# Patient Record
Sex: Female | Born: 1950 | Race: White | Hispanic: No | Marital: Married | Smoking: Never smoker
Health system: Southern US, Community
[De-identification: ages and names within clinical notes are randomized; demographics above are authoritative.]

## PROBLEM LIST (undated history)

## (undated) DIAGNOSIS — R112 Nausea with vomiting, unspecified: Secondary | ICD-10-CM

## (undated) DIAGNOSIS — Z9889 Other specified postprocedural states: Secondary | ICD-10-CM

## (undated) DIAGNOSIS — I10 Essential (primary) hypertension: Secondary | ICD-10-CM

## (undated) DIAGNOSIS — M659 Synovitis and tenosynovitis, unspecified: Secondary | ICD-10-CM

## (undated) DIAGNOSIS — R569 Unspecified convulsions: Secondary | ICD-10-CM

## (undated) DIAGNOSIS — Z972 Presence of dental prosthetic device (complete) (partial): Secondary | ICD-10-CM

## (undated) DIAGNOSIS — T8859XA Other complications of anesthesia, initial encounter: Secondary | ICD-10-CM

## (undated) DIAGNOSIS — G932 Benign intracranial hypertension: Secondary | ICD-10-CM

## (undated) DIAGNOSIS — D649 Anemia, unspecified: Secondary | ICD-10-CM

## (undated) DIAGNOSIS — T4145XA Adverse effect of unspecified anesthetic, initial encounter: Secondary | ICD-10-CM

## (undated) DIAGNOSIS — K219 Gastro-esophageal reflux disease without esophagitis: Secondary | ICD-10-CM

## (undated) DIAGNOSIS — Z8489 Family history of other specified conditions: Secondary | ICD-10-CM

## (undated) DIAGNOSIS — G629 Polyneuropathy, unspecified: Secondary | ICD-10-CM

## (undated) DIAGNOSIS — R51 Headache: Secondary | ICD-10-CM

## (undated) DIAGNOSIS — E7800 Pure hypercholesterolemia, unspecified: Secondary | ICD-10-CM

## (undated) HISTORY — DX: Gastro-esophageal reflux disease without esophagitis: K21.9

## (undated) HISTORY — DX: Essential (primary) hypertension: I10

## (undated) HISTORY — DX: Polyneuropathy, unspecified: G62.9

## (undated) HISTORY — PX: LUMBAR PERITONEAL SHUNT: SHX1984

## (undated) HISTORY — PX: APPENDECTOMY: SHX54

## (undated) HISTORY — PX: BLADDER SUSPENSION: SHX72

## (undated) HISTORY — DX: Presence of dental prosthetic device (complete) (partial): Z97.2

## (undated) HISTORY — PX: ESOPHAGEAL DILATION: SHX303

## (undated) HISTORY — PX: OVARIAN CYST REMOVAL: SHX89

## (undated) HISTORY — DX: Benign intracranial hypertension: G93.2

## (undated) HISTORY — DX: Headache: R51

---

## 1992-10-19 HISTORY — PX: TOTAL ABDOMINAL HYSTERECTOMY W/ BILATERAL SALPINGOOPHORECTOMY: SHX83

## 1999-07-23 ENCOUNTER — Encounter: Admission: RE | Admit: 1999-07-23 | Discharge: 1999-10-21 | Payer: Self-pay | Admitting: Geriatric Medicine

## 2000-02-06 ENCOUNTER — Encounter: Admission: RE | Admit: 2000-02-06 | Discharge: 2000-02-06 | Payer: Self-pay | Admitting: Geriatric Medicine

## 2000-02-06 ENCOUNTER — Encounter: Payer: Self-pay | Admitting: Geriatric Medicine

## 2000-06-16 ENCOUNTER — Encounter: Payer: Self-pay | Admitting: Geriatric Medicine

## 2000-06-16 ENCOUNTER — Encounter: Admission: RE | Admit: 2000-06-16 | Discharge: 2000-06-16 | Payer: Self-pay | Admitting: Geriatric Medicine

## 2000-06-17 ENCOUNTER — Other Ambulatory Visit: Admission: RE | Admit: 2000-06-17 | Discharge: 2000-06-17 | Payer: Self-pay | Admitting: *Deleted

## 2000-06-17 ENCOUNTER — Encounter: Payer: Self-pay | Admitting: Geriatric Medicine

## 2000-06-17 ENCOUNTER — Ambulatory Visit (HOSPITAL_COMMUNITY): Admission: RE | Admit: 2000-06-17 | Discharge: 2000-06-17 | Payer: Self-pay | Admitting: Geriatric Medicine

## 2000-10-14 ENCOUNTER — Encounter: Admission: RE | Admit: 2000-10-14 | Discharge: 2000-10-14 | Payer: Self-pay | Admitting: Geriatric Medicine

## 2000-10-14 ENCOUNTER — Encounter: Payer: Self-pay | Admitting: Geriatric Medicine

## 2000-12-28 ENCOUNTER — Encounter: Admission: RE | Admit: 2000-12-28 | Discharge: 2000-12-28 | Payer: Self-pay | Admitting: Geriatric Medicine

## 2000-12-28 ENCOUNTER — Encounter: Payer: Self-pay | Admitting: Geriatric Medicine

## 2001-02-10 ENCOUNTER — Inpatient Hospital Stay (HOSPITAL_COMMUNITY): Admission: AD | Admit: 2001-02-10 | Discharge: 2001-02-11 | Payer: Self-pay | Admitting: Geriatric Medicine

## 2001-02-10 ENCOUNTER — Encounter: Payer: Self-pay | Admitting: Internal Medicine

## 2001-02-11 ENCOUNTER — Encounter: Payer: Self-pay | Admitting: Geriatric Medicine

## 2001-12-22 ENCOUNTER — Encounter: Payer: Self-pay | Admitting: Geriatric Medicine

## 2001-12-22 ENCOUNTER — Ambulatory Visit (HOSPITAL_COMMUNITY): Admission: RE | Admit: 2001-12-22 | Discharge: 2001-12-22 | Payer: Self-pay | Admitting: Geriatric Medicine

## 2002-04-05 ENCOUNTER — Encounter: Payer: Self-pay | Admitting: Emergency Medicine

## 2002-04-05 ENCOUNTER — Emergency Department (HOSPITAL_COMMUNITY): Admission: EM | Admit: 2002-04-05 | Discharge: 2002-04-05 | Payer: Self-pay | Admitting: Emergency Medicine

## 2002-05-24 ENCOUNTER — Ambulatory Visit (HOSPITAL_BASED_OUTPATIENT_CLINIC_OR_DEPARTMENT_OTHER): Admission: RE | Admit: 2002-05-24 | Discharge: 2002-05-24 | Payer: Self-pay | Admitting: Orthopedic Surgery

## 2002-07-11 ENCOUNTER — Encounter: Admission: RE | Admit: 2002-07-11 | Discharge: 2002-07-11 | Payer: Self-pay | Admitting: *Deleted

## 2002-07-11 ENCOUNTER — Encounter: Payer: Self-pay | Admitting: *Deleted

## 2002-10-10 ENCOUNTER — Encounter: Payer: Self-pay | Admitting: *Deleted

## 2002-10-10 ENCOUNTER — Encounter: Admission: RE | Admit: 2002-10-10 | Discharge: 2002-10-10 | Payer: Self-pay | Admitting: *Deleted

## 2003-04-20 ENCOUNTER — Encounter: Payer: Self-pay | Admitting: Geriatric Medicine

## 2003-04-20 ENCOUNTER — Inpatient Hospital Stay (HOSPITAL_COMMUNITY): Admission: AD | Admit: 2003-04-20 | Discharge: 2003-04-21 | Payer: Self-pay | Admitting: Internal Medicine

## 2003-04-21 ENCOUNTER — Encounter: Payer: Self-pay | Admitting: Geriatric Medicine

## 2003-07-12 ENCOUNTER — Other Ambulatory Visit: Admission: RE | Admit: 2003-07-12 | Discharge: 2003-07-12 | Payer: Self-pay | Admitting: *Deleted

## 2003-08-10 ENCOUNTER — Encounter: Admission: RE | Admit: 2003-08-10 | Discharge: 2003-08-10 | Payer: Self-pay | Admitting: Geriatric Medicine

## 2003-08-10 ENCOUNTER — Encounter: Payer: Self-pay | Admitting: Geriatric Medicine

## 2004-04-06 ENCOUNTER — Encounter: Admission: RE | Admit: 2004-04-06 | Discharge: 2004-04-06 | Payer: Self-pay | Admitting: Geriatric Medicine

## 2004-08-05 ENCOUNTER — Ambulatory Visit (HOSPITAL_COMMUNITY): Admission: RE | Admit: 2004-08-05 | Discharge: 2004-08-05 | Payer: Self-pay | Admitting: Gastroenterology

## 2004-08-11 ENCOUNTER — Encounter: Admission: RE | Admit: 2004-08-11 | Discharge: 2004-08-11 | Payer: Self-pay | Admitting: Geriatric Medicine

## 2004-08-19 ENCOUNTER — Encounter: Admission: RE | Admit: 2004-08-19 | Discharge: 2004-08-19 | Payer: Self-pay | Admitting: *Deleted

## 2004-08-19 ENCOUNTER — Ambulatory Visit (HOSPITAL_COMMUNITY): Admission: RE | Admit: 2004-08-19 | Discharge: 2004-08-19 | Payer: Self-pay | Admitting: Gastroenterology

## 2004-09-01 ENCOUNTER — Encounter: Admission: RE | Admit: 2004-09-01 | Discharge: 2004-09-01 | Payer: Self-pay | Admitting: *Deleted

## 2005-06-29 ENCOUNTER — Other Ambulatory Visit: Admission: RE | Admit: 2005-06-29 | Discharge: 2005-06-29 | Payer: Self-pay | Admitting: Obstetrics and Gynecology

## 2005-10-19 HISTORY — PX: SHUNT REVISION: SHX343

## 2006-05-28 ENCOUNTER — Encounter: Admission: RE | Admit: 2006-05-28 | Discharge: 2006-05-28 | Payer: Self-pay | Admitting: Geriatric Medicine

## 2006-06-09 ENCOUNTER — Encounter: Admission: RE | Admit: 2006-06-09 | Discharge: 2006-06-09 | Payer: Self-pay | Admitting: Gastroenterology

## 2006-06-23 ENCOUNTER — Ambulatory Visit (HOSPITAL_COMMUNITY): Admission: RE | Admit: 2006-06-23 | Discharge: 2006-06-23 | Payer: Self-pay | Admitting: Gastroenterology

## 2007-05-31 ENCOUNTER — Encounter: Admission: RE | Admit: 2007-05-31 | Discharge: 2007-05-31 | Payer: Self-pay | Admitting: Geriatric Medicine

## 2007-08-01 ENCOUNTER — Encounter: Admission: RE | Admit: 2007-08-01 | Discharge: 2007-08-01 | Payer: Self-pay | Admitting: Gastroenterology

## 2007-09-05 ENCOUNTER — Ambulatory Visit (HOSPITAL_COMMUNITY): Admission: RE | Admit: 2007-09-05 | Discharge: 2007-09-05 | Payer: Self-pay | Admitting: Geriatric Medicine

## 2008-06-10 ENCOUNTER — Encounter: Admission: RE | Admit: 2008-06-10 | Discharge: 2008-06-10 | Payer: Self-pay | Admitting: Geriatric Medicine

## 2010-07-09 ENCOUNTER — Encounter: Admission: RE | Admit: 2010-07-09 | Discharge: 2010-07-09 | Payer: Self-pay | Admitting: Geriatric Medicine

## 2010-10-07 ENCOUNTER — Ambulatory Visit (HOSPITAL_COMMUNITY)
Admission: RE | Admit: 2010-10-07 | Discharge: 2010-10-07 | Payer: Self-pay | Source: Home / Self Care | Attending: Geriatric Medicine | Admitting: Geriatric Medicine

## 2011-03-06 NOTE — H&P (Signed)
[place]. [facility]  Patient:    [name], [name]                      MRN: [id number] Adm. Date:  02/10/01 Attending:  Thora Lance, M.D. Dictator:   Tillman Sers, N.P.                         History and Physical  DATE OF BIRTH:  [birth year].  CHIEF COMPLAINT:  Abdominal pain, nausea and vomiting.  HISTORY OF PRESENT ILLNESS:  [name] is a 60 year old white female patient of Dr. Ann Maki T. King who presents with a 3-1/2 day history of nausea, vomiting and abdominal pain. The pain is described as crampy, starting on the whole right side of the abdomen and extending to the left. It is not associated with any particular food. She has had no change in bowel movements. When she was seen in our office two days ago a KUB and lab work was unremarkable. She was advised to stay on clear liquids, use Levbid and Phenergan. It was felt that she had a viral gastroenteritis with spasms. The symptoms of nausea and vomiting, however, have worsened over the last 24 hours to the point that she has had no p.o. intake. Abdominal pain has pretty much stayed the same.  In the office she was found to be acutely tender in the abdomen on exam. Due to her symptoms and findings as well as complex medical history, she is being admitted for the same.  REVIEW OF SYSTEMS:  GENERAL:  She has had no fever or chills.  CHEST:  No pain, pressure or shortness of breath. ABDOMEN:  As above. GU:  No dysuria, frequency or hematuria. No change in bowel movements. PERIPHERAL:  No edema. NEUROLOGIC:  No headache, vision disturbances, numbness or any stroke symptoms.  PAST MEDICAL HISTORY: 1. Diabetes mellitus on oral agent. 2. Pseudotumor cerebri with peritoneal shunt. 3. Diverticulosis. 4. Urinary incontinence. 5. Obesity. 6. Severe GERD. 7. Hypertension.  PAST SURGICAL HISTORY: 1. Laparoscopic abdominal procedures for adhesions September 1992. 2. Appendectomy. 3. Ovarian  cyst removal. 4. TAH and BSO by Dr. Elliot Gault in 1994. 5. Bladder suspension procedure by Dr. Logan Bores in 1996. 6. Initial placement of CNS peritoneal shunt in summer of 1999    but had to redone due to adhesions.  FAMILY HISTORY:  Mother is 4 years of age and has multiple medical problems including DM, heart disease, hypertension, end stage Parkinsons and coronary artery disease. Her father of an MI at age 71.  SOCIAL HISTORY:  She is married and does not smoke. She rarely drinks ETOH.  CURRENT MEDICATIONS: 1. Prevacid 30 mg one a day. 2. Actos 30 mg one a day. 3. Glucotrol XL 10 mg one a day. 4. Norvasc 5 mg one a day. 5. Ortho-Est one a day. 6. Colace one p.o. q.d. p.r.n.  ALLERGIES:  CODEINE causes nausea, vomiting and hallucinations, PENICILLIN causes rash, TEGRETOL causes rash, ACCUPRIL also causes rash.  PHYSICAL EXAMINATION:  VITAL SIGNS:  Weight 187 which is stable, temperature 96.8, pulse 92, respirations 20, blood pressure 100/70. He is not orthostatic.  GENERAL:  She does appear mildly ill.  HEENT:  Eyes:  Funduscopic exam difficult. PERRLA. EOMs intact. Ear, nose and throat unremarkable.  NECK:  No JVD, no carotid bruit. Thyroid gland is not enlarged. No masses or irregularities palpated.  CARDIOVASCULAR:  Regular rate and rhythm without murmur or S3.  LUNGS:  Breath sounds are clear throughout.  ABDOMEN:  Soft, slightly distended. She is tender pretty much all over, worse in the right mid to lower quadrant. There is no rebound or guarding.  RECTAL:  Normal sphincter tone, stool is brown and heme negative.  NEUROLOGIC:  Cranial nerves II-XII intact. Gait steady. Speech appropriate. Nonfocal.  LABORATORY AND ACCESSORY DATA:  February 08, 2001:  Amylase 54, lipase 9.6, glucose 162, BUN 13.0, creatinine 0.6, sodium 140, potassium 3.9, chloride 98, CO2 29.0, calcium 9.3, protein 7.8, albumin 3.4, total bilirubin 0.3, alkaline phosphatase 210, SGOT 14, SGPT 32.  Hemoglobin 13.9, hematocrit 38.5, RBCs 4.76, MCV 91.0, RDW 36.0, platelet count 477,000 and white blood cell count 10.5 with neutrophils 84.2, lymphocytes 11.3.  KUB was unremarkable.  IMPRESSION:   Abdominal pain, nausea and vomiting. Complex medical history includes numerous abdominal surgeries.  PLAN:  Admit, n.p.o. status, IV hydration. CT scan of abdomen and pelvis STAT. Dr. Pete Glatter and associates to follow. DD:  02/10/01 TD:  02/11/01 Job: [id number]

## 2011-03-06 NOTE — H&P (Signed)
[place]. [facility]  Patient:    [name], [name]                      MRN: [id number] Adm. Date:  02/10/01 Attending:  Thora Lance, M.D. Dictator:   Tillman Sers, N.P.                         History and Physical  DATE OF BIRTH:  [birth year]  CHIEF COMPLAINT:  Abdominal pain, nausea and vomiting.  HISTORY OF PRESENT ILLNESS:  [name] is a 60 year old white female patient of Dr. Alice Rieger who presents with a 3-1/2-day history of nausea, vomiting, and abdominal pain.  The pain is described as crampy, starting on the whole right side of the abdomen and extending to the left.  It is not associated with any particular food.  She has had no change in bowel movements.  When she was seen in our office two days ago, a KUB and lab work were unremarkable. She was advised to stay on clear liquids, use Levbid and Phenergan.  It was felt she had a viral gastroenteritis and spasms.  Symptoms of nausea and vomiting, however, have worsened over the last 24 hours, to the point that she has had no p.o. intake.  Abdominal pain has pretty much stayed the same.  In the office, she was found to be acutely tender in the abdomen on exam.  Due to her symptoms and findings, as well as complex medical history, she is being admitted for the same.  REVIEW OF SYSTEMS:  GENERAL:  She has had no fever or chills.  CHEST:  No pain, pressure, or shortness of breath.  ABDOMEN:  As above.  GU:  No dysuria, frequency, hematuria.  No change in bowel movements.  PERIPHERAL:  No edema. NEUROLOGIC:  No headache, visual disturbances, numbness, or any stroke symptoms.  PAST MEDICAL HISTORY: 1. Diabetes mellitus, on oral agents. 2. Pseudotumor cerebri with peritoneal shunt. 3. Diverticulosis. 4. Urinary incontinence. 5. Obesity. 6. Severe GERD. 7. Hypertension.  PAST SURGICAL HISTORY:  Laparoscopic abdominal procedures for adhesions. September 1992 appendectomy.  Ovarian cyst  removed.  A TAH and BSO by Dr. Elliot Gault in 1994.  Bladder suspension procedure by Dr. Amil Amen in 1996. Initial placement of CNS peritoneal shunt in the summer of 1999 but had to be redone due to adhesions.  FAMILY HISTORY:  Mother is 68 years of age and has multiple medical problems including DM, heart disease, hypertension, end-stage Parkinsons, and coronary artery disease.  Her father died of an MI at age 54.  SOCIAL HISTORY:  She is married.  She does not smoke.  Rarely drinks ETOH.  MEDICATIONS: 1. Prevacid 30 mg 1 a day. 2. Actos 30 mg 1 a day. 3. Glucotrol XL 10 mg 1 a day. 4. Norvasc 5 mg 1 a day. 5. Ortho-Est 1 a day. 6. Colace 1 p.o. q.d. p.r.n.  ALLERGIES: 1. CODEINE causes nausea, vomiting, and hallucinations. 2. PENICILLIN causes rash. 3. TEGRETOL causes rash. 4. ACCUPRIL also causes rash.  PHYSICAL EXAMINATION:  VITAL SIGNS:  Weight 187 pounds, which is stable.  Temperature 96.8, pulse 92, respirations 20, blood pressure 100/70.  She is not orthostatic.  She does appear mildly ill.  HEENT:  Eyes:  Funduscopic exam is difficult.  PERRLA, EOMs intact.  Ear, nose, and throat:  Unremarkable.  NECK:  No JVD.  No carotid bruits.  Thyroid gland is not enlarged.  No masses or irregularities palpated.  HEART:  Regular rate and rhythm without murmur or S3.  LUNGS:  Breath sounds were clear throughout.  ABDOMEN:  Soft, slightly distended.  She is tender pretty much all over, worse in the right mid to lower quadrant.  There is no rebounding or guarding.  RECTAL:  Normal sphincter tone.  Stool is brown and heme negative.  NEUROLOGIC:  Cranial nerves II-XII intact.  Gait steady.  Speech appropriate. Nonfocal.  LABORATORY DATA:  April 23:  Amylase 54, lipase 9.6, glucose 162, BUN 13, creatinine 0.6, sodium 140, potassium 3.9, chloride 98, CO2 29, calcium 9.3, protein 7.8, albumin 3.4, total bilirubin 0.3, alkaline phosphatase 210, SGOT 14, SGPT 32.  Hemoglobin 13.9,  hematocrit 38.5, RBC 4.76, MCV 81, RDW 36, platelets 477,000, WBC 10,500 with neutrophils 84.2%, lymphocytes 11.3%.  KUB was unremarkable.  IMPRESSION:  Abdominal pain, nausea, and vomiting.  Complex medical history includes numerous abdominal surgeries.  PLAN:  Admit, n.p.o. status, IV hydration.  CT scan of the abdomen and pelvis stat.  Dr. Pete Glatter and associates to follow. DD:  02/10/01 TD:  02/11/01 Job: [id number]

## 2011-03-06 NOTE — Discharge Summary (Signed)
[place]. [facility]  Patient:    [name], [name]                    MRN: [id number] Adm. Date:  [id number] Disc. Date: [id number] Attending:  Ginette Otto                           Discharge Summary  ADMISSION DIAGNOSIS:  Abdominal pain, nausea, and vomiting.  DISCHARGE DIAGNOSES: 1. Constipation. 2. Diabetes mellitus. 3. Pseudotumor cerebri with peritoneal shunt. 4. Diverticulosis. 5. Urinary incontinence. 6. Obesity. 7. Gastroesophageal reflux disease. 8. Hypertension.  BRIEF HISTORY:  [name] is a very nice, 60 year old, white female who had been followed as an outpatient with a three and a half year history of nausea, vomiting, and abdominal pain.  She has had a crampy pain starting on the right side of her abdomen and then extending over to the left.  She has had KUB and laboratory work which has been normal.  She has been on a clear liquid diet. Over the last 24 hours, she has not been able to take in nutrition or liquids. The abdominal pain has been persistent.  PHYSICAL EXAMINATION:  Weight 187 pounds.  VITAL SIGNS:  Temperature 96.8 degrees, pulse 92, blood pressure 100/70.  HEENT:  Unremarkable.  LUNGS:  Clear.  HEART:  Regular rate and rhythm without murmur.  ABDOMEN:  Soft.  Slightly distended.  Tender diffusely, worse in the right mid to right lower quadrant.  LABORATORY DATA ON ADMISSION:  White blood cell count 11,700, hemoglobin 13.6. Pro time 12.5.  Sodium 141, potassium 3.9, chloride 104, bicarbonate 26, glucose 120, BUN 16, creatinine 0.7, calcium 9.3, total protein 7.0, albumin 3.6, AST 34, ALT 31, ALP 152, total bilirubin 1.2, amylase 55, lipase 15. Urinalysis with greater than 80 ketones, negative protein, nitrite negative.  HOSPITAL COURSE:  The patient was admitted to a regular hospital bed.  A KUB revealed obstipation and fecal impaction.  She was placed on an NG tube to low intermittent suction.  She  was given a Fleets enema.  Overnight she had significant results with virtual resolution of her nausea, vomiting, and abdominal pain.  She was then started on a regular diet and discharged later that afternoon.  CONDITION ON DISCHARGE:  Improved.  MEDICATIONS AT DISCHARGE: 1. Prevacid 30 mg a day. 2. Actos 30 mg a day. 3. Glucotrol XL 10 mg a day. 4. Norvasc 5 mg a day. 5. Ortho-Est one a day. 6. Colace 100 mg a day. 7. Milk of magnesia two tablespoons every day.  FOLLOW-UP:  She will be seen in the office in approximately three to four weeks. DD:  02/22/01 TD:  02/22/01 Job: [id number]

## 2011-03-06 NOTE — Op Note (Signed)
NAMEKENNETHA, PEARMAN             ACCOUNT NO.:  [id number]   MEDICAL RECORD NO.:  [id number]          PATIENT TYPE:  AMB   LOCATION:  ENDO                         FACILITY:  [facility]   PHYSICIAN:  Danise Edge, M.D.   DATE OF BIRTH:  Sep 16, 1951   DATE OF PROCEDURE:  08/05/2004  DATE OF DISCHARGE:                                 OPERATIVE REPORT   PROCEDURE:  Savary esophageal dilation.   PROCEDURE INDICATION:  Ms. [name] is a 60 year old female, born  10-Nov-1950.  [name] has chronic gastroesophageal reflux and  intermittently takes a proton pump inhibitor.  On July 09, 1999, she  underwent a barium swallow x-ray which revealed marked, spontaneous  gastroesophageal reflux to the level of the cricopharyngeus associated with  a small, reducible hiatal hernia.   [name] now describes episodes of dysphagia to both liquids and solids.  She experiences swallow-associated cough suggesting tracheal penetration as  well as esophageal dysphagia with regurgitation of recently swallowed food  or liquid with mucus.  Her symptoms have been present for about 3 months.  She has lost 20 pounds as a result of her swallowing difficulty.   MEDICATION ALLERGIES:  1.  CODEINE.  2.  PENICILLIN.  3.  TEGRETOL.  4.  ACCUPRIL.  5.  LIPITOR.  6.  ZETIA.  7.  ZOCOR.  8.  PRAVACHOL.  9.  CRESTOR.   PAST MEDICAL HISTORY:  1.  Pseudotumor cerebri.  2.  Ventriculoperitoneal shunt.  3.  Urinary incontinence.  4.  Colonic diverticulosis by flexible proctosigmoidoscopy - barium enema in      1999.  5.  Type 2 diabetes mellitus.  6.  Hypercholesterolemia.  7.  Hypertension.  8.  Appendectomy.  9.  Ovarian cystectomy.  10. Laparoscopic adhesion lysis.  11. Total abdominal hysterectomy with bilateral salpingo-oophorectomy.  12. Bladder suspension surgery.   FAMILY HISTORY:  Negative for colon cancer.   HABITS:  [name] does not smoke cigarettes and rarely consumes  alcohol.   LABORATORY DATA:  May 30, 2004, hemoglobin A1c 7%.  Complete metabolic  profile normal.  Urinalysis normal.   ENDOSCOPIST:  Danise Edge, MD   PREMEDICATION:  1.  Versed 7.5 mg.  2.  Demerol 50 mg.   DESCRIPTION OF PROCEDURE:  After obtaining informed consent, [name] was  placed in the left lateral decubitus position.  I administered intravenous  Demerol and intravenous Versed to achieve conscious sedation for the  procedure.  The patient's blood pressure, oxygen saturation, and cardiac  rhythm were monitored throughout the procedure and documented in the medical  record.   The Olympus gastroscope was passed through the posterior hypopharynx into  the proximal esophagus without difficulty.  The hypopharynx and larynx  appeared normal.  I did not get a good look at the vocal cords.   ESOPHAGOSCOPY:  The proximal and mid segments of the esophagus appear  normal.  There is a shallow Schatzki's ring at the esophagogastric junction.  There is no endoscopic evidence for the presence of esophageal erosions or  Barrett's esophagus.   GASTROSCOPY:  Retroflexed view of the  gastric cardia and fundus was normal.  The gastric body, antrum, and pylorus appeared normal.   DUODENOSCOPY:  The duodenal bulb and descending duodenum appear normal.   SAVARY ESOPHAGEAL DILATION:  The Savary dilator wire was passed through the  endoscope and the tip of the guidewire advanced to the distal gastric  antrum.  Under fluoroscopic guidance, the 15 mm Savary dilator passed  without resistance.  Repeat esophagogastroscopy confirmed satisfactory  dilation of the shallow Schatzki's ring at the esophagogastric junction and  no gastric trauma due to the guidewire.   ASSESSMENT:  A shallow Schatzki's ring was dilated at the esophagogastric  junction; otherwise, normal esophagogastroduodenoscopy.   RECOMMENDATIONS:  I do not think the dilated shallow Schatzki's ring at the  esophagogastric  junction completely explains [name]'s swallowing  difficulty.  I remain concerned that she may have transfer dysphagia  associated with tracheal penetration of swallowed liquids.  I will arrange a  swallowing study through speech pathology at [facility].      MJ/MEDQ  D:  08/05/2004  T:  08/05/2004  Job:  [id number]   cc:   Hal T. Stoneking, M.D.  [address] [place], [place] [address]  Fax: [phone]

## 2011-03-06 NOTE — H&P (Signed)
[name], [name]                       ACCOUNT NO.:  [id number]   MEDICAL RECORD NO.:  [id number]                   PATIENT TYPE:  INP   LOCATION:  4738                                 FACILITY:  [facility]   PHYSICIAN:  Jackie Plum, M.D.             DATE OF BIRTH:  April 21, 1951   DATE OF ADMISSION:  04/20/2003  DATE OF DISCHARGE:                                HISTORY & PHYSICAL   CHIEF COMPLAINT:  Chest pain centrally, associated with right arm pain and  tingling.  Also has cough, wheezing, and dizziness.   HISTORY OF PRESENT ILLNESS:  After usual housework Wednesday morning, which  was vacuuming, making beds, and folding laundry, she developed right arm  tingling.  While walking about the house, developed crushing chest pain  centrally associated with cough, wheezing, shortness of breath, dizziness,  some sweats, and nausea.  She produced sputum that was white.  She has no  fever or chills.  She rested on the couch the rest to the evening.  The  chest pain persisted though was somewhat better.  She reported today for  further workup.   EKG changes in lead III led to the decision to admit to rule out MI.   PAST MEDICAL HISTORY:  1. Pseudotumor cerebri July 1999.  2. Fibrocystic breast disease.  3. Diabetes.  4. Hypertension.  5. Diverticulosis with flexible sigmoidoscopy February 1999.  6. Syncope with negative workup resulting in diagnosis of pseudotumor     cerebri.   MEDICATIONS:  1. Actos 30 mg 1 daily.  2. Prevacid 30 mg 1 daily.  3. Glucotrol XL 10 mg 1 daily.  4. Norvasc 5 mg 1 daily.  5. Ortho-Est 1 daily.  6. Colace 1 daily.  7. Glucophage XR 1000 mg 2 q.h.s.  8. Zetia 10 mg 1 daily.   ALLERGIES:  CODEINE, PENICILLIN, TEGRETOL, and ACCUPRIL.   PAST SURGICAL HISTORY:  1. Appendectomy.  2. Ovarian cyst.  3. Laparoscopic abdominal procedure for adhesions September 1992.  4. Total abdominal hysterectomy and bilateral salpingo-oophorectomy by Dr.  Elliot Gault in 1994.  5. Bladder suspension procedure, Dr. Logan Bores in 1996.  6. Placement of CNS peritoneal shunt summer of 1999  7. Redone due to adhesions.   FAMILY HISTORY:  Mother died at age 58 of end-stage Parkinson's disease.  She also had diabetes, hypertension, and heart disease.  Father died at age  48 of an MI.  One sister is in good health at age 94.  One brother, age 40,  has high blood pressure.  She has two children in good health.   SOCIAL HISTORY:  She is married.  Denies smoking.  Rare alcohol.  She is  disabled from [employer].   REVIEW OF SYSTEMS:  See Chief Complaint.   PHYSICAL EXAMINATION:  GENERAL:  Obese white female in no acute distress.  VITAL SIGNS:  Weight not done.  Temperature 97.3, pulse 72, blood pressure  130/90  with large cuff.  Pulse oximetry 95%.  SKIN:  Normal.  HEENT:  Eyes: PERRLA. Ears clear.  Nose and throat clear.  She is  edentulous.  NECK:  Supple with no thyroid enlargement or tenderness.  CHEST:  Clear to auscultation but tender to palpation centrally.  HEART:  Normal S1 and S2 with no murmurs, rubs, gallops, or clicks.  ABDOMEN:  Tender in the epigastric area.  She has positive bowel sounds  throughout.  No masses or organomegaly.  GYN/RECTAL:  Not indicated.  EXTREMITIES:  No edema, cyanosis, or clubbing.   LABORATORY DATA:  Includes EKG with changes in lead III.   ASSESSMENT:  Chest pain; however, rule out myocardial infarction.   PLAN:  Admit for further evaluation and treatment.  Consult with Dr. Merlene Laughter.     Lavinia Sharps, N.P.                     Jackie Plum, M.D.    MAP/MEDQ  D:  04/20/2003  T:  04/21/2003  Job:  [id number]

## 2011-03-06 NOTE — Op Note (Signed)
[name], [name]             ACCOUNT NO.:  [id number]   MEDICAL RECORD NO.:  [id number]          PATIENT TYPE:  AMB   LOCATION:  ENDO                         FACILITY:  [facility]   PHYSICIAN:  Danise Edge, M.D.   DATE OF BIRTH:  05-Aug-1951   DATE OF PROCEDURE:  06/23/2006  DATE OF DISCHARGE:                                 OPERATIVE REPORT   SAVARY ESOPHAGEAL DILATION REPORT:   PROCEDURE INDICATION:  [name] is a 60 year old female, born  10-21-1950.  [name] has a symptomatic Schatzki's ring at the  esophagogastric junction, and is scheduled for repeat esophageal dilation.  She last underwent dilation to 15-mm Savary on August 05, 2004.   PAST MEDICAL HISTORY:  1. Pseudotumor cerebri.  2. Hypertension.  3. Ventriculoperitoneal shunt.  4. Urinary incontinence.  5. Colonic diverticulosis by flexible proctosigmoidoscopy - barium enema      1999.  6. Type 2 diabetes mellitus.  7. Hypercholesterolemia.  8. Appendectomy.  9. Ovarian cystectomy.  10.Laparoscopic adhesion lysis.  11.Total abdominal hysterectomy with bilateral salpingo-oophorectomy.  12.Bladder suspension surgery.   FAMILY HISTORY:  Negative for colon cancer.   HABITS:  [name] does not smoke cigarettes and rarely consumes alcohol.   ENDOSCOPIST:  Dr. Reece Agar.   PREMEDICATION:  Versed 7 mg, Demerol 75 mcg.   PROCEDURE:  After obtaining informed consent, [name] was placed in the  left lateral decubitus position.  I administered intravenous fentanyl and  intravenous Versed to achieve conscious sedation for the procedure.  The  patient's blood pressure, oxygen saturation and cardiac rhythm were  monitored throughout the procedure and documented in the medical record.   The Olympus gastroscope was passed through the posterior hypopharynx and  into the proximal esophagus without difficulty.  The hypopharynx, larynx and  vocal cords appeared normal.   ESOPHAGOSCOPY:  The  proximal, mid and lower segments of the esophageal  mucosa appeared normal.  There is a Schatzki's ring at the esophagogastric  junction, and a moderate-sized hiatal hernia distally.  There is no  endoscopic evidence for the presence of erosive esophagitis or Barrett's  esophagus.   GASTROSCOPY:  Retroflex view of the gastric cardia and fundus was normal.  The diaphragmatic hiatus is only slightly patulous.  The gastric body,  antrum and pylorus appeared normal.   DUODENOSCOPY:  The duodenal bulb and second portion of the duodenum appear  normal.   SAVARY ESOPHAGEAL DILATION:  The Savary dilator wire was passed through the  gastroscope, and tip of the guidewire advanced to the distal gastric antrum  as confirmed endoscopically.  Fluoroscopy was not required for the procedure  today.  The 16-mm Savary dilator passed without resistance.  Repeat  esophagogastrostomy confirmed satisfactory dilation of the Schatzki's ring  at the esophagogastric junction, and no gastric trauma due to the guidewire.   ASSESSMENT:  Schatzki's ring at the esophagogastric junction associated with  a moderate-sized hiatal hernia, dilated with the 16-mm Savary dilator.           ______________________________  Danise Edge, M.D.     MJ/MEDQ  D:  06/23/2006  T:  06/23/2006  Job:  [id number]   cc:   Hal T. Stoneking, M.D.

## 2011-03-16 ENCOUNTER — Other Ambulatory Visit (HOSPITAL_BASED_OUTPATIENT_CLINIC_OR_DEPARTMENT_OTHER): Payer: Self-pay | Admitting: Family Medicine

## 2011-03-17 ENCOUNTER — Ambulatory Visit
Admission: RE | Admit: 2011-03-17 | Discharge: 2011-03-17 | Disposition: A | Discharge status: Home / Self Care | Payer: Managed Care, Other (non HMO) | Source: Ambulatory Visit | Attending: Internal Medicine | Admitting: Internal Medicine

## 2011-03-17 ENCOUNTER — Other Ambulatory Visit: Payer: Self-pay | Admitting: Internal Medicine

## 2011-03-17 DIAGNOSIS — R319 Hematuria, unspecified: Secondary | ICD-10-CM

## 2011-04-13 ENCOUNTER — Other Ambulatory Visit: Payer: Self-pay | Admitting: Geriatric Medicine

## 2011-04-13 DIAGNOSIS — Z1231 Encounter for screening mammogram for malignant neoplasm of breast: Secondary | ICD-10-CM

## 2011-04-20 ENCOUNTER — Other Ambulatory Visit: Payer: Self-pay | Admitting: Geriatric Medicine

## 2011-04-20 DIAGNOSIS — N6009 Solitary cyst of unspecified breast: Secondary | ICD-10-CM

## 2011-04-21 ENCOUNTER — Other Ambulatory Visit: Payer: Self-pay | Admitting: Geriatric Medicine

## 2011-04-21 ENCOUNTER — Ambulatory Visit
Admission: RE | Admit: 2011-04-21 | Discharge: 2011-04-21 | Disposition: A | Discharge status: Home / Self Care | Payer: Managed Care, Other (non HMO) | Source: Ambulatory Visit | Attending: Geriatric Medicine | Admitting: Geriatric Medicine

## 2011-04-21 DIAGNOSIS — N6009 Solitary cyst of unspecified breast: Secondary | ICD-10-CM

## 2011-04-24 ENCOUNTER — Other Ambulatory Visit: Payer: Self-pay | Admitting: Diagnostic Radiology

## 2011-04-24 ENCOUNTER — Other Ambulatory Visit: Payer: Self-pay | Admitting: Geriatric Medicine

## 2011-04-24 ENCOUNTER — Ambulatory Visit
Admission: RE | Admit: 2011-04-24 | Discharge: 2011-04-24 | Disposition: A | Discharge status: Home / Self Care | Payer: Managed Care, Other (non HMO) | Source: Ambulatory Visit | Attending: Geriatric Medicine | Admitting: Geriatric Medicine

## 2011-04-24 DIAGNOSIS — N6009 Solitary cyst of unspecified breast: Secondary | ICD-10-CM

## 2011-04-27 ENCOUNTER — Ambulatory Visit
Admission: RE | Admit: 2011-04-27 | Discharge: 2011-04-27 | Disposition: A | Discharge status: Home / Self Care | Payer: Managed Care, Other (non HMO) | Source: Ambulatory Visit | Attending: Geriatric Medicine | Admitting: Geriatric Medicine

## 2011-04-27 ENCOUNTER — Ambulatory Visit: Payer: Managed Care, Other (non HMO)

## 2011-04-27 DIAGNOSIS — N6009 Solitary cyst of unspecified breast: Secondary | ICD-10-CM

## 2011-05-04 ENCOUNTER — Encounter (INDEPENDENT_AMBULATORY_CARE_PROVIDER_SITE_OTHER): Payer: Self-pay | Admitting: Surgery

## 2011-05-05 ENCOUNTER — Ambulatory Visit (INDEPENDENT_AMBULATORY_CARE_PROVIDER_SITE_OTHER): Payer: Managed Care, Other (non HMO) | Admitting: Surgery

## 2011-05-05 ENCOUNTER — Encounter (INDEPENDENT_AMBULATORY_CARE_PROVIDER_SITE_OTHER): Payer: Self-pay | Admitting: Surgery

## 2011-05-05 VITALS — BP 118/76 | HR 70 | Temp 97.4°F | Ht 62.0 in | Wt 170.0 lb

## 2011-05-05 DIAGNOSIS — N63 Unspecified lump in unspecified breast: Secondary | ICD-10-CM

## 2011-05-05 DIAGNOSIS — N6320 Unspecified lump in the left breast, unspecified quadrant: Secondary | ICD-10-CM | POA: Insufficient documentation

## 2011-05-05 NOTE — Patient Instructions (Signed)
Please call your neurologist (Dr. Lilian Kapur) and discuss your upcoming surgery with him.  If he has any concerns about your surgery and the required general anesthetic, please let us know so we can refer you to [facility].  If there are no concerns, please call my nurse - Pattricia Boss - and let her know so we can schedule your surgery.

## 2011-05-05 NOTE — Progress Notes (Addendum)
Chief Complaint  Patient presents with  . Other    eval Lt br palpilloma    HPI HPIThe patient is a 60 year old female with significant medical problems who last underwent a mammogram in October 2005. 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 Recently she developed a tender red spot on her left breast just above the nipple. Dr. Pete Glatter referred her for mammogram and ultrasound on April 21, 2011. This showed that the skin lesion was confined only to the skin with no deep invasion. A previously seen right breast mass has resolved and the patient only has some mild right duct ectasia. On the left side, she has extensive upper outer quadrant duct ectasia with complex intraductal mass at 12:30. She underwent ultrasound-guided core biopsy of this mass on July 6. 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 A clip was placed. The patient is now referred for surgical evaluation.   Past Medical History  Diagnosis Date  . Diabetes mellitus   . Peripheral neuropathy   . Hypertension   . Hyperlipidemia   . GERD (gastroesophageal reflux disease)   . Fatigue   . Breast pain   . Lump   . Wears dentures   . Kidney disease   . Dizziness   . Headache   Pseudotumor cerebri - This is managed by Dr. Lew Dawes at [facility] neurology. All of her previous neurosurgery was performed at [facility], but no neurosurgeons remain there. Previously she was unable to have surgery here in [place] due to her anesthetic risks.   Past Surgical History  Procedure Date  . Appendectomy   . Bladder tack   . Lumbar peritoneal shunt   . Total abdominal hysterectomy w/ bilateral salpingoophorectomy     Family History  Problem Relation Age of Onset  . Heart disease Father     Social History History  Substance Use Topics  . Smoking status: Never Smoker   . Smokeless  tobacco: Not on file  . Alcohol Use: No    Allergies  Allergen Reactions  . Carbamazepine   . Penicillins   . Statins   . Tricyclic Antidepressants     Current Outpatient Prescriptions  Medication Sig Dispense Refill  . amLODipine (NORVASC) 10 MG tablet Take 5 mg by mouth daily.        Marland Kitchen aspirin 81 MG tablet Take 81 mg by mouth daily.        Marland Kitchen b complex vitamins tablet Take 1 tablet by mouth daily.        . capsicum (ZOSTRIX) 0.075 % topical cream Apply 1 application topically 3 (three) times daily.        . ciprofloxacin (CIPRO) 500 MG/5ML (10%) suspension Take by mouth 2 (two) times daily.        . cloNIDine (CATAPRES) 0.3 MG tablet Take 0.3 mg by mouth 2 (two) times daily.        . cyclobenzaprine (FLEXERIL) 10 MG tablet Take 10 mg by mouth 3 (three) times daily as needed.        . diclofenac (VOLTAREN) 0.1 % ophthalmic solution 1 drop as needed.        . divalproex (DEPAKOTE) 250 MG 24 hr tablet Take 250 mg by mouth Nightly.        . esomeprazole (NEXIUM) 40 MG capsule Take 40 mg by mouth as needed.        Marland Kitchen estradiol (ESTRACE) 1  MG tablet Take 1.5 mg by mouth daily.        Marland Kitchen estropipate (OGEN) 1.5 MG tablet Take 1.5 mg by mouth daily.        . furosemide (LASIX) 20 MG tablet Take 20 mg by mouth daily.        . furosemide (LASIX) 20 MG tablet Take 20 mg by mouth 2 (two) times daily.        Marland Kitchen glipiZIDE (GLUCOTROL) 10 MG tablet Take 10 mg by mouth 2 (two) times daily before a meal.        . HYDROcodone-acetaminophen (VICODIN) 5-500 MG per tablet Take 1 tablet by mouth as needed.        . insulin glargine (LANTUS) 100 UNIT/ML injection Inject 14 Units into the skin at bedtime.        Marland Kitchen ketoconazole (NIZORAL) 2 % cream Apply topically daily.        . metFORMIN (GLUMETZA) 500 MG (MOD) 24 hr tablet Take 1,000 mg by mouth daily with breakfast.        . metoprolol (TOPROL-XL) 100 MG 24 hr tablet Take 100 mg by mouth daily.        . metoprolol (TOPROL-XL) 50 MG 24 hr tablet Take 50 mg by  mouth daily.        . metroNIDAZOLE (FLAGYL) 500 MG tablet Take 500 mg by mouth 3 (three) times daily.        Marland Kitchen omeprazole (PRILOSEC) 20 MG capsule Take 20 mg by mouth daily.        . Tamsulosin HCl (FLOMAX) 0.4 MG CAPS Take 0.4 mg by mouth daily.        Marland Kitchen telmisartan (MICARDIS) 80 MG tablet Take 80 mg by mouth daily.        . traMADol-acetaminophen (ULTRACET) 37.5-325 MG per tablet Take 2 tablets by mouth every 6 (six) hours as needed.          Review of Systems ROS Positive for left breast mass, hypertension, diabetes, hypercholesterolemia, gastro-esophageal reflux, kidney stone, weakness and dizziness related to pseudotumor cerebri, headaches. Otherwise negative. Menarche age 76. First pregnancy 1980. She did breast feed. She has been on hormones for 18 years. She has a paternal aunt that had breast cancer. Physical Exam Physical Exam   Blood pressure 118/76, pulse 70, temperature 97.4 F (36.3 C), height 5\' 2"  (1.575 m), weight 170 lb (77.111 kg). Well-developed well-nourished female in no apparent distress. HEENT: EOMI, sclerae anicteric Neck: No masses, no thyromegaly Lungs: Clear to auscultation bilaterally, normal respiratory effort Heart: Regular rate and rhythm, no murmur Breasts: Right breast shows fibrocystic changes throughout, chronic retraction of the right nipple, no nipple discharge, no dominant masses, no axillary lymphadenopathy. Left breast shows a pink healing skin lesion at the upper age of her areola at 12:00. Healing biopsy site at 1:00. No palpable masses at 12:30 in the area in question. 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 If she has excessive anesthetic risk, we will likely need to refer her to [facility] for her surgical management. If Dr. Lilian Kapur feels that it  would be safe for surgery here, then I would recommend a left needle localized lumpectomy. We will await medical clearance.  Jailan Trimm K. 05/05/2011, 12:05 PM   We received a note from Dr. Rudy Jew. [facility].  He feels that  there is no reason why she cannot have surgery here.  We will schedule her left needle-localized lumpectomy.

## 2011-05-06 ENCOUNTER — Ambulatory Visit (INDEPENDENT_AMBULATORY_CARE_PROVIDER_SITE_OTHER): Payer: Managed Care, Other (non HMO) | Admitting: General Surgery

## 2011-05-07 ENCOUNTER — Other Ambulatory Visit (INDEPENDENT_AMBULATORY_CARE_PROVIDER_SITE_OTHER): Payer: Self-pay | Admitting: Surgery

## 2011-05-07 DIAGNOSIS — N6320 Unspecified lump in the left breast, unspecified quadrant: Secondary | ICD-10-CM

## 2011-05-12 ENCOUNTER — Ambulatory Visit (HOSPITAL_BASED_OUTPATIENT_CLINIC_OR_DEPARTMENT_OTHER): Admission: RE | Admit: 2011-05-12 | Payer: Managed Care, Other (non HMO) | Source: Ambulatory Visit | Admitting: Surgery

## 2011-05-19 ENCOUNTER — Encounter (HOSPITAL_BASED_OUTPATIENT_CLINIC_OR_DEPARTMENT_OTHER)
Admission: RE | Admit: 2011-05-19 | Discharge: 2011-05-19 | Disposition: A | Discharge status: Home / Self Care | Payer: Managed Care, Other (non HMO) | Source: Ambulatory Visit | Attending: Surgery | Admitting: Surgery

## 2011-05-19 LAB — BASIC METABOLIC PANEL
CO2: 25 mEq/L (ref 19–32)
Chloride: 99 mEq/L (ref 96–112)
Glucose, Bld: 163 mg/dL — ABNORMAL HIGH (ref 70–99)
Sodium: 140 mEq/L (ref 135–145)

## 2011-05-22 ENCOUNTER — Other Ambulatory Visit (INDEPENDENT_AMBULATORY_CARE_PROVIDER_SITE_OTHER): Payer: Self-pay | Admitting: Surgery

## 2011-05-22 ENCOUNTER — Ambulatory Visit (HOSPITAL_BASED_OUTPATIENT_CLINIC_OR_DEPARTMENT_OTHER)
Admission: RE | Admit: 2011-05-22 | Discharge: 2011-05-22 | Disposition: A | Discharge status: Home / Self Care | Payer: Managed Care, Other (non HMO) | Source: Ambulatory Visit | Attending: Surgery | Admitting: Surgery

## 2011-05-22 ENCOUNTER — Ambulatory Visit
Admission: RE | Admit: 2011-05-22 | Discharge: 2011-05-22 | Disposition: A | Discharge status: Home / Self Care | Payer: Managed Care, Other (non HMO) | Source: Ambulatory Visit | Attending: Surgery | Admitting: Surgery

## 2011-05-22 DIAGNOSIS — Z01810 Encounter for preprocedural cardiovascular examination: Secondary | ICD-10-CM | POA: Insufficient documentation

## 2011-05-22 DIAGNOSIS — N6320 Unspecified lump in the left breast, unspecified quadrant: Secondary | ICD-10-CM

## 2011-05-22 DIAGNOSIS — Z01812 Encounter for preprocedural laboratory examination: Secondary | ICD-10-CM | POA: Insufficient documentation

## 2011-05-22 DIAGNOSIS — N6019 Diffuse cystic mastopathy of unspecified breast: Secondary | ICD-10-CM

## 2011-05-22 DIAGNOSIS — N6039 Fibrosclerosis of unspecified breast: Secondary | ICD-10-CM | POA: Insufficient documentation

## 2011-05-22 HISTORY — PX: BREAST LUMPECTOMY W/ NEEDLE LOCALIZATION: SHX1266

## 2011-05-22 LAB — GLUCOSE, CAPILLARY
Glucose-Capillary: 149 mg/dL — ABNORMAL HIGH (ref 70–99)
Glucose-Capillary: 116 mg/dL — ABNORMAL HIGH (ref 70–99)

## 2011-05-25 NOTE — Op Note (Signed)
  NAMEFINLAY, MILLS             ACCOUNT NO.:  [id number]  MEDICAL RECORD NO.:  [id number]  LOCATION:                                 FACILITY:  PHYSICIAN:  Wilmon Arms. Corliss Skains, M.D. DATE OF BIRTH:  26-Jan-1951  DATE OF PROCEDURE:  05/22/2011 DATE OF DISCHARGE:                              OPERATIVE REPORT   PREOPERATIVE DIAGNOSIS:  Left breast mass.  POSTOPERATIVE DIAGNOSIS:  Left breast mass.  PROCEDURE:  Left needle-localized lumpectomy.  SURGEON:  Wilmon Arms. Sharmon Cheramie, MD  ANESTHESIA:  General.  INDICATIONS:  This is a 60 year old female who recently underwent a left mammogram.  She has some upper outer quadrant duct ectasia with a complex intraductal mass.  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  She presents now for excision.  DESCRIPTION OF PROCEDURE:  The patient was brought to the operating room, placed in the supine position on operating room table.  After an adequate level of general anesthesia was obtained, her left breast was prepped with ChloraPrep and draped in sterile fashion.  Time-out was taken to assure the proper patient and proper procedure.  We infiltrated the area around the wire with 0.5% Marcaine with epinephrine.  We made elliptical incision around the wire.  Dissection was carried down to the subcutaneous tissues with cautery.  We took a cylinder of tissue around the wire.  We removed the specimen and oriented with the paint kit.  A specimen mammogram showed that we had the tip of the wire as well as the clip.  This was sent for pathologic examination.  I re-explored the wound.  There seemed to be a firm area medially and we excised this as an additional medial margin.  This was also oriented with the paint kit. This was sent for pathologic examination.  The wound was inspected for hemostasis.  We then closed with a deep layer of 3-0 Vicryl.  A 4-0 Monocryl was used to  close the skin.  Steri-Strips and clean dressings were applied.  The patient was then extubated and brought to the recovery room in stable condition.  All sponge, instrument, and needle counts were correct.     Wilmon Arms. Corliss Skains, M.D.     MKT/MEDQ  D:  05/22/2011  T:  05/22/2011  Job:  [id number]  Electronically Signed by Manus Rudd M.D. on 05/25/2011 06:50:43 PM

## 2011-06-01 ENCOUNTER — Encounter (INDEPENDENT_AMBULATORY_CARE_PROVIDER_SITE_OTHER): Payer: Self-pay | Admitting: Surgery

## 2011-06-01 ENCOUNTER — Ambulatory Visit (INDEPENDENT_AMBULATORY_CARE_PROVIDER_SITE_OTHER): Payer: Managed Care, Other (non HMO) | Admitting: Surgery

## 2011-06-01 VITALS — Temp 97.8°F | Wt 169.4 lb

## 2011-06-01 DIAGNOSIS — N6320 Unspecified lump in the left breast, unspecified quadrant: Secondary | ICD-10-CM

## 2011-06-01 DIAGNOSIS — N63 Unspecified lump in unspecified breast: Secondary | ICD-10-CM

## 2011-06-01 NOTE — Progress Notes (Signed)
Follow-up after left lumpectomy on 05/22/11.  The incision is healing well.  She has been having headaches, which is common for her after surgeries due to the lumbar-peritoneal shunt.  The pathology report was benign with nodular dense fibrosis and sclerosis.  The incision is completely healed with no sign of seroma/ hematoma/ infection.  Follow-up on a PRN basis.

## 2012-01-22 ENCOUNTER — Other Ambulatory Visit: Payer: Self-pay | Admitting: Geriatric Medicine

## 2012-01-22 DIAGNOSIS — R1310 Dysphagia, unspecified: Secondary | ICD-10-CM

## 2012-01-25 ENCOUNTER — Ambulatory Visit
Admission: RE | Admit: 2012-01-25 | Discharge: 2012-01-25 | Disposition: A | Discharge status: Home / Self Care | Payer: Managed Care, Other (non HMO) | Source: Ambulatory Visit | Attending: Geriatric Medicine | Admitting: Geriatric Medicine

## 2012-01-25 DIAGNOSIS — R1310 Dysphagia, unspecified: Secondary | ICD-10-CM

## 2012-07-12 ENCOUNTER — Ambulatory Visit: Payer: Managed Care, Other (non HMO) | Attending: Geriatric Medicine | Admitting: Physical Therapy

## 2012-07-12 DIAGNOSIS — IMO0001 Reserved for inherently not codable concepts without codable children: Secondary | ICD-10-CM | POA: Insufficient documentation

## 2012-07-12 DIAGNOSIS — M6281 Muscle weakness (generalized): Secondary | ICD-10-CM | POA: Insufficient documentation

## 2012-07-12 DIAGNOSIS — R269 Unspecified abnormalities of gait and mobility: Secondary | ICD-10-CM | POA: Insufficient documentation

## 2012-07-19 ENCOUNTER — Ambulatory Visit: Payer: Managed Care, Other (non HMO) | Attending: Geriatric Medicine | Admitting: Physical Therapy

## 2012-07-19 DIAGNOSIS — R269 Unspecified abnormalities of gait and mobility: Secondary | ICD-10-CM | POA: Insufficient documentation

## 2012-07-19 DIAGNOSIS — M6281 Muscle weakness (generalized): Secondary | ICD-10-CM | POA: Insufficient documentation

## 2012-07-19 DIAGNOSIS — IMO0001 Reserved for inherently not codable concepts without codable children: Secondary | ICD-10-CM | POA: Insufficient documentation

## 2012-07-21 ENCOUNTER — Ambulatory Visit: Payer: Managed Care, Other (non HMO) | Admitting: Physical Therapy

## 2012-07-25 ENCOUNTER — Ambulatory Visit: Payer: Managed Care, Other (non HMO) | Admitting: Physical Therapy

## 2012-07-29 ENCOUNTER — Encounter: Payer: Managed Care, Other (non HMO) | Admitting: Physical Therapy

## 2012-08-01 ENCOUNTER — Ambulatory Visit: Payer: Managed Care, Other (non HMO) | Admitting: Physical Therapy

## 2012-08-02 ENCOUNTER — Encounter: Payer: Managed Care, Other (non HMO) | Admitting: Physical Therapy

## 2012-08-03 ENCOUNTER — Encounter: Payer: Managed Care, Other (non HMO) | Admitting: Physical Therapy

## 2012-08-04 ENCOUNTER — Ambulatory Visit: Payer: Managed Care, Other (non HMO) | Admitting: Physical Therapy

## 2012-08-06 ENCOUNTER — Emergency Department (HOSPITAL_COMMUNITY)
Admission: EM | Admit: 2012-08-06 | Discharge: 2012-08-06 | Disposition: A | Discharge status: Home / Self Care | Payer: Managed Care, Other (non HMO) | Attending: Emergency Medicine | Admitting: Emergency Medicine

## 2012-08-06 ENCOUNTER — Emergency Department (HOSPITAL_COMMUNITY): Payer: Managed Care, Other (non HMO)

## 2012-08-06 ENCOUNTER — Encounter (HOSPITAL_COMMUNITY): Payer: Self-pay | Admitting: Emergency Medicine

## 2012-08-06 DIAGNOSIS — S5010XA Contusion of unspecified forearm, initial encounter: Secondary | ICD-10-CM | POA: Insufficient documentation

## 2012-08-06 DIAGNOSIS — S5011XA Contusion of right forearm, initial encounter: Secondary | ICD-10-CM

## 2012-08-06 DIAGNOSIS — Y92009 Unspecified place in unspecified non-institutional (private) residence as the place of occurrence of the external cause: Secondary | ICD-10-CM | POA: Insufficient documentation

## 2012-08-06 DIAGNOSIS — W208XXA Other cause of strike by thrown, projected or falling object, initial encounter: Secondary | ICD-10-CM | POA: Insufficient documentation

## 2012-08-06 DIAGNOSIS — Z9089 Acquired absence of other organs: Secondary | ICD-10-CM | POA: Insufficient documentation

## 2012-08-06 DIAGNOSIS — E119 Type 2 diabetes mellitus without complications: Secondary | ICD-10-CM | POA: Insufficient documentation

## 2012-08-06 DIAGNOSIS — Z7982 Long term (current) use of aspirin: Secondary | ICD-10-CM | POA: Insufficient documentation

## 2012-08-06 DIAGNOSIS — Z79899 Other long term (current) drug therapy: Secondary | ICD-10-CM | POA: Insufficient documentation

## 2012-08-06 DIAGNOSIS — I10 Essential (primary) hypertension: Secondary | ICD-10-CM | POA: Insufficient documentation

## 2012-08-06 MED ORDER — HYDROCODONE-ACETAMINOPHEN 5-325 MG PO TABS: 1.0000 | ORAL_TABLET | ORAL | Status: DC | PRN

## 2012-08-06 MED ORDER — HYDROCODONE-ACETAMINOPHEN 5-325 MG PO TABS
2.0000 | ORAL_TABLET | Freq: Once | ORAL | Status: AC
  Administered 2012-08-06: 2 via ORAL
  Filled 2012-08-06: qty 2

## 2012-08-06 NOTE — ED Provider Notes (Signed)
History     CSN: [id number]  Arrival date & time 08/06/12  2058   First MD Initiated Contact with Patient 08/06/12 2147      Chief Complaint  Patient presents with  . Arm Injury   HPI  Provided by the patient. Patient is a 61 year old female with history of hypertension, hyperlipidemia and diabetes who presents with injury to right forearm. Patient was cleaning above refrigerator and a large heavy cast iron pots fell from a cupboard above. Patient reports protecting herself with her right arm that was struck against the forearm with the Pott. She has bruising and pain over the area. Pain is worse with movements of the wrist and forearm. She denies any numbness or weakness in the hand and fingers. She denies any other injury. She has not used any treatment for symptoms.    Past Medical History  Diagnosis Date  . Diabetes mellitus   . Peripheral neuropathy   . Hypertension   . Hyperlipidemia   . GERD (gastroesophageal reflux disease)   . Fatigue   . Breast pain   . Lump   . Wears dentures   . Dizziness   . Headache   . Pseudotumor cerebri     Past Surgical History  Procedure Date  . Appendectomy   . Bladder tack   . Lumbar peritoneal shunt   . Total abdominal hysterectomy w/ bilateral salpingoophorectomy   . Breast surgery     Family History  Problem Relation Age of Onset  . Heart disease Father     History  Substance Use Topics  . Smoking status: Never Smoker   . Smokeless tobacco: Not on file  . Alcohol Use: No    OB History    Grav Para Term Preterm Abortions TAB SAB Ect Mult Living                  Review of Systems  Musculoskeletal:       Swelling with pain and bruising to right forearm.  Neurological: Negative for weakness and numbness.    Allergies  Carbamazepine; Penicillins; Statins; and Tricyclic antidepressants  Home Medications   Current Outpatient Rx  Name Route Sig Dispense Refill  . AMLODIPINE BESYLATE 10 MG PO TABS Oral Take 5  mg by mouth daily.     . ASPIRIN 81 MG PO TABS Oral Take 81 mg by mouth daily.      . B COMPLEX PO TABS Oral Take 1 tablet by mouth daily.      [name] CLONIDINE HCL 0.3 MG PO TABS Oral Take 0.3 mg by mouth at bedtime.     [name] DIVALPROEX SODIUM ER 250 MG PO TB24 Oral Take 500 mg by mouth at bedtime.    [name] ESOMEPRAZOLE MAGNESIUM 40 MG PO CPDR Oral Take 40 mg by mouth as needed. Acid reflux    . ESTROPIPATE 1.5 MG PO TABS Oral Take 1.5 mg by mouth daily.      . FUROSEMIDE 20 MG PO TABS Oral Take 20 mg by mouth daily.      [name] GLIPIZIDE 10 MG PO TABS Oral Take 10 mg by mouth 2 (two) times daily before a meal.      . INSULIN GLARGINE 100 UNIT/ML [place] SOLN Subcutaneous Inject 14 Units into the skin at bedtime.      [name] METFORMIN HCL ER (MOD) 500 MG PO TB24 Oral Take 1,000 mg by mouth daily with breakfast.     . METOPROLOL SUCCINATE ER 100 MG PO TB24  Oral Take 100 mg by mouth every morning.     [name] METOPROLOL SUCCINATE ER 50 MG PO TB24 Oral Take 50 mg by mouth at bedtime.     . TELMISARTAN 80 MG PO TABS Oral Take 80 mg by mouth daily.        BP 153/81  Pulse 90  Temp 98.1 F (36.7 C) (Oral)  Resp 18  SpO2 99%  Physical Exam  Nursing note and vitals reviewed. Constitutional: She is oriented to person, place, and time. She appears well-developed and well-nourished. No distress.  HENT:  Head: Normocephalic.  Cardiovascular: Normal rate and regular rhythm.   Pulmonary/Chest: Effort normal and breath sounds normal.  Musculoskeletal:       Swelling with 3 cm hematoma over the mid dorsal right forearm over radial aspect.  Diffuse TTP over this area.  Normal distal pulses, sensation in fingers and cap refill less than 2 seconds.   Neurological: She is alert and oriented to person, place, and time.  Skin: Skin is warm and dry.  Psychiatric: She has a normal mood and affect. Her behavior is normal.    ED Course  Procedures   Dg Forearm Right  08/06/2012  *RADIOLOGY REPORT*  Clinical Data: Pain after injury.   RIGHT FOREARM - 2 VIEW  Comparison: None.  Findings: No fracture or dislocation is noted.  Joint spaces are intact.  No radiopaque foreign bodies seen.  IMPRESSION: Normal right forearm.   Original Report Authenticated By: Venita Sheffield., M.D.      1. Contusion of forearm, right   2. Traumatic hematoma of forearm       MDM  9:50 PM patient seen and evaluated. Patient with bruising and swelling to the dorsal right forearm. X-rays do not show any underlying fracture        Angus Seller, PA 08/06/12 2320

## 2012-08-06 NOTE — ED Notes (Signed)
Pt sts large cast iron hit right forearm.pt c/o pain and unable to move. swelling at site.VSS/pwd

## 2012-08-08 NOTE — ED Provider Notes (Signed)
Medical screening examination/treatment/procedure(s) were performed by non-physician practitioner and as supervising physician I was immediately available for consultation/collaboration.  Lawsyn Heiler R. Nuriyah Hanline, MD 08/08/12 0012 

## 2012-08-11 ENCOUNTER — Ambulatory Visit: Payer: Managed Care, Other (non HMO) | Admitting: Physical Therapy

## 2012-08-12 ENCOUNTER — Ambulatory Visit: Payer: Managed Care, Other (non HMO) | Admitting: Physical Therapy

## 2012-10-19 HISTORY — PX: WRIST SURGERY: SHX841

## 2012-12-28 ENCOUNTER — Other Ambulatory Visit: Payer: Self-pay | Admitting: Geriatric Medicine

## 2012-12-28 DIAGNOSIS — G459 Transient cerebral ischemic attack, unspecified: Secondary | ICD-10-CM

## 2012-12-29 ENCOUNTER — Other Ambulatory Visit: Payer: Managed Care, Other (non HMO)

## 2013-01-03 ENCOUNTER — Other Ambulatory Visit: Payer: Managed Care, Other (non HMO)

## 2013-03-24 ENCOUNTER — Other Ambulatory Visit: Payer: Self-pay | Admitting: Geriatric Medicine

## 2013-03-24 DIAGNOSIS — R1013 Epigastric pain: Secondary | ICD-10-CM

## 2013-03-27 ENCOUNTER — Ambulatory Visit
Admission: RE | Admit: 2013-03-27 | Discharge: 2013-03-27 | Disposition: A | Discharge status: Home / Self Care | Payer: Managed Care, Other (non HMO) | Source: Ambulatory Visit | Attending: Geriatric Medicine | Admitting: Geriatric Medicine

## 2013-03-27 DIAGNOSIS — R1013 Epigastric pain: Secondary | ICD-10-CM

## 2013-04-18 DIAGNOSIS — M65979 Unspecified synovitis and tenosynovitis, unspecified ankle and foot: Secondary | ICD-10-CM

## 2013-04-18 DIAGNOSIS — M659 Synovitis and tenosynovitis, unspecified: Secondary | ICD-10-CM

## 2013-04-18 HISTORY — DX: Synovitis and tenosynovitis, unspecified: M65.9

## 2013-04-18 HISTORY — DX: Unspecified synovitis and tenosynovitis, unspecified ankle and foot: M65.979

## 2013-05-11 ENCOUNTER — Encounter (HOSPITAL_BASED_OUTPATIENT_CLINIC_OR_DEPARTMENT_OTHER): Payer: Self-pay | Admitting: *Deleted

## 2013-05-11 NOTE — Pre-Procedure Instructions (Signed)
To come for BMET 

## 2013-05-11 NOTE — Pre-Procedure Instructions (Signed)
History discussed with Dr. Ivin Booty, office note reviewed by him; pt. OK to come for surgery.

## 2013-05-11 NOTE — Pre-Procedure Instructions (Signed)
Office note, EKG and labs req. from Dr. Laverle Hobby office.

## 2013-05-16 NOTE — Progress Notes (Signed)
Reviewed by Dr Sampson Goon clear for surg

## 2013-05-18 ENCOUNTER — Ambulatory Visit (HOSPITAL_BASED_OUTPATIENT_CLINIC_OR_DEPARTMENT_OTHER)
Admission: RE | Admit: 2013-05-18 | Discharge: 2013-05-18 | Disposition: A | Discharge status: Home / Self Care | Payer: Managed Care, Other (non HMO) | Source: Ambulatory Visit | Attending: Orthopedic Surgery | Admitting: Orthopedic Surgery

## 2013-05-18 ENCOUNTER — Encounter (HOSPITAL_BASED_OUTPATIENT_CLINIC_OR_DEPARTMENT_OTHER): Payer: Self-pay | Admitting: Certified Registered Nurse Anesthetist

## 2013-05-18 ENCOUNTER — Encounter (HOSPITAL_BASED_OUTPATIENT_CLINIC_OR_DEPARTMENT_OTHER)
Admission: RE | Disposition: A | Discharge status: Home / Self Care | Payer: Self-pay | Source: Ambulatory Visit | Attending: Orthopedic Surgery

## 2013-05-18 ENCOUNTER — Ambulatory Visit (HOSPITAL_BASED_OUTPATIENT_CLINIC_OR_DEPARTMENT_OTHER): Payer: Managed Care, Other (non HMO) | Admitting: Certified Registered Nurse Anesthetist

## 2013-05-18 ENCOUNTER — Other Ambulatory Visit: Payer: Self-pay | Admitting: Orthopedic Surgery

## 2013-05-18 DIAGNOSIS — M6580 Other synovitis and tenosynovitis, unspecified site: Secondary | ICD-10-CM | POA: Insufficient documentation

## 2013-05-18 DIAGNOSIS — Z794 Long term (current) use of insulin: Secondary | ICD-10-CM | POA: Insufficient documentation

## 2013-05-18 DIAGNOSIS — D649 Anemia, unspecified: Secondary | ICD-10-CM | POA: Insufficient documentation

## 2013-05-18 DIAGNOSIS — E119 Type 2 diabetes mellitus without complications: Secondary | ICD-10-CM | POA: Insufficient documentation

## 2013-05-18 DIAGNOSIS — Z7982 Long term (current) use of aspirin: Secondary | ICD-10-CM | POA: Insufficient documentation

## 2013-05-18 DIAGNOSIS — S96819A Strain of other specified muscles and tendons at ankle and foot level, unspecified foot, initial encounter: Secondary | ICD-10-CM | POA: Insufficient documentation

## 2013-05-18 DIAGNOSIS — E7800 Pure hypercholesterolemia, unspecified: Secondary | ICD-10-CM | POA: Insufficient documentation

## 2013-05-18 DIAGNOSIS — Z791 Long term (current) use of non-steroidal anti-inflammatories (NSAID): Secondary | ICD-10-CM | POA: Insufficient documentation

## 2013-05-18 DIAGNOSIS — G609 Hereditary and idiopathic neuropathy, unspecified: Secondary | ICD-10-CM | POA: Insufficient documentation

## 2013-05-18 DIAGNOSIS — G932 Benign intracranial hypertension: Secondary | ICD-10-CM | POA: Insufficient documentation

## 2013-05-18 DIAGNOSIS — I10 Essential (primary) hypertension: Secondary | ICD-10-CM | POA: Insufficient documentation

## 2013-05-18 DIAGNOSIS — M659 Synovitis and tenosynovitis, unspecified: Secondary | ICD-10-CM

## 2013-05-18 DIAGNOSIS — Z79899 Other long term (current) drug therapy: Secondary | ICD-10-CM | POA: Insufficient documentation

## 2013-05-18 DIAGNOSIS — Y929 Unspecified place or not applicable: Secondary | ICD-10-CM | POA: Insufficient documentation

## 2013-05-18 DIAGNOSIS — Z8781 Personal history of (healed) traumatic fracture: Secondary | ICD-10-CM | POA: Insufficient documentation

## 2013-05-18 DIAGNOSIS — S93499A Sprain of other ligament of unspecified ankle, initial encounter: Secondary | ICD-10-CM | POA: Insufficient documentation

## 2013-05-18 DIAGNOSIS — K219 Gastro-esophageal reflux disease without esophagitis: Secondary | ICD-10-CM | POA: Insufficient documentation

## 2013-05-18 DIAGNOSIS — X500XXA Overexertion from strenuous movement or load, initial encounter: Secondary | ICD-10-CM | POA: Insufficient documentation

## 2013-05-18 HISTORY — DX: Synovitis and tenosynovitis, unspecified: M65.9

## 2013-05-18 HISTORY — PX: ANKLE ARTHROSCOPY: SHX545

## 2013-05-18 HISTORY — DX: Pure hypercholesterolemia, unspecified: E78.00

## 2013-05-18 HISTORY — DX: Other specified postprocedural states: Z98.890

## 2013-05-18 HISTORY — DX: Anemia, unspecified: D64.9

## 2013-05-18 HISTORY — DX: Nausea with vomiting, unspecified: R11.2

## 2013-05-18 HISTORY — DX: Unspecified convulsions: R56.9

## 2013-05-18 HISTORY — DX: Adverse effect of unspecified anesthetic, initial encounter: T41.45XA

## 2013-05-18 HISTORY — DX: Other complications of anesthesia, initial encounter: T88.59XA

## 2013-05-18 SURGERY — ARTHROSCOPY, ANKLE
Anesthesia: Regional | Site: Ankle | Laterality: Left | Wound class: Clean

## 2013-05-18 MED ORDER — ONDANSETRON HCL 4 MG/2ML IJ SOLN
INTRAMUSCULAR | Status: DC | PRN
  Administered 2013-05-18: 4 mg via INTRAVENOUS

## 2013-05-18 MED ORDER — ROPIVACAINE HCL 5 MG/ML IJ SOLN
INTRAMUSCULAR | Status: DC | PRN
  Administered 2013-05-18: 20 mL

## 2013-05-18 MED ORDER — LIDOCAINE-EPINEPHRINE (PF) 1.5 %-1:200000 IJ SOLN
INTRAMUSCULAR | Status: DC | PRN
  Administered 2013-05-18: 20 mL

## 2013-05-18 MED ORDER — BACITRACIN ZINC 500 UNIT/GM EX OINT
TOPICAL_OINTMENT | CUTANEOUS | Status: DC | PRN
  Administered 2013-05-18: 1 via TOPICAL

## 2013-05-18 MED ORDER — HYDROMORPHONE HCL PF 1 MG/ML IJ SOLN: 0.2500 mg | INTRAMUSCULAR | Status: DC | PRN

## 2013-05-18 MED ORDER — SODIUM CHLORIDE 0.9 % IR SOLN
Status: DC | PRN
  Administered 2013-05-18: 1

## 2013-05-18 MED ORDER — LIDOCAINE HCL 1 % IJ SOLN
INTRAMUSCULAR | Status: DC | PRN
  Administered 2013-05-18: 2 mL via INTRADERMAL

## 2013-05-18 MED ORDER — OXYCODONE HCL 5 MG PO TABS: 5.0000 mg | ORAL_TABLET | Freq: Once | ORAL | Status: DC | PRN

## 2013-05-18 MED ORDER — LIDOCAINE HCL (CARDIAC) 20 MG/ML IV SOLN
INTRAVENOUS | Status: DC | PRN
  Administered 2013-05-18: 30 mg via INTRAVENOUS

## 2013-05-18 MED ORDER — CHLORHEXIDINE GLUCONATE 4 % EX LIQD: 60.0000 mL | Freq: Once | CUTANEOUS | Status: DC

## 2013-05-18 MED ORDER — FENTANYL CITRATE 0.05 MG/ML IJ SOLN
50.0000 ug | INTRAMUSCULAR | Status: DC | PRN
  Administered 2013-05-18: 50 ug via INTRAVENOUS

## 2013-05-18 MED ORDER — MIDAZOLAM HCL 2 MG/ML PO SYRP: 12.0000 mg | ORAL_SOLUTION | Freq: Once | ORAL | Status: DC | PRN

## 2013-05-18 MED ORDER — OXYCODONE HCL 5 MG/5ML PO SOLN: 5.0000 mg | Freq: Once | ORAL | Status: DC | PRN

## 2013-05-18 MED ORDER — METOCLOPRAMIDE HCL 5 MG/ML IJ SOLN: 10.0000 mg | Freq: Once | INTRAMUSCULAR | Status: DC | PRN

## 2013-05-18 MED ORDER — OXYCODONE HCL 5 MG PO TABS: 5.0000 mg | ORAL_TABLET | ORAL | Status: DC | PRN

## 2013-05-18 MED ORDER — VANCOMYCIN HCL IN DEXTROSE 1-5 GM/200ML-% IV SOLN
1000.0000 mg | INTRAVENOUS | Status: AC
  Administered 2013-05-18: 1000 mg via INTRAVENOUS

## 2013-05-18 MED ORDER — MIDAZOLAM HCL 2 MG/2ML IJ SOLN
1.0000 mg | INTRAMUSCULAR | Status: DC | PRN
  Administered 2013-05-18: 2 mg via INTRAVENOUS

## 2013-05-18 MED ORDER — MIDAZOLAM HCL 5 MG/5ML IJ SOLN
INTRAMUSCULAR | Status: DC | PRN
  Administered 2013-05-18: 0.5 mg via INTRAVENOUS

## 2013-05-18 MED ORDER — LACTATED RINGERS IV SOLN
INTRAVENOUS | Status: DC
  Administered 2013-05-18 (×2): via INTRAVENOUS

## 2013-05-18 MED ORDER — SODIUM CHLORIDE 0.9 % IV SOLN: INTRAVENOUS | Status: DC

## 2013-05-18 MED ORDER — PROPOFOL 10 MG/ML IV BOLUS
INTRAVENOUS | Status: DC | PRN
  Administered 2013-05-18: 150 mg via INTRAVENOUS

## 2013-05-18 MED ORDER — DEXAMETHASONE SODIUM PHOSPHATE 10 MG/ML IJ SOLN
INTRAMUSCULAR | Status: DC | PRN
  Administered 2013-05-18: 4 mg via INTRAVENOUS

## 2013-05-18 SURGICAL SUPPLY — 75 items
BANDAGE ESMARK 6X9 LF (GAUZE/BANDAGES/DRESSINGS) ×1 IMPLANT
BLADE CUDA 2.0 (BLADE) IMPLANT
BLADE CUDA GRT WHITE 3.5 (BLADE) IMPLANT
BLADE CUDA SHAVER 3.5 (BLADE) IMPLANT
BLADE CUTTER GATOR 3.5 (BLADE) IMPLANT
BLADE SURG 15 STRL LF DISP TIS (BLADE) ×1 IMPLANT
BLADE SURG 15 STRL SS (BLADE) ×2
BNDG CMPR 9X6 STRL LF SNTH (GAUZE/BANDAGES/DRESSINGS) ×1
BNDG COHESIVE 4X5 TAN STRL (GAUZE/BANDAGES/DRESSINGS) ×2 IMPLANT
BNDG COHESIVE 6X5 TAN STRL LF (GAUZE/BANDAGES/DRESSINGS) ×1 IMPLANT
BNDG ESMARK 6X9 LF (GAUZE/BANDAGES/DRESSINGS) ×2
BUR 3.5 LG SPHERICAL (BURR) IMPLANT
BUR CUDA 2.9 (BURR) ×2 IMPLANT
BUR FULL RADIUS 2.9 (BURR) IMPLANT
BUR GATOR 2.9 (BURR) IMPLANT
BUR OVAL 4.0 (BURR) IMPLANT
BUR SPHERICAL 2.9 (BURR) IMPLANT
BUR VERTEX HOODED 4.5 (BURR) IMPLANT
BURR 3.5 LG SPHERICAL (BURR)
CANISTER OMNI JUG 16 LITER (MISCELLANEOUS) ×2 IMPLANT
CANISTER SUCTION 2500CC (MISCELLANEOUS) IMPLANT
CHLORAPREP W/TINT 26ML (MISCELLANEOUS) ×2 IMPLANT
CLOTH BEACON ORANGE TIMEOUT ST (SAFETY) ×2 IMPLANT
CUFF TOURNIQUET SINGLE 34IN LL (TOURNIQUET CUFF) ×1 IMPLANT
DRAPE EXTREMITY T 121X128X90 (DRAPE) ×2 IMPLANT
DRAPE OEC MINIVIEW 54X84 (DRAPES) IMPLANT
DRAPE U-SHAPE 47X51 STRL (DRAPES) ×2 IMPLANT
DRSG EMULSION OIL 3X3 NADH (GAUZE/BANDAGES/DRESSINGS) ×2 IMPLANT
DURA STEPPER LG (CAST SUPPLIES) IMPLANT
DURA STEPPER MED (CAST SUPPLIES) IMPLANT
DURA STEPPER SML (CAST SUPPLIES) ×1 IMPLANT
ELECT REM PT RETURN 9FT ADLT (ELECTROSURGICAL) ×2
ELECTRODE REM PT RTRN 9FT ADLT (ELECTROSURGICAL) ×1 IMPLANT
GLOVE BIO SURGEON STRL SZ7 (GLOVE) ×1 IMPLANT
GLOVE BIO SURGEON STRL SZ8 (GLOVE) ×2 IMPLANT
GLOVE BIOGEL PI IND STRL 7.0 (GLOVE) IMPLANT
GLOVE BIOGEL PI IND STRL 8 (GLOVE) ×1 IMPLANT
GLOVE BIOGEL PI INDICATOR 7.0 (GLOVE) ×1
GLOVE BIOGEL PI INDICATOR 8 (GLOVE) ×1
GLOVE EXAM NITRILE MD LF STRL (GLOVE) ×1 IMPLANT
GOWN PREVENTION PLUS XLARGE (GOWN DISPOSABLE) ×2 IMPLANT
GOWN PREVENTION PLUS XXLARGE (GOWN DISPOSABLE) ×2 IMPLANT
NS IRRIG 1000ML POUR BTL (IV SOLUTION) IMPLANT
PACK ARTHROSCOPY DSU (CUSTOM PROCEDURE TRAY) ×2 IMPLANT
PACK BASIN DAY SURGERY FS (CUSTOM PROCEDURE TRAY) ×2 IMPLANT
PAD CAST 4YDX4 CTTN HI CHSV (CAST SUPPLIES) ×1 IMPLANT
PADDING CAST ABS 4INX4YD NS (CAST SUPPLIES)
PADDING CAST ABS COTTON 4X4 ST (CAST SUPPLIES) IMPLANT
PADDING CAST COTTON 4X4 STRL (CAST SUPPLIES) ×2
PADDING CAST COTTON 6X4 STRL (CAST SUPPLIES) ×2 IMPLANT
PENCIL BUTTON HOLSTER BLD 10FT (ELECTRODE) ×1 IMPLANT
SANITIZER HAND PURELL 535ML FO (MISCELLANEOUS) ×2 IMPLANT
SET IRRIG Y TYPE TUR BLADDER L (SET/KITS/TRAYS/PACK) ×2 IMPLANT
SLEEVE SCD COMPRESS KNEE MED (MISCELLANEOUS) ×2 IMPLANT
SPLINT FAST PLASTER 5X30 (CAST SUPPLIES)
SPLINT PLASTER CAST FAST 5X30 (CAST SUPPLIES) IMPLANT
SPONGE GAUZE 4X4 12PLY (GAUZE/BANDAGES/DRESSINGS) ×2 IMPLANT
SPONGE LAP 18X18 X RAY DECT (DISPOSABLE) ×1 IMPLANT
STOCKINETTE 6  STRL (DRAPES) ×1
STOCKINETTE 6 STRL (DRAPES) ×1 IMPLANT
STRAP ANKLE FOOT DISTRACTOR (ORTHOPEDIC SUPPLIES) ×1 IMPLANT
SUCTION FRAZIER TIP 10 FR DISP (SUCTIONS) ×1 IMPLANT
SUT ETHILON 3 0 PS 1 (SUTURE) ×2 IMPLANT
SUT MNCRL AB 3-0 PS2 18 (SUTURE) ×1 IMPLANT
SUT VIC AB 0 CT1 27 (SUTURE)
SUT VIC AB 0 CT1 27XBRD ANBCTR (SUTURE) IMPLANT
SUT VIC AB 2-0 SH 18 (SUTURE) IMPLANT
SUT VIC AB 3-0 PS1 18 (SUTURE)
SUT VIC AB 3-0 PS1 18XBRD (SUTURE) IMPLANT
SYR BULB 3OZ (MISCELLANEOUS) ×1 IMPLANT
TOWEL OR 17X24 6PK STRL BLUE (TOWEL DISPOSABLE) ×2 IMPLANT
TOWEL OR NON WOVEN STRL DISP B (DISPOSABLE) ×2 IMPLANT
TUBE CONNECTING 20X1/4 (TUBING) IMPLANT
WAND STAR VAC 90 (SURGICAL WAND) IMPLANT
WATER STERILE IRR 1000ML POUR (IV SOLUTION) ×2 IMPLANT

## 2013-05-18 NOTE — H&P (Signed)
[name] is an 62 y.o. female.   Chief Complaint: left ankle and hindfoot pain HPI: 62 y/o female with left ankle pain after a sprain last year.  She has failed non op treatment and presents now for left ankle arthroscopy for debridement of synovitis and subtalar synovectomy.    Past Medical History  Diagnosis Date  . GERD (gastroesophageal reflux disease)   . Wears dentures     full  . Pseudotumor cerebri     has lumbar peritoneal shunt  . PONV (postoperative nausea and vomiting)   . 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 Headache(784.0)     due to pseudotumor; daily headache  . Anemia     takes iron supplement  . Hypertension     under control with meds., has been on med. x 5 yr.  . Diabetes mellitus     IDDM  . Anesthesia complication     disorientation due to pseudotumor  . 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 Peripheral neuropathy   . Hypercholesteremia     unable to tolerate statins    Past Surgical History  Procedure Laterality Date  . Appendectomy      as a child  . Bladder suspension    . Lumbar peritoneal shunt      x 2  . Total abdominal hysterectomy w/ bilateral salpingoophorectomy      age 22s  . Ovarian cyst removal      age 35  . Shunt revision  2007  . Breast lumpectomy w/ needle localization Left 05/22/2011  . Esophageal dilation  06/23/2006; 08/05/2004    Family History  Problem Relation Age of Onset  . Heart disease Father    Social History:  reports that she has never smoked. She has never used smokeless tobacco. She reports that she does not drink alcohol or use illicit drugs.  Allergies:  Allergies  Allergen Reactions  . Penicillins Anaphylaxis  . Carbamazepine Hives  . Statins Other (See Comments)    CHEST PAIN AND LEG NUMBNESS  . Tricyclic Antidepressants Other (See Comments)    IMPAIRED MEMORY    Medications Prior to Admission  Medication Sig Dispense Refill  . aspirin 81 MG tablet Take 81 mg by mouth  daily.        Marland Kitchen b complex vitamins tablet Take 1 tablet by mouth daily.        . cloNIDine (CATAPRES) 0.3 MG tablet Take 0.3 mg by mouth 2 (two) times daily.       . diclofenac sodium (VOLTAREN) 1 % GEL Apply topically.      . divalproex (DEPAKOTE ER) 250 MG 24 hr tablet Take 250 mg by mouth 3 (three) times daily.       Marland Kitchen esomeprazole (NEXIUM) 40 MG capsule Take 40 mg by mouth as needed. Acid reflux      . estropipate (OGEN) 1.5 MG tablet Take 1.5 mg by mouth daily.       . ferrous sulfate dried (SLOW FE) 160 (50 FE) MG TBCR Take 160 mg by mouth 2 (two) times daily with a meal.      . glipiZIDE (GLUCOTROL) 10 MG tablet Take 10 mg by mouth 2 (two) times daily before a meal. 2 TABS. AM AND 1 AT NIGHT      . insulin glargine (LANTUS) 100 UNIT/ML injection Inject 18 Units into the skin at bedtime.       . metFORMIN (  GLUMETZA) 500 MG (MOD) 24 hr tablet Take 1,000 mg by mouth 2 (two) times daily with a meal.       . metoprolol (TOPROL-XL) 100 MG 24 hr tablet Take 100 mg by mouth every morning.       . metoprolol (TOPROL-XL) 50 MG 24 hr tablet Take 50 mg by mouth at bedtime.       Marland Kitchen omeprazole (PRILOSEC) 20 MG capsule Take 20 mg by mouth daily.      Marland Kitchen telmisartan (MICARDIS) 80 MG tablet Take 80 mg by mouth daily.         Results for orders placed during the hospital encounter of May 31, 2013 (from the past 48 hour(s))  GLUCOSE, CAPILLARY     Status: Abnormal   Collection Time    2013-05-31  6:53 AM      Result Value Range   Glucose-Capillary 195 (*) 70 - 99 mg/dL  POCT HEMOGLOBIN-HEMACUE     Status: Abnormal   Collection Time    2013/05/31  6:54 AM      Result Value Range   Hemoglobin 10.6 (*) 12.0 - 15.0 g/dL   No results found.  ROS  No recent f/c/n/v/wt loss  Blood pressure 135/63, pulse 78, temperature 98 F (36.7 C), temperature source Oral, resp. rate 14, height 5\' 1"  (1.549 m), weight 78.744 kg (173 lb 9.6 oz), SpO2 100.00%. Physical Exam  wn wd woman in nad.  A and O x 4.  Mood and  affect normal.  EOMI.  Resp unlabored.  L ankle with healthy skin and small effusion.  No lymphadenopthay.  Sens to LT inatct.  5/5 strength in PF, DF, inversion and eversion.  Assessment/Plan L ankle and subtalar joint synovitis - to OR for left ankle arthroscopic debridement and subtalar arthrotomy for debridement.  The risks and benefits of the alternative treatment options have been discussed in detail.  The patient wishes to proceed with surgery and specifically understands risks of bleeding, infection, nerve damage, blood clots, need for additional surgery, amputation and death.   Toni Arthurs 05/31/2013, 7:21 AM

## 2013-05-18 NOTE — Progress Notes (Signed)
Assisted Dr. Frederick with left, ultrasound guided, popliteal/saphenous block. Side rails up, monitors on throughout procedure. See vital signs in flow sheet. Tolerated Procedure well. 

## 2013-05-18 NOTE — Transfer of Care (Signed)
Immediate Anesthesia Transfer of Care Note  Patient: [name]  Procedure(s) Performed: Procedure(s): LEFT ANKLE ARTHROSCOPY WITH DEBRIDEMENT,  SUBTALAR OPEN DEBRIDEMENT  (Left)  Patient Location: PACU  Anesthesia Type:GA combined with regional for post-op pain  Level of Consciousness: awake and patient cooperative  Airway & Oxygen Therapy: Patient Spontanous Breathing and Patient connected to face mask oxygen  Post-op Assessment: Report given to PACU RN and Post -op Vital signs reviewed and stable  Post vital signs: Reviewed and stable  Complications: No apparent anesthesia complications

## 2013-05-18 NOTE — Anesthesia Procedure Notes (Addendum)
Anesthesia Regional Block:  Popliteal block  Pre-Anesthetic Checklist: ,, timeout performed, Correct Patient, Correct Site, Correct Laterality, Correct Procedure, Correct Position, site marked, Risks and benefits discussed,  Surgical consent,  Pre-op evaluation,  At surgeon's request and post-op pain management  Laterality: Left  Prep: chloraprep       Needles:   Needle Type: Other     Needle Length: 9cm  Needle Gauge: 21    Additional Needles:  Procedures: ultrasound guided (picture in chart) Popliteal block Narrative:  Start time: 05/18/2013 6:55 AM End time: 05/18/2013 7:05 AM Injection made incrementally with aspirations every 5 mL.  Performed by: Personally  Anesthesiologist: Aldona Lento, MD  Additional Notes: Ultrasound guidance used to: id relevant anatomy, confirm needle position, local anesthetic spread, avoidance of vascular puncture. Picture saved. No complications. Block performed personally by Janetta Hora. Gelene Mink, MD  .    Popliteal block Procedure Name: LMA Insertion Date/Time: 05/18/2013 7:32 AM Performed by: Chalmer Zheng D Pre-anesthesia Checklist: Patient identified, Emergency Drugs available, Suction available and Patient being monitored Patient Re-evaluated:Patient Re-evaluated prior to inductionOxygen Delivery Method: Circle System Utilized Preoxygenation: Pre-oxygenation with 100% oxygen Intubation Type: IV induction Ventilation: Mask ventilation without difficulty LMA: LMA inserted LMA Size: 4.0 Number of attempts: 1 Airway Equipment and Method: bite block Placement Confirmation: positive ETCO2 Tube secured with: Tape Dental Injury: Teeth and Oropharynx as per pre-operative assessment

## 2013-05-18 NOTE — Anesthesia Postprocedure Evaluation (Signed)
Anesthesia Post Note  Patient: [name]  Procedure(s) Performed: Procedure(s) (LRB): LEFT ANKLE ARTHROSCOPY WITH DEBRIDEMENT,  SUBTALAR OPEN DEBRIDEMENT  (Left)  Anesthesia type: General  Patient location: PACU  Post pain: Pain level controlled  Post assessment: Patient's Cardiovascular Status Stable  Last Vitals:  Filed Vitals:   05/18/13 0945  BP: 112/64  Pulse: 71  Temp:   Resp: 16    Post vital signs: Reviewed and stable  Level of consciousness: alert  Complications: No apparent anesthesia complications

## 2013-05-18 NOTE — Op Note (Signed)
NAMEBARBARITA, [name]             ACCOUNT NO.:  [id number]  MEDICAL RECORD NO.:  [id number]  LOCATION:                                 FACILITY:  PHYSICIAN:  Toni Arthurs, MD             DATE OF BIRTH:  DATE OF PROCEDURE:  05/18/2013 DATE OF DISCHARGE:                              OPERATIVE REPORT   PREOPERATIVE DIAGNOSES: 1. Left ankle synovitis. 2. Left subtalar joint synovitis.  POSTOPERATIVE DIAGNOSES: 1. Left ankle synovitis. 2. Left subtalar joint synovitis. 3. Healed impaction fracture of the medial tibial plafond.  PROCEDURES: 1. Left ankle arthroscopy with extensive debridement of synovitis of     the anterior, medial, and lateral gutters. 2. Left subtalar joint arthrotomy and synovectomy.  SURGEON:  Toni Arthurs, MD  ANESTHESIA:  General, regional.  ESTIMATED BLOOD LOSS:  Minimal.  TOURNIQUET TIME:  30 minutes at 250 mmHg.  COMPLICATIONS:  None apparent.  DISPOSITION:  Extubated, awake, and stable to recovery.  INDICATION FOR PROCEDURE:  The patient is a 62 year old female who had a left ankle inversion injury over a year ago.  She continued to have significant limiting pain despite nonoperative treatment including oral anti-inflammatories, activity modification, physical therapy, bracing, and intra-articular steroid injections.  She presents now for left ankle arthroscopy and subtalar joint arthrotomy.  She understands the risks and benefits, the alternative treatment options, and elected surgical treatment.  She specifically understands the risks of bleeding, infection, nerve damage, blood clots, need for additional surgery, amputation, and death.  PROCEDURE IN DETAIL:  After preoperative consent was obtained, the correct operative site was identified.  The patient was brought to the operating room and placed supine on the operating table.  General anesthesia was induced.  Preoperative antibiotics were administered. Surgical time-out was taken.  The  left lower extremity was prepped and draped in standard sterile fashion, tourniquet around the thigh.  The extremity was placed in a thigh holder and a traction device at the level of the foot.  Gentle traction was applied.  The foot was exsanguinated, and the tourniquet was inflated to 250 mmHg.  An anteromedial arthroscopy portal was established, and the arthroscope was inserted into the ankle joint.  Immediately evident was significant anterior gutter and medial gutter synovitis.  An anterolateral arthroscopy portal was established under direct vision.  The medial gutter was examined.  There was no loose body, but there was the aforementioned synovitis.  At the corner of the medial tibial plafond was a small impaction fracture that had healed with some fibrous tissue present in the small fracture gap.  This did not appear to be a weightbearing portion of the joint.  The articular cartilage was intact and healthy appearing across the tibial plafond.  The talar dome also appeared to have healthy and intact cartilage.  Posterior joint line had no loose bodies or signs of significant synovitis.  Lateral gutter had some synovitis anteriorly.  No loose bodies were noted.  The articular cartilage and lateral gutter appeared healthy.  The patient did have a hypertrophic Bassett's ligament at the anterolateral joint line.  The arthroscopic shaver was then used to resect the synovitis at the  healed fracture site as well as at the anteromedial joint line, anterior joint line, and lateral joint line.  The joint was irrigated thoroughly.  No loose bodies were noted.  All arthroscopic instruments were then removed.  Attention was then turned to the area of the sinus tarsi, where an incision was made from the tip of the fibula to the level of the sinus tarsi.  Sharp dissection was carried down through the skin and subcutaneous tissue.  The subtalar joint was opened.  A small lamina spreader was  inserted between the talar neck and the anterior process of the calcaneus.  This was opened allowing visualization of the posterior facet.  The articular cartilage appeared healthy, there was no loose body.  A Freer elevator was inserted and probed the joint thoroughly. Again, there was no loose body or cartilage abnormalities noted.  There was significant synovitis at the anterior portion of the joint adjacent to the sinus tarsi.  This was all sharply excised.  The middle facet appeared normal by palpation with the elevator.  The wound was again irrigated copiously.  Inverted simple sutures of 3-0 Monocryl were used to close subcutaneous tissue and running 3-0 nylon was used to close the skin incision.  The 2 arthroscopy portals were closed with horizontal mattress sutures of 3-0 nylon.  Sterile dressings were applied followed by a compression wrap.  The tourniquet was released at 30 minutes.  The patient was then awakened by anesthesia and transported to the recovery room in stable condition.  FOLLOWUP PLAN:  The patient will be weightbearing as tolerated on her left foot and a CAM boot.  She will follow up with me in 2 weeks to have her stitches removed and go into regular shoes and to likely initiate physical therapy at that time.     Toni Arthurs, MD     JH/MEDQ  D:  05/18/2013  T:  05/18/2013  Job:  [id number]

## 2013-05-18 NOTE — Anesthesia Preprocedure Evaluation (Addendum)
Anesthesia Evaluation  Patient identified by MRN, date of birth, ID band Patient awake    Reviewed: Allergy & Precautions, H&P , NPO status , Patient's Chart, lab work & pertinent test results, reviewed documented beta blocker date and time   History of Anesthesia Complications (+) PONV  Airway Mallampati: II TM Distance: >3 FB Neck ROM: full    Dental   Pulmonary neg pulmonary ROS,  breath sounds clear to auscultation        Cardiovascular hypertension, Pt. on home beta blockers and On Medications Rhythm:regular     Neuro/Psych  Headaches, Seizures -,   Neuromuscular disease negative psych ROS   GI/Hepatic Neg liver ROS, GERD-  Medicated,  Endo/Other  diabetes, Type 2, Insulin Dependent and Oral Hypoglycemic Agents  Renal/GU negative Renal ROS  negative genitourinary   Musculoskeletal   Abdominal   Peds  Hematology  (+) anemia ,   Anesthesia Other Findings See surgeon's H&P   Reproductive/Obstetrics negative OB ROS                           Anesthesia Physical Anesthesia Plan  ASA: II  Anesthesia Plan: General   Post-op Pain Management:    Induction: Intravenous  Airway Management Planned: LMA  Additional Equipment:   Intra-op Plan:   Post-operative Plan: Extubation in OR  Informed Consent: I have reviewed the patients History and Physical, chart, labs and discussed the procedure including the risks, benefits and alternatives for the proposed anesthesia with the patient or authorized representative who has indicated his/her understanding and acceptance.   Dental Advisory Given  Plan Discussed with: CRNA and Surgeon  Anesthesia Plan Comments:         Anesthesia Quick Evaluation

## 2013-05-18 NOTE — Brief Op Note (Signed)
05/18/2013  8:28 AM  PATIENT:  [name]  62 y.o. female  PRE-OPERATIVE DIAGNOSIS:  Left ankle and subtalar joint synovitis  POST-OPERATIVE DIAGNOSIS:  Left ankle and subtalar joint synovitis, healed impaction fracture of the medial tibial plafond  Procedure(s): 1.  Left ankle arthroscopy with extensive debridement of synovitis of the anterior, medial and lateral gutters 2.  Left subtalar joint arthrotomy and synovectomy  SURGEON:  Toni Arthurs, MD  ASSISTANT: n/a  ANESTHESIA:   General, regional  EBL:  minimal   TOURNIQUET:   Total Tourniquet Time Documented: Thigh (Left) - 30 minutes Total: Thigh (Left) - 30 minutes   COMPLICATIONS:  None apparent  DISPOSITION:  Extubated, awake and stable to recovery.  DICTATION ID:  [id number]

## 2013-06-06 ENCOUNTER — Other Ambulatory Visit: Payer: Self-pay | Admitting: Gastroenterology

## 2013-07-04 ENCOUNTER — Encounter (HOSPITAL_COMMUNITY)
Admission: RE | Disposition: A | Discharge status: Home / Self Care | Payer: Self-pay | Source: Ambulatory Visit | Attending: Gastroenterology

## 2013-07-04 ENCOUNTER — Other Ambulatory Visit (HOSPITAL_COMMUNITY): Payer: Self-pay | Admitting: Geriatric Medicine

## 2013-07-04 ENCOUNTER — Encounter (HOSPITAL_COMMUNITY): Payer: Self-pay | Admitting: *Deleted

## 2013-07-04 ENCOUNTER — Ambulatory Visit (HOSPITAL_COMMUNITY)
Admission: RE | Admit: 2013-07-04 | Discharge: 2013-07-04 | Disposition: A | Discharge status: Home / Self Care | Payer: Managed Care, Other (non HMO) | Source: Ambulatory Visit | Attending: Gastroenterology | Admitting: Gastroenterology

## 2013-07-04 ENCOUNTER — Ambulatory Visit (HOSPITAL_COMMUNITY)
Admission: RE | Admit: 2013-07-04 | Discharge: 2013-07-04 | Disposition: A | Discharge status: Home / Self Care | Payer: Managed Care, Other (non HMO) | Source: Ambulatory Visit | Attending: Geriatric Medicine | Admitting: Geriatric Medicine

## 2013-07-04 DIAGNOSIS — K222 Esophageal obstruction: Secondary | ICD-10-CM | POA: Insufficient documentation

## 2013-07-04 DIAGNOSIS — M25562 Pain in left knee: Secondary | ICD-10-CM

## 2013-07-04 DIAGNOSIS — K219 Gastro-esophageal reflux disease without esophagitis: Secondary | ICD-10-CM | POA: Insufficient documentation

## 2013-07-04 DIAGNOSIS — E7800 Pure hypercholesterolemia, unspecified: Secondary | ICD-10-CM | POA: Insufficient documentation

## 2013-07-04 DIAGNOSIS — M7989 Other specified soft tissue disorders: Secondary | ICD-10-CM

## 2013-07-04 DIAGNOSIS — K3184 Gastroparesis: Secondary | ICD-10-CM | POA: Insufficient documentation

## 2013-07-04 DIAGNOSIS — K449 Diaphragmatic hernia without obstruction or gangrene: Secondary | ICD-10-CM | POA: Insufficient documentation

## 2013-07-04 DIAGNOSIS — Z90710 Acquired absence of both cervix and uterus: Secondary | ICD-10-CM | POA: Insufficient documentation

## 2013-07-04 DIAGNOSIS — E1149 Type 2 diabetes mellitus with other diabetic neurological complication: Secondary | ICD-10-CM | POA: Insufficient documentation

## 2013-07-04 DIAGNOSIS — I10 Essential (primary) hypertension: Secondary | ICD-10-CM | POA: Insufficient documentation

## 2013-07-04 HISTORY — PX: BALLOON DILATION: SHX5330

## 2013-07-04 HISTORY — PX: ESOPHAGOGASTRODUODENOSCOPY: SHX5428

## 2013-07-04 SURGERY — EGD (ESOPHAGOGASTRODUODENOSCOPY)
Anesthesia: Moderate Sedation

## 2013-07-04 MED ORDER — MIDAZOLAM HCL 10 MG/2ML IJ SOLN
INTRAMUSCULAR | Status: DC | PRN
  Administered 2013-07-04: 3 mg via INTRAVENOUS
  Administered 2013-07-04: 2.5 mg via INTRAVENOUS
  Administered 2013-07-04: 2 mg via INTRAVENOUS

## 2013-07-04 MED ORDER — SODIUM CHLORIDE 0.9 % IV SOLN
INTRAVENOUS | Status: DC
  Administered 2013-07-04: 500 mL via INTRAVENOUS

## 2013-07-04 MED ORDER — MIDAZOLAM HCL 10 MG/2ML IJ SOLN
INTRAMUSCULAR | Status: AC
  Filled 2013-07-04: qty 2

## 2013-07-04 MED ORDER — FENTANYL CITRATE 0.05 MG/ML IJ SOLN
INTRAMUSCULAR | Status: DC | PRN
  Administered 2013-07-04: 50 ug via INTRAVENOUS

## 2013-07-04 MED ORDER — FENTANYL CITRATE 0.05 MG/ML IJ SOLN
INTRAMUSCULAR | Status: AC
  Filled 2013-07-04: qty 2

## 2013-07-04 MED ORDER — BUTAMBEN-TETRACAINE-BENZOCAINE 2-2-14 % EX AERO
INHALATION_SPRAY | CUTANEOUS | Status: DC | PRN
  Administered 2013-07-04: 2 via TOPICAL

## 2013-07-04 NOTE — Op Note (Signed)
Problem: Esophageal dysphagia. Chronic gastroesophageal reflux. Diabetic gastroparesis.  Endoscopist: Danise Edge  Premedication: Versed 7.5 mg intravenously. Fentanyl 50 mcg intravenously.  Procedure: Diagnostic esophagogastroduodenoscopy with balloon dilation of a benign stricture at the esophagogastric junction The patient was placed in the left lateral decubitus position. The Pentax gastroscope was passed through the posterior hypopharynx into the proximal esophagus without difficulty. The hypopharynx, larynx, and vocal cords appeared normal.  Esophagoscopy: The proximal and mid segments of the esophageal mucosa appeared normal. The squamocolumnar junction is regular and noted at 30 cm from the incisor teeth. There was a benign stricture at the esophagogastric junction less than 1 cm in length. Using the esophageal balloon dilator, the esophageal stricture was dilated to 18 mm without apparent complications. There was no endoscopic evidence for the presence of erosive esophagitis or Barrett's esophagus.  Gastroscopy: There is a moderate sized hiatal hernia. Retroflexed view of the gastric cardia and fundus was normal. The diaphragmatic hiatus was patulous. There was a small amount of residual solid food in the gastric body. The gastric body, antrum, and pylorus appeared normal.  Duodenoscopy: The duodenal bulb and descending duodenum appeared normal.  Assessment:  #1. Chronic gastroesophageal reflux associated with a hiatal hernia and complicated by a benign stricture at the esophagogastric junction dilated to 18 mm using the esophageal balloon dilator  #2. Diabetic gastroparesis  #3. Otherwise normal esophagogastroduodenoscopy.

## 2013-07-04 NOTE — H&P (Signed)
  Problem: Esophageal dysphagia  History: The patient is a 62 year old female born Aug 16, 1951. The patient has diabetic gastroparesis and takes domperidone. She also has chronic gastroesophageal reflux and takes omeprazole.   Patient describes intermittent but progressively worsening esophageal dysphagia without odynophagia. Last year, her barium esophagram showed were primary esophageal peristalsis; the barium tablet swallow was normal.  The patient is scheduled to undergo a diagnostic esophagogastroduodenoscopy with possible esophageal dilation.  Medication allergies: Penicillin. Accupril. Lipitor. Dyspepsia. Zocor. Pravachol. Crestor. Tegretol. Reglan. Rifampin. WelChol.  Past medical history: Fibrocystic breast disease. Colonic diverticulosis. Migraine syndrome. Hypercholesterolemia. Gastroesophageal reflux. Diabetic gastroparesis. Type 2 diabetes mellitus. Hypertension. Fatty liver. Bilateral kidney stones. Appendectomy. Ovarian cyst surgery. Laparoscopic lysis of abdominal adhesions. Total abdominal hysterectomy. Bilateral salpingo-oophorectomy. Bladder suspension surgery. Ventriculoperitoneal shunt placement. Left ankle arthroscopy.  Exam: The patient is alert and lying comfortably on the endoscopy stretcher. Lungs are auscultation. Cardiac exam reveals a regular rhythm. Abdomen is soft and nontender to palpation.  Plan: Proceed with diagnostic esophagogastroduodenoscopy and possible esophageal dilation to evaluate and treat esophageal dysphagia.

## 2013-07-05 ENCOUNTER — Encounter (HOSPITAL_COMMUNITY): Payer: Self-pay | Admitting: Gastroenterology

## 2014-01-01 ENCOUNTER — Other Ambulatory Visit: Payer: Self-pay | Admitting: Geriatric Medicine

## 2014-01-01 DIAGNOSIS — L723 Sebaceous cyst: Secondary | ICD-10-CM

## 2014-01-01 DIAGNOSIS — M25511 Pain in right shoulder: Secondary | ICD-10-CM

## 2014-01-02 ENCOUNTER — Other Ambulatory Visit: Payer: Self-pay

## 2014-01-02 ENCOUNTER — Other Ambulatory Visit: Payer: Self-pay | Admitting: Geriatric Medicine

## 2014-01-02 DIAGNOSIS — Z1231 Encounter for screening mammogram for malignant neoplasm of breast: Secondary | ICD-10-CM

## 2014-01-02 DIAGNOSIS — R229 Localized swelling, mass and lump, unspecified: Secondary | ICD-10-CM

## 2014-01-03 ENCOUNTER — Ambulatory Visit
Admission: RE | Admit: 2014-01-03 | Discharge: 2014-01-03 | Disposition: A | Discharge status: Home / Self Care | Payer: BC Managed Care – PPO | Source: Ambulatory Visit | Attending: Geriatric Medicine | Admitting: Geriatric Medicine

## 2014-01-03 DIAGNOSIS — R229 Localized swelling, mass and lump, unspecified: Secondary | ICD-10-CM

## 2014-01-04 ENCOUNTER — Other Ambulatory Visit: Payer: Self-pay | Admitting: Geriatric Medicine

## 2014-01-04 DIAGNOSIS — M25512 Pain in left shoulder: Secondary | ICD-10-CM

## 2014-01-04 DIAGNOSIS — L723 Sebaceous cyst: Secondary | ICD-10-CM

## 2014-01-05 ENCOUNTER — Other Ambulatory Visit: Payer: Managed Care, Other (non HMO)

## 2014-01-09 ENCOUNTER — Ambulatory Visit
Admission: RE | Admit: 2014-01-09 | Discharge: 2014-01-09 | Disposition: A | Discharge status: Home / Self Care | Payer: BC Managed Care – PPO | Source: Ambulatory Visit

## 2014-01-09 DIAGNOSIS — Z1231 Encounter for screening mammogram for malignant neoplasm of breast: Secondary | ICD-10-CM

## 2014-01-11 ENCOUNTER — Other Ambulatory Visit: Payer: Self-pay | Admitting: Geriatric Medicine

## 2014-01-11 DIAGNOSIS — R928 Other abnormal and inconclusive findings on diagnostic imaging of breast: Secondary | ICD-10-CM

## 2014-01-22 ENCOUNTER — Ambulatory Visit
Admission: RE | Admit: 2014-01-22 | Discharge: 2014-01-22 | Disposition: A | Discharge status: Home / Self Care | Payer: Self-pay | Source: Ambulatory Visit | Attending: Geriatric Medicine | Admitting: Geriatric Medicine

## 2014-01-22 ENCOUNTER — Ambulatory Visit
Admission: RE | Admit: 2014-01-22 | Discharge: 2014-01-22 | Disposition: A | Discharge status: Home / Self Care | Payer: BC Managed Care – PPO | Source: Ambulatory Visit | Attending: Geriatric Medicine | Admitting: Geriatric Medicine

## 2014-01-22 DIAGNOSIS — R928 Other abnormal and inconclusive findings on diagnostic imaging of breast: Secondary | ICD-10-CM

## 2014-04-18 ENCOUNTER — Other Ambulatory Visit: Payer: Self-pay | Admitting: Gastroenterology

## 2014-04-25 ENCOUNTER — Encounter (HOSPITAL_COMMUNITY): Payer: Self-pay | Admitting: Pharmacy Technician

## 2014-05-04 ENCOUNTER — Encounter (HOSPITAL_COMMUNITY): Payer: Self-pay | Admitting: *Deleted

## 2014-05-08 NOTE — Progress Notes (Signed)
05-08-14 EKG done 05-09-13 report with chart from Dr. Felipa Eth.W. Floy Sabina

## 2014-05-14 ENCOUNTER — Ambulatory Visit (HOSPITAL_COMMUNITY): Payer: Medicare Other

## 2014-05-14 ENCOUNTER — Ambulatory Visit (HOSPITAL_COMMUNITY): Payer: Medicare Other | Admitting: Anesthesiology

## 2014-05-14 ENCOUNTER — Encounter (HOSPITAL_COMMUNITY): Payer: Self-pay

## 2014-05-14 ENCOUNTER — Encounter (HOSPITAL_COMMUNITY): Payer: Medicare Other | Admitting: Anesthesiology

## 2014-05-14 ENCOUNTER — Encounter (HOSPITAL_COMMUNITY)
Admission: RE | Disposition: A | Discharge status: Home / Self Care | Payer: Self-pay | Source: Ambulatory Visit | Attending: Gastroenterology

## 2014-05-14 ENCOUNTER — Ambulatory Visit (HOSPITAL_COMMUNITY)
Admission: RE | Admit: 2014-05-14 | Discharge: 2014-05-14 | Disposition: A | Discharge status: Home / Self Care | Payer: Medicare Other | Source: Ambulatory Visit | Attending: Gastroenterology | Admitting: Gastroenterology

## 2014-05-14 DIAGNOSIS — K3184 Gastroparesis: Secondary | ICD-10-CM | POA: Diagnosis not present

## 2014-05-14 DIAGNOSIS — D509 Iron deficiency anemia, unspecified: Secondary | ICD-10-CM | POA: Insufficient documentation

## 2014-05-14 DIAGNOSIS — I10 Essential (primary) hypertension: Secondary | ICD-10-CM | POA: Insufficient documentation

## 2014-05-14 DIAGNOSIS — G43909 Migraine, unspecified, not intractable, without status migrainosus: Secondary | ICD-10-CM | POA: Insufficient documentation

## 2014-05-14 DIAGNOSIS — Z79899 Other long term (current) drug therapy: Secondary | ICD-10-CM | POA: Diagnosis not present

## 2014-05-14 DIAGNOSIS — E7800 Pure hypercholesterolemia, unspecified: Secondary | ICD-10-CM | POA: Diagnosis not present

## 2014-05-14 DIAGNOSIS — K5730 Diverticulosis of large intestine without perforation or abscess without bleeding: Secondary | ICD-10-CM | POA: Insufficient documentation

## 2014-05-14 DIAGNOSIS — E1149 Type 2 diabetes mellitus with other diabetic neurological complication: Secondary | ICD-10-CM | POA: Insufficient documentation

## 2014-05-14 DIAGNOSIS — G932 Benign intracranial hypertension: Secondary | ICD-10-CM | POA: Diagnosis not present

## 2014-05-14 DIAGNOSIS — Z90710 Acquired absence of both cervix and uterus: Secondary | ICD-10-CM | POA: Insufficient documentation

## 2014-05-14 DIAGNOSIS — K219 Gastro-esophageal reflux disease without esophagitis: Secondary | ICD-10-CM | POA: Diagnosis not present

## 2014-05-14 HISTORY — DX: Family history of other specified conditions: Z84.89

## 2014-05-14 HISTORY — PX: COLONOSCOPY WITH PROPOFOL: SHX5780

## 2014-05-14 HISTORY — PX: ESOPHAGOGASTRODUODENOSCOPY (EGD) WITH PROPOFOL: SHX5813

## 2014-05-14 LAB — GLUCOSE, CAPILLARY: GLUCOSE-CAPILLARY: 90 mg/dL (ref 70–99)

## 2014-05-14 SURGERY — COLONOSCOPY WITH PROPOFOL
Anesthesia: Monitor Anesthesia Care

## 2014-05-14 MED ORDER — FENTANYL CITRATE 0.05 MG/ML IJ SOLN
INTRAMUSCULAR | Status: AC
  Filled 2014-05-14: qty 2

## 2014-05-14 MED ORDER — MIDAZOLAM HCL 2 MG/2ML IJ SOLN
INTRAMUSCULAR | Status: AC
  Filled 2014-05-14: qty 2

## 2014-05-14 MED ORDER — BUTAMBEN-TETRACAINE-BENZOCAINE 2-2-14 % EX AERO
INHALATION_SPRAY | CUTANEOUS | Status: DC | PRN
  Administered 2014-05-14: 2 via TOPICAL

## 2014-05-14 MED ORDER — FENTANYL CITRATE 0.05 MG/ML IJ SOLN
INTRAMUSCULAR | Status: DC | PRN
  Administered 2014-05-14: 50 ug via INTRAVENOUS

## 2014-05-14 MED ORDER — PROPOFOL 10 MG/ML IV BOLUS
INTRAVENOUS | Status: DC | PRN
  Administered 2014-05-14: 20 mg via INTRAVENOUS
  Administered 2014-05-14: 40 mg via INTRAVENOUS
  Administered 2014-05-14: 20 mg via INTRAVENOUS
  Administered 2014-05-14 (×2): 40 mg via INTRAVENOUS

## 2014-05-14 MED ORDER — PROPOFOL 10 MG/ML IV BOLUS
INTRAVENOUS | Status: AC
  Filled 2014-05-14: qty 20

## 2014-05-14 MED ORDER — MIDAZOLAM HCL 5 MG/5ML IJ SOLN
INTRAMUSCULAR | Status: DC | PRN
  Administered 2014-05-14: 1 mg via INTRAVENOUS

## 2014-05-14 MED ORDER — SODIUM CHLORIDE 0.9 % IV SOLN: INTRAVENOUS | Status: DC

## 2014-05-14 MED ORDER — LACTATED RINGERS IV SOLN
INTRAVENOUS | Status: DC
  Administered 2014-05-14: 1000 mL via INTRAVENOUS

## 2014-05-14 SURGICAL SUPPLY — 25 items

## 2014-05-14 NOTE — H&P (Signed)
  Problem: Unexplained iron deficiency anemia. Hemoglobin 10.7 g. Serum iron saturation 7%. Esophagogastroduodenoscopy with dilation of a benign distal esophageal stricture performed 07/04/2013. Normal screening flexible proctosigmoidoscopy with air contrast barium enema performed 08/01/2007.  History: The patient is a 63 year old female with unexplained iron deficiency anemia unassociated with overt gastrointestinal bleeding.  The patient reports no dysphagia. She stop taking aspirin. She does not take other nonsteroidal anti-inflammatory medication.  The patient is scheduled to undergo diagnostic esophagogastroduodenoscopy and colonoscopy  Past medical history: Fibrocystic breast disease. Colonic diverticulosis. Migraine headache syndrome. Pseudotumor cerebri. Hypercholesterolemia. Type 2 diabetes mellitus with gastroparesis. Gastroesophageal reflux complicated by a distal esophageal stricture. Hypertension. Fatty appearing liver. Bilateral kidney stones. Bilateral carotid artery stenosis 50%. Appendectomy. Ovarian cyst removal. Laparoscopic adhesion lysis. Total abdominal hysterectomy. Bilateral salpingo-oophorectomy. Bladder suspension surgery. Ventriculoperitoneal shunt placement. Left ankle arthroscopy  Medication allergies: Penicillin. Accupril. Lipitor. Zetia. Zocor. Pravachol. Crestor. Tegretol. Reglan. WelChol. Rifampin  Exam: The patient is alert and lying comfortably on the endoscopy stretcher. Lungs are clear to auscultation. Cardiac exam reveals a regular rhythm. Abdomen is soft and nontender to palpation.  Plan: Proceed with diagnostic esophagogastroduodenoscopy and colonoscopy

## 2014-05-14 NOTE — Op Note (Signed)
Problem: Unexplained iron deficiency anemia. Hemoglobin 10.7 g. Serum iron saturation 7%. Normal screening flexible proctosigmoidoscopy and air-contrast barium enema performed on 08/01/2007.  Endoscopist: Earle Gell  Premedication: Propofol administered by anesthesia  Procedure: Diagnostic esophagogastroduodenoscopy The patient was placed in the left lateral decubitus position. The Pentax gastroscope was passed through the posterior hypopharynx into the proximal esophagus without difficulty. The hypopharynx, larynx, and vocal cords appeared normal.  Esophagoscopy: The proximal, mid, and lower segments of the esophageal mucosa appeared normal. The squamocolumnar junction is regular in appearance and noted at 35 cm from the incisor teeth.  Gastroscopy: There was a moderate sized hiatal hernia. Retroflexed view of the gastric cardia and fundus was normal. The gastric body, antrum, and pylorus appeared normal.  Duodenoscopy: The duodenal bulb and descending duodenum appeared normal.  Assessment: Normal esophagogastroduodenoscopy  Procedure: Diagnostic colonoscopy Anal inspection and digital rectal exam were normal. The Pentax pediatric colonoscope was introduced into the rectum and advanced to the cecum. A normal-appearing ileocecal valve and appendiceal orifice were identified. Colonic preparation for the exam today was good.  The patient has universal colonic diverticulosis  Rectum. Normal. Retroflex view of the distal rectum normal  Sigmoid colon and descending colon. Normal  Splenic flexure. Normal  Transverse colon. Normal hepatic flexure. Normal  Hepatic flexure. Normal  Ascending colon. Normal  Cecum and ileocecal valve. Normal.  Assessment:  #1. Universal colonic diverticulosis  #2. Otherwise normal diagnostic proctocolonoscopy to the cecum  Recommendation: Monitor the patient's response to oral iron while she remains off aspirin. If her hemoglobin does not normalize  on oral iron, scheduled CT enterography.

## 2014-05-14 NOTE — Transfer of Care (Signed)
Immediate Anesthesia Transfer of Care Note  Patient: [name]  Procedure(s) Performed: Procedure(s): COLONOSCOPY WITH PROPOFOL (N/A) ESOPHAGOGASTRODUODENOSCOPY (EGD) WITH PROPOFOL (N/A)  Patient Location: PACU and Endoscopy Unit  Anesthesia Type:MAC  Level of Consciousness: awake and alert   Airway & Oxygen Therapy: Patient Spontanous Breathing and Patient connected to nasal cannula oxygen  Post-op Assessment: Report given to PACU RN and Post -op Vital signs reviewed and stable  Post vital signs: Reviewed and stable  Complications: No apparent anesthesia complications

## 2014-05-14 NOTE — Anesthesia Preprocedure Evaluation (Signed)
Anesthesia Evaluation  Patient identified by MRN, date of birth, ID band Patient awake    Reviewed: Allergy & Precautions, H&P , NPO status , Patient's Chart, lab work & pertinent test results, reviewed documented beta blocker date and time   History of Anesthesia Complications (+) PONV and history of anesthetic complications  Airway Mallampati: II TM Distance: >3 FB Neck ROM: full    Dental   Pulmonary neg pulmonary ROS,  breath sounds clear to auscultation        Cardiovascular hypertension, Pt. on home beta blockers and On Medications Rhythm:regular     Neuro/Psych  Headaches, Seizures -,   Neuromuscular disease negative psych ROS   GI/Hepatic Neg liver ROS, GERD-  Medicated,  Endo/Other  diabetes, Type 2, Insulin Dependent, Oral Hypoglycemic Agents  Renal/GU negative Renal ROS     Musculoskeletal   Abdominal   Peds  Hematology  (+) anemia ,   Anesthesia Other Findings See surgeon's H&P   Reproductive/Obstetrics negative OB ROS                           Anesthesia Physical  Anesthesia Plan  ASA: II  Anesthesia Plan: MAC   Post-op Pain Management:    Induction: Intravenous  Airway Management Planned:   Additional Equipment:   Intra-op Plan:   Post-operative Plan:   Informed Consent: I have reviewed the patients History and Physical, chart, labs and discussed the procedure including the risks, benefits and alternatives for the proposed anesthesia with the patient or authorized representative who has indicated his/her understanding and acceptance.   Dental Advisory Given  Plan Discussed with: CRNA  Anesthesia Plan Comments:         Anesthesia Quick Evaluation

## 2014-05-14 NOTE — Anesthesia Postprocedure Evaluation (Signed)
Anesthesia Post Note  Patient: [name]  Procedure(s) Performed: Procedure(s) (LRB): COLONOSCOPY WITH PROPOFOL (N/A) ESOPHAGOGASTRODUODENOSCOPY (EGD) WITH PROPOFOL (N/A)  Anesthesia type: MAC  Patient location: PACU  Post pain: Pain level controlled  Post assessment: Post-op Vital signs reviewed  Last Vitals: BP 221/91  Pulse 73  Temp(Src) 36.4 C (Oral)  Resp 14  SpO2 100%  Post vital signs: Reviewed  Level of consciousness: awake  Complications: No apparent anesthesia complications

## 2014-05-14 NOTE — Discharge Instructions (Addendum)
Gastrointestinal Endoscopy, Care After °Refer to this sheet in the next few weeks. These instructions provide you with information on caring for yourself after your procedure. Your caregiver may also give you more specific instructions. Your treatment has been planned according to current medical practices, but problems sometimes occur. Call your caregiver if you have any problems or questions after your procedure. °HOME CARE INSTRUCTIONS °· If you were given medicine to help you relax (sedative), do not drive, operate machinery, or sign important documents for 24 hours. °· Avoid alcohol and hot or warm beverages for the first 24 hours after the procedure. °· Only take over-the-counter or prescription medicines for pain, discomfort, or fever as directed by your caregiver. You may resume taking your normal medicines unless your caregiver tells you otherwise. Ask your caregiver when you may resume taking medicines that may cause bleeding, such as aspirin, clopidogrel, or warfarin. °· You may return to your normal diet and activities on the day after your procedure, or as directed by your caregiver. Walking may help to reduce any bloated feeling in your abdomen. °· Drink enough fluids to keep your urine clear or pale yellow. °· You may gargle with salt water if you have a sore throat. °SEEK IMMEDIATE MEDICAL CARE IF: °· You have severe nausea or vomiting. °· You have severe abdominal pain, abdominal cramps that last longer than 6 hours, or abdominal swelling (distention). °· You have severe shoulder or back pain. °· You have trouble swallowing. °· You have shortness of breath, your breathing is shallow, or you are breathing faster than normal. °· You have a fever or a rapid heartbeat. °· You vomit blood or material that looks like coffee grounds. °· You have bloody, black, or tarry stools. °MAKE SURE YOU: °· Understand these instructions. °· Will watch your condition. °· Will get help right away if you are not doing  well or get worse. °Document Released: 05/19/2004 Document Revised: 02/19/2014 Document Reviewed: 01/05/2012 °[employer]® Patient Information ©2015 [employer]. This information is not intended to replace advice given to you by your health care provider. Make sure you discuss any questions you have with your health care provider. ° °

## 2014-05-15 ENCOUNTER — Encounter (HOSPITAL_COMMUNITY): Payer: Self-pay | Admitting: Gastroenterology

## 2014-05-28 ENCOUNTER — Encounter (HOSPITAL_COMMUNITY): Payer: Self-pay

## 2014-05-28 ENCOUNTER — Other Ambulatory Visit (HOSPITAL_COMMUNITY): Payer: Self-pay | Admitting: Gastroenterology

## 2014-05-28 ENCOUNTER — Ambulatory Visit (HOSPITAL_COMMUNITY)
Admission: RE | Admit: 2014-05-28 | Discharge: 2014-05-28 | Disposition: A | Discharge status: Home / Self Care | Payer: Medicare Other | Source: Ambulatory Visit | Attending: Gastroenterology | Admitting: Gastroenterology

## 2014-05-28 DIAGNOSIS — E611 Iron deficiency: Secondary | ICD-10-CM

## 2014-05-28 DIAGNOSIS — D509 Iron deficiency anemia, unspecified: Secondary | ICD-10-CM | POA: Diagnosis present

## 2014-05-28 LAB — POCT I-STAT CREATININE: Creatinine, Ser: 0.9 mg/dL (ref 0.50–1.10)

## 2014-05-28 MED ORDER — IOHEXOL 300 MG/ML  SOLN
100.0000 mL | Freq: Once | INTRAMUSCULAR | Status: AC | PRN
  Administered 2014-05-28: 100 mL via INTRAVENOUS

## 2014-07-11 ENCOUNTER — Other Ambulatory Visit: Payer: Self-pay | Admitting: Orthopedic Surgery

## 2014-07-11 DIAGNOSIS — M7750 Other enthesopathy of unspecified foot: Secondary | ICD-10-CM

## 2014-07-19 ENCOUNTER — Ambulatory Visit
Admission: RE | Admit: 2014-07-19 | Discharge: 2014-07-19 | Disposition: A | Discharge status: Home / Self Care | Payer: Medicare Other | Source: Ambulatory Visit | Attending: Orthopedic Surgery | Admitting: Orthopedic Surgery

## 2014-07-19 DIAGNOSIS — M7750 Other enthesopathy of unspecified foot: Secondary | ICD-10-CM

## 2014-07-19 MED ORDER — GADOBENATE DIMEGLUMINE 529 MG/ML IV SOLN
15.0000 mL | Freq: Once | INTRAVENOUS | Status: AC | PRN
  Administered 2014-07-19: 15 mL via INTRAVENOUS

## 2014-09-25 ENCOUNTER — Other Ambulatory Visit: Payer: Self-pay | Admitting: Geriatric Medicine

## 2014-09-25 DIAGNOSIS — I10 Essential (primary) hypertension: Secondary | ICD-10-CM

## 2014-10-03 ENCOUNTER — Ambulatory Visit
Admission: RE | Admit: 2014-10-03 | Discharge: 2014-10-03 | Disposition: A | Discharge status: Home / Self Care | Payer: Medicare Other | Source: Ambulatory Visit | Attending: Geriatric Medicine | Admitting: Geriatric Medicine

## 2014-10-03 DIAGNOSIS — I10 Essential (primary) hypertension: Secondary | ICD-10-CM

## 2014-10-03 MED ORDER — GADOBENATE DIMEGLUMINE 529 MG/ML IV SOLN
17.0000 mL | Freq: Once | INTRAVENOUS | Status: AC | PRN
  Administered 2014-10-03: 17 mL via INTRAVENOUS

## 2014-10-29 ENCOUNTER — Other Ambulatory Visit: Payer: Self-pay | Admitting: Geriatric Medicine

## 2014-10-29 ENCOUNTER — Ambulatory Visit
Admission: RE | Admit: 2014-10-29 | Discharge: 2014-10-29 | Disposition: A | Discharge status: Home / Self Care | Payer: Medicare Other | Source: Ambulatory Visit | Attending: Geriatric Medicine | Admitting: Geriatric Medicine

## 2014-10-29 DIAGNOSIS — M25562 Pain in left knee: Secondary | ICD-10-CM

## 2015-01-14 ENCOUNTER — Other Ambulatory Visit: Payer: Self-pay

## 2015-01-14 DIAGNOSIS — Z1231 Encounter for screening mammogram for malignant neoplasm of breast: Secondary | ICD-10-CM

## 2015-01-22 ENCOUNTER — Ambulatory Visit
Admission: RE | Admit: 2015-01-22 | Discharge: 2015-01-22 | Disposition: A | Discharge status: Home / Self Care | Payer: Medicare Other | Source: Ambulatory Visit

## 2015-01-22 DIAGNOSIS — Z1231 Encounter for screening mammogram for malignant neoplasm of breast: Secondary | ICD-10-CM

## 2015-02-06 ENCOUNTER — Emergency Department (HOSPITAL_COMMUNITY)
Admission: EM | Admit: 2015-02-06 | Discharge: 2015-02-06 | Disposition: A | Discharge status: Home / Self Care | Payer: Medicare Other | Attending: Emergency Medicine | Admitting: Emergency Medicine

## 2015-02-06 ENCOUNTER — Ambulatory Visit
Admission: RE | Admit: 2015-02-06 | Discharge: 2015-02-06 | Disposition: A | Discharge status: Home / Self Care | Payer: Medicare Other | Source: Ambulatory Visit | Attending: Nurse Practitioner | Admitting: Nurse Practitioner

## 2015-02-06 ENCOUNTER — Other Ambulatory Visit: Payer: Self-pay | Admitting: Nurse Practitioner

## 2015-02-06 ENCOUNTER — Emergency Department (HOSPITAL_COMMUNITY): Payer: Medicare Other

## 2015-02-06 ENCOUNTER — Encounter (HOSPITAL_COMMUNITY): Payer: Self-pay

## 2015-02-06 DIAGNOSIS — K219 Gastro-esophageal reflux disease without esophagitis: Secondary | ICD-10-CM | POA: Insufficient documentation

## 2015-02-06 DIAGNOSIS — Z8739 Personal history of other diseases of the musculoskeletal system and connective tissue: Secondary | ICD-10-CM | POA: Diagnosis not present

## 2015-02-06 DIAGNOSIS — I10 Essential (primary) hypertension: Secondary | ICD-10-CM | POA: Insufficient documentation

## 2015-02-06 DIAGNOSIS — Z791 Long term (current) use of non-steroidal anti-inflammatories (NSAID): Secondary | ICD-10-CM | POA: Insufficient documentation

## 2015-02-06 DIAGNOSIS — Z79899 Other long term (current) drug therapy: Secondary | ICD-10-CM | POA: Insufficient documentation

## 2015-02-06 DIAGNOSIS — J40 Bronchitis, not specified as acute or chronic: Secondary | ICD-10-CM | POA: Diagnosis not present

## 2015-02-06 DIAGNOSIS — R0602 Shortness of breath: Secondary | ICD-10-CM

## 2015-02-06 DIAGNOSIS — Z880 Allergy status to penicillin: Secondary | ICD-10-CM | POA: Insufficient documentation

## 2015-02-06 DIAGNOSIS — Z8669 Personal history of other diseases of the nervous system and sense organs: Secondary | ICD-10-CM | POA: Insufficient documentation

## 2015-02-06 DIAGNOSIS — Z794 Long term (current) use of insulin: Secondary | ICD-10-CM | POA: Insufficient documentation

## 2015-02-06 DIAGNOSIS — E119 Type 2 diabetes mellitus without complications: Secondary | ICD-10-CM | POA: Insufficient documentation

## 2015-02-06 DIAGNOSIS — D649 Anemia, unspecified: Secondary | ICD-10-CM | POA: Diagnosis not present

## 2015-02-06 LAB — BASIC METABOLIC PANEL
Anion gap: 11 (ref 5–15)
BUN: 11 mg/dL (ref 6–23)
CHLORIDE: 102 mmol/L (ref 96–112)
CO2: 27 mmol/L (ref 19–32)
CREATININE: 0.83 mg/dL (ref 0.50–1.10)
Calcium: 9 mg/dL (ref 8.4–10.5)
GFR calc Af Amer: 85 mL/min — ABNORMAL LOW (ref >90)
GFR calc non Af Amer: 73 mL/min — ABNORMAL LOW (ref >90)
Glucose, Bld: 154 mg/dL — ABNORMAL HIGH (ref 70–99)
Potassium: 3.6 mmol/L (ref 3.5–5.1)
Sodium: 140 mmol/L (ref 135–145)

## 2015-02-06 LAB — CBC
HEMATOCRIT: 35.1 % — AB (ref 36.0–46.0)
Hemoglobin: 10.9 g/dL — ABNORMAL LOW (ref 12.0–15.0)
MCH: 27.9 pg (ref 26.0–34.0)
MCHC: 31.1 g/dL (ref 30.0–36.0)
MCV: 89.8 fL (ref 78.0–100.0)
PLATELETS: 278 10*3/uL (ref 150–400)
RBC: 3.91 MIL/uL (ref 3.87–5.11)
RDW: 16 % — AB (ref 11.5–15.5)
WBC: 4.6 10*3/uL (ref 4.0–10.5)

## 2015-02-06 LAB — I-STAT CHEM 8, ED
BUN: 10 mg/dL (ref 6–23)
CALCIUM ION: 1.09 mmol/L — AB (ref 1.13–1.30)
CHLORIDE: 101 mmol/L (ref 96–112)
Creatinine, Ser: 1 mg/dL (ref 0.50–1.10)
GLUCOSE: 158 mg/dL — AB (ref 70–99)
HCT: 35 % — ABNORMAL LOW (ref 36.0–46.0)
Hemoglobin: 11.9 g/dL — ABNORMAL LOW (ref 12.0–15.0)
Potassium: 3.6 mmol/L (ref 3.5–5.1)
Sodium: 139 mmol/L (ref 135–145)
TCO2: 24 mmol/L (ref 0–100)

## 2015-02-06 LAB — I-STAT TROPONIN, ED: Troponin i, poc: 0 ng/mL (ref 0.00–0.08)

## 2015-02-06 MED ORDER — IOHEXOL 350 MG/ML SOLN
100.0000 mL | Freq: Once | INTRAVENOUS | Status: AC | PRN
  Administered 2015-02-06: 100 mL via INTRAVENOUS

## 2015-02-06 MED ORDER — IPRATROPIUM-ALBUTEROL 0.5-2.5 (3) MG/3ML IN SOLN
3.0000 mL | Freq: Once | RESPIRATORY_TRACT | Status: AC
  Administered 2015-02-06: 3 mL via RESPIRATORY_TRACT
  Filled 2015-02-06: qty 3

## 2015-02-06 NOTE — ED Notes (Signed)
Returned from CT.

## 2015-02-06 NOTE — ED Notes (Addendum)
Pt states shortness of breath with cough/wheezing since Sunday.  Went to MD today.  Pt had labs.  Told elevated enzymes (? D dimer) and told to come to ED.  No fever.  Pt states pain in chest, throat, stomach, back and kidney

## 2015-02-06 NOTE — Discharge Instructions (Signed)

## 2015-02-06 NOTE — ED Notes (Signed)
Patient transported to CT 

## 2015-02-06 NOTE — ED Provider Notes (Signed)
CSN: [id number]     Arrival date & time 02/06/15  1805 History   First MD Initiated Contact with Patient 02/06/15 1837     Chief Complaint  Patient presents with  . Shortness of Breath  . Abnormal Lab   HPI Patient presents to the emergency room with complaints of coughing and wheezing since Sunday. Patient states she has been feeling short of breath and coughing very frequently. She notices her symptoms primarily after bad coughing spells. SHe has noticed some pain in her chest as well as her throat. She was seen at her primary doctor's office today and was given antibiotics and a prescription for inhalers. Patient states she was called by her doctor and was told that one in the enzymes were elevated and she needed to come to the emergency room to make sure she wasn't having a blood clot in the lungs. Patient did not bring any records with her. We did not receive a phone call from the primary doctor's office. Past Medical History  Diagnosis Date  . GERD (gastroesophageal reflux disease)   . Wears dentures     full  . Pseudotumor cerebri     has lumbar peritoneal shunt  . PONV (postoperative nausea and vomiting)   . 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 Headache(784.0)     due to pseudotumor; daily headache  . Anemia     takes iron supplement  . Hypertension     under control with meds., has been on med. x 5 yr.  . Diabetes mellitus     IDDM  . Anesthesia complication     disorientation due to pseudotumor  . 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 Peripheral neuropathy   . Hypercholesteremia     unable to tolerate statins  . Family history of anesthesia complication 23 yrs ago    brother stopped breathing for a minute or two   Past Surgical History  Procedure Laterality Date  . Appendectomy      as a child  . Bladder suspension    . Lumbar peritoneal shunt      x 2  . Total abdominal hysterectomy w/ bilateral salpingoophorectomy      age 80s  . Ovarian  cyst removal      age 41  . Shunt revision  2007  . Breast lumpectomy w/ needle localization Left 05/22/2011  . Esophageal dilation  06/23/2006; 08/05/2004  . Ankle arthroscopy Left 05/18/2013    Procedure: LEFT ANKLE ARTHROSCOPY WITH DEBRIDEMENT,  SUBTALAR OPEN DEBRIDEMENT ;  Surgeon: Wylene Simmer, MD;  Location: [facility];  Service: Orthopedics;  Laterality: Left;  . Esophagogastroduodenoscopy N/A 07/04/2013    Procedure: ESOPHAGOGASTRODUODENOSCOPY (EGD);  Surgeon: Garlan Fair, MD;  Location: [facility];  Service: Endoscopy;  Laterality: N/A;  . Balloon dilation N/A 07/04/2013    Procedure: BALLOON DILATION;  Surgeon: Garlan Fair, MD;  Location: [facility];  Service: Endoscopy;  Laterality: N/A;  . Wrist surgery Left 2014     [name]  . Colonoscopy with propofol N/A 05/14/2014    Procedure: COLONOSCOPY WITH PROPOFOL;  Surgeon: Garlan Fair, MD;  Location: [facility];  Service: Endoscopy;  Laterality: N/A;  . Esophagogastroduodenoscopy (egd) with propofol N/A 05/14/2014    Procedure: ESOPHAGOGASTRODUODENOSCOPY (EGD) WITH PROPOFOL;  Surgeon: Garlan Fair, MD;  Location: [facility];  Service: Endoscopy;  Laterality: N/A;   Family History  Problem Relation Age of Onset  .  Heart disease Father    History  Substance Use Topics  . Smoking status: Never Smoker   . Smokeless tobacco: Never Used  . Alcohol Use: No   OB History    No data available     Review of Systems  All other systems reviewed and are negative.     Allergies  Penicillins; Carbamazepine; Statins; and Tricyclic antidepressants  Home Medications   Prior to Admission medications   Medication Sig Start Date End Date Taking? Authorizing Provider  amLODipine (NORVASC) 10 MG tablet Take 10 mg by mouth every evening.   Yes Historical Provider, MD  cloNIDine (CATAPRES) 0.3 MG tablet Take 0.3 mg by mouth 2 (two) times daily.    Yes Historical Provider, MD  divalproex (DEPAKOTE ER) 250  MG 24 hr tablet Take 250 mg by mouth 3 (three) times daily.    Yes Historical Provider, MD  esomeprazole (NEXIUM) 40 MG capsule Take 40 mg by mouth daily as needed (acid reflux). Acid reflux   Yes Historical Provider, MD  estropipate (OGEN) 1.5 MG tablet Take 1.5 mg by mouth daily.    Yes Historical Provider, MD  ferrous sulfate dried (SLOW FE) 160 (50 FE) MG TBCR Take 160 mg by mouth 2 (two) times daily with a meal.   Yes Historical Provider, MD  glipiZIDE (GLUCOTROL) 10 MG tablet Take 10 mg by mouth 2 (two) times daily before a meal.    Yes Historical Provider, MD  insulin glargine (LANTUS) 100 UNIT/ML injection Inject 18 Units into the skin at bedtime.    Yes Historical Provider, MD  metFORMIN (GLUMETZA) 500 MG (MOD) 24 hr tablet Take 1,000 mg by mouth 2 (two) times daily with a meal.   Yes Historical Provider, MD  metoprolol (LOPRESSOR) 50 MG tablet Take 50 mg by mouth every evening.   Yes Historical Provider, MD  metoprolol (TOPROL-XL) 100 MG 24 hr tablet Take 100 mg by mouth daily.    Yes Historical Provider, MD  telmisartan (MICARDIS) 80 MG tablet Take 80 mg by mouth every evening.    Yes Historical Provider, MD  diclofenac sodium (VOLTAREN) 1 % GEL Apply 2 g topically 4 (four) times daily as needed (pain).     Historical Provider, MD  PRESCRIPTION MEDICATION Take 20 mg by mouth 3 (three) times daily.    Historical Provider, MD   BP 168/74 mmHg  Pulse 104  Temp(Src) 97.7 F (36.5 C) (Oral)  Resp 15  SpO2 95% Physical Exam  Constitutional: She appears well-developed and well-nourished. No distress.  HENT:  Head: Normocephalic and atraumatic.  Right Ear: External ear normal.  Left Ear: External ear normal.  Eyes: Conjunctivae are normal. Right eye exhibits no discharge. Left eye exhibits no discharge. No scleral icterus.  Neck: Neck supple. No tracheal deviation present.  Cardiovascular: Normal rate, regular rhythm and intact distal pulses.   Pulmonary/Chest: Effort normal. No  stridor. No respiratory distress. She has wheezes (occasional wheezes noted after coughing spells). She has no rales.  Abdominal: Soft. Bowel sounds are normal. She exhibits no distension. There is no tenderness. There is no rebound and no guarding.  Musculoskeletal: She exhibits no edema or tenderness.  Neurological: She is alert. She has normal strength. No cranial nerve deficit (no facial droop, extraocular movements intact, no slurred speech) or sensory deficit. She exhibits normal muscle tone. She displays no seizure activity. Coordination normal.  Skin: Skin is warm and dry. No rash noted.  Psychiatric: She has a normal mood and affect.  Nursing note and vitals reviewed.   ED Course  Procedures (including critical care time) Labs Review Labs Reviewed  CBC - Abnormal; Notable for the following:    Hemoglobin 10.9 (*)    HCT 35.1 (*)    RDW 16.0 (*)    All other components within normal limits  BASIC METABOLIC PANEL - Abnormal; Notable for the following:    Glucose, Bld 154 (*)    GFR calc non Af Amer 73 (*)    GFR calc Af Amer 85 (*)    All other components within normal limits  I-STAT CHEM 8, ED - Abnormal; Notable for the following:    Glucose, Bld 158 (*)    Calcium, Ion 1.09 (*)    Hemoglobin 11.9 (*)    HCT 35.0 (*)    All other components within normal limits  Randolm Idol, ED    Imaging Review Dg Chest 2 View  02/06/2015   CLINICAL DATA:  Shortness of breath.  Cough and wheezing .  EXAM: CHEST  2 VIEW  COMPARISON:  None.  FINDINGS: Mediastinum and hilar structures are normal. Lungs are clear. No pleural effusion or pneumothorax. Stable left base pleural parenchymal thickening consistent scarring. Heart size stable. Pulmonary vascularity normal. No acute bony abnormality .  IMPRESSION: Left base pleural parenchymal scarring. No acute cardiopulmonary disease.   Electronically Signed   By: Marcello Moores  Register   On: 02/06/2015 13:31   Ct Angio Chest Pe W/cm &/or Wo  Cm  02/06/2015   CLINICAL DATA:  64 year old female with shortness of breath and elevated D-dimer  EXAM: CT ANGIOGRAPHY CHEST WITH CONTRAST  TECHNIQUE: Multidetector CT imaging of the chest was performed using the standard protocol during bolus administration of intravenous contrast. Multiplanar CT image reconstructions and MIPs were obtained to evaluate the vascular anatomy.  CONTRAST:  118mL OMNIPAQUE IOHEXOL 350 MG/ML SOLN  COMPARISON:  Chest x-ray obtained earlier today  FINDINGS: Mediastinum: Unremarkable CT appearance of the thyroid gland. No suspicious mediastinal or hilar adenopathy. No soft tissue mediastinal mass. Small hiatal hernia.  Heart/Vascular: Adequate opacification of the pulmonary arteries to the segmental level. No evidence of segmental or more central pulmonary embolus. Normal size main pulmonary artery. No evidence of aortic dilatation or dissection. 3 vessel arch anatomy. Great vessels are tortuous. Borderline cardiomegaly. No pericardial effusion.  Lungs/Pleura: The lungs are clear.  No pleural effusion.  Bones/Soft Tissues: No acute fracture or aggressive appearing lytic or blastic osseous lesion.  Upper Abdomen: Abandoned pole ventriculoperitoneal shunt tubing anterior and lateral to the liver. Otherwise, unremarkable upper abdomen.  Review of the MIP images confirms the above findings.  IMPRESSION: 1. Negative for acute pulmonary embolus, pneumonia or other acute cardiopulmonary abnormality. 2. Borderline cardiomegaly.   Electronically Signed   By: Jacqulynn Cadet M.D.   On: 02/06/2015 20:28     EKG Interpretation   Date/Time:  Wednesday February 06 2015 18:33:18 EDT Ventricular Rate:  100 PR Interval:  144 QRS Duration: 81 QT Interval:  357 QTC Calculation: 460 R Axis:   56 Text Interpretation:  Sinus tachycardia Low voltage, precordial leads  Borderline repol abnrm, anterolateral leads , new since last tracing  Confirmed by Danaka Llera  MD-J, Dhanvi Boesen ([id number]) on 02/06/2015 6:59:15  PM      MDM   Final diagnoses:  Bronchitis    Pt's CT scan does not show pulmonary embolism.  She was already given abx and other meds to take by her PCP.  Discussed findings and plan with the patient  and her family.    Dorie Rank, MD 02/06/15 [phone]

## 2015-09-02 ENCOUNTER — Encounter: Payer: Self-pay | Admitting: Podiatry

## 2015-09-02 ENCOUNTER — Ambulatory Visit (INDEPENDENT_AMBULATORY_CARE_PROVIDER_SITE_OTHER): Payer: Medicare Other | Admitting: Podiatry

## 2015-09-02 VITALS — BP 170/101 | HR 78 | Resp 16 | Ht 62.0 in | Wt 175.0 lb

## 2015-09-02 DIAGNOSIS — L600 Ingrowing nail: Secondary | ICD-10-CM

## 2015-09-02 DIAGNOSIS — M779 Enthesopathy, unspecified: Secondary | ICD-10-CM | POA: Diagnosis not present

## 2015-09-02 NOTE — Patient Instructions (Signed)
Long Term Care Instructions-Post Nail Surgery  You have had your ingrown toenail and root treated with a chemical.  This chemical causes a burn that will drain and ooze like a blister.  This can drain for 6-8 weeks or longer.  It is important to keep this area clean, covered, and follow the soaking instructions dispensed at the time of your surgery.  This area will eventually dry and form a scab.  Once the scab forms you no longer need to soak or apply a dressing.  If at any time you experience an increase in pain, redness, swelling, or drainage, you should contact the office as soon as possible.ANTIBACTERIAL SOAP INSTRUCTIONS  THE DAY AFTER PROCEDURE  Please follow the instructions your doctor has marked.   Shower as usual. Before getting out, place a drop of antibacterial liquid soap (Dial) on a wet, clean washcloth.  Gently wipe washcloth over affected area.  Afterward, rinse the area with warm water.  Blot the area dry with a soft cloth and cover with antibiotic ointment (neosporin, polysporin, bacitracin) and band aid or gauze and tape  Place 3-4 drops of antibacterial liquid soap in a quart of warm tap water.  Submerge foot into water for 20 minutes.  If bandage was applied after your procedure, leave on to allow for easy lift off, then remove and continue with soak for the remaining time.  Next, blot area dry with a soft cloth and cover with a bandage.  Apply other medications as directed by your doctor, such as cortisporin otic solution (eardrops) or neosporin antibiotic ointment 

## 2015-09-02 NOTE — Progress Notes (Signed)
   Subjective:    Patient ID: [name], female    DOB: 04-04-1951, 64 y.o.   MRN: [id number]  HPI Patient presents with needing a diabetic foot exam. Pt is diabetic, type 2, since 2000; neuropathy; Sugar=did not take this am; A1C=6.8. Pt wants basic diabetic foot exam done.  Patient also presents with a bilateral nail problem, great toes, 2nd toes; nail discoloration & thickened nails.    Review of Systems  All other systems reviewed and are negative.      Objective:   Physical Exam        Assessment & Plan:

## 2015-09-02 NOTE — Progress Notes (Signed)
Subjective:     Patient ID: [name], female   DOB: Oct 16, 1951, 64 y.o.   MRN: [id number]  HPI patient presents stating I have had neuropathy I have a lot of pain in my left ankle and had surgery and independent numerous physicians with not been able to help me and my right big toenail has been very sore and I cannot have the sheets on it night and I've tried to trim it and soak it   Review of Systems  All other systems reviewed and are negative.      Objective:   Physical Exam  Constitutional: She is oriented to person, place, and time.  Cardiovascular: Intact distal pulses.   Musculoskeletal: Normal range of motion.  Neurological: She is oriented to person, place, and time.  Skin: Skin is warm.  Nursing note and vitals reviewed.  neurovascular status intact muscle strength adequate range of motion within normal limits with patient noted to have diminished vibratory of a moderate nature bilateral and diminished sharp Dole tactile sensation. Patient's noted to have an incurvated right hallux medial border that's painful with thickness the hallux nails bilateral and pain in the left ankle history of scar on the lateral side and dorsal surface of the ankle itself.     Assessment:     Ingrown toenail deformity right hallux medial border and traumatized left ankle that is very difficult to understand as to why it hurts    Plan:     H&P condition reviewed with patient and I've recommended correction of the ingrown explaining risk. Patient wants this done and today I infiltrated the right big toe 60 mg Xylocaine Marcaine mixture removed the medial border exposed matrix and applied phenol 3 applications 30 seconds followed by alcohol lavage and sterile dressing. Directions on soaks discussed the ankle unfortunately there is no other further treatments I can think of

## 2015-09-18 ENCOUNTER — Ambulatory Visit (INDEPENDENT_AMBULATORY_CARE_PROVIDER_SITE_OTHER): Payer: Medicare Other | Admitting: Podiatry

## 2015-09-18 ENCOUNTER — Encounter: Payer: Self-pay | Admitting: Podiatry

## 2015-09-18 VITALS — BP 159/89 | HR 76 | Temp 96.8°F | Resp 16

## 2015-09-18 DIAGNOSIS — L03031 Cellulitis of right toe: Secondary | ICD-10-CM

## 2015-09-18 DIAGNOSIS — L03011 Cellulitis of right finger: Secondary | ICD-10-CM

## 2015-09-18 MED ORDER — CEPHALEXIN 500 MG PO CAPS: 500.0000 mg | ORAL_CAPSULE | Freq: Two times a day (BID) | ORAL | Status: DC

## 2015-09-18 NOTE — Progress Notes (Signed)
Subjective:     Patient ID: [name], female   DOB: [birth year], 64 y.o.   MRN: [id number]  HPI patient states I have some redness in my nailbeds but it's localized and I just wanted to make sure that there was no infection   Review of Systems     Objective:   Physical Exam Neurovascular status intact muscle strength adequate with mild redness in the lateral side of the right hallux with crusted tissue but no significant active drainage or no proximal edema erythema noted    Assessment:     Localized paronychia infection right hallux    Plan:     Reviewed the causes of this and considerations for soaks and bandage usage and placed patient on cephalexin 500 mg twice a day for 10 days and instructed to call back if any issues should occur

## 2015-11-13 ENCOUNTER — Ambulatory Visit (INDEPENDENT_AMBULATORY_CARE_PROVIDER_SITE_OTHER): Payer: Medicare Other | Admitting: Podiatry

## 2015-11-13 ENCOUNTER — Ambulatory Visit (INDEPENDENT_AMBULATORY_CARE_PROVIDER_SITE_OTHER): Payer: Medicare Other

## 2015-11-13 ENCOUNTER — Encounter: Payer: Self-pay | Admitting: Podiatry

## 2015-11-13 VITALS — Temp 96.7°F

## 2015-11-13 DIAGNOSIS — L03031 Cellulitis of right toe: Secondary | ICD-10-CM

## 2015-11-13 DIAGNOSIS — M79671 Pain in right foot: Secondary | ICD-10-CM

## 2015-11-13 DIAGNOSIS — L600 Ingrowing nail: Secondary | ICD-10-CM | POA: Diagnosis not present

## 2015-11-13 DIAGNOSIS — L03011 Cellulitis of right finger: Secondary | ICD-10-CM

## 2015-11-13 NOTE — Progress Notes (Signed)
Subjective:     Patient ID: [name], female   DOB: 21-Mar-1951, 65 y.o.   MRN: [id number]  HPI patient states I was doing fine with my right big toenail but then around 10 days ago it started to get red again and started to get sore and I noted some drainage and I'm due to have I surgery tomorrow   Review of Systems     Objective:   Physical Exam Neurovascular status intact muscle strength adequate with patient having localized redness to the medial side of the right hallux with crusted tissue formation but no current proximal edema erythema or drainage noted    Assessment:     Localized paronychia infection right with no indication of systemic infection    Plan:     Precautionary x-ray reviewed with patient and due to her eye surgery tomorrow and await a week and then open the area up and culture. I also advised on soaks and reappoint earlier if any issues should occur

## 2015-11-20 ENCOUNTER — Ambulatory Visit (INDEPENDENT_AMBULATORY_CARE_PROVIDER_SITE_OTHER): Payer: Medicare Other | Admitting: Podiatry

## 2015-11-20 ENCOUNTER — Encounter: Payer: Self-pay | Admitting: Podiatry

## 2015-11-20 DIAGNOSIS — L03011 Cellulitis of right finger: Secondary | ICD-10-CM

## 2015-11-20 DIAGNOSIS — L03031 Cellulitis of right toe: Secondary | ICD-10-CM

## 2015-11-20 NOTE — Progress Notes (Signed)
Subjective:     Patient ID: [name], female   DOB: 1951-09-22, 65 y.o.   MRN: [id number]  HPI patient states I'm still having a lot of pain in my big toe right and my eye surgery when okay and he said it was okay to have it fixed   Review of Systems     Objective:   Physical Exam Neurovascular status was found to be intact with patient having redness on the medial side of the right big toe and slightly on the lateral side with a loose nail which is unhealthy and damaged at the current time. There was no current proximal edema erythema or drainage noted    Assessment:     What appears to be localized paronychia infection right big toe with possibility for involvement of both medial lateral and central side    Plan:     Reviewed condition at great length and did discuss is no guarantee this will solve it and possibilities for other treatments may be necessary at one point in the future. I infiltrated the right hallux 60 mg Xylocaine Marcaine mixture and utilizing sterile technique I removed the entire nail I then did abscess resection both medial and lateral side and did do a culture of the medial side. I flushed the area entirely and applied sterile dressings and since it's localized I am not putting her on antibiotics but I gave strict instructions of any further redness or problems should occur she is to let us know immediately

## 2015-11-20 NOTE — Patient Instructions (Signed)

## 2015-11-20 NOTE — Addendum Note (Signed)
Addended by: Harriett Sine D on: 11/20/2015 01:11 PM   Modules accepted: Orders

## 2015-11-23 LAB — WOUND CULTURE
GRAM STAIN: NONE SEEN
Gram Stain: NONE SEEN
Gram Stain: NONE SEEN

## 2015-11-25 ENCOUNTER — Telehealth: Payer: Self-pay | Admitting: *Deleted

## 2015-11-25 NOTE — Telephone Encounter (Signed)
Called patient at [phone] (Home #) to check to see how they were doing from their Paronychia procedure that was performed on Wednesday, November 20, 2015. Pt stated, "Toe is hurting and soaking toe is not helping". Pt is taking 500 mg of ibuprofen. I told patient that they could take up to 600 mg of ibuprofen twice a day to help with the pain. Instructed the patient to call us back if they have any questions or concerns.

## 2015-12-04 ENCOUNTER — Ambulatory Visit (INDEPENDENT_AMBULATORY_CARE_PROVIDER_SITE_OTHER): Payer: Medicare Other

## 2015-12-04 ENCOUNTER — Encounter: Payer: Self-pay | Admitting: Podiatry

## 2015-12-04 ENCOUNTER — Ambulatory Visit (INDEPENDENT_AMBULATORY_CARE_PROVIDER_SITE_OTHER): Payer: Medicare Other | Admitting: Podiatry

## 2015-12-04 VITALS — BP 178/97 | HR 79 | Temp 95.9°F | Resp 16

## 2015-12-04 DIAGNOSIS — L03031 Cellulitis of right toe: Secondary | ICD-10-CM

## 2015-12-04 DIAGNOSIS — L03011 Cellulitis of right finger: Secondary | ICD-10-CM

## 2015-12-04 MED ORDER — AMOXICILLIN-POT CLAVULANATE 875-125 MG PO TABS: 1.0000 | ORAL_TABLET | Freq: Two times a day (BID) | ORAL | Status: DC

## 2015-12-04 MED ORDER — FLUCONAZOLE 150 MG PO TABS: 150.0000 mg | ORAL_TABLET | Freq: Once | ORAL | Status: DC

## 2015-12-04 NOTE — Progress Notes (Signed)
Subjective:     Patient ID: [name], female   DOB: 1951/01/09, 65 y.o.   MRN: [id number]  HPI seems to be a little bit better but still get sore and red but I did not noticed any drainage   Review of Systems     Objective:   Physical Exam Neurovascular status unchanged with continued irritation around the right hallux with the nailbed is exposed and the nail was removed with no odor no proximal edema erythema currently but pain    Assessment:     The cultures did come back with abundant staph infection but at this time it appears to be localized and does not appear to have any other form spread    Plan:     As a precautionary measure I did re-x-ray and reviewed and then went ahead and placed on Augmentin 875 twice a day and gave her a Diflucan for fungal infection. Patient will be seen back for [place] to recheck and is given strict instructions if any increased redness drainage or other issues should occur to let us know immediately  X-ray report was negative for any signs of osteomyelitic change currently right

## 2015-12-05 ENCOUNTER — Ambulatory Visit: Payer: Medicare Other | Admitting: Podiatry

## 2016-01-15 ENCOUNTER — Encounter: Payer: Self-pay | Admitting: Neurology

## 2016-01-15 ENCOUNTER — Ambulatory Visit (INDEPENDENT_AMBULATORY_CARE_PROVIDER_SITE_OTHER): Payer: Medicare Other | Admitting: Neurology

## 2016-01-15 VITALS — BP 185/112 | HR 103 | Ht 62.0 in | Wt 174.8 lb

## 2016-01-15 DIAGNOSIS — R1900 Intra-abdominal and pelvic swelling, mass and lump, unspecified site: Secondary | ICD-10-CM | POA: Diagnosis not present

## 2016-01-15 DIAGNOSIS — H905 Unspecified sensorineural hearing loss: Secondary | ICD-10-CM

## 2016-01-15 DIAGNOSIS — R51 Headache: Secondary | ICD-10-CM | POA: Diagnosis not present

## 2016-01-15 DIAGNOSIS — R1310 Dysphagia, unspecified: Secondary | ICD-10-CM | POA: Diagnosis not present

## 2016-01-15 DIAGNOSIS — H05113 Granuloma of bilateral orbits: Secondary | ICD-10-CM

## 2016-01-15 DIAGNOSIS — R110 Nausea: Secondary | ICD-10-CM | POA: Diagnosis not present

## 2016-01-15 DIAGNOSIS — R410 Disorientation, unspecified: Secondary | ICD-10-CM

## 2016-01-15 DIAGNOSIS — F05 Delirium due to known physiological condition: Secondary | ICD-10-CM

## 2016-01-15 DIAGNOSIS — H4720 Unspecified optic atrophy: Secondary | ICD-10-CM

## 2016-01-15 DIAGNOSIS — G932 Benign intracranial hypertension: Secondary | ICD-10-CM | POA: Diagnosis not present

## 2016-01-15 DIAGNOSIS — R1030 Lower abdominal pain, unspecified: Secondary | ICD-10-CM

## 2016-01-15 DIAGNOSIS — H539 Unspecified visual disturbance: Secondary | ICD-10-CM | POA: Diagnosis not present

## 2016-01-15 DIAGNOSIS — W19XXXA Unspecified fall, initial encounter: Secondary | ICD-10-CM

## 2016-01-15 DIAGNOSIS — R198 Other specified symptoms and signs involving the digestive system and abdomen: Secondary | ICD-10-CM

## 2016-01-15 DIAGNOSIS — R188 Other ascites: Secondary | ICD-10-CM | POA: Diagnosis not present

## 2016-01-15 DIAGNOSIS — R519 Headache, unspecified: Secondary | ICD-10-CM

## 2016-01-15 DIAGNOSIS — H9190 Unspecified hearing loss, unspecified ear: Secondary | ICD-10-CM

## 2016-01-15 DIAGNOSIS — R2689 Other abnormalities of gait and mobility: Secondary | ICD-10-CM

## 2016-01-15 DIAGNOSIS — H53453 Other localized visual field defect, bilateral: Secondary | ICD-10-CM

## 2016-01-15 DIAGNOSIS — T8509XA Other mechanical complication of ventricular intracranial (communicating) shunt, initial encounter: Secondary | ICD-10-CM

## 2016-01-15 MED ORDER — ACETAZOLAMIDE 250 MG PO TABS: 250.0000 mg | ORAL_TABLET | Freq: Three times a day (TID) | ORAL | Status: DC

## 2016-01-15 NOTE — Patient Instructions (Addendum)
Remember to drink plenty of fluid, eat healthy meals and do not skip any meals. Try to eat protein with a every meal and eat a healthy snack such as fruit or nuts in between meals. Try to keep a regular sleep-wake schedule and try to exercise daily, particularly in the form of walking, 20-30 minutes a day, if you can.   As far as your medications are concerned, I would like to suggest: Diamox 250mg  start with twice daily then go to three times daily.  As far as diagnostic testing: MRI brain w/wo contrast, MRI of the orbits w/wo CM, MRi of the abdomen  Our phone number is [phone]. We also have an after hours call service for urgent matters and there is a physician on-call for urgent questions. For any emergencies you know to call 911 or go to the nearest emergency room

## 2016-01-15 NOTE — Progress Notes (Signed)
[facility]    Provider:  Dr Jaynee Eagles Referring Provider: [name], MD Primary Care Physician:  Mathews Argyle, MD  CC: IIH and headache  HPI:  [name] is a 65 y.o. female here as a referral from Dr. Felipa Eth for [place]. PMHx IIH s/p peritoneal shunt placement in 1997 and revision in 2007 due to abdominal scarring, DM, Cataracts. She has a shunt placed that extends into the abdomen for csf drainage. She was following with [facility] and Lilia Argue and now follows with [facility] for ophthalmology. She has headaches that start on the right side by the eye, then it affects the temple and then the left side. Feels like pressure. She has to lay down and be in bed for 45 minutes and it feels better. Better when she lays down. She gets the headaches any time of the day. Yesterday she was fine in the morning and started in the afternoon. When it is really bad the shunt on the right side of her abdomen starts burning and feels like it is bubbling up inside her abdomen. She has abdominal pain and increased fluid feeling in her abdomen. She has headaches. 8-9/10 on average. They last all day long or even a few days straight. Even before the shunt laying down always made her headaches better. She wonders if the shunt is working, these headaches are similar to previous when she had IIH. No light sensitivity, no sound sensitivity, a little nausea but no vomiting. Her right ear turns red with the headaches are severe. Vision is not good. The right eye is cloudy and possibly developing glaucoma. Vision doesn't worsen with the headaches however her vision is generally poor and worsening peripheral vision. Her shunt is not adjustable, Initially placed in 1999 and last revision in 2007. She has gained weight or possibly fluid in the abdomen. She has fluid in the abdomen and her weight can fluctuate up to 220. She also has daily weight fluctuations.No history of nephrolithiasis  when she was on the diamox or Topamax. The headaches started worsening 6 months ago and slowly progressive. Last MRi of the brain was in 2015 at [facility]. This feels like her headaches she had in the past with IIH. Worsening, getting more frequent, she has disorientation, stumbling, imbalance, difficulty swallowing cutting up her food up in baby portions, confusional episodes, speech slurring yesterday, word-finding difficulty, swelling in the abdomen. She has "water in the ocean"  sounds in her ears, ringing in the ears.   Reviewed notes, labs and imaging from outside physicians, which showed: Reviewed notes from [facility]. Dr. Micheline Chapman 07/2015: [name] is a 65 y.o. old female with constricted visual fields, history of idiopathic intracranial hypertension, ptosis and anisocoria. 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 Iopidine testing did not show evidence of a Horner syndrome, and an electroretinogram was unremarkable in the past. 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 This suggests that although the patient may indeed have had idiopathic intracranial hypertension past, does not appear as though there was long-standing ocular damage, at least not enough to account patient's reports of peripheral visual field loss. Marland Kitchen IIH (idiopathic intracranial hypertension) as noted by history, but opening pressure reported by the patient is normal, suggesting that the patient has a working shunt, or other processes are at work to alleviate  intracranial hypertension. Drusen (degenerative) of retina, bilateral stable finding, and does not seem to be sufficient to explain the patient's peripheral visual field loss.  Ptosis, right eyelid patient states that this is been present for several months, but along with anisocoria Iopidine testing w/o  horner, Anisocoria as above    Lumbar puncture in December 2015 opening pressure 184 mm H2O in the left lateral decubitus position. Unremarkable CSF fluid.  I also reviewed notes from [facility].  Patient had serial lumbar punctures in 1999.  MRA of the head in June 1999 were normal.  MRI of the brain with and without contrast in May 1999 was abnormal with areas of increased signal intensity in the bilateral cerebral hemispheres of the gray-white junction best seen on FLAIR images consistent with a vasculitic process. Atrophy out of portion to age.  EEG in May 1999 was normal sleep and awake.  Patient was diagnosed with pseudotumor status post shunt July 99 with revision in August 1999. LP notes in July 1999 with opening pressure of 34 with unremarkable CSF. MS panel, Lyme, VDRL, fungal culture, AFB, cryptococcus antigen, bacterial antigens in CSF was negative in June 1999 with opening pressure 39. It appears patient had had serial LPs prior to this however in opening pressure was higher. patient reported headaches almost daily symptoms usually get better laying down with the vision and hearing changes some nausea vomiting with the headache. Notes are handwritten and difficult to read. Appears she was tried on Depakote, Medrol Dosepak, desipramine, Demerol, Phenergan, Diamox, Vicodin, Ativan for headaches. Subsequent notes state blurry vision, tinnitus, stiff neck, vertigo, tremor, lightheadedness, fatigue, and headaches.    Review of Systems: Patient complains of symptoms per HPI as well as the following symptoms: Fatigue, blurred vision, eye pain, feeling hot, feeling cold, flushing, ringing in ears, allergies, headache, numbness, slurred speech, difficulty swallowing, dizziness, tremor, sleepiness, restless legs. Pertinent negatives per HPI. All others negative.   Social History   Social History  . Marital Status: Married    Spouse Name: Simona Huh  . Number of  Children: 2  . Years of Education: 16   Occupational History  . Not on file.   Social History Main Topics  . Smoking status: Never Smoker   . Smokeless tobacco: Never Used  . Alcohol Use: No  . Drug Use: No  . Sexual Activity: Not on file   Other Topics Concern  . Not on file   Social History Narrative   Lives at home with husband and daughter   Caffeine use: coke daily       Family History  Problem Relation Age of Onset  . Heart disease Father   . Migraines Neg Hx     Past Medical History  Diagnosis Date  . GERD (gastroesophageal reflux disease)   . Wears dentures     full  . Pseudotumor cerebri     has lumbar peritoneal shunt  . PONV (postoperative nausea and vomiting)   . 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 Headache(784.0)     due to pseudotumor; daily headache  . Anemia     takes iron supplement  . Hypertension     under control with meds., has been on med. x 5 yr.  . Diabetes mellitus     IDDM  . Anesthesia complication     disorientation due to pseudotumor  . 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 Peripheral neuropathy (  [place])   . Hypercholesteremia     unable to tolerate statins  . Family history of anesthesia complication 13 yrs ago    brother stopped breathing for a minute or two    Past Surgical History  Procedure Laterality Date  . Appendectomy      as a child  . Bladder suspension    . Lumbar peritoneal shunt      x 2  . Total abdominal hysterectomy w/ bilateral salpingoophorectomy  1994    age 19s  . Ovarian cyst removal      age 56  . Shunt revision  2007  . Breast lumpectomy w/ needle localization Left 05/22/2011  . Esophageal dilation  06/23/2006; 08/05/2004  . Ankle arthroscopy Left 05/18/2013    Procedure: LEFT ANKLE ARTHROSCOPY WITH DEBRIDEMENT,  SUBTALAR OPEN DEBRIDEMENT ;  Surgeon: Wylene Simmer, MD;  Location: [place];  Service: Orthopedics;  Laterality: Left;  . Esophagogastroduodenoscopy  N/A 07/04/2013    Procedure: ESOPHAGOGASTRODUODENOSCOPY (EGD);  Surgeon: Garlan Fair, MD;  Location: [facility];  Service: Endoscopy;  Laterality: N/A;  . Balloon dilation N/A 07/04/2013    Procedure: BALLOON DILATION;  Surgeon: Garlan Fair, MD;  Location: [facility];  Service: Endoscopy;  Laterality: N/A;  . Wrist surgery Left 2014     [name]  . Colonoscopy with propofol N/A 05/14/2014    Procedure: COLONOSCOPY WITH PROPOFOL;  Surgeon: Garlan Fair, MD;  Location: [facility];  Service: Endoscopy;  Laterality: N/A;  . Esophagogastroduodenoscopy (egd) with propofol N/A 05/14/2014    Procedure: ESOPHAGOGASTRODUODENOSCOPY (EGD) WITH PROPOFOL;  Surgeon: Garlan Fair, MD;  Location: [facility];  Service: Endoscopy;  Laterality: N/A;    Current Outpatient Prescriptions  Medication Sig Dispense Refill  . acetaZOLAMIDE (DIAMOX) 250 MG tablet Take 1 tablet (250 mg total) by mouth 3 (three) times daily. 90 tablet 12  . amoxicillin-clavulanate (AUGMENTIN) 875-125 MG tablet Take 1 tablet by mouth 2 (two) times daily. 20 tablet 0  . cloNIDine (CATAPRES) 0.3 MG tablet Take 0.3 mg by mouth 2 (two) times daily.     . diclofenac sodium (VOLTAREN) 1 % GEL Apply 2 g topically 4 (four) times daily as needed (pain).     Marland Kitchen esomeprazole (NEXIUM) 40 MG capsule Take 40 mg by mouth daily as needed (acid reflux). Acid reflux    . estropipate (OGEN) 1.5 MG tablet Take 0.75 mg by mouth daily.     . fluconazole (DIFLUCAN) 150 MG tablet Take 1 tablet (150 mg total) by mouth once. 1 tablet 0  . glipiZIDE (GLUCOTROL) 10 MG tablet Take 10 mg by mouth 2 (two) times daily before a meal.     . insulin glargine (LANTUS) 100 UNIT/ML injection Inject 18 Units into the skin at bedtime.     . metFORMIN (GLUMETZA) 500 MG (MOD) 24 hr tablet Take 1,000 mg by mouth 2 (two) times daily with a meal.    . metoprolol (LOPRESSOR) 50 MG tablet Take 50 mg by mouth every evening.    . metoprolol (TOPROL-XL) 100 MG 24 hr  tablet Take 100 mg by mouth daily.     Marland Kitchen PRESCRIPTION MEDICATION Take 20 mg by mouth 3 (three) times daily.    Marland Kitchen telmisartan (MICARDIS) 80 MG tablet Take 80 mg by mouth every evening.      No current facility-administered medications for this visit.    Allergies as of 01/15/2016 - Review Complete 12/04/2015  Allergen Reaction Noted  . Penicillins Anaphylaxis 05/05/2011  .  Carbamazepine Hives 05/05/2011  . Statins Other (See Comments) 05/05/2011  . Tricyclic antidepressants Other (See Comments) 05/05/2011    Vitals: BP 185/112 mmHg  Pulse 103  Ht 5\' 2"  (1.575 m)  Wt 174 lb 12.8 oz (79.289 kg)  BMI 31.96 kg/m2 Last Weight:  Wt Readings from Last 1 Encounters:  01/15/16 174 lb 12.8 oz (79.289 kg)   Last Height:   Ht Readings from Last 1 Encounters:  01/15/16 5\' 2"  (1.575 m)   Physical exam: Exam: Gen: NAD, conversant, well nourised, obese, well groomed                     CV: RRR, no MRG. No Carotid Bruits. No peripheral edema, warm, nontender Eyes: Conjunctivae clear without exudates or hemorrhage  Neuro: Detailed Neurologic Exam  Speech:    Speech is normal; fluent and spontaneous with normal comprehension.  Cognition:    The patient is oriented to person, place, and time;     recent and remote memory intact;     language fluent;     normal attention, concentration,     fund of knowledge Cranial Nerves:    The pupils are reactive, Anisocoria OU, The fundi are pale without edema. Visual fields are full to finger confrontation but more difficultyon the left. Extraocular movements are intact, impaired smooth pursuit. Trigeminal sensation is intact and the muscles of mastication are normal. OU ptosis otherwise the face is symmetric. The palate elevates in the midline. Hearing intact. Voice is normal. Shoulder shrug is normal. The tongue has normal motion without fasciculations.   Coordination:    Normal finger to nose and heel to shin. Normal rapid alternating movements.     Gait:    Boot on the right leg. Slow cautious gait, narrow stride, not shuffling, imbalance  Motor Observation:    Postural high frequency, low amplitude tremor with kinetic components. Tone:    Normal muscle tone.    Posture:    Slightly stooped.     Strength: difficulty getting up out of the seat without using hands. 3+/5 prox LE weakness.     Otherwise strength is V/V in the upper and lower limbs.      Sensation: intact to LT     Reflex Exam:  DTR's: Absent Ajs, Otherwise brisk reflexes  Toes:    The toes are downgoing bilaterally.   Clonus:    Clonus is absent.       Assessment/Plan:  Kaybree Loye is a 65 y.o. old female with PMHx IIH s/p shunt placement in 1999 and revision in 2007 due to abdominal scarring, DM, HLD. Reports worsening headaches, more frequent and severe, increased abdominal girth and fluid with fluctuating weight.   - Generalized constriction of visual field, follows with Dr. Katy Fitch - Optic atrophy but the retinal nerve fiber layer analysis was completely normal so it does not appear as though there was long-standing ocular damage, at least not enough to account patient's reports of peripheral visual field loss  -IIH (idiopathic intracranial hypertension) , worsening headaches and increased abdominal fluid, needs MRI of the brain and orbits as well as MRI of the abdomen to assess for shunt blockage as previous. Start Diamox 250mg  bid. -Ptosis, right eyelid along with anisocoria Iopidine testing w/o horner -labs today  Sarina Ill, MD  [facility] [address] [place], [place] [address]  Phone [phone] Fax [phone]

## 2016-01-16 ENCOUNTER — Telehealth: Payer: Self-pay | Admitting: *Deleted

## 2016-01-16 LAB — BASIC METABOLIC PANEL
BUN/Creatinine Ratio: 13 (ref 11–26)
BUN: 14 mg/dL (ref 8–27)
CALCIUM: 10.2 mg/dL (ref 8.7–10.3)
CO2: 23 mmol/L (ref 18–29)
CREATININE: 1.06 mg/dL — AB (ref 0.57–1.00)
Chloride: 94 mmol/L — ABNORMAL LOW (ref 96–106)
GFR calc Af Amer: 64 mL/min/{1.73_m2} (ref >59)
GFR calc non Af Amer: 56 mL/min/{1.73_m2} — ABNORMAL LOW (ref >59)
GLUCOSE: 327 mg/dL — AB (ref 65–99)
Potassium: 5.4 mmol/L — ABNORMAL HIGH (ref 3.5–5.2)
SODIUM: 137 mmol/L (ref 134–144)

## 2016-01-16 LAB — SEDIMENTATION RATE: SED RATE: 5 mm/h (ref 0–40)

## 2016-01-16 LAB — C-REACTIVE PROTEIN: CRP: 10.1 mg/L — ABNORMAL HIGH (ref 0.0–4.9)

## 2016-01-16 NOTE — Telephone Encounter (Signed)
Spoke to pt about lab results per Dr Jaynee Eagles note. She verbalized understanding. Denies eating large meal prior to appt yesterday. She will contact PCP.

## 2016-01-16 NOTE — Telephone Encounter (Signed)
-----   Message from Melvenia Beam, MD sent at 01/16/2016 12:12 PM EDT ----- Terrence Dupont, patient's glucose was very elevated yesterday at 327. Has she been watching her glucose? She needs to call Dr. Felipa Eth and have that evaluated soon. That is very high. Had she just eaten a big meal?

## 2016-01-18 ENCOUNTER — Encounter: Payer: Self-pay | Admitting: Neurology

## 2016-01-18 DIAGNOSIS — F05 Delirium due to known physiological condition: Secondary | ICD-10-CM

## 2016-01-18 DIAGNOSIS — H539 Unspecified visual disturbance: Secondary | ICD-10-CM | POA: Insufficient documentation

## 2016-01-18 DIAGNOSIS — H53459 Other localized visual field defect, unspecified eye: Secondary | ICD-10-CM | POA: Insufficient documentation

## 2016-01-18 DIAGNOSIS — T8509XA Other mechanical complication of ventricular intracranial (communicating) shunt, initial encounter: Secondary | ICD-10-CM | POA: Insufficient documentation

## 2016-01-18 DIAGNOSIS — R296 Repeated falls: Secondary | ICD-10-CM | POA: Insufficient documentation

## 2016-01-18 DIAGNOSIS — R519 Headache, unspecified: Secondary | ICD-10-CM | POA: Insufficient documentation

## 2016-01-18 DIAGNOSIS — W19XXXA Unspecified fall, initial encounter: Secondary | ICD-10-CM | POA: Insufficient documentation

## 2016-01-18 DIAGNOSIS — R110 Nausea: Secondary | ICD-10-CM | POA: Insufficient documentation

## 2016-01-18 DIAGNOSIS — R188 Other ascites: Secondary | ICD-10-CM | POA: Insufficient documentation

## 2016-01-18 DIAGNOSIS — R2689 Other abnormalities of gait and mobility: Secondary | ICD-10-CM | POA: Insufficient documentation

## 2016-01-18 DIAGNOSIS — H4720 Unspecified optic atrophy: Secondary | ICD-10-CM | POA: Insufficient documentation

## 2016-01-18 DIAGNOSIS — H9190 Unspecified hearing loss, unspecified ear: Secondary | ICD-10-CM | POA: Insufficient documentation

## 2016-01-18 DIAGNOSIS — R51 Headache: Secondary | ICD-10-CM

## 2016-01-18 DIAGNOSIS — R410 Disorientation, unspecified: Secondary | ICD-10-CM | POA: Insufficient documentation

## 2016-01-18 DIAGNOSIS — G932 Benign intracranial hypertension: Secondary | ICD-10-CM | POA: Insufficient documentation

## 2016-01-18 DIAGNOSIS — R1310 Dysphagia, unspecified: Secondary | ICD-10-CM | POA: Insufficient documentation

## 2016-01-18 DIAGNOSIS — R198 Other specified symptoms and signs involving the digestive system and abdomen: Secondary | ICD-10-CM | POA: Insufficient documentation

## 2016-01-22 ENCOUNTER — Other Ambulatory Visit: Payer: Self-pay | Admitting: *Deleted

## 2016-01-22 DIAGNOSIS — R198 Other specified symptoms and signs involving the digestive system and abdomen: Secondary | ICD-10-CM

## 2016-01-22 DIAGNOSIS — T8509XA Other mechanical complication of ventricular intracranial (communicating) shunt, initial encounter: Secondary | ICD-10-CM

## 2016-01-22 DIAGNOSIS — R110 Nausea: Secondary | ICD-10-CM

## 2016-01-22 DIAGNOSIS — R188 Other ascites: Secondary | ICD-10-CM

## 2016-01-30 ENCOUNTER — Other Ambulatory Visit: Payer: Medicare Other

## 2016-01-30 ENCOUNTER — Ambulatory Visit
Admission: RE | Admit: 2016-01-30 | Discharge: 2016-01-30 | Disposition: A | Discharge status: Home / Self Care | Payer: Medicare Other | Source: Ambulatory Visit | Attending: Neurology | Admitting: Neurology

## 2016-01-30 DIAGNOSIS — H53453 Other localized visual field defect, bilateral: Secondary | ICD-10-CM

## 2016-01-30 DIAGNOSIS — T8509XA Other mechanical complication of ventricular intracranial (communicating) shunt, initial encounter: Secondary | ICD-10-CM

## 2016-01-30 DIAGNOSIS — R2689 Other abnormalities of gait and mobility: Secondary | ICD-10-CM

## 2016-01-30 DIAGNOSIS — F05 Delirium due to known physiological condition: Secondary | ICD-10-CM

## 2016-01-30 DIAGNOSIS — R410 Disorientation, unspecified: Secondary | ICD-10-CM

## 2016-01-30 DIAGNOSIS — H539 Unspecified visual disturbance: Secondary | ICD-10-CM

## 2016-01-30 DIAGNOSIS — W19XXXA Unspecified fall, initial encounter: Secondary | ICD-10-CM

## 2016-01-30 DIAGNOSIS — G932 Benign intracranial hypertension: Secondary | ICD-10-CM

## 2016-01-30 DIAGNOSIS — H4720 Unspecified optic atrophy: Secondary | ICD-10-CM

## 2016-01-30 DIAGNOSIS — R519 Headache, unspecified: Secondary | ICD-10-CM

## 2016-01-30 DIAGNOSIS — R51 Headache: Principal | ICD-10-CM

## 2016-01-30 DIAGNOSIS — H05113 Granuloma of bilateral orbits: Secondary | ICD-10-CM

## 2016-01-30 DIAGNOSIS — H9190 Unspecified hearing loss, unspecified ear: Secondary | ICD-10-CM

## 2016-01-30 DIAGNOSIS — R1310 Dysphagia, unspecified: Secondary | ICD-10-CM

## 2016-01-30 MED ORDER — GADOBENATE DIMEGLUMINE 529 MG/ML IV SOLN
15.0000 mL | Freq: Once | INTRAVENOUS | Status: AC | PRN
  Administered 2016-01-30: 15 mL via INTRAVENOUS

## 2016-02-03 ENCOUNTER — Other Ambulatory Visit: Payer: Medicare Other

## 2016-02-03 ENCOUNTER — Telehealth: Payer: Self-pay | Admitting: Neurology

## 2016-02-03 ENCOUNTER — Inpatient Hospital Stay: Admission: RE | Admit: 2016-02-03 | Payer: Medicare Other | Source: Ambulatory Visit

## 2016-02-03 NOTE — Telephone Encounter (Signed)
Patient called to advise, MRI completed, we should receive report today or tomorrow, MRI of abdomen was to be done today however, was scheduled in wrong room, they could not do today, was rescheduled to 02/10/16.

## 2016-02-04 ENCOUNTER — Encounter: Payer: Self-pay | Admitting: Neurology

## 2016-02-04 ENCOUNTER — Encounter: Payer: Self-pay | Admitting: Geriatric Medicine

## 2016-02-06 NOTE — Telephone Encounter (Signed)
I spoke to patient last night regarding MRIs thanks.

## 2016-02-07 ENCOUNTER — Other Ambulatory Visit: Payer: Self-pay

## 2016-02-07 ENCOUNTER — Other Ambulatory Visit: Payer: Medicare Other

## 2016-02-10 ENCOUNTER — Ambulatory Visit
Admission: RE | Admit: 2016-02-10 | Discharge: 2016-02-10 | Disposition: A | Discharge status: Home / Self Care | Payer: Medicare Other | Source: Ambulatory Visit | Attending: Neurology | Admitting: Neurology

## 2016-02-10 ENCOUNTER — Telehealth: Payer: Self-pay | Admitting: Neurology

## 2016-02-10 DIAGNOSIS — R110 Nausea: Secondary | ICD-10-CM

## 2016-02-10 DIAGNOSIS — R198 Other specified symptoms and signs involving the digestive system and abdomen: Secondary | ICD-10-CM

## 2016-02-10 DIAGNOSIS — T8509XA Other mechanical complication of ventricular intracranial (communicating) shunt, initial encounter: Secondary | ICD-10-CM

## 2016-02-10 DIAGNOSIS — R188 Other ascites: Secondary | ICD-10-CM

## 2016-02-10 MED ORDER — GADOBENATE DIMEGLUMINE 529 MG/ML IV SOLN
15.0000 mL | Freq: Once | INTRAVENOUS | Status: AC | PRN
  Administered 2016-02-10: 15 mL via INTRAVENOUS

## 2016-02-10 NOTE — Telephone Encounter (Signed)
I spoke to Dr. Kerby Moors who informed me that the patient does not have a VP shunt in the head but only has a small remnant of the tube in the patient's belly.Marland Kitchen

## 2016-02-10 NOTE — Telephone Encounter (Signed)
[facility], called and needs the physician to call as soon as possible about pt results.  Call [phone] Dr. Clovis Riley is expecting the call.

## 2016-02-11 NOTE — Telephone Encounter (Signed)
Patient told me that she thinks she has a lumboperitoneal shunt. Notes from [facility] call it a lumbar drain.  Are you saying that the shunt is not longer in place and only a remnant remains? Thanks

## 2016-02-12 ENCOUNTER — Telehealth: Payer: Self-pay | Admitting: Neurology

## 2016-02-12 DIAGNOSIS — T85618A Breakdown (mechanical) of other specified internal prosthetic devices, implants and grafts, initial encounter: Secondary | ICD-10-CM

## 2016-02-12 NOTE — Telephone Encounter (Signed)
I spoke to dr stroud. He is going to addend the report. Catheter starts at t9 and comes out at l2/L3 and goes into the fat overlying the buttocks where the the reservoir is then proceeds to the right lobe of the liver liver. No fluid collection or soft tissue mass. He also recommends CT abdomen pelvis w/o [name], would you please call patient and say that the MRi showed the following:   Hepatobiliary: Diffuse hepatic steatosis. No suspicious enhancing liver abnormality identified.  She should follow up with Dr. Felipa Eth about he liver, this is very common however so I don't want her to be worried. She should just go over it with him. Diffuse hepatic steatosis, also known as fatty liver, is a common imaging finding and is associated with diabetes, obesity and dyslipidemia and other causes.   Otherwise: The gallbladder, pancreas, adrenal glands,stomach/bowel,lymph nodes,   appears within normal limits.  They did not see any fluid (ascites) in the abdomen or pelvis. The catheter is not associated with any fluid collections or soft tissue masses. They can see the catheter as it courses from the spine to the abdomen/pelvis and it appears intact.   Neuroradiology is also recommending additionally a CT of the pelvis and abdomen without contrast to further evaluate the shunt. This will give [place] additional information on the shunt.  Would she be willing to undergo a CT abdomen and pelvis?

## 2016-02-12 NOTE — Telephone Encounter (Signed)
Never mind Dr. Lenell Antu, thanks for the info. I spoke to Dr. Clovis Riley. I am all set on this, no need to call me or email me back. Thanks again.

## 2016-02-12 NOTE — Telephone Encounter (Signed)
Called and spoke to pt. Relayed message below per Dr Jaynee Eagles note. Went over results. She would like to have CT pelvis/abdomen. Advised Dr Jaynee Eagles out of office this week and will be back next week. We will place orders and she should get a call to schedule. She states "I know something is wrong. I had a grapefruit size mass yesterday which is what happened previously". Told her if she does not hear back to schedule within the next week, call me so we can f/u on it. She verbalized understanding.   Placed orders for CT pelvis/abdomen wo contrast per Dr Jaynee Eagles request.

## 2016-02-19 ENCOUNTER — Other Ambulatory Visit: Payer: Self-pay

## 2016-02-19 DIAGNOSIS — Z1231 Encounter for screening mammogram for malignant neoplasm of breast: Secondary | ICD-10-CM

## 2016-02-21 ENCOUNTER — Ambulatory Visit
Admission: RE | Admit: 2016-02-21 | Discharge: 2016-02-21 | Disposition: A | Discharge status: Home / Self Care | Payer: Medicare Other | Source: Ambulatory Visit

## 2016-02-21 DIAGNOSIS — Z1231 Encounter for screening mammogram for malignant neoplasm of breast: Secondary | ICD-10-CM

## 2016-02-26 ENCOUNTER — Other Ambulatory Visit: Payer: Medicare Other

## 2016-02-26 ENCOUNTER — Ambulatory Visit
Admission: RE | Admit: 2016-02-26 | Discharge: 2016-02-26 | Disposition: A | Discharge status: Home / Self Care | Payer: Medicare Other | Source: Ambulatory Visit | Attending: Neurology | Admitting: Neurology

## 2016-02-26 DIAGNOSIS — T85618A Breakdown (mechanical) of other specified internal prosthetic devices, implants and grafts, initial encounter: Secondary | ICD-10-CM

## 2016-02-27 ENCOUNTER — Telehealth: Payer: Self-pay | Admitting: *Deleted

## 2016-02-27 NOTE — Telephone Encounter (Signed)
Called and spoke to pt. Relayed results per Dr Jaynee Eagles note. She declines both options at this time. She states that 2 years ago, she did a LP and pressure normal. She was sick for 2 weeks with a massive headache, possible stroke. She does not want to have to go through this again. She stated her and her husband are under the impression that the shunt is working fine but it is blocked/surrounded by scar tissue. She states she thinks it is not flowing as consistently as it should be. She states the only way to test is something she went to [facility] for. She states they went into the reservoir and removed 6 vials of spinal fluid and tested this. They then injected dye back into the reservoir to see if it was flowing correctly. Advised I will relay message to Dr Jaynee Eagles and will have her call. She verbalized understanding.

## 2016-02-27 NOTE — Telephone Encounter (Signed)
-----   Message from Melvenia Beam, MD sent at 02/27/2016  5:23 PM EDT ----- CT of the abdomen did not show any abnormality. I will call her to discuss. One option is to remove it and see if the pseudotumor returns. Often it does not. We can also order a lumbar puncture and see if the shunt is working, get her opening pressure. Some things for her to discuss until I can talk to her thanks.

## 2016-02-27 NOTE — Telephone Encounter (Signed)
Faxed all imaging results done so far by Dr Ahern/lab results/office note to Dr Felipa Eth (PCP) per pt request. Fax:  [phone]. Received fax confirmation.

## 2016-03-02 NOTE — Telephone Encounter (Signed)
Thanks, I will call patient

## 2016-03-04 ENCOUNTER — Other Ambulatory Visit: Payer: Medicare Other

## 2016-04-27 ENCOUNTER — Ambulatory Visit
Admission: RE | Admit: 2016-04-27 | Discharge: 2016-04-27 | Disposition: A | Discharge status: Home / Self Care | Payer: Medicare Other | Source: Ambulatory Visit | Attending: Nurse Practitioner | Admitting: Nurse Practitioner

## 2016-04-27 ENCOUNTER — Other Ambulatory Visit: Payer: Self-pay | Admitting: Nurse Practitioner

## 2016-04-27 DIAGNOSIS — J069 Acute upper respiratory infection, unspecified: Secondary | ICD-10-CM

## 2016-06-18 ENCOUNTER — Other Ambulatory Visit: Payer: Self-pay | Admitting: Nurse Practitioner

## 2016-06-18 ENCOUNTER — Ambulatory Visit
Admission: RE | Admit: 2016-06-18 | Discharge: 2016-06-18 | Disposition: A | Discharge status: Home / Self Care | Payer: Medicare Other | Source: Ambulatory Visit | Attending: Nurse Practitioner | Admitting: Nurse Practitioner

## 2016-06-18 DIAGNOSIS — R0609 Other forms of dyspnea: Principal | ICD-10-CM

## 2016-06-23 ENCOUNTER — Other Ambulatory Visit: Payer: Self-pay | Admitting: Geriatric Medicine

## 2016-06-23 DIAGNOSIS — R0609 Other forms of dyspnea: Principal | ICD-10-CM

## 2016-06-24 ENCOUNTER — Ambulatory Visit (HOSPITAL_COMMUNITY): Payer: Medicare Other | Attending: Internal Medicine

## 2016-06-24 ENCOUNTER — Encounter (INDEPENDENT_AMBULATORY_CARE_PROVIDER_SITE_OTHER): Payer: Self-pay

## 2016-06-24 ENCOUNTER — Other Ambulatory Visit: Payer: Self-pay

## 2016-06-24 DIAGNOSIS — Z6832 Body mass index (BMI) 32.0-32.9, adult: Secondary | ICD-10-CM | POA: Insufficient documentation

## 2016-06-24 DIAGNOSIS — R0609 Other forms of dyspnea: Secondary | ICD-10-CM | POA: Diagnosis not present

## 2016-06-24 DIAGNOSIS — E785 Hyperlipidemia, unspecified: Secondary | ICD-10-CM | POA: Insufficient documentation

## 2016-06-24 DIAGNOSIS — E669 Obesity, unspecified: Secondary | ICD-10-CM | POA: Insufficient documentation

## 2016-06-24 DIAGNOSIS — E119 Type 2 diabetes mellitus without complications: Secondary | ICD-10-CM | POA: Diagnosis not present

## 2016-06-24 DIAGNOSIS — I119 Hypertensive heart disease without heart failure: Secondary | ICD-10-CM | POA: Diagnosis not present

## 2016-06-24 DIAGNOSIS — R0600 Dyspnea, unspecified: Secondary | ICD-10-CM | POA: Diagnosis present

## 2016-06-24 DIAGNOSIS — I071 Rheumatic tricuspid insufficiency: Secondary | ICD-10-CM | POA: Insufficient documentation

## 2016-07-13 ENCOUNTER — Ambulatory Visit (INDEPENDENT_AMBULATORY_CARE_PROVIDER_SITE_OTHER): Payer: Medicare Other

## 2016-07-13 ENCOUNTER — Ambulatory Visit: Payer: Self-pay

## 2016-07-13 ENCOUNTER — Ambulatory Visit (INDEPENDENT_AMBULATORY_CARE_PROVIDER_SITE_OTHER): Payer: Medicare Other | Admitting: Podiatry

## 2016-07-13 ENCOUNTER — Encounter: Payer: Self-pay | Admitting: Podiatry

## 2016-07-13 VITALS — BP 139/78 | HR 81 | Resp 16

## 2016-07-13 DIAGNOSIS — L600 Ingrowing nail: Secondary | ICD-10-CM

## 2016-07-13 DIAGNOSIS — M79672 Pain in left foot: Secondary | ICD-10-CM

## 2016-07-13 DIAGNOSIS — M79671 Pain in right foot: Secondary | ICD-10-CM | POA: Diagnosis not present

## 2016-07-13 DIAGNOSIS — D169 Benign neoplasm of bone and articular cartilage, unspecified: Secondary | ICD-10-CM

## 2016-07-13 DIAGNOSIS — M79674 Pain in right toe(s): Secondary | ICD-10-CM

## 2016-07-13 DIAGNOSIS — M779 Enthesopathy, unspecified: Secondary | ICD-10-CM | POA: Diagnosis not present

## 2016-07-13 MED ORDER — TRIAMCINOLONE ACETONIDE 10 MG/ML IJ SUSP
10.0000 mg | Freq: Once | INTRAMUSCULAR | Status: AC
  Administered 2016-07-13: 10 mg

## 2016-07-13 NOTE — Patient Instructions (Signed)

## 2016-07-15 NOTE — Progress Notes (Signed)
Subjective:     Patient ID: [name], female   DOB: 1951-07-13, 65 y.o.   MRN: [id number]  HPI patient was concerned about correction of the right nail sure that it's healing okay and also states she hasn't ingrown left that she cannot get out herself and it's painful   Review of Systems     Objective:   Physical Exam Neurovascular status intact muscle strength adequate range of motion within normal limits with patient found to have a incurvated left hallux nail lateral border that's painful and a well-healed surgical site right hallux but does have some dystrophic changes which is relatively normal for this particular procedure    Assessment:     Ingrown toenail deformity left hallux with well-healed surgical site right    Plan:     H&P conditions reviewed and recommended correction of the left nail bed. Explained procedure and risk and today I infiltrated the left hallux 60 mg I can Marcaine mixture remove the border exposed matrix and applied phenol 3 applications 30 seconds followed by alcohol lavage and sterile dressing. Gave instructions on soaks and reappoint

## 2016-08-03 ENCOUNTER — Telehealth: Payer: Self-pay | Admitting: *Deleted

## 2016-08-03 NOTE — Telephone Encounter (Signed)
Pt states she had an ingrown toenail procedure and the injection site is becoming red, and blistery. I spoke with pt and she states the blisteraansd redness are traveling up her toe and she can barely put weight on the foot. I instructed pt to begin epsom salt soaks 2 times daily, then cover the area with a Bacitracin bandaid. Pt asked that I repeat orders to her husband. I repeated the orders and also transferred pt to schedulers toget pt in to be seen tomorrow.

## 2016-08-04 ENCOUNTER — Telehealth: Payer: Self-pay | Admitting: *Deleted

## 2016-08-04 ENCOUNTER — Encounter: Payer: Self-pay | Admitting: Sports Medicine

## 2016-08-04 ENCOUNTER — Ambulatory Visit (INDEPENDENT_AMBULATORY_CARE_PROVIDER_SITE_OTHER): Payer: Medicare Other | Admitting: Sports Medicine

## 2016-08-04 DIAGNOSIS — Z9889 Other specified postprocedural states: Secondary | ICD-10-CM

## 2016-08-04 DIAGNOSIS — L03032 Cellulitis of left toe: Secondary | ICD-10-CM | POA: Diagnosis not present

## 2016-08-04 DIAGNOSIS — M79675 Pain in left toe(s): Secondary | ICD-10-CM | POA: Diagnosis not present

## 2016-08-04 MED ORDER — DOXYCYCLINE HYCLATE 100 MG PO TABS: 100.0000 mg | ORAL_TABLET | Freq: Two times a day (BID) | ORAL | 0 refills | Status: DC

## 2016-08-04 NOTE — Patient Instructions (Addendum)
Epsom salt Soak Instructions  Purchase an 8 oz. bottle of BETADINE solution (Povidone)  THE DAY AFTER THE PROCEDURE  Place 1 tablespoon of Epsom in a quart of warm tap water.  Submerge your foot or feet with outer bandage intact for the initial soak; this will allow the bandage to become moist and wet for easy lift off.  Once you remove your bandage, continue to soak in the solution for 20 minutes.  This soak should be done once to twice a day.  Next, remove your foot or feet from solution, blot dry the affected area and cover.  You may use gauze and tape.  Apply other medications to the area as directed by the doctor.   Get cortisone cream OTC to use at rash at toe    IF YOUR SKIN BECOMES IRRITATED WHILE USING THESE INSTRUCTIONS, IT IS OKAY TO SWITCH TO EPSOM SALTS AND WATER.   Place 1/4 cup of epsom salts in a quart of warm tap water.  Submerge your foot or feet with outer bandage intact for the initial soak; this will allow the bandage to become moist and wet for easy lift off.  Once you remove your bandage, continue to soak in the solution for 20 minutes.  This soak should be done twice a day.  Next, remove your foot or feet from solution, blot dry the affected area and cover.  You may use a band aid large enough to cover the area or use gauze and tape.  Apply other medications to the area as directed by the doctor such as polysporin neosporin.  IF YOUR SKIN BECOMES IRRITATED WHILE USING THESE INSTRUCTIONS, IT IS OKAY TO SWITCH TO  WHITE VINEGAR AND WATER.    [place] Instructions-Post Nail Surgery  You have had your ingrown toenail and root treated with a chemical.  This chemical causes a burn that will drain and ooze like a blister.  This can drain for 6-8 weeks or longer.  It is important to keep this area clean, covered, and follow the soaking instructions dispensed at the time of your surgery.  This area will eventually dry and form a scab.  Once the scab forms you no longer need  to soak or apply a dressing.  If at any time you experience an increase in pain, redness, swelling, or drainage, you should contact the office as soon as possible.   Monitor for any signs/symptoms of infection. Call the office immediately if any occur or go directly to the emergency room. Call with any questions/concerns.

## 2016-08-04 NOTE — Telephone Encounter (Signed)
Left message for patient at [phone] (Home #) to check to see how they were doing from their ingrown toenail procedure that was performed on Monday, July 13, 2016. Waiting for a response.

## 2016-08-04 NOTE — Progress Notes (Signed)
Subjective: [name] is a 65 y.o. female patient returns to office today for follow up evaluation after having Left Hallux lateral permanent nail avulsion performed on 07-13-16. Patient has been soaking using epsom salt and applying topical antibiotic covered with guaze and bandaid daily, states that she has been doing this since she called on yesterday after noticing redness and rash at base of toenail bed with clear drainage. Thinks that she had a reaction to the injection at the time of procedure. Patient denies fever/chills/nausea/vomitting/any other related constitutional symptoms at this time.  Patient Active Problem List   Diagnosis Date Noted  . IIH (idiopathic intracranial hypertension) 01/18/2016  . 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 Worsening headaches 01/18/2016  . Increased abdominal girth 01/18/2016  . Abdominal fluid collection 01/18/2016  . Vision changes 01/18/2016  . Perceived hearing changes 01/18/2016  . Nausea without vomiting 01/18/2016  . Peripheral vision loss 01/18/2016  . Imbalance 01/18/2016  . Falls 01/18/2016  . Dysphagia 01/18/2016  . Subacute confusional state 01/18/2016  . Left breast mass 05/05/2011    Current Outpatient Prescriptions on File Prior to Visit  Medication Sig Dispense Refill  . acetaZOLAMIDE (DIAMOX) 250 MG tablet Take 1 tablet (250 mg total) by mouth 3 (three) times daily. 90 tablet 12  . cloNIDine (CATAPRES) 0.3 MG tablet Take 0.3 mg by mouth 2 (two) times daily.     . diclofenac sodium (VOLTAREN) 1 % GEL Apply 2 g topically 4 (four) times daily as needed (pain).     Marland Kitchen esomeprazole (NEXIUM) 40 MG capsule Take 40 mg by mouth daily as needed (acid reflux). Acid reflux    . estropipate (OGEN) 1.5 MG tablet Take 0.75 mg by mouth daily.     . fluconazole (DIFLUCAN) 150 MG tablet Take 1 tablet (150 mg total) by mouth once. 1 tablet 0  . furosemide (LASIX) 20 MG tablet Take 20 mg by  mouth daily.    Marland Kitchen glipiZIDE (GLUCOTROL) 10 MG tablet Take 10 mg by mouth 2 (two) times daily before a meal.     . insulin glargine (LANTUS) 100 UNIT/ML injection Inject 18 Units into the skin at bedtime.     . metFORMIN (GLUMETZA) 500 MG (MOD) 24 hr tablet Take 1,000 mg by mouth 2 (two) times daily with a meal.    . metoprolol (LOPRESSOR) 50 MG tablet Take 50 mg by mouth every evening.    . metoprolol (TOPROL-XL) 100 MG 24 hr tablet Take 100 mg by mouth daily.     Marland Kitchen PRESCRIPTION MEDICATION Take 20 mg by mouth 3 (three) times daily.    Marland Kitchen telmisartan (MICARDIS) 80 MG tablet Take 80 mg by mouth every evening.      No current facility-administered medications on file prior to visit.     Allergies  Allergen Reactions  . Carbamazepine Hives, Shortness Of Breath and Other (See Comments)    Unknown hives  . Penicillins Anaphylaxis, Other (See Comments) and Swelling    unknown My throat swells Familial history of anaphylaxis  . Statins Other (See Comments)    Unknown CHEST PAIN AND LEG NUMBNESS Severe muscle weakness, leg numbness, severe headaches Numbness, joints ache CHEST PAIN AND LEG NUMBNESS  . Tricyclic Antidepressants Other (See Comments)    IMPAIRED MEMORY  . Atorvastatin Other (See Comments)  . Ezetimibe Other (See Comments)  . Metoclopramide Other (See Comments)  . Nortriptyline Other (See Comments)    IMPAIRED MEMORY  . Other  Other (See Comments)  . Penicillin G Other (See Comments)  . Quinapril Hcl Other (See Comments)  . Rifampin Other (See Comments)    Objective:  General: Well developed, nourished, in no acute distress, alert and oriented x3   Dermatology: Skin is warm, dry and supple bilateral. Left hallux lateral nail bed appears to be clean, dry, with mild fibrotic tissue . (+) Erythema. (-) Edema. (-) serosanguous drainage present with rash proximal nail fold. The remaining nails appear unremarkable at this time. There are no other lesions or other signs of  infection  present.  Neurovascular status: Intact. No lower extremity swelling; No pain with calf compression bilateral.  Musculoskeletal: Mild tenderness to palpation of the Left lateral hallux and proximal nail fold(s). Muscular strength within normal limits bilateral.   Assesement and Plan: Problem List Items Addressed This Visit    None    Visit Diagnoses    S/P nail surgery    -  Primary   Relevant Medications   doxycycline (VIBRA-TABS) 100 MG tablet   Other Relevant Orders   WOUND CULTURE   Infection of nailbed of toe of left foot       with rash at proximal nail fold   Relevant Medications   doxycycline (VIBRA-TABS) 100 MG tablet   Other Relevant Orders   WOUND CULTURE   Toe pain, left       Relevant Orders   WOUND CULTURE     -Examined patient  -Culture obtained and cleansed left hallux lateral nail fold and gently scrubbed with antibacterial wash and curretted away nonviable tissue and applied antibiotic cream covered with bandaid.  -Discussed plan of care with patient. -Patient to continue soaking in a weak solution of Epsom salt and warm water. Patient was instructed to soak for 15-20 minutes each day until the toe appears normal and there is no drainage, redness, tenderness, or swelling at the procedure site, and apply bacitrician and a gauze or bandaid dressing each day as needed. May leave open to air at night. -Recommended cortisone cream at proximal nail fold at rash -Rx Doxycyline  -Educated patient on long term care after nail surgery. -Patient was instructed to monitor the toe for worsening signs of infection; Patient advised to return to office or go to ER if toe becomes red, hot or swollen. -Patient is to return 2 weeks or sooner if problems arise.  Landis Martins, DPM

## 2016-08-18 ENCOUNTER — Ambulatory Visit: Payer: Medicare Other | Admitting: Sports Medicine

## 2016-08-26 DIAGNOSIS — Z9889 Other specified postprocedural states: Secondary | ICD-10-CM

## 2016-11-18 ENCOUNTER — Other Ambulatory Visit: Payer: Self-pay | Admitting: Neurology

## 2017-05-28 ENCOUNTER — Other Ambulatory Visit: Payer: Self-pay | Admitting: Geriatric Medicine

## 2017-05-28 DIAGNOSIS — R29898 Other symptoms and signs involving the musculoskeletal system: Secondary | ICD-10-CM

## 2017-05-30 ENCOUNTER — Ambulatory Visit
Admission: RE | Admit: 2017-05-30 | Discharge: 2017-05-30 | Disposition: A | Discharge status: Home / Self Care | Payer: Medicare Other | Source: Ambulatory Visit | Attending: Geriatric Medicine | Admitting: Geriatric Medicine

## 2017-05-30 DIAGNOSIS — R29898 Other symptoms and signs involving the musculoskeletal system: Secondary | ICD-10-CM

## 2017-05-30 MED ORDER — GADOBENATE DIMEGLUMINE 529 MG/ML IV SOLN
15.0000 mL | Freq: Once | INTRAVENOUS | Status: AC | PRN
  Administered 2017-05-30: 15 mL via INTRAVENOUS

## 2017-05-31 ENCOUNTER — Telehealth: Payer: Self-pay | Admitting: Neurology

## 2017-05-31 ENCOUNTER — Inpatient Hospital Stay (HOSPITAL_COMMUNITY): Payer: Medicare Other

## 2017-05-31 ENCOUNTER — Encounter (HOSPITAL_COMMUNITY): Payer: Self-pay | Admitting: Emergency Medicine

## 2017-05-31 ENCOUNTER — Inpatient Hospital Stay (HOSPITAL_COMMUNITY)
Admission: EM | Admit: 2017-05-31 | Discharge: 2017-06-18 | DRG: 003 | Disposition: A | Discharge status: Rehabilitation Facility | Payer: Medicare Other | Attending: Internal Medicine | Admitting: Internal Medicine

## 2017-05-31 DIAGNOSIS — I6522 Occlusion and stenosis of left carotid artery: Secondary | ICD-10-CM | POA: Diagnosis not present

## 2017-05-31 DIAGNOSIS — I63412 Cerebral infarction due to embolism of left middle cerebral artery: Secondary | ICD-10-CM | POA: Diagnosis present

## 2017-05-31 DIAGNOSIS — I63132 Cerebral infarction due to embolism of left carotid artery: Secondary | ICD-10-CM | POA: Diagnosis not present

## 2017-05-31 DIAGNOSIS — R042 Hemoptysis: Secondary | ICD-10-CM | POA: Diagnosis not present

## 2017-05-31 DIAGNOSIS — E46 Unspecified protein-calorie malnutrition: Secondary | ICD-10-CM | POA: Diagnosis present

## 2017-05-31 DIAGNOSIS — I6523 Occlusion and stenosis of bilateral carotid arteries: Secondary | ICD-10-CM | POA: Diagnosis present

## 2017-05-31 DIAGNOSIS — Z515 Encounter for palliative care: Secondary | ICD-10-CM

## 2017-05-31 DIAGNOSIS — E871 Hypo-osmolality and hyponatremia: Secondary | ICD-10-CM | POA: Diagnosis present

## 2017-05-31 DIAGNOSIS — W19XXXA Unspecified fall, initial encounter: Secondary | ICD-10-CM | POA: Diagnosis present

## 2017-05-31 DIAGNOSIS — G8194 Hemiplegia, unspecified affecting left nondominant side: Secondary | ICD-10-CM | POA: Diagnosis present

## 2017-05-31 DIAGNOSIS — E785 Hyperlipidemia, unspecified: Secondary | ICD-10-CM | POA: Diagnosis present

## 2017-05-31 DIAGNOSIS — I639 Cerebral infarction, unspecified: Secondary | ICD-10-CM | POA: Diagnosis present

## 2017-05-31 DIAGNOSIS — Z01818 Encounter for other preprocedural examination: Secondary | ICD-10-CM

## 2017-05-31 DIAGNOSIS — G9340 Encephalopathy, unspecified: Secondary | ICD-10-CM | POA: Diagnosis present

## 2017-05-31 DIAGNOSIS — Z789 Other specified health status: Secondary | ICD-10-CM | POA: Diagnosis not present

## 2017-05-31 DIAGNOSIS — E0800 Diabetes mellitus due to underlying condition with hyperosmolarity without nonketotic hyperglycemic-hyperosmolar coma (NKHHC): Secondary | ICD-10-CM | POA: Diagnosis not present

## 2017-05-31 DIAGNOSIS — Z794 Long term (current) use of insulin: Secondary | ICD-10-CM | POA: Diagnosis not present

## 2017-05-31 DIAGNOSIS — D649 Anemia, unspecified: Secondary | ICD-10-CM | POA: Diagnosis present

## 2017-05-31 DIAGNOSIS — J9621 Acute and chronic respiratory failure with hypoxia: Secondary | ICD-10-CM | POA: Diagnosis not present

## 2017-05-31 DIAGNOSIS — R400 Somnolence: Secondary | ICD-10-CM | POA: Diagnosis not present

## 2017-05-31 DIAGNOSIS — I69351 Hemiplegia and hemiparesis following cerebral infarction affecting right dominant side: Secondary | ICD-10-CM | POA: Diagnosis not present

## 2017-05-31 DIAGNOSIS — G8918 Other acute postprocedural pain: Secondary | ICD-10-CM | POA: Diagnosis not present

## 2017-05-31 DIAGNOSIS — Z0189 Encounter for other specified special examinations: Secondary | ICD-10-CM

## 2017-05-31 DIAGNOSIS — F4323 Adjustment disorder with mixed anxiety and depressed mood: Secondary | ICD-10-CM | POA: Diagnosis not present

## 2017-05-31 DIAGNOSIS — R338 Other retention of urine: Secondary | ICD-10-CM | POA: Diagnosis not present

## 2017-05-31 DIAGNOSIS — D62 Acute posthemorrhagic anemia: Secondary | ICD-10-CM

## 2017-05-31 DIAGNOSIS — F411 Generalized anxiety disorder: Secondary | ICD-10-CM | POA: Diagnosis not present

## 2017-05-31 DIAGNOSIS — I5032 Chronic diastolic (congestive) heart failure: Secondary | ICD-10-CM | POA: Diagnosis present

## 2017-05-31 DIAGNOSIS — E118 Type 2 diabetes mellitus with unspecified complications: Secondary | ICD-10-CM | POA: Diagnosis not present

## 2017-05-31 DIAGNOSIS — E11649 Type 2 diabetes mellitus with hypoglycemia without coma: Secondary | ICD-10-CM | POA: Diagnosis present

## 2017-05-31 DIAGNOSIS — G8191 Hemiplegia, unspecified affecting right dominant side: Secondary | ICD-10-CM | POA: Diagnosis present

## 2017-05-31 DIAGNOSIS — I10 Essential (primary) hypertension: Secondary | ICD-10-CM | POA: Diagnosis present

## 2017-05-31 DIAGNOSIS — R1310 Dysphagia, unspecified: Secondary | ICD-10-CM | POA: Diagnosis not present

## 2017-05-31 DIAGNOSIS — E1165 Type 2 diabetes mellitus with hyperglycemia: Secondary | ICD-10-CM | POA: Diagnosis present

## 2017-05-31 DIAGNOSIS — D72829 Elevated white blood cell count, unspecified: Secondary | ICD-10-CM

## 2017-05-31 DIAGNOSIS — E876 Hypokalemia: Secondary | ICD-10-CM

## 2017-05-31 DIAGNOSIS — R339 Retention of urine, unspecified: Secondary | ICD-10-CM | POA: Diagnosis not present

## 2017-05-31 DIAGNOSIS — R531 Weakness: Secondary | ICD-10-CM | POA: Diagnosis not present

## 2017-05-31 DIAGNOSIS — J9601 Acute respiratory failure with hypoxia: Secondary | ICD-10-CM | POA: Diagnosis not present

## 2017-05-31 DIAGNOSIS — R34 Anuria and oliguria: Secondary | ICD-10-CM | POA: Diagnosis not present

## 2017-05-31 DIAGNOSIS — N390 Urinary tract infection, site not specified: Secondary | ICD-10-CM | POA: Diagnosis not present

## 2017-05-31 DIAGNOSIS — J384 Edema of larynx: Secondary | ICD-10-CM | POA: Diagnosis present

## 2017-05-31 DIAGNOSIS — I6300 Cerebral infarction due to thrombosis of unspecified precerebral artery: Secondary | ICD-10-CM | POA: Diagnosis not present

## 2017-05-31 DIAGNOSIS — E782 Mixed hyperlipidemia: Secondary | ICD-10-CM | POA: Diagnosis not present

## 2017-05-31 DIAGNOSIS — R112 Nausea with vomiting, unspecified: Secondary | ICD-10-CM | POA: Diagnosis not present

## 2017-05-31 DIAGNOSIS — I6340 Cerebral infarction due to embolism of unspecified cerebral artery: Secondary | ICD-10-CM | POA: Diagnosis present

## 2017-05-31 DIAGNOSIS — I671 Cerebral aneurysm, nonruptured: Secondary | ICD-10-CM | POA: Diagnosis present

## 2017-05-31 DIAGNOSIS — I63232 Cerebral infarction due to unspecified occlusion or stenosis of left carotid arteries: Secondary | ICD-10-CM | POA: Diagnosis not present

## 2017-05-31 DIAGNOSIS — Z930 Tracheostomy status: Secondary | ICD-10-CM

## 2017-05-31 DIAGNOSIS — J9600 Acute respiratory failure, unspecified whether with hypoxia or hypercapnia: Secondary | ICD-10-CM | POA: Diagnosis not present

## 2017-05-31 DIAGNOSIS — I110 Hypertensive heart disease with heart failure: Secondary | ICD-10-CM | POA: Diagnosis present

## 2017-05-31 DIAGNOSIS — J9690 Respiratory failure, unspecified, unspecified whether with hypoxia or hypercapnia: Secondary | ICD-10-CM | POA: Diagnosis not present

## 2017-05-31 DIAGNOSIS — H9201 Otalgia, right ear: Secondary | ICD-10-CM | POA: Diagnosis not present

## 2017-05-31 DIAGNOSIS — R569 Unspecified convulsions: Secondary | ICD-10-CM | POA: Diagnosis not present

## 2017-05-31 DIAGNOSIS — E1149 Type 2 diabetes mellitus with other diabetic neurological complication: Secondary | ICD-10-CM | POA: Diagnosis not present

## 2017-05-31 DIAGNOSIS — I6603 Occlusion and stenosis of bilateral middle cerebral arteries: Secondary | ICD-10-CM | POA: Diagnosis not present

## 2017-05-31 DIAGNOSIS — I509 Heart failure, unspecified: Secondary | ICD-10-CM

## 2017-05-31 DIAGNOSIS — K5900 Constipation, unspecified: Secondary | ICD-10-CM

## 2017-05-31 DIAGNOSIS — E1143 Type 2 diabetes mellitus with diabetic autonomic (poly)neuropathy: Secondary | ICD-10-CM | POA: Diagnosis present

## 2017-05-31 DIAGNOSIS — J15211 Pneumonia due to Methicillin susceptible Staphylococcus aureus: Secondary | ICD-10-CM | POA: Diagnosis present

## 2017-05-31 DIAGNOSIS — G40909 Epilepsy, unspecified, not intractable, without status epilepticus: Secondary | ICD-10-CM | POA: Diagnosis present

## 2017-05-31 DIAGNOSIS — R0902 Hypoxemia: Secondary | ICD-10-CM | POA: Diagnosis not present

## 2017-05-31 DIAGNOSIS — H53461 Homonymous bilateral field defects, right side: Secondary | ICD-10-CM | POA: Diagnosis not present

## 2017-05-31 DIAGNOSIS — I63313 Cerebral infarction due to thrombosis of bilateral middle cerebral arteries: Secondary | ICD-10-CM | POA: Diagnosis not present

## 2017-05-31 DIAGNOSIS — G932 Benign intracranial hypertension: Secondary | ICD-10-CM

## 2017-05-31 DIAGNOSIS — K3184 Gastroparesis: Secondary | ICD-10-CM | POA: Diagnosis present

## 2017-05-31 DIAGNOSIS — E1142 Type 2 diabetes mellitus with diabetic polyneuropathy: Secondary | ICD-10-CM | POA: Diagnosis present

## 2017-05-31 DIAGNOSIS — Z4659 Encounter for fitting and adjustment of other gastrointestinal appliance and device: Secondary | ICD-10-CM

## 2017-05-31 DIAGNOSIS — E440 Moderate protein-calorie malnutrition: Secondary | ICD-10-CM | POA: Diagnosis not present

## 2017-05-31 DIAGNOSIS — Z6828 Body mass index (BMI) 28.0-28.9, adult: Secondary | ICD-10-CM

## 2017-05-31 DIAGNOSIS — K920 Hematemesis: Secondary | ICD-10-CM | POA: Diagnosis not present

## 2017-05-31 DIAGNOSIS — Z7982 Long term (current) use of aspirin: Secondary | ICD-10-CM

## 2017-05-31 DIAGNOSIS — K219 Gastro-esophageal reflux disease without esophagitis: Secondary | ICD-10-CM | POA: Diagnosis present

## 2017-05-31 DIAGNOSIS — J40 Bronchitis, not specified as acute or chronic: Secondary | ICD-10-CM | POA: Diagnosis present

## 2017-05-31 DIAGNOSIS — R061 Stridor: Secondary | ICD-10-CM | POA: Diagnosis present

## 2017-05-31 DIAGNOSIS — G441 Vascular headache, not elsewhere classified: Secondary | ICD-10-CM | POA: Diagnosis not present

## 2017-05-31 DIAGNOSIS — R140 Abdominal distension (gaseous): Secondary | ICD-10-CM | POA: Diagnosis not present

## 2017-05-31 DIAGNOSIS — Z79899 Other long term (current) drug therapy: Secondary | ICD-10-CM

## 2017-05-31 DIAGNOSIS — Z79890 Hormone replacement therapy (postmenopausal): Secondary | ICD-10-CM | POA: Diagnosis not present

## 2017-05-31 DIAGNOSIS — Z7189 Other specified counseling: Secondary | ICD-10-CM | POA: Diagnosis not present

## 2017-05-31 DIAGNOSIS — R4182 Altered mental status, unspecified: Secondary | ICD-10-CM | POA: Diagnosis not present

## 2017-05-31 DIAGNOSIS — R1312 Dysphagia, oropharyngeal phase: Secondary | ICD-10-CM | POA: Diagnosis not present

## 2017-05-31 LAB — DIFFERENTIAL
Basophils Absolute: 0 10*3/uL (ref 0.0–0.1)
Basophils Relative: 0 %
Eosinophils Absolute: 0.2 10*3/uL (ref 0.0–0.7)
Eosinophils Relative: 1 %
LYMPHS ABS: 2.1 10*3/uL (ref 0.7–4.0)
LYMPHS PCT: 19 %
MONO ABS: 0.6 10*3/uL (ref 0.1–1.0)
Monocytes Relative: 5 %
NEUTROS ABS: 8.3 10*3/uL — AB (ref 1.7–7.7)
Neutrophils Relative %: 75 %

## 2017-05-31 LAB — COMPREHENSIVE METABOLIC PANEL
ALK PHOS: 159 U/L — AB (ref 38–126)
ALT: 26 U/L (ref 14–54)
AST: 43 U/L — ABNORMAL HIGH (ref 15–41)
Albumin: 3.8 g/dL (ref 3.5–5.0)
Anion gap: 9 (ref 5–15)
BUN: 21 mg/dL — ABNORMAL HIGH (ref 6–20)
CALCIUM: 9.4 mg/dL (ref 8.9–10.3)
CHLORIDE: 101 mmol/L (ref 101–111)
CO2: 23 mmol/L (ref 22–32)
CREATININE: 1.18 mg/dL — AB (ref 0.44–1.00)
GFR calc Af Amer: 55 mL/min — ABNORMAL LOW (ref >60)
GFR calc non Af Amer: 47 mL/min — ABNORMAL LOW (ref >60)
Glucose, Bld: 297 mg/dL — ABNORMAL HIGH (ref 65–99)
Potassium: 4.5 mmol/L (ref 3.5–5.1)
SODIUM: 133 mmol/L — AB (ref 135–145)
Total Bilirubin: 0.4 mg/dL (ref 0.3–1.2)
Total Protein: 7.4 g/dL (ref 6.5–8.1)

## 2017-05-31 LAB — CBC
HCT: 36.6 % (ref 36.0–46.0)
HEMOGLOBIN: 11.9 g/dL — AB (ref 12.0–15.0)
MCH: 27.7 pg (ref 26.0–34.0)
MCHC: 32.5 g/dL (ref 30.0–36.0)
MCV: 85.1 fL (ref 78.0–100.0)
PLATELETS: 346 10*3/uL (ref 150–400)
RBC: 4.3 MIL/uL (ref 3.87–5.11)
RDW: 14.1 % (ref 11.5–15.5)
WBC: 11.1 10*3/uL — AB (ref 4.0–10.5)

## 2017-05-31 LAB — I-STAT TROPONIN, ED: Troponin i, poc: 0 ng/mL (ref 0.00–0.08)

## 2017-05-31 LAB — CBG MONITORING, ED: Glucose-Capillary: 160 mg/dL — ABNORMAL HIGH (ref 65–99)

## 2017-05-31 LAB — I-STAT CHEM 8, ED
BUN: 25 mg/dL — AB (ref 6–20)
CALCIUM ION: 1.07 mmol/L — AB (ref 1.15–1.40)
CREATININE: 1.2 mg/dL — AB (ref 0.44–1.00)
Chloride: 102 mmol/L (ref 101–111)
Glucose, Bld: 306 mg/dL — ABNORMAL HIGH (ref 65–99)
HEMATOCRIT: 38 % (ref 36.0–46.0)
HEMOGLOBIN: 12.9 g/dL (ref 12.0–15.0)
Potassium: 4.5 mmol/L (ref 3.5–5.1)
Sodium: 134 mmol/L — ABNORMAL LOW (ref 135–145)
TCO2: 23 mmol/L (ref 0–100)

## 2017-05-31 LAB — PROTIME-INR
INR: 0.99
PROTHROMBIN TIME: 13.1 s (ref 11.4–15.2)

## 2017-05-31 LAB — APTT: aPTT: 25 seconds (ref 24–36)

## 2017-05-31 MED ORDER — INSULIN ASPART 100 UNIT/ML ~~LOC~~ SOLN
0.0000 [IU] | Freq: Three times a day (TID) | SUBCUTANEOUS | Status: DC
  Administered 2017-06-01: 7 [IU] via SUBCUTANEOUS
  Administered 2017-06-01: 3 [IU] via SUBCUTANEOUS

## 2017-05-31 MED ORDER — INSULIN GLARGINE 100 UNIT/ML ~~LOC~~ SOLN
18.0000 [IU] | Freq: Every day | SUBCUTANEOUS | Status: DC
  Administered 2017-05-31: 18 [IU] via SUBCUTANEOUS
  Filled 2017-05-31 (×2): qty 0.18

## 2017-05-31 MED ORDER — ESTROPIPATE 1.5 MG PO TABS
0.7500 mg | ORAL_TABLET | Freq: Every day | ORAL | Status: DC
  Administered 2017-06-01 – 2017-06-02 (×2): 0.75 mg via ORAL
  Filled 2017-05-31 (×4): qty 0.5
  Filled 2017-05-31: qty 1

## 2017-05-31 MED ORDER — STROKE: EARLY STAGES OF RECOVERY BOOK
Freq: Once | Status: AC
  Administered 2017-05-31: 23:00:00

## 2017-05-31 MED ORDER — IOPAMIDOL (ISOVUE-370) INJECTION 76%
INTRAVENOUS | Status: AC
Start: 2017-05-31 — End: 2017-05-31
  Administered 2017-05-31: 50 mL via INTRAVENOUS
  Filled 2017-05-31: qty 50

## 2017-05-31 MED ORDER — IOPAMIDOL (ISOVUE-370) INJECTION 76%
INTRAVENOUS | Status: AC
  Filled 2017-05-31: qty 50

## 2017-05-31 MED ORDER — DICLOFENAC SODIUM 1 % TD GEL
2.0000 g | Freq: Four times a day (QID) | TRANSDERMAL | Status: DC | PRN
  Administered 2017-06-01: 2 g via TOPICAL
  Filled 2017-05-31 (×2): qty 100

## 2017-05-31 MED ORDER — PANTOPRAZOLE SODIUM 40 MG PO TBEC: 40.0000 mg | DELAYED_RELEASE_TABLET | Freq: Every day | ORAL | Status: DC | PRN

## 2017-05-31 MED ORDER — CLONIDINE HCL 0.1 MG PO TABS
0.3000 mg | ORAL_TABLET | Freq: Two times a day (BID) | ORAL | Status: DC
  Administered 2017-05-31: 0.3 mg via ORAL
  Filled 2017-05-31: qty 3

## 2017-05-31 MED ORDER — METOPROLOL TARTRATE 50 MG PO TABS
50.0000 mg | ORAL_TABLET | Freq: Every day | ORAL | Status: DC
  Administered 2017-05-31 – 2017-06-02 (×3): 50 mg via ORAL
  Filled 2017-05-31 (×3): qty 1

## 2017-05-31 MED ORDER — ACETAMINOPHEN 160 MG/5ML PO SOLN: 650.0000 mg | ORAL | Status: DC | PRN

## 2017-05-31 MED ORDER — ASPIRIN EC 325 MG PO TBEC
325.0000 mg | DELAYED_RELEASE_TABLET | Freq: Every day | ORAL | Status: DC
  Administered 2017-06-02: 325 mg via ORAL
  Filled 2017-05-31 (×2): qty 1

## 2017-05-31 MED ORDER — ASPIRIN 81 MG PO CHEW
324.0000 mg | CHEWABLE_TABLET | Freq: Once | ORAL | Status: DC
  Filled 2017-05-31: qty 4

## 2017-05-31 MED ORDER — ACETAMINOPHEN 325 MG PO TABS
650.0000 mg | ORAL_TABLET | ORAL | Status: DC | PRN
  Administered 2017-06-01 – 2017-06-03 (×2): 650 mg via ORAL
  Filled 2017-05-31 (×2): qty 2

## 2017-05-31 MED ORDER — SODIUM CHLORIDE 0.9 % IV SOLN: INTRAVENOUS | Status: DC

## 2017-05-31 MED ORDER — METFORMIN HCL ER 500 MG PO TB24
1000.0000 mg | ORAL_TABLET | Freq: Two times a day (BID) | ORAL | Status: DC
  Filled 2017-05-31: qty 2

## 2017-05-31 MED ORDER — ACETAMINOPHEN 650 MG RE SUPP: 650.0000 mg | RECTAL | Status: DC | PRN

## 2017-05-31 MED ORDER — METOPROLOL SUCCINATE ER 50 MG PO TB24
100.0000 mg | ORAL_TABLET | Freq: Every day | ORAL | Status: DC
  Administered 2017-06-01 – 2017-06-09 (×8): 100 mg via ORAL
  Filled 2017-05-31: qty 2
  Filled 2017-05-31: qty 1
  Filled 2017-05-31 (×5): qty 2
  Filled 2017-05-31: qty 1

## 2017-05-31 NOTE — ED Provider Notes (Signed)
[facility] DEPT Provider Note   CSN: [id number] Arrival date & time: 05/31/17  1557     History   Chief Complaint Chief Complaint  Patient presents with  . Headache  . Cerebrovascular Accident    HPI HAYLE PARISI is a 66 y.o. female with history of hypertension, HLD, DM, pseudotumor cerebri who presents with right hand weakness and MRI findings significant for multiple areas of acute infarction. Patient reports onset of symptoms last week, when she did not feel well with intermittent headaches. Seems that she has had multiple falls throughout the week. On Thursday at approximately 12:00 PM, she developed right hand weakness. She was seen by her neurologist on Friday and had an MRI of the brain on 8/13 which was significant for multiple small areas of acute infarct in the left parietal cortex and left occipital cortex, with recommendation for CTA head and neck to evaluate for source of emboli or stenosis. Patient denies any new symptoms since MRI was obtained. She currently denies any weakness, changes in vision, numbness or tingling.  HPI  Past Medical History:  Diagnosis Date  . Anemia    takes iron supplement  . Anesthesia complication    disorientation due to pseudotumor  . Diabetes mellitus    IDDM  . Family history of anesthesia complication 42 yrs ago   brother stopped breathing for a minute or two  . GERD (gastroesophageal reflux disease)   . Headache(784.0)    due to pseudotumor; daily headache  . Hypercholesteremia    unable to tolerate statins  . Hypertension    under control with meds., has been on med. x 5 yr.  . Peripheral neuropathy   . PONV (postoperative nausea and vomiting)   . 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 Synovitis of ankle 04/2013   left  . Wears dentures    full    Patient Active Problem List   Diagnosis Date Noted  . Stroke ([place]) 05/31/2017  . Type II  diabetes mellitus with complication ([place]) 27/03/2375  . Hypertension 05/31/2017  . Hyponatremia 05/31/2017  . IIH (idiopathic intracranial hypertension) 01/18/2016  . Malfunction of ventriculo-peritoneal shunt ([place]) 01/18/2016  . 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 Worsening headaches 01/18/2016  . Increased abdominal girth 01/18/2016  . Abdominal fluid collection 01/18/2016  . Vision changes 01/18/2016  . Perceived hearing changes 01/18/2016  . Nausea without vomiting 01/18/2016  . Peripheral vision loss 01/18/2016  . Imbalance 01/18/2016  . Falls 01/18/2016  . Dysphagia 01/18/2016  . Subacute confusional state 01/18/2016  . Left breast mass 05/05/2011    Past Surgical History:  Procedure Laterality Date  . ANKLE ARTHROSCOPY Left 05/18/2013   Procedure: LEFT ANKLE ARTHROSCOPY WITH DEBRIDEMENT,  SUBTALAR OPEN DEBRIDEMENT ;  Surgeon: Wylene Simmer, MD;  Location: [facility];  Service: Orthopedics;  Laterality: Left;  . APPENDECTOMY     as a child  . BALLOON DILATION N/A 07/04/2013   Procedure: BALLOON DILATION;  Surgeon: Garlan Fair, MD;  Location: [facility];  Service: Endoscopy;  Laterality: N/A;  . BLADDER SUSPENSION    . BREAST LUMPECTOMY W/ NEEDLE LOCALIZATION Left 05/22/2011  . COLONOSCOPY WITH PROPOFOL N/A 05/14/2014   Procedure: COLONOSCOPY WITH PROPOFOL;  Surgeon: Garlan Fair, MD;  Location: [facility];  Service: Endoscopy;  Laterality: N/A;  . ESOPHAGEAL DILATION  06/23/2006; 08/05/2004  . ESOPHAGOGASTRODUODENOSCOPY N/A 07/04/2013   Procedure:  ESOPHAGOGASTRODUODENOSCOPY (EGD);  Surgeon: Garlan Fair, MD;  Location: [facility];  Service: Endoscopy;  Laterality: N/A;  . ESOPHAGOGASTRODUODENOSCOPY (EGD) WITH PROPOFOL N/A 05/14/2014   Procedure: ESOPHAGOGASTRODUODENOSCOPY (EGD) WITH PROPOFOL;  Surgeon: Garlan Fair, MD;  Location: [facility];  Service: Endoscopy;  Laterality: N/A;  . LUMBAR PERITONEAL SHUNT     x 2  . OVARIAN CYST REMOVAL     age 69    . SHUNT REVISION  2007  . TOTAL ABDOMINAL HYSTERECTOMY W/ BILATERAL SALPINGOOPHORECTOMY  1994   age 41s  . WRIST SURGERY Left 2014    [name]    OB History    No data available       Home Medications    Prior to Admission medications   Medication Sig Start Date End Date Taking? Authorizing Provider  aspirin EC 81 MG tablet Take 81 mg by mouth daily.   Yes [provider]  cloNIDine (CATAPRES) 0.3 MG tablet Take 0.3 mg by mouth 2 (two) times daily.    Yes [provider]  diclofenac sodium (VOLTAREN) 1 % GEL Apply 2 application topically 4 (four) times daily as needed (pain).    Yes [provider]  esomeprazole (NEXIUM) 40 MG capsule Take 40 mg by mouth daily as needed (acid reflux). Acid reflux   Yes [provider]  estropipate (OGEN) 1.5 MG tablet Take 0.75 mg by mouth daily.    Yes [provider]  furosemide (LASIX) 20 MG tablet Take 20 mg by mouth daily.   Yes [provider]  gabapentin (NEURONTIN) 100 MG capsule Take 100 mg by mouth at bedtime.   Yes [provider]  glipiZIDE (GLUCOTROL) 10 MG tablet Take 10 mg by mouth 2 (two) times daily before a meal.    Yes [provider]  insulin glargine (LANTUS) 100 unit/mL SOPN Inject 32 Units into the skin at bedtime.   Yes [provider]  Melatonin 10 MG TABS Take 10 mg by mouth at bedtime as needed (sleep).   Yes [provider]  metFORMIN (GLUCOPHAGE-XR) 500 MG 24 hr tablet Take 1,000 mg by mouth 3 (three) times daily with meals.   Yes [provider]  metoprolol (LOPRESSOR) 50 MG tablet Take 50 mg by mouth at bedtime.    Yes [provider]  metoprolol (TOPROL-XL) 100 MG 24 hr tablet Take 100 mg by mouth daily with breakfast.    Yes [provider]  PRESCRIPTION MEDICATION Take 20 mg by mouth See admin instructions. Domperidone 10 mg from [place]: Take 2 tablets (20 mg) by mouth three times daily with meals -  for diabetic gastroparesis   Yes [provider]  telmisartan (MICARDIS) 80 MG tablet Take 80 mg by mouth at bedtime.    Yes [provider]  acetaZOLAMIDE (DIAMOX) 250 MG tablet Take 1 tablet (250 mg total) by mouth 3 (three) times daily. Patient not taking: Reported on 05/31/2017 01/15/16   Melvenia Beam, MD    Family History Family History  Problem Relation Age of Onset  . Heart disease Father   . Migraines Neg Hx     Social History Social History  Substance Use Topics  . Smoking status: Never Smoker  . Smokeless tobacco: Never Used  . Alcohol use No     Allergies   Carbamazepine; Penicillins; Statins; Tricyclic antidepressants; Atorvastatin; Ezetimibe; Nortriptyline; Quinapril hcl; Rifampin; and Metoclopramide   Review of Systems Review of Systems  Constitutional: Negative for chills and fever.  HENT: Negative for ear pain and sore throat.   Eyes: Negative for pain and visual disturbance.  Respiratory: Negative for cough and shortness of breath.   Cardiovascular: Negative for chest pain and palpitations.  Gastrointestinal: Negative for abdominal pain and vomiting.  Genitourinary: Negative for dysuria and hematuria.  Musculoskeletal: Negative for arthralgias and back pain.  Skin: Negative for color change and rash.  Neurological: Positive for weakness and headaches. Negative for seizures and syncope.  All other systems reviewed and are negative.    Physical Exam Updated Vital Signs BP (!) 172/77 (BP Location: Right Arm)   Pulse 75   Temp 98.1 F (36.7 C)   Resp 16   Ht 5\' 5"  (1.651 m)   Wt 73.3 kg (161 lb 8 oz)   SpO2 100%   BMI 26.88 kg/m   Physical Exam  Constitutional: She is oriented to person, place, and time. She appears well-developed and well-nourished. No distress.  HENT:  Head: Normocephalic and atraumatic.  Eyes: Conjunctivae are normal.  Neck: Neck supple.  Cardiovascular: Normal rate and regular rhythm.   No murmur  heard. Pulmonary/Chest: Effort normal and breath sounds normal. No respiratory distress.  Abdominal: Soft. There is no tenderness.  Musculoskeletal: She exhibits no edema.  Neurological: She is alert and oriented to person, place, and time. No cranial nerve deficit or sensory deficit. She exhibits normal muscle tone. Coordination normal.  Skin: Skin is warm and dry.  Psychiatric: She has a normal mood and affect.  Nursing note and vitals reviewed.    ED Treatments / Results  Labs (all labs ordered are listed, but only abnormal results are displayed) Labs Reviewed  CBC - Abnormal; Notable for the following:       Result Value   WBC 11.1 (*)    Hemoglobin 11.9 (*)    All other components within normal limits  DIFFERENTIAL - Abnormal; Notable for the following:    Neutro Abs 8.3 (*)    All other components within normal limits  COMPREHENSIVE METABOLIC PANEL - Abnormal; Notable for the following:    Sodium 133 (*)    Glucose, Bld 297 (*)    BUN 21 (*)    Creatinine, Ser 1.18 (*)    AST 43 (*)    Alkaline Phosphatase 159 (*)    GFR calc non Af Amer 47 (*)    GFR calc Af Amer 55 (*)    All other components within normal limits  GLUCOSE, CAPILLARY - Abnormal; Notable for the following:    Glucose-Capillary 112 (*)    All other components within normal limits  CBG MONITORING, ED - Abnormal; Notable for the following:    Glucose-Capillary 160 (*)    All other components within normal limits  I-STAT CHEM 8, ED - Abnormal; Notable for the following:    Sodium 134 (*)    BUN 25 (*)    Creatinine, Ser 1.20 (*)    Glucose, Bld 306 (*)    Calcium, Ion 1.07 (*)    All other components within normal limits  PROTIME-INR  APTT  GAMMA GT  HIV ANTIBODY (ROUTINE TESTING)  HEMOGLOBIN A1C  LIPID PANEL  COMPREHENSIVE METABOLIC PANEL  CBC  TSH  VITAMIN B12  SEDIMENTATION RATE  I-STAT TROPONIN, ED    EKG  EKG Interpretation  Date/Time:  Monday May 31 2017 16:27:58  EDT Ventricular Rate:  76 PR Interval:  148 QRS Duration: 76 QT Interval:  386 QTC Calculation: 434 R Axis:   18 Text  Interpretation:  Normal sinus rhythm Cannot rule out Anterior infarct , age undetermined Abnormal ECG no significant change compared to 2016 Confirmed by Sherwood Gambler [phone]) on 05/31/2017 6:31:50 PM       Radiology Ct Angio Head W Or Wo Contrast  Result Date: 05/31/2017 CLINICAL DATA:  Headache and right arm weakness EXAM: CT ANGIOGRAPHY HEAD AND NECK TECHNIQUE: Multidetector CT imaging of the head and neck was performed using the standard protocol during bolus administration of intravenous contrast. Multiplanar CT image reconstructions and MIPs were obtained to evaluate the vascular anatomy. Carotid stenosis measurements (when applicable) are obtained utilizing NASCET criteria, using the distal internal carotid diameter as the denominator. CONTRAST:  50 mL Isovue 370 COMPARISON:  None. FINDINGS: CT HEAD FINDINGS Brain: No mass lesion, intraparenchymal hemorrhage or extra-axial collection. No evidence of acute cortical infarct. Brain parenchyma and CSF-containing spaces are normal for age. Vascular: No hyperdense vessel or unexpected calcification. Skull: Normal visualized skull base, calvarium and extracranial soft tissues. Sinuses/Orbits: No sinus fluid levels or advanced mucosal thickening. No mastoid effusion. Normal orbits. Review of the MIP images confirms the above findings CTA NECK FINDINGS Aortic arch: There is no aneurysm or dissection of the visualized ascending aorta or aortic arch. Normal 3 vessel aortic branching pattern. The visualized proximal subclavian arteries are normal. Mild calcific aortic atherosclerosis. Right carotid system: The right common carotid origin is widely patent. There is no common carotid or internal carotid artery dissection or aneurysm. There is mixed calcified and noncalcified plaque at the right carotid bifurcation without hemodynamically  significant stenosis. Left carotid system: There is predominantly noncalcified plaque within the proximal left internal carotid artery. This results in stenosis that measures 60% by NASCET criteria. Subjectively, the stenosis appears severe. Vertebral arteries: The vertebral system is left dominant. Both vertebral artery origins are normal. Both vertebral arteries are normal to their confluence with the basilar artery. Skeleton: There is no bony spinal canal stenosis. No lytic or blastic lesions. Other neck: The nasopharynx is clear. The oropharynx and hypopharynx are normal. The epiglottis is normal. The supraglottic larynx, glottis and subglottic larynx are normal. No retropharyngeal collection. The parapharyngeal spaces are preserved. The parotid and submandibular glands are normal. No sialolithiasis or salivary ductal dilatation. The thyroid gland is normal. There is no cervical lymphadenopathy. Upper chest: No pneumothorax or pleural effusion. No nodules or masses. Review of the MIP images confirms the above findings CTA HEAD FINDINGS Anterior circulation: --Intracranial internal carotid arteries: Normal. --Anterior cerebral arteries: Normal. --Middle cerebral arteries: Normal. --Posterior communicating arteries: Present bilaterally. Posterior circulation: --Posterior cerebral arteries: Normal. --Superior cerebellar arteries: Normal. --Basilar artery: Normal. --Anterior inferior cerebellar arteries: Normal. --Posterior inferior cerebellar arteries: Normal. Venous sinuses: As permitted by contrast timing, patent. Anatomic variants: None Delayed phase: No parenchymal contrast enhancement. Review of the MIP images confirms the above findings IMPRESSION: 1. No emergent large vessel occlusion. 2. Large amount of noncalcified plaque at the proximal left internal carotid artery, causing 60-65% stenosis. This is the most likely source of emboli for the previously identified left parietal and occipital infarcts. 3. No  other intracranial or cervical arterial stenosis. 4. Minimal Aortic Atherosclerosis (ICD10-I70.0). Electronically Signed   By: Ulyses Jarred M.D.   On: 05/31/2017 21:59   Ct Angio Neck W And/or Wo Contrast  Result Date: 05/31/2017 CLINICAL DATA:  Headache and right arm weakness EXAM: CT ANGIOGRAPHY HEAD AND NECK TECHNIQUE: Multidetector CT imaging of the head and neck was performed using the standard protocol during bolus administration of intravenous  contrast. Multiplanar CT image reconstructions and MIPs were obtained to evaluate the vascular anatomy. Carotid stenosis measurements (when applicable) are obtained utilizing NASCET criteria, using the distal internal carotid diameter as the denominator. CONTRAST:  50 mL Isovue 370 COMPARISON:  None. FINDINGS: CT HEAD FINDINGS Brain: No mass lesion, intraparenchymal hemorrhage or extra-axial collection. No evidence of acute cortical infarct. Brain parenchyma and CSF-containing spaces are normal for age. Vascular: No hyperdense vessel or unexpected calcification. Skull: Normal visualized skull base, calvarium and extracranial soft tissues. Sinuses/Orbits: No sinus fluid levels or advanced mucosal thickening. No mastoid effusion. Normal orbits. Review of the MIP images confirms the above findings CTA NECK FINDINGS Aortic arch: There is no aneurysm or dissection of the visualized ascending aorta or aortic arch. Normal 3 vessel aortic branching pattern. The visualized proximal subclavian arteries are normal. Mild calcific aortic atherosclerosis. Right carotid system: The right common carotid origin is widely patent. There is no common carotid or internal carotid artery dissection or aneurysm. There is mixed calcified and noncalcified plaque at the right carotid bifurcation without hemodynamically significant stenosis. Left carotid system: There is predominantly noncalcified plaque within the proximal left internal carotid artery. This results in stenosis that measures  60% by NASCET criteria. Subjectively, the stenosis appears severe. Vertebral arteries: The vertebral system is left dominant. Both vertebral artery origins are normal. Both vertebral arteries are normal to their confluence with the basilar artery. Skeleton: There is no bony spinal canal stenosis. No lytic or blastic lesions. Other neck: The nasopharynx is clear. The oropharynx and hypopharynx are normal. The epiglottis is normal. The supraglottic larynx, glottis and subglottic larynx are normal. No retropharyngeal collection. The parapharyngeal spaces are preserved. The parotid and submandibular glands are normal. No sialolithiasis or salivary ductal dilatation. The thyroid gland is normal. There is no cervical lymphadenopathy. Upper chest: No pneumothorax or pleural effusion. No nodules or masses. Review of the MIP images confirms the above findings CTA HEAD FINDINGS Anterior circulation: --Intracranial internal carotid arteries: Normal. --Anterior cerebral arteries: Normal. --Middle cerebral arteries: Normal. --Posterior communicating arteries: Present bilaterally. Posterior circulation: --Posterior cerebral arteries: Normal. --Superior cerebellar arteries: Normal. --Basilar artery: Normal. --Anterior inferior cerebellar arteries: Normal. --Posterior inferior cerebellar arteries: Normal. Venous sinuses: As permitted by contrast timing, patent. Anatomic variants: None Delayed phase: No parenchymal contrast enhancement. Review of the MIP images confirms the above findings IMPRESSION: 1. No emergent large vessel occlusion. 2. Large amount of noncalcified plaque at the proximal left internal carotid artery, causing 60-65% stenosis. This is the most likely source of emboli for the previously identified left parietal and occipital infarcts. 3. No other intracranial or cervical arterial stenosis. 4. Minimal Aortic Atherosclerosis (ICD10-I70.0). Electronically Signed   By: Ulyses Jarred M.D.   On: 05/31/2017 21:59   Mr  Jeri Cos PO Contrast  Result Date: 05/30/2017 CLINICAL DATA:  Right arm weakness. EXAM: MRI HEAD WITHOUT AND WITH CONTRAST TECHNIQUE: Multiplanar, multiecho pulse sequences of the brain and surrounding structures were obtained without and with intravenous contrast. CONTRAST:  48mL MULTIHANCE GADOBENATE DIMEGLUMINE 529 MG/ML IV SOLN COMPARISON:  MRI head 01/30/2016 FINDINGS: Brain: Multiple small areas of acute infarction in the left parietal cortex and white matter. Small areas of acute infarct in the left occipital lobe. No other areas of acute infarct. Mild atrophy. Numerous white matter hyperintensities bilaterally compatible chronic microvascular ischemia. Basal ganglia and brainstem intact. Negative for hemorrhage or mass Normal enhancement postcontrast infusion. Vascular: Normal arterial flow voids Skull and upper cervical spine: Negative Sinuses/Orbits: Bilateral cataract removal.  Paranasal sinuses clear Other: None IMPRESSION: Multiple small areas of acute infarct in the left parietal cortex and left occipital cortex. Recommend CTA head and neck to evaluate for source of emboli or stenosis causing these infarcts Numerous chronic white matter infarcts bilaterally These results will be called to the ordering clinician or representative by the Radiologist Assistant, and communication documented in the PACS or zVision Dashboard. Electronically Signed   By: Franchot Gallo M.D.   On: 05/30/2017 15:30    Procedures Procedures (including critical care time)  Medications Ordered in ED Medications  metFORMIN (GLUCOPHAGE-XR) 24 hr tablet 1,000 mg (not administered)  metoprolol tartrate (LOPRESSOR) tablet 50 mg (50 mg Oral Given 05/31/17 2314)  diclofenac sodium (VOLTAREN) 1 % transdermal gel 2 g (not administered)  cloNIDine (CATAPRES) tablet 0.3 mg (0.3 mg Oral Given 05/31/17 2313)  pantoprazole (PROTONIX) EC tablet 40 mg (not administered)  estropipate (OGEN) tablet 0.75 mg (not administered)  insulin  glargine (LANTUS) injection 18 Units (18 Units Subcutaneous Given 05/31/17 2313)  metoprolol succinate (TOPROL-XL) 24 hr tablet 100 mg (not administered)  0.9 %  sodium chloride infusion (not administered)  acetaminophen (TYLENOL) tablet 650 mg (not administered)    Or  acetaminophen (TYLENOL) solution 650 mg (not administered)    Or  acetaminophen (TYLENOL) suppository 650 mg (not administered)  insulin aspart (novoLOG) injection 0-9 Units (not administered)  aspirin chewable tablet 324 mg (324 mg Oral Not Given 05/31/17 2314)  aspirin EC tablet 325 mg (not administered)   stroke: mapping our early stages of recovery book ( Does not apply Given 05/31/17 2313)  iopamidol (ISOVUE-370) 76 % injection (50 mLs Intravenous Contrast Given 05/31/17 2103)     Initial Impression / Assessment and Plan / ED Course  I have reviewed the triage vital signs and the nursing notes.  Pertinent labs & imaging results that were available during my care of the patient were reviewed by me and considered in my medical decision making (see chart for details).     Patient is a 66 year old female who presents with resolved right hand weakness and MRI findings of multiple small areas of acute infarction in the left parietal cortex and left occipital cortex. Patient arrived hemodynamically stable, in no acute distress. Exam as above, significant for nonfocal neurological examination.  Discussed with neurology and hospitalist for admission. CTA of the head and neck was ordered. Patient to be admitted for further stroke workup. Patient in agreement with plan at time of admission.  Patient and plan of care discussed with Attending physician, Dr. Regenia Skeeter.    Final Clinical Impressions(s) / ED Diagnoses   Final diagnoses:  Acute CVA (cerebrovascular accident) [facility])    New Prescriptions Current Discharge Medication List       Arnetha Massy, MD 06/01/17 Wyvonna Plum, MD 06/06/17 [phone]

## 2017-05-31 NOTE — ED Notes (Signed)
Dr Lindzen at the bedside 

## 2017-05-31 NOTE — ED Notes (Signed)
Family made aware of pts bed assignment 

## 2017-05-31 NOTE — ED Triage Notes (Signed)
Had MRI of head yesterday and was called today to come to er for stroke s/s , has continuing h/a and  weakness

## 2017-05-31 NOTE — Consult Note (Signed)
Referring Physician: Dr. Maudie Mercury    Chief Complaint: New stroke seen on MRI  HPI: [name] is an 66 y.o. female who initially presented to [facility] for assessment of a one week history of right grip strength weakness. MRI was obtained on 05/30/17, revealing acute and subacute infarcts in the left hemisphere (mainly in left MCA distribution; some borderzone between left MCA and left PCA), raising possibility of embolic source.  Dr. Leta Baptist recommended inpatient admission to expedite workup for embolic strokes, to include CTA of head and neck, transthoracic echocardiogram, possible TEE and implanted loop recorder, blood pressure, diabetes, lipid screening.   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   Past Medical History:  Diagnosis Date  . Anemia    takes iron supplement  . Anesthesia complication    disorientation due to pseudotumor  . Diabetes mellitus    IDDM  . Family history of anesthesia complication 57 yrs ago   brother stopped breathing for a minute or two  . GERD (gastroesophageal reflux disease)   . Headache(784.0)    due to pseudotumor; daily headache  . Hypercholesteremia    unable to tolerate statins  . Hypertension    under control with meds., has been on med. x 5 yr.  . Peripheral neuropathy   . PONV (postoperative nausea and vomiting)   . 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 Synovitis of ankle 04/2013   left  . Wears dentures    full    Past Surgical History:  Procedure Laterality Date  . ANKLE ARTHROSCOPY Left 05/18/2013   Procedure: LEFT ANKLE ARTHROSCOPY WITH DEBRIDEMENT,  SUBTALAR OPEN DEBRIDEMENT ;  Surgeon: Wylene Simmer, MD;  Location: [place];  Service: Orthopedics;  Laterality: Left;  .  APPENDECTOMY     as a child  . BALLOON DILATION N/A 07/04/2013   Procedure: BALLOON DILATION;  Surgeon: Garlan Fair, MD;  Location: [facility];  Service: Endoscopy;  Laterality: N/A;  . BLADDER SUSPENSION    . BREAST LUMPECTOMY W/ NEEDLE LOCALIZATION Left 05/22/2011  . COLONOSCOPY WITH PROPOFOL N/A 05/14/2014   Procedure: COLONOSCOPY WITH PROPOFOL;  Surgeon: Garlan Fair, MD;  Location: [facility];  Service: Endoscopy;  Laterality: N/A;  . ESOPHAGEAL DILATION  06/23/2006; 08/05/2004  . ESOPHAGOGASTRODUODENOSCOPY N/A 07/04/2013   Procedure: ESOPHAGOGASTRODUODENOSCOPY (EGD);  Surgeon: Garlan Fair, MD;  Location: [facility];  Service: Endoscopy;  Laterality: N/A;  . ESOPHAGOGASTRODUODENOSCOPY (EGD) WITH PROPOFOL N/A 05/14/2014   Procedure: ESOPHAGOGASTRODUODENOSCOPY (EGD) WITH PROPOFOL;  Surgeon: Garlan Fair, MD;  Location: [facility];  Service: Endoscopy;  Laterality: N/A;  . LUMBAR PERITONEAL SHUNT     x 2  . OVARIAN CYST REMOVAL     age 47  . SHUNT REVISION  2007  . TOTAL ABDOMINAL HYSTERECTOMY W/ BILATERAL SALPINGOOPHORECTOMY  1994   age 25s  . WRIST SURGERY Left 2014    [name]    Family History  Problem Relation Age of Onset  . Heart disease Father   . Migraines Neg Hx    Social History:  reports that she has never smoked. She has never used smokeless tobacco. She reports that she does not drink alcohol or use drugs.  Allergies:  Allergies  Allergen Reactions  .  Carbamazepine Hives, Shortness Of Breath and Other (See Comments)    Unknown hives  . Penicillins Anaphylaxis, Other (See Comments) and Swelling    unknown My throat swells Familial history of anaphylaxis  . Statins Other (See Comments)    Unknown CHEST PAIN AND LEG NUMBNESS Severe muscle weakness, leg numbness, severe headaches Numbness, joints ache CHEST PAIN AND LEG NUMBNESS  . Tricyclic Antidepressants Other (See Comments)    IMPAIRED MEMORY  . Atorvastatin Other (See Comments)  .  Ezetimibe Other (See Comments)  . Metoclopramide Other (See Comments)  . Nortriptyline Other (See Comments)    IMPAIRED MEMORY  . Other Other (See Comments)  . Penicillin G Other (See Comments)  . Quinapril Hcl Other (See Comments)  . Rifampin Other (See Comments)    Home Medications:   Current Meds  Medication Sig  . aspirin EC 81 MG tablet Take 81 mg by mouth daily.  . cloNIDine (CATAPRES) 0.3 MG tablet Take 0.3 mg by mouth 2 (two) times daily.   . diclofenac sodium (VOLTAREN) 1 % GEL Apply 2 application topically 4 (four) times daily as needed (pain).   Marland Kitchen esomeprazole (NEXIUM) 40 MG capsule Take 40 mg by mouth daily as needed (acid reflux). Acid reflux  . estropipate (OGEN) 1.5 MG tablet Take 0.75 mg by mouth daily.   . furosemide (LASIX) 20 MG tablet Take 20 mg by mouth daily.  Marland Kitchen gabapentin (NEURONTIN) 100 MG capsule Take 100 mg by mouth at bedtime.  Marland Kitchen glipiZIDE (GLUCOTROL) 10 MG tablet Take 10 mg by mouth 2 (two) times daily before a meal.   . insulin glargine (LANTUS) 100 unit/mL SOPN Inject 32 Units into the skin at bedtime.  . Melatonin 10 MG TABS Take 10 mg by mouth at bedtime as needed (sleep).  . metFORMIN (GLUCOPHAGE-XR) 500 MG 24 hr tablet Take 1,000 mg by mouth 3 (three) times daily with meals.  . metoprolol (LOPRESSOR) 50 MG tablet Take 50 mg by mouth at bedtime.   . metoprolol (TOPROL-XL) 100 MG 24 hr tablet Take 100 mg by mouth daily with breakfast.   . PRESCRIPTION MEDICATION Take 20 mg by mouth See admin instructions. Domperidone 10 mg from [place]: Take 2 tablets (20 mg) by mouth three times daily with meals - for diabetic gastroparesis  . telmisartan (MICARDIS) 80 MG tablet Take 80 mg by mouth at bedtime.     ROS: As per HPI.   Physical Examination: Blood pressure (!) 151/93, pulse 75, temperature 97.8 F (36.6 C), temperature source Oral, resp. rate 20, SpO2 99 %.  HEENT: [place]/AT Lungs: Respirations unlabored Ext: No edema  Neurologic Examination: Mental  Status: Intact to complex questions and commands. Speech fluent without evidence of aphasia.  Able to follow all commands without difficulty. Cranial Nerves: II:  Visual field constriction OU all quadrants with preserved central vision. PERRL.  III,IV, VI: ptosis not present, EOMI without nystagmus V,VII: smile symmetric, facial temp sensation normal bilaterally VIII: hearing intact to conversation IX,X: no hypophonia XI: no asymmetry XII: no lingual dysarthria Motor: RUE: 4/5 RLE: 5/5 LUE: 5/5 LLE: 4/5 Sensory: Temp and light touch intact throughout, bilaterally. No extinction. Deep Tendon Reflexes:  2+ bilateral brachioradialis 2+ right biceps, 3+ left biceps 3+ right patella, 4+ left patella 2+ achilles bilaterally Plantars: Right: downgoing   Left: downgoing Cerebellar: No ataxia with FNF bilaterally Gait: Deferred  Results for orders placed or performed during the hospital encounter of 05/31/17 (from the past 48 hour(s))  Protime-INR  Status: None   Collection Time: 05/31/17  4:27 PM  Result Value Ref Range   Prothrombin Time 13.1 11.4 - 15.2 seconds   INR 0.99   APTT     Status: None   Collection Time: 05/31/17  4:27 PM  Result Value Ref Range   aPTT 25 24 - 36 seconds  CBC     Status: Abnormal   Collection Time: 05/31/17  4:27 PM  Result Value Ref Range   WBC 11.1 (H) 4.0 - 10.5 K/uL   RBC 4.30 3.87 - 5.11 MIL/uL   Hemoglobin 11.9 (L) 12.0 - 15.0 g/dL   HCT 36.6 36.0 - 46.0 %   MCV 85.1 78.0 - 100.0 fL   MCH 27.7 26.0 - 34.0 pg   MCHC 32.5 30.0 - 36.0 g/dL   RDW 14.1 11.5 - 15.5 %   Platelets 346 150 - 400 K/uL  Differential     Status: Abnormal   Collection Time: 05/31/17  4:27 PM  Result Value Ref Range   Neutrophils Relative % 75 %   Neutro Abs 8.3 (H) 1.7 - 7.7 K/uL   Lymphocytes Relative 19 %   Lymphs Abs 2.1 0.7 - 4.0 K/uL   Monocytes Relative 5 %   Monocytes Absolute 0.6 0.1 - 1.0 K/uL   Eosinophils Relative 1 %   Eosinophils Absolute 0.2 0.0 -  0.7 K/uL   Basophils Relative 0 %   Basophils Absolute 0.0 0.0 - 0.1 K/uL  Comprehensive metabolic panel     Status: Abnormal   Collection Time: 05/31/17  4:27 PM  Result Value Ref Range   Sodium 133 (L) 135 - 145 mmol/L   Potassium 4.5 3.5 - 5.1 mmol/L   Chloride 101 101 - 111 mmol/L   CO2 23 22 - 32 mmol/L   Glucose, Bld 297 (H) 65 - 99 mg/dL   BUN 21 (H) 6 - 20 mg/dL   Creatinine, Ser 1.18 (H) 0.44 - 1.00 mg/dL   Calcium 9.4 8.9 - 10.3 mg/dL   Total Protein 7.4 6.5 - 8.1 g/dL   Albumin 3.8 3.5 - 5.0 g/dL   AST 43 (H) 15 - 41 U/L   ALT 26 14 - 54 U/L   Alkaline Phosphatase 159 (H) 38 - 126 U/L   Total Bilirubin 0.4 0.3 - 1.2 mg/dL   GFR calc non Af Amer 47 (L) >60 mL/min   GFR calc Af Amer 55 (L) >60 mL/min    Comment: (NOTE) The eGFR has been calculated using the CKD EPI equation. This calculation has not been validated in all clinical situations. eGFR's persistently <60 mL/min signify possible Chronic Kidney Disease.    Anion gap 9 5 - 15  I-stat troponin, ED     Status: None   Collection Time: 05/31/17  4:46 PM  Result Value Ref Range   Troponin i, poc 0.00 0.00 - 0.08 ng/mL   Comment 3            Comment: Due to the release kinetics of cTnI, a negative result within the first hours of the onset of symptoms does not rule out myocardial infarction with certainty. If myocardial infarction is still suspected, repeat the test at appropriate intervals.   I-Stat Chem 8, ED     Status: Abnormal   Collection Time: 05/31/17  4:47 PM  Result Value Ref Range   Sodium 134 (L) 135 - 145 mmol/L   Potassium 4.5 3.5 - 5.1 mmol/L   Chloride 102 101 - 111 mmol/L  BUN 25 (H) 6 - 20 mg/dL   Creatinine, Ser 1.20 (H) 0.44 - 1.00 mg/dL   Glucose, Bld 306 (H) 65 - 99 mg/dL   Calcium, Ion 1.07 (L) 1.15 - 1.40 mmol/L   TCO2 23 0 - 100 mmol/L   Hemoglobin 12.9 12.0 - 15.0 g/dL   HCT 38.0 36.0 - 46.0 %  CBG monitoring, ED     Status: Abnormal   Collection Time: 05/31/17  7:24 PM   Result Value Ref Range   Glucose-Capillary 160 (H) 65 - 99 mg/dL   Mr Jeri Cos Wo Contrast  Result Date: 05/30/2017 CLINICAL DATA:  Right arm weakness. EXAM: MRI HEAD WITHOUT AND WITH CONTRAST TECHNIQUE: Multiplanar, multiecho pulse sequences of the brain and surrounding structures were obtained without and with intravenous contrast. CONTRAST:  75m MULTIHANCE GADOBENATE DIMEGLUMINE 529 MG/ML IV SOLN COMPARISON:  MRI head 01/30/2016 FINDINGS: Brain: Multiple small areas of acute infarction in the left parietal cortex and white matter. Small areas of acute infarct in the left occipital lobe. No other areas of acute infarct. Mild atrophy. Numerous white matter hyperintensities bilaterally compatible chronic microvascular ischemia. Basal ganglia and brainstem intact. Negative for hemorrhage or mass Normal enhancement postcontrast infusion. Vascular: Normal arterial flow voids Skull and upper cervical spine: Negative Sinuses/Orbits: Bilateral cataract removal.  Paranasal sinuses clear Other: None IMPRESSION: Multiple small areas of acute infarct in the left parietal cortex and left occipital cortex. Recommend CTA head and neck to evaluate for source of emboli or stenosis causing these infarcts Numerous chronic white matter infarcts bilaterally These results will be called to the ordering clinician or representative by the Radiologist Assistant, and communication documented in the PACS or zVision Dashboard. Electronically Signed   By: CFranchot GalloM.D.   On: 05/30/2017 15:30    Assessment: 66y.o. female with multiple small areas of acute infarct in the left parietal cortex and left occipital cortex 1. DDx for underlying etiology includes cardioembolic and artery-to-artery embolization 2. Classifiable as having failed ASA monotherapy 3. History of pseudotumor cerebri. On acetazolamide and has lumboperitoneal shunt.  4. Allergic to statins 5. Stroke Risk Factors - hypercholesterolemia, HTN  Plan: 1.  HgbA1c, fasting lipid panel 2. MRA  of the brain without contrast 3. PT consult, OT consult, Speech consult 4. Echocardiogram 5. Carotid dopplers 6. Switch ASA to Plavix 7. Risk factor modification discussed with patient.  8. Telemetry monitoring 9. Frequent neuro checks 10. BP management  _0  signed: Dr. EKerney Elbe 05/31/2017, 8:15 PM

## 2017-05-31 NOTE — Telephone Encounter (Signed)
[name]/Dr Felipa Eth [phone] her paged, (she request not to leave a message)called the clinic- pt had MRI yesterday it showed CVA. Dr Felipa Eth is thinking it happened last week. He is wanting her to be seen asap. [name] is aware Dr Jaynee Eagles is out of the office for 2 weeks. Please call to discuss and appt time

## 2017-05-31 NOTE — H&P (Signed)
TRH H&P   Patient Demographics:    [name], is a 66 y.o. female  MRN: [id number]   DOB - [birth year]  Admit Date - 05/31/2017  Outpatient Primary MD for the patient is [name], MD  Referring MD/NP/PA:  Sherwood Gambler  Outpatient Specialists:  Sarina Ill (neurology) Ila Mcgill (podiatry) Earle Gell (GI) Donnie Mesa (surgery)  Patient coming from:   Chief Complaint  Patient presents with  . Headache  . Cerebrovascular Accident      HPI:    [name]  is a 66 y.o. female, w hypertension, hyperlipidemia, Dm2, w neuropathy, Seizure, Pseudotumor Cerebri apparently c/o fall last Tuesday and on Wednesday had some headache and then on Thursday had  weakness of the right hand, and numbness of the right hand pt has difficulty seeing out of the right eye.  Pt was seen in the office on Friday and had MRI brain 8/13=> Multiple small areas of acute infarct in the left parietal cortex and left occipital cortex. Recommend CTA head and neck to evaluate for source of emboli or stenosis causing these infarcts   Numerous chronic white matter infarcts bilaterally  Pt was told to come to ED, to have CVA w/up.  Na 133, Bun/creatinine 21/1.18,  Glucose 297.  Hgb 11.9 ED contacted neurology for evaluation.  Pt not a TPA candidate. Pt will be admitted for CVA w/up.    Review of systems:    In addition to the HPI above,  No Fever-chills, No changes with Vision or hearing, No problems swallowing food or Liquids, No Chest pain, Cough or Shortness of Breath, No Abdominal pain, No Nausea or Vommitting, Bowel movements are regular, No Blood in stool or Urine, No dysuria, No new skin rashes or bruises, No new joints pains-aches,   No recent weight gain or loss, No polyuria, polydypsia or polyphagia, No significant Mental Stressors.  A full 10 point Review of  Systems was done, except as stated above, all other Review of Systems were negative.   With Past History of the following :    Past Medical History:  Diagnosis Date  . Anemia    takes iron supplement  . Anesthesia complication    disorientation due to pseudotumor  . Diabetes mellitus    IDDM  . Family history of anesthesia complication 93 yrs ago   brother stopped breathing for a minute or two  . GERD (gastroesophageal reflux disease)   . Headache(784.0)    due to pseudotumor; daily headache  . Hypercholesteremia    unable to tolerate statins  . Hypertension    under control with meds., has been on med. x 5 yr.  . Peripheral neuropathy   . PONV (postoperative nausea and vomiting)   . 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  Synovitis of ankle 04/2013   left  . Wears dentures    full      Past Surgical History:  Procedure Laterality Date  . ANKLE ARTHROSCOPY Left 05/18/2013   Procedure: LEFT ANKLE ARTHROSCOPY WITH DEBRIDEMENT,  SUBTALAR OPEN DEBRIDEMENT ;  Surgeon: Wylene Simmer, MD;  Location: [place];  Service: Orthopedics;  Laterality: Left;  . APPENDECTOMY     as a child  . BALLOON DILATION N/A 07/04/2013   Procedure: BALLOON DILATION;  Surgeon: Garlan Fair, MD;  Location: [facility];  Service: Endoscopy;  Laterality: N/A;  . BLADDER SUSPENSION    . BREAST LUMPECTOMY W/ NEEDLE LOCALIZATION Left 05/22/2011  . COLONOSCOPY WITH PROPOFOL N/A 05/14/2014   Procedure: COLONOSCOPY WITH PROPOFOL;  Surgeon: Garlan Fair, MD;  Location: [facility];  Service: Endoscopy;  Laterality: N/A;  . ESOPHAGEAL DILATION  06/23/2006; 08/05/2004  . ESOPHAGOGASTRODUODENOSCOPY N/A 07/04/2013   Procedure: ESOPHAGOGASTRODUODENOSCOPY (EGD);  Surgeon: Garlan Fair, MD;  Location: [facility];  Service: Endoscopy;  Laterality: N/A;  . ESOPHAGOGASTRODUODENOSCOPY (EGD) WITH PROPOFOL N/A 05/14/2014    Procedure: ESOPHAGOGASTRODUODENOSCOPY (EGD) WITH PROPOFOL;  Surgeon: Garlan Fair, MD;  Location: [facility];  Service: Endoscopy;  Laterality: N/A;  . LUMBAR PERITONEAL SHUNT     x 2  . OVARIAN CYST REMOVAL     age 73  . SHUNT REVISION  2007  . TOTAL ABDOMINAL HYSTERECTOMY W/ BILATERAL SALPINGOOPHORECTOMY  1994   age 11s  . WRIST SURGERY Left 2014    [name]      Social History:     Social History  Substance Use Topics  . Smoking status: Never Smoker  . Smokeless tobacco: Never Used  . Alcohol use No     Lives - at home  Mobility - walks by self, sometimes w walker   Family History :     Family History  Problem Relation Age of Onset  . Heart disease Father   . Migraines Neg Hx    No family hx of CVA   Home Medications:   Prior to Admission medications   Medication Sig Start Date End Date Taking? Authorizing Provider  acetaZOLAMIDE (DIAMOX) 250 MG tablet Take 1 tablet (250 mg total) by mouth 3 (three) times daily. 01/15/16   Melvenia Beam, MD  cloNIDine (CATAPRES) 0.3 MG tablet Take 0.3 mg by mouth 2 (two) times daily.     [provider]  diclofenac sodium (VOLTAREN) 1 % GEL Apply 2 g topically 4 (four) times daily as needed (pain).     [provider]  doxycycline (VIBRA-TABS) 100 MG tablet Take 1 tablet (100 mg total) by mouth 2 (two) times daily. 08/04/16   Landis Martins, DPM  esomeprazole (NEXIUM) 40 MG capsule Take 40 mg by mouth daily as needed (acid reflux). Acid reflux    [provider]  estropipate (OGEN) 1.5 MG tablet Take 0.75 mg by mouth daily.     [provider]  fluconazole (DIFLUCAN) 150 MG tablet Take 1 tablet (150 mg total) by mouth once. 12/04/15   Wallene Huh, DPM  furosemide (LASIX) 20 MG tablet Take 20 mg by mouth daily.    [provider]  glipiZIDE (GLUCOTROL) 10 MG tablet Take 10 mg by mouth 2 (two) times daily before a meal.     [provider]  insulin glargine (LANTUS)  100 UNIT/ML injection Inject 18 Units into the skin at bedtime.     [provider]  metFORMIN Marcial Pacas)  500 MG (MOD) 24 hr tablet Take 1,000 mg by mouth 2 (two) times daily with a meal.    [provider]  metoprolol (LOPRESSOR) 50 MG tablet Take 50 mg by mouth every evening.    [provider]  metoprolol (TOPROL-XL) 100 MG 24 hr tablet Take 100 mg by mouth daily.     [provider]  PRESCRIPTION MEDICATION Take 20 mg by mouth 3 (three) times daily.    [provider]  telmisartan (MICARDIS) 80 MG tablet Take 80 mg by mouth every evening.     [provider]     Allergies:     Allergies  Allergen Reactions  . Carbamazepine Hives, Shortness Of Breath and Other (See Comments)    Unknown hives  . Penicillins Anaphylaxis, Other (See Comments) and Swelling    unknown My throat swells Familial history of anaphylaxis  . Statins Other (See Comments)    Unknown CHEST PAIN AND LEG NUMBNESS Severe muscle weakness, leg numbness, severe headaches Numbness, joints ache CHEST PAIN AND LEG NUMBNESS  . Tricyclic Antidepressants Other (See Comments)    IMPAIRED MEMORY  . Atorvastatin Other (See Comments)  . Ezetimibe Other (See Comments)  . Metoclopramide Other (See Comments)  . Nortriptyline Other (See Comments)    IMPAIRED MEMORY  . Other Other (See Comments)  . Penicillin G Other (See Comments)  . Quinapril Hcl Other (See Comments)  . Rifampin Other (See Comments)     Physical Exam:   Vitals  Blood pressure (!) 151/93, pulse 75, temperature 97.8 F (36.6 C), temperature source Oral, resp. rate 20, SpO2 99 %.   1. General  lying in bed in NAD,    2. Normal affect and insight, Not Suicidal or Homicidal, Awake Alert, Oriented X 3.  3. No F.N deficits, ALL C.Nerves Intact, Strength 5/5 all 4 extremities, Sensation intact all 4 extremities, Plantars down going.  4. Ears and Eyes appear Normal, Conjunctivae clear, PERRLA. Moist  Oral Mucosa.  5. Supple Neck, No JVD, No cervical lymphadenopathy appriciated, No Carotid Bruits.  6. Symmetrical Chest wall movement, Good air movement bilaterally, CTAB.  7. RRR, No Gallops, Rubs or Murmurs, No Parasternal Heave.  8. Positive Bowel Sounds, Abdomen Soft, No tenderness, No organomegaly appriciated,No rebound -guarding or rigidity.  9.  No Cyanosis, Normal Skin Turgor, No Skin Rash or Bruise.  10. Good muscle tone,  joints appear normal , no effusions, Normal ROM.  11. No Palpable Lymph Nodes in Neck or Axillae     Data Review:    CBC  Recent Labs Lab 05/31/17 1627 05/31/17 1647  WBC 11.1*  --   HGB 11.9* 12.9  HCT 36.6 38.0  PLT 346  --   MCV 85.1  --   MCH 27.7  --   MCHC 32.5  --   RDW 14.1  --   LYMPHSABS 2.1  --   MONOABS 0.6  --   EOSABS 0.2  --   BASOSABS 0.0  --    ------------------------------------------------------------------------------------------------------------------  Chemistries   Recent Labs Lab 05/31/17 1627 05/31/17 1647  NA 133* 134*  K 4.5 4.5  CL 101 102  CO2 23  --   GLUCOSE 297* 306*  BUN 21* 25*  CREATININE 1.18* 1.20*  CALCIUM 9.4  --   AST 43*  --   ALT 26  --   ALKPHOS 159*  --   BILITOT 0.4  --    ------------------------------------------------------------------------------------------------------------------ CrCl cannot be calculated (Unknown ideal weight.). ------------------------------------------------------------------------------------------------------------------ No  results for input(s): TSH, T4TOTAL, T3FREE, THYROIDAB in the last 72 hours.  Invalid input(s): FREET3  Coagulation profile  Recent Labs Lab 05/31/17 1627  INR 0.99   ------------------------------------------------------------------------------------------------------------------- No results for input(s): DDIMER in the last 72  hours. -------------------------------------------------------------------------------------------------------------------  Cardiac Enzymes No results for input(s): CKMB, TROPONINI, MYOGLOBIN in the last 168 hours.  Invalid input(s): CK ------------------------------------------------------------------------------------------------------------------ No results found for: BNP   ---------------------------------------------------------------------------------------------------------------  Urinalysis No results found for: COLORURINE, APPEARANCEUR, [place], [place], GLUCOSEU, [place], BILIRUBINUR, KETONESUR, PROTEINUR, UROBILINOGEN, NITRITE, LEUKOCYTESUR  ----------------------------------------------------------------------------------------------------------------   Imaging Results:    Mr Jeri Cos Wo Contrast  Result Date: 05/30/2017 CLINICAL DATA:  Right arm weakness. EXAM: MRI HEAD WITHOUT AND WITH CONTRAST TECHNIQUE: Multiplanar, multiecho pulse sequences of the brain and surrounding structures were obtained without and with intravenous contrast. CONTRAST:  12m MULTIHANCE GADOBENATE DIMEGLUMINE 529 MG/ML IV SOLN COMPARISON:  MRI head 01/30/2016 FINDINGS: Brain: Multiple small areas of acute infarction in the left parietal cortex and white matter. Small areas of acute infarct in the left occipital lobe. No other areas of acute infarct. Mild atrophy. Numerous white matter hyperintensities bilaterally compatible chronic microvascular ischemia. Basal ganglia and brainstem intact. Negative for hemorrhage or mass Normal enhancement postcontrast infusion. Vascular: Normal arterial flow voids Skull and upper cervical spine: Negative Sinuses/Orbits: Bilateral cataract removal.  Paranasal sinuses clear Other: None IMPRESSION: Multiple small areas of acute infarct in the left parietal cortex and left occipital cortex. Recommend CTA head and neck to evaluate for source of emboli or stenosis causing these  infarcts Numerous chronic white matter infarcts bilaterally These results will be called to the ordering clinician or representative by the Radiologist Assistant, and communication documented in the PACS or zVision Dashboard. Electronically Signed   By: CFranchot GalloM.D.   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  Pt allerghic to statins Check lipid Consider Zetia or Welchol, or newer agent such as Praluent PT/OT , speech to evaluate and tx Neurology consulted, appreciate input May need TEE, defer to neurology  Dm2 fsbs q4h Check hga1c Hold glipizide Cont levemir, metformin If hga1c uncontrolled, consider Victoza which has shown reduction in cardiovascular risk  Hypertension Allow permissive hypertension HOLD Telmisartan, HOLD Lasix Continue metoprolol  Abnormal lft (elevation in alkaline phosphatase) Check GGT  Hyponatremia (mild) Repeat cmp in am  Anemia (Mild) Repeat cbc in am   DVT Prophylaxis SCDs   AM Labs Ordered, also please review Full Orders  Family Communication: Admission, patients condition and plan of care including tests being ordered have been discussed with the patient  who indicate understanding and agree with the plan and Code Status.  Code Status FULL CODE  Likely DC to  home  Condition GUARDED    Consults called: neurology by ED  Admission status: inpatient  Time spent in minutes : 45    JJani GravelM.D on 05/31/2017 at 7:47 PM  Between 7am to 7pm - Pager - [phone] After 7pm go to [email or web address] - password [email or web address] - Office  [phone]

## 2017-05-31 NOTE — Telephone Encounter (Signed)
I spoke with Dr. Felipa Eth. Patient has had approximately one week of right grip strength weakness. MRI from 05/30/17 was reviewed which shows acute and subacute infarcts in the left hemisphere (mainly in left MCA distribution; some borderzone between left MCA and left PCA), raising possibility of embolic source.  I recommended inpatient admission to expedite workup for embolic strokes. This could include testing such as CTA of head and neck, transthoracic echocardiogram, possible TEE and implanted loop recorder, blood pressure, diabetes, lipid screening. While this workup could be considered on on outpatient basis, I feel that this could delay diagnosis and place patient at risk for further strokes. Dr. Felipa Eth will discuss with patient and send to the hospital for stroke admission.    Penni Bombard, MD 02/21/1832, 5:82 PM Certified in Neurology, Neurophysiology and Neuroimaging  [facility] [address] [place], [place] [address] [phone]

## 2017-06-01 ENCOUNTER — Encounter (HOSPITAL_COMMUNITY): Payer: Self-pay

## 2017-06-01 ENCOUNTER — Encounter (HOSPITAL_COMMUNITY): Payer: Medicare Other

## 2017-06-01 ENCOUNTER — Inpatient Hospital Stay (HOSPITAL_COMMUNITY): Payer: Medicare Other

## 2017-06-01 DIAGNOSIS — I5032 Chronic diastolic (congestive) heart failure: Secondary | ICD-10-CM

## 2017-06-01 DIAGNOSIS — I63232 Cerebral infarction due to unspecified occlusion or stenosis of left carotid arteries: Secondary | ICD-10-CM

## 2017-06-01 DIAGNOSIS — I639 Cerebral infarction, unspecified: Secondary | ICD-10-CM

## 2017-06-01 DIAGNOSIS — I6522 Occlusion and stenosis of left carotid artery: Secondary | ICD-10-CM

## 2017-06-01 LAB — ECHOCARDIOGRAM COMPLETE
HEIGHTINCHES: 65 in
Weight: 2584 oz

## 2017-06-01 LAB — VAS US CAROTID
LCCADDIAS: -27 cm/s
LCCAPSYS: 131 cm/s
LEFT VERTEBRAL DIAS: 19 cm/s
LICADDIAS: -26 cm/s
LICADSYS: -93 cm/s
Left CCA dist sys: -93 cm/s
Left CCA prox dias: 26 cm/s
Left ICA prox dias: -48 cm/s
Left ICA prox sys: -159 cm/s
RCCADSYS: -65 cm/s
RCCAPSYS: 67 cm/s
RIGHT ECA DIAS: -9 cm/s
RIGHT VERTEBRAL DIAS: 19 cm/s
Right CCA prox dias: 14 cm/s

## 2017-06-01 LAB — GLUCOSE, CAPILLARY
GLUCOSE-CAPILLARY: 328 mg/dL — AB (ref 65–99)
GLUCOSE-CAPILLARY: 222 mg/dL — AB (ref 65–99)
Glucose-Capillary: 112 mg/dL — ABNORMAL HIGH (ref 65–99)
Glucose-Capillary: 134 mg/dL — ABNORMAL HIGH (ref 65–99)
Glucose-Capillary: 162 mg/dL — ABNORMAL HIGH (ref 65–99)

## 2017-06-01 MED ORDER — CLOPIDOGREL BISULFATE 75 MG PO TABS
75.0000 mg | ORAL_TABLET | Freq: Every day | ORAL | Status: DC
  Administered 2017-06-02: 75 mg via ORAL
  Filled 2017-06-01: qty 1

## 2017-06-01 MED ORDER — GABAPENTIN 100 MG PO CAPS
100.0000 mg | ORAL_CAPSULE | Freq: Every day | ORAL | Status: DC
  Administered 2017-06-01 – 2017-06-02 (×2): 100 mg via ORAL
  Filled 2017-06-01 (×2): qty 1

## 2017-06-01 MED ORDER — NONFORMULARY OR COMPOUNDED ITEM: 20.0000 mg | Freq: Three times a day (TID) | Status: DC

## 2017-06-01 MED ORDER — INSULIN ASPART 100 UNIT/ML ~~LOC~~ SOLN
0.0000 [IU] | Freq: Three times a day (TID) | SUBCUTANEOUS | Status: DC
  Administered 2017-06-01: 2 [IU] via SUBCUTANEOUS
  Administered 2017-06-02: 3 [IU] via SUBCUTANEOUS
  Administered 2017-06-02: 5 [IU] via SUBCUTANEOUS
  Administered 2017-06-02 – 2017-06-03 (×3): 3 [IU] via SUBCUTANEOUS

## 2017-06-01 MED ORDER — INSULIN ASPART 100 UNIT/ML ~~LOC~~ SOLN: 0.0000 [IU] | Freq: Every day | SUBCUTANEOUS | Status: DC

## 2017-06-01 MED ORDER — PERFLUTREN LIPID MICROSPHERE
1.0000 mL | INTRAVENOUS | Status: AC | PRN
  Administered 2017-06-01: 2 mL via INTRAVENOUS
  Filled 2017-06-01: qty 10

## 2017-06-01 MED ORDER — INSULIN GLARGINE 100 UNIT/ML ~~LOC~~ SOLN
32.0000 [IU] | Freq: Every day | SUBCUTANEOUS | Status: DC
  Administered 2017-06-01 – 2017-06-03 (×3): 32 [IU] via SUBCUTANEOUS
  Filled 2017-06-01 (×4): qty 0.32

## 2017-06-01 NOTE — Progress Notes (Signed)
Pt refused blood draw all day today saying that nobody has explained the reasons or necessity for all the blood draw and that the blood already drawn from the ED and her Doctor;s office should be able to determine whatever was needed. She however agreed the doctors (Neurologist, Vascular Surgery and the hospitalist) all came to see her but none of them explained the reasons for all the blood draw. Pt was however reassured, will continue to monitor. Obasogie-Asidi, Sajid Ruppert Efe

## 2017-06-01 NOTE — Evaluation (Signed)
Physical Therapy Evaluation Patient Details Name: [name] MRN: [id number] DOB: 06/12/1951 Today's Date: 06/01/2017   History of Present Illness  66 y.o. female who initially presented to [facility] for assessment of a one week history of right grip strength weakness. MRI was obtained on 05/30/17, revealing acute and subacute infarcts in the left hemisphere (mainly in left MCA distribution; some borderzone between left MCA and left PCA), raising possibility of embolic source; Pt was told to come to ED, to have CVA workup   Clinical Impression  Orders received for PT evaluation. Patient demonstrates deficits in functional mobility as indicated below. Will benefit from continued skilled PT to address deficits and maximize function. Will see as indicated and progress as tolerated.  Educated patient on BEFAST stroke signs.    Follow Up Recommendations No PT follow up;Supervision for mobility/OOB    Equipment Recommendations  None recommended by PT    Recommendations for Other Services       Precautions / Restrictions Precautions Precautions: Fall Restrictions Weight Bearing Restrictions: No      Mobility  Bed Mobility Overal bed mobility: Needs Assistance Bed Mobility: Supine to Sit;Sit to Supine     Supine to sit: Supervision;HOB elevated Sit to supine: Supervision;HOB elevated   General bed mobility comments: supervision for safety   Transfers Overall transfer level: Needs assistance Equipment used: None Transfers: Sit to/from Stand Sit to Stand: Supervision         General transfer comment: min guard for safety  Ambulation/Gait Ambulation/Gait assistance: Supervision Ambulation Distance (Feet): 210 Feet Assistive device: None Gait Pattern/deviations: Step-through pattern;Decreased stride length;Drifts right/left;Narrow base of support Gait velocity: decreased Gait velocity interpretation: Below normal speed for age/gender General  Gait Details: some modest instability noted, patient reports baseline instability with movements to the right  Stairs            Wheelchair Mobility    Modified Rankin (Stroke Patients Only) Modified Rankin (Stroke Patients Only) Pre-Morbid Rankin Score: No symptoms Modified Rankin: Moderate disability     Balance Overall balance assessment: Needs assistance Sitting-balance support: Feet supported;No upper extremity supported Sitting balance-Leahy Scale: Good     Standing balance support: No upper extremity supported;During functional activity Standing balance-Leahy Scale: Fair Standing balance comment: able to static stand without physcial assist             High level balance activites: Backward walking;Direction changes;Turns;Head turns High Level Balance Comments: min guard for safety and stability, no physical assist required             Pertinent Vitals/Pain Pain Assessment: Faces Faces Pain Scale: Hurts a little bit Pain Location: headache Pain Descriptors / Indicators: Discomfort Pain Intervention(s): Monitored during session;Repositioned    Home Living Family/patient expects to be discharged to:: Private residence Living Arrangements: Children;Spouse/significant other Available Help at Discharge: Available 24 hours/day (children work intermittently, spouse is home 24 hrs) Type of Home: House Home Access: Stairs to enter Entrance Stairs-Rails: Right;Left;Can reach both Technical brewer of Steps: 4 Home Layout: One level Home Equipment: Grab bars - tub/shower;Grab bars - toilet;Shower seat;Cane - single point;Walker - 4 wheels      Prior Function Level of Independence: Independent with assistive device(s);Needs assistance   Gait / Transfers Assistance Needed: occasionally uses rollator   ADL's / Homemaking Assistance Needed: Pt reports she completes ADLs independently approx 90-95% of the time; intermittently needs assistance/supervision  for bathing/dressing ADLs        Hand Dominance   Dominant Hand: Right  Extremity/Trunk Assessment   Upper Extremity Assessment Upper Extremity Assessment: Defer to OT evaluation    Lower Extremity Assessment Lower Extremity Assessment: Generalized weakness       Communication   Communication: No difficulties  Cognition Arousal/Alertness: Awake/alert Behavior During Therapy: WFL for tasks assessed/performed Overall Cognitive Status: Within Functional Limits for tasks assessed                                        General Comments      Exercises     Assessment/Plan    PT Assessment Patient needs continued PT services  PT Problem List Decreased activity tolerance;Decreased balance;Decreased mobility       PT Treatment Interventions DME instruction;Gait training;Stair training;Functional mobility training;Therapeutic activities;Therapeutic exercise;Balance training;Patient/family education    PT Goals (Current goals can be found in the Care Plan section)  Acute Rehab PT Goals Patient Stated Goal: return home  PT Goal Formulation: With patient Time For Goal Achievement: 06/15/17 Potential to Achieve Goals: Good    Frequency Min 3X/week   Barriers to discharge        Co-evaluation               AM-PAC PT "6 Clicks" Daily Activity  Outcome Measure Difficulty turning over in bed (including adjusting bedclothes, sheets and blankets)?: A Little Difficulty moving from lying on back to sitting on the side of the bed? : A Little Difficulty sitting down on and standing up from a chair with arms (e.g., wheelchair, bedside commode, etc,.)?: A Little Help needed moving to and from a bed to chair (including a wheelchair)?: A Little Help needed walking in hospital room?: A Little Help needed climbing 3-5 steps with a railing? : A Little 6 Click Score: 18    End of Session Equipment Utilized During Treatment: Gait belt Activity Tolerance:  Patient tolerated treatment well Patient left: in bed;with call bell/phone within reach;with bed alarm set Nurse Communication: Mobility status PT Visit Diagnosis: Difficulty in walking, not elsewhere classified (R26.2);Unsteadiness on feet (R26.81)    Time: 8828-0034 PT Time Calculation (min) (ACUTE ONLY): 21 min   Charges:   PT Evaluation $PT Eval Moderate Complexity: 1 Mod     PT G Codes:        Alben Deeds, PT DPT  Board Certified Neurologic Specialist [place] 06/01/2017, 3:24 PM

## 2017-06-01 NOTE — Progress Notes (Signed)
Inpatient Diabetes Program Recommendations  AACE/ADA: New Consensus Statement on Inpatient Glycemic Control (2015)  Target Ranges:  Prepandial:   less than 140 mg/dL      Peak postprandial:   less than 180 mg/dL (1-2 hours)      Critically ill patients:  140 - 180 mg/dL    Review of Glycemic Control  Diabetes history: DM 2 Outpatient Diabetes medications: Lantus 32 units, Metformin 1000 mg tid, Glipizide 10 mg BID Current orders for Inpatient glycemic control: Lantus 18 units, Novolog Sensitive 0-9 units tid  Inpatient Diabetes Program Recommendations:    Fasting glucose 328 mg/dl on reduced dose of home Lantus. Please consider increasing Lantus to 25 units (80% of home dose).   Thanks,  Tama Headings RN, MSN, [facility] Inpatient Diabetes Coordinator Team Pager [phone] (8a-5p)

## 2017-06-01 NOTE — Progress Notes (Signed)
*  PRELIMINARY RESULTS* Vascular Ultrasound Carotid Duplex (Doppler) has been completed.   Findings suggest 1-39% right internal carotid artery stenosis and 40-59% left internal carotid artery stenosis. Vertebral arteries are patent with antegrade flow.  06/01/2017 12:38 PM Maudry Mayhew, BS, RVT, RDCS, RDMS

## 2017-06-01 NOTE — Progress Notes (Signed)
Patient refused to have addition lab work done this morning. Lab attempted 2 times to get patients labs drawn.  RN educated patient on the importance of having lab work completed - patient still refused.

## 2017-06-01 NOTE — Progress Notes (Signed)
  Echocardiogram 2D Echocardiogram with Definity has been performed.  Tresa Res 06/01/2017, 1:49 PM

## 2017-06-01 NOTE — Telephone Encounter (Signed)
Patient is currently in hospital for stroke work up per Dr. Leta Baptist advice.

## 2017-06-01 NOTE — Progress Notes (Signed)
Patient is refusing lab draws. Pt was educated on the importance of the lab draws and continue to refuse.

## 2017-06-01 NOTE — Progress Notes (Signed)
RN called to patient's room, patient stated that she felt that she was light headed, right arm weak, cold, and cant move it and hard to breath.  Vital signs assessed and stable   Patient advised RN that symptoms have resolved and that she feels fine now.  RN will continue to monitor patient

## 2017-06-01 NOTE — Evaluation (Signed)
Occupational Therapy Evaluation Patient Details Name: [name] MRN: [id number] DOB: [birth year] Today's Date: 06/01/2017    History of Present Illness 66 y.o. female who initially presented to [facility] for assessment of a one week history of right grip strength weakness. MRI was obtained on 05/30/17, revealing acute and subacute infarcts in the left hemisphere (mainly in left MCA distribution; some borderzone between left MCA and left PCA), raising possibility of embolic source; Pt was told to come to ED, to have CVA workup    Clinical Impression   This 66 y/o F presents with the above. At baseline Pt is mod independent with ADLs and functional mobility, reporting she intermittently uses rollator and occasionally needs assist from family for ADL completion. Pt currently requires MinA for room level functional mobility without device, MinA for LB ADLs. Pt with deficits in RUE fine/gross motor movements. Pt will benefit from continued OT services to maximize RUE functional use, safety, and independence with ADLs and functional mobility.     Follow Up Recommendations  Outpatient OT;Supervision - Intermittent    Equipment Recommendations  None recommended by OT           Precautions / Restrictions Restrictions Weight Bearing Restrictions: No      Mobility Bed Mobility Overal bed mobility: Needs Assistance Bed Mobility: Supine to Sit;Sit to Supine     Supine to sit: Supervision;HOB elevated Sit to supine: Supervision;HOB elevated   General bed mobility comments: supervision for safety   Transfers Overall transfer level: Needs assistance Equipment used: None Transfers: Sit to/from Stand Sit to Stand: Min guard;Min assist         General transfer comment: close guard for safety to stand from EOB, MinA from toilet     Balance Overall balance assessment: Needs assistance Sitting-balance support: Feet supported;No upper extremity  supported Sitting balance-Leahy Scale: Good     Standing balance support: No upper extremity supported;During functional activity Standing balance-Leahy Scale: Fair Standing balance comment: washing hands standing at sink                            ADL either performed or assessed with clinical judgement   ADL Overall ADL's : Needs assistance/impaired Eating/Feeding: Set up;Sitting   Grooming: Wash/dry hands;Min guard;Standing   Upper Body Bathing: Min guard;Sitting   Lower Body Bathing: Min guard;Sit to/from stand   Upper Body Dressing : Min guard;Sitting   Lower Body Dressing: Minimal assistance;Sit to/from stand   Toilet Transfer: Ambulation;Regular Toilet;Grab bars;Minimal assistance   Toileting- Clothing Manipulation and Hygiene: Sit to/from stand;Min guard       Functional mobility during ADLs: Minimal assistance       Vision Baseline Vision/History: Wears glasses;Cataracts (at baseline has impaired peripheral vision, reports intermittent blurred vision with baseline Pseudotumor Cerebri) Wears Glasses: Reading only Patient Visual Report: No change from baseline Vision Assessment?: Yes Eye Alignment: Within Functional Limits Ocular Range of Motion: Within Functional Limits Alignment/Gaze Preference: Within Defined Limits Tracking/Visual Pursuits: Able to track stimulus in all quads without difficulty                Pertinent Vitals/Pain Pain Assessment: Faces Faces Pain Scale: Hurts a little bit Pain Location: "general discomfort"  Pain Descriptors / Indicators: Discomfort Pain Intervention(s): Monitored during session;Repositioned     Hand Dominance Right   Extremity/Trunk Assessment Upper Extremity Assessment Upper Extremity Assessment: LUE deficits/detail RUE Deficits / Details: Pt self limiting AROM in RUE due to  pain at IV site with increased movement, shoulder abduction appears approx 3+/5, decreased grip strength; sensation appears  intact to light touch RUE Coordination: decreased fine motor;decreased gross motor LUE Deficits / Details: LUE shoulder abduction approx 4/5, decreased AROM in shoulder flexion however is [facility]    Lower Extremity Assessment Lower Extremity Assessment: Defer to PT evaluation       Communication Communication Communication: No difficulties   Cognition Arousal/Alertness: Awake/alert Behavior During Therapy: WFL for tasks assessed/performed Overall Cognitive Status: Within Functional Limits for tasks assessed                                                      Home Living Family/patient expects to be discharged to:: Private residence Living Arrangements: Children;Spouse/significant other Available Help at Discharge: Available 24 hours/day (children work intermittently, spouse is home 24 hrs) Type of Home: House Home Access: Stairs to enter Technical brewer of Steps: 4 Entrance Stairs-Rails: Right;Left;Can reach both Home Layout: One level     Bathroom Shower/Tub: Occupational psychologist: Handicapped height     Home Equipment: Grab bars - tub/shower;Grab bars - toilet;Shower seat;Cane - single point;Walker - 4 wheels          Prior Functioning/Environment Level of Independence: Independent with assistive device(s);Needs assistance  Gait / Transfers Assistance Needed: occasionally uses rollator  ADL's / Homemaking Assistance Needed: Pt reports she completes ADLs independently approx 90-95% of the time; intermittently needs assistance/supervision for bathing/dressing ADLs            OT Problem List: Decreased strength;Impaired UE functional use;Decreased range of motion      OT Treatment/Interventions: Self-care/ADL training;DME and/or AE instruction;Therapeutic activities;Balance training;Therapeutic exercise;Manual therapy;Patient/family education    OT Goals(Current goals can be found in the care plan section) Acute Rehab OT  Goals Patient Stated Goal: return home  OT Goal Formulation: With patient Time For Goal Achievement: 06/15/17 Potential to Achieve Goals: Good  OT Frequency: Min 2X/week                             AM-PAC PT "6 Clicks" Daily Activity     Outcome Measure Help from another person eating meals?: A Little Help from another person taking care of personal grooming?: A Little Help from another person toileting, which includes using toliet, bedpan, or urinal?: A Little Help from another person bathing (including washing, rinsing, drying)?: A Little Help from another person to put on and taking off regular upper body clothing?: A Little Help from another person to put on and taking off regular lower body clothing?: A Little 6 Click Score: 18   End of Session Nurse Communication: Mobility status  Activity Tolerance: Patient tolerated treatment well Patient left: in bed;with bed alarm set;with family/visitor present  OT Visit Diagnosis: Muscle weakness (generalized) (M62.81)                Time: 0830-0900 OT Time Calculation (min): 30 min Charges:  OT General Charges $OT Visit: 1 Procedure OT Evaluation $OT Eval Low Complexity: 1 Procedure G-Codes:     [name], OT Pager [phone] 06/01/2017   [name] 06/01/2017, 9:35 AM

## 2017-06-01 NOTE — Progress Notes (Signed)
Patient refused to have morning lab work.  Patient advised that she has already had enough lab work done.

## 2017-06-01 NOTE — Progress Notes (Signed)
STROKE TEAM PROGRESS NOTE   HISTORY OF PRESENT ILLNESS (per record)  GEORGA STYS is an 66 y.o. female who initially presented to [facility] for assessment of a one week history of right grip strength weakness. MRI was obtained on 05/30/17, revealing acute and subacute infarcts in the left hemisphere (mainly in left MCA distribution; some borderzone between left MCA and left PCA), raising possibility of embolic source.  Dr. Leta Baptist recommended inpatient admission to expedite workup for embolic strokes, to include CTA of head and neck, transthoracic echocardiogram, possible TEE and implanted loop recorder, blood pressure, diabetes, lipid screening.   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    Patient was not administered IV t-PA secondary to out of window.   SUBJECTIVE (INTERVAL HISTORY)  No acute events overnight. Daughter is at bedside.  Pt says that she had some headache Tuesday, she did not have headaches prior to that since 1999. [facility] went [facility]. Then Sunday she went to [facility] - and felt that her right hand was ice cold, and when she stood up, it was like wave hit ocean and went sliding down the stairs. Now it is much improved.     OBJECTIVE Temp:  [97.8 F (36.6 C)-98.1 F (36.7 C)] 97.8 F (36.6 C) (08/14 0036) Pulse Rate:  [65-75] 67 (08/14 0636) Cardiac Rhythm: Normal sinus rhythm (08/14 0700) Resp:  [13-20] 16 (08/14 0636) BP: (113-180)/(45-93) 132/54 (08/14 0636) SpO2:  [96 %-100 %] 99 % (08/14 0636) Weight:  [161 lb 8 oz (73.3 kg)] 161 lb 8 oz (73.3 kg) (08/13 2224)  CBC:  Recent Labs Lab 05/31/17 1627 05/31/17 1647  WBC 11.1*  --   NEUTROABS 8.3*  --   HGB 11.9* 12.9  HCT 36.6 38.0  MCV 85.1  --   PLT 346  --     Basic Metabolic Panel:  Recent Labs Lab 05/31/17 1627  05/31/17 1647  NA 133* 134*  K 4.5 4.5  CL 101 102  CO2 23  --   GLUCOSE 297* 306*  BUN 21* 25*  CREATININE 1.18* 1.20*  CALCIUM 9.4  --     Lipid Panel: No results found for: CHOL, TRIG, HDL, CHOLHDL, VLDL, LDLCALC HgbA1c: No results found for: HGBA1C Urine Drug Screen: No results found for: LABOPIA, COCAINSCRNUR, LABBENZ, AMPHETMU, THCU, LABBARB  Alcohol Level No results found for: [place] I have personally reviewed the radiological images below and agree with the radiology interpretations.  Ct Angio Head and neck W Or Wo Contrast 05/31/2017 IMPRESSION: 1. No emergent large vessel occlusion. 2. Large amount of noncalcified plaque at the proximal left internal carotid artery, causing 60-65% stenosis. This is the most likely source of emboli for the previously identified left parietal and occipital infarcts. 3. No other intracranial or cervical arterial stenosis. 4. Minimal Aortic Atherosclerosis (ICD10-I70.0).   Mr Jeri Cos Wo Contrast 05/30/2017 IMPRESSION: Multiple small areas of acute infarct in the left parietal cortex and left occipital cortex. Recommend CTA head and neck to evaluate for source of emboli or stenosis causing these infarcts Numerous chronic white matter infarcts bilaterally   CUS - 1-39% right internal carotid artery stenosis and 40-59% left internal carotid artery stenosis. Vertebral arteries are patent with antegrade flow.  TTE - Left ventricle: The cavity size was normal. Systolic function was   normal. The estimated  ejection fraction was in the range of 60%   to 65%. Wall motion was normal; there were no regional wall   motion abnormalities. Doppler parameters are consistent with   abnormal left ventricular relaxation (grade 1 diastolic   dysfunction).  Carotid angiography - pending    PHYSICAL EXAM  Temp:  [97.6 F (36.4 C)-98.2 F (36.8 C)] 98.2 F (36.8 C) (08/14 2125) Pulse Rate:  [65-72] 67 (08/14 2125) Resp:  [16-18] 16 (08/14  2125) BP: (113-145)/(45-63) 143/62 (08/14 2125) SpO2:  [96 %-99 %] 97 % (08/14 2125)  General - Well nourished, well developed, in no apparent distress.  Ophthalmologic - Fundi not visualized due to small pupils.  Cardiovascular - Regular rate and rhythm.  Mental Status -  Level of arousal and orientation to time, place, and person were intact. Language including expression, naming, repetition, comprehension was assessed and found intact. Fund of Knowledge was assessed and was intact.  Cranial Nerves II - XII - II - Visual field intact OU. III, IV, VI - Extraocular movements intact. V - Facial sensation intact bilaterally. VII - Facial movement intact bilaterally. VIII - Hearing & vestibular intact bilaterally. X - Palate elevates symmetrically. XI - Chin turning & shoulder shrug intact bilaterally. XII - Tongue protrusion intact.  Motor Strength - The patient's strength was normal in all extremities except right hand subtle dexterity difficulty and pronator drift was absent.  Bulk was normal and fasciculations were absent.   Motor Tone - Muscle tone was assessed at the neck and appendages and was normal.  Reflexes - The patient's reflexes were 1+ in all extremities and she had no pathological reflexes.  Sensory - Light touch, temperature/pinprick were assessed and were symmetrical.    Coordination - The patient had normal movements in the hands with no ataxia or dysmetria.  Tremor was absent.  Gait and Station - deferred   ASSESSMENT/PLAN Ms. JERMANI PUND is a 66 y.o. female with history of HTN, HLD, pseudomotor cerebri, DM  presenting with headache and weakness of right hand. She did not receive IV t-PA due to out of window   Stroke: acute and subacute small pathcy infarcts in the left MCA distribution, possibly artery to artery embolic due to left ICA stenosis   Resultant  Right hand subtle weakness  MRI head Multiple small areas of acute infarct in the left  parietal cortex and left occipital cortex. Numerous chronic white matter infarcts bilaterally  CTA head and neck - Large amount of noncalcified plaque at the proximal left internal carotid artery, causing 60-65% stenosis.   2D Echo  EF 60-65%  CUS - left ICA 40-59% stenosis  LDL pending  HgbA1c pending  Recommend VVS consultation for possible left CEA  SCDs for VTE prophylaxis  Diet heart healthy/carb modified Room service appropriate? Yes; Fluid consistency: Thin  aspirin 81 mg daily prior to admission, now on aspirin 325 mg daily. Recommend DAPT for 3 months and then plavix alone according to POINT study.   Patient counseled to be compliant with her antithrombotic medications  Ongoing aggressive stroke risk factor management  Therapy recommendations:  Outpatient OT  Disposition:  Pending  Left ICA stenosis  CTA neck - left ICA 60-65%  CUS - left ICA 40-59%  VVS consulted, considering CEA vs. CAS.  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 After hours, contact General Neurology

## 2017-06-01 NOTE — Consult Note (Signed)
New Carotid Patient  Requested by:  Dr. Vassie Moment (Neurology)  Reason for consultation: left carotid stenosis   History of Present Illness   [name] is a 66 y.o. (July 23, 1951) female who presents with chief complaint: right hand weakness and discoordination.  Roughly one week ago, patient started having right hand grip weakness and discoordination while at her Neurology appointment.  MRI Head found to be consistent with L CVA.  Pt was admitted to expedite her work-up.  At this point, the patient feels her right hand strength is 90% of normal and discoordination is improved.    Recent carotid studies demonstrated: RICA 6-29% stenosis, LICA 47-65% stenosis.  Patient has no prior history of TIA or stroke symptom.  The patient has never had amaurosis fugax or monocular blindness.  The patient has never had facial drooping or hemiplegia.  The patient has never had receptive or expressive aphasia.     The patient's risks factors for carotid disease include: IDDM, HLD, HTN.  Past Medical History:  Diagnosis Date  . Anemia    takes iron supplement  . Anesthesia complication    disorientation due to pseudotumor  . Diabetes mellitus    IDDM  . Family history of anesthesia complication 79 yrs ago   brother stopped breathing for a minute or two  . GERD (gastroesophageal reflux disease)   . Headache(784.0)    due to pseudotumor; daily headache  . Hypercholesteremia    unable to tolerate statins  . Hypertension    under control with meds., has been on med. x 5 yr.  . Peripheral neuropathy   . PONV (postoperative nausea and vomiting)   . 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 Synovitis of ankle 04/2013   left  . Wears dentures    full    Past Surgical History:  Procedure Laterality Date  . ANKLE ARTHROSCOPY Left 05/18/2013   Procedure: LEFT ANKLE ARTHROSCOPY WITH DEBRIDEMENT,  SUBTALAR OPEN  DEBRIDEMENT ;  Surgeon: Wylene Simmer, MD;  Location: [facility];  Service: Orthopedics;  Laterality: Left;  . APPENDECTOMY     as a child  . BALLOON DILATION N/A 07/04/2013   Procedure: BALLOON DILATION;  Surgeon: Garlan Fair, MD;  Location: [facility];  Service: Endoscopy;  Laterality: N/A;  . BLADDER SUSPENSION    . BREAST LUMPECTOMY W/ NEEDLE LOCALIZATION Left 05/22/2011  . COLONOSCOPY WITH PROPOFOL N/A 05/14/2014   Procedure: COLONOSCOPY WITH PROPOFOL;  Surgeon: Garlan Fair, MD;  Location: [facility];  Service: Endoscopy;  Laterality: N/A;  . ESOPHAGEAL DILATION  06/23/2006; 08/05/2004  . ESOPHAGOGASTRODUODENOSCOPY N/A 07/04/2013   Procedure: ESOPHAGOGASTRODUODENOSCOPY (EGD);  Surgeon: Garlan Fair, MD;  Location: [facility];  Service: Endoscopy;  Laterality: N/A;  . ESOPHAGOGASTRODUODENOSCOPY (EGD) WITH PROPOFOL N/A 05/14/2014   Procedure: ESOPHAGOGASTRODUODENOSCOPY (EGD) WITH PROPOFOL;  Surgeon: Garlan Fair, MD;  Location: [facility];  Service: Endoscopy;  Laterality: N/A;  . LUMBAR PERITONEAL SHUNT     x 2  . OVARIAN CYST REMOVAL     age 39  . SHUNT REVISION  2007  . TOTAL ABDOMINAL HYSTERECTOMY W/ BILATERAL SALPINGOOPHORECTOMY  1994   age 79s  . WRIST SURGERY Left 2014    [name]    Social History   Social History  . Marital status: Married    Spouse name: Simona Huh  . Number of children: 2  .  Years of education: 69   Occupational History  . Not on file.   Social History Main Topics  . Smoking status: Never Smoker  . Smokeless tobacco: Never Used  . Alcohol use No  . Drug use: No  . Sexual activity: Not on file   Other Topics Concern  . Not on file   Social History Narrative   Lives at home with husband and daughter   Caffeine use: coke daily       Family History  Problem Relation Age of Onset  . Heart disease Father   . Migraines Neg Hx     Current Facility-Administered Medications  Medication Dose Route Frequency  Provider Last Rate Last Dose  . 0.9 %  sodium chloride infusion   Intravenous Continuous Jani Gravel, MD      . acetaminophen (TYLENOL) tablet 650 mg  650 mg Oral Q4H PRN Jani Gravel, MD       Or  . acetaminophen (TYLENOL) solution 650 mg  650 mg Per Tube Q4H PRN Jani Gravel, MD       Or  . acetaminophen (TYLENOL) suppository 650 mg  650 mg Rectal Q4H PRN Jani Gravel, MD      . aspirin chewable tablet 324 mg  324 mg Oral Once Jani Gravel, MD      . aspirin EC tablet 325 mg  325 mg Oral Daily Jani Gravel, MD      . diclofenac sodium (VOLTAREN) 1 % transdermal gel 2 g  2 g Topical QID PRN Jani Gravel, MD   2 g at 06/01/17 1057  . estropipate (OGEN) tablet 0.75 mg  0.75 mg Oral Daily Jani Gravel, MD   0.75 mg at 06/01/17 1057  . insulin aspart (novoLOG) injection 0-9 Units  0-9 Units Subcutaneous TID WC Jani Gravel, MD   3 Units at 06/01/17 1200  . insulin glargine (LANTUS) injection 18 Units  18 Units Subcutaneous QHS Jani Gravel, MD   18 Units at 05/31/17 2313  . metoprolol succinate (TOPROL-XL) 24 hr tablet 100 mg  100 mg Oral Daily Jani Gravel, MD   100 mg at 06/01/17 1057  . metoprolol tartrate (LOPRESSOR) tablet 50 mg  50 mg Oral QHS Jani Gravel, MD   50 mg at 05/31/17 2314  . pantoprazole (PROTONIX) EC tablet 40 mg  40 mg Oral Daily PRN Jani Gravel, MD        Allergies  Allergen Reactions  . Carbamazepine Hives, Shortness Of Breath and Other (See Comments)  . Penicillins Anaphylaxis and Other (See Comments)    Mother, father and brother have history of anaphylaxis reaction to penicillin so pt does not take   . Statins Other (See Comments)    Severe muscle weakness, leg numbness, severe headaches, chest pain   . Tricyclic Antidepressants Other (See Comments)    IMPAIRED MEMORY  . Atorvastatin Other (See Comments)    Severe muscle weakness, leg numbness, severe headaches, chest pain   . Ezetimibe Other (See Comments)    Severe muscle weakness, leg numbness, severe headaches, chest pain   .  Nortriptyline Other (See Comments)    IMPAIRED MEMORY  . Quinapril Hcl Other (See Comments)    Unknown reaction  . Rifampin Diarrhea  . Metoclopramide Nausea And Vomiting and Rash    REVIEW OF SYSTEMS (negative unless checked):   Cardiac:  []  Chest pain or chest pressure? []  Shortness of breath upon activity? []  Shortness of breath when lying flat? []  Irregular heart rhythm?  Vascular:  []   Pain in calf, thigh, or hip brought on by walking? []  Pain in feet at night that wakes you up from your sleep? []  Blood clot in your veins? []  Leg swelling?  Pulmonary:  []  Oxygen at home? []  Productive cough? []  Wheezing?  Neurologic:  [x]  Sudden weakness in arms or legs? []  Sudden numbness in arms or legs? []  Sudden onset of difficult speaking or slurred speech? [x]  Recurrent visual loss? []  Problems with dizziness?  Gastrointestinal:  []  Blood in stool? []  Vomited blood?  Genitourinary:  []  Burning when urinating? []  Blood in urine?  Psychiatric:  []  Major depression  Hematologic:  []  Bleeding problems? []  Problems with blood clotting?  Dermatologic:  []  Rashes or ulcers?  Constitutional:  []  Fever or chills?  Ear/Nose/Throat:  []  Change in hearing? []  Nose bleeds? []  Sore throat?  Musculoskeletal:  []  Back pain? []  Joint pain? []  Muscle pain?   For VQI Use Only   PRE-ADM LIVING Home  AMB STATUS Ambulatory  CAD Sx None  PRIOR CHF None  STRESS TEST No    Physical Examination     Vitals:   06/01/17 0636 06/01/17 1040 06/01/17 1146 06/01/17 1452  BP: (!) 132/54 (!) 124/51 (!) 145/63 140/63  Pulse: 67 68 66 67  Resp: 16 18 18 18   Temp:  98 F (36.7 C) 97.6 F (36.4 C) 98.1 F (36.7 C)  TempSrc:  Oral Oral Oral  SpO2: 99% 98% 98% 96%  Weight:      Height:       Body mass index is 26.88 kg/m.  General Alert, O x 3, WD, NAD  Head [place]/AT,    Ear/Nose/ Throat Hearing grossly intact, nares without erythema or drainage, oropharynx without  Erythema or Exudate, Mallampati score: 3,   Eyes PERRLA, EOMI,    Neck Supple, mid-line trachea,    Pulmonary Sym exp, good B air movt, CTA B  Cardiac RRR, Nl S1, S2, Faint SEM, No rubs, +S3, no S4  Vascular Vessel Right Left  Radial Palpable Palpable  Brachial Palpable Palpable  Carotid Palpable, No Bruit Palpable, No Bruit  Aorta Not palpable N/A  Femoral Palpable Palpable  Popliteal Not palpable Not palpable  PT Faintly palpable Faintly palpable  DP Faintly palpable Faintly palpable    Gastro- intestinal soft, non-distended, non-tender to palpation, No guarding or rebound, no HSM, no masses, no CVAT B, No palpable prominent aortic pulse, surgical incision healed    Musculo- skeletal M/S 5/5 throughout  , Extremities without ischemic changes  , No edema present, No visible varicosities , No Lipodermatosclerosis present  Neurologic Cranial nerves 2-12 intact for documented visual field loss , Pain and light touch intact in extremities , Motor exam as listed above  Psychiatric Judgement intact, Mood & affect appropriate for pt's clinical situation  Dermatologic See M/S exam for extremity exam, No rashes otherwise noted  Lymphatic  Palpable lymph nodes: None    Radiology     Ct Angio Head W Or Wo Contrast  Result Date: 05/31/2017 CLINICAL DATA:  Headache and right arm weakness EXAM: CT ANGIOGRAPHY HEAD AND NECK TECHNIQUE: Multidetector CT imaging of the head and neck was performed using the standard protocol during bolus administration of intravenous contrast. Multiplanar CT image reconstructions and MIPs were obtained to evaluate the vascular anatomy. Carotid stenosis measurements (when applicable) are obtained utilizing NASCET criteria, using the distal internal carotid diameter as the denominator. CONTRAST:  50 mL Isovue 370 COMPARISON:  None. FINDINGS: CT HEAD FINDINGS Brain: No  mass lesion, intraparenchymal hemorrhage or extra-axial collection. No evidence of acute cortical infarct.  Brain parenchyma and CSF-containing spaces are normal for age. Vascular: No hyperdense vessel or unexpected calcification. Skull: Normal visualized skull base, calvarium and extracranial soft tissues. Sinuses/Orbits: No sinus fluid levels or advanced mucosal thickening. No mastoid effusion. Normal orbits. Review of the MIP images confirms the above findings CTA NECK FINDINGS Aortic arch: There is no aneurysm or dissection of the visualized ascending aorta or aortic arch. Normal 3 vessel aortic branching pattern. The visualized proximal subclavian arteries are normal. Mild calcific aortic atherosclerosis. Right carotid system: The right common carotid origin is widely patent. There is no common carotid or internal carotid artery dissection or aneurysm. There is mixed calcified and noncalcified plaque at the right carotid bifurcation without hemodynamically significant stenosis. Left carotid system: There is predominantly noncalcified plaque within the proximal left internal carotid artery. This results in stenosis that measures 60% by NASCET criteria. Subjectively, the stenosis appears severe. Vertebral arteries: The vertebral system is left dominant. Both vertebral artery origins are normal. Both vertebral arteries are normal to their confluence with the basilar artery. Skeleton: There is no bony spinal canal stenosis. No lytic or blastic lesions. Other neck: The nasopharynx is clear. The oropharynx and hypopharynx are normal. The epiglottis is normal. The supraglottic larynx, glottis and subglottic larynx are normal. No retropharyngeal collection. The parapharyngeal spaces are preserved. The parotid and submandibular glands are normal. No sialolithiasis or salivary ductal dilatation. The thyroid gland is normal. There is no cervical lymphadenopathy. Upper chest: No pneumothorax or pleural effusion. No nodules or masses. Review of the MIP images confirms the above findings CTA HEAD FINDINGS Anterior circulation:  --Intracranial internal carotid arteries: Normal. --Anterior cerebral arteries: Normal. --Middle cerebral arteries: Normal. --Posterior communicating arteries: Present bilaterally. Posterior circulation: --Posterior cerebral arteries: Normal. --Superior cerebellar arteries: Normal. --Basilar artery: Normal. --Anterior inferior cerebellar arteries: Normal. --Posterior inferior cerebellar arteries: Normal. Venous sinuses: As permitted by contrast timing, patent. Anatomic variants: None Delayed phase: No parenchymal contrast enhancement. Review of the MIP images confirms the above findings IMPRESSION: 1. No emergent large vessel occlusion. 2. Large amount of noncalcified plaque at the proximal left internal carotid artery, causing 60-65% stenosis. This is the most likely source of emboli for the previously identified left parietal and occipital infarcts. 3. No other intracranial or cervical arterial stenosis. 4. Minimal Aortic Atherosclerosis (ICD10-I70.0). Electronically Signed   By: Ulyses Jarred M.D.   On: 05/31/2017 21:59   Dg Chest 2 View  Result Date: 06/01/2017 CLINICAL DATA:  Stroke.  Headache and right-sided weakness. EXAM: CHEST  2 VIEW COMPARISON:  Radiographs 06/18/2016 FINDINGS: The cardiomediastinal contours are unchanged, heart size upper limits normal. The lungs are clear. Pulmonary vasculature is normal. No consolidation, pleural effusion, or pneumothorax. No acute osseous abnormalities are seen. Shunt catheter tubing noted in the right upper quadrant of the abdomen. IMPRESSION: No active cardiopulmonary disease. Electronically Signed   By: Jeb Levering M.D.   On: 06/01/2017 01:05   Ct Angio Neck W And/or Wo Contrast  Result Date: 05/31/2017 CLINICAL DATA:  Headache and right arm weakness EXAM: CT ANGIOGRAPHY HEAD AND NECK TECHNIQUE: Multidetector CT imaging of the head and neck was performed using the standard protocol during bolus administration of intravenous contrast. Multiplanar CT  image reconstructions and MIPs were obtained to evaluate the vascular anatomy. Carotid stenosis measurements (when applicable) are obtained utilizing NASCET criteria, using the distal internal carotid diameter as the denominator. CONTRAST:  50 mL Isovue 370  COMPARISON:  None. FINDINGS: CT HEAD FINDINGS Brain: No mass lesion, intraparenchymal hemorrhage or extra-axial collection. No evidence of acute cortical infarct. Brain parenchyma and CSF-containing spaces are normal for age. Vascular: No hyperdense vessel or unexpected calcification. Skull: Normal visualized skull base, calvarium and extracranial soft tissues. Sinuses/Orbits: No sinus fluid levels or advanced mucosal thickening. No mastoid effusion. Normal orbits. Review of the MIP images confirms the above findings CTA NECK FINDINGS Aortic arch: There is no aneurysm or dissection of the visualized ascending aorta or aortic arch. Normal 3 vessel aortic branching pattern. The visualized proximal subclavian arteries are normal. Mild calcific aortic atherosclerosis. Right carotid system: The right common carotid origin is widely patent. There is no common carotid or internal carotid artery dissection or aneurysm. There is mixed calcified and noncalcified plaque at the right carotid bifurcation without hemodynamically significant stenosis. Left carotid system: There is predominantly noncalcified plaque within the proximal left internal carotid artery. This results in stenosis that measures 60% by NASCET criteria. Subjectively, the stenosis appears severe. Vertebral arteries: The vertebral system is left dominant. Both vertebral artery origins are normal. Both vertebral arteries are normal to their confluence with the basilar artery. Skeleton: There is no bony spinal canal stenosis. No lytic or blastic lesions. Other neck: The nasopharynx is clear. The oropharynx and hypopharynx are normal. The epiglottis is normal. The supraglottic larynx, glottis and subglottic  larynx are normal. No retropharyngeal collection. The parapharyngeal spaces are preserved. The parotid and submandibular glands are normal. No sialolithiasis or salivary ductal dilatation. The thyroid gland is normal. There is no cervical lymphadenopathy. Upper chest: No pneumothorax or pleural effusion. No nodules or masses. Review of the MIP images confirms the above findings CTA HEAD FINDINGS Anterior circulation: --Intracranial internal carotid arteries: Normal. --Anterior cerebral arteries: Normal. --Middle cerebral arteries: Normal. --Posterior communicating arteries: Present bilaterally. Posterior circulation: --Posterior cerebral arteries: Normal. --Superior cerebellar arteries: Normal. --Basilar artery: Normal. --Anterior inferior cerebellar arteries: Normal. --Posterior inferior cerebellar arteries: Normal. Venous sinuses: As permitted by contrast timing, patent. Anatomic variants: None Delayed phase: No parenchymal contrast enhancement. Review of the MIP images confirms the above findings IMPRESSION: 1. No emergent large vessel occlusion. 2. Large amount of noncalcified plaque at the proximal left internal carotid artery, causing 60-65% stenosis. This is the most likely source of emboli for the previously identified left parietal and occipital infarcts. 3. No other intracranial or cervical arterial stenosis. 4. Minimal Aortic Atherosclerosis (ICD10-I70.0). Electronically Signed   By: Ulyses Jarred M.D.   On: 05/31/2017 21:59   Based on my review of this patient's neck CTA, the LICA appears to have a complex plaque that likely >70% stenosis.  There is plaque vs thrombus distal and adjacent to the calcific plaque present.  The disease appears to extend slightly past angle of L mandible.  The proximal CCA appears to be long enough for a TCAR.   MRI Head (05/30/17)  Multiple small areas of acute infarct in the left parietal cortex and left occipital cortex.   Recommend CTA head and neck to evaluate  for source of emboli or stenosis causing these infarcts  Numerous chronic white matter infarcts bilaterally   Non-invasive Vascular Imaging   B Carotid Duplex (06/01/2017):  R ICA stenosis:  1-39% R VA: patent and antegrade L ICA stenosis:  40-59%i L VA: patent and antegrade  TTE (06/01/2017):  - Left ventricle: The cavity size was normal. Systolic function was   normal. The estimated ejection fraction was in the range of 60%  to 65%. Wall motion was normal; there were no regional wall   motion abnormalities. Doppler parameters are consistent with   abnormal left ventricular relaxation (grade 1 diastolic   dysfunction).   Laboratory   CBC CBC Latest Ref Rng & Units 05/31/2017 05/31/2017 02/06/2015  WBC 4.0 - 10.5 K/uL - 11.1(H) -  Hemoglobin 12.0 - 15.0 g/dL 12.9 11.9(L) 11.9(L)  Hematocrit 36.0 - 46.0 % 38.0 36.6 35.0(L)  Platelets 150 - 400 K/uL - 346 -    BMP BMP Latest Ref Rng & Units 05/31/2017 05/31/2017 01/15/2016  Glucose 65 - 99 mg/dL 306(H) 297(H) 327(H)  BUN 6 - 20 mg/dL 25(H) 21(H) 14  Creatinine 0.44 - 1.00 mg/dL 1.20(H) 1.18(H) 1.06(H)  BUN/Creat Ratio 11 - 26 - - 13  Sodium 135 - 145 mmol/L 134(L) 133(L) 137  Potassium 3.5 - 5.1 mmol/L 4.5 4.5 5.4(H)  Chloride 101 - 111 mmol/L 102 101 94(L)  CO2 22 - 32 mmol/L - 23 23  Calcium 8.9 - 10.3 mg/dL - 9.4 10.2    Coagulation Lab Results  Component Value Date   INR 0.99 05/31/2017   No results found for: PTT  Lipids No results found for: CHOL, TRIG, HDL, CHOLHDL, VLDL, LDLCALC, LDLDIRECT    Medical Decision Making   SHANIKKA WONDERS is a 66 y.o. female who presents with: B chronic CVA, L acute CVA, possible sx L ICA stenosis likely >70%   I agree that the CTA looks suspicious in this patient for a LICA source for her CVA.  However, the disease appears to extend past the angle of the mandible.  To get a more exact determination of lesion position, I recommend: B carotid and cerebral angiogram. I  discussed with the patient the nature of angiographic procedures, especially the limited patencies of any endovascular intervention.   The patient is aware of that the risks of an angiographic procedure include but are not limited to: bleeding, infection, access site complications, renal failure, embolization, rupture of vessel, dissection, arteriovenous fistula, possible need for emergent surgical intervention, possible need for surgical procedures to treat the patient's pathology, anaphylactic reaction to contrast, and stroke and death.   The patient is considering proceeding.  She is scheduled for this coming Thursday, 06/02/17.  If the lesion is high, would evaluate for femoral CAS vs TCAR.  Otherwise, would offer her L CEA in 2-4 weeks. I discussed in depth with the patient the nature of atherosclerosis, and emphasized the importance of maximal medical management including strict control of blood pressure, blood glucose, and lipid levels, obtaining regular exercise, antiplatelet agents, and cessation of smoking.   The patient is currently not on a statin due to reported allergy.  The patient is currently on an anti-platelet: ASA.  The patient is aware that without maximal medical management the underlying atherosclerotic disease process will progress, limiting the benefit of any interventions.  Thank you for allowing [place] to participate in this patient's care.   Adele Barthel, MD, FACS [facility] Office: [phone] Pager: [phone]  06/01/2017, 3:57 PM

## 2017-06-01 NOTE — Progress Notes (Addendum)
PROGRESS NOTE   EVONE ARSENEAU  FWY:[id number]    DOB: 09-18-1951    DOA: 05/31/2017  PCP: [name], MD   I have briefly reviewed patients previous medical records in [facility].  Brief Narrative:  66 year old right-handed female, PMH of DM 2/IDDM, GERD, pseudotumor with chronic intermittent headaches (Dr. Delfin Edis, Neurology), HLD, HTN, seizures, independent but does not drive, presented to ED upon PCPs advice on 05/31/17 due to right upper extremity weakness. She first noted the symptoms on 05/26/17 when she accompanied her daughter to a physician's appointment. Since then she has had intermittent right upper extremity weakness associated with cold and numb sensation and intermittent headache worse than usual. Denied dysarthria, facial asymmetry or right lower extremity symptoms. Seen by her PCP on 05/28/17 and MRI was ordered. Underwent MRI brain 05/30/17 and received call from PCPs office on day of admission to come to the ED. MRI brain revealed acute and subacute left brain infarcts. Neurology consulted and stroke workup ongoing.   Assessment & Plan:   Principal Problem:   Stroke [facility], [place]) Active Problems:   Type II diabetes mellitus with complication (HCC)   Hypertension   Hyponatremia   1. Acute left brain stroke: Resultant right upper extremity intermittent weakness and numbness. Etiology: Possibly embolic from left carotid artery stenosis Vs Cardioembolic. MRI brain 05/30/17: Multiple small areas of acute infarct in the left parietal cortex and left occipital cortex. Numerous chronic white matter infarcts bilaterally. CTA head and neck: No emergent large vessel occlusion, large amount of noncalcified plaque at the proximal left ICA, causing 60-65 percent stenosis and this is the most likely source of emboli, no other intracranial or cervical artery stenosis. TTE: LVEF 60-65% and grade 1 diastolic dysfunction. ? TEE. Carotid Dopplers: 1-39 percent right ICA stenosis and 40-59  percent left ICA stenosis. Vertebral arteries are patent with antegrade flow. LDL an A1c pending (patient refused labs this morning). Neurology was consulted. Patient was on aspirin 81 MG daily prior to admission which has been increased to 325 MG daily by stroke M.D (mention in initial consult node re ASA failure and change to Plavix but now written for ASA, discussed with Dr. Erlinda Hong who will review and make recommendations). Neurology recommends vascular surgery consultation for symptomatic left ICA stenosis. Outpatient OT. No PT follow-up. 2. Possible Symptomatic left ICA stenosis: Neurology have consulted vascular surgery. 3. Essential hypertension: Allow permissive hypertension for 5-7 days due to recent acute stroke. It has been about that time since beginning of symptoms. Continue home metoprolol dose. Holding telmisartan, Clonidine & Lasix 4. 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 Hold glipizide. Adjust Lantus and NovoLog SSI as needed >increase Lantus to home dose 32 units and change sliding scale to moderate with HS scale. Uncontrolled as indicated below. Continue gabapentin. Resume home dose of Domperidone. 5. Hyperlipidemia: LDL pending, goal <70. Statin allergy listed. 6. Pseudotumor cerebri: Follows with outpatient neurology and has intermittent headaches. Status post LP shunt. On acetazolamide. 7. Anemia: Follow CBCs. 8. Mild hyponatremia: Pseudohyponatremia from hyperglycemia. 9. Near syncope: Reported by patient and family when she was in radiology to have a MRI on 05/30/17.? Etiology.? Related to acute stroke versus left carotid stenosis.   DVT prophylaxis: SCD's Code Status: Full Family Communication: Discussed in detail with patient's daughter at bedside. Disposition: DC home when medically improved.   Consultants:  Neurology Vascular surgery-being consulted by neurology.   Procedures:  2-D echo  06/01/17: Study Conclusions  - Left ventricle: The cavity size was normal. Systolic function was   normal. The estimated ejection fraction was in the range of 60%   to 65%. Wall motion was normal; there were no regional wall   motion abnormalities. Doppler parameters are consistent with   abnormal left ventricular relaxation (grade 1 diastolic   dysfunction).   Antimicrobials:  None    Subjective: Patient seen this morning. No current right upper extremity weakness or altered sensation reported. She however gives approximately 1 week history of intermittent right upper extremity weakness, numbness and "cold sensation". Denies slurred speech, leaning of her face, right lower extremity symptoms. At times has had worse than usual headaches. Near syncopal episode during MRI on Sunday.   ROS: No dizziness, lightheadedness, chest pain, dyspnea or palpitations.  Objective:  Vitals:   06/01/17 0636 06/01/17 1040 06/01/17 1146 06/01/17 1452  BP: (!) 132/54 (!) 124/51 (!) 145/63 140/63  Pulse: 67 68 66 67  Resp: 16 18 18 18   Temp:  98 F (36.7 C) 97.6 F (36.4 C) 98.1 F (36.7 C)  TempSrc:  Oral Oral Oral  SpO2: 99% 98% 98% 96%  Weight:      Height:        Examination:  General exam: Pleasant middle-aged female lying comfortably propped up in bed. Respiratory system: Clear to auscultation. Respiratory effort normal. Cardiovascular system: S1 & S2 heard, RRR. No JVD, murmurs, rubs, gallops or clicks. No pedal edema. Telemetry: Sinus rhythm. Gastrointestinal system: Abdomen is nondistended, soft and nontender. No organomegaly or masses felt. Normal bowel sounds heard. Central nervous system: Alert and oriented. No cranial nerve deficits. Extremities: Right upper extremity with grade 4 x 5 power and positive pronator drift. Rest of extremities with grade 5 x 5 power. Skin: No rashes, lesions or ulcers Psychiatry: Judgement and insight appear normal. Mood & affect appropriate.      Data Reviewed: I have personally reviewed following labs and imaging studies  CBC:  Recent Labs Lab 05/31/17 1627 05/31/17 1647  WBC 11.1*  --   NEUTROABS 8.3*  --   HGB 11.9* 12.9  HCT 36.6 38.0  MCV 85.1  --   PLT 346  --    Basic Metabolic Panel:  Recent Labs Lab 05/31/17 1627 05/31/17 1647  NA 133* 134*  K 4.5 4.5  CL 101 102  CO2 23  --   GLUCOSE 297* 306*  BUN 21* 25*  CREATININE 1.18* 1.20*  CALCIUM 9.4  --    Liver Function Tests:  Recent Labs Lab 05/31/17 1627  AST 43*  ALT 26  ALKPHOS 159*  BILITOT 0.4  PROT 7.4  ALBUMIN 3.8   Coagulation Profile:  Recent Labs Lab 05/31/17 1627  INR 0.99   Cardiac Enzymes: No results for input(s): CKTOTAL, CKMB, CKMBINDEX, TROPONINI in the last 168 hours. HbA1C: No results for input(s): HGBA1C in the last 72 hours. CBG:  Recent Labs Lab 05/31/17 1924 05/31/17 2220 06/01/17 0616 06/01/17 1114  GLUCAP 160* 112* 328* 222*    No results found for this or any previous visit (from the past 240 hour(s)).       Radiology Studies: Ct Angio Head W Or Wo Contrast  Result Date: 05/31/2017 CLINICAL DATA:  Headache and right arm weakness EXAM: CT ANGIOGRAPHY HEAD AND NECK TECHNIQUE: Multidetector CT imaging of the head and neck was performed using the standard protocol during bolus administration of intravenous contrast. Multiplanar CT image reconstructions and MIPs were obtained to evaluate  the vascular anatomy. Carotid stenosis measurements (when applicable) are obtained utilizing NASCET criteria, using the distal internal carotid diameter as the denominator. CONTRAST:  50 mL Isovue 370 COMPARISON:  None. FINDINGS: CT HEAD FINDINGS Brain: No mass lesion, intraparenchymal hemorrhage or extra-axial collection. No evidence of acute cortical infarct. Brain parenchyma and CSF-containing spaces are normal for age. Vascular: No hyperdense vessel or unexpected calcification. Skull: Normal visualized skull base,  calvarium and extracranial soft tissues. Sinuses/Orbits: No sinus fluid levels or advanced mucosal thickening. No mastoid effusion. Normal orbits. Review of the MIP images confirms the above findings CTA NECK FINDINGS Aortic arch: There is no aneurysm or dissection of the visualized ascending aorta or aortic arch. Normal 3 vessel aortic branching pattern. The visualized proximal subclavian arteries are normal. Mild calcific aortic atherosclerosis. Right carotid system: The right common carotid origin is widely patent. There is no common carotid or internal carotid artery dissection or aneurysm. There is mixed calcified and noncalcified plaque at the right carotid bifurcation without hemodynamically significant stenosis. Left carotid system: There is predominantly noncalcified plaque within the proximal left internal carotid artery. This results in stenosis that measures 60% by NASCET criteria. Subjectively, the stenosis appears severe. Vertebral arteries: The vertebral system is left dominant. Both vertebral artery origins are normal. Both vertebral arteries are normal to their confluence with the basilar artery. Skeleton: There is no bony spinal canal stenosis. No lytic or blastic lesions. Other neck: The nasopharynx is clear. The oropharynx and hypopharynx are normal. The epiglottis is normal. The supraglottic larynx, glottis and subglottic larynx are normal. No retropharyngeal collection. The parapharyngeal spaces are preserved. The parotid and submandibular glands are normal. No sialolithiasis or salivary ductal dilatation. The thyroid gland is normal. There is no cervical lymphadenopathy. Upper chest: No pneumothorax or pleural effusion. No nodules or masses. Review of the MIP images confirms the above findings CTA HEAD FINDINGS Anterior circulation: --Intracranial internal carotid arteries: Normal. --Anterior cerebral arteries: Normal. --Middle cerebral arteries: Normal. --Posterior communicating arteries:  Present bilaterally. Posterior circulation: --Posterior cerebral arteries: Normal. --Superior cerebellar arteries: Normal. --Basilar artery: Normal. --Anterior inferior cerebellar arteries: Normal. --Posterior inferior cerebellar arteries: Normal. Venous sinuses: As permitted by contrast timing, patent. Anatomic variants: None Delayed phase: No parenchymal contrast enhancement. Review of the MIP images confirms the above findings IMPRESSION: 1. No emergent large vessel occlusion. 2. Large amount of noncalcified plaque at the proximal left internal carotid artery, causing 60-65% stenosis. This is the most likely source of emboli for the previously identified left parietal and occipital infarcts. 3. No other intracranial or cervical arterial stenosis. 4. Minimal Aortic Atherosclerosis (ICD10-I70.0). Electronically Signed   By: Ulyses Jarred M.D.   On: 05/31/2017 21:59   Dg Chest 2 View  Result Date: 06/01/2017 CLINICAL DATA:  Stroke.  Headache and right-sided weakness. EXAM: CHEST  2 VIEW COMPARISON:  Radiographs 06/18/2016 FINDINGS: The cardiomediastinal contours are unchanged, heart size upper limits normal. The lungs are clear. Pulmonary vasculature is normal. No consolidation, pleural effusion, or pneumothorax. No acute osseous abnormalities are seen. Shunt catheter tubing noted in the right upper quadrant of the abdomen. IMPRESSION: No active cardiopulmonary disease. Electronically Signed   By: Jeb Levering M.D.   On: 06/01/2017 01:05   Ct Angio Neck W And/or Wo Contrast  Result Date: 05/31/2017 CLINICAL DATA:  Headache and right arm weakness EXAM: CT ANGIOGRAPHY HEAD AND NECK TECHNIQUE: Multidetector CT imaging of the head and neck was performed using the standard protocol during bolus administration of intravenous contrast. Multiplanar CT  image reconstructions and MIPs were obtained to evaluate the vascular anatomy. Carotid stenosis measurements (when applicable) are obtained utilizing NASCET  criteria, using the distal internal carotid diameter as the denominator. CONTRAST:  50 mL Isovue 370 COMPARISON:  None. FINDINGS: CT HEAD FINDINGS Brain: No mass lesion, intraparenchymal hemorrhage or extra-axial collection. No evidence of acute cortical infarct. Brain parenchyma and CSF-containing spaces are normal for age. Vascular: No hyperdense vessel or unexpected calcification. Skull: Normal visualized skull base, calvarium and extracranial soft tissues. Sinuses/Orbits: No sinus fluid levels or advanced mucosal thickening. No mastoid effusion. Normal orbits. Review of the MIP images confirms the above findings CTA NECK FINDINGS Aortic arch: There is no aneurysm or dissection of the visualized ascending aorta or aortic arch. Normal 3 vessel aortic branching pattern. The visualized proximal subclavian arteries are normal. Mild calcific aortic atherosclerosis. Right carotid system: The right common carotid origin is widely patent. There is no common carotid or internal carotid artery dissection or aneurysm. There is mixed calcified and noncalcified plaque at the right carotid bifurcation without hemodynamically significant stenosis. Left carotid system: There is predominantly noncalcified plaque within the proximal left internal carotid artery. This results in stenosis that measures 60% by NASCET criteria. Subjectively, the stenosis appears severe. Vertebral arteries: The vertebral system is left dominant. Both vertebral artery origins are normal. Both vertebral arteries are normal to their confluence with the basilar artery. Skeleton: There is no bony spinal canal stenosis. No lytic or blastic lesions. Other neck: The nasopharynx is clear. The oropharynx and hypopharynx are normal. The epiglottis is normal. The supraglottic larynx, glottis and subglottic larynx are normal. No retropharyngeal collection. The parapharyngeal spaces are preserved. The parotid and submandibular glands are normal. No sialolithiasis or  salivary ductal dilatation. The thyroid gland is normal. There is no cervical lymphadenopathy. Upper chest: No pneumothorax or pleural effusion. No nodules or masses. Review of the MIP images confirms the above findings CTA HEAD FINDINGS Anterior circulation: --Intracranial internal carotid arteries: Normal. --Anterior cerebral arteries: Normal. --Middle cerebral arteries: Normal. --Posterior communicating arteries: Present bilaterally. Posterior circulation: --Posterior cerebral arteries: Normal. --Superior cerebellar arteries: Normal. --Basilar artery: Normal. --Anterior inferior cerebellar arteries: Normal. --Posterior inferior cerebellar arteries: Normal. Venous sinuses: As permitted by contrast timing, patent. Anatomic variants: None Delayed phase: No parenchymal contrast enhancement. Review of the MIP images confirms the above findings IMPRESSION: 1. No emergent large vessel occlusion. 2. Large amount of noncalcified plaque at the proximal left internal carotid artery, causing 60-65% stenosis. This is the most likely source of emboli for the previously identified left parietal and occipital infarcts. 3. No other intracranial or cervical arterial stenosis. 4. Minimal Aortic Atherosclerosis (ICD10-I70.0). Electronically Signed   By: Ulyses Jarred M.D.   On: 05/31/2017 21:59        Scheduled Meds: . aspirin  324 mg Oral Once  . aspirin EC  325 mg Oral Daily  . estropipate  0.75 mg Oral Daily  . insulin aspart  0-9 Units Subcutaneous TID WC  . insulin glargine  18 Units Subcutaneous QHS  . metoprolol succinate  100 mg Oral Daily  . metoprolol tartrate  50 mg Oral QHS   Continuous Infusions: . sodium chloride       LOS: 1 day     Stasha Naraine, MD, FACP, FHM. [facility] Pager [phone]  If 7PM-7AM, please contact night-coverage [email or web address] Password [facility] 06/01/2017, 3:31 PM

## 2017-06-02 DIAGNOSIS — I6522 Occlusion and stenosis of left carotid artery: Secondary | ICD-10-CM

## 2017-06-02 DIAGNOSIS — E782 Mixed hyperlipidemia: Secondary | ICD-10-CM

## 2017-06-02 LAB — LIPID PANEL
Cholesterol: 297 mg/dL — ABNORMAL HIGH (ref 0–200)
HDL: 49 mg/dL (ref >40)
LDL CALC: 211 mg/dL — AB (ref 0–99)
Total CHOL/HDL Ratio: 6.1 RATIO
Triglycerides: 184 mg/dL — ABNORMAL HIGH (ref <150)
VLDL: 37 mg/dL (ref 0–40)

## 2017-06-02 LAB — GLUCOSE, CAPILLARY
GLUCOSE-CAPILLARY: 173 mg/dL — AB (ref 65–99)
GLUCOSE-CAPILLARY: 175 mg/dL — AB (ref 65–99)
GLUCOSE-CAPILLARY: 174 mg/dL — AB (ref 65–99)
Glucose-Capillary: 228 mg/dL — ABNORMAL HIGH (ref 65–99)

## 2017-06-02 LAB — TSH: TSH: 2.297 u[IU]/mL (ref 0.350–4.500)

## 2017-06-02 LAB — VITAMIN B12: Vitamin B-12: 455 pg/mL (ref 180–914)

## 2017-06-02 NOTE — Progress Notes (Signed)
STROKE TEAM PROGRESS NOTE   SUBJECTIVE (INTERVAL HISTORY) Daughter is at the bedside.  Had long discussion with pt and her daughter regarding the stroke work up, medication treatment, carotid procedures, as well as rational behind them. Pt now agrees with DAPT, cerebral angio tomorrow and agree to follow up with PCP Dr. Felipa Eth for the PCSK9 inhibitor for HLD treatment.    OBJECTIVE Temp:  [97.6 F (36.4 C)-98.2 F (36.8 C)] 97.7 F (36.5 C) (08/15 0955) Pulse Rate:  [56-72] 56 (08/15 0955) Cardiac Rhythm: Sinus bradycardia (08/15 0704) Resp:  [16-20] 20 (08/15 0955) BP: (126-144)/(48-74) 144/74 (08/15 0955) SpO2:  [95 %-99 %] 95 % (08/15 0955)  CBC:   Recent Labs Lab 05/31/17 1627 05/31/17 1647  WBC 11.1*  --   NEUTROABS 8.3*  --   HGB 11.9* 12.9  HCT 36.6 38.0  MCV 85.1  --   PLT 346  --     Basic Metabolic Panel:   Recent Labs Lab 05/31/17 1627 05/31/17 1647  NA 133* 134*  K 4.5 4.5  CL 101 102  CO2 23  --   GLUCOSE 297* 306*  BUN 21* 25*  CREATININE 1.18* 1.20*  CALCIUM 9.4  --     Lipid Panel:     Component Value Date/Time   CHOL 297 (H) 06/02/2017 0741   TRIG 184 (H) 06/02/2017 0741   HDL 49 06/02/2017 0741   CHOLHDL 6.1 06/02/2017 0741   VLDL 37 06/02/2017 0741   LDLCALC 211 (H) 06/02/2017 0741   HgbA1c: No results found for: HGBA1C Urine Drug Screen: No results found for: LABOPIA, COCAINSCRNUR, LABBENZ, AMPHETMU, THCU, LABBARB  Alcohol Level No results found for: [name] I have personally reviewed the radiological images below and agree with the radiology interpretations.  Ct Angio Head and neck W Or Wo Contrast 05/31/2017 IMPRESSION: 1. No emergent large vessel occlusion. 2. Large amount of noncalcified plaque at the proximal left internal carotid artery, causing 60-65% stenosis. This is the most likely source of emboli for the previously identified left parietal and occipital infarcts. 3. No other intracranial or cervical arterial  stenosis. 4. Minimal Aortic Atherosclerosis (ICD10-I70.0).   Mr Jeri Cos Wo Contrast 05/30/2017 IMPRESSION: Multiple small areas of acute infarct in the left parietal cortex and left occipital cortex. Recommend CTA head and neck to evaluate for source of emboli or stenosis causing these infarcts Numerous chronic white matter infarcts bilaterally   CUS 06/01/2017 1-39% right internal carotid artery stenosis and 40-59% left internal carotid artery stenosis. Vertebral arteries are patent with antegrade flow.  TTE 8/114/2018  - Left ventricle: The cavity size was normal. Systolic function was normal. The estimated ejection fraction was in the range of 60% to 65%. Wall motion was normal; there were no regional wall motion abnormalities. Doppler parameters are consistent with abnormal left ventricular relaxation (grade 1 diastolic dysfunction).  Carotid angiography 06/01/2017 pending   PHYSICAL EXAM  Temp:  [97.6 F (36.4 C)-98.2 F (36.8 C)] 97.7 F (36.5 C) (08/15 0955) Pulse Rate:  [56-72] 56 (08/15 0955) Resp:  [16-20] 20 (08/15 0955) BP: (126-144)/(48-74) 144/74 (08/15 0955) SpO2:  [95 %-99 %] 95 % (08/15 0955)  General - Well nourished, well developed, in no apparent distress.  Ophthalmologic - Fundi not visualized due to small pupils.  Cardiovascular - Regular rate and rhythm.  Mental Status -  Level of arousal and orientation to time, place, and person were intact. Language including expression, naming, repetition, comprehension was assessed and found intact. Fund  of Knowledge was assessed and was intact.  Cranial Nerves II - XII - II - Visual field intact OU. III, IV, VI - Extraocular movements intact. V - Facial sensation intact bilaterally. VII - Facial movement intact bilaterally. VIII - Hearing & vestibular intact bilaterally. X - Palate elevates symmetrically. XI - Chin turning & shoulder shrug intact bilaterally. XII - Tongue protrusion intact.  Motor Strength  - The patient's strength was normal in all extremities except right hand subtle dexterity difficulty and pronator drift was absent.  Bulk was normal and fasciculations were absent.   Motor Tone - Muscle tone was assessed at the neck and appendages and was normal.  Reflexes - The patient's reflexes were 1+ in all extremities and she had no pathological reflexes.  Sensory - Light touch, temperature/pinprick were assessed and were symmetrical.    Coordination - The patient had normal movements in the hands with no ataxia or dysmetria.  Tremor was absent.  Gait and Station - deferred   ASSESSMENT/PLAN [name] is a 66 y.o. female with history of HTN, HLD, pseudomotor cerebri, DM  presenting with headache and weakness of right hand. She did not receive IV t-PA due to out of window   Stroke: acute and subacute small pathcy infarcts in the left MCA distribution, possibly artery to artery embolic due to left ICA stenosis   Resultant  Right hand subtle weakness  MRI head Multiple small areas of acute infarct in the left parietal cortex and left occipital cortex. Numerous chronic white matter infarcts bilaterally  CTA head and neck - Large amount of noncalcified plaque at the proximal left internal carotid artery, causing 60-65% stenosis.   2D Echo  EF 60-65%  CUS - left ICA 40-59% stenosis  Cerebral angio pending  LDL 211  HgbA1c pending  Recommend VVS consultation for possible left CEA  SCDs for VTE prophylaxis Diet heart healthy/carb modified Room service appropriate? Yes; Fluid consistency: Thin Diet NPO time specified Except for: Sips with Meds  aspirin 81 mg daily prior to admission, now on aspirin 325 mg daily. Recommend DAPT for 3 months and then plavix alone according to POINT study.   Patient counseled to be compliant with her antithrombotic medications  Ongoing aggressive stroke risk factor management  Therapy recommendations: None  Disposition:   Pending  Left ICA stenosis  CTA neck - left ICA 60-65%  CUS - left ICA 40-59%  DSA pending  VVS consulted, considering CEA vs. CAS.  Hypertension  Stable Permissive hypertension (OK if < 220/120) but gradually normalize in 5-7 days Long-term BP goal normotensive  Hyperlipidemia  Home meds:  none  LDL 211, goal < 70  Patient not tolerating statins. self-reports leg numbness, chest pain, severe headaches with statins on multiple occassions with multiple providers  Recommend follow up with PCP Dr. Felipa Eth for consideration of PCSK9 inhibitor. Mediation name written to pt.  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 Jaynee Eagles at [facility] day # 2   Rosalin Hawking, MD PhD Stroke Neurology 06/02/2017 4:40 PM    To contact Stroke Continuity provider, please refer to [email or web address] After hours, contact General Neurology

## 2017-06-02 NOTE — Progress Notes (Signed)
Patient refused AM lab drawn. RN attempted to explain the type of lab as best to her knowledge at this time. Patient agreed for phlebotomist to drawn lab with only one attempt. RN reassures patient that we will coordinate POC with updates.    Ave Filter, RN

## 2017-06-02 NOTE — Progress Notes (Signed)
Patient declines plavix at this time verbalizing that she will need to speak with her doctor before taking this medication. Patient voices that one doctor told her he will order it but the other 2 doctors told her they no. Attending MD aware.   Ave Filter, RN

## 2017-06-02 NOTE — Progress Notes (Signed)
Patient will not sign consent for angiogram until speaking with vascular surgeon. Dr.Chen paged again.    Ave Filter, RN

## 2017-06-02 NOTE — Care Management Note (Signed)
Case Management Note  Patient Details  Name: [name] MRN: [id number] Date of Birth: 05-18-1951  Subjective/Objective:    Pt admitted with CVA. She is from home with family.                Action/Plan: No f/u per PT. OT recommending outpatient therapy. CM following for d/c needs, physician orders.  Expected Discharge Date:                  Expected Discharge Plan:  OP Rehab  In-House Referral:     Discharge planning Services  CM Consult  Post Acute Care Choice:    Choice offered to:     DME Arranged:    DME Agency:     HH Arranged:    HH Agency:     Status of Service:  In process, will continue to follow  If discussed at Long Length of Stay Meetings, dates discussed:    Additional Comments:  Pollie Friar, RN 06/02/2017, 2:35 PM

## 2017-06-02 NOTE — Progress Notes (Signed)
Occupational Therapy Treatment Patient Details Name: [name] MRN: [id number] DOB: [birth year] Today's Date: 06/02/2017    History of present illness 66 y.o. female who initially presented to [facility] for assessment of a one week history of right grip strength weakness. MRI was obtained on 05/30/17, revealing acute and subacute infarcts in the left hemisphere (mainly in left MCA distribution; some borderzone between left MCA and left PCA), raising possibility of embolic source; Pt was told to come to ED, to have CVA workup    OT comments  Pt progressing towards OT goals this session. Pt was able to perform toilet transfer, sink level grooming. Session focus was on HEP for RUE. Pt with limited gross motor because of IV pain with elbow flexion.  Handouts provided and reviewed in full for fine motor and theraputty (yellow provided). Pt with no further questions or concerns. Pt reports NO problems with oral care, self-feeding, and writing. OT will continue to work with Pt in acute setting as schedule allows to reinforce HEP (especially theraputty HEP)  Follow Up Recommendations  No OT follow up;Supervision - Intermittent    Equipment Recommendations  None recommended by OT    Recommendations for Other Services      Precautions / Restrictions Precautions Precautions: Fall Restrictions Weight Bearing Restrictions: No       Mobility Bed Mobility Overal bed mobility: Needs Assistance Bed Mobility: Supine to Sit;Sit to Supine     Supine to sit: Supervision;HOB elevated Sit to supine: Supervision   General bed mobility comments: supervision for safety   Transfers Overall transfer level: Needs assistance Equipment used: None Transfers: Sit to/from Stand Sit to Stand: Supervision         General transfer comment: supervision for safety    Balance Overall balance assessment: Needs assistance Sitting-balance support: Feet supported;No upper extremity  supported Sitting balance-Leahy Scale: Good     Standing balance support: No upper extremity supported;During functional activity Standing balance-Leahy Scale: Fair Standing balance comment: able to static stand without physcial assist               High Level Balance Comments: supervision this session           ADL either performed or assessed with clinical judgement   ADL Overall ADL's : Needs assistance/impaired     Grooming: Supervision/safety;Wash/dry hands;Wash/dry face;Standing Grooming Details (indicate cue type and reason): sink level                 Toilet Transfer: Supervision/safety;Ambulation;Comfort height toilet;Grab bars   Toileting- Clothing Manipulation and Hygiene: Modified independent;Sit to/from stand Toileting - Clothing Manipulation Details (indicate cue type and reason): hospital gown and peri care     Functional mobility during ADLs: Supervision/safety       Vision       Perception     Praxis      Cognition Arousal/Alertness: Awake/alert Behavior During Therapy: WFL for tasks assessed/performed Overall Cognitive Status: Within Functional Limits for tasks assessed                                          Exercises Exercises: Other exercises Other Exercises Other Exercises: theraputty provided along with theraputty HEP worksheet, practiced in full until extra objects were required. OT recommended using pony beads, which the Pt was aware of/familiar with   Shoulder Instructions       General Comments  Pt's future daughter in law present for session    Pertinent Vitals/ Pain       Pain Assessment: Faces Faces Pain Scale: Hurts a little bit Pain Location: headache Pain Descriptors / Indicators: Discomfort Pain Intervention(s): Monitored during session;Repositioned  Home Living                                          Prior Functioning/Environment              Frequency  Min  2X/week        Progress Toward Goals  OT Goals(current goals can now be found in the care plan section)  Progress towards OT goals: Progressing toward goals  Acute Rehab OT Goals Patient Stated Goal: return home  OT Goal Formulation: With patient Time For Goal Achievement: 06/15/17 Potential to Achieve Goals: Good  Plan Discharge plan needs to be updated;Frequency remains appropriate    Co-evaluation                 AM-PAC PT "6 Clicks" Daily Activity     Outcome Measure   Help from another person eating meals?: None Help from another person taking care of personal grooming?: A Little Help from another person toileting, which includes using toliet, bedpan, or urinal?: A Little Help from another person bathing (including washing, rinsing, drying)?: A Little Help from another person to put on and taking off regular upper body clothing?: None Help from another person to put on and taking off regular lower body clothing?: A Little 6 Click Score: 20    End of Session Equipment Utilized During Treatment: Gait belt  OT Visit Diagnosis: Muscle weakness (generalized) (M62.81)   Activity Tolerance Patient tolerated treatment well   Patient Left in bed;with bed alarm set;with family/visitor present   Nurse Communication Mobility status        Time: 7782-4235 OT Time Calculation (min): 30 min  Charges: OT General Charges $OT Visit: 1 Procedure OT Treatments $Self Care/Home Management : 8-22 mins $Therapeutic Exercise: 8-22 mins  Hulda Humphrey OTR/L [place] 06/02/2017, 4:10 PM

## 2017-06-02 NOTE — Progress Notes (Signed)
Patient is a 66 year old right-handed female, PMH of DM 2/IDDM, GERD, pseudotumor with chronic intermittent headaches (Dr. Delfin Edis, Neurology), HLD, HTN, seizures, independent but does not drive, who presented to ED upon PCPs advice on 05/31/17 due to right upper extremity weakness. She first noted the symptoms on 05/26/17 when she accompanied her daughter to a physician's appointment. Since then she has had intermittent right upper extremity weakness associated with cold and numb sensation and intermittent headache worse than usual. Denied dysarthria, facial asymmetry or right lower extremity symptoms. Seen by her PCP on 05/28/17 and MRI was ordered. Underwent MRI brain 05/30/17 and received call from PCPs office on day of admission to come to the ED. MRI brain revealed acute and subacute left brain infarcts. Neurology consulted and stroke workup ongoing  I attempted to interview and examine the patient after I had reviewed her case but patient has been refusing lab-work and been combative with nursing staff and refusing care including but not limited to refusing cerebral angiogram, refusing Plavix, and refusing basic bloodwork. I attempted to talk with her this morning briefly about the importance of lab work and why she was in the hospital and attempted to provide care to the patient, but patient became agitated and belligerent and told me to "Get out of the room" and that she "would never want to see me again." Patient would not let me examine her or speak with her anymore. I discussed this with director on call and patient will be re-assigned to a new physician in AM after I called the Flow Manager.   Physical Exam: Would not let me examine Her this AM or Provide Care.   Patient Currently in the Hospital For the following:  Acute Left brain stroke:  -Resultant right upper extremity intermittent weakness and numbness.  -Possibly embolic from left carotid artery stenosis Vs Cardioembolic.  -MRI  brain 05/30/17: Multiple small areas of acute infarct in the left parietal cortex and left occipital cortex. Numerous chronic white matter infarcts bilaterally. CTA head and neck: No emergent large vessel occlusion, large amount of noncalcified plaque at the proximal left ICA, causing 60-65 percent stenosis and this is the most likely source of emboli, no other intracranial or cervical artery stenosis.  -TTE: LVEF 60-65% and grade 1 diastolic dysfunction. ? TEE.  -Carotid Dopplers: 1-39 percent right ICA stenosis and 40-59 percent left ICA stenosis.  -Vertebral arteries are patent with antegrade flow.  -LDL an A1c pending (patient refused labs this morning).  -Neurology was consulted. Appreciated Recc's -Patient was on aspirin 81 MG daily prior to admission which has been increased to 325 MG daily by stroke M.D. -Dr. Erlinda Hong added Clopidogrel 75 mg po Daily and patient has refused.   -Neurology recommends vascular surgery consultation for symptomatic left ICA stenosis.  -Outpatient OT. No PT follow-up. -Refusing Cerebral Angiogram  Possible Symptomatic left ICA stenosis:  -Neurology have consulted vascular surgery.  -Vascular Surgery wanting to do Cerebral Angiogram but patient refusing to even sign consent.   Essential hypertension:  -Allow permissive hypertension for 5-7 days due to recent acute stroke.  -It has been about that time since beginning of symptoms.  -Continue home metoprolol dose. 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 Hold glipizide.  -Adjust Lantus and NovoLog SSI as needed >increase Lantus to home dose 32 units and change sliding scale to moderate with HS scale.  -Uncontrolled as indicated  below. Continue Gabapentin.  -C/w Domperidone and Protonix   Hyperlipidemia:  -Lipid Panel showed Cholesterol of 297, HDL of 49, LDL of 211, TG of 184, VLDL of 37  Pseudotumor  Cerebri:  -Follows with outpatient neurology and has intermittent headaches.  -Status post LP shunt.  -On Acetazolamide as an outpatient   Anemia:  -Hb/Hct went from 11.9/36.6 -> 12.9/38.0 -Has not allowed [place] to get another CBC  Mild Hyponatremia:  -Pseudohyponatremia from hyperglycemia. -Has not allowed [place] to repeat CMP's  Near syncope:  -Reported by patient and family when she was in radiology to have a MRI on 05/30/17.? Etiology. -? Related to acute stroke versus left carotid stenosis.

## 2017-06-02 NOTE — Progress Notes (Signed)
Cerebral angiogram consent unable to be complete due to patient refusal of procedure. Dr Bridgett Larsson paged.  Ave Filter, RN

## 2017-06-03 ENCOUNTER — Inpatient Hospital Stay (HOSPITAL_COMMUNITY): Payer: Medicare Other

## 2017-06-03 ENCOUNTER — Encounter (HOSPITAL_COMMUNITY)
Admission: EM | Disposition: A | Discharge status: Rehabilitation Facility | Payer: Self-pay | Source: Home / Self Care | Attending: Pulmonary Disease

## 2017-06-03 ENCOUNTER — Other Ambulatory Visit (HOSPITAL_COMMUNITY): Payer: Self-pay | Admitting: Radiology

## 2017-06-03 ENCOUNTER — Inpatient Hospital Stay (HOSPITAL_COMMUNITY): Payer: Medicare Other | Admitting: Certified Registered"

## 2017-06-03 ENCOUNTER — Encounter (HOSPITAL_COMMUNITY): Payer: Self-pay | Admitting: Radiology

## 2017-06-03 DIAGNOSIS — I639 Cerebral infarction, unspecified: Secondary | ICD-10-CM

## 2017-06-03 DIAGNOSIS — I6522 Occlusion and stenosis of left carotid artery: Secondary | ICD-10-CM

## 2017-06-03 DIAGNOSIS — I6300 Cerebral infarction due to thrombosis of unspecified precerebral artery: Secondary | ICD-10-CM

## 2017-06-03 DIAGNOSIS — I63313 Cerebral infarction due to thrombosis of bilateral middle cerebral arteries: Secondary | ICD-10-CM

## 2017-06-03 HISTORY — PX: IR PERCUTANEOUS ART THROMBECTOMY/INFUSION INTRACRANIAL INC DIAG ANGIO: IMG6087

## 2017-06-03 HISTORY — PX: IR ANGIO VERTEBRAL SEL SUBCLAVIAN INNOMINATE UNI R MOD SED: IMG5365

## 2017-06-03 HISTORY — PX: RADIOLOGY WITH ANESTHESIA: SHX6223

## 2017-06-03 HISTORY — PX: CAROTID ANGIOGRAPHY: CATH118230

## 2017-06-03 LAB — CBC
HEMATOCRIT: 30.5 % — AB (ref 36.0–46.0)
HEMOGLOBIN: 9.8 g/dL — AB (ref 12.0–15.0)
MCH: 27.2 pg (ref 26.0–34.0)
MCHC: 32.1 g/dL (ref 30.0–36.0)
MCV: 84.7 fL (ref 78.0–100.0)
Platelets: 322 10*3/uL (ref 150–400)
RBC: 3.6 MIL/uL — AB (ref 3.87–5.11)
RDW: 14.1 % (ref 11.5–15.5)
WBC: 12.7 10*3/uL — AB (ref 4.0–10.5)

## 2017-06-03 LAB — BASIC METABOLIC PANEL
Anion gap: 11 (ref 5–15)
BUN: 23 mg/dL — AB (ref 6–20)
CO2: 23 mmol/L (ref 22–32)
CREATININE: 1 mg/dL (ref 0.44–1.00)
Calcium: 9.6 mg/dL (ref 8.9–10.3)
Chloride: 103 mmol/L (ref 101–111)
GFR, EST NON AFRICAN AMERICAN: 58 mL/min — AB (ref >60)
Glucose, Bld: 165 mg/dL — ABNORMAL HIGH (ref 65–99)
Potassium: 4.4 mmol/L (ref 3.5–5.1)
SODIUM: 137 mmol/L (ref 135–145)

## 2017-06-03 LAB — GLUCOSE, CAPILLARY
GLUCOSE-CAPILLARY: 178 mg/dL — AB (ref 65–99)
GLUCOSE-CAPILLARY: 101 mg/dL — AB (ref 65–99)
GLUCOSE-CAPILLARY: 173 mg/dL — AB (ref 65–99)
GLUCOSE-CAPILLARY: 198 mg/dL — AB (ref 65–99)

## 2017-06-03 LAB — BLOOD GAS, ARTERIAL
ACID-BASE DEFICIT: 3.4 mmol/L — AB (ref 0.0–2.0)
Bicarbonate: 20.2 mmol/L (ref 20.0–28.0)
DRAWN BY: 33176
FIO2: 50
MECHVT: 440 mL
O2 SAT: 99.5 %
PEEP/CPAP: 5 cmH2O
PH ART: 7.431 (ref 7.350–7.450)
Patient temperature: 98.6
RATE: 16 resp/min
pCO2 arterial: 31 mmHg — ABNORMAL LOW (ref 32.0–48.0)
pO2, Arterial: 206 mmHg — ABNORMAL HIGH (ref 83.0–108.0)

## 2017-06-03 LAB — CREATININE, SERUM
Creatinine, Ser: 1.01 mg/dL — ABNORMAL HIGH (ref 0.44–1.00)
GFR calc non Af Amer: 57 mL/min — ABNORMAL LOW (ref >60)

## 2017-06-03 LAB — PHOSPHORUS
Phosphorus: 3.1 mg/dL (ref 2.5–4.6)
Phosphorus: 4.5 mg/dL (ref 2.5–4.6)

## 2017-06-03 LAB — HEMOGLOBIN A1C
Hgb A1c MFr Bld: 9.5 % — ABNORMAL HIGH (ref 4.8–5.6)
Mean Plasma Glucose: 226 mg/dL

## 2017-06-03 LAB — PLATELET INHIBITION P2Y12: PLATELET FUNCTION P2Y12: 271 [PRU] (ref 194–418)

## 2017-06-03 LAB — MAGNESIUM
MAGNESIUM: 1.5 mg/dL — AB (ref 1.7–2.4)
Magnesium: 1.4 mg/dL — ABNORMAL LOW (ref 1.7–2.4)

## 2017-06-03 SURGERY — RADIOLOGY WITH ANESTHESIA
Anesthesia: General

## 2017-06-03 SURGERY — CAROTID ANGIOGRAPHY
Anesthesia: LOCAL | Laterality: Bilateral

## 2017-06-03 MED ORDER — ACETAMINOPHEN 160 MG/5ML PO SOLN
650.0000 mg | ORAL | Status: DC | PRN
  Administered 2017-06-08 – 2017-06-15 (×10): 650 mg
  Filled 2017-06-03 (×10): qty 20.3

## 2017-06-03 MED ORDER — ONDANSETRON HCL 4 MG/2ML IJ SOLN
4.0000 mg | Freq: Once | INTRAMUSCULAR | Status: AC
  Administered 2017-06-03: 4 mg via INTRAVENOUS
  Filled 2017-06-03: qty 2

## 2017-06-03 MED ORDER — ONDANSETRON HCL 4 MG/2ML IJ SOLN: 4.0000 mg | Freq: Four times a day (QID) | INTRAMUSCULAR | Status: DC | PRN

## 2017-06-03 MED ORDER — MIDAZOLAM HCL 2 MG/2ML IJ SOLN
1.0000 mg | INTRAMUSCULAR | Status: DC | PRN
  Administered 2017-06-03 – 2017-06-07 (×4): 1 mg via INTRAVENOUS
  Filled 2017-06-03 (×3): qty 2

## 2017-06-03 MED ORDER — ONDANSETRON HCL 4 MG/2ML IJ SOLN
4.0000 mg | Freq: Four times a day (QID) | INTRAMUSCULAR | Status: DC | PRN
Start: 2017-06-03 — End: 2017-06-07
  Administered 2017-06-04 – 2017-06-05 (×2): 4 mg via INTRAVENOUS
  Filled 2017-06-03 (×2): qty 2

## 2017-06-03 MED ORDER — CLEVIDIPINE BUTYRATE 0.5 MG/ML IV EMUL
0.0000 mg/h | INTRAVENOUS | Status: DC
  Administered 2017-06-03: 1 mg/h via INTRAVENOUS
  Administered 2017-06-03: 5 mg/h via INTRAVENOUS
  Filled 2017-06-03 (×3): qty 50

## 2017-06-03 MED ORDER — ACETAMINOPHEN 650 MG RE SUPP: 650.0000 mg | RECTAL | Status: DC | PRN

## 2017-06-03 MED ORDER — IODIXANOL 320 MG/ML IV SOLN
INTRAVENOUS | Status: DC | PRN
  Administered 2017-06-03: 91 mL via INTRA_ARTERIAL

## 2017-06-03 MED ORDER — FENTANYL CITRATE (PF) 100 MCG/2ML IJ SOLN
50.0000 ug | INTRAMUSCULAR | Status: AC | PRN
  Administered 2017-06-03 (×3): 50 ug via INTRAVENOUS
  Filled 2017-06-03 (×2): qty 2

## 2017-06-03 MED ORDER — ASPIRIN 325 MG PO TABS
ORAL_TABLET | ORAL | Status: AC
  Filled 2017-06-03: qty 1

## 2017-06-03 MED ORDER — FAMOTIDINE IN NACL 20-0.9 MG/50ML-% IV SOLN
20.0000 mg | INTRAVENOUS | Status: DC
  Administered 2017-06-03 – 2017-06-17 (×15): 20 mg via INTRAVENOUS
  Filled 2017-06-03 (×15): qty 50

## 2017-06-03 MED ORDER — ENOXAPARIN SODIUM 40 MG/0.4ML ~~LOC~~ SOLN
40.0000 mg | SUBCUTANEOUS | Status: DC
  Administered 2017-06-04 – 2017-06-18 (×14): 40 mg via SUBCUTANEOUS
  Filled 2017-06-03 (×16): qty 0.4

## 2017-06-03 MED ORDER — LACTATED RINGERS IV SOLN
INTRAVENOUS | Status: DC | PRN
  Administered 2017-06-03: 11:00:00 via INTRAVENOUS

## 2017-06-03 MED ORDER — FENTANYL CITRATE (PF) 100 MCG/2ML IJ SOLN: 50.0000 ug | Freq: Once | INTRAMUSCULAR | Status: AC

## 2017-06-03 MED ORDER — ORAL CARE MOUTH RINSE
15.0000 mL | OROMUCOSAL | Status: DC
  Administered 2017-06-03 – 2017-06-10 (×70): 15 mL via OROMUCOSAL

## 2017-06-03 MED ORDER — LORAZEPAM 2 MG/ML IJ SOLN
INTRAMUSCULAR | Status: AC
  Filled 2017-06-03: qty 1

## 2017-06-03 MED ORDER — SUCCINYLCHOLINE CHLORIDE 20 MG/ML IJ SOLN
INTRAMUSCULAR | Status: DC | PRN
  Administered 2017-06-03: 120 mg via INTRAVENOUS

## 2017-06-03 MED ORDER — CLOPIDOGREL BISULFATE 75 MG PO TABS
ORAL_TABLET | ORAL | Status: AC | PRN
  Administered 2017-06-03: 300 mg via NASOGASTRIC

## 2017-06-03 MED ORDER — GLYCOPYRROLATE 0.2 MG/ML IJ SOLN
INTRAMUSCULAR | Status: DC | PRN
  Administered 2017-06-03: 0.2 mg via INTRAVENOUS

## 2017-06-03 MED ORDER — LABETALOL HCL 5 MG/ML IV SOLN
10.0000 mg | INTRAVENOUS | Status: AC | PRN
  Administered 2017-06-05 – 2017-06-07 (×4): 10 mg via INTRAVENOUS
  Filled 2017-06-03 (×4): qty 4

## 2017-06-03 MED ORDER — ROCURONIUM BROMIDE 100 MG/10ML IV SOLN
INTRAVENOUS | Status: DC | PRN
  Administered 2017-06-03 (×2): 20 mg via INTRAVENOUS
  Administered 2017-06-03: 40 mg via INTRAVENOUS

## 2017-06-03 MED ORDER — GABAPENTIN 250 MG/5ML PO SOLN
100.0000 mg | Freq: Every day | ORAL | Status: DC
  Administered 2017-06-03: 100 mg
  Filled 2017-06-03 (×2): qty 2

## 2017-06-03 MED ORDER — FENTANYL CITRATE (PF) 100 MCG/2ML IJ SOLN
INTRAMUSCULAR | Status: AC
  Filled 2017-06-03: qty 2

## 2017-06-03 MED ORDER — SODIUM CHLORIDE 0.9 % IV SOLN: 250.0000 mL | INTRAVENOUS | Status: DC | PRN

## 2017-06-03 MED ORDER — FENTANYL CITRATE (PF) 100 MCG/2ML IJ SOLN
INTRAMUSCULAR | Status: DC | PRN
  Administered 2017-06-03 (×2): 100 ug via INTRAVENOUS

## 2017-06-03 MED ORDER — PHENYLEPHRINE HCL 10 MG/ML IJ SOLN
INTRAVENOUS | Status: DC | PRN
  Administered 2017-06-03: 20 ug/min via INTRAVENOUS

## 2017-06-03 MED ORDER — SODIUM CHLORIDE 0.9% FLUSH: 3.0000 mL | Freq: Two times a day (BID) | INTRAVENOUS | Status: DC

## 2017-06-03 MED ORDER — HEPARIN (PORCINE) IN NACL 2-0.9 UNIT/ML-% IJ SOLN
INTRAMUSCULAR | Status: AC
  Filled 2017-06-03: qty 1000

## 2017-06-03 MED ORDER — VITAL HIGH PROTEIN PO LIQD
1000.0000 mL | ORAL | Status: DC
  Administered 2017-06-03 (×2)
  Administered 2017-06-03: 1000 mL

## 2017-06-03 MED ORDER — ASPIRIN 325 MG PO TABS
325.0000 mg | ORAL_TABLET | Freq: Every day | ORAL | Status: DC
  Administered 2017-06-04 – 2017-06-18 (×15): 325 mg
  Filled 2017-06-03 (×17): qty 1

## 2017-06-03 MED ORDER — MIDAZOLAM HCL 2 MG/2ML IJ SOLN
1.0000 mg | INTRAMUSCULAR | Status: DC | PRN
  Filled 2017-06-03: qty 2

## 2017-06-03 MED ORDER — LIDOCAINE HCL (CARDIAC) 20 MG/ML IV SOLN
INTRAVENOUS | Status: DC | PRN
  Administered 2017-06-03: 60 mg via INTRAVENOUS

## 2017-06-03 MED ORDER — PRO-STAT SUGAR FREE PO LIQD
30.0000 mL | Freq: Two times a day (BID) | ORAL | Status: DC
  Administered 2017-06-03: 30 mL
  Filled 2017-06-03: qty 30

## 2017-06-03 MED ORDER — CLOPIDOGREL BISULFATE 75 MG PO TABS
75.0000 mg | ORAL_TABLET | Freq: Every day | ORAL | Status: DC
  Administered 2017-06-04 – 2017-06-18 (×15): 75 mg
  Filled 2017-06-03 (×18): qty 1

## 2017-06-03 MED ORDER — ACETAMINOPHEN 325 MG PO TABS
650.0000 mg | ORAL_TABLET | ORAL | Status: DC | PRN
  Filled 2017-06-03 (×2): qty 2

## 2017-06-03 MED ORDER — IOPAMIDOL (ISOVUE-300) INJECTION 61%
INTRAVENOUS | Status: AC
  Administered 2017-06-03: 125 mL
  Filled 2017-06-03: qty 300

## 2017-06-03 MED ORDER — FENTANYL BOLUS VIA INFUSION
25.0000 ug | INTRAVENOUS | Status: DC | PRN
  Administered 2017-06-06 (×3): 25 ug via INTRAVENOUS
  Filled 2017-06-03: qty 25

## 2017-06-03 MED ORDER — CLOPIDOGREL BISULFATE 300 MG PO TABS
ORAL_TABLET | ORAL | Status: AC
  Filled 2017-06-03: qty 1

## 2017-06-03 MED ORDER — VANCOMYCIN HCL 1000 MG IV SOLR
INTRAVENOUS | Status: DC | PRN
  Administered 2017-06-03: 1000 mg via INTRAVENOUS

## 2017-06-03 MED ORDER — ASPIRIN 325 MG PO TABS: 325.0000 mg | ORAL_TABLET | Freq: Every day | ORAL | Status: DC

## 2017-06-03 MED ORDER — ASPIRIN 325 MG PO TABS
ORAL_TABLET | ORAL | Status: AC | PRN
  Administered 2017-06-03: 325 mg via NASOGASTRIC

## 2017-06-03 MED ORDER — SODIUM CHLORIDE 0.9 % WEIGHT BASED INFUSION: 1.0000 mL/kg/h | INTRAVENOUS | Status: DC

## 2017-06-03 MED ORDER — EPTIFIBATIDE 20 MG/10ML IV SOLN
INTRAVENOUS | Status: AC
  Filled 2017-06-03: qty 10

## 2017-06-03 MED ORDER — IOPAMIDOL (ISOVUE-370) INJECTION 76%
INTRAVENOUS | Status: AC
  Administered 2017-06-03: 50 mL
  Filled 2017-06-03: qty 50

## 2017-06-03 MED ORDER — SODIUM CHLORIDE 0.9 % IV SOLN
INTRAVENOUS | Status: DC
  Administered 2017-06-03: 14:00:00 via INTRAVENOUS

## 2017-06-03 MED ORDER — FENTANYL CITRATE (PF) 100 MCG/2ML IJ SOLN
50.0000 ug | INTRAMUSCULAR | Status: DC | PRN
  Administered 2017-06-03 – 2017-06-07 (×5): 50 ug via INTRAVENOUS
  Filled 2017-06-03 (×6): qty 2

## 2017-06-03 MED ORDER — PROPOFOL 500 MG/50ML IV EMUL
INTRAVENOUS | Status: DC | PRN
  Administered 2017-06-03: 75 ug/kg/min via INTRAVENOUS

## 2017-06-03 MED ORDER — SODIUM CHLORIDE 0.9 % IJ SOLN
INTRAVENOUS | Status: AC | PRN
  Administered 2017-06-03 (×4): 25 ug via INTRA_ARTERIAL

## 2017-06-03 MED ORDER — NITROGLYCERIN 1 MG/10 ML FOR IR/CATH LAB
INTRA_ARTERIAL | Status: AC
  Filled 2017-06-03: qty 10

## 2017-06-03 MED ORDER — CHLORHEXIDINE GLUCONATE 0.12% ORAL RINSE (MEDLINE KIT)
15.0000 mL | Freq: Two times a day (BID) | OROMUCOSAL | Status: DC
  Administered 2017-06-03 – 2017-06-10 (×15): 15 mL via OROMUCOSAL

## 2017-06-03 MED ORDER — HEPARIN (PORCINE) IN NACL 2-0.9 UNIT/ML-% IJ SOLN
INTRAMUSCULAR | Status: AC | PRN
  Administered 2017-06-03: 1000 mL

## 2017-06-03 MED ORDER — LIDOCAINE HCL (PF) 1 % IJ SOLN
INTRAMUSCULAR | Status: DC | PRN
  Administered 2017-06-03: 12 mL

## 2017-06-03 MED ORDER — LIDOCAINE HCL (PF) 1 % IJ SOLN
INTRAMUSCULAR | Status: AC
  Filled 2017-06-03: qty 30

## 2017-06-03 MED ORDER — SODIUM CHLORIDE 0.9% FLUSH: 3.0000 mL | INTRAVENOUS | Status: DC | PRN

## 2017-06-03 MED ORDER — FENTANYL 2500MCG IN NS 250ML (10MCG/ML) PREMIX INFUSION
25.0000 ug/h | INTRAVENOUS | Status: DC
  Administered 2017-06-03: 50 ug/h via INTRAVENOUS
  Administered 2017-06-04: 75 ug/h via INTRAVENOUS
  Administered 2017-06-05: 100 ug/h via INTRAVENOUS
  Filled 2017-06-03 (×3): qty 250

## 2017-06-03 MED ORDER — ONDANSETRON HCL 4 MG/2ML IJ SOLN
INTRAMUSCULAR | Status: DC | PRN
  Administered 2017-06-03: 4 mg via INTRAVENOUS

## 2017-06-03 MED ORDER — CLOPIDOGREL BISULFATE 75 MG PO TABS: 75.0000 mg | ORAL_TABLET | Freq: Every day | ORAL | Status: DC

## 2017-06-03 MED ORDER — INSULIN ASPART 100 UNIT/ML ~~LOC~~ SOLN
0.0000 [IU] | SUBCUTANEOUS | Status: DC
  Administered 2017-06-03: 3 [IU] via SUBCUTANEOUS
  Administered 2017-06-04: 8 [IU] via SUBCUTANEOUS
  Administered 2017-06-04 – 2017-06-05 (×3): 2 [IU] via SUBCUTANEOUS
  Administered 2017-06-06: 3 [IU] via SUBCUTANEOUS
  Administered 2017-06-06 – 2017-06-07 (×4): 2 [IU] via SUBCUTANEOUS
  Administered 2017-06-07 (×3): 3 [IU] via SUBCUTANEOUS
  Administered 2017-06-07 – 2017-06-08 (×2): 8 [IU] via SUBCUTANEOUS
  Administered 2017-06-08: 3 [IU] via SUBCUTANEOUS
  Administered 2017-06-08: 5 [IU] via SUBCUTANEOUS
  Administered 2017-06-08: 2 [IU] via SUBCUTANEOUS
  Administered 2017-06-08: 3 [IU] via SUBCUTANEOUS
  Administered 2017-06-08: 8 [IU] via SUBCUTANEOUS
  Administered 2017-06-09: 3 [IU] via SUBCUTANEOUS
  Administered 2017-06-09: 2 [IU] via SUBCUTANEOUS
  Administered 2017-06-09: 5 [IU] via SUBCUTANEOUS
  Administered 2017-06-09 (×4): 3 [IU] via SUBCUTANEOUS
  Administered 2017-06-10: 8 [IU] via SUBCUTANEOUS
  Administered 2017-06-10: 3 [IU] via SUBCUTANEOUS
  Administered 2017-06-10: 2 [IU] via SUBCUTANEOUS
  Administered 2017-06-11 (×3): 5 [IU] via SUBCUTANEOUS
  Administered 2017-06-11: 3 [IU] via SUBCUTANEOUS
  Administered 2017-06-11: 2 [IU] via SUBCUTANEOUS
  Administered 2017-06-12: 3 [IU] via SUBCUTANEOUS
  Administered 2017-06-12: 8 [IU] via SUBCUTANEOUS
  Administered 2017-06-12: 5 [IU] via SUBCUTANEOUS
  Administered 2017-06-12: 11 [IU] via SUBCUTANEOUS
  Administered 2017-06-13: 3 [IU] via SUBCUTANEOUS
  Administered 2017-06-13: 8 [IU] via SUBCUTANEOUS
  Administered 2017-06-13: 2 [IU] via SUBCUTANEOUS
  Administered 2017-06-13: 3 [IU] via SUBCUTANEOUS
  Administered 2017-06-13 – 2017-06-14 (×2): 2 [IU] via SUBCUTANEOUS
  Administered 2017-06-14: 8 [IU] via SUBCUTANEOUS
  Administered 2017-06-14: 3 [IU] via SUBCUTANEOUS
  Administered 2017-06-14 – 2017-06-15 (×3): 5 [IU] via SUBCUTANEOUS
  Administered 2017-06-15 (×3): 3 [IU] via SUBCUTANEOUS
  Administered 2017-06-16: 10:00:00 via SUBCUTANEOUS
  Administered 2017-06-16: 2 [IU] via SUBCUTANEOUS
  Administered 2017-06-16 (×2): 5 [IU] via SUBCUTANEOUS
  Administered 2017-06-16 – 2017-06-17 (×3): 3 [IU] via SUBCUTANEOUS
  Administered 2017-06-17: 8 [IU] via SUBCUTANEOUS
  Administered 2017-06-17 (×2): 3 [IU] via SUBCUTANEOUS
  Administered 2017-06-18: 2 [IU] via SUBCUTANEOUS
  Administered 2017-06-18: 3 [IU] via SUBCUTANEOUS
  Administered 2017-06-18: 2 [IU] via SUBCUTANEOUS
  Administered 2017-06-18: 4 [IU] via SUBCUTANEOUS
  Administered 2017-06-18: 3 [IU] via SUBCUTANEOUS

## 2017-06-03 MED ORDER — SODIUM CHLORIDE 0.9 % IV SOLN: INTRAVENOUS | Status: DC

## 2017-06-03 MED ORDER — METOPROLOL TARTRATE 50 MG PO TABS
50.0000 mg | ORAL_TABLET | Freq: Every day | ORAL | Status: DC
  Administered 2017-06-03 – 2017-06-08 (×5): 50 mg
  Filled 2017-06-03 (×5): qty 1

## 2017-06-03 MED ORDER — PROPOFOL 10 MG/ML IV BOLUS
INTRAVENOUS | Status: DC | PRN
  Administered 2017-06-03: 150 mg via INTRAVENOUS

## 2017-06-03 MED ORDER — VANCOMYCIN HCL IN DEXTROSE 1-5 GM/200ML-% IV SOLN
INTRAVENOUS | Status: AC
  Filled 2017-06-03: qty 200

## 2017-06-03 MED ORDER — HYDRALAZINE HCL 20 MG/ML IJ SOLN
5.0000 mg | INTRAMUSCULAR | Status: AC | PRN
  Administered 2017-06-05 (×2): 5 mg via INTRAVENOUS
  Filled 2017-06-03 (×2): qty 1

## 2017-06-03 SURGICAL SUPPLY — 12 items
CATH ANGIO 5F BER2 100CM (CATHETERS) ×1 IMPLANT
CATH ANGIO 5F PIGTAIL 100CM (CATHETERS) ×1 IMPLANT
COVER PRB 48X5XTLSCP FOLD TPE (BAG) IMPLANT
COVER PROBE 5X48 (BAG) ×2
KIT MICROINTRODUCER STIFF 5F (SHEATH) ×1 IMPLANT
KIT PV (KITS) ×2 IMPLANT
SHEATH PINNACLE 5F 10CM (SHEATH) ×1 IMPLANT
STOPCOCK MORSE 400PSI 3WAY (MISCELLANEOUS) ×1 IMPLANT
SYR MEDRAD MARK V 150ML (SYRINGE) ×2 IMPLANT
TRANSDUCER W/STOPCOCK (MISCELLANEOUS) ×2 IMPLANT
TRAY PV CATH (CUSTOM PROCEDURE TRAY) ×2 IMPLANT
WIRE BENTSON .035X145CM (WIRE) ×1 IMPLANT

## 2017-06-03 NOTE — Progress Notes (Signed)
   Daily Progress Note   B carotid and cerebral angiogram completed without any difficulty.  Mid-procedure patient became unresponsive with exam not consistent with stroke.  By end of case, her exam was more consistent with seizure, which that patient is known to have.    Her right carotid system has minimal disease and her left internal carotid has <50% stenosis, so no intervention is indicated per NASCET.  - Stroke team is at bedside and work-up in progress - Pt will be transferred to 4N while work-up is in progress   Adele Barthel, MD, FACS [facility]: [phone] Pager: [phone]  06/03/2017, 10:41 AM

## 2017-06-03 NOTE — Progress Notes (Signed)
Pt refused to wear SCD's this morning.  Educated.  Continues to refuse.

## 2017-06-03 NOTE — Anesthesia Postprocedure Evaluation (Signed)
Anesthesia Post Note  Patient: [name]  Procedure(s) Performed: Procedure(s) (LRB): RADIOLOGY WITH ANESTHESIA (N/A)     Patient location during evaluation: SICU Anesthesia Type: General Level of consciousness: sedated Pain management: pain level controlled Vital Signs Assessment: post-procedure vital signs reviewed and stable Respiratory status: patient remains intubated per anesthesia plan Cardiovascular status: stable Anesthetic complications: no    Last Vitals:  Vitals:   06/03/17 1500 06/03/17 1515  BP: (!) 136/103   Pulse: 64 63  Resp: 16 18  Temp:    SpO2: 100% 100%    Last Pain:  Vitals:   06/03/17 0947  TempSrc:   PainSc: [place]

## 2017-06-03 NOTE — Procedures (Signed)
Date of recording 06/03/2017  Referring physician Rosalin Hawking  Reason for the study 66 year old female with acute right hemispheric stroke status post thrombectomy, intubated.  Technical Distal EEG recording using 10-20 international electrode system.  Description of the recording Posterior dominant rhythm which is between 5-8 Hz symmetrical reactive and not well sustained. During the recording patient had intermittent left arm twitching and then later right arm twitching, EEG correlates generalized muscle artifact without any epileptiform activity.   Impression This EEG is abnormal and findings are suggestive of mild generalized cerebral dysfunction. Intermittent arm twitching was not associated with any epileptiform activity.

## 2017-06-03 NOTE — Progress Notes (Signed)
STROKE TEAM PROGRESS NOTE   SUBJECTIVE (INTERVAL HISTORY) Pt had cerebral angio with Dr. Bridgett Larsson this morning showing < 50% R ICA stenosis.  During the procedure, pt had sudden-onset decline in mental status, became unresponsive, had roving eye movements, dilated and reactive pupils, bilateral upper-extremity tonic posturing, diaphoresis, withdrawing to pain on both LEs.  Pt taken immediately for stat CT head and CTA head/neck.  CTA (06/03/2017) identified multiple thrombi in bilateral M1 segments with some preserved distal flow.  Known R M1 stenosis prior to acute event.  Consent obtained from daughter for emergent mechanical thrombectomy with Dr. Estanislado Pandy.  Patient intubated without event, sedated with versed and fentanyl, PRVC 16 at [place].  Pt now s/p complete revascularization of bilateral M1 segments, with TICI3 reperfusion bilaterally.  Pt admitted to Neuro ICU.  EEG complete, report pending.  Completing a post-procedure heparin infusion.  IR sheath in place.   OBJECTIVE Temp:  [97.3 F (36.3 C)-98.1 F (36.7 C)] 97.3 F (36.3 C) (08/16 1600) Pulse Rate:  [53-94] 93 (08/16 1700) Cardiac Rhythm: Normal sinus rhythm (08/16 1700) Resp:  [10-24] 14 (08/16 1700) BP: (97-189)/(49-124) 109/57 (08/16 1630) SpO2:  [94 %-100 %] 100 % (08/16 1700) Arterial Line BP: (111-204)/(56-92) 148/77 (08/16 1700) FiO2 (%):  [50 %] 50 % (08/16 1605)  CBC:   Recent Labs Lab 05/31/17 1627 05/31/17 1647 06/03/17 1352  WBC 11.1*  --  12.7*  NEUTROABS 8.3*  --   --   HGB 11.9* 12.9 9.8*  HCT 36.6 38.0 30.5*  MCV 85.1  --  84.7  PLT 346  --  494    Basic Metabolic Panel:   Recent Labs Lab 05/31/17 1627 05/31/17 1647 06/03/17 0537 06/03/17 1352 06/03/17 1424  NA 133* 134* 137  --   --   K 4.5 4.5 4.4  --   --   CL 101 102 103  --   --   CO2 23  --  23  --   --   GLUCOSE 297* 306* 165*  --   --   BUN 21* 25* 23*  --   --   CREATININE 1.18* 1.20* 1.00 1.01*  --   CALCIUM 9.4  --  9.6  --   --    MG  --   --   --   --  1.5*  PHOS  --   --   --   --  3.1    Lipid Panel:     Component Value Date/Time   CHOL 297 (H) 06/02/2017 0741   TRIG 184 (H) 06/02/2017 0741   HDL 49 06/02/2017 0741   CHOLHDL 6.1 06/02/2017 0741   VLDL 37 06/02/2017 0741   LDLCALC 211 (H) 06/02/2017 0741   HgbA1c:  Lab Results  Component Value Date   HGBA1C 9.5 (H) 06/02/2017   Urine Drug Screen: No results found for: LABOPIA, COCAINSCRNUR, LABBENZ, AMPHETMU, THCU, LABBARB  Alcohol Level No results found for: [place] I have personally reviewed the radiological images below and agree with the radiology interpretations.  Ct Angio Head and neck W Or Wo Contrast 05/31/2017 IMPRESSION: 1. No emergent large vessel occlusion. 2. Large amount of noncalcified plaque at the proximal left internal carotid artery, causing 60-65% stenosis. This is the most likely source of emboli for the previously identified left parietal and occipital infarcts. 3. No other intracranial or cervical arterial stenosis. 4. Minimal Aortic Atherosclerosis (ICD10-I70.0).   Mr Jeri Cos Wo Contrast 05/30/2017 IMPRESSION: Multiple small areas of acute  infarct in the left parietal cortex and left occipital cortex. Recommend CTA head and neck to evaluate for source of emboli or stenosis causing these infarcts Numerous chronic white matter infarcts bilaterally   CUS 06/01/2017 1-39% right internal carotid artery stenosis and 40-59% left internal carotid artery stenosis. Vertebral arteries are patent with antegrade flow.  TTE 8/114/2018  - Left ventricle: The cavity size was normal. Systolic function was normal. The estimated ejection fraction was in the range of 60% to 65%. Wall motion was normal; there were no regional wall motion abnormalities. Doppler parameters are consistent with abnormal left ventricular relaxation (grade 1 diastolic dysfunction).  Carotid Angiography 06/03/2017 FINDING(S):  Type I aortic arch: widely  patent  Innominate artery: widely patent ? R common carotid artery: widely patent ? R internal carotid artery: minimal disease, diffuse atherosclerotic plaque suggested ? R external carotid artery: minimal disease, diffuse atherosclerotic plaque suggested ? R subclavian artery: patent ? R vertebral artery: patent proximally  Left common carotid artery: patent ? L internal carotid artery: patent, 43% stenosis by NASCET proximally ? L external carotid artery: patent  Left subclavian artery: patent ? Left vertebral artery: patent  Intracranial findings to be read by Neuroradiology  CT Head Code Stroke  06/03/2017 IMPRESSION: 1. No acute finding or change from 3 days ago. 2. 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 Chronic small vessel ischemic changes elsewhere. are stable  CTA Head/Neck 06/03/2017 IMPRESSION: 1. Right M1 embolus with subtotal occlusion but severe stenosis. The anterior temporal branch is covered and non-opacified.  2. Elongated embolus within the left M1 and proximal M2 vessels. 3. Negative for dissection or interval ulceration in the arch and cervical carotids. 4. Carotid bifurcation atherosclerosis with approximately 50-60% left ICA bulb narrowing. Visually the stenosis appears worse due to positive remodeling at the low-density plaque. 5. Moderate atheromatous narrowing of the right V4 and mid basilar.  CT Head WO Contrast 06/03/2017 IMPRESSION: 1. Several small foci of enhancement within bilateral parietal and left frontal cortex as well as left basal ganglia probably representing enhancing infarctions. 2. No large hemorrhage, focal mass effect, herniation, or hydrocephalus.  DG Chest Port 1 View 06/03/2017 IMPRESSION: 1. The ETT terminates 1.5 cm above the carina. Recommend withdrawing 1 cm. 2. New infiltrate in the lateral left lung base is nonspecific.  Recommend attention on follow-up.  DSA 06/03/2017 S/P  bilateral common carotid arteriogram,and Rt vert angiogram,followed by complete revascularization of occluded  MCAs M1 segments bilaterally, with x 1 pass with embotrap 58mmx 35 mm  Retrieval device achieving a TICI3 reperfusion bilaterally.  EEG 06/03/2017 pending    PHYSICAL EXAM  Temp:  [97.3 F (36.3 C)-98.1 F (36.7 C)] 97.3 F (36.3 C) (08/16 1600) Pulse Rate:  [53-94] 93 (08/16 1700) Resp:  [10-24] 14 (08/16 1700) BP: (97-189)/(49-124) 109/57 (08/16 1630) SpO2:  [94 %-100 %] 100 % (08/16 1700) Arterial Line BP: (111-204)/(56-92) 148/77 (08/16 1700) FiO2 (%):  [50 %] 50 % (08/16 1605)  General - obese, well developed, unresponsive.  Ophthalmologic - Fundi not visualized due to roving eyes.  Cardiovascular - Regular rate and rhythm.  Neuro - pt was examined in Cath lab. Not responsive on voice, eyes closed, on forced opening, eyes roving side to side, pupil dilated but reactive. Not blinking to visual threat and not tracking. No significant facial asymmetry. Tongue midline inside mouth. On pain stimulation bilateral upper extremity flexion with increased tone, bilateral lower extremity withdraw at least 3-/5. DTR 1+  Babinski negative. Sensation, coordination and gait not tested.   ASSESSMENT/PLAN Ms. ANGELIS GATES is a 66 y.o. female with history of HTN, HLD, pseudomotor cerebri, DM  presenting with headache and weakness of right hand. She did not receive IV t-PA due to out of window. CTA head and neck showed left ICA 65% stenosis. Dr. Bridgett Larsson consulted. During cerebral angiogram this morning, patient had acute mental status decline, became unresponsive. Code stroke called.  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     Resultant  Right hand subtle weakness  MRI head Multiple small areas of acute infarct in the left parietal cortex and left occipital cortex. Numerous chronic white matter infarcts bilaterally  CTA head and neck - Large amount of noncalcified plaque at the proximal left internal carotid artery, causing 60-65% stenosis.   2D Echo  EF 60-65%  CUS - left ICA 40-59% stenosis  Cerebral angio < 50% left ICA stenosis  LDL 211  HgbA1c 9.5  SCDs for VTE prophylaxis Diet NPO time specified  aspirin 81 mg daily prior to admission, now on aspirin 325 mg daily and clopidogrel 75 mg daily. Recommend DAPT for 3 months and then plavix alone according to POINT study.   Patient counseled to be compliant with her antithrombotic medications  Ongoing aggressive stroke risk factor management  Therapy recommendations: None  Disposition:  Pending  Left ICA stenosis  CTA neck - left ICA 60-65%  CUS - left ICA 40-59%  DSA: left ICA < 50% stenosis  No intervention needed at this time  Continue follow-up as outpatient  Hypertension  Unstable On clevidipine gtt, goal SBP < 140 Long-term BP goal normotensive  Hyperlipidemia  Home meds:  none  LDL 211, goal < 70  Patient not tolerating statins. self-reports leg numbness, chest pain, severe headaches with statins on multiple occassions with multiple providers  Recommend follow up with PCP Dr. Felipa Eth for consideration of PCSK9 inhibitor. Mediation name written to pt.  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 Jaynee [facility] at [facility] day # 3  This patient is critically ill due to code stroke status, acute mental status change, bilateral MCA thrombosis status post thrombectomy and at significant risk of neurological worsening, death form recurrent stroke, hemorrhagic transformation, seizure, brain edema, cerebral herniation. This patient's care requires constant monitoring of vital signs, hemodynamics, respiratory and cardiac monitoring, review of multiple databases, neurological assessment, discussion with family, other specialists and medical decision making of high complexity. I spent 50 minutes of neurocritical care time in the care of this patient.   Rosalin Hawking, MD PhD Stroke Neurology 06/03/2017 5:12 PM    To contact Stroke Continuity provider, please refer to [email or web address] After hours, contact General Neurology

## 2017-06-03 NOTE — Procedures (Signed)
S/P bilateral common carotid arteriogram,and Rt vert angiogram,followed by complete revascularization of occluded  MCAs M1 segments bilaterally, with x 1 pass with embotrap 12mmx 35 mm  Retrieval device achieving a TICI reperfusion bilaterally.

## 2017-06-03 NOTE — Transfer of Care (Addendum)
Immediate Anesthesia Transfer of Care Note  Patient: [name]  Procedure(s) Performed: Procedure(s): RADIOLOGY WITH ANESTHESIA (N/A)  Patient Location: ICU  Anesthesia Type:General  Level of Consciousness: sedated and Patient remains intubated per anesthesia plan  Airway & Oxygen Therapy: Patient remains intubated per anesthesia plan and Patient placed on Ventilator (see vital sign flow sheet for setting)  Post-op Assessment: Report given to RN and Post -op Vital signs reviewed and stable  Post vital signs: Reviewed and stable  Last Vitals:  Vitals:   06/03/17 1013 06/03/17 1355  BP:    Pulse:  63  Resp: 12 17  Temp:    SpO2: 95% 100%    Last Pain:  Vitals:   06/03/17 0947  TempSrc:   PainSc: 8       Patients Stated Pain Goal: 1 (27/61/47 0929)  Complications: No apparent anesthesia complications

## 2017-06-03 NOTE — Progress Notes (Signed)
Patient refused to have morning lab drawn

## 2017-06-03 NOTE — Consult Note (Signed)
PULMONARY / CRITICAL CARE MEDICINE   Name: [name] MRN: [id number] DOB: Feb 23, 1951    ADMISSION DATE:  05/31/2017 CONSULTATION DATE:  8/16  REFERRING MD:  Erlinda Hong   CHIEF COMPLAINT:  Vent management   HISTORY OF PRESENT ILLNESS:   66yo female with hx HTN, DM who presented initially 8/13 to her PCP with headache, R hight weakness and vision changes.  She was sent for MRI which revealed multiple areas of acute L MCA infarct.  She was sent to ER and admitted for CVA workup. On 8/16 pt was in cath lab for diagnostic carotid and cerebral angiogram with vascular surgery when she became suddenly unresponsive with L sided weakness and possible L sided seizure-like activity.  CTA revealed bilateral M1 occlusions.  She was intubated and taken urgently to IR for revascularization.  PCCM consulted for ICU/vent management post procedure.   PAST MEDICAL HISTORY :  She  has a past medical history of Anemia; Anesthesia complication; Diabetes mellitus; Family history of anesthesia complication (32 yrs ago); GERD (gastroesophageal reflux disease); Headache(784.0); Hypercholesteremia; Hypertension; Peripheral neuropathy; PONV (postoperative nausea and vomiting); Pseudotumor cerebri; Seizures ([place]); Synovitis of ankle (04/2013); and Wears dentures.  PAST SURGICAL HISTORY: She  has a past surgical history that includes Appendectomy; Bladder suspension; Lumbar peritoneal shunt; Total abdominal hysterectomy w/ bilateral salpingoophorectomy (1994); Ovarian cyst removal; Shunt revision (2007); Breast lumpectomy w/ needle localization (Left, 05/22/2011); Esophageal dilation (06/23/2006; 08/05/2004); Ankle arthroscopy (Left, 05/18/2013); Esophagogastroduodenoscopy (N/A, 07/04/2013); Balloon dilation (N/A, 07/04/2013); Wrist surgery (Left, 2014); Colonoscopy with propofol (N/A, 05/14/2014); Esophagogastroduodenoscopy (egd) with propofol (N/A, 05/14/2014); and Carotid Angiography (Bilateral, 06/03/2017).  Allergies  Allergen  Reactions  . Carbamazepine Hives, Shortness Of Breath and Other (See Comments)  . Penicillins Anaphylaxis and Other (See Comments)    Mother, father and brother have history of anaphylaxis reaction to penicillin so pt does not take   . Statins Other (See Comments)    Severe muscle weakness, leg numbness, severe headaches, chest pain   . Tricyclic Antidepressants Other (See Comments)    IMPAIRED MEMORY  . Atorvastatin Other (See Comments)    Severe muscle weakness, leg numbness, severe headaches, chest pain   . Ezetimibe Other (See Comments)    Severe muscle weakness, leg numbness, severe headaches, chest pain   . Nortriptyline Other (See Comments)    IMPAIRED MEMORY  . Quinapril Hcl Other (See Comments)    Unknown reaction  . Rifampin Diarrhea  . Metoclopramide Nausea And Vomiting and Rash    No current facility-administered medications on file prior to encounter.    Current Outpatient Prescriptions on File Prior to Encounter  Medication Sig  . cloNIDine (CATAPRES) 0.3 MG tablet Take 0.3 mg by mouth 2 (two) times daily.   . diclofenac sodium (VOLTAREN) 1 % GEL Apply 2 application topically 4 (four) times daily as needed (pain).   Marland Kitchen esomeprazole (NEXIUM) 40 MG capsule Take 40 mg by mouth daily as needed (acid reflux). Acid reflux  . estropipate (OGEN) 1.5 MG tablet Take 0.75 mg by mouth daily.   . furosemide (LASIX) 20 MG tablet Take 20 mg by mouth daily.  Marland Kitchen glipiZIDE (GLUCOTROL) 10 MG tablet Take 10 mg by mouth 2 (two) times daily before a meal.   . metoprolol (LOPRESSOR) 50 MG tablet Take 50 mg by mouth at bedtime.   . metoprolol (TOPROL-XL) 100 MG 24 hr tablet Take 100 mg by mouth daily with breakfast.   . PRESCRIPTION MEDICATION Take 20 mg by mouth See admin instructions. Domperidone  10 mg from [place]: Take 2 tablets (20 mg) by mouth three times daily with meals - for diabetic gastroparesis  . telmisartan (MICARDIS) 80 MG tablet Take 80 mg by mouth at bedtime.   Marland Kitchen  acetaZOLAMIDE (DIAMOX) 250 MG tablet Take 1 tablet (250 mg total) by mouth 3 (three) times daily. (Patient not taking: Reported on 05/31/2017)    FAMILY HISTORY:  Her indicated that her mother is deceased. She indicated that her father is deceased. She indicated that her sister is alive. She indicated that her brother is alive. She indicated that the status of her neg hx is unknown.    SOCIAL HISTORY: She  reports that she has never smoked. She has never used smokeless tobacco. She reports that she does not drink alcohol or use drugs.  REVIEW OF SYSTEMS:   Unable. As per HPI above obtained from records, RN, CRNA.   SUBJECTIVE:    VITAL SIGNS: BP (!) 186/124   Pulse 63   Temp 97.8 F (36.6 C) (Oral)   Resp 17   Ht 5\' 4"  (1.626 m)   Wt 73.3 kg (161 lb 8 oz)   SpO2 100%   BMI 26.88 kg/m   HEMODYNAMICS:    VENTILATOR SETTINGS: Vent Mode: PRVC FiO2 (%):  [50 %] 50 % Set Rate:  [16 bmp] 16 bmp Vt Set:  [440 mL-460 mL] 440 mL PEEP:  [5 cmH20] 5 cmH20 Plateau Pressure:  [17 cmH20] 17 cmH20  INTAKE / OUTPUT: I/O last 3 completed shifts: In: 60 [P.O.:720] Out: -   PHYSICAL EXAMINATION: General:  Chronically ill appearing female, NAD  Neuro:  Sedated on vent, RASS -2 on propofol HEENT:  Mm moist, ETT Cardiovascular:  s1s2 rrr Lungs:  resps even non labored on vent, clear  Abdomen:  Round, soft, +bs  Musculoskeletal:  Warm and dry, no sig edema  LABS:  BMET  Recent Labs Lab 05/31/17 1627 05/31/17 1647 06/03/17 0537  NA 133* 134* 137  K 4.5 4.5 4.4  CL 101 102 103  CO2 23  --  23  BUN 21* 25* 23*  CREATININE 1.18* 1.20* 1.00  GLUCOSE 297* 306* 165*    Electrolytes  Recent Labs Lab 05/31/17 1627 06/03/17 0537  CALCIUM 9.4 9.6    CBC  Recent Labs Lab 05/31/17 1627 05/31/17 1647 06/03/17 1352  WBC 11.1*  --  12.7*  HGB 11.9* 12.9 9.8*  HCT 36.6 38.0 30.5*  PLT 346  --  322    Coag's  Recent Labs Lab 05/31/17 1627  APTT 25  INR 0.99     Sepsis Markers No results for input(s): LATICACIDVEN, PROCALCITON, O2SATVEN in the last 168 hours.  ABG No results for input(s): PHART, PCO2ART, PO2ART in the last 168 hours.  Liver Enzymes  Recent Labs Lab 05/31/17 1627  AST 43*  ALT 26  ALKPHOS 159*  BILITOT 0.4  ALBUMIN 3.8    Cardiac Enzymes No results for input(s): TROPONINI, PROBNP in the last 168 hours.  Glucose  Recent Labs Lab 06/02/17 0619 06/02/17 1103 06/02/17 1637 06/02/17 2216 06/03/17 0614 06/03/17 1004  GLUCAP 228* 173* 175* 174* 178* 101*    Imaging Ct Angio Head W Or Wo Contrast  Result Date: 06/03/2017 CLINICAL DATA:  Upper extremity deficits and unresponsiveness. Carotid catheterization. EXAM: CT ANGIOGRAPHY HEAD AND NECK TECHNIQUE: Multidetector CT imaging of the head and neck was performed using the standard protocol during bolus administration of intravenous contrast. Multiplanar CT image reconstructions and MIPs were obtained to evaluate the  vascular anatomy. Carotid stenosis measurements (when applicable) are obtained utilizing NASCET criteria, using the distal internal carotid diameter as the denominator. CONTRAST:  50 cc Isovue 370 intravenous COMPARISON:  CTA 3 days ago FINDINGS: CTA NECK FINDINGS Aortic arch: Mild atheromatous wall thickening and calcification. No ulceration. Three vessel branching. Right carotid system: Moderate calcified and noncalcified plaque on the posterior wall of the ICA bulb. No flow limiting stenosis. No ulceration or dissection. Left carotid system: Prominent proximally calcified and distally noncalcified atheromatous plaque on the ICA bulb with approximately 50-60% stenosis. The noncalcified plaque portion shows positive remodeling. Negative for dissection or interval ulceration. Vertebral arteries: No proximal subclavian stenosis. Mild left vertebral artery dominance. The vertebral arteries are widely patent to the dura. Skeleton: No acute or aggressive finding  Other neck: No incidental mass or inflammation. Upper chest: No acute finding. Review of the MIP images confirms the above findings CTA HEAD FINDINGS Anterior circulation: Bilateral calcified plaque on the carotid siphons as seen previously. There is a mild supraclinoid ICA narrowing on the left. Luminal filling defect at the right M1 2 junction, over riding the anterior temporal branch which is nonenhancing. There is prompt downstream flow suggesting subtotal occlusion. There is in the elongated luminal clot within the left M1 extending into the main M2 branch with diminished flow but subtotal occlusion. Posterior circulation: Left dominant vertebral artery. Moderate proximal right V4 and mid basilar stenoses. Mild moderate atherosclerotic irregularity of bilateral PCA branches without branch occlusion. Fetal type right PCA. Venous sinuses: Patent as permitted by contrast timing Anatomic variants: Fetal type right PCA Delayed phase: Not obtained in the emergent setting. Images were reviewed in person with Dr. Erlinda Hong as the data set was being transported to PACs. Review of the MIP images confirms the above findings IMPRESSION: 1. Right M1 embolus with subtotal occlusion but severe stenosis. The anterior temporal branch is covered and non-opacified. 2. Elongated embolus within the left M1 and proximal M2 vessels. 3. Negative for dissection or interval ulceration in the arch and cervical carotids. 4. Carotid bifurcation atherosclerosis with approximately 50-60% left ICA bulb narrowing. Visually the stenosis appears worse due to positive remodeling at the low-density plaque. 5. Moderate atheromatous narrowing of the right V4 and mid basilar. Electronically Signed   By: Monte Fantasia M.D.   On: 06/03/2017 11:06   Ct Head Wo Contrast  Result Date: 06/03/2017 CLINICAL DATA:  66 y/o  F; status posts IR intervention for stroke. EXAM: CT HEAD WITHOUT CONTRAST TECHNIQUE: Contiguous axial images were obtained from the base  of the skull through the vertex without intravenous contrast. COMPARISON:  CT head and CTA head dated 06/03/2017. MRI head dated 05/30/2017. FINDINGS: Brain: Persisting contrast from recent intervention. There are several foci of enhancement present within bilateral parietal and left frontal cortex as well as within the left basal ganglia likely representing enhancing infarcts. No large hemorrhage identified, a small hemorrhage may be obscured by the presence of contrast. No focal mass effect, herniation, or extra-axial collection. Background of Vascular: Central circle of Jannifer Franklin is enhancing as are the large dural venous sinuses. Skull: Normal. Negative for fracture or focal lesion. Sinuses/Orbits: No acute finding. Other: None. IMPRESSION: 1. Several small foci of enhancement within bilateral parietal and left frontal cortex as well as left basal ganglia probably representing enhancing infarctions. 2. No large hemorrhage, focal mass effect, herniation, or hydrocephalus. Electronically Signed   By: Kristine Garbe M.D.   On: 06/03/2017 13:56   Ct Angio Neck W Or Wo  Contrast  Result Date: 06/03/2017 CLINICAL DATA:  Upper extremity deficits and unresponsiveness. Carotid catheterization. EXAM: CT ANGIOGRAPHY HEAD AND NECK TECHNIQUE: Multidetector CT imaging of the head and neck was performed using the standard protocol during bolus administration of intravenous contrast. Multiplanar CT image reconstructions and MIPs were obtained to evaluate the vascular anatomy. Carotid stenosis measurements (when applicable) are obtained utilizing NASCET criteria, using the distal internal carotid diameter as the denominator. CONTRAST:  50 cc Isovue 370 intravenous COMPARISON:  CTA 3 days ago FINDINGS: CTA NECK FINDINGS Aortic arch: Mild atheromatous wall thickening and calcification. No ulceration. Three vessel branching. Right carotid system: Moderate calcified and noncalcified plaque on the posterior wall of the ICA  bulb. No flow limiting stenosis. No ulceration or dissection. Left carotid system: Prominent proximally calcified and distally noncalcified atheromatous plaque on the ICA bulb with approximately 50-60% stenosis. The noncalcified plaque portion shows positive remodeling. Negative for dissection or interval ulceration. Vertebral arteries: No proximal subclavian stenosis. Mild left vertebral artery dominance. The vertebral arteries are widely patent to the dura. Skeleton: No acute or aggressive finding Other neck: No incidental mass or inflammation. Upper chest: No acute finding. Review of the MIP images confirms the above findings CTA HEAD FINDINGS Anterior circulation: Bilateral calcified plaque on the carotid siphons as seen previously. There is a mild supraclinoid ICA narrowing on the left. Luminal filling defect at the right M1 2 junction, over riding the anterior temporal branch which is nonenhancing. There is prompt downstream flow suggesting subtotal occlusion. There is in the elongated luminal clot within the left M1 extending into the main M2 branch with diminished flow but subtotal occlusion. Posterior circulation: Left dominant vertebral artery. Moderate proximal right V4 and mid basilar stenoses. Mild moderate atherosclerotic irregularity of bilateral PCA branches without branch occlusion. Fetal type right PCA. Venous sinuses: Patent as permitted by contrast timing Anatomic variants: Fetal type right PCA Delayed phase: Not obtained in the emergent setting. Images were reviewed in person with Dr. Erlinda Hong as the data set was being transported to PACs. Review of the MIP images confirms the above findings IMPRESSION: 1. Right M1 embolus with subtotal occlusion but severe stenosis. The anterior temporal branch is covered and non-opacified. 2. Elongated embolus within the left M1 and proximal M2 vessels. 3. Negative for dissection or interval ulceration in the arch and cervical carotids. 4. Carotid bifurcation  atherosclerosis with approximately 50-60% left ICA bulb narrowing. Visually the stenosis appears worse due to positive remodeling at the low-density plaque. 5. Moderate atheromatous narrowing of the right V4 and mid basilar. Electronically Signed   By: Monte Fantasia M.D.   On: 06/03/2017 11:06   Ct Head Code Stroke Wo Contrast  Result Date: 06/03/2017 CLINICAL DATA:  Code stroke.  Unresponsive. EXAM: CT HEAD WITHOUT CONTRAST TECHNIQUE: Contiguous axial images were obtained from the base of the skull through the vertex without intravenous contrast. COMPARISON:  05/31/2017 FINDINGS: Brain: Known recent small cortical infarcts in the posterior left cerebral hemisphere are not clearly visualized. Small remote right frontal parietal cortex infarcts, stable. Patchy low-density in the cerebral white matter from chronic small vessel ischemia, moderate. No acute hemorrhage. No evidence of interval infarct. No hydrocephalus or masslike findings. Vascular: Intravenous contrast. Major vessels appear symmetrically dense. Patient was in the cath lab this morning. Operative details are currently not known. Skull: Negative Sinuses/Orbits: Negative Other: Prelim via text page sent 06/03/2017 at 10:39 am to Dr. Rosalin Hawking. ASPECTS [facility] Stroke Program Early CT Score) Not scored in this setting.  IMPRESSION: 1. No acute finding or change from 3 days ago. 2. 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 Chronic small vessel ischemic changes elsewhere are stable. Electronically Signed   By: Monte Fantasia M.D.   On: 06/03/2017 10:41     STUDIES:  MR brain 8/12>>> Multiple small areas of acute infarct in the left parietal cortex and left occipital cortex. Recommend CTA head and neck to evaluate for source of emboli or stenosis causing these infarcts. Numerous chronic white matter infarcts bilaterally CTA head 8/16>>> 1. Right M1 embolus with subtotal occlusion but severe stenosis.  The anterior temporal branch is covered and non-opacified. 2. Elongated embolus within the left M1 and proximal M2 vessels. 3. Negative for dissection or interval ulceration in the arch and cervical carotids. 4. Carotid bifurcation atherosclerosis with approximately 50-60% left ICA bulb narrowing. Visually the stenosis appears worse due to positive remodeling at the low-density plaque. 5. Moderate atheromatous narrowing of the right V4 and mid basilar. CT head 8/16>>> 1. Several small foci of enhancement within bilateral parietal and left frontal cortex as well as left basal ganglia probably representing enhancing infarctions. 2. No large hemorrhage, focal mass effect, herniation, or Hydrocephalus. 2D echo 8/14>>>  - Left ventricle: The cavity size was normal. Systolic function was normal. The estimated ejection fraction was in the range of 60% to 65%. Wall motion was normal; there were no regional wall motion abnormalities. Doppler parameters are consistent with abnormal left ventricular relaxation (grade 1 diastolic dysfunction).   CULTURES:   ANTIBIOTICS:   SIGNIFICANT EVENTS: 8/16 - sudden change in mental status, to IR for revascularization of bilat MCA occlusions   LINES/TUBES: ETT 8/16>>> R rad aline 8/16>>>  DISCUSSION: 66yo female admitted with acute L MCA stroke, then acute R MCA occlusion during diagnostic arteriogram with AMS requiring intubation.  Taken to IR for revascularization.   ASSESSMENT / PLAN:  PULMONARY Acute respiratory failure - r/t AMS  P:   Vent support - 8cc/kg  F/u CXR  F/u ABG SBT in am if mental status supports    CARDIOVASCULAR Hx HTN  P:  Goal SBP 120-140   RENAL No active issue  P:   F/u chem in am   GASTROINTESTINAL No active issue  P:   TF  PPI   HEMATOLOGIC Bilateral MCA occlusion  L ICA stenosis  P:  lovenox  plavix   INFECTIOUS No active issue  P:   Monitor wbc, fever curve off abx   ENDOCRINE DM - Hgb  A1c >9 P:   Lantus, SSI  Will start TF now   NEUROLOGIC Bilateral acute MCA occlusion- s/p IR revascularization  Left ICA stenosis P:   RASS goal: -1 Per stroke team  WUA in am  Plavix, asa    FAMILY  - Updates: no family at bedside 8/16  - Inter-disciplinary family meet or Palliative Care meeting due by:  Day 7    Nickolas Madrid, NP 06/03/2017  2:23 PM Pager: [phone] or [phone]

## 2017-06-03 NOTE — Progress Notes (Signed)
Pt transported to Cath Lab at this time.  Central Tele notified.

## 2017-06-03 NOTE — Anesthesia Procedure Notes (Signed)
Date/Time: 06/03/2017 11:35 AM Performed by: Lelon Perla A

## 2017-06-03 NOTE — Anesthesia Preprocedure Evaluation (Signed)
Anesthesia Evaluation  Patient identified by MRN, date of birth, ID band Patient unresponsive    Reviewed: Allergy & Precautions, H&P , Patient's Chart, lab work & pertinent test results, Unable to perform ROS - Chart review onlyPreop documentation limited or incomplete due to emergent nature of procedure.  History of Anesthesia Complications (+) PONV and history of anesthetic complications  Airway Mallampati: II  TM Distance: >3 FB Neck ROM: Limited  Mouth opening: Limited Mouth Opening Comment: Difficult to exam airway in IR Dental  (+) Edentulous Upper, Edentulous Lower   Pulmonary neg pulmonary ROS,    Pulmonary exam normal        Cardiovascular hypertension, Pt. on home beta blockers and On Medications Normal cardiovascular exam  ECG: NSR, rate 76  ECHO: Left ventricle: The cavity size was normal. Systolic function was normal. The estimated ejection fraction was in the range of 60% to 65%. Wall motion was normal; there were no regional wall motion abnormalities. Doppler parameters are consistent with abnormal left ventricular relaxation (grade 1 diastolic dysfunction).   Neuro/Psych  Headaches, Seizures -,   Neuromuscular disease CVA    GI/Hepatic Neg liver ROS, GERD  Medicated,  Endo/Other  diabetes, Type 2, Insulin Dependent, Oral Hypoglycemic Agents  Renal/GU negative Renal ROS     Musculoskeletal   Abdominal Normal abdominal exam  (+)   Peds  Hematology   Anesthesia Other Findings Hypercholesteremia  Reproductive/Obstetrics                             Anesthesia Physical  Anesthesia Plan  ASA: IV and emergent  Anesthesia Plan: General   Post-op Pain Management:    Induction: Intravenous and Rapid sequence  PONV Risk Score and Plan: 4 or greater and Ondansetron and Treatment may vary due to age or medical condition  Airway Management Planned: Oral ETT  Additional Equipment:    Intra-op Plan:   Post-operative Plan: Possible Post-op intubation/ventilation  Informed Consent: I have reviewed the patients History and Physical, chart, labs and discussed the procedure including the risks, benefits and alternatives for the proposed anesthesia with the patient or authorized representative who has indicated his/her understanding and acceptance.     Plan Discussed with: CRNA  Anesthesia Plan Comments:         Anesthesia Quick Evaluation

## 2017-06-03 NOTE — Anesthesia Procedure Notes (Signed)
Arterial Line Insertion Start/End8/16/2018 11:35 AM, 06/03/2017 11:30 AM Performed by: Murvin Natal, anesthesiologist  Patient location: OR. Preanesthetic checklist: patient identified, IV checked, surgical consent, monitors and equipment checked, pre-op evaluation, timeout performed and anesthesia consent Emergency situation Left, radial was placed Catheter size: 20 Fr Hand hygiene performed  and maximum sterile barriers used   Attempts: 1 Procedure performed using ultrasound guided technique. Ultrasound Notes:image(s) printed for medical record Following insertion, dressing applied. Post procedure assessment: normal and unchanged  Patient tolerated the procedure well with no immediate complications.

## 2017-06-03 NOTE — Progress Notes (Signed)
EEG completed; results pending.    

## 2017-06-03 NOTE — Op Note (Addendum)
OPERATIVE NOTE   PROCEDURE: 1.  Right common femoral artery cannulation under ultrasound guidance 2.  Placement of catheter in aorta 3.  Arch Aortogram 4.  Right common carotid artery selection 5.  Right carotid and cerebral angiogram 6.  Left carotid and cerebral angiogram  PRE-OPERATIVE DIAGNOSIS: left carotid artery disease, left stroke  POST-OPERATIVE DIAGNOSIS: same as above   SURGEON: Adele Barthel, MD  ANESTHESIA: local anesthesia  ESTIMATED BLOOD LOSS: 50 cc  CONTRAST: 91 cc  FINDING(S):  Type I aortic arch: widely patent  Innominate artery: widely patent  R common carotid artery: widely patent  R internal carotid artery: minimal disease, diffuse atherosclerotic plaque suggested  R external carotid artery: minimal disease, diffuse atherosclerotic plaque suggested  R subclavian artery: patent  R vertebral artery: patent proximally  Left common carotid artery: patent  L internal carotid artery: patent, 43% stenosis by NASCET criteria proximally, disease extends minimally to angle of mandible, may be higher (on anterior-posterior projection)  L external carotid artery: patent  Left subclavian artery: patent  Left vertebral artery: patent  Intracranial findings to be read by Neuroradiology  SPECIMEN(S):  none  INDICATIONS:   [name] is a 66 y.o. female who presents with left side stroke and CTA neck suggestive for L ICA stenosis >70% and possible source for recent CVA.  The patient presents for: B carotid and cerebral angiography.  I discussed with the patient the nature of angiographic procedures, especially the limited patencies of any endovascular intervention.  The patient is aware of that the risks of an angiographic procedure include but are not limited to: bleeding, infection, access site complications, renal failure, embolization, rupture of vessel, dissection, possible need for emergent surgical intervention, possible need for surgical  procedures to treat the patient's pathology, and stroke and death.  The patient is aware of the risks and agrees to proceed.  DESCRIPTION: After full informed consent was obtained from the patient, the patient was brought back to the angiography suite.  The patient was placed supine upon the angiography table and connected to cardiopulmonary monitoring equipment.  A circulating radiologic technician maintained continuous monitoring of the patient's cardiopulmonary status.  Additionally, the control room radiologic technician provided backup monitoring throughout the procedure.  The patient was prepped and drape in the standard fashion for an angiographic procedure.  At this point, attention was turned to the right groin.  Under ultrasound guidance, the subcutaneous tissue surrounding the right common femoral artery was anesthesized with 1% lidocaine with epinephrine.  The artery was then cannulated with a micropuncture needle.  The microwire was advanced into the iliac arterial system.  The needle was exchanged for a microsheath, which was loaded into the common femoral artery over the wire.  The microwire was exchanged for a Bentson wire which was advanced into the aorta.  The microsheath was then exchanged for a 5-Fr sheath which was loaded into the common femoral artery.  The long pigtail catheter was then loaded over the wire up to the level of ascending aorta.  The catheter was connected to the power injector circuit.  After de-airring and de-clotting the circuit, a power injector arch aortogram was completed at 40 degree LAO.  The findings are listed above.  The catheter was then pulled into the descending thoracic aorta.  The catheter was exchanged for a BER-2 catheter over the Bentson wire.  Using this combination, I selected the innominate artery.  The right common carotid artery was then selected.  The catheter was  advanced into the proximal right common carotid artery.  The wire was removed and  then the catheter de-airred and declotted.  The catheter was connected to the power injector circuit.  A right cerebral and carotid angiogram was completed in anteroposterior and lateral projections.  The findings are listed above.    The Bentson wire was replaced in the catheter and the catheter and wire pulled back into the aortic arch.  The left common carotid artery was selected with a combination of a BER-2 catheter and Bentson wire.   The catheter was advanced into the proximal left common carotid artery.  The wire was removed and then the catheter de-airred and declotted.  The catheter was connected to the power injector circuit.    At this point, I noticed that the patient was more drowsy than previously.  I had concerns that this patient was having a stroke vs. seizure.  As I already had the catheter in the left common carotid artery, I felt could complete the left carotid and cerebral angiogram quickly without interfering with the Stroke team's subsequent work-up.    A left cerebral and carotid angiogram was completed in anteroposterior and lateral projections.  The findings are listed above.  I replaced the wire in the catheter and removed the wire with catheter.  The sheath was aspirated.  No clots were present and the sheath was reloaded with heparinized saline.  At this point, the patient started demonstrating some tonic movement in left side suggestive of seizure activity.  The stroke team was present and we decided to leaving the right femoral sheath while neuroradiology was reviewing the image in consideration of any intracranial intervention.     COMPLICATIONS: none  CONDITION: stable   Adele Barthel, MD, [facility] [place] Office: [phone] Pager: [phone]  06/03/2017, 10:29 AM  Addendum  CTA Neck c/w with R sided embolism.  IR will take pt to [facility] suite for attempt at mechanical thrombectomy.   Adele Barthel, MD, FACS [facility] [employer] Office: [phone] Pager: [phone]  06/03/2017, 1:20 PM

## 2017-06-03 NOTE — H&P (View-Only) (Signed)
New Carotid Patient  Requested by:  Dr. Vassie Moment (Neurology)  Reason for consultation: left carotid stenosis   History of Present Illness   [name] is a 66 y.o. (25-Nov-1950) female who presents with chief complaint: right hand weakness and discoordination.  Roughly one week ago, patient started having right hand grip weakness and discoordination while at her Neurology appointment.  MRI Head found to be consistent with L CVA.  Pt was admitted to expedite her work-up.  At this point, the patient feels her right hand strength is 90% of normal and discoordination is improved.    Recent carotid studies demonstrated: RICA 5-78% stenosis, LICA 46-96% stenosis.  Patient has no prior history of TIA or stroke symptom.  The patient has never had amaurosis fugax or monocular blindness.  The patient has never had facial drooping or hemiplegia.  The patient has never had receptive or expressive aphasia.     The patient's risks factors for carotid disease include: IDDM, HLD, HTN.  Past Medical History:  Diagnosis Date  . Anemia    takes iron supplement  . Anesthesia complication    disorientation due to pseudotumor  . Diabetes mellitus    IDDM  . Family history of anesthesia complication 11 yrs ago   brother stopped breathing for a minute or two  . GERD (gastroesophageal reflux disease)   . Headache(784.0)    due to pseudotumor; daily headache  . Hypercholesteremia    unable to tolerate statins  . Hypertension    under control with meds., has been on med. x 5 yr.  . Peripheral neuropathy   . PONV (postoperative nausea and vomiting)   . 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 Synovitis of ankle 04/2013   left  . Wears dentures    full    Past Surgical History:  Procedure Laterality Date  . ANKLE ARTHROSCOPY Left 05/18/2013   Procedure: LEFT ANKLE ARTHROSCOPY WITH DEBRIDEMENT,  SUBTALAR OPEN  DEBRIDEMENT ;  Surgeon: Wylene Simmer, MD;  Location: [facility];  Service: Orthopedics;  Laterality: Left;  . APPENDECTOMY     as a child  . BALLOON DILATION N/A 07/04/2013   Procedure: BALLOON DILATION;  Surgeon: Garlan Fair, MD;  Location: [facility];  Service: Endoscopy;  Laterality: N/A;  . BLADDER SUSPENSION    . BREAST LUMPECTOMY W/ NEEDLE LOCALIZATION Left 05/22/2011  . COLONOSCOPY WITH PROPOFOL N/A 05/14/2014   Procedure: COLONOSCOPY WITH PROPOFOL;  Surgeon: Garlan Fair, MD;  Location: [facility];  Service: Endoscopy;  Laterality: N/A;  . ESOPHAGEAL DILATION  06/23/2006; 08/05/2004  . ESOPHAGOGASTRODUODENOSCOPY N/A 07/04/2013   Procedure: ESOPHAGOGASTRODUODENOSCOPY (EGD);  Surgeon: Garlan Fair, MD;  Location: [facility];  Service: Endoscopy;  Laterality: N/A;  . ESOPHAGOGASTRODUODENOSCOPY (EGD) WITH PROPOFOL N/A 05/14/2014   Procedure: ESOPHAGOGASTRODUODENOSCOPY (EGD) WITH PROPOFOL;  Surgeon: Garlan Fair, MD;  Location: [facility];  Service: Endoscopy;  Laterality: N/A;  . LUMBAR PERITONEAL SHUNT     x 2  . OVARIAN CYST REMOVAL     age 58  . SHUNT REVISION  2007  . TOTAL ABDOMINAL HYSTERECTOMY W/ BILATERAL SALPINGOOPHORECTOMY  1994   age 88s  . WRIST SURGERY Left 2014    [name]    Social History   Social History  . Marital status: Married    Spouse name: Simona Huh  . Number of children: 2  .  Years of education: 39   Occupational History  . Not on file.   Social History Main Topics  . Smoking status: Never Smoker  . Smokeless tobacco: Never Used  . Alcohol use No  . Drug use: No  . Sexual activity: Not on file   Other Topics Concern  . Not on file   Social History Narrative   Lives at home with husband and daughter   Caffeine use: coke daily       Family History  Problem Relation Age of Onset  . Heart disease Father   . Migraines Neg Hx     Current Facility-Administered Medications  Medication Dose Route Frequency  Provider Last Rate Last Dose  . 0.9 %  sodium chloride infusion   Intravenous Continuous Jani Gravel, MD      . acetaminophen (TYLENOL) tablet 650 mg  650 mg Oral Q4H PRN Jani Gravel, MD       Or  . acetaminophen (TYLENOL) solution 650 mg  650 mg Per Tube Q4H PRN Jani Gravel, MD       Or  . acetaminophen (TYLENOL) suppository 650 mg  650 mg Rectal Q4H PRN Jani Gravel, MD      . aspirin chewable tablet 324 mg  324 mg Oral Once Jani Gravel, MD      . aspirin EC tablet 325 mg  325 mg Oral Daily Jani Gravel, MD      . diclofenac sodium (VOLTAREN) 1 % transdermal gel 2 g  2 g Topical QID PRN Jani Gravel, MD   2 g at 06/01/17 1057  . estropipate (OGEN) tablet 0.75 mg  0.75 mg Oral Daily Jani Gravel, MD   0.75 mg at 06/01/17 1057  . insulin aspart (novoLOG) injection 0-9 Units  0-9 Units Subcutaneous TID WC Jani Gravel, MD   3 Units at 06/01/17 1200  . insulin glargine (LANTUS) injection 18 Units  18 Units Subcutaneous QHS Jani Gravel, MD   18 Units at 05/31/17 2313  . metoprolol succinate (TOPROL-XL) 24 hr tablet 100 mg  100 mg Oral Daily Jani Gravel, MD   100 mg at 06/01/17 1057  . metoprolol tartrate (LOPRESSOR) tablet 50 mg  50 mg Oral QHS Jani Gravel, MD   50 mg at 05/31/17 2314  . pantoprazole (PROTONIX) EC tablet 40 mg  40 mg Oral Daily PRN Jani Gravel, MD        Allergies  Allergen Reactions  . Carbamazepine Hives, Shortness Of Breath and Other (See Comments)  . Penicillins Anaphylaxis and Other (See Comments)    Mother, father and brother have history of anaphylaxis reaction to penicillin so pt does not take   . Statins Other (See Comments)    Severe muscle weakness, leg numbness, severe headaches, chest pain   . Tricyclic Antidepressants Other (See Comments)    IMPAIRED MEMORY  . Atorvastatin Other (See Comments)    Severe muscle weakness, leg numbness, severe headaches, chest pain   . Ezetimibe Other (See Comments)    Severe muscle weakness, leg numbness, severe headaches, chest pain   .  Nortriptyline Other (See Comments)    IMPAIRED MEMORY  . Quinapril Hcl Other (See Comments)    Unknown reaction  . Rifampin Diarrhea  . Metoclopramide Nausea And Vomiting and Rash    REVIEW OF SYSTEMS (negative unless checked):   Cardiac:  []  Chest pain or chest pressure? []  Shortness of breath upon activity? []  Shortness of breath when lying flat? []  Irregular heart rhythm?  Vascular:  []   Pain in calf, thigh, or hip brought on by walking? []  Pain in feet at night that wakes you up from your sleep? []  Blood clot in your veins? []  Leg swelling?  Pulmonary:  []  Oxygen at home? []  Productive cough? []  Wheezing?  Neurologic:  [x]  Sudden weakness in arms or legs? []  Sudden numbness in arms or legs? []  Sudden onset of difficult speaking or slurred speech? [x]  Recurrent visual loss? []  Problems with dizziness?  Gastrointestinal:  []  Blood in stool? []  Vomited blood?  Genitourinary:  []  Burning when urinating? []  Blood in urine?  Psychiatric:  []  Major depression  Hematologic:  []  Bleeding problems? []  Problems with blood clotting?  Dermatologic:  []  Rashes or ulcers?  Constitutional:  []  Fever or chills?  Ear/Nose/Throat:  []  Change in hearing? []  Nose bleeds? []  Sore throat?  Musculoskeletal:  []  Back pain? []  Joint pain? []  Muscle pain?   For VQI Use Only   PRE-ADM LIVING Home  AMB STATUS Ambulatory  CAD Sx None  PRIOR CHF None  STRESS TEST No    Physical Examination     Vitals:   06/01/17 0636 06/01/17 1040 06/01/17 1146 06/01/17 1452  BP: (!) 132/54 (!) 124/51 (!) 145/63 140/63  Pulse: 67 68 66 67  Resp: 16 18 18 18   Temp:  98 F (36.7 C) 97.6 F (36.4 C) 98.1 F (36.7 C)  TempSrc:  Oral Oral Oral  SpO2: 99% 98% 98% 96%  Weight:      Height:       Body mass index is 26.88 kg/m.  General Alert, O x 3, WD, NAD  Head [place]/AT,    Ear/Nose/ Throat Hearing grossly intact, nares without erythema or drainage, oropharynx without  Erythema or Exudate, Mallampati score: 3,   Eyes PERRLA, EOMI,    Neck Supple, mid-line trachea,    Pulmonary Sym exp, good B air movt, CTA B  Cardiac RRR, Nl S1, S2, Faint SEM, No rubs, +S3, no S4  Vascular Vessel Right Left  Radial Palpable Palpable  Brachial Palpable Palpable  Carotid Palpable, No Bruit Palpable, No Bruit  Aorta Not palpable N/A  Femoral Palpable Palpable  Popliteal Not palpable Not palpable  PT Faintly palpable Faintly palpable  DP Faintly palpable Faintly palpable    Gastro- intestinal soft, non-distended, non-tender to palpation, No guarding or rebound, no HSM, no masses, no CVAT B, No palpable prominent aortic pulse, surgical incision healed    Musculo- skeletal M/S 5/5 throughout  , Extremities without ischemic changes  , No edema present, No visible varicosities , No Lipodermatosclerosis present  Neurologic Cranial nerves 2-12 intact for documented visual field loss , Pain and light touch intact in extremities , Motor exam as listed above  Psychiatric Judgement intact, Mood & affect appropriate for pt's clinical situation  Dermatologic See M/S exam for extremity exam, No rashes otherwise noted  Lymphatic  Palpable lymph nodes: None    Radiology     Ct Angio Head W Or Wo Contrast  Result Date: 05/31/2017 CLINICAL DATA:  Headache and right arm weakness EXAM: CT ANGIOGRAPHY HEAD AND NECK TECHNIQUE: Multidetector CT imaging of the head and neck was performed using the standard protocol during bolus administration of intravenous contrast. Multiplanar CT image reconstructions and MIPs were obtained to evaluate the vascular anatomy. Carotid stenosis measurements (when applicable) are obtained utilizing NASCET criteria, using the distal internal carotid diameter as the denominator. CONTRAST:  50 mL Isovue 370 COMPARISON:  None. FINDINGS: CT HEAD FINDINGS Brain: No  mass lesion, intraparenchymal hemorrhage or extra-axial collection. No evidence of acute cortical infarct.  Brain parenchyma and CSF-containing spaces are normal for age. Vascular: No hyperdense vessel or unexpected calcification. Skull: Normal visualized skull base, calvarium and extracranial soft tissues. Sinuses/Orbits: No sinus fluid levels or advanced mucosal thickening. No mastoid effusion. Normal orbits. Review of the MIP images confirms the above findings CTA NECK FINDINGS Aortic arch: There is no aneurysm or dissection of the visualized ascending aorta or aortic arch. Normal 3 vessel aortic branching pattern. The visualized proximal subclavian arteries are normal. Mild calcific aortic atherosclerosis. Right carotid system: The right common carotid origin is widely patent. There is no common carotid or internal carotid artery dissection or aneurysm. There is mixed calcified and noncalcified plaque at the right carotid bifurcation without hemodynamically significant stenosis. Left carotid system: There is predominantly noncalcified plaque within the proximal left internal carotid artery. This results in stenosis that measures 60% by NASCET criteria. Subjectively, the stenosis appears severe. Vertebral arteries: The vertebral system is left dominant. Both vertebral artery origins are normal. Both vertebral arteries are normal to their confluence with the basilar artery. Skeleton: There is no bony spinal canal stenosis. No lytic or blastic lesions. Other neck: The nasopharynx is clear. The oropharynx and hypopharynx are normal. The epiglottis is normal. The supraglottic larynx, glottis and subglottic larynx are normal. No retropharyngeal collection. The parapharyngeal spaces are preserved. The parotid and submandibular glands are normal. No sialolithiasis or salivary ductal dilatation. The thyroid gland is normal. There is no cervical lymphadenopathy. Upper chest: No pneumothorax or pleural effusion. No nodules or masses. Review of the MIP images confirms the above findings CTA HEAD FINDINGS Anterior circulation:  --Intracranial internal carotid arteries: Normal. --Anterior cerebral arteries: Normal. --Middle cerebral arteries: Normal. --Posterior communicating arteries: Present bilaterally. Posterior circulation: --Posterior cerebral arteries: Normal. --Superior cerebellar arteries: Normal. --Basilar artery: Normal. --Anterior inferior cerebellar arteries: Normal. --Posterior inferior cerebellar arteries: Normal. Venous sinuses: As permitted by contrast timing, patent. Anatomic variants: None Delayed phase: No parenchymal contrast enhancement. Review of the MIP images confirms the above findings IMPRESSION: 1. No emergent large vessel occlusion. 2. Large amount of noncalcified plaque at the proximal left internal carotid artery, causing 60-65% stenosis. This is the most likely source of emboli for the previously identified left parietal and occipital infarcts. 3. No other intracranial or cervical arterial stenosis. 4. Minimal Aortic Atherosclerosis (ICD10-I70.0). Electronically Signed   By: Ulyses Jarred M.D.   On: 05/31/2017 21:59   Dg Chest 2 View  Result Date: 06/01/2017 CLINICAL DATA:  Stroke.  Headache and right-sided weakness. EXAM: CHEST  2 VIEW COMPARISON:  Radiographs 06/18/2016 FINDINGS: The cardiomediastinal contours are unchanged, heart size upper limits normal. The lungs are clear. Pulmonary vasculature is normal. No consolidation, pleural effusion, or pneumothorax. No acute osseous abnormalities are seen. Shunt catheter tubing noted in the right upper quadrant of the abdomen. IMPRESSION: No active cardiopulmonary disease. Electronically Signed   By: Jeb Levering M.D.   On: 06/01/2017 01:05   Ct Angio Neck W And/or Wo Contrast  Result Date: 05/31/2017 CLINICAL DATA:  Headache and right arm weakness EXAM: CT ANGIOGRAPHY HEAD AND NECK TECHNIQUE: Multidetector CT imaging of the head and neck was performed using the standard protocol during bolus administration of intravenous contrast. Multiplanar CT  image reconstructions and MIPs were obtained to evaluate the vascular anatomy. Carotid stenosis measurements (when applicable) are obtained utilizing NASCET criteria, using the distal internal carotid diameter as the denominator. CONTRAST:  50 mL Isovue 370  COMPARISON:  None. FINDINGS: CT HEAD FINDINGS Brain: No mass lesion, intraparenchymal hemorrhage or extra-axial collection. No evidence of acute cortical infarct. Brain parenchyma and CSF-containing spaces are normal for age. Vascular: No hyperdense vessel or unexpected calcification. Skull: Normal visualized skull base, calvarium and extracranial soft tissues. Sinuses/Orbits: No sinus fluid levels or advanced mucosal thickening. No mastoid effusion. Normal orbits. Review of the MIP images confirms the above findings CTA NECK FINDINGS Aortic arch: There is no aneurysm or dissection of the visualized ascending aorta or aortic arch. Normal 3 vessel aortic branching pattern. The visualized proximal subclavian arteries are normal. Mild calcific aortic atherosclerosis. Right carotid system: The right common carotid origin is widely patent. There is no common carotid or internal carotid artery dissection or aneurysm. There is mixed calcified and noncalcified plaque at the right carotid bifurcation without hemodynamically significant stenosis. Left carotid system: There is predominantly noncalcified plaque within the proximal left internal carotid artery. This results in stenosis that measures 60% by NASCET criteria. Subjectively, the stenosis appears severe. Vertebral arteries: The vertebral system is left dominant. Both vertebral artery origins are normal. Both vertebral arteries are normal to their confluence with the basilar artery. Skeleton: There is no bony spinal canal stenosis. No lytic or blastic lesions. Other neck: The nasopharynx is clear. The oropharynx and hypopharynx are normal. The epiglottis is normal. The supraglottic larynx, glottis and subglottic  larynx are normal. No retropharyngeal collection. The parapharyngeal spaces are preserved. The parotid and submandibular glands are normal. No sialolithiasis or salivary ductal dilatation. The thyroid gland is normal. There is no cervical lymphadenopathy. Upper chest: No pneumothorax or pleural effusion. No nodules or masses. Review of the MIP images confirms the above findings CTA HEAD FINDINGS Anterior circulation: --Intracranial internal carotid arteries: Normal. --Anterior cerebral arteries: Normal. --Middle cerebral arteries: Normal. --Posterior communicating arteries: Present bilaterally. Posterior circulation: --Posterior cerebral arteries: Normal. --Superior cerebellar arteries: Normal. --Basilar artery: Normal. --Anterior inferior cerebellar arteries: Normal. --Posterior inferior cerebellar arteries: Normal. Venous sinuses: As permitted by contrast timing, patent. Anatomic variants: None Delayed phase: No parenchymal contrast enhancement. Review of the MIP images confirms the above findings IMPRESSION: 1. No emergent large vessel occlusion. 2. Large amount of noncalcified plaque at the proximal left internal carotid artery, causing 60-65% stenosis. This is the most likely source of emboli for the previously identified left parietal and occipital infarcts. 3. No other intracranial or cervical arterial stenosis. 4. Minimal Aortic Atherosclerosis (ICD10-I70.0). Electronically Signed   By: Ulyses Jarred M.D.   On: 05/31/2017 21:59   Based on my review of this patient's neck CTA, the LICA appears to have a complex plaque that likely >70% stenosis.  There is plaque vs thrombus distal and adjacent to the calcific plaque present.  The disease appears to extend slightly past angle of L mandible.  The proximal CCA appears to be long enough for a TCAR.   MRI Head (05/30/17)  Multiple small areas of acute infarct in the left parietal cortex and left occipital cortex.   Recommend CTA head and neck to evaluate  for source of emboli or stenosis causing these infarcts  Numerous chronic white matter infarcts bilaterally   Non-invasive Vascular Imaging   B Carotid Duplex (06/01/2017):  R ICA stenosis:  1-39% R VA: patent and antegrade L ICA stenosis:  40-59%i L VA: patent and antegrade  TTE (06/01/2017):  - Left ventricle: The cavity size was normal. Systolic function was   normal. The estimated ejection fraction was in the range of 60%  to 65%. Wall motion was normal; there were no regional wall   motion abnormalities. Doppler parameters are consistent with   abnormal left ventricular relaxation (grade 1 diastolic   dysfunction).   Laboratory   CBC CBC Latest Ref Rng & Units 05/31/2017 05/31/2017 02/06/2015  WBC 4.0 - 10.5 K/uL - 11.1(H) -  Hemoglobin 12.0 - 15.0 g/dL 12.9 11.9(L) 11.9(L)  Hematocrit 36.0 - 46.0 % 38.0 36.6 35.0(L)  Platelets 150 - 400 K/uL - 346 -    BMP BMP Latest Ref Rng & Units 05/31/2017 05/31/2017 01/15/2016  Glucose 65 - 99 mg/dL 306(H) 297(H) 327(H)  BUN 6 - 20 mg/dL 25(H) 21(H) 14  Creatinine 0.44 - 1.00 mg/dL 1.20(H) 1.18(H) 1.06(H)  BUN/Creat Ratio 11 - 26 - - 13  Sodium 135 - 145 mmol/L 134(L) 133(L) 137  Potassium 3.5 - 5.1 mmol/L 4.5 4.5 5.4(H)  Chloride 101 - 111 mmol/L 102 101 94(L)  CO2 22 - 32 mmol/L - 23 23  Calcium 8.9 - 10.3 mg/dL - 9.4 10.2    Coagulation Lab Results  Component Value Date   INR 0.99 05/31/2017   No results found for: PTT  Lipids No results found for: CHOL, TRIG, HDL, CHOLHDL, VLDL, LDLCALC, LDLDIRECT    Medical Decision Making   [name] is a 66 y.o. female who presents with: B chronic CVA, L acute CVA, possible sx L ICA stenosis likely >70%   I agree that the CTA looks suspicious in this patient for a LICA source for her CVA.  However, the disease appears to extend past the angle of the mandible.  To get a more exact determination of lesion position, I recommend: B carotid and cerebral angiogram. I  discussed with the patient the nature of angiographic procedures, especially the limited patencies of any endovascular intervention.   The patient is aware of that the risks of an angiographic procedure include but are not limited to: bleeding, infection, access site complications, renal failure, embolization, rupture of vessel, dissection, arteriovenous fistula, possible need for emergent surgical intervention, possible need for surgical procedures to treat the patient's pathology, anaphylactic reaction to contrast, and stroke and death.   The patient is considering proceeding.  She is scheduled for this coming Thursday, 06/02/17.  If the lesion is high, would evaluate for femoral CAS vs TCAR.  Otherwise, would offer her L CEA in 2-4 weeks. I discussed in depth with the patient the nature of atherosclerosis, and emphasized the importance of maximal medical management including strict control of blood pressure, blood glucose, and lipid levels, obtaining regular exercise, antiplatelet agents, and cessation of smoking.   The patient is currently not on a statin due to reported allergy.  The patient is currently on an anti-platelet: ASA.  The patient is aware that without maximal medical management the underlying atherosclerotic disease process will progress, limiting the benefit of any interventions.  Thank you for allowing [place] to participate in this patient's care.   Adele Barthel, MD, FACS [facility] Office: [phone] Pager: [phone]  06/01/2017, 3:57 PM

## 2017-06-03 NOTE — Anesthesia Procedure Notes (Signed)
Procedure Name: Intubation Performed by: Kyung Rudd Pre-anesthesia Checklist: Patient identified, Emergency Drugs available, Suction available and Patient being monitored Patient Re-evaluated:Patient Re-evaluated prior to induction Oxygen Delivery Method: Circle System Utilized Preoxygenation: Pre-oxygenation with 100% oxygen Induction Type: IV induction Ventilation: Mask ventilation without difficulty Laryngoscope Size: Miller and 2 Grade View: Grade I Tube type: Oral Tube size: 7.5 mm Number of attempts: 1 Airway Equipment and Method: Stylet and Oral airway Placement Confirmation: ETT inserted through vocal cords under direct vision,  positive ETCO2 and breath sounds checked- equal and bilateral Secured at: 20 cm Tube secured with: Tape Dental Injury: Teeth and Oropharynx as per pre-operative assessment  Comments: Intubated by Jeani Hawking paxton

## 2017-06-03 NOTE — Progress Notes (Signed)
PT Cancellation Note  Patient Details Name: [name] MRN: [id number] DOB: 02-25-1951   Cancelled Treatment:    Reason Eval/Treat Not Completed: Patient at procedure or test/unavailable. PT will continue to f/u with pt as available.   [place] 06/03/2017, 11:08 AM

## 2017-06-03 NOTE — Code Documentation (Signed)
66yo female admitted to the hospital on 05/31/2017 with acute and subacute small pathcy infarcts in the left MCA distribution on MRI on 05/30/2017.  Patient with resultant mild right hand weakness per MD note.  Patient to cath lab on 06/03/2017 for diagnostic carotid and cerebral angiograms.  LKW [id number] which was the case start time per EMR.  During the procedure patient suddenly became unresponsive with reported left sided weakness.  Cath lab staff report left sided "contractions" on stroke team arrival.  NIHSS 24, see documentation for details and code stroke times.  Patient nonverbal and would not open eyes or follow commands on exam.  Patient with roaming eyes, moving right side spontaneously and withdrawing to pain on the left side on exam.  Patient to CT with stroke team.  CT head and CTA head and neck completed.  Dr. Erlinda Hong at the bedside.  Patient is not a candidate for tPA d/t recent infarcts.  Patient back to cath lab holding.  CTA showing right M1 embolus with subtotal occlusion but severe stenosis. The anterior temporal branch is covered and non-opacified.  Elongated embolus within the left M1 and proximal M2 vessel. Carotid bifurcation atherosclerosis with approximately 50-60% left ICA bulb narrowing. Visually the stenosis appears worse due to positive remodeling at the low-density plaque per report.  Patient transferred to IR and bedside handoff provided to IR team.  Patient's daughter escorted to IR by [facility] staff.  Daughter consented for procedure.  Patient to go to [facility] ICU following procedure.

## 2017-06-03 NOTE — Progress Notes (Signed)
Patient ID: [name], female   DOB: [birth year], 66 y.o.   MRN: [id number] INR. 66 yr old rt handed female with sudden neurological changes towards end of  a diagnostic arteriogram I. admittedwith Lt MCA embolic strokes 0n 8/67 /61.. CT brain No ICH. CTA bilateral MCA M 1 occlusions and Lt ICA 60 % stenosis. D/W patients daughter option of endovascular revascularization of the MCAs to prevent further neurological injury.Procedure risks benefits alternatives discussed.Risks of ICH 10 % ,worsening neuro function with vent dependency death and inability to revascularize all reviiewd..Questions answered.Informed witnessed consent obtained.for revascularization of both MCAs and ?Lt ICA under GA. S.Dhyan Noah.MD

## 2017-06-03 NOTE — Interval H&P Note (Signed)
History and Physical Interval Note:  06/03/2017 9:15 AM  [name]  has presented today for surgery, with the diagnosis of bilateral carotid stenosis  The various methods of treatment have been discussed with the patient and family. After consideration of risks, benefits and other options for treatment, the patient has consented to  Procedure(s): Bilateral Carotid Angiography (Bilateral) as a surgical intervention .  The patient's history has been reviewed, patient examined, no change in status, stable for surgery.  I have reviewed the patient's chart and labs.  Questions were answered to the patient's satisfaction.     Adele Barthel

## 2017-06-04 ENCOUNTER — Encounter (HOSPITAL_COMMUNITY): Payer: Self-pay | Admitting: Vascular Surgery

## 2017-06-04 ENCOUNTER — Inpatient Hospital Stay (HOSPITAL_COMMUNITY): Payer: Medicare Other

## 2017-06-04 DIAGNOSIS — E785 Hyperlipidemia, unspecified: Secondary | ICD-10-CM

## 2017-06-04 DIAGNOSIS — J9600 Acute respiratory failure, unspecified whether with hypoxia or hypercapnia: Secondary | ICD-10-CM

## 2017-06-04 LAB — GLUCOSE, CAPILLARY
GLUCOSE-CAPILLARY: 142 mg/dL — AB (ref 65–99)
GLUCOSE-CAPILLARY: 253 mg/dL — AB (ref 65–99)
GLUCOSE-CAPILLARY: 97 mg/dL (ref 65–99)
Glucose-Capillary: 95 mg/dL (ref 65–99)
Glucose-Capillary: 100 mg/dL — ABNORMAL HIGH (ref 65–99)
Glucose-Capillary: 85 mg/dL (ref 65–99)

## 2017-06-04 LAB — CBC WITH DIFFERENTIAL/PLATELET
BASOS ABS: 0 10*3/uL (ref 0.0–0.1)
Basophils Relative: 0 %
Eosinophils Absolute: 0 10*3/uL (ref 0.0–0.7)
Eosinophils Relative: 0 %
HEMATOCRIT: 27.8 % — AB (ref 36.0–46.0)
HEMOGLOBIN: 8.8 g/dL — AB (ref 12.0–15.0)
LYMPHS PCT: 7 %
Lymphs Abs: 1.1 10*3/uL (ref 0.7–4.0)
MCH: 27.1 pg (ref 26.0–34.0)
MCHC: 31.7 g/dL (ref 30.0–36.0)
MCV: 85.5 fL (ref 78.0–100.0)
Monocytes Absolute: 0.9 10*3/uL (ref 0.1–1.0)
Monocytes Relative: 6 %
NEUTROS ABS: 12.7 10*3/uL — AB (ref 1.7–7.7)
Neutrophils Relative %: 87 %
Platelets: 276 10*3/uL (ref 150–400)
RBC: 3.25 MIL/uL — AB (ref 3.87–5.11)
RDW: 14.2 % (ref 11.5–15.5)
WBC: 14.7 10*3/uL — AB (ref 4.0–10.5)

## 2017-06-04 LAB — MAGNESIUM
MAGNESIUM: 1.5 mg/dL — AB (ref 1.7–2.4)
MAGNESIUM: 1.5 mg/dL — AB (ref 1.7–2.4)

## 2017-06-04 LAB — BASIC METABOLIC PANEL
Anion gap: 7 (ref 5–15)
BUN: 18 mg/dL (ref 6–20)
CALCIUM: 7.6 mg/dL — AB (ref 8.9–10.3)
CHLORIDE: 109 mmol/L (ref 101–111)
CO2: 21 mmol/L — ABNORMAL LOW (ref 22–32)
CREATININE: 1.06 mg/dL — AB (ref 0.44–1.00)
GFR calc non Af Amer: 54 mL/min — ABNORMAL LOW (ref >60)
Glucose, Bld: 112 mg/dL — ABNORMAL HIGH (ref 65–99)
Potassium: 4.1 mmol/L (ref 3.5–5.1)
Sodium: 137 mmol/L (ref 135–145)

## 2017-06-04 LAB — PHOSPHORUS
PHOSPHORUS: 3.6 mg/dL (ref 2.5–4.6)
Phosphorus: 2.9 mg/dL (ref 2.5–4.6)

## 2017-06-04 LAB — MRSA PCR SCREENING: MRSA BY PCR: NEGATIVE

## 2017-06-04 MED ORDER — SODIUM CHLORIDE 0.9 % IV SOLN
INTRAVENOUS | Status: DC
  Administered 2017-06-04: 02:00:00 via INTRAVENOUS

## 2017-06-04 MED ORDER — SODIUM CHLORIDE 0.9 % IV BOLUS (SEPSIS)
1000.0000 mL | Freq: Once | INTRAVENOUS | Status: AC
  Administered 2017-06-04: 1000 mL via INTRAVENOUS

## 2017-06-04 MED ORDER — VITAL AF 1.2 CAL PO LIQD: 1000.0000 mL | ORAL | Status: DC

## 2017-06-04 MED ORDER — CHLORHEXIDINE GLUCONATE 0.12 % MT SOLN
OROMUCOSAL | Status: AC
  Filled 2017-06-04: qty 15

## 2017-06-04 MED ORDER — DEXTROSE-NACL 5-0.9 % IV SOLN
INTRAVENOUS | Status: DC
  Administered 2017-06-04 – 2017-06-08 (×2): via INTRAVENOUS

## 2017-06-04 NOTE — Progress Notes (Signed)
OT Cancellation Note  Patient Details Name: ANALICIA SKIBINSKI MRN: [id number] DOB: [birth year]   Cancelled Treatment:    Reason Eval/Treat Not Completed: Other (comment). Nsg requesting to hold today. Will follow up as appropriate.   Egan, [place] 06/04/2017 06/04/2017, 2:05 PM

## 2017-06-04 NOTE — Progress Notes (Signed)
Cortrak could not establish a post-pyloric NG tube therefore MD Byrum was contacted. He ordered to discontinue tube feeding, clamp the OG tube, and use the NG to deliver PO medications and see if the patient can tolerate.

## 2017-06-04 NOTE — Progress Notes (Signed)
PT Cancellation Note  Patient Details Name: [name] MRN: [id number] DOB: [birth year]   Cancelled Treatment:    Reason Eval/Treat Not Completed: Patient not medically ready. Discussed pt case with RN who states pt is not appropriate for therapies today. Will continue to follow and re-evaluate PT POC when pt is medically ready to participate.    Thelma Comp 06/04/2017, 10:45 AM  Rolinda Roan, PT, DPT Acute Rehabilitation Services Pager: [phone]

## 2017-06-04 NOTE — Progress Notes (Signed)
eLink Physician-Brief Progress Note Patient Name: [name] DOB: [birth year] MRN: [id number]   Date of Service  06/04/2017  HPI/Events of Note    eICU Interventions  IVF adjusted > NS changed to d5NS until coretrac and feeds can be started, rate decreased to 30cc/h     Intervention Category Minor Interventions: Electrolytes abnormality - evaluation and management  Reid Nawrot S. 06/04/2017, 9:30 PM

## 2017-06-04 NOTE — Progress Notes (Signed)
MRI resulted, revealing multiple small areas of acute infarct in the left parietal cortex and left occipital cortex. Numerous chronic white matter infarcts bilaterally were also noted.   The above findings are consistent with the clinical worsening yesterday in conjunction with CTA results showing multiple thrombi in bilateral M1 segments with some preserved distal flow.   Electronically signed: Dr. Kerney Elbe

## 2017-06-04 NOTE — Progress Notes (Signed)
Patient was assessed at this time, 2000. The NG coretrack was found not hooked to the bridle and had came out about 19 cm from the placement in the charting. Coretrack was clamped already, so it was removed to prevent any further aspiration. It was placed in a bag at the patient's head of bed. OG is still in place. Will continue to monitor.

## 2017-06-04 NOTE — Progress Notes (Signed)
eLink Physician-Brief Progress Note Patient Name: [name] DOB: August 26, 1951 MRN: [id number]   Date of Service  06/04/2017  HPI/Events of Note  Oliguria - No CVL. Last LVEF = 60 - 65%  eICU Interventions  Will order: 1. Bolus with 0.9 NaCl 1 liter IV ove 1 hour now.      Intervention Category Intermediate Interventions: Oliguria - evaluation and management  Lovelle Deitrick Eugene 06/04/2017, 6:13 AM

## 2017-06-04 NOTE — Progress Notes (Signed)
Cortrak Tube Team Note:  Consult received to place a Cortrak feeding tube.   A 10 F Cortrak tube was placed in the left nare and secured with a nasal bridle at 78 cm. Per the Cortrak monitor reading the tube tip is post-pyloric.   X-ray is required, abdominal x-ray has been ordered by the Cortrak team. Please confirm tube placement before using the Cortrak tube.   If the tube becomes dislodged please keep the tube and contact the Cortrak team at [email or web address] ([email or web address]) for replacement.  If after hours and replacement cannot be delayed, place a NG tube and confirm placement with an abdominal x-ray.   Kerman Passey MS, RD, LDN [phone] Pager  [phone] Weekend/On-Call Pager

## 2017-06-04 NOTE — Progress Notes (Signed)
Initial Nutrition Assessment  DOCUMENTATION CODES:   Not applicable  INTERVENTION:    Vital AF 1.2 at 50 ml/h (1200 ml per day)   Provides 1440 kcal, 90 gm protein, 973 ml free water daily  NUTRITION DIAGNOSIS:   Inadequate oral intake related to inability to eat as evidenced by NPO status.  GOAL:   Patient will meet greater than or equal to 90% of their needs  MONITOR:   Vent status, TF tolerance, Labs, I & O's  REASON FOR ASSESSMENT:   Consult Enteral/tube feeding initiation and management  ASSESSMENT:   66 yo female with PMH of GERD, seizures, anemia, HTN, DM, peripheral neuropathy, HLD, pseudotumor cerebri who was admitted on 8/13 with acute R hemispheric stroke s/p thrombectomy 8/16, remained intubated afterward.   Patient is currently intubated on ventilator support MV: 7 L/min Temp (24hrs), Avg:97.8 F (36.6 C), Min:97.3 F (36.3 C), Max:98.2 F (36.8 C)   Received MD Consult for TF initiation and management. Discussed patient with RN. Unable to begin TF yesterday due to vomiting. More vomiting with meds this morning. Cortrak has been placed, but needs to be further advanced to post-pyloric position.   Nutrition-Focused physical exam completed. Findings are no fat depletion, no muscle depletion, and no edema.   Labs reviewed: magnesium 1.5 (L) CBG's: 95-100 Medications reviewed.  Diet Order:  Diet NPO time specified  Skin:   (R groin incision)  Last BM:  8/15  Height:   Ht Readings from Last 1 Encounters:  06/03/17 5\' 4"  (1.626 m)    Weight:   Wt Readings from Last 1 Encounters:  06/04/17 169 lb 5 oz (76.8 kg)   05/31/17 161 lb 8 oz (73.256 kg) on admission  Ideal Body Weight:  54.5 kg  BMI:  Body mass index is 29.06 kg/m.  Estimated Nutritional Needs:   Kcal:  1400  Protein:  90-105 gm  Fluid:  1.5 L  EDUCATION NEEDS:   No education needs identified at this time  Molli Barrows, [place], [place], [place] Pager [phone] After  Hours Pager [phone]

## 2017-06-04 NOTE — Progress Notes (Addendum)
   Daily Progress Note   Assessment/Planning:   POD #1 s/p Dx Arch Ao, B carotid angiogram complicated by B embolization ? B mech thrombectomy   B cerebral embolization:   In retrospect, I suspect the arch aortogram which was completed with a power injector might have been the cause of the embolization, as there was minimal effort needed to select the R CCA and nearly none for the L CCA.  There is evidence of decreased anterior to posterior flow on the left cerebral angiogram, suggesting the embolization had already occurred by the time L CCA selection was done.  Given the relative lack of disease on aortogram and CTA, I am surprised that the arch aortogram resulted in such.  Appreciate NeuroIR rapid resolution of the embolism.  L ICA stenosis:   I against agree that the CTA looks worse than the angiogram.  The poor outcome from this diagnostic procedure suggests transfemoral intervention on this carotid is not suggested.  The angiogram verifies my concerns with a L CEA as there is a real possibility that the lesion is high.  In the event the patient recovers, may consider L TCAR at some point.  Will continue to check on pt periodically  I discussed the events of the case with the family   Subjective  - 1 Day Post-Op   Events overnight noted, patient still sedated and intubated   Objective   Vitals:   06/04/17 0500 06/04/17 0600 06/04/17 0700 06/04/17 0715  BP: (!) 177/54 (!) 131/119 112/64 (!) 132/118  Pulse: 63     Resp: 20 15 16  (!) 21  Temp:      TempSrc:      SpO2: 100%     Weight: 169 lb 5 oz (76.8 kg)     Height:         Intake/Output Summary (Last 24 hours) at 06/04/17 0732 Last data filed at 06/04/17 0600  Gross per 24 hour  Intake          4601.63 ml  Output              705 ml  Net          3896.63 ml    PULM  intubated  CV  RRR  NEURO Moving all extremities spontaneously, sedated    Laboratory   CBC CBC Latest Ref Rng & Units 06/04/2017  06/03/2017 05/31/2017  WBC 4.0 - 10.5 K/uL 14.7(H) 12.7(H) -  Hemoglobin 12.0 - 15.0 g/dL 8.8(L) 9.8(L) 12.9  Hematocrit 36.0 - 46.0 % 27.8(L) 30.5(L) 38.0  Platelets 150 - 400 K/uL 276 322 -    BMET    Component Value Date/Time   NA 137 06/04/2017 0445   NA 137 01/15/2016 1632   K 4.1 06/04/2017 0445   CL 109 06/04/2017 0445   CO2 21 (L) 06/04/2017 0445   GLUCOSE 112 (H) 06/04/2017 0445   BUN 18 06/04/2017 0445   BUN 14 01/15/2016 1632   CREATININE 1.06 (H) 06/04/2017 0445   CALCIUM 7.6 (L) 06/04/2017 0445   GFRNONAA 54 (L) 06/04/2017 0445   GFRAA >60 06/04/2017 0445     Adele Barthel, MD, FACS [facility] Office: [phone] Pager: [phone]  06/04/2017, 7:32 AM

## 2017-06-04 NOTE — Progress Notes (Signed)
Transported pt from 4N21 to MRI and back to 8E82 with no complications.

## 2017-06-04 NOTE — Progress Notes (Signed)
STROKE TEAM PROGRESS NOTE   SUBJECTIVE (INTERVAL HISTORY) Her husband, daughter, and sister are at the bedside.  Discussed yesterday's procedures with family and answered their questions.   No acute events overnight.  Pt intubated and on fentanyl for sedation.  Opens eyes to voice.  Does not follow commands.  Bilateral upper extremity decorticate posturing, R > L.  Correlates with 06/04/2017 MRI showing numerous acute new supra- and infratentorial infarcts spanning multiple vascular territories consistent with embolic phenomena.  Discussed respiratory status with Dr. Lamonte Sakai, continuing mechanical ventilation due to poor mental status and decorticate posturing on exam today.  Per her nurse, the patient is vomiting up gastric tube feeds.  Bowel sounds hypoactive in all quadrants.  Coretrak is post-pyloric on KUB today, no signs of ileus on scan.  Hx PONV.  Clevidipine now off.  No seizures on EEG.  OBJECTIVE Temp:  [97.3 F (36.3 C)-98.2 F (36.8 C)] 97.8 F (36.6 C) (08/17 0800) Pulse Rate:  [52-104] 55 (08/17 1200) Cardiac Rhythm: Sinus bradycardia (08/17 0400) Resp:  [14-22] 16 (08/17 1200) BP: (91-178)/(49-119) 169/56 (08/17 1200) SpO2:  [100 %] 100 % (08/17 1200) Arterial Line BP: (106-204)/(52-95) 114/58 (08/17 0100) FiO2 (%):  [40 %-50 %] 40 % (08/17 0400) Weight:  [76.8 kg (169 lb 5 oz)] 76.8 kg (169 lb 5 oz) (08/17 0500)  CBC:   Recent Labs Lab 05/31/17 1627  06/03/17 1352 06/04/17 0445  WBC 11.1*  --  12.7* 14.7*  NEUTROABS 8.3*  --   --  12.7*  HGB 11.9*  < > 9.8* 8.8*  HCT 36.6  < > 30.5* 27.8*  MCV 85.1  --  84.7 85.5  PLT 346  --  322 276  < > = values in this interval not displayed.  Basic Metabolic Panel:   Recent Labs Lab 06/03/17 0537 06/03/17 1352  06/03/17 1700 06/04/17 0445  NA 137  --   --   --  137  K 4.4  --   --   --  4.1  CL 103  --   --   --  109  CO2 23  --   --   --  21*  GLUCOSE 165*  --   --   --  112*  BUN 23*  --   --   --  18  CREATININE  1.00 1.01*  --   --  1.06*  CALCIUM 9.6  --   --   --  7.6*  MG  --   --   < > 1.4* 1.5*  PHOS  --   --   < > 4.5 3.6  < > = values in this interval not displayed.  Lipid Panel:     Component Value Date/Time   CHOL 297 (H) 06/02/2017 0741   TRIG 184 (H) 06/02/2017 0741   HDL 49 06/02/2017 0741   CHOLHDL 6.1 06/02/2017 0741   VLDL 37 06/02/2017 0741   LDLCALC 211 (H) 06/02/2017 0741   HgbA1c:  Lab Results  Component Value Date   HGBA1C 9.5 (H) 06/02/2017   Urine Drug Screen: No results found for: LABOPIA, COCAINSCRNUR, LABBENZ, AMPHETMU, THCU, LABBARB  Alcohol Level No results found for: [place] I have personally reviewed the radiological images below and agree with the radiology interpretations.  Ct Angio Head and neck W Or Wo Contrast 05/31/2017 IMPRESSION: 1. No emergent large vessel occlusion. 2. Large amount of noncalcified plaque at the proximal left internal carotid artery, causing 60-65% stenosis. This is  the most likely source of emboli for the previously identified left parietal and occipital infarcts. 3. No other intracranial or cervical arterial stenosis. 4. Minimal Aortic Atherosclerosis (ICD10-I70.0).   Mr Jeri Cos Wo Contrast 05/30/2017 IMPRESSION: Multiple small areas of acute infarct in the left parietal cortex and left occipital cortex. Recommend CTA head and neck to evaluate for source of emboli or stenosis causing these infarcts Numerous chronic white matter infarcts bilaterally   CUS 06/01/2017 1-39% right internal carotid artery stenosis and 40-59% left internal carotid artery stenosis. Vertebral arteries are patent with antegrade flow.  TTE 8/114/2018  - Left ventricle: The cavity size was normal. Systolic function was normal. The estimated ejection fraction was in the range of 60% to 65%. Wall motion was normal; there were no regional wall motion abnormalities. Doppler parameters are consistent with abnormal left ventricular relaxation (grade 1  diastolic dysfunction).  Carotid Angiography 06/03/2017 FINDING(S):  Type I aortic arch: widely patent  Innominate artery: widely patent ? R common carotid artery: widely patent ? R internal carotid artery: minimal disease, diffuse atherosclerotic plaque suggested ? R external carotid artery: minimal disease, diffuse atherosclerotic plaque suggested ? R subclavian artery: patent ? R vertebral artery: patent proximally  Left common carotid artery: patent ? L internal carotid artery: patent, 43% stenosis by NASCET proximally ? L external carotid artery: patent  Left subclavian artery: patent ? Left vertebral artery: patent  Intracranial findings to be read by Neuroradiology  CT Head Code Stroke  06/03/2017 IMPRESSION: 1. No acute finding or change from 3 days ago. 2. 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 Chronic small vessel ischemic changes elsewhere. are stable  CTA Head/Neck 06/03/2017 IMPRESSION: 1. Right M1 embolus with subtotal occlusion but severe stenosis. The anterior temporal branch is covered and non-opacified.  2. Elongated embolus within the left M1 and proximal M2 vessels. 3. Negative for dissection or interval ulceration in the arch and cervical carotids. 4. Carotid bifurcation atherosclerosis with approximately 50-60% left ICA bulb narrowing. Visually the stenosis appears worse due to positive remodeling at the low-density plaque. 5. Moderate atheromatous narrowing of the right V4 and mid basilar.  CT Head WO Contrast 06/03/2017 IMPRESSION: 1. Several small foci of enhancement within bilateral parietal and left frontal cortex as well as left basal ganglia probably representing enhancing infarctions. 2. No large hemorrhage, focal mass effect, herniation, or hydrocephalus.  DG Chest Port 1 View 06/03/2017 IMPRESSION: 1. The ETT terminates 1.5 cm above the carina. Recommend withdrawing 1 cm. 2. New infiltrate in  the lateral left lung base is nonspecific.  Recommend attention on follow-up.  DSA 06/03/2017 S/P bilateral common carotid arteriogram,and Rt vert angiogram,followed by complete revascularization of occluded  MCAs M1 segments bilaterally, with x 1 pass with embotrap 10mmx 35 mm  Retrieval device achieving a TICI3 reperfusion bilaterally.  EEG 06/03/2017 This EEG is abnormal and findings are suggestive of mild generalized cerebral dysfunction. Intermittent arm twitching was not associated with any epileptiform activity.  MRI Brain Wo Contrast 06/04/2017 IMPRESSION: 1. 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 No hemorrhagic conversion. 2. Moderate chronic small vessel ischemic disease.    PHYSICAL EXAM  Temp:  [97.3 F (36.3 C)-98.2 F (36.8 C)] 97.8 F (36.6 C) (08/17 0800) Pulse Rate:  [52-104] 55 (08/17 1200) Resp:  [14-22] 16 (08/17 1200) BP: (91-178)/(49-119) 169/56 (08/17 1200) SpO2:  [100 %] 100 % (08/17 1200) Arterial Line BP: (106-204)/(52-95) 114/58 (08/17  0100) FiO2 (%):  [40 %-50 %] 40 % (08/17 0400) Weight:  [76.8 kg (169 lb 5 oz)] 76.8 kg (169 lb 5 oz) (08/17 0500)  General - obese, well developed, intubated on sedation unresponsive.  Ophthalmologic - Fundi not visualized.  Cardiovascular - Regular rate and rhythm.  Neuro - intubated on sedation. Not responsive on voice, eyes closed, on forced opening, PERRL, doll's eye present. Not blinking to visual threat and not tracking. Corneal and gag and cough present. No significant facial asymmetry. On pain stimulation bilateral upper extremity flexion with increased tone, bilateral lower extremity extension with increased tone, consistent with decorticate posturing. Babinski negative. Sensation, coordination and gait not tested.   ASSESSMENT/PLAN [name] is a 66 y.o. female with history of HTN, HLD, pseudomotor cerebri, DM   presenting with headache and weakness of right hand. She did not receive IV t-PA due to out of window. CTA head and neck showed left ICA 65% stenosis. Dr. Bridgett Larsson consulted. During cerebral angiogram this morning, patient had acute mental status decline, became unresponsive. Code stroke called.  Found to have M1 thrombus, status post mechanical thrombectomy. MRI 06/04/2017 with numerous acute new supra- and infratentorial infarcts spanning multiple vascular territories consistent with embolic phenomena.   Stroke - bilateral MCA acute thrombosis, likely related to cerebral angiogram procedure  Acute mental status decline during procedure  CT negative for acute hemorrhage or infarct  CTA head and neck showed bilateral MCA acute thrombosis  S/p mechanical thrombectomy with bilateral TICI3 reperfusion  EEG with no seizure activity  MRI showed bilateral supra-and infratentorial infarcts, consistent with cardioembolic phenomena  Decorticate posturing on exam  Continue DAPT and supportive care   Recent stroke: acute and subacute small pathcy infarcts in the left MCA distribution, possibly artery to artery embolic due to left ICA stenosis.     Resultant  Right hand subtle weakness  MRI head Multiple small areas of acute infarct in the left parietal cortex and left occipital cortex. Numerous chronic white matter infarcts bilaterally  CTA head and neck - Large amount of noncalcified plaque at the proximal left internal carotid artery, causing 60-65% stenosis.   2D Echo  EF 60-65%  CUS - left ICA 40-59% stenosis  Cerebral angio < 50% left ICA stenosis  LDL 211  HgbA1c 9.5  SCDs for VTE prophylaxis Diet NPO time specified  aspirin 81 mg daily prior to admission, now on aspirin 325 mg daily and clopidogrel 75 mg daily. Recommend DAPT for 3 months and then plavix alone according to POINT study.   Patient counseled to be compliant with her antithrombotic medications  Ongoing aggressive  stroke risk factor management  Therapy recommendations: None  Disposition:  Pending  Left ICA stenosis  CTA neck - left ICA 60-65%  CUS - left ICA 40-59%  DSA: left ICA < 50% stenosis  No intervention needed at this time  Continue follow-up as outpatient  Respiratory failure  Intubated  On ventilation  CCM on board  Hypertension  Stable Off clevidipine SBP < 160 mmHg Long-term BP goal normotensive  Hyperlipidemia  Home meds:  none  LDL 211, goal < 70  Patient not tolerating statins. self-reports leg numbness, chest pain, severe headaches with statins on multiple occassions with multiple providers  Recommend follow up with PCP Dr. Felipa Eth for consideration of PCSK9 inhibitor. Mediation name written to pt.  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 Jaynee Eagles at [facility] day # 4  This patient is critically ill due to code stroke status, acute mental status change, bilateral MCA thrombosis status post thrombectomy and at significant risk of neurological worsening, death form recurrent stroke, hemorrhagic transformation, seizure, brain edema, cerebral herniation. This patient's care requires constant monitoring of vital signs, hemodynamics, respiratory and cardiac monitoring, review of multiple databases, neurological assessment, discussion with family, other specialists and medical decision making of high complexity. I spent 40 minutes of neurocritical care time in the care of this patient. I had long discussion with patient and husband, daughter and sister at bedside, updated pt current condition, treatment plan and potential prognosis. They expressed understanding and appreciation.    Rosalin Hawking, MD PhD Stroke  Neurology 06/04/2017 12:16 PM    To contact Stroke Continuity provider, please refer to [email or web address] After hours, contact General Neurology

## 2017-06-04 NOTE — Progress Notes (Addendum)
PULMONARY / CRITICAL CARE MEDICINE   Name: DIKSHA TAGLIAFERRO MRN: [id number] DOB: 1951/01/26    ADMISSION DATE:  05/31/2017 CONSULTATION DATE:  8/16  REFERRING MD:  Erlinda Hong   CHIEF COMPLAINT:  Vent management   HISTORY OF PRESENT ILLNESS:   66yo female with hx HTN, DM who presented initially 8/13 to her PCP with headache, R hight weakness and vision changes.  She was sent for MRI which revealed multiple areas of acute L MCA infarct.  She was sent to ER and admitted for CVA workup. On 8/16 pt was in cath lab for diagnostic carotid and cerebral angiogram with vascular surgery when she became suddenly unresponsive with L sided weakness and possible L sided seizure-like activity.  CTA revealed bilateral M1 occlusions.  She was intubated and taken urgently to IR for revascularization and rapid resolution of embolism.  PCCM consulted for ICU/vent management post procedure.   SUBJECTIVE:  Oliguria overnight, bolused with 1 L NS. No CVL for monitoring  VITAL SIGNS: BP 137/85   Pulse (!) 52   Temp 97.7 F (36.5 C) (Axillary)   Resp 16   Ht 5\' 4"  (1.626 m)   Wt 169 lb 5 oz (76.8 kg)   SpO2 100%   BMI 29.06 kg/m   HEMODYNAMICS:    VENTILATOR SETTINGS: Vent Mode: PRVC FiO2 (%):  [40 %-50 %] 40 % Set Rate:  [16 bmp] 16 bmp Vt Set:  [440 mL-460 mL] 440 mL PEEP:  [5 cmH20] 5 cmH20 Plateau Pressure:  [10 cmH20-17 cmH20] 10 cmH20  INTAKE / OUTPUT: I/O last 3 completed shifts: In: 4709.1 [I.V.:2499.8; NG/GT:209.3; IV Piggyback:2000] Out: 805 [Urine:805]  PHYSICAL EXAMINATION: General:  Ill appearing female intubated and sedated on vent Neuro:  Sedated on vent, RASS -2 on fentanyl HEENT:  Mm moist, ETT, normocephalic/ atraumatic Cardiovascular:  s1s2 rrr, No MRG Lungs:  resps even non labored on vent, clear throughout, synchronous with vent  Abdomen:  Round, soft, +bs  Musculoskeletal:  Warm and dry, no sig edema Skin: Clean dry intact no rash or lesions  LABS:  BMET  Recent  Labs Lab 05/31/17 1627 05/31/17 1647 06/03/17 0537 06/03/17 1352 06/04/17 0445  NA 133* 134* 137  --  137  K 4.5 4.5 4.4  --  4.1  CL 101 102 103  --  109  CO2 23  --  23  --  21*  BUN 21* 25* 23*  --  18  CREATININE 1.18* 1.20* 1.00 1.01* 1.06*  GLUCOSE 297* 306* 165*  --  112*    Electrolytes  Recent Labs Lab 05/31/17 1627 06/03/17 0537 06/03/17 1424 06/03/17 1700 06/04/17 0445  CALCIUM 9.4 9.6  --   --  7.6*  MG  --   --  1.5* 1.4* 1.5*  PHOS  --   --  3.1 4.5 3.6    CBC  Recent Labs Lab 05/31/17 1627 05/31/17 1647 06/03/17 1352 06/04/17 0445  WBC 11.1*  --  12.7* 14.7*  HGB 11.9* 12.9 9.8* 8.8*  HCT 36.6 38.0 30.5* 27.8*  PLT 346  --  322 276    Coag's  Recent Labs Lab 05/31/17 1627  APTT 25  INR 0.99    Sepsis Markers No results for input(s): LATICACIDVEN, PROCALCITON, O2SATVEN in the last 168 hours.  ABG  Recent Labs Lab 06/03/17 1417  PHART 7.431  PCO2ART 31.0*  PO2ART 206*    Liver Enzymes  Recent Labs Lab 05/31/17 1627  AST 43*  ALT 26  ALKPHOS 159*  BILITOT 0.4  ALBUMIN 3.8    Cardiac Enzymes No results for input(s): TROPONINI, PROBNP in the last 168 hours.  Glucose  Recent Labs Lab 06/03/17 1004 06/03/17 1547 06/03/17 2050 06/04/17 0027 06/04/17 0406 06/04/17 0750  GLUCAP 101* 173* 198* 253* 142* 95    Imaging Ct Angio Head W Or Wo Contrast  Result Date: 06/03/2017 CLINICAL DATA:  Upper extremity deficits and unresponsiveness. Carotid catheterization. EXAM: CT ANGIOGRAPHY HEAD AND NECK TECHNIQUE: Multidetector CT imaging of the head and neck was performed using the standard protocol during bolus administration of intravenous contrast. Multiplanar CT image reconstructions and MIPs were obtained to evaluate the vascular anatomy. Carotid stenosis measurements (when applicable) are obtained utilizing NASCET criteria, using the distal internal carotid diameter as the denominator. CONTRAST:  50 cc Isovue 370  intravenous COMPARISON:  CTA 3 days ago FINDINGS: CTA NECK FINDINGS Aortic arch: Mild atheromatous wall thickening and calcification. No ulceration. Three vessel branching. Right carotid system: Moderate calcified and noncalcified plaque on the posterior wall of the ICA bulb. No flow limiting stenosis. No ulceration or dissection. Left carotid system: Prominent proximally calcified and distally noncalcified atheromatous plaque on the ICA bulb with approximately 50-60% stenosis. The noncalcified plaque portion shows positive remodeling. Negative for dissection or interval ulceration. Vertebral arteries: No proximal subclavian stenosis. Mild left vertebral artery dominance. The vertebral arteries are widely patent to the dura. Skeleton: No acute or aggressive finding Other neck: No incidental mass or inflammation. Upper chest: No acute finding. Review of the MIP images confirms the above findings CTA HEAD FINDINGS Anterior circulation: Bilateral calcified plaque on the carotid siphons as seen previously. There is a mild supraclinoid ICA narrowing on the left. Luminal filling defect at the right M1 2 junction, over riding the anterior temporal branch which is nonenhancing. There is prompt downstream flow suggesting subtotal occlusion. There is in the elongated luminal clot within the left M1 extending into the main M2 branch with diminished flow but subtotal occlusion. Posterior circulation: Left dominant vertebral artery. Moderate proximal right V4 and mid basilar stenoses. Mild moderate atherosclerotic irregularity of bilateral PCA branches without branch occlusion. Fetal type right PCA. Venous sinuses: Patent as permitted by contrast timing Anatomic variants: Fetal type right PCA Delayed phase: Not obtained in the emergent setting. Images were reviewed in person with Dr. Erlinda Hong as the data set was being transported to PACs. Review of the MIP images confirms the above findings IMPRESSION: 1. Right M1 embolus with  subtotal occlusion but severe stenosis. The anterior temporal branch is covered and non-opacified. 2. Elongated embolus within the left M1 and proximal M2 vessels. 3. Negative for dissection or interval ulceration in the arch and cervical carotids. 4. Carotid bifurcation atherosclerosis with approximately 50-60% left ICA bulb narrowing. Visually the stenosis appears worse due to positive remodeling at the low-density plaque. 5. Moderate atheromatous narrowing of the right V4 and mid basilar. Electronically Signed   By: Monte Fantasia M.D.   On: 06/03/2017 11:06   Ct Head Wo Contrast  Result Date: 06/03/2017 CLINICAL DATA:  66 y/o  F; status posts IR intervention for stroke. EXAM: CT HEAD WITHOUT CONTRAST TECHNIQUE: Contiguous axial images were obtained from the base of the skull through the vertex without intravenous contrast. COMPARISON:  CT head and CTA head dated 06/03/2017. MRI head dated 05/30/2017. FINDINGS: Brain: Persisting contrast from recent intervention. There are several foci of enhancement present within bilateral parietal and left frontal cortex as well as within the left basal ganglia likely representing enhancing  infarcts. No large hemorrhage identified, a small hemorrhage may be obscured by the presence of contrast. No focal mass effect, herniation, or extra-axial collection. Background of Vascular: Central circle of Jannifer Franklin is enhancing as are the large dural venous sinuses. Skull: Normal. Negative for fracture or focal lesion. Sinuses/Orbits: No acute finding. Other: None. IMPRESSION: 1. Several small foci of enhancement within bilateral parietal and left frontal cortex as well as left basal ganglia probably representing enhancing infarctions. 2. No large hemorrhage, focal mass effect, herniation, or hydrocephalus. Electronically Signed   By: Kristine Garbe M.D.   On: 06/03/2017 13:56   Ct Angio Neck W Or Wo Contrast  Result Date: 06/03/2017 CLINICAL DATA:  Upper extremity  deficits and unresponsiveness. Carotid catheterization. EXAM: CT ANGIOGRAPHY HEAD AND NECK TECHNIQUE: Multidetector CT imaging of the head and neck was performed using the standard protocol during bolus administration of intravenous contrast. Multiplanar CT image reconstructions and MIPs were obtained to evaluate the vascular anatomy. Carotid stenosis measurements (when applicable) are obtained utilizing NASCET criteria, using the distal internal carotid diameter as the denominator. CONTRAST:  50 cc Isovue 370 intravenous COMPARISON:  CTA 3 days ago FINDINGS: CTA NECK FINDINGS Aortic arch: Mild atheromatous wall thickening and calcification. No ulceration. Three vessel branching. Right carotid system: Moderate calcified and noncalcified plaque on the posterior wall of the ICA bulb. No flow limiting stenosis. No ulceration or dissection. Left carotid system: Prominent proximally calcified and distally noncalcified atheromatous plaque on the ICA bulb with approximately 50-60% stenosis. The noncalcified plaque portion shows positive remodeling. Negative for dissection or interval ulceration. Vertebral arteries: No proximal subclavian stenosis. Mild left vertebral artery dominance. The vertebral arteries are widely patent to the dura. Skeleton: No acute or aggressive finding Other neck: No incidental mass or inflammation. Upper chest: No acute finding. Review of the MIP images confirms the above findings CTA HEAD FINDINGS Anterior circulation: Bilateral calcified plaque on the carotid siphons as seen previously. There is a mild supraclinoid ICA narrowing on the left. Luminal filling defect at the right M1 2 junction, over riding the anterior temporal branch which is nonenhancing. There is prompt downstream flow suggesting subtotal occlusion. There is in the elongated luminal clot within the left M1 extending into the main M2 branch with diminished flow but subtotal occlusion. Posterior circulation: Left dominant  vertebral artery. Moderate proximal right V4 and mid basilar stenoses. Mild moderate atherosclerotic irregularity of bilateral PCA branches without branch occlusion. Fetal type right PCA. Venous sinuses: Patent as permitted by contrast timing Anatomic variants: Fetal type right PCA Delayed phase: Not obtained in the emergent setting. Images were reviewed in person with Dr. Erlinda Hong as the data set was being transported to PACs. Review of the MIP images confirms the above findings IMPRESSION: 1. Right M1 embolus with subtotal occlusion but severe stenosis. The anterior temporal branch is covered and non-opacified. 2. Elongated embolus within the left M1 and proximal M2 vessels. 3. Negative for dissection or interval ulceration in the arch and cervical carotids. 4. Carotid bifurcation atherosclerosis with approximately 50-60% left ICA bulb narrowing. Visually the stenosis appears worse due to positive remodeling at the low-density plaque. 5. Moderate atheromatous narrowing of the right V4 and mid basilar. Electronically Signed   By: Monte Fantasia M.D.   On: 06/03/2017 11:06   Mr Brain Wo Contrast  Result Date: 06/04/2017 CLINICAL DATA:  RIGHT handed weakness, and incoordination. Followup stroke. History of pseudotumor cerebral RIGHT, seizures, shunt failure, hypertension, diabetes. EXAM: MRI HEAD WITHOUT CONTRAST TECHNIQUE: Multiplanar, multiecho  pulse sequences of the brain and surrounding structures were obtained without intravenous contrast. COMPARISON:  MRI head May 30, 2017 and CT HEAD June 03, 2017 FINDINGS: BRAIN: Numerous new acute infarcts with low ADC values involving bilateral MCA, bilateral PCA territories with low ADC values. New infarcts included bilateral caudate, bifrontal parietal cortices. New cm LEFT cerebellar infarct. No susceptibility artifact to suggest hemorrhage. Similar patchy supratentorial white matter FLAIR T2 hyperintensities exclusive a aforementioned abnormality. No midline shift,  mass effect or masses. Ventricles and sulci are normal for patient's age. No abnormal extra-axial fluid collections. VASCULAR: Normal major intracranial vascular flow voids present at skull base. SKULL AND UPPER CERVICAL SPINE: Empty sella. No suspicious calvarial bone marrow signal. Craniocervical junction maintained. SINUSES/ORBITS: Included paranasal sinus are well aerated. Trace bilateral mastoid effusions. The included ocular globes and orbital contents are non-suspicious. Status post bilateral ocular lens implants. OTHER: Fluid layering in the oropharynx consistent with intubation. IMPRESSION: 1. 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 No hemorrhagic conversion. 2. Moderate chronic small vessel ischemic disease. These results will be called to the ordering clinician or representative by the Radiologist Assistant, and communication documented in the PACS or zVision Dashboard. Electronically Signed   By: Elon Alas M.D.   On: 06/04/2017 03:47   Dg Chest Portable 1 View  Result Date: 06/03/2017 CLINICAL DATA:  Respiratory failure EXAM: PORTABLE CHEST 1 VIEW COMPARISON:  May 31, 2016 FINDINGS: A new ET tube terminates 1.5 cm above the carina. Recommend withdrawing 1 cm. A new OG tube terminates in the left upper quadrant, likely in the stomach, with the side-port just below the GE junction. New opacity in the left lung base laterally is identified. No pneumothorax. No other interval changes or acute abnormalities. IMPRESSION: 1. The ETT terminates 1.5 cm above the carina. Recommend withdrawing 1 cm. 2. New infiltrate in the lateral left lung base is nonspecific. Recommend attention on follow-up. Electronically Signed   By: Dorise Bullion III M.D   On: 06/03/2017 15:57   Dg Abd Portable 1v  Result Date: 06/04/2017 CLINICAL DATA:  OG tube placement. EXAM: PORTABLE ABDOMEN - 1 VIEW COMPARISON:  Scout image for  CT scan dated 02/26/2016 FINDINGS: OG tube tip is in the body of the stomach. Bowel gas pattern is normal. No acute bone abnormality. IMPRESSION: OG tube tip in the body of the stomach. Electronically Signed   By: Lorriane Shire M.D.   On: 06/04/2017 10:04   Ct Head Code Stroke Wo Contrast  Result Date: 06/03/2017 CLINICAL DATA:  Code stroke.  Unresponsive. EXAM: CT HEAD WITHOUT CONTRAST TECHNIQUE: Contiguous axial images were obtained from the base of the skull through the vertex without intravenous contrast. COMPARISON:  05/31/2017 FINDINGS: Brain: Known recent small cortical infarcts in the posterior left cerebral hemisphere are not clearly visualized. Small remote right frontal parietal cortex infarcts, stable. Patchy low-density in the cerebral white matter from chronic small vessel ischemia, moderate. No acute hemorrhage. No evidence of interval infarct. No hydrocephalus or masslike findings. Vascular: Intravenous contrast. Major vessels appear symmetrically dense. Patient was in the cath lab this morning. Operative details are currently not known. Skull: Negative Sinuses/Orbits: Negative Other: Prelim via text page sent 06/03/2017 at 10:39 am to Dr. Rosalin Hawking. ASPECTS [facility] Stroke Program Early CT Score) Not scored in this setting. IMPRESSION: 1. No acute finding or change from 3 days ago. 2. 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  Chronic small vessel ischemic changes elsewhere are stable. Electronically Signed   By: Monte Fantasia M.D.   On: 06/03/2017 10:41     STUDIES:  MR brain 8/12>>> Multiple small areas of acute infarct in the left parietal cortex and left occipital cortex. Recommend CTA head and neck to evaluate for source of emboli or stenosis causing these infarcts. Numerous chronic white matter infarcts bilaterally CTA head 8/16>>> 1. Right M1 embolus with subtotal occlusion but severe stenosis. The anterior temporal branch is covered  and non-opacified. 2. Elongated embolus within the left M1 and proximal M2 vessels. 3. Negative for dissection or interval ulceration in the arch and cervical carotids. 4. Carotid bifurcation atherosclerosis with approximately 50-60% left ICA bulb narrowing. Visually the stenosis appears worse due to positive remodeling at the low-density plaque. 5. Moderate atheromatous narrowing of the right V4 and mid basilar. CT head 8/16>>> 1. Several small foci of enhancement within bilateral parietal and left frontal cortex as well as left basal ganglia probably representing enhancing infarctions. 2. No large hemorrhage, focal mass effect, herniation, or Hydrocephalus. 2D echo 8/14>>>  - Left ventricle: The cavity size was normal. Systolic function was normal. The estimated ejection fraction was in the range of 60% to 65%. Wall motion was normal; there were no regional wall motion abnormalities. Doppler parameters are consistent with abnormal left ventricular relaxation (grade 1 diastolic dysfunction). MRI:  06/04/2017>>> Numerous acute new supra- and infratentorial infarcts spanning multiple vascular territories consistent with embolic phenomena. Nohemorrhagic conversion.Moderate chronic small vessel ischemic disease.  CULTURES: None  ANTIBIOTICS: None  SIGNIFICANT EVENTS: 8/16 - sudden change in mental status, to IR for bilateral embolectomy for revascularization of bilat MCA occlusions   LINES/TUBES: ETT 8/16>>> R rad aline 8/16>>>  DISCUSSION: 66yo female admitted with acute L MCA stroke, then acute R MCA occlusion during diagnostic arteriogram with AMS requiring intubation.  Taken to IR for revascularization.   ASSESSMENT / PLAN:  PULMONARY Acute respiratory failure - r/t AMS  P:   Vent support - 8cc/kg  F/u CXR  F/u ABG SBT in am if mental status supports Titrate oxygen to maintain saturations> 94%    CARDIOVASCULAR Hx HTN  P:  Cleviprex infusion per neuro Prn  lobetalol Goal SBP 120-140   RENAL Creatinine 1.06 ( Increase last 24 hours) P:   Trend BMET/ Urine output Strict I&O Avoid nephrotoxic medications  GASTROINTESTINAL No active issue  P:   TF  PPI   HEMATOLOGIC Bilateral MCA occlusion  L ICA stenosis  P:  lovenox  plavix Monitor for bleeding Trend CBC daily   INFECTIOUS Leukocytosis>> ? reactive ? Infiltrate per CXR 8/17 Afebrile P:   Monitor wbc, fever curve off abx  Low threshold for ABX if becomes febrile Consider pan culture if appears clinically infectious  ENDOCRINE DM - Hgb A1c >9 P:   Lantus, SSI CBG Q 4  Will start TF now   NEUROLOGIC Bilateral acute MCA occlusion- s/p IR revascularization  Left ICA stenosis P:   RASS goal: -1 Per stroke team  WUA in am  Plavix, asa  Minimize sedation as able   FAMILY  - Updates: no family at bedside 8/17  - Inter-disciplinary family meet or Palliative Care meeting due by:  Day [place], AGACNP-BC 06/04/2017  10:24 AM Pager:  [phone]  Attending Note:  I have examined patient, reviewed labs, studies and notes. I have discussed the case with Gladstone Pih , and I agree with the data  and plans as amended above. 66 year old woman who experienced acute left MCA and right MCA occlusions while she was undergoing a diagnostic cerebral arteriogram. She was acutely altered, required intubation for airway protection. She went urgently for mechanical thrombectomy. She has remained hypertensive. Mild acute renal insufficiency. On evaluation she is poorly responsive. She has what looks like flexor posturing when stimulated. She does breathe spontaneously over the mechanical ventilator. Lungs are clear. Heart is regular without a murmur. Abdomen benign. She has a mild evolving leukocytosis, question stress reaction. No clear evidence currently of active infection. Follow off of antibiotics for now. Current mental status precludes spontaneous breathing trial for  extubation. We will continue to follow over the next several days for improvement in neurological status and ability to wean from ventilator. Independent critical care time is 34 minutes.   Baltazar Apo, MD, PhD 06/04/2017, 10:43 PM [facility] [phone] or if no answer [phone]

## 2017-06-04 NOTE — Progress Notes (Signed)
SLP Cancellation Note  Patient Details Name: [name] MRN: [id number] DOB: 09/28/1951   Cancelled treatment:       Reason Eval/Treat Not Completed: Medical issues which prohibited therapy. Pt intubated following procedure. Will f/u for cognitive linguistic assessment.    Amalea Ottey, Katherene Ponto 06/04/2017, 11:29 AM

## 2017-06-05 ENCOUNTER — Inpatient Hospital Stay (HOSPITAL_COMMUNITY): Payer: Medicare Other

## 2017-06-05 DIAGNOSIS — I6603 Occlusion and stenosis of bilateral middle cerebral arteries: Secondary | ICD-10-CM

## 2017-06-05 LAB — GLUCOSE, CAPILLARY
GLUCOSE-CAPILLARY: 116 mg/dL — AB (ref 65–99)
GLUCOSE-CAPILLARY: 85 mg/dL (ref 65–99)
GLUCOSE-CAPILLARY: 84 mg/dL (ref 65–99)
GLUCOSE-CAPILLARY: 124 mg/dL — AB (ref 65–99)
Glucose-Capillary: 117 mg/dL — ABNORMAL HIGH (ref 65–99)
Glucose-Capillary: 130 mg/dL — ABNORMAL HIGH (ref 65–99)
Glucose-Capillary: 64 mg/dL — ABNORMAL LOW (ref 65–99)

## 2017-06-05 LAB — BASIC METABOLIC PANEL
ANION GAP: 10 (ref 5–15)
BUN: 10 mg/dL (ref 6–20)
CALCIUM: 7.5 mg/dL — AB (ref 8.9–10.3)
CO2: 18 mmol/L — ABNORMAL LOW (ref 22–32)
CREATININE: 0.9 mg/dL (ref 0.44–1.00)
Chloride: 111 mmol/L (ref 101–111)
Glucose, Bld: 107 mg/dL — ABNORMAL HIGH (ref 65–99)
Potassium: 4.1 mmol/L (ref 3.5–5.1)
SODIUM: 139 mmol/L (ref 135–145)

## 2017-06-05 MED ORDER — DOCUSATE SODIUM 50 MG/5ML PO LIQD
100.0000 mg | Freq: Every day | ORAL | Status: DC
  Administered 2017-06-05 – 2017-06-18 (×8): 100 mg via ORAL
  Filled 2017-06-05 (×10): qty 10

## 2017-06-05 MED ORDER — DEXTROSE 50 % IV SOLN
25.0000 mL | Freq: Once | INTRAVENOUS | Status: AC
  Administered 2017-06-05: 25 mL via INTRAVENOUS

## 2017-06-05 MED ORDER — DEXTROSE 50 % IV SOLN
INTRAVENOUS | Status: AC
  Filled 2017-06-05: qty 50

## 2017-06-05 NOTE — Progress Notes (Signed)
STROKE TEAM PROGRESS NOTE   SUBJECTIVE (INTERVAL HISTORY) Daughter and sister are at bedside. Pt still intubated, failed weaning this am. However, pt more awake alert, open eyes, follows simple commands. BUE and LLE strong but RLE weaker.   OBJECTIVE Temp:  [97.8 F (36.6 C)-98.7 F (37.1 C)] 98.5 F (36.9 C) (08/18 0400) Pulse Rate:  [52-98] 68 (08/18 0700) Cardiac Rhythm: Normal sinus rhythm (08/18 0400) Resp:  [16-21] 16 (08/18 0700) BP: (125-174)/(46-87) 150/66 (08/18 0700) SpO2:  [100 %] 100 % (08/18 0700) FiO2 (%):  [40 %] 40 % (08/18 0415) Weight:  [171 lb 11.8 oz (77.9 kg)] 171 lb 11.8 oz (77.9 kg) (08/18 0500)  CBC:   Recent Labs Lab 05/31/17 1627  06/03/17 1352 06/04/17 0445  WBC 11.1*  --  12.7* 14.7*  NEUTROABS 8.3*  --   --  12.7*  HGB 11.9*  < > 9.8* 8.8*  HCT 36.6  < > 30.5* 27.8*  MCV 85.1  --  84.7 85.5  PLT 346  --  322 276  < > = values in this interval not displayed.  Basic Metabolic Panel:   Recent Labs Lab 06/04/17 0445 06/04/17 1827 06/05/17 0131  NA 137  --  139  K 4.1  --  4.1  CL 109  --  111  CO2 21*  --  18*  GLUCOSE 112*  --  107*  BUN 18  --  10  CREATININE 1.06*  --  0.90  CALCIUM 7.6*  --  7.5*  MG 1.5* 1.5*  --   PHOS 3.6 2.9  --     Lipid Panel:     Component Value Date/Time   CHOL 297 (H) 06/02/2017 0741   TRIG 184 (H) 06/02/2017 0741   HDL 49 06/02/2017 0741   CHOLHDL 6.1 06/02/2017 0741   VLDL 37 06/02/2017 0741   LDLCALC 211 (H) 06/02/2017 0741   HgbA1c:  Lab Results  Component Value Date   HGBA1C 9.5 (H) 06/02/2017   Urine Drug Screen: No results found for: LABOPIA, COCAINSCRNUR, LABBENZ, AMPHETMU, THCU, LABBARB  Alcohol Level No results found for: [place] I have personally reviewed the radiological images below and agree with the radiology interpretations.  Ct Angio Head and neck W Or Wo Contrast 05/31/2017 IMPRESSION: 1. No emergent large vessel occlusion. 2. Large amount of noncalcified plaque at  the proximal left internal carotid artery, causing 60-65% stenosis. This is the most likely source of emboli for the previously identified left parietal and occipital infarcts. 3. No other intracranial or cervical arterial stenosis. 4. Minimal Aortic Atherosclerosis (ICD10-I70.0).   Mr [name] Wo Contrast 05/30/2017 IMPRESSION: Multiple small areas of acute infarct in the left parietal cortex and left occipital cortex. Recommend CTA head and neck to evaluate for source of emboli or stenosis causing these infarcts Numerous chronic white matter infarcts bilaterally   CUS 06/01/2017 1-39% right internal carotid artery stenosis and 40-59% left internal carotid artery stenosis. Vertebral arteries are patent with antegrade flow.  TTE 8/114/2018  - Left ventricle: The cavity size was normal. Systolic function was normal. The estimated ejection fraction was in the range of 60% to 65%. Wall motion was normal; there were no regional wall motion abnormalities. Doppler parameters are consistent with abnormal left ventricular relaxation (grade 1 diastolic dysfunction).  Carotid Angiography 06/03/2017 FINDING(S):  Type I aortic arch: widely patent  Innominate artery: widely patent ? R common carotid artery: widely patent ? R internal carotid artery: minimal disease, diffuse atherosclerotic  plaque suggested ? R external carotid artery: minimal disease, diffuse atherosclerotic plaque suggested ? R subclavian artery: patent ? R vertebral artery: patent proximally  Left common carotid artery: patent ? L internal carotid artery: patent, 43% stenosis by NASCET proximally ? L external carotid artery: patent  Left subclavian artery: patent ? Left vertebral artery: patent  Intracranial findings to be read by Neuroradiology  CT Head Code Stroke  06/03/2017 IMPRESSION: 1. No acute finding or change from 3 days ago. 2. 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  Chronic small vessel ischemic changes elsewhere. are stable  CTA Head/Neck 06/03/2017 IMPRESSION: 1. Right M1 embolus with subtotal occlusion but severe stenosis. The anterior temporal branch is covered and non-opacified.  2. Elongated embolus within the left M1 and proximal M2 vessels. 3. Negative for dissection or interval ulceration in the arch and cervical carotids. 4. Carotid bifurcation atherosclerosis with approximately 50-60% left ICA bulb narrowing. Visually the stenosis appears worse due to positive remodeling at the low-density plaque. 5. Moderate atheromatous narrowing of the right V4 and mid basilar.  CT Head WO Contrast 06/03/2017 IMPRESSION: 1. Several small foci of enhancement within bilateral parietal and left frontal cortex as well as left basal ganglia probably representing enhancing infarctions. 2. No large hemorrhage, focal mass effect, herniation, or hydrocephalus.  DG Chest Port 1 View 06/03/2017 IMPRESSION: 1. The ETT terminates 1.5 cm above the carina. Recommend withdrawing 1 cm. 2. New infiltrate in the lateral left lung base is nonspecific.  Recommend attention on follow-up.  DSA 06/03/2017 S/P bilateral common carotid arteriogram,and Rt vert angiogram,followed by complete revascularization of occluded  MCAs M1 segments bilaterally, with x 1 pass with embotrap 46mmx 35 mm  Retrieval device achieving a TICI3 reperfusion bilaterally.  EEG 06/03/2017 This EEG is abnormal and findings are suggestive of mild generalized cerebral dysfunction. Intermittent arm twitching was not associated with any epileptiform activity.  MRI Brain Wo Contrast 06/04/2017 IMPRESSION: 1. 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 No hemorrhagic conversion. 2. Moderate chronic small vessel ischemic disease.    PHYSICAL EXAM  Temp:  [97.8 F (36.6 C)-98.7 F (37.1 C)] 98.5 F (36.9 C) (08/18  0400) Pulse Rate:  [52-98] 68 (08/18 0700) Resp:  [16-21] 16 (08/18 0700) BP: (125-174)/(46-87) 150/66 (08/18 0700) SpO2:  [100 %] 100 % (08/18 0700) FiO2 (%):  [40 %] 40 % (08/18 0415) Weight:  [171 lb 11.8 oz (77.9 kg)] 171 lb 11.8 oz (77.9 kg) (08/18 0500)  General - obese, well developed, intubated off sedation.  Ophthalmologic - Fundi not visualized.  Cardiovascular - Regular rate and rhythm.  Neuro - intubated off sedation. Eyes open and following simple peripheral and central commands. Eyes moving both direction on commands, but not complete due to lack of effort. PERRL, blinking to visual threat bilaterally and tracking. Facial symmetry not able to test due to ET tube. BUE symmetrial against gravities. LLE 3/5 at least, RLE 3-/5. DTR 1+ and no babinski. Sensation, coordination and gait not tested.   ASSESSMENT/PLAN Ms. [name] is a 66 y.o. female with history of HTN, HLD, pseudomotor cerebri, DM  presenting with headache and weakness of right hand. She did not receive IV t-PA due to out of window. CTA head and neck showed left ICA 65% stenosis. Dr. Bridgett Larsson consulted. During cerebral angiogram this morning, patient had acute mental status decline, became unresponsive. Code stroke called.  Found to have M1 thrombus,  status post mechanical thrombectomy. MRI 06/04/2017 with numerous acute new supra- and infratentorial infarcts spanning multiple vascular territories consistent with embolic phenomena.   Stroke - bilateral MCA acute thrombosis, likely related to cerebral angiogram procedure  Acute mental status decline during procedure  CT negative for acute hemorrhage or infarct  CTA head and neck showed bilateral MCA acute thrombosis  S/p mechanical thrombectomy with bilateral TICI3 reperfusion  EEG with no seizure activity  MRI showed bilateral supra-and infratentorial infarcts, consistent with cardioembolic phenomena  Continue DAPT and supportive care   Recent stroke:  acute and subacute small pathcy infarcts in the left MCA distribution, possibly artery to artery embolic due to left ICA stenosis.     Resultant  Right hand subtle weakness  MRI head Multiple small areas of acute infarct in the left parietal cortex and left occipital cortex. Numerous chronic white matter infarcts bilaterally  CTA head and neck - Large amount of noncalcified plaque at the proximal left internal carotid artery, causing 60-65% stenosis.   2D Echo  EF 60-65%  CUS - left ICA 40-59% stenosis  Cerebral angio < 50% left ICA stenosis  LDL 211  HgbA1c 9.5  SCDs for VTE prophylaxis Diet NPO time specified  aspirin 81 mg daily prior to admission, now on aspirin 325 mg daily and clopidogrel 75 mg daily. Recommend DAPT for 3 months and then plavix alone according to POINT study.   Patient counseled to be compliant with her antithrombotic medications  Ongoing aggressive stroke risk factor management  Therapy recommendations: None  Disposition:  Pending  Left ICA stenosis  CTA neck - left ICA 60-65%  CUS - left ICA 40-59%  DSA: left ICA < 50% stenosis  No intervention needed at this time  Continue follow-up as outpatient  Respiratory failure  Intubated  On ventilation  CCM on board  Hypertension  Stable Off clevidipine SBP < 160 mmHg Long-term BP goal normotensive  Hyperlipidemia  Home meds:  none  LDL 211, goal < 70  Patient not tolerating statins. self-reports leg numbness, chest pain, severe headaches with statins on multiple occassions with multiple providers  Recommend follow up with PCP Dr. Felipa Eth for consideration of PCSK9 inhibitor. Mediation name written to pt.  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 Jaynee Eagles at [facility] day # 5  This patient is critically ill due to code stroke status, acute mental status change, bilateral MCA thrombosis status post thrombectomy and at significant risk of neurological worsening, death form recurrent stroke, hemorrhagic transformation, seizure, brain edema, cerebral herniation. This patient's care requires constant monitoring of vital signs, hemodynamics, respiratory and cardiac monitoring, review of multiple databases, neurological assessment, discussion with family, other specialists and medical decision making of high complexity. I spent 40 minutes of neurocritical care time in the care of this patient. I had long discussion with patient and husband, daughter and sister at bedside, updated pt current condition, treatment plan and potential prognosis. They expressed understanding and appreciation.    Rosalin Hawking, MD PhD Stroke Neurology 06/05/2017 10:45 AM     To contact Stroke Continuity provider, please refer to [email or web address] After hours, contact General Neurology

## 2017-06-05 NOTE — Progress Notes (Signed)
PULMONARY / CRITICAL CARE MEDICINE   Name: [name] MRN: [id number] DOB: 06-14-1951    ADMISSION DATE:  05/31/2017 CONSULTATION DATE:  8/16  REFERRING MD:  Erlinda Hong   CHIEF COMPLAINT:  Vent management   HISTORY OF PRESENT ILLNESS:   66yo female with hx HTN, DM who presented initially 8/13 to her PCP with headache, R hight weakness and vision changes.  She was sent for MRI which revealed multiple areas of acute L MCA infarct.  She was sent to ER and admitted for CVA workup. On 8/16 pt was in cath lab for diagnostic carotid and cerebral angiogram with vascular surgery when she became suddenly unresponsive with L sided weakness and possible L sided seizure-like activity.  CTA revealed bilateral M1 occlusions.  She was intubated and taken urgently to IR for revascularization and rapid resolution of embolism.  PCCM consulted for ICU/vent management post procedure.   SUBJECTIVE:  Coretrack out last pm. Vomited x 1 yesterday . No BM since admit .  UOP improved , scr improved.  Unable to wean this am. -failed SBT  Increased alert, f/c this am . Moves left side, right sided weakness.   VITAL SIGNS: BP (!) 149/52   Pulse 69   Temp 98.6 F (37 C) (Oral)   Resp 16   Ht 5\' 4"  (1.626 m)   Wt 171 lb 11.8 oz (77.9 kg)   SpO2 100%   BMI 29.48 kg/m   HEMODYNAMICS:    VENTILATOR SETTINGS: Vent Mode: PRVC FiO2 (%):  [40 %] 40 % Set Rate:  [16 bmp] 16 bmp Vt Set:  [440 mL] 440 mL PEEP:  [5 cmH20] 5 cmH20 Pressure Support:  [12 cmH20] 12 cmH20 Plateau Pressure:  [15 cmH20-18 cmH20] 17 cmH20  INTAKE / OUTPUT: I/O last 3 completed shifts: In: 4860.1 [I.V.:2750.8; NG/GT:109.3; IV Piggyback:2000] Out: 2050 [Urine:1750; Emesis/NG output:300]  PHYSICAL EXAMINATION: General:  Ill appearing female intubated, nad  Neuro: off sedation , following commands, moves left side, weak right  HEENT:  Mm moist, ETT, normocephalic/ atraumatic Cardiovascular:  s1s2 rrr, No MRG Lungs:  resps even non  labored on vent, clear throughout, synchronous with vent  Abdomen:  Round, soft, +bs  Musculoskeletal:  Warm and dry, no sig edema Skin: Clean dry intact no rash or lesions  LABS:  BMET  Recent Labs Lab 06/03/17 0537 06/03/17 1352 06/04/17 0445 06/05/17 0131  NA 137  --  137 139  K 4.4  --  4.1 4.1  CL 103  --  109 111  CO2 23  --  21* 18*  BUN 23*  --  18 10  CREATININE 1.00 1.01* 1.06* 0.90  GLUCOSE 165*  --  112* 107*    Electrolytes  Recent Labs Lab 06/03/17 0537  06/03/17 1700 06/04/17 0445 06/04/17 1827 06/05/17 0131  CALCIUM 9.6  --   --  7.6*  --  7.5*  MG  --   < > 1.4* 1.5* 1.5*  --   PHOS  --   < > 4.5 3.6 2.9  --   < > = values in this interval not displayed.  CBC  Recent Labs Lab 05/31/17 1627 05/31/17 1647 06/03/17 1352 06/04/17 0445  WBC 11.1*  --  12.7* 14.7*  HGB 11.9* 12.9 9.8* 8.8*  HCT 36.6 38.0 30.5* 27.8*  PLT 346  --  322 276    Coag's  Recent Labs Lab 05/31/17 1627  APTT 25  INR 0.99    Sepsis Markers No results for  input(s): LATICACIDVEN, PROCALCITON, O2SATVEN in the last 168 hours.  ABG  Recent Labs Lab 06/03/17 1417  PHART 7.431  PCO2ART 31.0*  PO2ART 206*    Liver Enzymes  Recent Labs Lab 05/31/17 1627  AST 43*  ALT 26  ALKPHOS 159*  BILITOT 0.4  ALBUMIN 3.8    Cardiac Enzymes No results for input(s): TROPONINI, PROBNP in the last 168 hours.  Glucose  Recent Labs Lab 06/04/17 1644 06/04/17 2018 06/05/17 0004 06/05/17 0102 06/05/17 0437 06/05/17 0720  GLUCAP 97 85 64* 116* 85 84    Imaging Dg Chest Port 1 View  Result Date: 06/05/2017 CLINICAL DATA:  Followup for respiratory failure. EXAM: PORTABLE CHEST 1 VIEW COMPARISON:  06/03/2017 FINDINGS: The endotracheal tube and nasogastric tube are well positioned. There is opacity at the left lung base obscuring the left hemidiaphragm. This is stable from most recent prior exam. May reflect pneumonia or atelectasis. Remainder of the lungs is  clear. No pneumothorax. Cardiac silhouette is normal in size. No mediastinal or hilar masses. IMPRESSION: 1. Lungs appear stable from the most recent prior study. There is persistent opacity in the left lung base which may reflect pneumonia or atelectasis. No new lung abnormalities. 2. Support apparatus is well positioned. Electronically Signed   By: [name] M.D.   On: 06/05/2017 08:27   Dg Abd Portable 1v  Result Date: 06/04/2017 CLINICAL DATA:  66 year old female status post feeding tube placement. EXAM: PORTABLE ABDOMEN - 1 VIEW COMPARISON:  Abdominal radiograph 05/25/2017. FINDINGS: Nasogastric tube tip terminating in the distal body of the stomach. Feeding tube tip projects in the expected location of the antral pre-pyloric region of the stomach or in the duodenal bulb. Visualized bowel gas pattern is unremarkable. Contour abnormality in the periphery of the left lung base favored to reflect the presence of a small pleural effusion. IMPRESSION: 1. Support apparatus, as above. 2. Probable small left pleural effusion. Electronically Signed   By: Vinnie Langton M.D.   On: 06/04/2017 12:28   Dg Abd Portable 1v  Result Date: 06/04/2017 CLINICAL DATA:  OG tube placement. EXAM: PORTABLE ABDOMEN - 1 VIEW COMPARISON:  Scout image for CT scan dated 02/26/2016 FINDINGS: OG tube tip is in the body of the stomach. Bowel gas pattern is normal. No acute bone abnormality. IMPRESSION: OG tube tip in the body of the stomach. Electronically Signed   By: Lorriane Shire M.D.   On: 06/04/2017 10:04     STUDIES:  MR brain 8/12>>> Multiple small areas of acute infarct in the left parietal cortex and left occipital cortex. Recommend CTA head and neck to evaluate for source of emboli or stenosis causing these infarcts. Numerous chronic white matter infarcts bilaterally CTA head 8/16>>> 1. Right M1 embolus with subtotal occlusion but severe stenosis. The anterior temporal branch is covered and non-opacified. 2.  Elongated embolus within the left M1 and proximal M2 vessels. 3. Negative for dissection or interval ulceration in the arch and cervical carotids. 4. Carotid bifurcation atherosclerosis with approximately 50-60% left ICA bulb narrowing. Visually the stenosis appears worse due to positive remodeling at the low-density plaque. 5. Moderate atheromatous narrowing of the right V4 and mid basilar. CT head 8/16>>> 1. Several small foci of enhancement within bilateral parietal and left frontal cortex as well as left basal ganglia probably representing enhancing infarctions. 2. No large hemorrhage, focal mass effect, herniation, or Hydrocephalus. 2D echo 8/14>>>  - Left ventricle: The cavity size was normal. Systolic function was normal. The estimated ejection  fraction was in the range of 60% to 65%. Wall motion was normal; there were no regional wall motion abnormalities. Doppler parameters are consistent with abnormal left ventricular relaxation (grade 1 diastolic dysfunction). MRI:  06/04/2017>>> Numerous acute new supra- and infratentorial infarcts spanning multiple vascular territories consistent with embolic phenomena. Nohemorrhagic conversion.Moderate chronic small vessel ischemic disease.  CULTURES: None  ANTIBIOTICS: None  SIGNIFICANT EVENTS: 8/16 - sudden change in mental status, to IR for bilateral embolectomy for revascularization of bilat MCA occlusions   LINES/TUBES: ETT 8/16>>> R rad aline 8/16>>>8/17  DISCUSSION: 66yo female admitted with acute L MCA stroke, then acute R MCA occlusion during diagnostic arteriogram with AMS requiring intubation.  Taken to IR for revascularization.   ASSESSMENT / PLAN:  PULMONARY Acute respiratory failure - r/t AMS  CXR Basilar atx vs developing infection .  P:   Vent support - 8cc/kg  Assess for daily wean/SBT  F/u CXR  Titrate oxygen to maintain saturations> 94%    CARDIOVASCULAR Hx HTN  Off Cleviprex 8/17  P:  Cont Metoprolol   Prn lobetalol Goal SBP 120-140   RENAL UOP/Scr improving  P:   Trend BMET/ Urine output Strict I&O Avoid nephrotoxic medications  GASTROINTESTINAL Vomited 8/17 , Coretrack out 8/17  P:   TF on hold -consider restart 8/19  Add Stool softner  PPI   HEMATOLOGIC Bilateral MCA occlusion  L ICA stenosis  P:  lovenox  plavix Monitor for bleeding Trend CBC daily   INFECTIOUS Leukocytosis>> ? reactive ? Infiltrate per CXR 8/17 Afebrile P:   Monitor wbc, fever curve off abx  Low threshold for ABX if becomes febrile Consider pan culture if appears clinically infectious  ENDOCRINE DM - Hgb A1c >9 P:   Lantus, SSI CBG Q 4    NEUROLOGIC Bilateral acute MCA occlusion- s/p IR revascularization  Left ICA stenosis P:   RASS goal: -1 to 0  Per stroke team  WUA in am  Plavix, asa  Minimize sedation as able   FAMILY  - Updates: Family updated 8/18  - Inter-disciplinary family meet or Palliative Care meeting due by:  Day 7     Tammy Parrett NP -C  06/05/2017  8:33 AM Pager:  [phone]   STAFF NOTE: Linwood Dibbles, MD FACP have personally reviewed patient's available data, including medical history, events of note, physical examination and test results as part of my evaluation. I have discussed with resident/NP and other care providers such as pharmacist, RN and RRT. In addition, I personally evaluated patient and elicited key findings of: rass -1, fc on left, eyes closed will open to voice, moves left side , min rt, perrl, lunch clear , pcxr which I reviewed showed no well defined infiltrates possible retrocardiac but not impressed, MRI I reviewed shows bilateral embolic cvas's with sig  Neuro progress last 24 hours, on plavix, wean this am PS failed with low rr, dc fent, re assess in pm other attempts if able, re assess LFT on pcxr, feeding to goal only if we add reglan ( h/o gastroparesis and assess qtc), would trickle with this approach, but she has rash to  reglan will clarify this, would prefer post pyloric, may need IR attempts, I updated family in room , sys goals per neuro  The patient is critically ill with multiple organ systems failure and requires high complexity decision making for assessment and support, frequent evaluation and titration of therapies, application of advanced monitoring technologies and extensive interpretation of multiple databases.  Critical Care Time devoted to patient care services described in this note is 30 Minutes. This time reflects time of care of this signee: Merrie Roof, MD FACP. This critical care time does not reflect procedure time, or teaching time or supervisory time of PA/NP/Med student/Med Resident etc but could involve care discussion time. Rest per NP/medical resident whose note is outlined above and that I agree with   Lavon Paganini. Titus Mould, MD, [place] Pgr: [facility] 06/05/2017 10:58 AM

## 2017-06-05 NOTE — Progress Notes (Signed)
PT Cancellation Note  Patient Details Name: [name] MRN: [id number] DOB: 04/11/1951   Cancelled Treatment:    Reason Eval/Treat Not Completed: Other (comment) Pt still intubated and getting restless/agitated as PT on the phone with RN; about to give the pt some sedation. Will follow up.   Marguarite Arbour A Tayo Maute 06/05/2017, 11:18 AM Wray Kearns, PT, DPT [phone]

## 2017-06-05 NOTE — Progress Notes (Signed)
SLP Cancellation Note  Patient Details Name: [name] MRN: [id number] DOB: 12-Sep-1951   Cancelled treatment:       Reason Eval/Treat Not Completed: Medical issues which prohibited therapy. Pt remains on ventilator. Will f/u for cognitive linguistic evaluation.  Deneise Lever, [place], [place] Speech-Language Pathologist [place] 06/05/2017, 8:17 AM

## 2017-06-06 ENCOUNTER — Inpatient Hospital Stay (HOSPITAL_COMMUNITY): Payer: Medicare Other

## 2017-06-06 LAB — GLUCOSE, CAPILLARY
GLUCOSE-CAPILLARY: 148 mg/dL — AB (ref 65–99)
GLUCOSE-CAPILLARY: 151 mg/dL — AB (ref 65–99)
GLUCOSE-CAPILLARY: 155 mg/dL — AB (ref 65–99)
GLUCOSE-CAPILLARY: 90 mg/dL (ref 65–99)
Glucose-Capillary: 58 mg/dL — ABNORMAL LOW (ref 65–99)
Glucose-Capillary: 65 mg/dL (ref 65–99)
Glucose-Capillary: 122 mg/dL — ABNORMAL HIGH (ref 65–99)
Glucose-Capillary: 120 mg/dL — ABNORMAL HIGH (ref 65–99)

## 2017-06-06 LAB — CBC
HCT: 28.5 % — ABNORMAL LOW (ref 36.0–46.0)
Hemoglobin: 8.9 g/dL — ABNORMAL LOW (ref 12.0–15.0)
MCH: 27.6 pg (ref 26.0–34.0)
MCHC: 31.2 g/dL (ref 30.0–36.0)
MCV: 88.5 fL (ref 78.0–100.0)
PLATELETS: 258 10*3/uL (ref 150–400)
RBC: 3.22 MIL/uL — ABNORMAL LOW (ref 3.87–5.11)
RDW: 14.8 % (ref 11.5–15.5)
WBC: 10.3 10*3/uL (ref 4.0–10.5)

## 2017-06-06 LAB — PHOSPHORUS: Phosphorus: 2.8 mg/dL (ref 2.5–4.6)

## 2017-06-06 LAB — BASIC METABOLIC PANEL
Anion gap: 8 (ref 5–15)
BUN: 9 mg/dL (ref 6–20)
CALCIUM: 7.8 mg/dL — AB (ref 8.9–10.3)
CO2: 22 mmol/L (ref 22–32)
CREATININE: 0.89 mg/dL (ref 0.44–1.00)
Chloride: 108 mmol/L (ref 101–111)
GFR calc Af Amer: >60 mL/min (ref >60)
GLUCOSE: 101 mg/dL — AB (ref 65–99)
Potassium: 3.6 mmol/L (ref 3.5–5.1)
Sodium: 138 mmol/L (ref 135–145)

## 2017-06-06 LAB — MAGNESIUM: Magnesium: 1.6 mg/dL — ABNORMAL LOW (ref 1.7–2.4)

## 2017-06-06 MED ORDER — VITAL HIGH PROTEIN PO LIQD
1000.0000 mL | ORAL | Status: DC
  Administered 2017-06-06: 1000 mL
  Administered 2017-06-06: 18:00:00
  Administered 2017-06-07: 1000 mL
  Filled 2017-06-06: qty 1000

## 2017-06-06 MED ORDER — MAGNESIUM SULFATE 2 GM/50ML IV SOLN
2.0000 g | Freq: Once | INTRAVENOUS | Status: AC
  Administered 2017-06-06: 2 g via INTRAVENOUS
  Filled 2017-06-06: qty 50

## 2017-06-06 MED ORDER — STUDY - INVESTIGATIONAL MEDICATION
20.0000 mg | Freq: Three times a day (TID) | Status: DC
  Administered 2017-06-06 – 2017-06-13 (×22): 20 mg
  Filled 2017-06-06 (×23): qty 1

## 2017-06-06 MED ORDER — PRESCRIPTION MEDICATION: 20.0000 mg | Freq: Three times a day (TID) | Status: DC

## 2017-06-06 NOTE — Progress Notes (Signed)
SLP Cancellation Note  Patient Details Name: [name] MRN: [id number] DOB: [birth year]   Cancelled treatment:       Reason Eval/Treat Not Completed: Medical issues which prohibited therapy. Per charting, pt remains on ventilator. Will f/u when medically ready.  Deneise Lever, [place], [place] Speech-Language Pathologist [phone]   Aliene Altes 06/06/2017, 8:20 AM

## 2017-06-06 NOTE — Plan of Care (Signed)
Problem: Health Behavior/Discharge Planning: Goal: Ability to manage health-related needs will improve Outcome: Progressing RN has spoken with the family and discussed the need to reinforce taking medications as prescribed and following the directions of the physician. Family is aware and will be supportive in helping the patient to make better choices and will help her to follow up with the physicians as scheduled.

## 2017-06-06 NOTE — Progress Notes (Signed)
This note also relates to the following rows which could not be included: SpO2 - Cannot attach notes to unvalidated device data  RT placed pt back on full vent support due to low RR and MVe.  RN notified.

## 2017-06-06 NOTE — Progress Notes (Signed)
STROKE TEAM PROGRESS NOTE   SUBJECTIVE (INTERVAL HISTORY) Daughter and sister are at bedside. Pt still intubated, again failed weaning trial today. Otherwise, pt lethargic but able to open eyes on voice and follows simple central and peripheral commands.   OBJECTIVE Temp:  [98.3 F (36.8 C)-98.9 F (37.2 C)] 98.6 F (37 C) (08/19 0734) Pulse Rate:  [55-114] 60 (08/19 0700) Cardiac Rhythm: Normal sinus rhythm (08/19 0737) Resp:  [10-19] 16 (08/19 0700) BP: (108-186)/(46-79) 148/60 (08/19 0700) SpO2:  [99 %-100 %] 100 % (08/19 0700) FiO2 (%):  [40 %] 40 % (08/19 0400) Weight:  [177 lb 4 oz (80.4 kg)] 177 lb 4 oz (80.4 kg) (08/19 0500)  CBC:   Recent Labs Lab 05/31/17 1627  06/04/17 0445 06/06/17 0236  WBC 11.1*  < > 14.7* 10.3  NEUTROABS 8.3*  --  12.7*  --   HGB 11.9*  < > 8.8* 8.9*  HCT 36.6  < > 27.8* 28.5*  MCV 85.1  < > 85.5 88.5  PLT 346  < > 276 258  < > = values in this interval not displayed.  Basic Metabolic Panel:   Recent Labs Lab 06/04/17 1827 06/05/17 0131 06/06/17 0236  NA  --  139 138  K  --  4.1 3.6  CL  --  111 108  CO2  --  18* 22  GLUCOSE  --  107* 101*  BUN  --  10 9  CREATININE  --  0.90 0.89  CALCIUM  --  7.5* 7.8*  MG 1.5*  --  1.6*  PHOS 2.9  --  2.8    Lipid Panel:     Component Value Date/Time   CHOL 297 (H) 06/02/2017 0741   TRIG 184 (H) 06/02/2017 0741   HDL 49 06/02/2017 0741   CHOLHDL 6.1 06/02/2017 0741   VLDL 37 06/02/2017 0741   LDLCALC 211 (H) 06/02/2017 0741   HgbA1c:  Lab Results  Component Value Date   HGBA1C 9.5 (H) 06/02/2017   Urine Drug Screen: No results found for: LABOPIA, COCAINSCRNUR, LABBENZ, AMPHETMU, THCU, LABBARB  Alcohol Level No results found for: [place] I have personally reviewed the radiological images below and agree with the radiology interpretations.  Ct Angio Head and neck W Or Wo Contrast 05/31/2017 IMPRESSION: 1. No emergent large vessel occlusion. 2. Large amount of noncalcified  plaque at the proximal left internal carotid artery, causing 60-65% stenosis. This is the most likely source of emboli for the previously identified left parietal and occipital infarcts. 3. No other intracranial or cervical arterial stenosis. 4. Minimal Aortic Atherosclerosis (ICD10-I70.0).   Mr Jeri Cos Wo Contrast 05/30/2017 IMPRESSION: Multiple small areas of acute infarct in the left parietal cortex and left occipital cortex. Recommend CTA head and neck to evaluate for source of emboli or stenosis causing these infarcts Numerous chronic white matter infarcts bilaterally   CUS 06/01/2017 1-39% right internal carotid artery stenosis and 40-59% left internal carotid artery stenosis. Vertebral arteries are patent with antegrade flow.  TTE 8/114/2018  - Left ventricle: The cavity size was normal. Systolic function was normal. The estimated ejection fraction was in the range of 60% to 65%. Wall motion was normal; there were no regional wall motion abnormalities. Doppler parameters are consistent with abnormal left ventricular relaxation (grade 1 diastolic dysfunction).  Carotid Angiography 06/03/2017 FINDING(S):  Type I aortic arch: widely patent  Innominate artery: widely patent ? R common carotid artery: widely patent ? R internal carotid artery: minimal disease,  diffuse atherosclerotic plaque suggested ? R external carotid artery: minimal disease, diffuse atherosclerotic plaque suggested ? R subclavian artery: patent ? R vertebral artery: patent proximally  Left common carotid artery: patent ? L internal carotid artery: patent, 43% stenosis by NASCET proximally ? L external carotid artery: patent  Left subclavian artery: patent ? Left vertebral artery: patent  Intracranial findings to be read by Neuroradiology  CT Head Code Stroke  06/03/2017 IMPRESSION: 1. No acute finding or change from 3 days ago. 2. Known recent small cortical infarcts in the left cerebrum are not clearly  visualized. Chronic small vessel ischemic changes elsewhere. are stable  CTA Head/Neck 06/03/2017 IMPRESSION: 1. Right M1 embolus with subtotal occlusion but severe stenosis. The anterior temporal branch is covered and non-opacified.  2. Elongated embolus within the left M1 and proximal M2 vessels. 3. Negative for dissection or interval ulceration in the arch and cervical carotids. 4. Carotid bifurcation atherosclerosis with approximately 50-60% left ICA bulb narrowing. Visually the stenosis appears worse due to positive remodeling at the low-density plaque. 5. Moderate atheromatous narrowing of the right V4 and mid basilar.  CT Head WO Contrast 06/03/2017 IMPRESSION: 1. Several small foci of enhancement within bilateral parietal and left frontal cortex as well as left basal ganglia probably representing enhancing infarctions. 2. No large hemorrhage, focal mass effect, herniation, or hydrocephalus.  DG Chest Port 1 View 06/03/2017 IMPRESSION: 1. The ETT terminates 1.5 cm above the carina. Recommend withdrawing 1 cm. 2. New infiltrate in the lateral left lung base is nonspecific.  Recommend attention on follow-up.  DSA 06/03/2017 S/P bilateral common carotid arteriogram,and Rt vert angiogram,followed by complete revascularization of occluded  MCAs M1 segments bilaterally, with x 1 pass with embotrap 32mmx 35 mm  Retrieval device achieving a TICI3 reperfusion bilaterally.  EEG 06/03/2017 This EEG is abnormal and findings are suggestive of mild generalized cerebral dysfunction. Intermittent arm twitching was not associated with any epileptiform activity.  MRI Brain Wo Contrast 06/04/2017 IMPRESSION: 1. 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 No hemorrhagic conversion. 2. Moderate chronic small vessel ischemic disease.    PHYSICAL EXAM  Temp:  [98.3 F (36.8 C)-98.9 F (37.2 C)] 98.6 F (37 C)  (08/19 0734) Pulse Rate:  [55-114] 60 (08/19 0700) Resp:  [10-19] 16 (08/19 0700) BP: (108-186)/(46-79) 148/60 (08/19 0700) SpO2:  [99 %-100 %] 100 % (08/19 0700) FiO2 (%):  [40 %] 40 % (08/19 0400) Weight:  [177 lb 4 oz (80.4 kg)] 177 lb 4 oz (80.4 kg) (08/19 0500)  General - obese, well developed, intubated on fentanyl.  Ophthalmologic - Fundi not visualized.  Cardiovascular - Regular rate and rhythm.  Neuro - intubated on fentanyl. Eyes open on voice and following simple peripheral and central commands. Eyes moving both direction on commands, but not complete. PERRL, blinking to visual threat bilaterally and tracking. Facial symmetry not able to test due to ET tube. BUE symmetrial against gravities. LLE 3/5 at least, RLE 3-/5. DTR 1+ and no babinski. Sensation, coordination and gait not tested.   ASSESSMENT/PLAN Ms. EKAM BONEBRAKE is a 66 y.o. female with history of HTN, HLD, pseudomotor cerebri, DM  presenting with headache and weakness of right hand. She did not receive IV t-PA due to out of window. CTA head and neck showed left ICA 65% stenosis. Dr. Bridgett Larsson consulted. During cerebral angiogram this morning, patient had acute mental status decline, became unresponsive. Code stroke called.  Found to have M1 thrombus,  status post mechanical thrombectomy. MRI 06/04/2017 with numerous acute new supra- and infratentorial infarcts spanning multiple vascular territories consistent with embolic phenomena.   Stroke - bilateral MCA acute thrombosis, likely related to cerebral angiogram procedure  Acute mental status decline during procedure  CT negative for acute hemorrhage or infarct  CTA head and neck showed bilateral MCA acute thrombosis  S/p mechanical thrombectomy with bilateral TICI3 reperfusion  EEG with no seizure activity  MRI showed bilateral supra-and infratentorial infarcts, consistent with cardioembolic phenomena  Continue DAPT and supportive care   Clinically much  improved  Wean off vent as able  Recent stroke: acute and subacute small pathcy infarcts in the left MCA distribution, possibly artery to artery embolic due to left ICA stenosis.     Resultant  Right hand subtle weakness  MRI head Multiple small areas of acute infarct in the left parietal cortex and left occipital cortex. Numerous chronic white matter infarcts bilaterally  CTA head and neck - Large amount of noncalcified plaque at the proximal left internal carotid artery, causing 60-65% stenosis.   2D Echo  EF 60-65%  CUS - left ICA 40-59% stenosis  Cerebral angio < 50% left ICA stenosis  LDL 211  HgbA1c 9.5  SCDs for VTE prophylaxis Diet NPO time specified  aspirin 81 mg daily prior to admission, now on aspirin 325 mg daily and clopidogrel 75 mg daily. Recommend DAPT for 3 months and then plavix alone according to POINT study.   Patient counseled to be compliant with her antithrombotic medications  Ongoing aggressive stroke risk factor management  Therapy recommendations: None  Disposition:  Pending  Left ICA stenosis  CTA neck - left ICA 60-65%  CUS - left ICA 40-59%  DSA: left ICA < 50% stenosis  No intervention needed at this time  Continue follow-up as outpatient  Respiratory failure  Intubated  On ventilation  Failed weaning trial today  CCM on board  Hypertension  Stable Off clevidipine SBP < 160 mmHg Long-term BP goal normotensive  Hyperlipidemia  Home meds:  none  LDL 211, goal < 70  Patient not tolerating statins. self-reports leg numbness, chest pain, severe headaches with statins on multiple occassions with multiple providers  Recommend follow up with PCP Dr. Felipa Eth for consideration of PCSK9 inhibitor. Mediation name written to pt.  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 Jaynee Eagles at [facility] day # 6  This patient is critically ill due to code stroke status, acute mental status change, bilateral MCA thrombosis status post thrombectomy and at significant risk of neurological worsening, death form recurrent stroke, hemorrhagic transformation, seizure, brain edema, cerebral herniation. This patient's care requires constant monitoring of vital signs, hemodynamics, respiratory and cardiac monitoring, review of multiple databases, neurological assessment, discussion with family, other specialists and medical decision making of high complexity. I spent 35 minutes of neurocritical care time in the care of this patient. I had long discussion with patient and husband, daughter and sister at bedside, updated pt current condition, treatment plan and potential prognosis. They expressed understanding and appreciation.   Rosalin Hawking, MD PhD Stroke Neurology 06/06/2017 6:09 PM   To contact Stroke Continuity provider, please  refer to [email or web address] After hours, contact General Neurology

## 2017-06-06 NOTE — Progress Notes (Signed)
PULMONARY / CRITICAL CARE MEDICINE   Name: [name] MRN: [id number] DOB: 11-14-1950    ADMISSION DATE:  05/31/2017 CONSULTATION DATE:  8/16  REFERRING MD:  Erlinda Hong   CHIEF COMPLAINT:  Vent management   HISTORY OF PRESENT ILLNESS:   66yo female with hx HTN, DM who presented initially 8/13 to her PCP with headache, R hight weakness and vision changes.  She was sent for MRI which revealed multiple areas of acute L MCA infarct.  She was sent to ER and admitted for CVA workup. On 8/16 pt was in cath lab for diagnostic carotid and cerebral angiogram with vascular surgery when she became suddenly unresponsive with L sided weakness and possible L sided seizure-like activity.  CTA revealed bilateral M1 occlusions.  She was intubated and taken urgently to IR for revascularization and rapid resolution of embolism.  PCCM consulted for ICU/vent management post procedure.   SUBJECTIVE:  No further vomiting , NGT to LIWS .  Failed SBT this am, weak, low vol.  Mentation improving , moving left side , right sided weakness.    VITAL SIGNS: BP (!) 134/58   Pulse 61   Temp 98.6 F (37 C) (Axillary)   Resp 16   Ht 5\' 4"  (1.626 m)   Wt 177 lb 4 oz (80.4 kg)   SpO2 100%   BMI 30.42 kg/m   HEMODYNAMICS:    VENTILATOR SETTINGS: Vent Mode: PRVC FiO2 (%):  [40 %] 40 % Set Rate:  [16 bmp] 16 bmp Vt Set:  [440 mL] 440 mL PEEP:  [5 cmH20] 5 cmH20 Plateau Pressure:  [14 cmH20-22 cmH20] 14 cmH20  INTAKE / OUTPUT: I/O last 3 completed shifts: In: 1417.2 [I.V.:1267.2; IV Piggyback:150] Out: 1210 [Urine:910; Emesis/NG output:300]  PHYSICAL EXAMINATION: General:  Ill appearing female intubated, nad  Neuro: off sedation , following commands, moves left side, weak right  HEENT:  Mm moist, ETT, normocephalic/ atraumatic Cardiovascular:  s1s2 rrr, No MRG Lungs:  resps even non labored on vent, clear throughout, synchronous with vent  Abdomen:  Round, soft, +bs  Musculoskeletal:  Warm and dry, no  sig edema Skin: Clean dry intact no rash or lesions  LABS:  BMET  Recent Labs Lab 06/04/17 0445 06/05/17 0131 06/06/17 0236  NA 137 139 138  K 4.1 4.1 3.6  CL 109 111 108  CO2 21* 18* 22  BUN 18 10 9   CREATININE 1.06* 0.90 0.89  GLUCOSE 112* 107* 101*    Electrolytes  Recent Labs Lab 06/04/17 0445 06/04/17 1827 06/05/17 0131 06/06/17 0236  CALCIUM 7.6*  --  7.5* 7.8*  MG 1.5* 1.5*  --  1.6*  PHOS 3.6 2.9  --  2.8    CBC  Recent Labs Lab 06/03/17 1352 06/04/17 0445 06/06/17 0236  WBC 12.7* 14.7* 10.3  HGB 9.8* 8.8* 8.9*  HCT 30.5* 27.8* 28.5*  PLT 322 276 258    Coag's  Recent Labs Lab 05/31/17 1627  APTT 25  INR 0.99    Sepsis Markers No results for input(s): LATICACIDVEN, PROCALCITON, O2SATVEN in the last 168 hours.  ABG  Recent Labs Lab 06/03/17 1417  PHART 7.431  PCO2ART 31.0*  PO2ART 206*    Liver Enzymes  Recent Labs Lab 05/31/17 1627  AST 43*  ALT 26  ALKPHOS 159*  BILITOT 0.4  ALBUMIN 3.8    Cardiac Enzymes No results for input(s): TROPONINI, PROBNP in the last 168 hours.  Glucose  Recent Labs Lab 06/05/17 1549 06/05/17 2051 06/06/17 0021 06/06/17  0427 06/06/17 0513 06/06/17 0740  GLUCAP 130* 124* 151* 58* 65 90    Imaging Dg Chest Port 1 View  Result Date: 06/06/2017 CLINICAL DATA:  Respiratory failure EXAM: PORTABLE CHEST 1 VIEW COMPARISON:  Yesterday FINDINGS: Endotracheal tube tip 2 cm above the carina. An orogastric tube tip or side port is at the stomach. There is no edema, consolidation, or pneumothorax. Trace pleural fluid or atelectasis at the peripheral left base. Cardiomegaly. IMPRESSION: 1. Stable positioning of endotracheal and orogastric tubes. 2. Mild atelectasis or trace pleural fluid at the left base. Electronically Signed   By: Monte Fantasia M.D.   On: 06/06/2017 07:33     STUDIES:  MR brain 8/12>>> Multiple small areas of acute infarct in the left parietal cortex and left occipital  cortex. Recommend CTA head and neck to evaluate for source of emboli or stenosis causing these infarcts. Numerous chronic white matter infarcts bilaterally CTA head 8/16>>> 1. Right M1 embolus with subtotal occlusion but severe stenosis. The anterior temporal branch is covered and non-opacified. 2. Elongated embolus within the left M1 and proximal M2 vessels. 3. Negative for dissection or interval ulceration in the arch and cervical carotids. 4. Carotid bifurcation atherosclerosis with approximately 50-60% left ICA bulb narrowing. Visually the stenosis appears worse due to positive remodeling at the low-density plaque. 5. Moderate atheromatous narrowing of the right V4 and mid basilar. CT head 8/16>>> 1. Several small foci of enhancement within bilateral parietal and left frontal cortex as well as left basal ganglia probably representing enhancing infarctions. 2. No large hemorrhage, focal mass effect, herniation, or Hydrocephalus. 2D echo 8/14>>>  - Left ventricle: The cavity size was normal. Systolic function was normal. The estimated ejection fraction was in the range of 60% to 65%. Wall motion was normal; there were no regional wall motion abnormalities. Doppler parameters are consistent with abnormal left ventricular relaxation (grade 1 diastolic dysfunction). MRI:  06/04/2017>>> Numerous acute new supra- and infratentorial infarcts spanning multiple vascular territories consistent with embolic phenomena. Nohemorrhagic conversion.Moderate chronic small vessel ischemic disease.  CULTURES: None  ANTIBIOTICS: None  SIGNIFICANT EVENTS: 8/16 - sudden change in mental status, to IR for bilateral embolectomy for revascularization of bilat MCA occlusions   LINES/TUBES: ETT 8/16>>> R rad aline 8/16>>>8/17  DISCUSSION: 66yo female admitted with acute L MCA stroke, then acute R MCA occlusion during diagnostic arteriogram with AMS requiring intubation.  Taken to IR for revascularization.    ASSESSMENT / PLAN:  PULMONARY Acute respiratory failure - r/t AMS  CXR Basilar atx vs developing infection .  P:   Vent support - 8cc/kg  Assess for daily wean/SBT  F/u CXR  Titrate oxygen to maintain saturations> 94%    CARDIOVASCULAR Hx HTN  Off Cleviprex 8/17  P:  Cont Metoprolol  Prn lobetalol Goal SBP 120-140   RENAL  Hypomagnesium  P:   Trend BMET/ Urine output Strict I&O Avoid nephrotoxic medications Replace Mg+   GASTROINTESTINAL Vomited 8/17 , Coretrack out 8/17  ABD film neg 8/17  Gastroparesis  P:   TF on hold   Stool softner 8/18  Pepcid   HEMATOLOGIC Bilateral MCA occlusion  L ICA stenosis  P:  lovenox  plavix Monitor for bleeding Trend CBC daily   INFECTIOUS Leukocytosis>> ? Reactive>resolved  Afebrile P:   Monitor wbc, fever curve off abx  Low threshold for ABX if becomes febrile Consider pan culture if appears clinically infectious  ENDOCRINE DM - Hgb A1c >9 Hypoglycemia  P:   SSI Lantus on  hold w/ TF off  CBG Q 4  Increase D5NS 50    NEUROLOGIC Bilateral acute MCA occlusion- s/p IR revascularization  Left ICA stenosis P:   RASS goal: -1 to 0  Per stroke team  WUA in am  Plavix, asa  Minimize sedation as able   FAMILY  - Updates: Family updated 8/18  - Inter-disciplinary family meet or Palliative Care meeting due by:  Day 7     Morgan Parrett NP -C  06/06/2017  10:09 AM Pager:  [phone]  STAFF NOTE: Linwood Dibbles, MD FACP have personally reviewed patient's available data, including medical history, events of note, physical examination and test results as part of my evaluation. I have discussed with resident/NP and other care providers such as pharmacist, RN and RRT. In addition, I personally evaluated patient and elicited key findings of: awake, follows commands, lungs clear, abdo soft, stronger for sure on rt lower ext, maintain good strength in left lower ext, pcxr which I reviewed shows no pulm  edema, ett wnl, weaned this am with apnea, likely contributed from from fent and bilateral strokes, we should reduce fent and repeat PS weaning, even balance goals, she had vomited with high rate TF, we cannot get IR to place post pyloric today will  Need this in am, in meantime should start trickel TF at 5-10 and NOT increase, I have evaluated her home med domperidone and if qtc okay and pharmacy approves use we can consider using this if able to crush, feeling very optimistic about her progress , I updated her daughter The patient is critically ill with multiple organ systems failure and requires high complexity decision making for assessment and support, frequent evaluation and titration of therapies, application of advanced monitoring technologies and extensive interpretation of multiple databases.   Critical Care Time devoted to patient care services described in this note is 30 Minutes. This time reflects time of care of this signee: Merrie Roof, MD FACP. This critical care time does not reflect procedure time, or teaching time or supervisory time of PA/NP/Med student/Med Resident etc but could involve care discussion time. Rest per NP/medical resident whose note is outlined above and that I agree with   Lavon Paganini. Titus Mould, MD, [place] Pgr: [facility] 06/06/2017 12:06 PM

## 2017-06-07 ENCOUNTER — Encounter (HOSPITAL_COMMUNITY): Payer: Self-pay | Admitting: Interventional Radiology

## 2017-06-07 ENCOUNTER — Inpatient Hospital Stay (HOSPITAL_COMMUNITY): Payer: Medicare Other

## 2017-06-07 DIAGNOSIS — J9601 Acute respiratory failure with hypoxia: Secondary | ICD-10-CM

## 2017-06-07 DIAGNOSIS — I10 Essential (primary) hypertension: Secondary | ICD-10-CM

## 2017-06-07 LAB — BASIC METABOLIC PANEL
Anion gap: 7 (ref 5–15)
BUN: 7 mg/dL (ref 6–20)
CHLORIDE: 109 mmol/L (ref 101–111)
CO2: 24 mmol/L (ref 22–32)
CREATININE: 0.83 mg/dL (ref 0.44–1.00)
Calcium: 7.9 mg/dL — ABNORMAL LOW (ref 8.9–10.3)
GFR calc Af Amer: >60 mL/min (ref >60)
GFR calc non Af Amer: >60 mL/min (ref >60)
Glucose, Bld: 139 mg/dL — ABNORMAL HIGH (ref 65–99)
POTASSIUM: 3.6 mmol/L (ref 3.5–5.1)
Sodium: 140 mmol/L (ref 135–145)

## 2017-06-07 LAB — GLUCOSE, CAPILLARY
GLUCOSE-CAPILLARY: 156 mg/dL — AB (ref 65–99)
GLUCOSE-CAPILLARY: 137 mg/dL — AB (ref 65–99)
GLUCOSE-CAPILLARY: 279 mg/dL — AB (ref 65–99)
Glucose-Capillary: 123 mg/dL — ABNORMAL HIGH (ref 65–99)
Glucose-Capillary: 193 mg/dL — ABNORMAL HIGH (ref 65–99)

## 2017-06-07 LAB — POCT I-STAT 3, ART BLOOD GAS (G3+)
ACID-BASE EXCESS: 4 mmol/L — AB (ref 0.0–2.0)
BICARBONATE: 28.4 mmol/L — AB (ref 20.0–28.0)
O2 SAT: 100 %
PCO2 ART: 43.7 mmHg (ref 32.0–48.0)
PO2 ART: 393 mmHg — AB (ref 83.0–108.0)
Patient temperature: 98.7
TCO2: 30 mmol/L (ref 0–100)
pH, Arterial: 7.421 (ref 7.350–7.450)

## 2017-06-07 LAB — CBC
HEMATOCRIT: 27.6 % — AB (ref 36.0–46.0)
HEMOGLOBIN: 8.5 g/dL — AB (ref 12.0–15.0)
MCH: 26.8 pg (ref 26.0–34.0)
MCHC: 30.8 g/dL (ref 30.0–36.0)
MCV: 87.1 fL (ref 78.0–100.0)
Platelets: 300 10*3/uL (ref 150–400)
RBC: 3.17 MIL/uL — AB (ref 3.87–5.11)
RDW: 14.5 % (ref 11.5–15.5)
WBC: 9.6 10*3/uL (ref 4.0–10.5)

## 2017-06-07 LAB — MAGNESIUM: Magnesium: 2.3 mg/dL (ref 1.7–2.4)

## 2017-06-07 LAB — TRIGLYCERIDES: TRIGLYCERIDES: 224 mg/dL — AB (ref <150)

## 2017-06-07 MED ORDER — VITAL 1.5 CAL PO LIQD
1000.0000 mL | ORAL | Status: DC
  Filled 2017-06-07: qty 1000

## 2017-06-07 MED ORDER — LABETALOL HCL 5 MG/ML IV SOLN
10.0000 mg | INTRAVENOUS | Status: DC | PRN
  Administered 2017-06-07 (×5): 10 mg via INTRAVENOUS
  Filled 2017-06-07 (×3): qty 4

## 2017-06-07 MED ORDER — FENTANYL CITRATE (PF) 100 MCG/2ML IJ SOLN
50.0000 ug | INTRAMUSCULAR | Status: DC | PRN
  Administered 2017-06-07: 50 ug via INTRAVENOUS
  Filled 2017-06-07 (×3): qty 2

## 2017-06-07 MED ORDER — DEXAMETHASONE SODIUM PHOSPHATE 10 MG/ML IJ SOLN
4.0000 mg | Freq: Two times a day (BID) | INTRAMUSCULAR | Status: DC
  Administered 2017-06-07 – 2017-06-09 (×5): 4 mg via INTRAVENOUS
  Filled 2017-06-07 (×5): qty 1

## 2017-06-07 MED ORDER — ETOMIDATE 2 MG/ML IV SOLN
20.0000 mg | Freq: Once | INTRAVENOUS | Status: AC
  Administered 2017-06-07: 20 mg via INTRAVENOUS

## 2017-06-07 MED ORDER — MIDAZOLAM HCL 2 MG/2ML IJ SOLN
1.0000 mg | Freq: Once | INTRAMUSCULAR | Status: AC
  Administered 2017-06-07: 1 mg via INTRAVENOUS

## 2017-06-07 MED ORDER — MIDAZOLAM HCL 2 MG/2ML IJ SOLN
INTRAMUSCULAR | Status: AC
  Filled 2017-06-07: qty 2

## 2017-06-07 MED ORDER — FENTANYL CITRATE (PF) 100 MCG/2ML IJ SOLN
100.0000 ug | Freq: Once | INTRAMUSCULAR | Status: AC
  Administered 2017-06-07: 100 ug via INTRAVENOUS

## 2017-06-07 MED ORDER — FENTANYL CITRATE (PF) 100 MCG/2ML IJ SOLN
50.0000 ug | INTRAMUSCULAR | Status: DC | PRN
  Administered 2017-06-07 – 2017-06-17 (×12): 50 ug via INTRAVENOUS
  Filled 2017-06-07 (×10): qty 2

## 2017-06-07 MED ORDER — PRO-STAT SUGAR FREE PO LIQD
30.0000 mL | Freq: Four times a day (QID) | ORAL | Status: DC
  Administered 2017-06-07 – 2017-06-15 (×31): 30 mL
  Filled 2017-06-07 (×31): qty 30

## 2017-06-07 MED ORDER — PROPOFOL 1000 MG/100ML IV EMUL
0.0000 ug/kg/min | INTRAVENOUS | Status: DC
  Administered 2017-06-07: 15 ug/kg/min via INTRAVENOUS
  Administered 2017-06-08: 40 ug/kg/min via INTRAVENOUS
  Administered 2017-06-08: 15 ug/kg/min via INTRAVENOUS
  Administered 2017-06-08 (×2): 25 ug/kg/min via INTRAVENOUS
  Administered 2017-06-09: 40 ug/kg/min via INTRAVENOUS
  Administered 2017-06-10 – 2017-06-11 (×4): 20 ug/kg/min via INTRAVENOUS
  Filled 2017-06-07 (×10): qty 100

## 2017-06-07 MED ORDER — VITAL 1.5 CAL PO LIQD
1000.0000 mL | ORAL | Status: DC
  Administered 2017-06-07 – 2017-06-14 (×7): 1000 mL
  Filled 2017-06-07 (×11): qty 1000

## 2017-06-07 MED ORDER — ROCURONIUM BROMIDE 50 MG/5ML IV SOLN
60.0000 mg | Freq: Once | INTRAVENOUS | Status: AC
  Administered 2017-06-07: 60 mg via INTRAVENOUS

## 2017-06-07 MED ORDER — ROCURONIUM BROMIDE 50 MG/5ML IV SOLN
60.0000 mg | Freq: Once | INTRAVENOUS | Status: DC
  Filled 2017-06-07: qty 6

## 2017-06-07 MED ORDER — VITAL 1.5 CAL PO LIQD: 1000.0000 mL | ORAL | Status: DC

## 2017-06-07 MED ORDER — FENTANYL CITRATE (PF) 100 MCG/2ML IJ SOLN
INTRAMUSCULAR | Status: AC
  Filled 2017-06-07: qty 2

## 2017-06-07 MED ORDER — METHYLPREDNISOLONE SODIUM SUCC 125 MG IJ SOLR
60.0000 mg | Freq: Once | INTRAMUSCULAR | Status: AC
  Administered 2017-06-07: 60 mg via INTRAVENOUS
  Filled 2017-06-07: qty 2

## 2017-06-07 NOTE — Progress Notes (Signed)
STROKE TEAM PROGRESS NOTE   SUBJECTIVE (INTERVAL HISTORY) Her husband is  bedside. Pt still intubated but more alert and  able to open eyes on voice and follows simple central and peripheral commands. She has been weaning well since this morning on the ventilator  OBJECTIVE Temp:  [96.1 F (35.6 C)-98.9 F (37.2 C)] 98.7 F (37.1 C) (08/20 1200) Pulse Rate:  [66-99] 88 (08/20 1355) Cardiac Rhythm: Normal sinus rhythm (08/20 0800) Resp:  [7-20] 16 (08/20 1355) BP: (140-184)/(55-100) 173/87 (08/20 1355) SpO2:  [97 %-100 %] 100 % (08/20 1355) FiO2 (%):  [28 %-100 %] 100 % (08/20 1355) Weight:  [180 lb 5.4 oz (81.8 kg)] 180 lb 5.4 oz (81.8 kg) (08/20 0410)  CBC:   Recent Labs Lab 05/31/17 1627  06/04/17 0445 06/06/17 0236 06/07/17 0227  WBC 11.1*  < > 14.7* 10.3 9.6  NEUTROABS 8.3*  --  12.7*  --   --   HGB 11.9*  < > 8.8* 8.9* 8.5*  HCT 36.6  < > 27.8* 28.5* 27.6*  MCV 85.1  < > 85.5 88.5 87.1  PLT 346  < > 276 258 300  < > = values in this interval not displayed.  Basic Metabolic Panel:   Recent Labs Lab 06/04/17 1827  06/06/17 0236 06/07/17 0227  NA  --   < > 138 140  K  --   < > 3.6 3.6  CL  --   < > 108 109  CO2  --   < > 22 24  GLUCOSE  --   < > 101* 139*  BUN  --   < > 9 7  CREATININE  --   < > 0.89 0.83  CALCIUM  --   < > 7.8* 7.9*  MG 1.5*  --  1.6* 2.3  PHOS 2.9  --  2.8  --   < > = values in this interval not displayed.  Lipid Panel:     Component Value Date/Time   CHOL 297 (H) 06/02/2017 0741   TRIG 184 (H) 06/02/2017 0741   HDL 49 06/02/2017 0741   CHOLHDL 6.1 06/02/2017 0741   VLDL 37 06/02/2017 0741   LDLCALC 211 (H) 06/02/2017 0741   HgbA1c:  Lab Results  Component Value Date   HGBA1C 9.5 (H) 06/02/2017   Urine Drug Screen: No results found for: LABOPIA, COCAINSCRNUR, LABBENZ, AMPHETMU, THCU, LABBARB  Alcohol Level No results found for: [place] I have personally reviewed the radiological images below and agree with the radiology  interpretations.  Ct Angio Head and neck W Or Wo Contrast 05/31/2017 IMPRESSION: 1. No emergent large vessel occlusion. 2. Large amount of noncalcified plaque at the proximal left internal carotid artery, causing 60-65% stenosis. This is the most likely source of emboli for the previously identified left parietal and occipital infarcts. 3. No other intracranial or cervical arterial stenosis. 4. Minimal Aortic Atherosclerosis (ICD10-I70.0).   Mr Jeri Cos Wo Contrast 05/30/2017 IMPRESSION: Multiple small areas of acute infarct in the left parietal cortex and left occipital cortex. Recommend CTA head and neck to evaluate for source of emboli or stenosis causing these infarcts Numerous chronic white matter infarcts bilaterally   CUS 06/01/2017 1-39% right internal carotid artery stenosis and 40-59% left internal carotid artery stenosis. Vertebral arteries are patent with antegrade flow.  TTE 8/114/2018  - Left ventricle: The cavity size was normal. Systolic function was normal. The estimated ejection fraction was in the range of 60% to 65%. Wall motion  was normal; there were no regional wall motion abnormalities. Doppler parameters are consistent with abnormal left ventricular relaxation (grade 1 diastolic dysfunction).  Carotid Angiography 06/03/2017 FINDING(S):  Type I aortic arch: widely patent  Innominate artery: widely patent ? R common carotid artery: widely patent ? R internal carotid artery: minimal disease, diffuse atherosclerotic plaque suggested ? R external carotid artery: minimal disease, diffuse atherosclerotic plaque suggested ? R subclavian artery: patent ? R vertebral artery: patent proximally  Left common carotid artery: patent ? L internal carotid artery: patent, 43% stenosis by NASCET proximally ? L external carotid artery: patent  Left subclavian artery: patent ? Left vertebral artery: patent  Intracranial findings to be read by Neuroradiology  CT Head Code Stroke   06/03/2017 IMPRESSION: 1. No acute finding or change from 3 days ago. 2. 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 Chronic small vessel ischemic changes elsewhere. are stable  CTA Head/Neck 06/03/2017 IMPRESSION: 1. Right M1 embolus with subtotal occlusion but severe stenosis. The anterior temporal branch is covered and non-opacified.  2. Elongated embolus within the left M1 and proximal M2 vessels. 3. Negative for dissection or interval ulceration in the arch and cervical carotids. 4. Carotid bifurcation atherosclerosis with approximately 50-60% left ICA bulb narrowing. Visually the stenosis appears worse due to positive remodeling at the low-density plaque. 5. Moderate atheromatous narrowing of the right V4 and mid basilar.  CT Head WO Contrast 06/03/2017 IMPRESSION: 1. Several small foci of enhancement within bilateral parietal and left frontal cortex as well as left basal ganglia probably representing enhancing infarctions. 2. No large hemorrhage, focal mass effect, herniation, or hydrocephalus.  DG Chest Port 1 View 06/03/2017 IMPRESSION: 1. The ETT terminates 1.5 cm above the carina. Recommend withdrawing 1 cm. 2. New infiltrate in the lateral left lung base is nonspecific.  Recommend attention on follow-up.  DSA 06/03/2017 S/P bilateral common carotid arteriogram,and Rt vert angiogram,followed by complete revascularization of occluded  MCAs M1 segments bilaterally, with x 1 pass with embotrap 13mmx 35 mm  Retrieval device achieving a TICI3 reperfusion bilaterally.  EEG 06/03/2017 This EEG is abnormal and findings are suggestive of mild generalized cerebral dysfunction. Intermittent arm twitching was not associated with any epileptiform activity.  MRI Brain Wo Contrast 06/04/2017 IMPRESSION: 1. 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 No hemorrhagic  conversion. 2. Moderate chronic small vessel ischemic disease.    PHYSICAL EXAM  Temp:  [96.1 F (35.6 C)-98.9 F (37.2 C)] 98.7 F (37.1 C) (08/20 1200) Pulse Rate:  [66-99] 88 (08/20 1355) Resp:  [7-20] 16 (08/20 1355) BP: (140-184)/(55-100) 173/87 (08/20 1355) SpO2:  [97 %-100 %] 100 % (08/20 1355) FiO2 (%):  [28 %-100 %] 100 % (08/20 1355) Weight:  [180 lb 5.4 oz (81.8 kg)] 180 lb 5.4 oz (81.8 kg) (08/20 0410)  General - obese, well developed, intubated   Ophthalmologic - Fundi not visualized.  Cardiovascular - Regular rate and rhythm.  Neuro - intubated .Marland Kitchen Eyes closed but open on voice and following simple peripheral and central commands. Eyes moving both direction on commands, but not complete. PERRL, blinking to visual threat bilaterally and tracking. Facial symmetry not able to test due to ET tube. BUE symmetrial against gravities. LLE 4/5 at least, RLE 4-/5. DTR 1+ and no babinski. Sensation, coordination and gait not tested.   ASSESSMENT/PLAN [name] is a 66 y.o. female with history of HTN, HLD, pseudomotor cerebri, DM  presenting with headache and weakness of right hand. She did not receive IV t-PA due to out of window. CTA head and neck showed left ICA 65% stenosis. [name] consulted. During cerebral angiogram this morning, patient had acute mental status decline, became unresponsive. Code stroke called.  Found to have M1 thrombus, status post mechanical thrombectomy. MRI 06/04/2017 with numerous acute new supra- and infratentorial infarcts spanning multiple vascular territories consistent with embolic phenomena.   Stroke - bilateral MCA acute thrombosis, likely related to cerebral angiogram procedure treated with bilateral mechanical embolectomy with embotrap device with excellent revascularization  Acute mental status decline during procedure  CT negative for acute hemorrhage or infarct  CTA head and neck showed bilateral MCA acute thrombosis  S/p  mechanical thrombectomy with bilateral TICI3 reperfusion  EEG with no seizure activity  MRI showed bilateral supra-and infratentorial infarcts, consistent with cardioembolic phenomena  Continue DAPT and supportive care   Clinically much improved  Wean off vent as able  Recent stroke: acute and subacute small pathcy infarcts in the left MCA distribution, possibly artery to artery embolic due to left ICA stenosis.     Resultant  Right hand subtle weakness  MRI head Multiple small areas of acute infarct in the left parietal cortex and left occipital cortex. Numerous chronic white matter infarcts bilaterally  CTA head and neck - Large amount of noncalcified plaque at the proximal left internal carotid artery, causing 60-65% stenosis.   2D Echo  EF 60-65%  CUS - left ICA 40-59% stenosis  Cerebral angio < 50% left ICA stenosis  LDL 211  HgbA1c 9.5  SCDs for VTE prophylaxis Diet NPO time specified  aspirin 81 mg daily prior to admission, now on aspirin 325 mg daily and clopidogrel 75 mg daily.    Patient counseled to be compliant with her antithrombotic medications  Ongoing aggressive stroke risk factor management  Therapy recommendations: None  Disposition:  Pending  Left ICA stenosis  CTA neck - left ICA 60-65%  CUS - left ICA 40-59%  DSA: left ICA < 50% stenosis  No intervention needed at this time  Continue follow-up as outpatient  Respiratory failure  Intubated  On ventilation  Failed weaning trial 06/06/17  CCM on board  Hypertension  Stable Off clevidipine SBP < 160 mmHg Long-term BP goal normotensive  Hyperlipidemia  Home meds:  none  LDL 211, goal < 70  Patient not tolerating statins. self-reports leg numbness, chest pain, severe headaches with statins on multiple occassions with multiple providers  Recommend follow up with PCP Dr. Felipa Eth for consideration of PCSK9 inhibitor. Medication name given to pt.  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 Jaynee Eagles at [facility] day # 7 Plan extubate today for tolerated. Discussed with patient's husband and Dr. Lamonte Sakai and answered questions. This patient is critically ill due to code stroke status, acute mental status change, bilateral MCA thrombosis status post thrombectomy and at significant risk of neurological worsening, death form recurrent stroke, hemorrhagic transformation, seizure, brain edema, cerebral herniation. This patient's care requires constant monitoring of vital signs, hemodynamics, respiratory and cardiac monitoring, review of multiple databases, neurological assessment, discussion with family, other specialists and  medical decision making of high complexity. I spent 35 minutes of neurocritical care time in the care of this patient. I had long discussion with patient and husband, daughter and sister at bedside, updated pt current condition, treatment plan and potential prognosis. They expressed understanding and appreciation.   Morgan Contras, MD Stroke Neurology 06/07/2017 3:26 PM   To contact Stroke Continuity provider, please refer to [email or web address] After hours, contact General Neurology

## 2017-06-07 NOTE — Progress Notes (Signed)
Occupational Therapy Re-Evaluation Patient Details Name: NATALEIGH GRIFFIN MRN: [id number] DOB: [birth year] Today's Date: 06/07/2017    History of Present Illness Pt is a 66 y/o female with hx HTN, DM. She presented initially 8/13 to her PCP with headache, R hight weakness and vision changes. She was sent for MRI which revealed multiple areas of acute L MCA infarct. She was sent to ER and admitted for CVA workup. On 8/16 pt was in cath lab for diagnostic carotid and cerebral angiogram with vascular surgery when she became suddenly unresponsive with L sided weakness and possible L sided seizure-like activity. CTA revealed bilateral M1 occlusions. She was intubated and taken urgently to IR for revascularization and rapid resolution of embolism.   Clinical Impression   Pt is currently ventilated. She is demonstrating significant decline from previous OT session due to further neurologic events as detailed above, thus completed re-evaluation this date. She presents with continued R UE weakness as well as spasticity noted at elbow and wrist. Attempted to complete bed mobility but pt limited by coughing and gagging with ET tube present. She follows commands well but requires total assistance for all ADL participation at this time. Updated OT goals and D/C recommendations at this time. Will continue to follow while admitted.    Follow Up Recommendations  CIR;Supervision/Assistance - 24 hour    Equipment Recommendations  Other (comment) (TBD at next venue of care)    Recommendations for Other Services Rehab consult     Precautions / Restrictions Precautions Precautions: Fall Restrictions Weight Bearing Restrictions: No      Mobility Bed Mobility               General bed mobility comments: Pt shaking her head no when attemping to initiate bed mobility to sit at EOB. She began coughing and gagging on attempts to roll as well.  Transfers                 General transfer  comment: Unable    Balance       Sitting balance - Comments: Unable to assess balance at this time                                   ADL either performed or assessed with clinical judgement   ADL Overall ADL's : Needs assistance/impaired                                       General ADL Comments: Total assist at this time.     Vision Baseline Vision/History:  (impaired peripheral vision at baseline) Wears Glasses: Reading only Vision Assessment?: Yes Additional Comments: Able to track well. Decreased spontaneous gaze to the R. More responsive when apprached from the L.     Perception     Praxis      Pertinent Vitals/Pain Pain Assessment: Faces Faces Pain Scale: Hurts little more Pain Location: general grimacing with movement Pain Descriptors / Indicators: Discomfort;Grimacing Pain Intervention(s): Limited activity within patient's tolerance;Monitored during session;Repositioned     Hand Dominance Right   Extremity/Trunk Assessment Upper Extremity Assessment Upper Extremity Assessment: RUE deficits/detail;LUE deficits/detail   Lower Extremity Assessment Lower Extremity Assessment: Defer to PT evaluation       Communication Communication Communication: No difficulties   Cognition Arousal/Alertness: Awake/alert Behavior During Therapy: WFL for tasks assessed/performed Overall Cognitive  Status: Difficult to assess                                     General Comments  Husband present during session.    Exercises Exercises: General Lower Extremity General Exercises - Lower Extremity Short Arc Quad: 10 reps;Left   Shoulder Instructions      Home Living Family/patient expects to be discharged to:: Private residence Living Arrangements: Children;Spouse/significant other Available Help at Discharge: Family;Available 24 hours/day Type of Home: House Home Access: Stairs to enter [employer] of Steps:  4 Entrance Stairs-Rails: Right;Left;Can reach both Home Layout: One level     Bathroom Shower/Tub: Occupational psychologist: Handicapped height     Home Equipment: Grab bars - tub/shower;Grab bars - toilet;Shower seat;Cane - single point;Walker - 4 wheels          Prior Functioning/Environment Level of Independence: Independent with assistive device(s);Needs assistance  Gait / Transfers Assistance Needed: occasionally uses rollator  ADL's / Homemaking Assistance Needed: Pt reports she completes ADLs independently approx 90-95% of the time; intermittently needs assistance/supervision for bathing/dressing ADLs            OT Problem List: Decreased strength;Impaired UE functional use;Decreased range of motion      OT Treatment/Interventions: Self-care/ADL training;Therapeutic exercise;Neuromuscular education;Energy conservation;DME and/or AE instruction;Therapeutic activities;Splinting;Cognitive remediation/compensation;Visual/perceptual remediation/compensation;Patient/family education;Balance training    OT Goals(Current goals can be found in the care plan section) Acute Rehab OT Goals Patient Stated Goal: return home  OT Goal Formulation: With patient Time For Goal Achievement: 06/21/17 Potential to Achieve Goals: Fair ADL Goals Additional ADL Goal #1: Pt will complete bed mobility in preparation for ADL with overall mod assist.  Additional ADL Goal #2: Pt will tolerate sitting at EOB during ADL participation for 10 minutes with stable vital signs with moderate assistance.  Additional ADL Goal #3: Pt will demonstrate selective attention during seated grooming tasks.   OT Frequency: Min 2X/week   Barriers to D/C:            Co-evaluation PT/OT/SLP Co-Evaluation/Treatment: Yes Reason for Co-Treatment: Complexity of the patient's impairments (multi-system involvement);To address functional/ADL transfers PT goals addressed during session: Mobility/safety with  mobility;Strengthening/ROM OT goals addressed during session: ADL's and self-care      AM-PAC PT "6 Clicks" Daily Activity     Outcome Measure Help from another person eating meals?: Total Help from another person taking care of personal grooming?: Total Help from another person toileting, which includes using toliet, bedpan, or urinal?: Total Help from another person bathing (including washing, rinsing, drying)?: Total Help from another person to put on and taking off regular upper body clothing?: Total Help from another person to put on and taking off regular lower body clothing?: Total 6 Click Score: 6   End of Session Nurse Communication: Mobility status  Activity Tolerance:  (Limited by pt gagging/coughing; declining further mobility) Patient left: in bed;with family/visitor present  OT Visit Diagnosis: Muscle weakness (generalized) (M62.81)                Time: 1610-9604 OT Time Calculation (min): 25 min Charges:  OT General Charges $OT Visit: 1 Procedure OT Evaluation $OT Re-eval: 1 Procedure G-Codes:     Norman Herrlich, MS OTR/L  Pager: [place] A Shernita Rabinovich 06/07/2017, 12:59 PM

## 2017-06-07 NOTE — Procedures (Signed)
Intubation Procedure Note [name] [id number] 01/27/1951  Procedure: Intubation Indications: Airway protection and maintenance  Procedure Details Consent: Risks of procedure as well as the alternatives and risks of each were explained to the (patient/caregiver).  Consent for procedure obtained. Time Out: Verified patient identification, verified procedure, site/side was marked, verified correct patient position, special equipment/implants available, medications/allergies/relevent history reviewed, required imaging and test results available.  Performed  Maximum sterile technique was used including gloves, hand hygiene and mask.  MAC    Evaluation Hemodynamic Status: BP stable throughout; O2 sats: stable throughout Patient's Current Condition: stable Complications: No apparent complications Patient did tolerate procedure well. Chest X-ray ordered to verify placement.  CXR: pending.   [name] 06/07/2017

## 2017-06-07 NOTE — Progress Notes (Signed)
Patient in no apparent distress. Stridor is able to be heard and patient is having some pink-tinged/bloody secretions. Respiratory therapist, Collie Siad, and MD made aware. Will continue to monitor. [name]

## 2017-06-07 NOTE — Progress Notes (Signed)
Nutrition Follow Up Note   DOCUMENTATION CODES:   Not applicable  INTERVENTION:   Change to Vital 1.5 at goal rate 57ml/hr  Add- Prostat 80ml QID- each supplement provides 100kcal and 15g protein  Regimen provides 1300kcal/day, 101g/day protein, 471ml free water    Bowel regimen as needed  NUTRITION DIAGNOSIS:   Inadequate oral intake related to inability to eat as evidenced by NPO status. -ongoing  GOAL:   Provide needs based on ASPEN/SCCM guidelines  MONITOR:   Vent status, Labs, Weight trends, TF tolerance, I & O's  ASSESSMENT:   66 yo female with PMH of GERD, seizures, anemia, HTN, DM, peripheral neuropathy, HLD, pseudotumor cerebri who was admitted on 8/13 with acute R hemispheric stroke s/p thrombectomy 8/16, remained intubated afterward.   Pt continues to be ventilated. Pt intubated 8/16. Cortrak tube removed 8/17 r/t clogged. Pt with OGT in place. Pt with gastroparesis and vomited on 8/17. Pt is able to tolerate tube feeds at 32ml/hr with no vomiting. Pt on domperidone at home; initiated here today. RD will adjust tube feeds to better meet estimated needs. Per chart, pt with 19lb wt gain since admit. Spoke to RN, made her aware of wt gain and discussed tube feeds. Recommended initiation of reglan is possible. No BM since 8/15; bowel regimen as needed.       Medications reviewed and include: aspirin, colace, domperidone, lovenox, insulin, NaCl w/ dextrose @50ml /hr, pepcid, fentanyl  Labs reviewed: K 3.6 wnl, Ca 7.9(L), P 2.8 wnl, Mg 2.3 wnl Hgb 8.5(L), Hct 27.6(L) cbgs- 107, 101, 139 x 48hrs AIC 9.5(H)- 8/15  Patient is currently intubated on ventilator support MV: 6.7 L/min Temp (24hrs), Avg:98.2 F (36.8 C), Min:96.1 F (35.6 C), Max:98.9 F (37.2 C)  Propofol: none  MAP- 79-80mmHg  Diet Order:  Diet NPO time specified  Skin:   (R groin incision)  Last BM:  8/15- constipation   Height:   Ht Readings from Last 1 Encounters:  06/07/17 5\' 4"  (1.626  m)    Weight:   Wt Readings from Last 1 Encounters:  06/07/17 180 lb 5.4 oz (81.8 kg)   Ideal Body Weight:  54.5 kg  BMI:  Body mass index is 30.95 kg/m.  Estimated Nutritional Needs:   Kcal:  1400  Protein:  90-105 gm  Fluid:  1.5 L  EDUCATION NEEDS:   No education needs identified at this time  Koleen Distance MS, RD, [place] Pager #- [phone] After Hours Pager: [phone]

## 2017-06-07 NOTE — Progress Notes (Signed)
SLP Cancellation Note  Patient Details Name: DARIA MCMEEKIN MRN: [id number] DOB: 30-Aug-1951   Cancelled treatment:       Reason Eval/Treat Not Completed: Medical issues which prohibited therapy. Sign off   Kiwan G[place], Katherene Ponto 06/07/2017, 9:00 AM

## 2017-06-07 NOTE — Progress Notes (Signed)
Stridor s/p extubation w/ progressive resp distress.  Reintubated w/ # 7 Plan Full vent support  48 hrs decadron Leak test & re-assess for extubation after steroids May need trach.   Erick Colace ACNP-BC [place] Pager # [phone] OR # [phone] if no answer

## 2017-06-07 NOTE — Progress Notes (Signed)
RT called to pt's room due to audible stridor.  Rt verified stridor and informed Dr. Lamonte Sakai. Cool mist aerosol face tent ordered.

## 2017-06-07 NOTE — Progress Notes (Signed)
Cortrak Tube Team Note:  Consult received to place a Cortrak feeding tube.   A 10 F Cortrak tube was placed in the left nare and secured with a nasal bridle at 75 cm. Per the Cortrak monitor reading the tube tip is post pyloric.   No x-ray is required. RN may begin using tube.   If the tube becomes dislodged please keep the tube and contact the Cortrak team at [email or web address] (password [email or web address]) for replacement.  If after hours and replacement cannot be delayed, place a NG tube and confirm placement with an abdominal x-ray.    Morgan Distance MS, RD, LDN Pager #- [phone] After Hours Pager: [phone]

## 2017-06-07 NOTE — Progress Notes (Signed)
Patient states, "I cannot breathe." Stridor present with obvious difficulty breathing. MD made aware. Thayer Ohm D

## 2017-06-07 NOTE — Progress Notes (Signed)
PULMONARY / CRITICAL CARE MEDICINE   Name: [name] MRN: [id number] DOB: 06/01/1951    ADMISSION DATE:  05/31/2017 CONSULTATION DATE:  8/16  REFERRING MD:  Erlinda Hong   CHIEF COMPLAINT:  Vent management   HISTORY OF PRESENT ILLNESS:   66yo female with hx HTN, DM who presented initially 8/13 to her PCP with headache, R sided weakness and vision changes.  She was sent for MRI which revealed multiple areas of acute L MCA infarct.  She was sent to ER and admitted for CVA workup. On 8/16 pt was in cath lab for diagnostic carotid and cerebral angiogram with vascular surgery when she became suddenly unresponsive with L sided weakness and possible L sided seizure-like activity.  CTA revealed bilateral M1 occlusions.  She was intubated and taken urgently to IR for revascularization and rapid resolution of embolism.  PCCM consulted for ICU/vent management post procedure.   SUBJECTIVE:  Looks comfortable on SBT Strong cough.  Follows commands  VITAL SIGNS: BP (!) 161/64   Pulse 79   Temp (!) 96.1 F (35.6 C) (Axillary)   Resp 10   Ht 5\' 4"  (1.626 m)   Wt 180 lb 5.4 oz (81.8 kg)   SpO2 100%   BMI 30.95 kg/m   HEMODYNAMICS:    VENTILATOR SETTINGS: Vent Mode: CPAP;PSV FiO2 (%):  [40 %] 40 % Set Rate:  [16 bmp] 16 bmp Vt Set:  [440 mL] 440 mL PEEP:  [5 cmH20] 5 cmH20 Pressure Support:  [18 cmH20] 18 cmH20 Plateau Pressure:  [15 cmH20-23 cmH20] 19 cmH20  INTAKE / OUTPUT: I/O last 3 completed shifts: In: 1901.7 [I.V.:1694.7; NG/GT:57; IV Piggyback:150] Out: 3976 [Urine:1350; Emesis/NG output:125]  PHYSICAL EXAMINATION: General appearance:  66 Year old  female, well nourished  NAD, currently in acute distress, confused,  conversant  Eyes: anicteric sclerae icteric , moist conjunctivae; PERRL, EOMI bilaterally. Mouth:  membranes and no mucosal ulcerations; orally intubated Neck: Trachea midline; neck supple, no JVD Lungs/chest: some rhonchi, with normal respiratory effort and no  intercostal retractions. F/Vt excellent CV: RRR, no MRGs  Abdomen: Soft, non-tender; no masses or HSM Extremities: No peripheral edema  Skin: Normal temperature, turgor and texture; no rash, ulcers or subcutaneous nodules Psych: flat affect, alert follows commands. Right side is weak   LABS:  BMET  Recent Labs Lab 06/05/17 0131 06/06/17 0236 06/07/17 0227  NA 139 138 140  K 4.1 3.6 3.6  CL 111 108 109  CO2 18* 22 24  BUN 10 9 7   CREATININE 0.90 0.89 0.83  GLUCOSE 107* 101* 139*    Electrolytes  Recent Labs Lab 06/04/17 0445 06/04/17 1827 06/05/17 0131 06/06/17 0236 06/07/17 0227  CALCIUM 7.6*  --  7.5* 7.8* 7.9*  MG 1.5* 1.5*  --  1.6* 2.3  PHOS 3.6 2.9  --  2.8  --     CBC  Recent Labs Lab 06/04/17 0445 06/06/17 0236 06/07/17 0227  WBC 14.7* 10.3 9.6  HGB 8.8* 8.9* 8.5*  HCT 27.8* 28.5* 27.6*  PLT 276 258 300    Coag's  Recent Labs Lab 05/31/17 1627  APTT 25  INR 0.99    Sepsis Markers No results for input(s): LATICACIDVEN, PROCALCITON, O2SATVEN in the last 168 hours.  ABG  Recent Labs Lab 06/03/17 1417  PHART 7.431  PCO2ART 31.0*  PO2ART 206*    Liver Enzymes  Recent Labs Lab 05/31/17 1627  AST 43*  ALT 26  ALKPHOS 159*  BILITOT 0.4  ALBUMIN 3.8  Cardiac Enzymes No results for input(s): TROPONINI, PROBNP in the last 168 hours.  Glucose  Recent Labs Lab 06/06/17 1119 06/06/17 1601 06/06/17 1939 06/06/17 2332 06/07/17 0335 06/07/17 0813  GLUCAP 122* 120* 148* 155* 123* 156*    Imaging No results found.   STUDIES:  MR brain 8/12>>> Multiple small areas of acute infarct in the left parietal cortex and left occipital cortex. Recommend CTA head and neck to evaluate for source of emboli or stenosis causing these infarcts. Numerous chronic white matter infarcts bilaterally CTA head 8/16>>> 1. Right M1 embolus with subtotal occlusion but severe stenosis. The anterior temporal branch is covered and  non-opacified. 2. Elongated embolus within the left M1 and proximal M2 vessels. 3. Negative for dissection or interval ulceration in the arch and cervical carotids. 4. Carotid bifurcation atherosclerosis with approximately 50-60% left ICA bulb narrowing. Visually the stenosis appears worse due to positive remodeling at the low-density plaque. 5. Moderate atheromatous narrowing of the right V4 and mid basilar. CT head 8/16>>> 1. Several small foci of enhancement within bilateral parietal and left frontal cortex as well as left basal ganglia probably representing enhancing infarctions. 2. No large hemorrhage, focal mass effect, herniation, or Hydrocephalus. 2D echo 8/14>>>  - Left ventricle: The cavity size was normal. Systolic function was normal. The estimated ejection fraction was in the range of 60% to 65%. Wall motion was normal; there were no regional wall motion abnormalities. Doppler parameters are consistent with abnormal left ventricular relaxation (grade 1 diastolic dysfunction). MRI:  06/04/2017>>> Numerous acute new supra- and infratentorial infarcts spanning multiple vascular territories consistent with embolic phenomena. Nohemorrhagic conversion.Moderate chronic small vessel ischemic disease.  CULTURES: None  ANTIBIOTICS: None  SIGNIFICANT EVENTS: 8/16 - sudden change in mental status, to IR for bilateral embolectomy for revascularization of bilat MCA occlusions   LINES/TUBES: ETT 8/16>>> R rad aline 8/16>>>8/17  DISCUSSION: 66yo female admitted with acute L MCA stroke, then acute R MCA occlusion during diagnostic arteriogram with AMS requiring intubation.  Taken to IR for revascularization.   ASSESSMENT / PLAN:  PULMONARY Acute respiratory failure - r/t AMS  pcxr personally reviewed: left basilar vol loss  Has some yellow tracheal secretions P:   Get sputum  Extubate NPO Pulm hygiene  Wean o2   CARDIOVASCULAR Hx HTN  Off Cleviprex 8/17  P:  Cont  lopressor and PRN labetolol SBP goal 120-140   RENAL At risk for fluid and electrolyte imbalance  P:   Trend CMP Cont strict I&O F/u am chem Renal dose meds as needed   GASTROINTESTINAL Vomited 8/17 , Coretrack out 8/17  ABD film neg 8/17  Gastroparesis  P:   NPO pending extubation Cortrak if not able to take POs  HEMATOLOGIC Bilateral MCA occlusion  L ICA stenosis  P:  Cont LMWH Cont plavix  Trend cbc   INFECTIOUS Leukocytosis>> ? Reactive>resolved  Afebrile P:   Trend cbc Send sputum  Trend fever curve  PCT algo  Hold off on abx for now   ENDOCRINE DM - Hgb A1c >9 Hypoglycemia  P:   ssi    NEUROLOGIC Bilateral acute MCA occlusion- s/p IR revascularization  Left ICA stenosis P:   Dc sedation Rehab efforts per stroke team Cont plavix and asa    FAMILY  - Updates: Family updated 8/18  - Inter-disciplinary family meet or Palliative Care meeting due by:  Day 7   My cct 34 min  Erick Colace ACNP-BC [place] Pager # [phone] OR #  [phone] if no answer  Attending Note:  I have examined patient, reviewed labs, studies and notes. I have discussed the case with Jerrye Bushy, and I agree with the data and plans as amended above. 66 year old woman who experienced acute left and right MCA occlusions while she was undergoing diagnostic cerebral arteriogram. She required urgent intubation for airway protection and then went directly to interventional radiology for mechanical thrombectomies. She has significantly improved neurologically through the weekend. She is able to move all extremities, is a bit weaker on the right. She is tolerating pressure support with clear lungs. Heart regular without a murmur. Abdomen benign. We will extubate today, check respiratory culture. Continue tight blood pressure control. Continue Plavix, aspirin. Independent critical care time is 35 minutes.   Baltazar Apo, MD, PhD 06/07/2017, 12:25 PM [place]  [facility] [phone] or if no answer [phone]

## 2017-06-07 NOTE — Progress Notes (Signed)
Physical Therapy Re-Evaluation Patient Details Name: [name] MRN: [id number] DOB: 03/31/1951 Today's Date: 06/07/2017    History of Present Illness Pt is a 66 y/o female with hx HTN, DM. She presented initially 8/13 to her PCP with headache, R hight weakness and vision changes. She was sent for MRI which revealed multiple areas of acute L MCA infarct. She was sent to ER and admitted for CVA workup. On 8/16 pt was in cath lab for diagnostic carotid and cerebral angiogram with vascular surgery when she became suddenly unresponsive with L sided weakness and possible L sided seizure-like activity. CTA revealed bilateral M1 occlusions. She was intubated and taken urgently to IR for revascularization and rapid resolution of embolism.    PT Comments    Pt re-evaluated this date s/p further neurologic events mentioned above. Pt is now ventilated with continued weakness on the R side, UE weaker than LE. Attempted bed mobility this session without success. Pt limited by coughing/gagging 2 ET tube. She was able to tolerate LE exercise and repositioning in bed this session. Updated acute PT goals and initiated CIR pre-admission screen this date. Will continue to follow and progress as able per POC.   Follow Up Recommendations  CIR; 24 hour supervision     Equipment Recommendations  None recommended by PT    Recommendations for Other Services       Precautions / Restrictions Precautions Precautions: Fall Restrictions Weight Bearing Restrictions: No    Mobility  Bed Mobility               General bed mobility comments: PT attempted to initiate supine to sit but pt shaking head "no" and resisting. Then attempted roll to L side and pt began coughing and gagging. Was not able to tolerate transition to sitting EOB.   Transfers                 General transfer comment: Unable  Ambulation/Gait             General Gait Details: Unable   Stairs             Wheelchair Mobility    Modified Rankin (Stroke Patients Only) Modified Rankin (Stroke Patients Only) Pre-Morbid Rankin Score: No symptoms Modified Rankin: Severe disability     Balance       Sitting balance - Comments: Unable to assess balance at this time                                    Cognition Arousal/Alertness: Awake/alert Behavior During Therapy: WFL for tasks assessed/performed Overall Cognitive Status: Within Functional Limits for tasks assessed                                        Exercises General Exercises - Lower Extremity Short Arc Quad: 10 reps;Left    General Comments        Pertinent Vitals/Pain Pain Assessment: Faces Faces Pain Scale: Hurts little more Pain Location: general grimacing with movement Pain Descriptors / Indicators: Discomfort;Grimacing Pain Intervention(s): Limited activity within patient's tolerance;Monitored during session;Repositioned    Home Living                      Prior Function            PT Goals (current  goals can now be found in the care plan section) Acute Rehab PT Goals Patient Stated Goal: return home  PT Goal Formulation: With patient Time For Goal Achievement: 06/15/17 Potential to Achieve Goals: Good Progress towards PT goals: Goals downgraded-see care plan    Frequency    Min 3X/week      PT Plan Current plan remains appropriate    Co-evaluation PT/OT/SLP Co-Evaluation/Treatment: Yes Reason for Co-Treatment: Complexity of the patient's impairments (multi-system involvement);To address functional/ADL transfers PT goals addressed during session: Mobility/safety with mobility;Strengthening/ROM        AM-PAC PT "6 Clicks" Daily Activity  Outcome Measure  Difficulty turning over in bed (including adjusting bedclothes, sheets and blankets)?: Unable Difficulty moving from lying on back to sitting on the side of the bed? : Unable Difficulty sitting  down on and standing up from a chair with arms (e.g., wheelchair, bedside commode, etc,.)?: Unable Help needed moving to and from a bed to chair (including a wheelchair)?: Total Help needed walking in hospital room?: Total Help needed climbing 3-5 steps with a railing? : Total 6 Click Score: 6    End of Session Equipment Utilized During Treatment: Gait belt Activity Tolerance: Patient tolerated treatment well Patient left: in bed;with call bell/phone within reach;with bed alarm set Nurse Communication: Mobility status PT Visit Diagnosis: Difficulty in walking, not elsewhere classified (R26.2);Unsteadiness on feet (R26.81)     Time: 2831-5176 PT Time Calculation (min) (ACUTE ONLY): 26 min  Charges:                       G Codes:       Rolinda Roan, PT, DPT Acute Rehabilitation Services Pager: [phone]    Thelma Comp 06/07/2017, 10:42 AM

## 2017-06-07 NOTE — Progress Notes (Signed)
Rehab Admissions Coordinator Note:  Patient was screened by Retta Diones for appropriateness for an Inpatient Acute Rehab Consult.  At this time, we are recommending Inpatient Rehab consult.  Jodell Cipro M 06/07/2017, 1:10 PM  I can be reached at [phone].

## 2017-06-08 DIAGNOSIS — J9601 Acute respiratory failure with hypoxia: Secondary | ICD-10-CM

## 2017-06-08 LAB — GLUCOSE, CAPILLARY
GLUCOSE-CAPILLARY: 167 mg/dL — AB (ref 65–99)
Glucose-Capillary: 139 mg/dL — ABNORMAL HIGH (ref 65–99)
Glucose-Capillary: 149 mg/dL — ABNORMAL HIGH (ref 65–99)
Glucose-Capillary: 172 mg/dL — ABNORMAL HIGH (ref 65–99)
Glucose-Capillary: 250 mg/dL — ABNORMAL HIGH (ref 65–99)
Glucose-Capillary: 255 mg/dL — ABNORMAL HIGH (ref 65–99)
Glucose-Capillary: 257 mg/dL — ABNORMAL HIGH (ref 65–99)

## 2017-06-08 LAB — BASIC METABOLIC PANEL
ANION GAP: 7 (ref 5–15)
BUN: 16 mg/dL (ref 6–20)
CO2: 24 mmol/L (ref 22–32)
Calcium: 8.4 mg/dL — ABNORMAL LOW (ref 8.9–10.3)
Chloride: 109 mmol/L (ref 101–111)
Creatinine, Ser: 0.9 mg/dL (ref 0.44–1.00)
GFR calc Af Amer: >60 mL/min (ref >60)
GFR calc non Af Amer: >60 mL/min (ref >60)
GLUCOSE: 202 mg/dL — AB (ref 65–99)
POTASSIUM: 3.7 mmol/L (ref 3.5–5.1)
Sodium: 140 mmol/L (ref 135–145)

## 2017-06-08 MED ORDER — INSULIN ASPART 100 UNIT/ML ~~LOC~~ SOLN
4.0000 [IU] | SUBCUTANEOUS | Status: DC
  Administered 2017-06-08 – 2017-06-18 (×54): 4 [IU] via SUBCUTANEOUS

## 2017-06-08 NOTE — Progress Notes (Signed)
Inpatient Diabetes Program Recommendations  AACE/ADA: New Consensus Statement on Inpatient Glycemic Control (2015)  Target Ranges:  Prepandial:   less than 140 mg/dL      Peak postprandial:   less than 180 mg/dL (1-2 hours)      Critically ill patients:  140 - 180 mg/dL   Results for [name], [name] (MRN [id number]) as of 06/08/2017 10:45  Ref. Range 06/07/2017 08:13 06/07/2017 11:35 06/07/2017 16:07 06/07/2017 21:03 06/08/2017 00:45 06/08/2017 04:40 06/08/2017 08:09  Glucose-Capillary Latest Ref Range: 65 - 99 mg/dL 156 (H) 137 (H) 193 (H) 279 (H) 255 (H) 172 (H) 149 (H)   Review of Glycemic Control  Current orders for Inpatient glycemic control: Novolog 0-15 units Q4H  Inpatient Diabetes Program Recommendations:  Correction (SSI):  Noted patient was re-intubated and ordered Decadron which is contributing to hyperglycemia. Please consider discontinuing current glycemic control orders and use ICU Glycemic Control order set to order CBGs and Novolog correction Q4H.  Thanks, Barnie Alderman, RN, MSN, CDE Diabetes Coordinator Inpatient Diabetes Program [phone] (Team Pager from 8am to 5pm)

## 2017-06-08 NOTE — Care Management Note (Signed)
Case Management Note  Patient Details  Name: [name] MRN: [id number] Date of Birth: [birth year]  Subjective/Objective:   Pt admitted on 05/31/17 with acute Lt MCA stroke.  PTA, pt resided at home with spouse.                   Action/Plan: Pt emergently reintubated on 06/08/17.  Possible tracheostomy later in the week if no cuff leak on 06/09/17.  Will follow progress.    Expected Discharge Date:                  Expected Discharge Plan:  [place]  In-House Referral:     Discharge planning Services  CM Consult  Post Acute Care Choice:    Choice offered to:     DME Arranged:    DME Agency:     HH Arranged:    [place] Agency:     Status of Service:  In process, will continue to follow  If discussed at Long Length of Stay Meetings, dates discussed:    Additional Comments:  Reinaldo Raddle, RN, BSN  Trauma/Neuro ICU Case Manager [phone]

## 2017-06-08 NOTE — Progress Notes (Signed)
STROKE TEAM PROGRESS NOTE   SUBJECTIVE (INTERVAL HISTORY) Her daughter is  bedside. Pt  was extubated yesterday but developed respiratory stridor requiring emergent reintubation. Presently lightly sedated on ventilator but able to open eyes on voice and follows simple central and peripheral commands.   OBJECTIVE Temp:  [98.1 F (36.7 C)-99.3 F (37.4 C)] 99.3 F (37.4 C) (08/21 1200) Pulse Rate:  [75-96] 79 (08/21 1200) Cardiac Rhythm: Normal sinus rhythm (08/21 0800) Resp:  [14-20] 18 (08/21 1200) BP: (115-180)/(57-103) 157/69 (08/21 1200) SpO2:  [97 %-100 %] 100 % (08/21 1200) FiO2 (%):  [28 %-100 %] 40 % (08/21 1123) Weight:  [165 lb 2 oz (74.9 kg)] 165 lb 2 oz (74.9 kg) (08/21 0500)  CBC:   Recent Labs Lab 06/04/17 0445 06/06/17 0236 06/07/17 0227  WBC 14.7* 10.3 9.6  NEUTROABS 12.7*  --   --   HGB 8.8* 8.9* 8.5*  HCT 27.8* 28.5* 27.6*  MCV 85.5 88.5 87.1  PLT 276 258 973    Basic Metabolic Panel:   Recent Labs Lab 06/04/17 1827  06/06/17 0236 06/07/17 0227 06/08/17 0245  NA  --   < > 138 140 140  K  --   < > 3.6 3.6 3.7  CL  --   < > 108 109 109  CO2  --   < > 22 24 24   GLUCOSE  --   < > 101* 139* 202*  BUN  --   < > 9 7 16   CREATININE  --   < > 0.89 0.83 0.90  CALCIUM  --   < > 7.8* 7.9* 8.4*  MG 1.5*  --  1.6* 2.3  --   PHOS 2.9  --  2.8  --   --   < > = values in this interval not displayed.  Lipid Panel:     Component Value Date/Time   CHOL 297 (H) 06/02/2017 0741   TRIG 224 (H) 06/07/2017 1519   HDL 49 06/02/2017 0741   CHOLHDL 6.1 06/02/2017 0741   VLDL 37 06/02/2017 0741   LDLCALC 211 (H) 06/02/2017 0741   HgbA1c:  Lab Results  Component Value Date   HGBA1C 9.5 (H) 06/02/2017   Urine Drug Screen: No results found for: LABOPIA, COCAINSCRNUR, LABBENZ, AMPHETMU, THCU, LABBARB  Alcohol Level No results found for: [place] I have personally reviewed the radiological images below and agree with the radiology interpretations.  Ct Angio  Head and neck W Or Wo Contrast 05/31/2017 IMPRESSION: 1. No emergent large vessel occlusion. 2. Large amount of noncalcified plaque at the proximal left internal carotid artery, causing 60-65% stenosis. This is the most likely source of emboli for the previously identified left parietal and occipital infarcts. 3. No other intracranial or cervical arterial stenosis. 4. Minimal Aortic Atherosclerosis (ICD10-I70.0).   Mr Jeri Cos Wo Contrast 05/30/2017 IMPRESSION: Multiple small areas of acute infarct in the left parietal cortex and left occipital cortex. Recommend CTA head and neck to evaluate for source of emboli or stenosis causing these infarcts Numerous chronic white matter infarcts bilaterally   CUS 06/01/2017 1-39% right internal carotid artery stenosis and 40-59% left internal carotid artery stenosis. Vertebral arteries are patent with antegrade flow.  TTE 8/114/2018  - Left ventricle: The cavity size was normal. Systolic function was normal. The estimated ejection fraction was in the range of 60% to 65%. Wall motion was normal; there were no regional wall motion abnormalities. Doppler parameters are consistent with abnormal left ventricular relaxation (grade  1 diastolic dysfunction).  Carotid Angiography 06/03/2017 FINDING(S):  Type I aortic arch: widely patent  Innominate artery: widely patent ? R common carotid artery: widely patent ? R internal carotid artery: minimal disease, diffuse atherosclerotic plaque suggested ? R external carotid artery: minimal disease, diffuse atherosclerotic plaque suggested ? R subclavian artery: patent ? R vertebral artery: patent proximally  Left common carotid artery: patent ? L internal carotid artery: patent, 43% stenosis by NASCET proximally ? L external carotid artery: patent  Left subclavian artery: patent ? Left vertebral artery: patent  Intracranial findings to be read by Neuroradiology  CT Head Code Stroke  06/03/2017 IMPRESSION: 1.  No acute finding or change from 3 days ago. 2. 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 Chronic small vessel ischemic changes elsewhere. are stable  CTA Head/Neck 06/03/2017 IMPRESSION: 1. Right M1 embolus with subtotal occlusion but severe stenosis. The anterior temporal branch is covered and non-opacified.  2. Elongated embolus within the left M1 and proximal M2 vessels. 3. Negative for dissection or interval ulceration in the arch and cervical carotids. 4. Carotid bifurcation atherosclerosis with approximately 50-60% left ICA bulb narrowing. Visually the stenosis appears worse due to positive remodeling at the low-density plaque. 5. Moderate atheromatous narrowing of the right V4 and mid basilar.  CT Head WO Contrast 06/03/2017 IMPRESSION: 1. Several small foci of enhancement within bilateral parietal and left frontal cortex as well as left basal ganglia probably representing enhancing infarctions. 2. No large hemorrhage, focal mass effect, herniation, or hydrocephalus.  DG Chest Port 1 View 06/03/2017 IMPRESSION: 1. The ETT terminates 1.5 cm above the carina. Recommend withdrawing 1 cm. 2. New infiltrate in the lateral left lung base is nonspecific.  Recommend attention on follow-up.  DSA 06/03/2017 S/P bilateral common carotid arteriogram,and Rt vert angiogram,followed by complete revascularization of occluded  MCAs M1 segments bilaterally, with x 1 pass with embotrap 87mmx 35 mm  Retrieval device achieving a TICI3 reperfusion bilaterally.  EEG 06/03/2017 This EEG is abnormal and findings are suggestive of mild generalized cerebral dysfunction. Intermittent arm twitching was not associated with any epileptiform activity.  MRI Brain Wo Contrast 06/04/2017 IMPRESSION: 1. 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 No hemorrhagic conversion. 2. Moderate  chronic small vessel ischemic disease.    PHYSICAL EXAM  Temp:  [98.1 F (36.7 C)-99.3 F (37.4 C)] 99.3 F (37.4 C) (08/21 1200) Pulse Rate:  [75-96] 79 (08/21 1200) Resp:  [14-20] 18 (08/21 1200) BP: (115-180)/(57-103) 157/69 (08/21 1200) SpO2:  [97 %-100 %] 100 % (08/21 1200) FiO2 (%):  [28 %-100 %] 40 % (08/21 1123) Weight:  [165 lb 2 oz (74.9 kg)] 165 lb 2 oz (74.9 kg) (08/21 0500)  General - obese, well developed, intubated   Ophthalmologic - Fundi not visualized.  Cardiovascular - Regular rate and rhythm.  Neuro - intubated .Marland Kitchen Eyes closed but open on voice and following simple peripheral and central commands. Eyes moving both direction on commands, but not complete. PERRL, blinking to visual threat bilaterally and tracking. Facial symmetry not able to test due to ET tube. BUE symmetrial against gravities. LLE 4/5 at least, RLE 4-/5. DTR 1+ and no babinski. Sensation, coordination and gait not tested.   ASSESSMENT/PLAN Ms. YSABELA KEISLER is a 66 y.o. female with history of HTN, HLD, pseudomotor cerebri, DM  presenting with headache and weakness of right hand. She did not receive IV t-PA due to out of  window. CTA head and neck showed left ICA 65% stenosis. Dr. Bridgett Larsson consulted. During cerebral angiogram this morning, patient had acute mental status decline, became unresponsive. Code stroke called.  Found to have M1 thrombus, status post mechanical thrombectomy. MRI 06/04/2017 with numerous acute new supra- and infratentorial infarcts spanning multiple vascular territories consistent with embolic phenomena.   Stroke - bilateral MCA acute thrombosis, likely related to cerebral angiogram procedure treated with bilateral mechanical embolectomy with embotrap device with excellent revascularization  Acute mental status decline during procedure  CT negative for acute hemorrhage or infarct  CTA head and neck showed bilateral MCA acute thrombosis  S/p mechanical thrombectomy with  bilateral TICI3 reperfusion  EEG with no seizure activity  MRI showed bilateral supra-and infratentorial infarcts, consistent with cardioembolic phenomena  Continue DAPT and supportive care   Clinically much improved  Wean off vent as able  Recent stroke: acute and subacute small pathcy infarcts in the left MCA distribution, possibly artery to artery embolic due to left ICA stenosis.     Resultant  Right hand subtle weakness  MRI head Multiple small areas of acute infarct in the left parietal cortex and left occipital cortex. Numerous chronic white matter infarcts bilaterally  CTA head and neck - Large amount of noncalcified plaque at the proximal left internal carotid artery, causing 60-65% stenosis.   2D Echo  EF 60-65%  CUS - left ICA 40-59% stenosis  Cerebral angio < 50% left ICA stenosis  LDL 211  HgbA1c 9.5  SCDs for VTE prophylaxis Diet NPO time specified  aspirin 81 mg daily prior to admission, now on aspirin 325 mg daily and clopidogrel 75 mg daily.    Patient counseled to be compliant with her antithrombotic medications  Ongoing aggressive stroke risk factor management  Therapy recommendations: None  Disposition:  Pending  Left ICA stenosis  CTA neck - left ICA 60-65%  CUS - left ICA 40-59%  DSA: left ICA < 50% stenosis  No intervention needed at this time  Continue follow-up as outpatient  Respiratory failure  Intubated  On ventilation  Failed weaning trial 06/06/17  CCM on board  Hypertension  Stable Off clevidipine SBP < 160 mmHg Long-term BP goal normotensive  Hyperlipidemia  Home meds:  none  LDL 211, goal < 70  Patient not tolerating statins. self-reports leg numbness, chest pain, severe headaches with statins on multiple occassions with multiple providers  Recommend follow up with PCP Dr. Felipa Eth for consideration of PCSK9 inhibitor. Medication name given to pt.  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 Jaynee Eagles at [facility] day # 8 Plan : Continue ventilatory support and short course of steroids and extubation as per P CCM.Marland Kitchen Discussed with patient's daughter and Dr. Lamonte Sakai and answered questions. This patient is critically ill due to code stroke status, acute mental status change, bilateral MCA thrombosis status post thrombectomy and at significant risk of neurological worsening, death form recurrent stroke, hemorrhagic transformation, seizure, brain edema, cerebral herniation. This patient's care requires constant monitoring of vital signs, hemodynamics, respiratory and cardiac monitoring, review of multiple databases, neurological assessment, discussion with family, other specialists and medical decision making of high complexity. I  spent 32 minutes of neurocritical care time in the care of this patient.  Antony Contras, MD Stroke Neurology 06/08/2017 12:53 PM   To contact Stroke Continuity provider, please refer to [email or web address] After hours, contact General Neurology

## 2017-06-08 NOTE — Progress Notes (Signed)
PULMONARY / CRITICAL CARE MEDICINE   Name: [name] MRN: [id number] DOB: Aug 26, 1951    ADMISSION DATE:  05/31/2017 CONSULTATION DATE:  8/16  REFERRING MD:  Erlinda Hong   CHIEF COMPLAINT:  Vent management   HISTORY OF PRESENT ILLNESS:   66yo female with hx HTN, DM who presented initially 8/13 to her PCP with headache, R sided weakness and vision changes.  She was sent for MRI which revealed multiple areas of acute L MCA infarct.  She was sent to ER and admitted for CVA workup. On 8/16 pt was in cath lab for diagnostic carotid and cerebral angiogram with vascular surgery when she became suddenly unresponsive with L sided weakness and possible L sided seizure-like activity.  CTA revealed bilateral M1 occlusions.  She was intubated and taken urgently to IR for revascularization and rapid resolution of embolism.  PCCM consulted for ICU/vent management post procedure.   SUBJECTIVE:  Looks comfortable  No distress  Follows commands  VITAL SIGNS: BP (!) 169/87   Pulse 88   Temp 99.2 F (37.3 C) (Axillary)   Resp 15   Ht 5\' 4"  (1.626 m)   Wt 165 lb 2 oz (74.9 kg)   SpO2 100%   BMI 28.34 kg/m   HEMODYNAMICS:    VENTILATOR SETTINGS: Vent Mode: CPAP;PSV FiO2 (%):  [28 %-100 %] 40 % Set Rate:  [16 bmp] 16 bmp Vt Set:  [440 mL] 440 mL PEEP:  [5 cmH20] 5 cmH20 Pressure Support:  [8 cmH20] 8 cmH20 Plateau Pressure:  [14 cmH20-19 cmH20] 14 cmH20  INTAKE / OUTPUT:  Intake/Output Summary (Last 24 hours) at 06/08/17 1048 Last data filed at 06/08/17 0800  Gross per 24 hour  Intake          1411.37 ml  Output              920 ml  Net           491.37 ml   General appearance:  66 Year old  female, well nourished,  NAD, on vent Eyes: anicteric sclerae, moist conjunctivae; PERRL, EOMI bilaterally. Mouth:  membranes and no mucosal ulcerations; Orally intubated  Lungs/chest: scattered rhonchi, with normal respiratory effort and no intercostal retractions.  CV: RRR, no MRGs  Abdomen:  Soft, non-tender; no masses or HSM Extremities: No peripheral edema or extremity lymphadenopathy Skin: Normal temperature, turgor and texture; no rash, ulcers or subcutaneous nodules Psych: sedated on vent. Follows commands   LABS:  BMET  Recent Labs Lab 06/06/17 0236 06/07/17 0227 06/08/17 0245  NA 138 140 140  K 3.6 3.6 3.7  CL 108 109 109  CO2 22 24 24   BUN 9 7 16   CREATININE 0.89 0.83 0.90  GLUCOSE 101* 139* 202*    Electrolytes  Recent Labs Lab 06/04/17 0445 06/04/17 1827  06/06/17 0236 06/07/17 0227 06/08/17 0245  CALCIUM 7.6*  --   < > 7.8* 7.9* 8.4*  MG 1.5* 1.5*  --  1.6* 2.3  --   PHOS 3.6 2.9  --  2.8  --   --   < > = values in this interval not displayed.  CBC  Recent Labs Lab 06/04/17 0445 06/06/17 0236 06/07/17 0227  WBC 14.7* 10.3 9.6  HGB 8.8* 8.9* 8.5*  HCT 27.8* 28.5* 27.6*  PLT 276 258 300    Coag's No results for input(s): APTT, INR in the last 168 hours.  Sepsis Markers No results for input(s): LATICACIDVEN, PROCALCITON, O2SATVEN in the last 168 hours.  ABG  Recent Labs Lab 06/03/17 1417 06/07/17 1542  PHART 7.431 7.421  PCO2ART 31.0* 43.7  PO2ART 206* 393.0*    Liver Enzymes No results for input(s): AST, ALT, ALKPHOS, BILITOT, ALBUMIN in the last 168 hours.  Cardiac Enzymes No results for input(s): TROPONINI, PROBNP in the last 168 hours.  Glucose  Recent Labs Lab 06/07/17 1135 06/07/17 1607 06/07/17 2103 06/08/17 0045 06/08/17 0440 06/08/17 0809  GLUCAP 137* 193* 279* 255* 172* 149*    Imaging Portable Chest Xray  Result Date: 06/07/2017 CLINICAL DATA:  Acute respiratory failure with hypoxia. EXAM: PORTABLE CHEST 1 VIEW COMPARISON:  Portable chest x-ray of June 06, 2017 FINDINGS: The lungs are hypoinflated. The retrocardiac region on the left is more dense and there is blunting of the lateral costophrenic angle. The mediastinum is normal in width.The endotracheal tube tip lies at the level of the  clavicular heads which is approximately 2.2 cm above the carina. The feeding tube tip projects below the inferior margin of the image. The cardiac silhouette is mildly enlarged but stable. IMPRESSION: Left lower lobe atelectasis or pneumonia worsening since yesterday's study. Probable small left pleural effusion as well. Mild cardiomegaly without significant pulmonary vascular congestion. The support tubes are in reasonable position. Electronically Signed   By: David  Martinique M.D.   On: 06/07/2017 15:01     STUDIES:  MR brain 8/12>>> Multiple small areas of acute infarct in the left parietal cortex and left occipital cortex. Recommend CTA head and neck to evaluate for source of emboli or stenosis causing these infarcts. Numerous chronic white matter infarcts bilaterally CTA head 8/16>>> 1. Right M1 embolus with subtotal occlusion but severe stenosis. The anterior temporal branch is covered and non-opacified. 2. Elongated embolus within the left M1 and proximal M2 vessels. 3. Negative for dissection or interval ulceration in the arch and cervical carotids. 4. Carotid bifurcation atherosclerosis with approximately 50-60% left ICA bulb narrowing. Visually the stenosis appears worse due to positive remodeling at the low-density plaque. 5. Moderate atheromatous narrowing of the right V4 and mid basilar. CT head 8/16>>> 1. Several small foci of enhancement within bilateral parietal and left frontal cortex as well as left basal ganglia probably representing enhancing infarctions. 2. No large hemorrhage, focal mass effect, herniation, or Hydrocephalus. 2D echo 8/14>>>  - Left ventricle: The cavity size was normal. Systolic function was normal. The estimated ejection fraction was in the range of 60% to 65%. Wall motion was normal; there were no regional wall motion abnormalities. Doppler parameters are consistent with abnormal left ventricular relaxation (grade 1 diastolic dysfunction). MRI:   06/04/2017>>> Numerous acute new supra- and infratentorial infarcts spanning multiple vascular territories consistent with embolic phenomena. Nohemorrhagic conversion.Moderate chronic small vessel ischemic disease.  CULTURES: None  ANTIBIOTICS: None  SIGNIFICANT EVENTS: 8/16 - sudden change in mental status, to IR for bilateral embolectomy for revascularization of bilat MCA occlusions   LINES/TUBES: ETT 8/16>>>8/20>>> R rad aline 8/16>>>8/17  DISCUSSION: 66yo female admitted with acute L MCA stroke, then acute R MCA occlusion during diagnostic arteriogram with AMS requiring intubation.  Taken to IR for revascularization. Neuro exam strong.  Cough good.  Had upper airway edema. Also concern for possible PNA Sputum sent prior to extubation Now back on vent Cont decadron.  Follow cultures May need abx Leak test in am, may need trach    ASSESSMENT / PLAN:  PULMONARY Acute respiratory failure - r/t AMS  Failed extubation d/t UAW obstruction/stridor r/t vocal cord edema Decadron started 8/20 pcxr personally reviewed:  basilar vol loss atx vs PNA P: Cont full vent support Cont decadron Cuff leak test 8/22 if no leak plan for trach 8/23 Cont PAD protocol RAS goal -1 Sending PCT see ID section/ sputum w/ abundant GPC. Low threshold for abx      CARDIOVASCULAR Hx HTN  Off Cleviprex 8/17  P:  Cont lopressor and PRN labetolol SBP goal 120-140   RENAL At risk for fluid and electrolyte imbalance  P:   Trend chemistries & replace as needed Strict I&O   GASTROINTESTINAL Vomited 8/17 , Coretrack out 8/17  ABD film neg 8/17  Gastroparesis  P:   tubefeeds via cortrak PPI for SUP  HEMATOLOGIC Bilateral MCA occlusion  L ICA stenosis  P:  Trend cbc Transfuse per protocol    INFECTIOUS Leukocytosis>> ? Reactive>resolved  Afebrile Sent sputum 8/20 -->GPC prelim P: F/u culture data Send PCT  Low threshold for abx     ENDOCRINE DM - Hgb A1c >9 Hypoglycemia   P:   ssi add some basal coverage    NEUROLOGIC Bilateral acute MCA occlusion- s/p IR revascularization  Left ICA stenosis P:   RAS goal -1 Cont plavix and asa Aggressive rehab effort  FAMILY  - Updates: Family updated 8/18  - Inter-disciplinary family meet or Palliative Care meeting due by:  Day 7   My cct 32 min  Erick Colace ACNP-BC [place] Pager # [phone] OR # [phone] if no answer  Attending Note:  I have examined patient, reviewed labs, studies and notes. I have discussed the case with [name], and I agree with the data and plans as amended above. CT 47-year-old woman status post acute left and right MCA occlusions while she was undergoing a diagnostic cerebral angiogram. She underwent urgent intubation for airway protection and then urgent interventional radiology for mechanical thrombectomies. Neurologically she has improved, is able to interact, follow commands, move bilateral extremities. She is a bit weaker on the right than the left. She was extubated on 8/20 but experienced stridor and progressive respiratory distress. She was successfully reintubated. Note was made that there was some cord edema at the time of reintubation. Steroids been initiated. We will continue her current ventilator support, steroids for 48 hours and then assess for cuff leak. If present then could reattempt extubation. If not present or if she fails then early tracheostomy may facilitate more rapid overall progress. Continue blood pressure control, continue Plavix, aspirin. Independent critical care time is 34 minutes.   Baltazar Apo, MD, PhD 06/08/2017, 12:15 PM [facility] [phone] or if no answer [phone]

## 2017-06-09 LAB — GLUCOSE, CAPILLARY
GLUCOSE-CAPILLARY: 171 mg/dL — AB (ref 65–99)
GLUCOSE-CAPILLARY: 174 mg/dL — AB (ref 65–99)
GLUCOSE-CAPILLARY: 174 mg/dL — AB (ref 65–99)
Glucose-Capillary: 196 mg/dL — ABNORMAL HIGH (ref 65–99)
Glucose-Capillary: 199 mg/dL — ABNORMAL HIGH (ref 65–99)
Glucose-Capillary: 212 mg/dL — ABNORMAL HIGH (ref 65–99)

## 2017-06-09 LAB — CULTURE, RESPIRATORY: SPECIAL REQUESTS: NORMAL

## 2017-06-09 LAB — CULTURE, RESPIRATORY W GRAM STAIN

## 2017-06-09 MED ORDER — CEFAZOLIN SODIUM-DEXTROSE 2-4 GM/100ML-% IV SOLN
2.0000 g | Freq: Three times a day (TID) | INTRAVENOUS | Status: DC
  Administered 2017-06-09 – 2017-06-17 (×25): 2 g via INTRAVENOUS
  Filled 2017-06-09 (×31): qty 100

## 2017-06-09 MED ORDER — FENTANYL CITRATE (PF) 100 MCG/2ML IJ SOLN
200.0000 ug | Freq: Once | INTRAMUSCULAR | Status: AC
  Administered 2017-06-10: 100 ug via INTRAVENOUS
  Filled 2017-06-09: qty 4

## 2017-06-09 MED ORDER — METOPROLOL TARTRATE 100 MG PO TABS
100.0000 mg | ORAL_TABLET | Freq: Two times a day (BID) | ORAL | Status: DC
  Administered 2017-06-09 – 2017-06-18 (×18): 100 mg
  Filled 2017-06-09 (×3): qty 1
  Filled 2017-06-09 (×3): qty 2
  Filled 2017-06-09 (×8): qty 1
  Filled 2017-06-09 (×3): qty 2
  Filled 2017-06-09 (×2): qty 1

## 2017-06-09 MED ORDER — MIDAZOLAM HCL 2 MG/2ML IJ SOLN
4.0000 mg | Freq: Once | INTRAMUSCULAR | Status: AC
  Administered 2017-06-10: 2 mg via INTRAVENOUS
  Filled 2017-06-09: qty 4

## 2017-06-09 MED ORDER — VECURONIUM BROMIDE 10 MG IV SOLR
10.0000 mg | Freq: Once | INTRAVENOUS | Status: AC
  Administered 2017-06-10: 10 mg via INTRAVENOUS
  Filled 2017-06-09: qty 10

## 2017-06-09 MED ORDER — CHLORHEXIDINE GLUCONATE 0.12 % MT SOLN
OROMUCOSAL | Status: AC
  Filled 2017-06-09: qty 15

## 2017-06-09 MED ORDER — ETOMIDATE 2 MG/ML IV SOLN
40.0000 mg | Freq: Once | INTRAVENOUS | Status: AC
  Administered 2017-06-10: 20 mg via INTRAVENOUS
  Filled 2017-06-09: qty 20

## 2017-06-09 MED ORDER — CLONIDINE HCL 0.3 MG/24HR TD PTWK
0.3000 mg | MEDICATED_PATCH | TRANSDERMAL | Status: DC
  Administered 2017-06-09 – 2017-06-16 (×2): 0.3 mg via TRANSDERMAL
  Filled 2017-06-09 (×3): qty 1

## 2017-06-09 NOTE — Progress Notes (Signed)
Physical Therapy Treatment Patient Details Name: [name] MRN: [id number] DOB: January 01, 1951 Today's Date: 06/09/2017    History of Present Illness Pt is a 66 y/o female with hx HTN, DM. She presented initially 8/13 to her PCP with headache, R hight weakness and vision changes. She was sent for MRI which revealed multiple areas of acute L MCA infarct. She was sent to ER and admitted for CVA workup. On 8/16 pt was in cath lab for diagnostic carotid and cerebral angiogram with vascular surgery when she became suddenly unresponsive with L sided weakness and possible L sided seizure-like activity. CTA revealed bilateral M1 occlusions. She was intubated and taken urgently to IR for revascularization and rapid resolution of embolism.    PT Comments    Patient seen for activity progression on vent. Patient tolerated EOB sitting with total assist. Lethargic during session with difficulty engaging but was able to follow some simple commands intermittently during session. At this time, patient session limited due to sedation and intubation. Will need to follow for progress as these elements become discontinued. At this time, will continue with current POC but may need to reassess if patient does not progress. Will see as indicated and progress as tolerated.   Follow Up Recommendations  CIR;Supervision/Assistance - 24 hour (pending progress post extubation)     Equipment Recommendations   (TBD by next venue of care)    Recommendations for Other Services Rehab consult     Precautions / Restrictions Precautions Precautions: Fall Precaution Comments: NG tube, ET tube Restrictions Weight Bearing Restrictions: No    Mobility  Bed Mobility Overal bed mobility: Needs Assistance Bed Mobility: Supine to Sit;Sit to Supine     Supine to sit: Total assist;+2 for physical assistance Sit to supine: Total assist;+2 for physical assistance   General bed mobility comments: Total assist +2 for all  aspects of bed mobility  Transfers                    Ambulation/Gait                 Stairs            Wheelchair Mobility    Modified Rankin (Stroke Patients Only)       Balance Overall balance assessment: Needs assistance Sitting-balance support: Feet supported;Bilateral upper extremity supported Sitting balance-Leahy Scale: Zero Sitting balance - Comments: Max assist to maintain sitting balance x7 minutes. Posterior and L lateral pushing at times                                    Cognition Arousal/Alertness: Lethargic Behavior During Therapy: Flat affect Overall Cognitive Status: Difficult to assess                                 General Comments: Pt intermittently following one step commands appropriately. Shakes head "no" to most questions.      Exercises General Exercises - Upper Extremity Shoulder Flexion: PROM;Both;5 reps;Supine Elbow Flexion: PROM;Both;5 reps;Supine General Exercises - Lower Extremity Ankle Circles/Pumps: PROM;Both;5 reps;Supine Heel Slides: PROM;Both;5 reps;Supine Hip Flexion/Marching: PROM;Both;5 reps;Supine    General Comments        Pertinent Vitals/Pain Pain Assessment: Faces Faces Pain Scale: Hurts little more Pain Location: general grimacing with movement Pain Descriptors / Indicators: Discomfort;Grimacing Pain Intervention(s): Monitored during session;Repositioned    Home  Living                      Prior Function            PT Goals (current goals can now be found in the care plan section) Acute Rehab PT Goals Patient Stated Goal: none stated PT Goal Formulation: With patient Time For Goal Achievement: 06/15/17 Potential to Achieve Goals: Fair Progress towards PT goals: Not progressing toward goals - comment    Frequency    Min 3X/week      PT Plan Discharge plan needs to be updated    Co-evaluation PT/OT/SLP Co-Evaluation/Treatment:  Yes Reason for Co-Treatment: Complexity of the patient's impairments (multi-system involvement);For patient/therapist safety PT goals addressed during session: Mobility/safety with mobility OT goals addressed during session: Strengthening/ROM      AM-PAC PT "6 Clicks" Daily Activity  Outcome Measure  Difficulty turning over in bed (including adjusting bedclothes, sheets and blankets)?: Unable Difficulty moving from lying on back to sitting on the side of the bed? : Unable Difficulty sitting down on and standing up from a chair with arms (e.g., wheelchair, bedside commode, etc,.)?: Unable Help needed moving to and from a bed to chair (including a wheelchair)?: Total Help needed walking in hospital room?: Total Help needed climbing 3-5 steps with a railing? : Total 6 Click Score: 6    End of Session Equipment Utilized During Treatment: Gait belt Activity Tolerance: Patient tolerated treatment well Patient left: in bed;with call bell/phone within reach;with bed alarm set Nurse Communication: Mobility status PT Visit Diagnosis: Difficulty in walking, not elsewhere classified (R26.2);Unsteadiness on feet (R26.81)     Time: 6045-4098 PT Time Calculation (min) (ACUTE ONLY): 25 min  Charges:  $Therapeutic Activity: 8-22 mins                    G Codes:       Alben Deeds, PT DPT  Board Certified Neurologic Specialist [place] 06/09/2017, 2:38 PM

## 2017-06-09 NOTE — Progress Notes (Signed)
CCM notified of multiple episodes of runs of [place]. Pt in no distress. No new orders at this time. Will continue to monitor.Lianne Bushy RN BSN

## 2017-06-09 NOTE — Progress Notes (Addendum)
PULMONARY / CRITICAL CARE MEDICINE   Name: [name] MRN: [id number] DOB: 1951/04/18    ADMISSION DATE:  05/31/2017 CONSULTATION DATE:  8/16  REFERRING MD:  Erlinda Hong   CHIEF COMPLAINT:  Vent management   HISTORY OF PRESENT ILLNESS:   66yo female with hx HTN, DM who presented initially 8/13 to her PCP with headache, R sided weakness and vision changes.  She was sent for MRI which revealed multiple areas of acute L MCA infarct.  She was sent to ER and admitted for CVA workup. On 8/16 pt was in cath lab for diagnostic carotid and cerebral angiogram with vascular surgery when she became suddenly unresponsive with L sided weakness and possible L sided seizure-like activity.  CTA revealed bilateral M1 occlusions.  She was intubated and taken urgently to IR for revascularization and rapid resolution of embolism.  PCCM consulted for ICU/vent management post procedure.   SUBJECTIVE:  Tolerating pressure support but no cuff leak  VITAL SIGNS: BP (!) 165/72   Pulse 84   Temp 98.4 F (36.9 C) (Axillary)   Resp 17   Ht 5\' 4"  (1.626 m)   Wt 76.6 kg (168 lb 14 oz)   SpO2 100%   BMI 28.99 kg/m   HEMODYNAMICS:    VENTILATOR SETTINGS: Vent Mode: CPAP;PSV FiO2 (%):  [40 %] 40 % Set Rate:  [16 bmp] 16 bmp Vt Set:  [440 mL] 440 mL PEEP:  [5 cmH20] 5 cmH20 Pressure Support:  [5 cmH20-8 cmH20] 8 cmH20 Plateau Pressure:  [15 cmH20-16 cmH20] 15 cmH20  INTAKE / OUTPUT:  Intake/Output Summary (Last 24 hours) at 06/09/17 1021 Last data filed at 06/09/17 0900  Gross per 24 hour  Intake          1617.19 ml  Output             1300 ml  Net           317.19 ml   General appearance:  Obese, no distress, ventilated Eyes: Pupils equal, eye movement normal Mouth:  ET tube in place, oropharynx clear Lungs/chest: Normal respiratory effort, scattered rhonchi, no wheezing CV: Regular, no murmur Abdomen: Soft, nontender, positive bowel sounds Extremities: No edema Skin: No rash Neuro: Awake,  follows commands, strong cough   LABS:  BMET  Recent Labs Lab 06/06/17 0236 06/07/17 0227 06/08/17 0245  NA 138 140 140  K 3.6 3.6 3.7  CL 108 109 109  CO2 22 24 24   BUN 9 7 16   CREATININE 0.89 0.83 0.90  GLUCOSE 101* 139* 202*    Electrolytes  Recent Labs Lab 06/04/17 0445 06/04/17 1827  06/06/17 0236 06/07/17 0227 06/08/17 0245  CALCIUM 7.6*  --   < > 7.8* 7.9* 8.4*  MG 1.5* 1.5*  --  1.6* 2.3  --   PHOS 3.6 2.9  --  2.8  --   --   < > = values in this interval not displayed.  CBC  Recent Labs Lab 06/04/17 0445 06/06/17 0236 06/07/17 0227  WBC 14.7* 10.3 9.6  HGB 8.8* 8.9* 8.5*  HCT 27.8* 28.5* 27.6*  PLT 276 258 300    Coag's No results for input(s): APTT, INR in the last 168 hours.  Sepsis Markers No results for input(s): LATICACIDVEN, PROCALCITON, O2SATVEN in the last 168 hours.  ABG  Recent Labs Lab 06/03/17 1417 06/07/17 1542  PHART 7.431 7.421  PCO2ART 31.0* 43.7  PO2ART 206* 393.0*    Liver Enzymes No results for input(s): AST, ALT, ALKPHOS,  BILITOT, ALBUMIN in the last 168 hours.  Cardiac Enzymes No results for input(s): TROPONINI, PROBNP in the last 168 hours.  Glucose  Recent Labs Lab 06/08/17 1150 06/08/17 1548 06/08/17 1937 06/08/17 2356 06/09/17 0353 06/09/17 0801  GLUCAP 250* 257* 167* 139* 196* 171*    Imaging No results found.   STUDIES:  MR brain 8/12>>> Multiple small areas of acute infarct in the left parietal cortex and left occipital cortex. Recommend CTA head and neck to evaluate for source of emboli or stenosis causing these infarcts. Numerous chronic white matter infarcts bilaterally CTA head 8/16>>> 1. Right M1 embolus with subtotal occlusion but severe stenosis. The anterior temporal branch is covered and non-opacified. 2. Elongated embolus within the left M1 and proximal M2 vessels. 3. Negative for dissection or interval ulceration in the arch and cervical carotids. 4. Carotid bifurcation  atherosclerosis with approximately 50-60% left ICA bulb narrowing. Visually the stenosis appears worse due to positive remodeling at the low-density plaque. 5. Moderate atheromatous narrowing of the right V4 and mid basilar. CT head 8/16>>> 1. Several small foci of enhancement within bilateral parietal and left frontal cortex as well as left basal ganglia probably representing enhancing infarctions. 2. No large hemorrhage, focal mass effect, herniation, or Hydrocephalus. 2D echo 8/14>>>  - Left ventricle: The cavity size was normal. Systolic function was normal. The estimated ejection fraction was in the range of 60% to 65%. Wall motion was normal; there were no regional wall motion abnormalities. Doppler parameters are consistent with abnormal left ventricular relaxation (grade 1 diastolic dysfunction). MRI:  06/04/2017>>> Numerous acute new supra- and infratentorial infarcts spanning multiple vascular territories consistent with embolic phenomena. Nohemorrhagic conversion.Moderate chronic small vessel ischemic disease.  CULTURES: Tracheal aspirate 8/20 >> MSSA  ANTIBIOTICS: cefazolin 8/22 >>   SIGNIFICANT EVENTS: 8/16 - sudden change in mental status, to IR for bilateral embolectomy for revascularization of bilat MCA occlusions   LINES/TUBES: ETT 8/16>>>8/20>>> R rad aline 8/16>>>8/17  DISCUSSION: 66yo female admitted with acute L MCA stroke, then acute R MCA occlusion during diagnostic arteriogram with AMS requiring intubation.  Taken to IR for revascularization. Reintubated on 8/20 for stridor. Left lower lobe atelectasis/infiltrate, MSSA on respiratory culture. Start antibiotics 8/22  ASSESSMENT / PLAN:  PULMONARY Acute respiratory failure - r/t AMS  Failed extubation d/t UAW obstruction/stridor r/t vocal cord edema Decadron started 8/20 pcxr personally reviewed: basilar vol loss atx vs PNA P: Pressure support as tolerated. No cuff leak on 8/22. We will evaluate for  tracheostomy as I believe this will allow her to progress more quickly Stop steroids today Antibiotic's as below    CARDIOVASCULAR Hx HTN  Off Cleviprex 8/17  P:  Continue metoprolol and PRN labetolol Add catapress (was on 0.3 at home) SBP goal 120-140   RENAL At risk for fluid and electrolyte imbalance  P:   Follow BMP, urine output  GASTROINTESTINAL Vomited 8/17 , Coretrack out 8/17  ABD film neg 8/17  Gastroparesis  P:   tubefeeds via cortrak PPI for SUP  HEMATOLOGIC Bilateral MCA occlusion  L ICA stenosis  P:  Follow CBC Hemoglobin goal > 7.0   INFECTIOUS Leukocytosis>> ? Reactive>resolved  MSSA bronchitis versus pneumonia P: Start cefazolin 8/22, PCN allergy but has tolerated cephalosporins in the past    ENDOCRINE DM - Hgb A1c >9 Hypoglycemia  P:   Sliding scale insulin and basal coverage   NEUROLOGIC Bilateral acute MCA occlusion- s/p IR revascularization  Left ICA stenosis P:   RAS goal -1 to  0 Plavix and aspirin Aggressive rehab effort  FAMILY  - Updates: Family updated 8/18  Independent critical care time is 33 minutes.   Baltazar Apo, MD, PhD 06/09/2017, 10:21 AM [facility] [phone] or if no answer [phone]

## 2017-06-09 NOTE — Progress Notes (Signed)
STROKE TEAM PROGRESS NOTE   SUBJECTIVE (INTERVAL HISTORY) Her daughter is  bedside. Pt   presently lightly sedated on ventilator but able to open eyes on voice and follows simple central and peripheral commands.  She failed cuff leak test today and will likely need tracheostomy OBJECTIVE Temp:  [98.4 F (36.9 C)-100 F (37.8 C)] 99.5 F (37.5 C) (08/22 1200) Pulse Rate:  [69-97] 81 (08/22 1200) Cardiac Rhythm: Normal sinus rhythm (08/22 0800) Resp:  [14-28] 16 (08/22 1200) BP: (111-177)/(54-110) 144/89 (08/22 1200) SpO2:  [95 %-100 %] 100 % (08/22 1200) FiO2 (%):  [40 %] 40 % (08/22 1154) Weight:  [168 lb 14 oz (76.6 kg)] 168 lb 14 oz (76.6 kg) (08/22 0500)  CBC:   Recent Labs Lab 06/04/17 0445 06/06/17 0236 06/07/17 0227  WBC 14.7* 10.3 9.6  NEUTROABS 12.7*  --   --   HGB 8.8* 8.9* 8.5*  HCT 27.8* 28.5* 27.6*  MCV 85.5 88.5 87.1  PLT 276 258 195    Basic Metabolic Panel:   Recent Labs Lab 06/04/17 1827  06/06/17 0236 06/07/17 0227 06/08/17 0245  NA  --   < > 138 140 140  K  --   < > 3.6 3.6 3.7  CL  --   < > 108 109 109  CO2  --   < > 22 24 24   GLUCOSE  --   < > 101* 139* 202*  BUN  --   < > 9 7 16   CREATININE  --   < > 0.89 0.83 0.90  CALCIUM  --   < > 7.8* 7.9* 8.4*  MG 1.5*  --  1.6* 2.3  --   PHOS 2.9  --  2.8  --   --   < > = values in this interval not displayed.  Lipid Panel:     Component Value Date/Time   CHOL 297 (H) 06/02/2017 0741   TRIG 224 (H) 06/07/2017 1519   HDL 49 06/02/2017 0741   CHOLHDL 6.1 06/02/2017 0741   VLDL 37 06/02/2017 0741   LDLCALC 211 (H) 06/02/2017 0741   HgbA1c:  Lab Results  Component Value Date   HGBA1C 9.5 (H) 06/02/2017   Urine Drug Screen: No results found for: LABOPIA, COCAINSCRNUR, LABBENZ, AMPHETMU, THCU, LABBARB  Alcohol Level No results found for: [place] I have personally reviewed the radiological images below and agree with the radiology interpretations.  Ct Angio Head and neck W Or Wo  Contrast 05/31/2017 IMPRESSION: 1. No emergent large vessel occlusion. 2. Large amount of noncalcified plaque at the proximal left internal carotid artery, causing 60-65% stenosis. This is the most likely source of emboli for the previously identified left parietal and occipital infarcts. 3. No other intracranial or cervical arterial stenosis. 4. Minimal Aortic Atherosclerosis (ICD10-I70.0).   Mr Jeri Cos Wo Contrast 05/30/2017 IMPRESSION: Multiple small areas of acute infarct in the left parietal cortex and left occipital cortex. Recommend CTA head and neck to evaluate for source of emboli or stenosis causing these infarcts Numerous chronic white matter infarcts bilaterally   CUS 06/01/2017 1-39% right internal carotid artery stenosis and 40-59% left internal carotid artery stenosis. Vertebral arteries are patent with antegrade flow.  TTE 8/114/2018  - Left ventricle: The cavity size was normal. Systolic function was normal. The estimated ejection fraction was in the range of 60% to 65%. Wall motion was normal; there were no regional wall motion abnormalities. Doppler parameters are consistent with abnormal left ventricular relaxation (grade  1 diastolic dysfunction).  Carotid Angiography 06/03/2017 FINDING(S):  Type I aortic arch: widely patent  Innominate artery: widely patent ? R common carotid artery: widely patent ? R internal carotid artery: minimal disease, diffuse atherosclerotic plaque suggested ? R external carotid artery: minimal disease, diffuse atherosclerotic plaque suggested ? R subclavian artery: patent ? R vertebral artery: patent proximally  Left common carotid artery: patent ? L internal carotid artery: patent, 43% stenosis by NASCET proximally ? L external carotid artery: patent  Left subclavian artery: patent ? Left vertebral artery: patent  Intracranial findings to be read by Neuroradiology  CT Head Code Stroke  06/03/2017 IMPRESSION: 1. No acute finding or  change from 3 days ago. 2. 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 Chronic small vessel ischemic changes elsewhere. are stable  CTA Head/Neck 06/03/2017 IMPRESSION: 1. Right M1 embolus with subtotal occlusion but severe stenosis. The anterior temporal branch is covered and non-opacified.  2. Elongated embolus within the left M1 and proximal M2 vessels. 3. Negative for dissection or interval ulceration in the arch and cervical carotids. 4. Carotid bifurcation atherosclerosis with approximately 50-60% left ICA bulb narrowing. Visually the stenosis appears worse due to positive remodeling at the low-density plaque. 5. Moderate atheromatous narrowing of the right V4 and mid basilar.  CT Head WO Contrast 06/03/2017 IMPRESSION: 1. Several small foci of enhancement within bilateral parietal and left frontal cortex as well as left basal ganglia probably representing enhancing infarctions. 2. No large hemorrhage, focal mass effect, herniation, or hydrocephalus.  DG Chest Port 1 View 06/03/2017 IMPRESSION: 1. The ETT terminates 1.5 cm above the carina. Recommend withdrawing 1 cm. 2. New infiltrate in the lateral left lung base is nonspecific.  Recommend attention on follow-up.  DSA 06/03/2017 S/P bilateral common carotid arteriogram,and Rt vert angiogram,followed by complete revascularization of occluded  MCAs M1 segments bilaterally, with x 1 pass with embotrap 64mmx 35 mm  Retrieval device achieving a TICI3 reperfusion bilaterally.  EEG 06/03/2017 This EEG is abnormal and findings are suggestive of mild generalized cerebral dysfunction. Intermittent arm twitching was not associated with any epileptiform activity.  MRI Brain Wo Contrast 06/04/2017 IMPRESSION: 1. 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 No hemorrhagic conversion. 2. Moderate chronic small vessel  ischemic disease.    PHYSICAL EXAM  Temp:  [98.4 F (36.9 C)-100 F (37.8 C)] 99.5 F (37.5 C) (08/22 1200) Pulse Rate:  [69-97] 81 (08/22 1200) Resp:  [14-28] 16 (08/22 1200) BP: (111-177)/(54-110) 144/89 (08/22 1200) SpO2:  [95 %-100 %] 100 % (08/22 1200) FiO2 (%):  [40 %] 40 % (08/22 1154) Weight:  [168 lb 14 oz (76.6 kg)] 168 lb 14 oz (76.6 kg) (08/22 0500)  General - obese, well developed, intubated   Ophthalmologic - Fundi not visualized.  Cardiovascular - Regular rate and rhythm.  Neuro - intubated .Marland Kitchen Eyes closed but open on voice and following simple peripheral and central commands. Eyes moving both direction on commands, but not complete. PERRL, blinking to visual threat bilaterally and tracking. Facial symmetry not able to test due to ET tube. Mild left upper extremity weakness 3/5  . LLE 4/5 at least, RLE 4-/5. DTR 1+ and no babinski. Sensation, coordination and gait not tested.   ASSESSMENT/PLAN Ms. [name] is a 66 y.o. female with history of HTN, HLD, pseudomotor cerebri, DM  presenting with headache and weakness of right hand. She did not receive IV t-PA due  to out of window. CTA head and neck showed left ICA 65% stenosis. Dr. Bridgett Larsson consulted. During cerebral angiogram this morning, patient had acute mental status decline, became unresponsive. Code stroke called.  Found to have M1 thrombus, status post mechanical thrombectomy. MRI 06/04/2017 with numerous acute new supra- and infratentorial infarcts spanning multiple vascular territories consistent with embolic phenomena.   Stroke - bilateral MCA acute thrombosis, likely related to cerebral angiogram procedure treated with bilateral mechanical embolectomy with embotrap device with excellent revascularization  Acute mental status decline during procedure  CT negative for acute hemorrhage or infarct  CTA head and neck showed bilateral MCA acute thrombosis  S/p mechanical thrombectomy with bilateral TICI3  reperfusion  EEG with no seizure activity  MRI showed bilateral supra-and infratentorial infarcts, consistent with cardioembolic phenomena  Continue DAPT and supportive care   Clinically much improved  Wean off vent as able  Recent stroke: acute and subacute small pathcy infarcts in the left MCA distribution, possibly artery to artery embolic due to left ICA stenosis.     Resultant  Right hand subtle weakness  MRI head Multiple small areas of acute infarct in the left parietal cortex and left occipital cortex. Numerous chronic white matter infarcts bilaterally  CTA head and neck - Large amount of noncalcified plaque at the proximal left internal carotid artery, causing 60-65% stenosis.   2D Echo  EF 60-65%  CUS - left ICA 40-59% stenosis  Cerebral angio < 50% left ICA stenosis  LDL 211  HgbA1c 9.5  SCDs for VTE prophylaxis Diet NPO time specified  aspirin 81 mg daily prior to admission, now on aspirin 325 mg daily and clopidogrel 75 mg daily.    Patient counseled to be compliant with her antithrombotic medications  Ongoing aggressive stroke risk factor management  Therapy recommendations: None  Disposition:  Pending  Left ICA stenosis  CTA neck - left ICA 60-65%  CUS - left ICA 40-59%  DSA: left ICA < 50% stenosis  No intervention needed at this time  Continue follow-up as outpatient  Respiratory failure  Intubated  On ventilation  Failed weaning trial 06/06/17  CCM on board  Hypertension  Stable Off clevidipine SBP < 160 mmHg Long-term BP goal normotensive  Hyperlipidemia  Home meds:  none  LDL 211, goal < 70  Patient not tolerating statins. self-reports leg numbness, chest pain, severe headaches with statins on multiple occassions with multiple providers  Recommend follow up with PCP Dr. Felipa Eth for consideration of PCSK9 inhibitor. Medication name given to pt.  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 Jaynee Eagles at [facility] day # 9 Plan : Continue ventilatory support and short course of steroids and elective tracheostomy tomorrow as per P CCM.Marland Kitchen Discussed with patient's daughter and Dr. Lamonte Sakai and answered questions. This patient is critically ill due to code stroke status, acute mental status change, bilateral MCA thrombosis status post thrombectomy and at significant risk of neurological worsening, death form recurrent stroke, hemorrhagic transformation, seizure, brain edema, cerebral herniation. This patient's care requires constant monitoring of vital signs, hemodynamics, respiratory and cardiac monitoring, review of multiple databases, neurological assessment, discussion with family, other specialists and medical decision  making of high complexity. I spent 30 minutes of neurocritical care time in the care of this patient.   Antony Contras, MD Stroke Neurology 06/09/2017 1:07 PM   To contact Stroke Continuity provider, please refer to [email or web address] After hours, contact General Neurology

## 2017-06-09 NOTE — Progress Notes (Signed)
eLink Physician-Brief Progress Note Patient Name: [name] DOB: Feb 17, 1951 MRN: [id number]   Date of Service  06/09/2017  HPI/Events of Note  Runs of v tach  eICU Interventions  Check BMP and MG levels     Intervention Category Evaluation Type: Other  Flora Lipps 06/09/2017, 10:35 PM

## 2017-06-09 NOTE — Progress Notes (Signed)
Occupational Therapy Treatment Patient Details Name: [name] MRN: [id number] DOB: [birth year] Today's Date: 06/09/2017    History of present illness Pt is a 66 y/o female with hx HTN, DM. She presented initially 8/13 to her PCP with headache, R hight weakness and vision changes. She was sent for MRI which revealed multiple areas of acute L MCA infarct. She was sent to ER and admitted for CVA workup. On 8/16 pt was in cath lab for diagnostic carotid and cerebral angiogram with vascular surgery when she became suddenly unresponsive with L sided weakness and possible L sided seizure-like activity. CTA revealed bilateral M1 occlusions. She was intubated and taken urgently to IR for revascularization and rapid resolution of embolism.   OT comments  Pt tolerated sitting EOB x7 minutes with max-total assist for support; pt with posterior and L lateral pushing at times. Pt tolerated AA/PROM bil UEs x5 shoulder flex/ext, elbow flex/ext. D/c plan remains appropriate. Will continue to follow acutely.   Follow Up Recommendations  CIR;Supervision/Assistance - 24 hour    Equipment Recommendations  Other (comment) (TBD at next venue)    Recommendations for Other Services      Precautions / Restrictions Precautions Precautions: Fall Precaution Comments: NG tube, ET tube Restrictions Weight Bearing Restrictions: No       Mobility Bed Mobility Overal bed mobility: Needs Assistance Bed Mobility: Supine to Sit;Sit to Supine     Supine to sit: Total assist;+2 for physical assistance Sit to supine: Total assist;+2 for physical assistance   General bed mobility comments: Total assist +2 for all aspects of bed mobility  Transfers                      Balance Overall balance assessment: Needs assistance Sitting-balance support: Feet supported;Bilateral upper extremity supported Sitting balance-Leahy Scale: Zero Sitting balance - Comments: Max assist to maintain sitting balance  x7 minutes. Posterior and L lateral pushing at times                                   ADL either performed or assessed with clinical judgement   ADL Overall ADL's : Needs assistance/impaired                                       General ADL Comments: Pt tolerated sitting EOB x7 minutes with max posterior support. VSS thoughout.     Vision       Perception     Praxis      Cognition Arousal/Alertness: Lethargic Behavior During Therapy: Flat affect Overall Cognitive Status: Difficult to assess                                 General Comments: Pt intermittently following one step commands appropriately. Shakes head "no" to most questions.        Exercises Exercises: General Upper Extremity General Exercises - Upper Extremity Shoulder Flexion: PROM;Both;5 reps;Supine Elbow Flexion: PROM;Both;5 reps;Supine   Shoulder Instructions       General Comments      Pertinent Vitals/ Pain       Pain Assessment: Faces Faces Pain Scale: Hurts little more Pain Location: general grimacing with movement Pain Descriptors / Indicators: Discomfort;Grimacing Pain Intervention(s): Monitored during session;Repositioned  Home Living  Prior Functioning/Environment              Frequency  Min 2X/week        Progress Toward Goals  OT Goals(current goals can now be found in the care plan section)  Progress towards OT goals: Progressing toward goals  Acute Rehab OT Goals Patient Stated Goal: none stated OT Goal Formulation: With patient  Plan Discharge plan remains appropriate    Co-evaluation    PT/OT/SLP Co-Evaluation/Treatment: Yes Reason for Co-Treatment: Complexity of the patient's impairments (multi-system involvement);For patient/therapist safety   OT goals addressed during session: Strengthening/ROM      AM-PAC PT "6 Clicks" Daily Activity     Outcome  Measure   Help from another person eating meals?: Total Help from another person taking care of personal grooming?: Total Help from another person toileting, which includes using toliet, bedpan, or urinal?: Total Help from another person bathing (including washing, rinsing, drying)?: Total Help from another person to put on and taking off regular upper body clothing?: Total Help from another person to put on and taking off regular lower body clothing?: Total 6 Click Score: 6    End of Session    OT Visit Diagnosis: Muscle weakness (generalized) (M62.81)   Activity Tolerance Patient tolerated treatment well   Patient Left in bed;with call bell/phone within reach   Nurse Communication Mobility status;Other (comment) (check ET tube)        Time: 9242-6834 OT Time Calculation (min): 25 min  Charges: OT General Charges $OT Visit: 1 Procedure OT Treatments $Therapeutic Activity: 8-22 mins  Eduardo Wurth A. Ulice Brilliant, M.S., OTR/L Pager: [place] 06/09/2017, 2:05 PM

## 2017-06-10 ENCOUNTER — Inpatient Hospital Stay (HOSPITAL_COMMUNITY): Payer: Medicare Other

## 2017-06-10 DIAGNOSIS — Z930 Tracheostomy status: Secondary | ICD-10-CM

## 2017-06-10 LAB — PROTIME-INR
INR: 0.99
Prothrombin Time: 13.1 seconds (ref 11.4–15.2)

## 2017-06-10 LAB — CBC
HCT: 27.2 % — ABNORMAL LOW (ref 36.0–46.0)
HEMOGLOBIN: 8.9 g/dL — AB (ref 12.0–15.0)
MCH: 28.9 pg (ref 26.0–34.0)
MCHC: 32.7 g/dL (ref 30.0–36.0)
MCV: 88.3 fL (ref 78.0–100.0)
PLATELETS: 362 10*3/uL (ref 150–400)
RBC: 3.08 MIL/uL — AB (ref 3.87–5.11)
RDW: 14.9 % (ref 11.5–15.5)
WBC: 15.3 10*3/uL — ABNORMAL HIGH (ref 4.0–10.5)

## 2017-06-10 LAB — GLUCOSE, CAPILLARY
GLUCOSE-CAPILLARY: 58 mg/dL — AB (ref 65–99)
GLUCOSE-CAPILLARY: 101 mg/dL — AB (ref 65–99)
GLUCOSE-CAPILLARY: 275 mg/dL — AB (ref 65–99)
Glucose-Capillary: 182 mg/dL — ABNORMAL HIGH (ref 65–99)
Glucose-Capillary: 156 mg/dL — ABNORMAL HIGH (ref 65–99)
Glucose-Capillary: 213 mg/dL — ABNORMAL HIGH (ref 65–99)
Glucose-Capillary: 145 mg/dL — ABNORMAL HIGH (ref 65–99)

## 2017-06-10 LAB — BASIC METABOLIC PANEL
ANION GAP: 9 (ref 5–15)
Anion gap: 7 (ref 5–15)
BUN: 29 mg/dL — ABNORMAL HIGH (ref 6–20)
BUN: 31 mg/dL — ABNORMAL HIGH (ref 6–20)
CALCIUM: 8.4 mg/dL — AB (ref 8.9–10.3)
CO2: 25 mmol/L (ref 22–32)
CO2: 26 mmol/L (ref 22–32)
CREATININE: 0.85 mg/dL (ref 0.44–1.00)
Calcium: 8.2 mg/dL — ABNORMAL LOW (ref 8.9–10.3)
Chloride: 107 mmol/L (ref 101–111)
Chloride: 108 mmol/L (ref 101–111)
Creatinine, Ser: 0.86 mg/dL (ref 0.44–1.00)
GFR calc Af Amer: >60 mL/min (ref >60)
GFR calc Af Amer: >60 mL/min (ref >60)
GFR calc non Af Amer: >60 mL/min (ref >60)
GLUCOSE: 177 mg/dL — AB (ref 65–99)
Glucose, Bld: 152 mg/dL — ABNORMAL HIGH (ref 65–99)
Potassium: 3.2 mmol/L — ABNORMAL LOW (ref 3.5–5.1)
Potassium: 3.7 mmol/L (ref 3.5–5.1)
Sodium: 140 mmol/L (ref 135–145)
Sodium: 142 mmol/L (ref 135–145)

## 2017-06-10 LAB — MAGNESIUM
Magnesium: 1.8 mg/dL (ref 1.7–2.4)
Magnesium: 1.8 mg/dL (ref 1.7–2.4)

## 2017-06-10 LAB — TRIGLYCERIDES: TRIGLYCERIDES: 220 mg/dL — AB (ref <150)

## 2017-06-10 MED ORDER — FUROSEMIDE 10 MG/ML IJ SOLN
40.0000 mg | Freq: Two times a day (BID) | INTRAMUSCULAR | Status: AC
  Administered 2017-06-10 (×2): 40 mg via INTRAVENOUS
  Filled 2017-06-10 (×3): qty 4

## 2017-06-10 MED ORDER — POTASSIUM CHLORIDE 20 MEQ/15ML (10%) PO SOLN
40.0000 meq | Freq: Once | ORAL | Status: AC
  Administered 2017-06-10: 40 meq
  Filled 2017-06-10: qty 30

## 2017-06-10 MED ORDER — CHLORHEXIDINE GLUCONATE 0.12% ORAL RINSE (MEDLINE KIT)
15.0000 mL | Freq: Two times a day (BID) | OROMUCOSAL | Status: DC
  Administered 2017-06-11 – 2017-06-15 (×8): 15 mL via OROMUCOSAL

## 2017-06-10 MED ORDER — DEXTROSE 50 % IV SOLN
INTRAVENOUS | Status: AC
  Administered 2017-06-10: 25 mL
  Filled 2017-06-10: qty 50

## 2017-06-10 MED ORDER — ORAL CARE MOUTH RINSE
15.0000 mL | Freq: Four times a day (QID) | OROMUCOSAL | Status: DC
  Administered 2017-06-11 – 2017-06-17 (×18): 15 mL via OROMUCOSAL

## 2017-06-10 NOTE — Procedures (Signed)
Bronchoscopy Procedure Note TKEYA STENCIL [id number] 1951/10/10  Procedure: Bronchoscopy Indications: trach placement   Procedure Details Consent: Risks of procedure as well as the alternatives and risks of each were explained to the (patient/caregiver).  Consent for procedure obtained. Time Out: Verified patient identification, verified procedure, site/side was marked, verified correct patient position, special equipment/implants available, medications/allergies/relevent history reviewed, required imaging and test results available.  Performed  In preparation for procedure, patient was given 100% FiO2. Sedation: Muscle relaxants and Etomidate  Procedure done by P Babcock ACNP-BC, under direct supervision of Dr Lamonte Sakai. At first bronch was introduce through ET tube and structures of tracheal rings, carina identified for operator of tracheostomy who was Dr Nelda Marseille . Light of bronch passed through trachea and skin for indentification of tracheal rings for tracheostomy puncture. After this, under bronchoscopy guidance,  ET tube was pulled back sufficiently and very carefully. The ET tube was  pulled back enough to give room for tracheostomy operator and yet at same time to to ensure a secured airway. After this was accomplished, bronchoscope was withdrawn into the ET tube. After this,  Dr Gwen Pounds then performed tracheostomy under video visual provided by flexible video bronchoscopy. Followng introduction of tracheostomy,  the bronchoscope was removed from ET tube and introduced through tracheostomy. Correct position of tracheostomy was ensured, with enough room between carina and distal tracheostomy and no evidence of bleeding. The bronchoscope was then withdrawn. Respiratory therapist was then instructed to remove the ET tube.  Dr Nelda Marseille  then proceeded to complete the tracheostomy with stay sutures   No complications  Erick Colace ACNP-BC [place] Pager # [phone] OR #  [phone] if no answer   Clementeen Graham 06/10/2017

## 2017-06-10 NOTE — Procedures (Signed)
Bedside Tracheostomy Insertion Procedure Note   Patient Details:   Name: [name] DOB: 17-Dec-1950 MRN: [id number]  Procedure: Tracheostomy  Pre Procedure Assessment: ET Tube Size:7.5 ET Tube secured at lip (cm):19 Bite block in place: No Breath Sounds: Clear and Diminished  Post Procedure Assessment: BP (!) 143/69   Pulse 75   Temp 98.4 F (36.9 C) (Axillary)   Resp 16   Ht 5\' 4"  (1.626 m)   Wt 167 lb 5.3 oz (75.9 kg)   SpO2 100%   BMI 28.72 kg/m  O2 sats: stable throughout Complications: No apparent complications Patient did tolerate procedure well Tracheostomy Brand:Shiley Tracheostomy Style:Cuffed Tracheostomy Size: 6.0 Tracheostomy Secured QVO:HCSPZZ and sutures Tracheostomy Placement Confirmation:Trach cuff visualized and in place and Chest X ray ordered for placement    Cherre Huger A 05/20/2178, 81:02 AM

## 2017-06-10 NOTE — Progress Notes (Signed)
PULMONARY / CRITICAL CARE MEDICINE   Name: [name] MRN: [id number] DOB: 08/02/1951    ADMISSION DATE:  05/31/2017 CONSULTATION DATE:  8/16  REFERRING MD:  Erlinda Hong   CHIEF COMPLAINT:  Vent management   HISTORY OF PRESENT ILLNESS:   66yo female with hx HTN, DM who presented initially 8/13 to her PCP with headache, R sided weakness and vision changes.  She was sent for MRI which revealed multiple areas of acute L MCA infarct.  She was sent to ER and admitted for CVA workup. On 8/16 pt was in cath lab for diagnostic carotid and cerebral angiogram with vascular surgery when she became suddenly unresponsive with L sided weakness and possible L sided seizure-like activity.  CTA revealed bilateral M1 occlusions.  She was intubated and taken urgently to IR for revascularization and rapid resolution of embolism.  PCCM consulted for ICU/vent management post procedure.   SUBJECTIVE:  More sedated today, propofol running Preparing for tracheostomy placement given her upper airway edema, vocal cord edema  VITAL SIGNS: BP (!) 143/69   Pulse 75   Temp 98.4 F (36.9 C) (Axillary)   Resp 16   Ht 5\' 4"  (1.626 m)   Wt 75.9 kg (167 lb 5.3 oz)   SpO2 100%   BMI 28.72 kg/m   HEMODYNAMICS:    VENTILATOR SETTINGS: Vent Mode: PRVC FiO2 (%):  [40 %] 40 % Set Rate:  [16 bmp] 16 bmp Vt Set:  [440 mL] 440 mL PEEP:  [5 cmH20] 5 cmH20 Plateau Pressure:  [11 cmH20-17 cmH20] 11 cmH20  INTAKE / OUTPUT:  Intake/Output Summary (Last 24 hours) at 06/10/17 0931 Last data filed at 06/10/17 0900  Gross per 24 hour  Intake           1127.4 ml  Output             1500 ml  Net           -372.6 ml   General appearance:  Obese, ventilated, no distress noted Eyes: Pupils equal, eye movements normal Mouth:  ET tube in place, oropharynx clear Lungs/chest: On control mode ventilation, no crackles, no wheezes CV: Regular, no murmur Abdomen: Soft, nontender, positive bowel sounds Extremities: No significant  edema Skin: No rash Neuro: Wakes with vigorous stimulation, medially back to sleep, does not follow commands today (on sedation)   LABS:  BMET  Recent Labs Lab 06/08/17 0245 06/09/17 2346 06/10/17 0630  NA 140 142 140  K 3.7 3.2* 3.7  CL 109 108 107  CO2 24 25 26   BUN 16 31* 29*  CREATININE 0.90 0.85 0.86  GLUCOSE 202* 177* 152*    Electrolytes  Recent Labs Lab 06/04/17 0445 06/04/17 1827  06/06/17 0236 06/07/17 0227 06/08/17 0245 06/09/17 2346 06/10/17 0630  CALCIUM 7.6*  --   < > 7.8* 7.9* 8.4* 8.4* 8.2*  MG 1.5* 1.5*  --  1.6* 2.3  --  1.8 1.8  PHOS 3.6 2.9  --  2.8  --   --   --   --   < > = values in this interval not displayed.  CBC  Recent Labs Lab 06/06/17 0236 06/07/17 0227 06/10/17 0630  WBC 10.3 9.6 15.3*  HGB 8.9* 8.5* 8.9*  HCT 28.5* 27.6* 27.2*  PLT 258 300 362    Coag's  Recent Labs Lab 06/10/17 0630 06/10/17 0808  INR QUESTIONABLE RESULTS, RECOMMEND RECOLLECT TO VERIFY 0.99    Sepsis Markers No results for input(s): LATICACIDVEN, PROCALCITON, O2SATVEN in  the last 168 hours.  ABG  Recent Labs Lab 06/03/17 1417 06/07/17 1542  PHART 7.431 7.421  PCO2ART 31.0* 43.7  PO2ART 206* 393.0*    Liver Enzymes No results for input(s): AST, ALT, ALKPHOS, BILITOT, ALBUMIN in the last 168 hours.  Cardiac Enzymes No results for input(s): TROPONINI, PROBNP in the last 168 hours.  Glucose  Recent Labs Lab 06/09/17 1528 06/09/17 2103 06/09/17 2340 06/10/17 0449 06/10/17 0527 06/10/17 0750  GLUCAP 174* 212* 199* 58* 182* 156*    Imaging No results found.   STUDIES:  MR brain 8/12>>> Multiple small areas of acute infarct in the left parietal cortex and left occipital cortex. Recommend CTA head and neck to evaluate for source of emboli or stenosis causing these infarcts. Numerous chronic white matter infarcts bilaterally CTA head 8/16>>> 1. Right M1 embolus with subtotal occlusion but severe stenosis. The anterior  temporal branch is covered and non-opacified. 2. Elongated embolus within the left M1 and proximal M2 vessels. 3. Negative for dissection or interval ulceration in the arch and cervical carotids. 4. Carotid bifurcation atherosclerosis with approximately 50-60% left ICA bulb narrowing. Visually the stenosis appears worse due to positive remodeling at the low-density plaque. 5. Moderate atheromatous narrowing of the right V4 and mid basilar. CT head 8/16>>> 1. Several small foci of enhancement within bilateral parietal and left frontal cortex as well as left basal ganglia probably representing enhancing infarctions. 2. No large hemorrhage, focal mass effect, herniation, or Hydrocephalus. 2D echo 8/14>>>  - Left ventricle: The cavity size was normal. Systolic function was normal. The estimated ejection fraction was in the range of 60% to 65%. Wall motion was normal; there were no regional wall motion abnormalities. Doppler parameters are consistent with abnormal left ventricular relaxation (grade 1 diastolic dysfunction). MRI:  06/04/2017>>> Numerous acute new supra- and infratentorial infarcts spanning multiple vascular territories consistent with embolic phenomena. Nohemorrhagic conversion.Moderate chronic small vessel ischemic disease.  CULTURES: Tracheal aspirate 8/20 >> MSSA  ANTIBIOTICS: cefazolin 8/22 >>   SIGNIFICANT EVENTS: 8/16 - sudden change in mental status, to IR for bilateral embolectomy for revascularization of bilat MCA occlusions   LINES/TUBES: ETT 8/16>>>8/20>>> R rad aline 8/16>>>8/17  DISCUSSION: 66yo female admitted with acute L MCA stroke, then acute R MCA occlusion during diagnostic arteriogram with AMS requiring intubation.  Taken to IR for revascularization. Reintubated on 8/20 for stridor. Left lower lobe atelectasis/infiltrate, MSSA on respiratory culture. Start antibiotics 8/22  ASSESSMENT / PLAN:  PULMONARY Acute respiratory failure - r/t AMS  Stridor,  vocal cord edema P: Planning for tracheostomy today 8/23 Antibiotics as below Push for pressure support and trach collar once stable post procedure  CARDIOVASCULAR Hx HTN  Off Cleviprex 8/17  P:  Schedule metoprolol Scheduled Catapres Labetalol as needed SBP goal 120-140  RENAL At risk for fluid and electrolyte imbalance  P:   Follow BMP, urine output Gentle diuresis, 6 L positive for hospitalization  GASTROINTESTINAL Vomited 8/17 , Coretrack out 8/17  ABD film neg 8/17  Gastroparesis  P:   Tube feeds via cortrac PPI for SUP Speech therapy evaluation when she is able to achieve trach collar  HEMATOLOGIC Bilateral MCA occlusion  L ICA stenosis  P:  Follow CBC Hemoglobin goal > 7.0   INFECTIOUS Leukocytosis>> ? Reactive>resolved  MSSA bronchitis versus pneumonia P: Continue cefazolin, PCN allergy but has tolerated cephalosporins in the past    ENDOCRINE DM - Hgb A1c >9 Hypoglycemia  P:   Sliding-scale insulin and basal coverage   NEUROLOGIC  Bilateral acute MCA occlusion- s/p IR revascularization  Left ICA stenosis P:   RAS goal -1 to 0 Plavix and aspirin Minimize sedating medications as possible, likely DC propofol once tracheostomy performed   FAMILY  - Updates: Family updated 8/23  Independent critical care time is 32 minutes.   Baltazar Apo, MD, PhD 06/10/2017, 9:31 AM [facility] [phone] or if no answer [phone]

## 2017-06-10 NOTE — Progress Notes (Signed)
eLink Physician-Brief Progress Note Patient Name: [name] DOB: [birth year] MRN: [id number]   Date of Service  06/10/2017  HPI/Events of Note  K+ = 3.2  and Creatinine = 0.85.  eICU Interventions  Will replace K+.     Intervention Category Major Interventions: Electrolyte abnormality - evaluation and management  [name] 06/10/2017, 1:12 AM

## 2017-06-10 NOTE — Procedures (Signed)
Percutaneous Tracheostomy Placement  Consent from family.  Patient sedated, paralyzed and position.  Placed on 100% FiO2 and RR matched.  Area cleaned and draped.  Lidocaine/epi injected.  Skin incision done followed by blunt dissection.  Trachea palpated then punctured, catheter passed and visualized bronchoscopically.  Wire placed and visualized.  Catheter removed.  Airway then entered and dilated.  Size 6 cuffed shiley trach placed and visualized bronchoscopically well above carina.  Good volume returns.  Patient tolerated the procedure well without complications.  Minimal blood loss.  CXR ordered and pending.  Wesam G. Yacoub, M.D. [facility]. Pager: [phone]. After hours pager: [phone].  

## 2017-06-10 NOTE — Plan of Care (Signed)
Problem: Coping: Goal: Ability to verbalize positive feelings about self will improve Outcome: Not Progressing Pt unable to communicate with staff regarding self improvement.

## 2017-06-10 NOTE — Progress Notes (Signed)
STROKE TEAM PROGRESS NOTE   SUBJECTIVE (INTERVAL HISTORY) Her daughter is at the  bedside. Pt   presently lightly sedated on ventilator but able to open eyes on voice and follows simple central and peripheral commands.  She is scheduled for  Tracheostomy today OBJECTIVE Temp:  [98.2 F (36.8 C)-99.7 F (37.6 C)] 98.5 F (36.9 C) (08/23 1133) Pulse Rate:  [72-98] 98 (08/23 1214) Cardiac Rhythm: Normal sinus rhythm (08/23 0800) Resp:  [14-23] 16 (08/23 1045) BP: (90-180)/(57-91) 146/83 (08/23 1214) SpO2:  [100 %] 100 % (08/23 1045) FiO2 (%):  [40 %] 40 % (08/23 1204) Weight:  [167 lb 5.3 oz (75.9 kg)] 167 lb 5.3 oz (75.9 kg) (08/23 0451)  CBC:   Recent Labs Lab 06/04/17 0445  06/07/17 0227 06/10/17 0630  WBC 14.7*  < > 9.6 15.3*  NEUTROABS 12.7*  --   --   --   HGB 8.8*  < > 8.5* 8.9*  HCT 27.8*  < > 27.6* 27.2*  MCV 85.5  < > 87.1 88.3  PLT 276  < > 300 362  < > = values in this interval not displayed.  Basic Metabolic Panel:   Recent Labs Lab 06/04/17 1827  06/06/17 0236  06/09/17 2346 06/10/17 0630  NA  --   < > 138  < > 142 140  K  --   < > 3.6  < > 3.2* 3.7  CL  --   < > 108  < > 108 107  CO2  --   < > 22  < > 25 26  GLUCOSE  --   < > 101*  < > 177* 152*  BUN  --   < > 9  < > 31* 29*  CREATININE  --   < > 0.89  < > 0.85 0.86  CALCIUM  --   < > 7.8*  < > 8.4* 8.2*  MG 1.5*  --  1.6*  < > 1.8 1.8  PHOS 2.9  --  2.8  --   --   --   < > = values in this interval not displayed.  Lipid Panel:     Component Value Date/Time   CHOL 297 (H) 06/02/2017 0741   TRIG 224 (H) 06/07/2017 1519   HDL 49 06/02/2017 0741   CHOLHDL 6.1 06/02/2017 0741   VLDL 37 06/02/2017 0741   LDLCALC 211 (H) 06/02/2017 0741   HgbA1c:  Lab Results  Component Value Date   HGBA1C 9.5 (H) 06/02/2017   Urine Drug Screen: No results found for: LABOPIA, COCAINSCRNUR, LABBENZ, AMPHETMU, THCU, LABBARB  Alcohol Level No results found for: [place] I have personally reviewed the  radiological images below and agree with the radiology interpretations.  Ct Angio Head and neck W Or Wo Contrast 05/31/2017 IMPRESSION: 1. No emergent large vessel occlusion. 2. Large amount of noncalcified plaque at the proximal left internal carotid artery, causing 60-65% stenosis. This is the most likely source of emboli for the previously identified left parietal and occipital infarcts. 3. No other intracranial or cervical arterial stenosis. 4. Minimal Aortic Atherosclerosis (ICD10-I70.0).   Mr Jeri Cos Wo Contrast 05/30/2017 IMPRESSION: Multiple small areas of acute infarct in the left parietal cortex and left occipital cortex. Recommend CTA head and neck to evaluate for source of emboli or stenosis causing these infarcts Numerous chronic white matter infarcts bilaterally   CUS 06/01/2017 1-39% right internal carotid artery stenosis and 40-59% left internal carotid artery stenosis. Vertebral arteries are  patent with antegrade flow.  TTE 8/114/2018  - Left ventricle: The cavity size was normal. Systolic function was normal. The estimated ejection fraction was in the range of 60% to 65%. Wall motion was normal; there were no regional wall motion abnormalities. Doppler parameters are consistent with abnormal left ventricular relaxation (grade 1 diastolic dysfunction).  Carotid Angiography 06/03/2017 FINDING(S):  Type I aortic arch: widely patent  Innominate artery: widely patent ? R common carotid artery: widely patent ? R internal carotid artery: minimal disease, diffuse atherosclerotic plaque suggested ? R external carotid artery: minimal disease, diffuse atherosclerotic plaque suggested ? R subclavian artery: patent ? R vertebral artery: patent proximally  Left common carotid artery: patent ? L internal carotid artery: patent, 43% stenosis by NASCET proximally ? L external carotid artery: patent  Left subclavian artery: patent ? Left vertebral artery: patent  Intracranial  findings to be read by Neuroradiology  CT Head Code Stroke  06/03/2017 IMPRESSION: 1. No acute finding or change from 3 days ago. 2. 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 Chronic small vessel ischemic changes elsewhere. are stable  CTA Head/Neck 06/03/2017 IMPRESSION: 1. Right M1 embolus with subtotal occlusion but severe stenosis. The anterior temporal branch is covered and non-opacified.  2. Elongated embolus within the left M1 and proximal M2 vessels. 3. Negative for dissection or interval ulceration in the arch and cervical carotids. 4. Carotid bifurcation atherosclerosis with approximately 50-60% left ICA bulb narrowing. Visually the stenosis appears worse due to positive remodeling at the low-density plaque. 5. Moderate atheromatous narrowing of the right V4 and mid basilar.  CT Head WO Contrast 06/03/2017 IMPRESSION: 1. Several small foci of enhancement within bilateral parietal and left frontal cortex as well as left basal ganglia probably representing enhancing infarctions. 2. No large hemorrhage, focal mass effect, herniation, or hydrocephalus.  DG Chest Port 1 View 06/03/2017 IMPRESSION: 1. The ETT terminates 1.5 cm above the carina. Recommend withdrawing 1 cm. 2. New infiltrate in the lateral left lung base is nonspecific.  Recommend attention on follow-up.  DSA 06/03/2017 S/P bilateral common carotid arteriogram,and Rt vert angiogram,followed by complete revascularization of occluded  MCAs M1 segments bilaterally, with x 1 pass with embotrap 10mmx 35 mm  Retrieval device achieving a TICI3 reperfusion bilaterally.  EEG 06/03/2017 This EEG is abnormal and findings are suggestive of mild generalized cerebral dysfunction. Intermittent arm twitching was not associated with any epileptiform activity.  MRI Brain Wo Contrast 06/04/2017 IMPRESSION: 1. Numerous acute new supra- and infratentorial infarcts spanning multiple vascular  territories consistent with embolic phenomena. No hemorrhagic conversion. 2. Moderate chronic small vessel ischemic disease.    PHYSICAL EXAM  Temp:  [98.2 F (36.8 C)-99.7 F (37.6 C)] 98.5 F (36.9 C) (08/23 1133) Pulse Rate:  [72-98] 98 (08/23 1214) Resp:  [14-23] 16 (08/23 1045) BP: (90-180)/(57-91) 146/83 (08/23 1214) SpO2:  [100 %] 100 % (08/23 1045) FiO2 (%):  [40 %] 40 % (08/23 1204) Weight:  [167 lb 5.3 oz (75.9 kg)] 167 lb 5.3 oz (75.9 kg) (08/23 0451)  General - obese, well developed, intubated   Ophthalmologic - Fundi not visualized.  Cardiovascular - Regular rate and rhythm.  Neuro - intubated .Marland Kitchen Eyes closed but open on voice and following simple peripheral and central commands. Eyes moving both direction on commands, but not complete. PERRL, blinking to visual threat bilaterally and tracking. Facial symmetry not able to test due to ET tube. Mild left upper extremity weakness 3/5  . LLE  4/5 at least, RLE 4-/5. DTR 1+ and no babinski. Sensation, coordination and gait not tested.   ASSESSMENT/PLAN Ms. [name] is a 66 y.o. female with history of HTN, HLD, pseudomotor cerebri, DM  presenting with headache and weakness of right hand. She did not receive IV t-PA due to out of window. CTA head and neck showed left ICA 65% stenosis. Dr. Bridgett Larsson consulted. During cerebral angiogram this morning, patient had acute mental status decline, became unresponsive. Code stroke called.  Found to have M1 thrombus, status post mechanical thrombectomy. MRI 06/04/2017 with numerous acute new supra- and infratentorial infarcts spanning multiple vascular territories consistent with embolic phenomena.   Stroke - bilateral MCA acute thrombosis, likely related to cerebral angiogram procedure treated with bilateral mechanical embolectomy with embotrap device with excellent revascularization  Acute mental status decline during procedure  CT negative for acute hemorrhage or infarct  CTA head  and neck showed bilateral MCA acute thrombosis  S/p mechanical thrombectomy with bilateral TICI3 reperfusion  EEG with no seizure activity  MRI showed bilateral supra-and infratentorial infarcts, consistent with cardioembolic phenomena  Continue DAPT and supportive care   Clinically much improved  Wean off vent as able  Recent stroke: acute and subacute small pathcy infarcts in the left MCA distribution, possibly artery to artery embolic due to left ICA stenosis.     Resultant  Right hand subtle weakness  MRI head Multiple small areas of acute infarct in the left parietal cortex and left occipital cortex. Numerous chronic white matter infarcts bilaterally  CTA head and neck - Large amount of noncalcified plaque at the proximal left internal carotid artery, causing 60-65% stenosis.   2D Echo  EF 60-65%  CUS - left ICA 40-59% stenosis  Cerebral angio < 50% left ICA stenosis  LDL 211  HgbA1c 9.5  SCDs for VTE prophylaxis Diet NPO time specified  aspirin 81 mg daily prior to admission, now on aspirin 325 mg daily and clopidogrel 75 mg daily.    Patient counseled to be compliant with her antithrombotic medications  Ongoing aggressive stroke risk factor management  Therapy recommendations: None  Disposition:  Pending  Left ICA stenosis  CTA neck - left ICA 60-65%  CUS - left ICA 40-59%  DSA: left ICA < 50% stenosis  No intervention needed at this time  Continue follow-up as outpatient  Respiratory failure  Intubated  On ventilation  Failed weaning trial 06/06/17  CCM on board  Hypertension  Stable Off clevidipine SBP < 160 mmHg Long-term BP goal normotensive  Hyperlipidemia  Home meds:  none  LDL 211, goal < 70  Patient not tolerating statins. self-reports leg numbness, chest pain, severe headaches with statins on multiple occassions with multiple providers  Recommend follow up with PCP Dr. Felipa Eth for consideration of PCSK9 inhibitor.  Medication name given to pt.  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 Jaynee Eagles at [facility] day # 10 Plan : Continue ventilatory support and   elective tracheostomy today as per P CCM.Marland Kitchen Discussed with patient's daughter and Dr. Lamonte Sakai and answered questions. This patient is critically ill due to code stroke status, acute mental status change, bilateral MCA thrombosis  status post thrombectomy and at significant risk of neurological worsening, death form recurrent stroke, hemorrhagic transformation, seizure, brain edema, cerebral herniation. This patient's care requires constant monitoring of vital signs, hemodynamics, respiratory and cardiac monitoring, review of multiple databases, neurological assessment, discussion with family, other specialists and medical decision making of high complexity. I spent 30 minutes of neurocritical care time in the care of this patient.   Antony Contras, MD Stroke Neurology 06/10/2017 12:31 PM   To contact Stroke Continuity provider, please refer to [email or web address] After hours, contact General Neurology

## 2017-06-11 LAB — GLUCOSE, CAPILLARY
GLUCOSE-CAPILLARY: 87 mg/dL (ref 65–99)
Glucose-Capillary: 147 mg/dL — ABNORMAL HIGH (ref 65–99)
Glucose-Capillary: 199 mg/dL — ABNORMAL HIGH (ref 65–99)
Glucose-Capillary: 207 mg/dL — ABNORMAL HIGH (ref 65–99)
Glucose-Capillary: 243 mg/dL — ABNORMAL HIGH (ref 65–99)
Glucose-Capillary: 243 mg/dL — ABNORMAL HIGH (ref 65–99)
Glucose-Capillary: 250 mg/dL — ABNORMAL HIGH (ref 65–99)

## 2017-06-11 MED ORDER — PANCRELIPASE (LIP-PROT-AMYL) 12000-38000 UNITS PO CPEP
24000.0000 [IU] | ORAL_CAPSULE | Freq: Once | ORAL | Status: AC
  Administered 2017-06-11: 24000 [IU] via ORAL
  Filled 2017-06-11 (×2): qty 2

## 2017-06-11 MED ORDER — SODIUM BICARBONATE 650 MG PO TABS
650.0000 mg | ORAL_TABLET | Freq: Once | ORAL | Status: AC
  Administered 2017-06-11: 650 mg via ORAL
  Filled 2017-06-11: qty 1

## 2017-06-11 MED ORDER — FUROSEMIDE 10 MG/ML IJ SOLN
40.0000 mg | Freq: Once | INTRAMUSCULAR | Status: AC
  Administered 2017-06-11: 40 mg via INTRAVENOUS

## 2017-06-11 NOTE — Progress Notes (Signed)
Patient continues to tolerate 28% aerosol trach collar well at this time. No signs of respiratory distress. Patient is resting comfortably at this time. Ventilator on stand-by at bedside if needed. RT will continue to monitor.

## 2017-06-11 NOTE — Progress Notes (Signed)
STROKE TEAM PROGRESS NOTE   SUBJECTIVE (INTERVAL HISTORY) Her nurse is at the bedside.  Patient is resting, opens eyes top voice, and follows all commands appropriately.  Tracheostomy in place.  Pt transitioned to trach collar this morning.  Some tachypnea to max of 20 breaths/minute this morning with SpO2 99-100%.  Will consider transfer to [facility] once pt is stable on trach collar for 24+ hours.  Hyperglycemic to 243 this morning with WBC of 15.3 and no fever, likely due to recent steroids for upper airway angioedema.  OBJECTIVE Temp:  [98.5 F (36.9 C)-99.9 F (37.7 C)] 98.9 F (37.2 C) (08/24 1200) Pulse Rate:  [69-101] 79 (08/24 1110) Cardiac Rhythm: Sinus tachycardia (08/24 0800) Resp:  [12-22] 22 (08/24 1110) BP: (87-145)/(48-78) 106/56 (08/24 1100) SpO2:  [99 %-100 %] 99 % (08/24 1110) FiO2 (%):  [28 %-40 %] 28 % (08/24 1110) Weight:  [76 kg (167 lb 8.8 oz)] 76 kg (167 lb 8.8 oz) (08/24 0500)  CBC:   Recent Labs Lab 06/07/17 0227 06/10/17 0630  WBC 9.6 15.3*  HGB 8.5* 8.9*  HCT 27.6* 27.2*  MCV 87.1 88.3  PLT 300 093    Basic Metabolic Panel:   Recent Labs Lab 06/04/17 1827  06/06/17 0236  06/09/17 2346 06/10/17 0630  NA  --   < > 138  < > 142 140  K  --   < > 3.6  < > 3.2* 3.7  CL  --   < > 108  < > 108 107  CO2  --   < > 22  < > 25 26  GLUCOSE  --   < > 101*  < > 177* 152*  BUN  --   < > 9  < > 31* 29*  CREATININE  --   < > 0.89  < > 0.85 0.86  CALCIUM  --   < > 7.8*  < > 8.4* 8.2*  MG 1.5*  --  1.6*  < > 1.8 1.8  PHOS 2.9  --  2.8  --   --   --   < > = values in this interval not displayed.  Lipid Panel:     Component Value Date/Time   CHOL 297 (H) 06/02/2017 0741   TRIG 220 (H) 06/10/2017 0630   HDL 49 06/02/2017 0741   CHOLHDL 6.1 06/02/2017 0741   VLDL 37 06/02/2017 0741   LDLCALC 211 (H) 06/02/2017 0741   HgbA1c:  Lab Results  Component Value Date   HGBA1C 9.5 (H) 06/02/2017   Urine Drug Screen: No results found for:  LABOPIA, COCAINSCRNUR, LABBENZ, AMPHETMU, THCU, LABBARB  Alcohol Level No results found for: [place] I have personally reviewed the radiological images below and agree with the radiology interpretations.  Ct Angio Head and neck W Or Wo Contrast 05/31/2017 IMPRESSION: 1. No emergent large vessel occlusion. 2. Large amount of noncalcified plaque at the proximal left internal carotid artery, causing 60-65% stenosis. This is the most likely source of emboli for the previously identified left parietal and occipital infarcts. 3. No other intracranial or cervical arterial stenosis. 4. Minimal Aortic Atherosclerosis (ICD10-I70.0).   Mr Jeri Cos Wo Contrast 05/30/2017 IMPRESSION: Multiple small areas of acute infarct in the left parietal cortex and left occipital cortex. Recommend CTA head and neck to evaluate for source of emboli or stenosis causing these infarcts Numerous chronic white matter infarcts bilaterally   CUS 06/01/2017 1-39% right internal carotid artery stenosis and 40-59% left internal  carotid artery stenosis. Vertebral arteries are patent with antegrade flow.  TTE 8/114/2018  - Left ventricle: The cavity size was normal. Systolic function was normal. The estimated ejection fraction was in the range of 60% to 65%. Wall motion was normal; there were no regional wall motion abnormalities. Doppler parameters are consistent with abnormal left ventricular relaxation (grade 1 diastolic dysfunction).  Carotid Angiography 06/03/2017 FINDING(S):  Type I aortic arch: widely patent  Innominate artery: widely patent ? R common carotid artery: widely patent ? R internal carotid artery: minimal disease, diffuse atherosclerotic plaque suggested ? R external carotid artery: minimal disease, diffuse atherosclerotic plaque suggested ? R subclavian artery: patent ? R vertebral artery: patent proximally  Left common carotid artery: patent ? L internal carotid artery: patent, 43% stenosis by  NASCET proximally ? L external carotid artery: patent  Left subclavian artery: patent ? Left vertebral artery: patent  Intracranial findings to be read by Neuroradiology  CT Head Code Stroke  06/03/2017 IMPRESSION: 1. No acute finding or change from 3 days ago. 2. 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 Chronic small vessel ischemic changes elsewhere. are stable  CTA Head/Neck 06/03/2017 IMPRESSION: 1. Right M1 embolus with subtotal occlusion but severe stenosis. The anterior temporal branch is covered and non-opacified.  2. Elongated embolus within the left M1 and proximal M2 vessels. 3. Negative for dissection or interval ulceration in the arch and cervical carotids. 4. Carotid bifurcation atherosclerosis with approximately 50-60% left ICA bulb narrowing. Visually the stenosis appears worse due to positive remodeling at the low-density plaque. 5. Moderate atheromatous narrowing of the right V4 and mid basilar.  CT Head WO Contrast 06/03/2017 IMPRESSION: 1. Several small foci of enhancement within bilateral parietal and left frontal cortex as well as left basal ganglia probably representing enhancing infarctions. 2. No large hemorrhage, focal mass effect, herniation, or hydrocephalus.  DG Chest Port 1 View 06/03/2017 IMPRESSION: 1. The ETT terminates 1.5 cm above the carina. Recommend withdrawing 1 cm. 2. New infiltrate in the lateral left lung base is nonspecific.  Recommend attention on follow-up.  DSA 06/03/2017 S/P bilateral common carotid arteriogram,and Rt vert angiogram,followed by complete revascularization of occluded  MCAs M1 segments bilaterally, with x 1 pass with embotrap 85mmx 35 mm  Retrieval device achieving a TICI3 reperfusion bilaterally.  EEG 06/03/2017 This EEG is abnormal and findings are suggestive of mild generalized cerebral dysfunction. Intermittent arm twitching was not associated with any epileptiform  activity.  MRI Brain Wo Contrast 06/04/2017 IMPRESSION: 1. 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 No hemorrhagic conversion. 2. Moderate chronic small vessel ischemic disease.    PHYSICAL EXAM  Temp:  [98.5 F (36.9 C)-99.9 F (37.7 C)] 98.9 F (37.2 C) (08/24 1200) Pulse Rate:  [69-101] 79 (08/24 1110) Resp:  [12-22] 22 (08/24 1110) BP: (87-145)/(48-78) 106/56 (08/24 1100) SpO2:  [99 %-100 %] 99 % (08/24 1110) FiO2 (%):  [28 %-40 %] 28 % (08/24 1110) Weight:  [76 kg (167 lb 8.8 oz)] 76 kg (167 lb 8.8 oz) (08/24 0500)  General - obese, well developed, s/p tracheostomy  Ophthalmologic - Fundi not visualized.  Cardiovascular - Regular rate and rhythm.  Neuro -s/p tracheostomy.. Eyes closed but open on voice and following simple peripheral and central commands. Eyes moving both direction on commands, but not complete. PERRL, blinking to visual threat bilaterally and tracking.  . Mild left upper extremity weakness 3/5  . LLE 4/5  at least, RLE 4-/5. DTR 1+ and no babinski. Sensation, coordination and gait not tested.   ASSESSMENT/PLAN Ms. BRAIDYN PEACE is a 66 y.o. female with history of HTN, HLD, pseudomotor cerebri, DM  presenting with headache and weakness of right hand. She did not receive IV t-PA due to out of window. CTA head and neck showed left ICA 65% stenosis. Dr. Bridgett Larsson consulted. During cerebral angiogram this morning, patient had acute mental status decline, became unresponsive. Code stroke called.  Found to have M1 thrombus, status post mechanical thrombectomy. MRI 06/04/2017 with numerous acute new supra- and infratentorial infarcts spanning multiple vascular territories consistent with embolic phenomena.   Stroke - bilateral MCA acute thrombosis, likely related to cerebral angiogram procedure treated with bilateral mechanical embolectomy with embotrap device with excellent  revascularization  Acute mental status decline during procedure  CT negative for acute hemorrhage or infarct  CTA head and neck showed bilateral MCA acute thrombosis  S/p mechanical thrombectomy with bilateral TICI3 reperfusion  EEG with no seizure activity  MRI showed bilateral supra-and infratentorial infarcts, consistent with cardioembolic phenomena  Continue DAPT and supportive care   Clinically much improved  Wean off vent as able  Recent stroke: acute and subacute small pathcy infarcts in the left MCA distribution, possibly artery to artery embolic due to left ICA stenosis.     Resultant  Right hand subtle weakness  MRI head Multiple small areas of acute infarct in the left parietal cortex and left occipital cortex. Numerous chronic white matter infarcts bilaterally  CTA head and neck - Large amount of noncalcified plaque at the proximal left internal carotid artery, causing 60-65% stenosis.   2D Echo  EF 60-65%  CUS - left ICA 40-59% stenosis  Cerebral angio < 50% left ICA stenosis  LDL 211  HgbA1c 9.5  SCDs for VTE prophylaxis Diet NPO time specified  aspirin 81 mg daily prior to admission, now on aspirin 325 mg daily and clopidogrel 75 mg daily.    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  self-reports leg numbness, chest pain, severe headaches with statins on multiple occassions with multiple providers  Recommend follow up with PCP Dr. Felipa Eth for consideration of PCSK9 inhibitor. Medication name given to pt.  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 Jaynee Eagles at [facility] day # 11 Plan : Continue  ventilatory wean and trach collar trials as per P CCM.Marland Kitchen Discussed with patient's RN and Dr. Lamonte Sakai and answered questions. No family at bedside. Speech therapy  eval when she is on trach collar. Likely transfer to rehabilitation or LTAC if she qualifies in the next few days This patient is critically ill due to code stroke status, acute mental status change, bilateral MCA thrombosis status post thrombectomy and at significant risk of neurological worsening, death form recurrent stroke, hemorrhagic transformation, seizure, brain edema, cerebral herniation. This patient's care requires constant monitoring of vital signs, hemodynamics, respiratory and cardiac monitoring, review of multiple databases, neurological assessment, discussion with family, other specialists and medical decision making of high complexity. I spent 30 minutes of neurocritical care time in the care of this patient.   Antony Contras, MD Stroke Neurology 06/11/2017 1:28 PM   To contact Stroke Continuity provider, please refer to [email or web address] After hours, contact General Neurology

## 2017-06-11 NOTE — Progress Notes (Signed)
PULMONARY / CRITICAL CARE MEDICINE   Name: TEELA NARDUCCI MRN: [id number] DOB: [birth year]    ADMISSION DATE:  05/31/2017 CONSULTATION DATE:  8/16  REFERRING MD:  Erlinda Hong   CHIEF COMPLAINT:  Vent management   HISTORY OF PRESENT ILLNESS:   66yo female with hx HTN, DM who presented initially 8/13 to her PCP with headache, R sided weakness and vision changes.  She was sent for MRI which revealed multiple areas of acute L MCA infarct.  She was sent to ER and admitted for CVA workup. On 8/16 pt was in cath lab for diagnostic carotid and cerebral angiogram with vascular surgery when she became suddenly unresponsive with L sided weakness and possible L sided seizure-like activity.  CTA revealed bilateral M1 occlusions.  She was intubated and taken urgently to IR for revascularization and rapid resolution of embolism.  PCCM consulted for ICU/vent management post procedure.   SUBJECTIVE:  More awake today but still sleepy, remains onPRVC She was able to cough on command  VITAL SIGNS: BP 127/74   Pulse 90   Temp 98.8 F (37.1 C) (Oral)   Resp 19   Ht 5\' 4"  (1.626 m)   Wt 76 kg (167 lb 8.8 oz)   SpO2 100%   BMI 28.76 kg/m   HEMODYNAMICS:    VENTILATOR SETTINGS: Vent Mode: PRVC FiO2 (%):  [28 %-40 %] 28 % Set Rate:  [16 bmp] 16 bmp Vt Set:  [440 mL] 440 mL PEEP:  [5 cmH20] 5 cmH20 Plateau Pressure:  [10 cmH20-16 cmH20] 16 cmH20  INTAKE / OUTPUT:  Intake/Output Summary (Last 24 hours) at 06/11/17 0824 Last data filed at 06/11/17 0700  Gross per 24 hour  Intake          1440.71 ml  Output             2525 ml  Net         -1084.29 ml   General appearance:  Obese, ventilated, no distress noted Eyes: Pupils equal, eye movements normal Mouth:  oropharynx clear Lungs/chest: Decreased bilateral basilar breath sounds, otherwise clear; trachea in good position, clean dry CV: Regular, no murmur Abdomen: Soft, nontender, positive bowel sounds Extremities: No significant edema Skin: No  rash Neuro: Wakes easily to voice(better than 8/23), quickly back to sleep, followed commands, moderate strength on cough   LABS:  BMET  Recent Labs Lab 06/08/17 0245 06/09/17 2346 06/10/17 0630  NA 140 142 140  K 3.7 3.2* 3.7  CL 109 108 107  CO2 24 25 26   BUN 16 31* 29*  CREATININE 0.90 0.85 0.86  GLUCOSE 202* 177* 152*    Electrolytes  Recent Labs Lab 06/04/17 1827  06/06/17 0236 06/07/17 0227 06/08/17 0245 06/09/17 2346 06/10/17 0630  CALCIUM  --   < > 7.8* 7.9* 8.4* 8.4* 8.2*  MG 1.5*  --  1.6* 2.3  --  1.8 1.8  PHOS 2.9  --  2.8  --   --   --   --   < > = values in this interval not displayed.  CBC  Recent Labs Lab 06/06/17 0236 06/07/17 0227 06/10/17 0630  WBC 10.3 9.6 15.3*  HGB 8.9* 8.5* 8.9*  HCT 28.5* 27.6* 27.2*  PLT 258 300 362    Coag's  Recent Labs Lab 06/10/17 0630 06/10/17 0808  INR QUESTIONABLE RESULTS, RECOMMEND RECOLLECT TO VERIFY 0.99    Sepsis Markers No results for input(s): LATICACIDVEN, PROCALCITON, O2SATVEN in the last 168 hours.  ABG  Recent  Labs Lab 06/07/17 1542  PHART 7.421  PCO2ART 43.7  PO2ART 393.0*    Liver Enzymes No results for input(s): AST, ALT, ALKPHOS, BILITOT, ALBUMIN in the last 168 hours.  Cardiac Enzymes No results for input(s): TROPONINI, PROBNP in the last 168 hours.  Glucose  Recent Labs Lab 06/10/17 1516 06/10/17 1732 06/10/17 2044 06/11/17 0036 06/11/17 0424 06/11/17 0801  GLUCAP 213* 275* 145* 207* 87 147*    Imaging Dg Chest Port 1 View  Result Date: 06/10/2017 CLINICAL DATA:  Tracheostomy placement EXAM: PORTABLE CHEST 1 VIEW COMPARISON:  June 07, 2017 FINDINGS: Tracheostomy now present with tip 3.8 cm above the carina. Feeding tube tip is below the diaphragm. There is a catheter either in or overlying the right upper abdominal region. No pneumothorax. There is a small left pleural effusion with left base atelectasis. Lungs elsewhere are clear. Heart is mildly enlarged  with pulmonary vascularity within normal limits. No adenopathy. No bone lesions. IMPRESSION: Tracheostomy now present. Tube positions as described without pneumothorax. Small left pleural effusion with left base atelectasis. Right lung clear. Stable cardiac silhouette. Electronically Signed   By: Lowella Grip III M.D.   On: 06/10/2017 10:57     STUDIES:  MR brain 8/12>>> Multiple small areas of acute infarct in the left parietal cortex and left occipital cortex. Recommend CTA head and neck to evaluate for source of emboli or stenosis causing these infarcts. Numerous chronic white matter infarcts bilaterally CTA head 8/16>>> 1. Right M1 embolus with subtotal occlusion but severe stenosis. The anterior temporal branch is covered and non-opacified. 2. Elongated embolus within the left M1 and proximal M2 vessels. 3. Negative for dissection or interval ulceration in the arch and cervical carotids. 4. Carotid bifurcation atherosclerosis with approximately 50-60% left ICA bulb narrowing. Visually the stenosis appears worse due to positive remodeling at the low-density plaque. 5. Moderate atheromatous narrowing of the right V4 and mid basilar. CT head 8/16>>> 1. Several small foci of enhancement within bilateral parietal and left frontal cortex as well as left basal ganglia probably representing enhancing infarctions. 2. No large hemorrhage, focal mass effect, herniation, or Hydrocephalus. 2D echo 8/14>>>  - Left ventricle: The cavity size was normal. Systolic function was normal. The estimated ejection fraction was in the range of 60% to 65%. Wall motion was normal; there were no regional wall motion abnormalities. Doppler parameters are consistent with abnormal left ventricular relaxation (grade 1 diastolic dysfunction). MRI:  06/04/2017>>> Numerous acute new supra- and infratentorial infarcts spanning multiple vascular territories consistent with embolic phenomena. Nohemorrhagic  conversion.Moderate chronic small vessel ischemic disease.  CULTURES: Tracheal aspirate 8/20 >> MSSA  ANTIBIOTICS: cefazolin 8/22 >>   SIGNIFICANT EVENTS: 8/16 - sudden change in mental status, to IR for bilateral embolectomy for revascularization of bilat MCA occlusions   LINES/TUBES: ETT 8/16>>>8/20>>> R rad aline 8/16>>>8/17  DISCUSSION: 65yo female admitted with acute L MCA stroke, then acute R MCA occlusion during diagnostic arteriogram with AMS requiring intubation.  Taken to IR for revascularization. Reintubated on 8/20 for stridor. Left lower lobe atelectasis/infiltrate, MSSA on respiratory culture. Start antibiotics 8/22  ASSESSMENT / PLAN:  PULMONARY Acute respiratory failure - r/t AMS  Stridor, vocal cord edema P: Push for pressure support, possibly to trach collar on 8/24 once her sedation has fully cleared Antibiotics as below  CARDIOVASCULAR Hx HTN  Off Cleviprex 8/17  P:  Scheduled metoprolol Scheduled Catapres Labetalol as needed SBP goal 120-140  RENAL At risk for fluid and electrolyte imbalance  P:  Follow BMP, urine output Continue Gentle diuresis, remains 4.8 L positive  GASTROINTESTINAL Vomited 8/17 , Coretrack out 8/17  ABD film neg 8/17  Gastroparesis  P:   Tube feeds via cortrac PPI for SUP Speech therapy evaluation when she is able to achieve trach collar  HEMATOLOGIC Bilateral MCA occlusion  L ICA stenosis  P:  Follow CBC Hemoglobin goal > 7.0   INFECTIOUS Leukocytosis>> ? Reactive>resolved  MSSA bronchitis versus pneumonia P: Continue cefazolin, PCN allergy but has tolerated cephalosporins in the past    ENDOCRINE DM - Hgb A1c >9 Hypoglycemia  P:   Sliding-scale insulin and basal coverage   NEUROLOGIC Bilateral acute MCA occlusion- s/p IR revascularization  Left ICA stenosis P:   RAS goal  0 Plavix and aspirin Minimize sedating medications   FAMILY  - Updates: Family updated 8/23  Independent critical care  time is 32 minutes.   Baltazar Apo, MD, PhD 06/11/2017, 8:24 AM  Pulmonary and Critical Care [phone] or if no answer [phone]

## 2017-06-11 NOTE — Progress Notes (Signed)
Physical Therapy Treatment Patient Details Name: [name] MRN: [id number] DOB: 08/26/1951 Today's Date: 06/11/2017    History of Present Illness Pt is a 66 y/o female with hx HTN, DM. She presented initially 8/13 to her PCP with headache, R hight weakness and vision changes. She was sent for MRI which revealed multiple areas of acute L MCA infarct. She was sent to ER and admitted for CVA workup. On 8/16 pt was in cath lab for diagnostic carotid and cerebral angiogram with vascular surgery when she became suddenly unresponsive with L sided weakness and possible L sided seizure-like activity. CTA revealed bilateral M1 occlusions. She was intubated and taken urgently to IR for revascularization and rapid resolution of embolism.    PT Comments    Pt very lethargic today, often nods, "no" to questions but does follow some single step commands to participate. Fatigues very quickly sitting EOB, required mod to max A to maintain sitting x10 mins. PT will continue to follow.    Follow Up Recommendations  CIR;Supervision/Assistance - 24 hour     Equipment Recommendations  Other (comment) (TBD)    Recommendations for Other Services Rehab consult     Precautions / Restrictions Precautions Precautions: Fall Precaution Comments: NG tube, trach Restrictions Weight Bearing Restrictions: No    Mobility  Bed Mobility Overal bed mobility: Needs Assistance Bed Mobility: Supine to Sit;Sit to Supine     Supine to sit: Total assist;+2 for physical assistance Sit to supine: Total assist;+2 for physical assistance   General bed mobility comments: pt shakes head no when asked if she wants to mobilize but when asked to reach for rail she does initiate movement and participates with getting to EOB. Pt able to partially grasp rail with left hand. Tot A for return to supine, though pt pushing posterior trying to lie down in sitting  Transfers                 General transfer comment:  Unable  Ambulation/Gait             General Gait Details: Unable   Stairs            Wheelchair Mobility    Modified Rankin (Stroke Patients Only) Modified Rankin (Stroke Patients Only) Pre-Morbid Rankin Score: No symptoms Modified Rankin: Severe disability     Balance Overall balance assessment: Needs assistance Sitting-balance support: Feet supported;Bilateral upper extremity supported Sitting balance-Leahy Scale: Zero Sitting balance - Comments: Max assist to maintain sitting balance x10 minutes. Posterior and L lateral pushing at times. Worked on passive ROM UE's in sitting as well as AA reaching with LUE. Pt able to take L hand towards face. Also worked on LE motion with pt able to squeeze knees in and out L>R and tap left foot on floor but not R Postural control: Posterior lean;Left lateral lean                                  Cognition Arousal/Alertness: Lethargic Behavior During Therapy: Flat affect Overall Cognitive Status: Difficult to assess                                 General Comments: Pt intermittently following one step commands appropriately. Shakes head "no" to most questions.      Exercises      General Comments General comments (skin integrity, edema,  etc.): VSS during session, HR as high as 114 bpm, 98 bpm after session, O2 sats 100%, BP 146/68      Pertinent Vitals/Pain Pain Assessment: Faces Faces Pain Scale: Hurts little more Pain Location: grimacing with R shoulder ROM Pain Descriptors / Indicators: Discomfort;Grimacing Pain Intervention(s): Limited activity within patient's tolerance;Monitored during session    Home Living                      Prior Function            PT Goals (current goals can now be found in the care plan section) Acute Rehab PT Goals Patient Stated Goal: none stated PT Goal Formulation: With patient Time For Goal Achievement: 06/15/17 Potential to Achieve  Goals: Fair Progress towards PT goals: Progressing toward goals    Frequency    Min 3X/week      PT Plan Current plan remains appropriate    Co-evaluation              AM-PAC PT "6 Clicks" Daily Activity  Outcome Measure  Difficulty turning over in bed (including adjusting bedclothes, sheets and blankets)?: Unable Difficulty moving from lying on back to sitting on the side of the bed? : Unable Difficulty sitting down on and standing up from a chair with arms (e.g., wheelchair, bedside commode, etc,.)?: Unable Help needed moving to and from a bed to chair (including a wheelchair)?: Total Help needed walking in hospital room?: Total Help needed climbing 3-5 steps with a railing? : Total 6 Click Score: 6    End of Session Equipment Utilized During Treatment: Oxygen Activity Tolerance: Patient limited by lethargy Patient left: in bed;with call bell/phone within reach;with nursing/sitter in room Nurse Communication: Mobility status PT Visit Diagnosis: Difficulty in walking, not elsewhere classified (R26.2);Unsteadiness on feet (R26.81);Hemiplegia and hemiparesis Hemiplegia - Right/Left: Right Hemiplegia - dominant/non-dominant: Dominant Hemiplegia - caused by: Cerebral infarction     Time: 0940-1000 PT Time Calculation (min) (ACUTE ONLY): 20 min  Charges:  $Therapeutic Activity: 8-22 mins                    G Codes:       [place]  [place] 06/11/2017, 10:45 AM

## 2017-06-12 DIAGNOSIS — Z930 Tracheostomy status: Secondary | ICD-10-CM

## 2017-06-12 LAB — GLUCOSE, CAPILLARY
GLUCOSE-CAPILLARY: 280 mg/dL — AB (ref 65–99)
GLUCOSE-CAPILLARY: 293 mg/dL — AB (ref 65–99)
Glucose-Capillary: 179 mg/dL — ABNORMAL HIGH (ref 65–99)
Glucose-Capillary: 64 mg/dL — ABNORMAL LOW (ref 65–99)
Glucose-Capillary: 167 mg/dL — ABNORMAL HIGH (ref 65–99)
Glucose-Capillary: 85 mg/dL (ref 65–99)
Glucose-Capillary: 307 mg/dL — ABNORMAL HIGH (ref 65–99)

## 2017-06-12 LAB — BASIC METABOLIC PANEL
ANION GAP: 13 (ref 5–15)
BUN: 36 mg/dL — ABNORMAL HIGH (ref 6–20)
CHLORIDE: 97 mmol/L — AB (ref 101–111)
CO2: 30 mmol/L (ref 22–32)
Calcium: 8.6 mg/dL — ABNORMAL LOW (ref 8.9–10.3)
Creatinine, Ser: 0.83 mg/dL (ref 0.44–1.00)
GFR calc non Af Amer: >60 mL/min (ref >60)
Glucose, Bld: 209 mg/dL — ABNORMAL HIGH (ref 65–99)
Potassium: 2.9 mmol/L — ABNORMAL LOW (ref 3.5–5.1)
Sodium: 140 mmol/L (ref 135–145)

## 2017-06-12 MED ORDER — INSULIN GLARGINE 100 UNIT/ML ~~LOC~~ SOLN
10.0000 [IU] | Freq: Every day | SUBCUTANEOUS | Status: DC
  Administered 2017-06-12 – 2017-06-13 (×2): 10 [IU] via SUBCUTANEOUS
  Filled 2017-06-12 (×3): qty 0.1

## 2017-06-12 MED ORDER — DEXTROSE 50 % IV SOLN
INTRAVENOUS | Status: AC
  Administered 2017-06-12: 25 mL
  Filled 2017-06-12: qty 50

## 2017-06-12 MED ORDER — POTASSIUM CHLORIDE 20 MEQ/15ML (10%) PO SOLN
40.0000 meq | ORAL | Status: AC
  Administered 2017-06-12 (×2): 40 meq
  Filled 2017-06-12 (×3): qty 30

## 2017-06-12 NOTE — Progress Notes (Signed)
eLink Physician-Brief Progress Note Patient Name: [name] DOB: [birth year] MRN: [id number]   Date of Service  06/12/2017  HPI/Events of Note  Plan to transfer to [facility] bed. No order written.   eICU Interventions  Will transfer to [facility] unit bed.      Intervention Category Minor Interventions: Routine modifications to care plan (e.g. PRN medications for pain, fever)  Petrice Beedy [place] 06/12/2017, 4:32 PM

## 2017-06-12 NOTE — Progress Notes (Signed)
   Daily Progress Note   Assessment/Planning:   POD #9 s/p Arch Aortogram, B carotid and cerebral angiogram   Pt trach and off vent  Fortunately some neuro recovery after mech TE of both MCA  Reportedly pt being considered for CIR   Looks like NeuroIR read L ICA stenosis at 60-65%   At this point, would hold off any further attempt at L ICA intervention this admission.  This episode proves this patient is not a candidate for a transfemoral intervention as a simple arch aortogram should not have resulted in this outcome.  This suggests the aortic arch is more diseased than previously imaged.  I am not convinced that the L ICA stenosis was the etiology for the L CVA.  Can see pt again after neuro rehab completed in a few months hopefully   Subjective  - 9 Days Post-Op   Off vent, on trach collar, no events overnight    Objective   Vitals:   06/12/17 0700 06/12/17 0800 06/12/17 0823 06/12/17 0842  BP: 123/69 (!) 142/111  (!) 142/111  Pulse: 76 91  88  Resp: 16 11  13   Temp:   98.5 F (36.9 C)   TempSrc:   Oral   SpO2: 99% 100%  100%  Weight:      Height:         Intake/Output Summary (Last 24 hours) at 06/12/17 0945 Last data filed at 06/12/17 0800  Gross per 24 hour  Intake             1170 ml  Output             1300 ml  Net             -130 ml    PULM  B rales, trach in place with trach collar  CV  RRR  VASC No hematoma, PSA or bruit in R groin  NEURO LUE 4-5/5, RUE 1/5, RLE 2/5, LLE 4/5, follows instructions    Laboratory   CBC CBC Latest Ref Rng & Units 06/10/2017 06/07/2017 06/06/2017  WBC 4.0 - 10.5 K/uL 15.3(H) 9.6 10.3  Hemoglobin 12.0 - 15.0 g/dL 8.9(L) 8.5(L) 8.9(L)  Hematocrit 36.0 - 46.0 % 27.2(L) 27.6(L) 28.5(L)  Platelets 150 - 400 K/uL 362 300 258    BMET    Component Value Date/Time   NA 140 06/12/2017 0530   NA 137 01/15/2016 1632   K 2.9 (L) 06/12/2017 0530   CL 97 (L) 06/12/2017 0530   CO2 30 06/12/2017 0530   GLUCOSE 209 (H)  06/12/2017 0530   BUN 36 (H) 06/12/2017 0530   BUN 14 01/15/2016 1632   CREATININE 0.83 06/12/2017 0530   CALCIUM 8.6 (L) 06/12/2017 0530   GFRNONAA >60 06/12/2017 0530   GFRAA >60 06/12/2017 0530     Adele Barthel, MD, FACS [facility] Office: [phone] Pager: [phone]  06/12/2017, 9:45 AM

## 2017-06-12 NOTE — Progress Notes (Signed)
eLink Physician-Brief Progress Note Patient Name: [name] DOB: 05/11/1951 MRN: [id number]   Date of Service  06/12/2017  HPI/Events of Note  Hypokalemia  eICU Interventions  Potassium replaced     Intervention Category Intermediate Interventions: Electrolyte abnormality - evaluation and management  DETERDING,ELIZABETH 06/12/2017, 6:45 AM

## 2017-06-12 NOTE — Evaluation (Signed)
[facility] Speaking Valve - Evaluation Patient Details  Name: [name] MRN: [id number] Date of Birth: 1951-05-27  Today's Date: 06/12/2017 Time: 1324-4010 SLP Time Calculation (min) (ACUTE ONLY): 25 min  Past Medical History:  Past Medical History:  Diagnosis Date  . Anemia    takes iron supplement  . Anesthesia complication    disorientation due to pseudotumor  . Diabetes mellitus    IDDM  . Family history of anesthesia complication 44 yrs ago   brother stopped breathing for a minute or two  . GERD (gastroesophageal reflux disease)   . Headache(784.0)    due to pseudotumor; daily headache  . Hypercholesteremia    unable to tolerate statins  . Hypertension    under control with meds., has been on med. x 5 yr.  . Peripheral neuropathy   . PONV (postoperative nausea and vomiting)   . 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 Synovitis of ankle 04/2013   left  . Wears dentures    full   Past Surgical History:  Past Surgical History:  Procedure Laterality Date  . ANKLE ARTHROSCOPY Left 05/18/2013   Procedure: LEFT ANKLE ARTHROSCOPY WITH DEBRIDEMENT,  SUBTALAR OPEN DEBRIDEMENT ;  Surgeon: Wylene Simmer, MD;  Location: [place];  Service: Orthopedics;  Laterality: Left;  . APPENDECTOMY     as a child  . BALLOON DILATION N/A 07/04/2013   Procedure: BALLOON DILATION;  Surgeon: Garlan Fair, MD;  Location: [facility];  Service: Endoscopy;  Laterality: N/A;  . BLADDER SUSPENSION    . BREAST LUMPECTOMY W/ NEEDLE LOCALIZATION Left 05/22/2011  . CAROTID ANGIOGRAPHY Bilateral 06/03/2017   Procedure: Bilateral Carotid Angiography;  Surgeon: Conrad Moyock, MD;  Location: [place] [facility];  Service: Cardiovascular;  Laterality: Bilateral;  . COLONOSCOPY WITH PROPOFOL N/A 05/14/2014   Procedure: COLONOSCOPY WITH PROPOFOL;  Surgeon: Garlan Fair, MD;  Location: [facility];  Service:  Endoscopy;  Laterality: N/A;  . ESOPHAGEAL DILATION  06/23/2006; 08/05/2004  . ESOPHAGOGASTRODUODENOSCOPY N/A 07/04/2013   Procedure: ESOPHAGOGASTRODUODENOSCOPY (EGD);  Surgeon: Garlan Fair, MD;  Location: [facility];  Service: Endoscopy;  Laterality: N/A;  . ESOPHAGOGASTRODUODENOSCOPY (EGD) WITH PROPOFOL N/A 05/14/2014   Procedure: ESOPHAGOGASTRODUODENOSCOPY (EGD) WITH PROPOFOL;  Surgeon: Garlan Fair, MD;  Location: [facility];  Service: Endoscopy;  Laterality: N/A;  . IR ANGIO VERTEBRAL SEL SUBCLAVIAN INNOMINATE UNI R MOD SED  06/03/2017  . IR PERCUTANEOUS ART THROMBECTOMY/INFUSION INTRACRANIAL INC DIAG ANGIO  06/03/2017  . IR PERCUTANEOUS ART THROMBECTOMY/INFUSION INTRACRANIAL INC DIAG ANGIO  06/03/2017  . LUMBAR PERITONEAL SHUNT     x 2  . OVARIAN CYST REMOVAL     age 66  . RADIOLOGY WITH ANESTHESIA N/A 06/03/2017   Procedure: RADIOLOGY WITH ANESTHESIA;  Surgeon: Luanne Bras, MD;  Location: [place];  Service: Radiology;  Laterality: N/A;  . SHUNT REVISION  2007  . TOTAL ABDOMINAL HYSTERECTOMY W/ BILATERAL SALPINGOOPHORECTOMY  1994   age 66  . WRIST SURGERY Left 2014    [name]   HPI:  66yo female with hx HTN, DM who presented initially 8/13 to her PCP with headache, R sided weakness and vision changes. She was sent for MRI which revealed multiple areas of acute L MCA infarct. She was sent to ER and admitted for CVA workup. On 8/16 pt was in cath lab for diagnostic carotid and cerebral angiogram with vascular surgery  when she became suddenly unresponsive with L sided weakness and possible L sided seizure-like activity. CTA revealed bilateral M1 occlusions. She was intubated and taken urgently to IR for revascularization and rapid resolution of embolism. ETT 8/16-8/20, 8/20-8/23, trach 8/23, pt tolerated trach collar overnight.   Assessment / Plan / Recommendation Clinical Impression  Patient evaluated today for PMSV. Upon SLP arrival, RT present and provided tracheal  suctioning during cuff deflation. Pt requiring suctioning every 2 hours per RN, who reports frequent coughing and difficulty expectorating secretions tracheally/orally. Pt tolerated cuff deflation with no signs of distress, vitals remained stable with pulse in low-mid 90s, RR low 20s, Sp02 upper 90s. With finger occlusion, pt able to redirect air through upper airway evidenced by wet, gravely phonation. PMSV placed for intervals of 2-3 breath cycles with no signs of respiratory distress or change in vitals, though intermittent back pressure noted, suggestive of air trapping. Pt tolerated valve for increasing intervals up to 5 min, again with stable vitals and no signs of distress. With encouragement, pt communicating at sentence level with mod cues for breath support, increased vocal amplitude, phonation. Again noted back pressure with removal of PMSV, suggestive of air trapping. Recommend pt wear PMV with SLP only, cuff must be deflated prior to placing PMSV. Cuff left deflated per RT. Will follow up for PMV trials, expect toleration to improve with reduction in secretions, ability to tolerate smaller size and/or cuffless trach. Pt would benefit from swallowing and cognitive linguistic evaluations with improved toleration of PMV. SLP Visit Diagnosis: Aphonia (R49.1)    SLP Assessment  Patient needs continued Speech Lanaguage Pathology Services    Follow Up Recommendations  Other (comment) (TBD)    Frequency and Duration min 3x week  2 weeks    PMSV Trial PMSV was placed for: 5 minute intervals Able to redirect subglottic air through upper airway: Yes Able to Attain Phonation: Yes Voice Quality: Low vocal intensity;Hoarse Able to Expectorate Secretions: No Level of Secretion Expectoration with PMSV: Tracheal Breath Support for Phonation: Adequate Intelligibility: Intelligibility reduced Word: 75-100% accurate Phrase: 50-74% accurate Sentence: 50-74% accurate Conversation: Not  tested Respirations During Trial: 24 SpO2 During Trial: 97 % Pulse During Trial: 96 Behavior: Alert;Cooperative;Expresses self well;Responsive to questions   Tracheostomy Tube  Additional Tracheostomy Tube Assessment Fenestrated: No Trach Collar Period: overnight Secretion Description: oozing, yellow-brown Frequency of Tracheal Suctioning: Q2 hrs per RN Level of Secretion Expectoration: Tracheal    Vent Dependency  Vent Dependent: No FiO2 (%): 28 %    Cuff Deflation Trial  GO Tolerated Cuff Deflation: Yes Length of Time for Cuff Deflation Trial: 25 min Behavior: Alert;Good eye contact;Cooperative        Aliene Altes 06/12/2017, 5:21 PM   Deneise Lever, [place], [place] Speech-Language Pathologist [phone]

## 2017-06-12 NOTE — Progress Notes (Signed)
PULMONARY / CRITICAL CARE MEDICINE   Name: [name] MRN: [id number] DOB: [birth year]    ADMISSION DATE:  05/31/2017 CONSULTATION DATE:  8/16  REFERRING MD:  Erlinda Hong   CHIEF COMPLAINT:  Vent management   HISTORY OF PRESENT ILLNESS:   66yo female with hx HTN, DM who presented initially 8/13 to her PCP with headache, R sided weakness and vision changes.  She was sent for MRI which revealed multiple areas of acute L MCA infarct.  She was sent to ER and admitted for CVA workup. On 8/16 pt was in cath lab for diagnostic carotid and cerebral angiogram with vascular surgery when she became suddenly unresponsive with L sided weakness and possible L sided seizure-like activity.  CTA revealed bilateral M1 occlusions.  She was intubated and taken urgently to IR for revascularization and rapid resolution of embolism.  PCCM consulted for ICU/vent management post procedure.   SUBJECTIVE:  Awake, tolerated TC overnight.  VITAL SIGNS: BP 128/67   Pulse 82   Temp 98.5 F (36.9 C) (Oral)   Resp 14   Ht 5\' 4"  (1.626 m)   Wt 74 kg (163 lb 2.3 oz)   SpO2 98%   BMI 28.00 kg/m   HEMODYNAMICS:    VENTILATOR SETTINGS: FiO2 (%):  [28 %] 28 %  INTAKE / OUTPUT:  Intake/Output Summary (Last 24 hours) at 06/12/17 1114 Last data filed at 06/12/17 1100  Gross per 24 hour  Intake             1205 ml  Output              900 ml  Net              305 ml   General appearance:  Obese, on TC, no distress noted Eyes: Pupils equal, eye movements normal Mouth:  oropharynx clear Lungs/chest: Decreased bilateral basilar breath sounds, otherwise clear; trachea in good position, clean dry CV: Regular, no murmur Abdomen: Soft, nontender, positive bowel sounds Extremities: No significant edema Skin: No rash Neuro: Awake and interactive  LABS:  BMET  Recent Labs Lab 06/09/17 2346 06/10/17 0630 06/12/17 0530  NA 142 140 140  K 3.2* 3.7 2.9*  CL 108 107 97*  CO2 25 26 30   BUN 31* 29* 36*   CREATININE 0.85 0.86 0.83  GLUCOSE 177* 152* 209*   Electrolytes  Recent Labs Lab 06/06/17 0236 06/07/17 0227  06/09/17 2346 06/10/17 0630 06/12/17 0530  CALCIUM 7.8* 7.9*  < > 8.4* 8.2* 8.6*  MG 1.6* 2.3  --  1.8 1.8  --   PHOS 2.8  --   --   --   --   --   < > = values in this interval not displayed.  CBC  Recent Labs Lab 06/06/17 0236 06/07/17 0227 06/10/17 0630  WBC 10.3 9.6 15.3*  HGB 8.9* 8.5* 8.9*  HCT 28.5* 27.6* 27.2*  PLT 258 300 362   Coag's  Recent Labs Lab 06/10/17 0630 06/10/17 0808  INR QUESTIONABLE RESULTS, RECOMMEND RECOLLECT TO VERIFY 0.99   Sepsis Markers No results for input(s): LATICACIDVEN, PROCALCITON, O2SATVEN in the last 168 hours.  ABG  Recent Labs Lab 06/07/17 1542  PHART 7.421  PCO2ART 43.7  PO2ART 393.0*   Liver Enzymes No results for input(s): AST, ALT, ALKPHOS, BILITOT, ALBUMIN in the last 168 hours.  Cardiac Enzymes No results for input(s): TROPONINI, PROBNP in the last 168 hours.  Glucose  Recent Labs Lab 06/11/17 1132 06/11/17 1558 06/11/17 1932  06/11/17 2358 06/12/17 0354 06/12/17 0426  GLUCAP 243* 199* 243* 250* 64* 179*   Imaging No results found.  STUDIES:  MR brain 8/12>>> Multiple small areas of acute infarct in the left parietal cortex and left occipital cortex. Recommend CTA head and neck to evaluate for source of emboli or stenosis causing these infarcts. Numerous chronic white matter infarcts bilaterally CTA head 8/16>>> 1. Right M1 embolus with subtotal occlusion but severe stenosis. The anterior temporal branch is covered and non-opacified. 2. Elongated embolus within the left M1 and proximal M2 vessels. 3. Negative for dissection or interval ulceration in the arch and cervical carotids. 4. Carotid bifurcation atherosclerosis with approximately 50-60% left ICA bulb narrowing. Visually the stenosis appears worse due to positive remodeling at the low-density plaque. 5. Moderate atheromatous  narrowing of the right V4 and mid basilar. CT head 8/16>>> 1. Several small foci of enhancement within bilateral parietal and left frontal cortex as well as left basal ganglia probably representing enhancing infarctions. 2. No large hemorrhage, focal mass effect, herniation, or Hydrocephalus. 2D echo 8/14>>>  - Left ventricle: The cavity size was normal. Systolic function was normal. The estimated ejection fraction was in the range of 60% to 65%. Wall motion was normal; there were no regional wall motion abnormalities. Doppler parameters are consistent with abnormal left ventricular relaxation (grade 1 diastolic dysfunction). MRI:  06/04/2017>>> Numerous acute new supra- and infratentorial infarcts spanning multiple vascular territories consistent with embolic phenomena. Nohemorrhagic conversion.Moderate chronic small vessel ischemic disease.  CULTURES: Tracheal aspirate 8/20 >> MSSA  ANTIBIOTICS: cefazolin 8/22 >>   SIGNIFICANT EVENTS: 8/16 - sudden change in mental status, to IR for bilateral embolectomy for revascularization of bilat MCA occlusions   LINES/TUBES: ETT 8/16>>>8/20>>>8/23 R rad aline 8/16>>>8/17 Trach 8/23>>>  I reviewed CXR myself, trach is in good position.  DISCUSSION: 66yo female admitted with acute L MCA stroke, then acute R MCA occlusion during diagnostic arteriogram with AMS requiring intubation.  Taken to IR for revascularization. Reintubated on 8/20 for stridor. Left lower lobe atelectasis/infiltrate, MSSA on respiratory culture. Start antibiotics 8/22  ASSESSMENT / PLAN:  PULMONARY Acute respiratory failure - r/t AMS  Stridor, vocal cord edema P: Maintain on TC as tolerated Titrate o2 for sat of 88-92% Antibiotics as below PMV per speech Cuff down ok.  CARDIOVASCULAR Hx HTN  Off Cleviprex 8/17  P:  Scheduled metoprolol Scheduled Catapres Labetalol as needed SBP goal 120-140  RENAL At risk for fluid and electrolyte imbalance  P:   Follow  BMP, urine output Continue Gentle diuresis, remains 4.8 L positive  GASTROINTESTINAL Vomited 8/17 , Coretrack out 8/17  ABD film neg 8/17  Gastroparesis  P:   Tube feeds via cortrac PPI for SUP Speech therapy evaluation when she is able to achieve trach collar  HEMATOLOGIC Bilateral MCA occlusion  L ICA stenosis  P:  Follow CBC Hemoglobin goal > 7.0  INFECTIOUS Leukocytosis>> ? Reactive>resolved  MSSA bronchitis versus pneumonia P: Continue cefazolin, PCN allergy but has tolerated cephalosporins in the past    ENDOCRINE DM - Hgb A1c >9 Hypoglycemia  P:   Sliding-scale insulin and basal coverage Lantus added  NEUROLOGIC Bilateral acute MCA occlusion- s/p IR revascularization  Left ICA stenosis P:   RAS goal  0 Plavix and aspirin Minimize sedating medications  FAMILY  - Updates: Patient updated bedside 8/25.  Transfer to SDU and to [facility] service with PCCM following for trach management.  Discussed with TRH-MD  Rush Farmer, M.D. [facility] Pulmonary/Critical Care Medicine.  Pager: [phone]. After hours pager: [phone].

## 2017-06-12 NOTE — Progress Notes (Addendum)
STROKE TEAM PROGRESS NOTE   SUBJECTIVE (INTERVAL HISTORY) Her daughter is at the bedside.  Patient is doing well and follows all commands appropriately.  Tracheostomy in place.  Pt toleratedo trach collar overnight     OBJECTIVE Temp:  [97.7 F (36.5 C)-98.9 F (37.2 C)] 98.2 F (36.8 C) (08/25 1143) Pulse Rate:  [74-99] 82 (08/25 1100) Cardiac Rhythm: Normal sinus rhythm (08/25 0400) Resp:  [10-23] 14 (08/25 1100) BP: (115-150)/(54-111) 128/67 (08/25 1100) SpO2:  [97 %-100 %] 98 % (08/25 1100) FiO2 (%):  [28 %] 28 % (08/25 0842) Weight:  [163 lb 2.3 oz (74 kg)] 163 lb 2.3 oz (74 kg) (08/25 0341)  CBC:   Recent Labs Lab 06/07/17 0227 06/10/17 0630  WBC 9.6 15.3*  HGB 8.5* 8.9*  HCT 27.6* 27.2*  MCV 87.1 88.3  PLT 300 119    Basic Metabolic Panel:   Recent Labs Lab 06/06/17 0236  06/09/17 2346 06/10/17 0630 06/12/17 0530  NA 138  < > 142 140 140  K 3.6  < > 3.2* 3.7 2.9*  CL 108  < > 108 107 97*  CO2 22  < > 25 26 30   GLUCOSE 101*  < > 177* 152* 209*  BUN 9  < > 31* 29* 36*  CREATININE 0.89  < > 0.85 0.86 0.83  CALCIUM 7.8*  < > 8.4* 8.2* 8.6*  MG 1.6*  < > 1.8 1.8  --   PHOS 2.8  --   --   --   --   < > = values in this interval not displayed.  Lipid Panel:     Component Value Date/Time   CHOL 297 (H) 06/02/2017 0741   TRIG 220 (H) 06/10/2017 0630   HDL 49 06/02/2017 0741   CHOLHDL 6.1 06/02/2017 0741   VLDL 37 06/02/2017 0741   LDLCALC 211 (H) 06/02/2017 0741   HgbA1c:  Lab Results  Component Value Date   HGBA1C 9.5 (H) 06/02/2017   Urine Drug Screen: No results found for: LABOPIA, COCAINSCRNUR, LABBENZ, AMPHETMU, THCU, LABBARB  Alcohol Level No results found for: [place] I have personally reviewed the radiological images below and agree with the radiology interpretations.  Ct Angio Head and neck W Or Wo Contrast 05/31/2017 IMPRESSION: 1. No emergent large vessel occlusion. 2. Large amount of noncalcified plaque at the proximal left  internal carotid artery, causing 60-65% stenosis. This is the most likely source of emboli for the previously identified left parietal and occipital infarcts. 3. No other intracranial or cervical arterial stenosis. 4. Minimal Aortic Atherosclerosis (ICD10-I70.0).   Mr Jeri Cos Wo Contrast 05/30/2017 IMPRESSION: Multiple small areas of acute infarct in the left parietal cortex and left occipital cortex. Recommend CTA head and neck to evaluate for source of emboli or stenosis causing these infarcts Numerous chronic white matter infarcts bilaterally   CUS 06/01/2017 1-39% right internal carotid artery stenosis and 40-59% left internal carotid artery stenosis. Vertebral arteries are patent with antegrade flow.  TTE 8/114/2018  - Left ventricle: The cavity size was normal. Systolic function was normal. The estimated ejection fraction was in the range of 60% to 65%. Wall motion was normal; there were no regional wall motion abnormalities. Doppler parameters are consistent with abnormal left ventricular relaxation (grade 1 diastolic dysfunction).  Carotid Angiography 06/03/2017 FINDING(S):  Type I aortic arch: widely patent  Innominate artery: widely patent ? R common carotid artery: widely patent ? R internal carotid artery: minimal disease, diffuse atherosclerotic plaque suggested ?  R external carotid artery: minimal disease, diffuse atherosclerotic plaque suggested ? R subclavian artery: patent ? R vertebral artery: patent proximally  Left common carotid artery: patent ? L internal carotid artery: patent, 43% stenosis by NASCET proximally ? L external carotid artery: patent  Left subclavian artery: patent ? Left vertebral artery: patent  Intracranial findings to be read by Neuroradiology  CT Head Code Stroke  06/03/2017 IMPRESSION: 1. No acute finding or change from 3 days ago. 2. 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 Chronic small vessel  ischemic changes elsewhere. are stable  CTA Head/Neck 06/03/2017 IMPRESSION: 1. Right M1 embolus with subtotal occlusion but severe stenosis. The anterior temporal branch is covered and non-opacified.  2. Elongated embolus within the left M1 and proximal M2 vessels. 3. Negative for dissection or interval ulceration in the arch and cervical carotids. 4. Carotid bifurcation atherosclerosis with approximately 50-60% left ICA bulb narrowing. Visually the stenosis appears worse due to positive remodeling at the low-density plaque. 5. Moderate atheromatous narrowing of the right V4 and mid basilar.  CT Head WO Contrast 06/03/2017 IMPRESSION: 1. Several small foci of enhancement within bilateral parietal and left frontal cortex as well as left basal ganglia probably representing enhancing infarctions. 2. No large hemorrhage, focal mass effect, herniation, or hydrocephalus.  DG Chest Port 1 View 06/03/2017 IMPRESSION: 1. The ETT terminates 1.5 cm above the carina. Recommend withdrawing 1 cm. 2. New infiltrate in the lateral left lung base is nonspecific.  Recommend attention on follow-up.  DSA 06/03/2017 S/P bilateral common carotid arteriogram,and Rt vert angiogram,followed by complete revascularization of occluded  MCAs M1 segments bilaterally, with x 1 pass with embotrap 61mmx 35 mm  Retrieval device achieving a TICI3 reperfusion bilaterally.  EEG 06/03/2017 This EEG is abnormal and findings are suggestive of mild generalized cerebral dysfunction. Intermittent arm twitching was not associated with any epileptiform activity.  MRI Brain Wo Contrast 06/04/2017 IMPRESSION: 1. 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 No hemorrhagic conversion. 2. Moderate chronic small vessel ischemic disease.    PHYSICAL EXAM  Temp:  [97.7 F (36.5 C)-98.9 F (37.2 C)] 98.2 F (36.8 C) (08/25 1143) Pulse Rate:  [74-99]  82 (08/25 1100) Resp:  [10-23] 14 (08/25 1100) BP: (115-150)/(54-111) 128/67 (08/25 1100) SpO2:  [97 %-100 %] 98 % (08/25 1100) FiO2 (%):  [28 %] 28 % (08/25 0842) Weight:  [163 lb 2.3 oz (74 kg)] 163 lb 2.3 oz (74 kg) (08/25 0341)  General - obese, well developed, s/p tracheostomy  Ophthalmologic - Fundi not visualized.  Cardiovascular - Regular rate and rhythm.  Neuro -s/p tracheostomy.. Eyes closed but open on voice and following simple peripheral and central commands. Eyes moving both direction on commands, but not complete. PERRL, blinking to visual threat bilaterally and tracking.  . Mild left upper extremity weakness 3/5  . LLE 4/5 at least, RLE 4-/5. DTR 1+ and no babinski. Sensation, coordination and gait not tested.   ASSESSMENT/PLAN Ms. TZIREL LEONOR is a 66 y.o. female with history of HTN, HLD, pseudomotor cerebri, DM  presenting with headache and weakness of right hand. She did not receive IV t-PA due to out of window. CTA head and neck showed left ICA 65% stenosis. Dr. Bridgett Larsson consulted. During cerebral angiogram this morning, patient had acute mental status decline, became unresponsive. Code stroke called.  Found to have M1 thrombus, status post mechanical thrombectomy. MRI 06/04/2017 with numerous acute new  supra- and infratentorial infarcts spanning multiple vascular territories consistent with embolic phenomena.   Stroke - bilateral MCA acute thrombosis, likely related to cerebral angiogram procedure treated with bilateral mechanical embolectomy with embotrap device with excellent revascularization  Acute mental status decline during procedure  CT negative for acute hemorrhage or infarct  CTA head and neck showed bilateral MCA acute thrombosis  S/p mechanical thrombectomy with bilateral TICI3 reperfusion  EEG with no seizure activity  MRI showed bilateral supra-and infratentorial infarcts, consistent with cardioembolic phenomena  Continue DAPT and supportive care    Clinically much improved  Wean off vent as able  Recent stroke: acute and subacute small pathcy infarcts in the left MCA distribution, possibly artery to artery embolic due to left ICA stenosis.     Resultant  Right hand subtle weakness  MRI head Multiple small areas of acute infarct in the left parietal cortex and left occipital cortex. Numerous chronic white matter infarcts bilaterally  CTA head and neck - Large amount of noncalcified plaque at the proximal left internal carotid artery, causing 60-65% stenosis.   2D Echo  EF 60-65%  CUS - left ICA 40-59% stenosis  Cerebral angio < 50% left ICA stenosis  LDL 211  HgbA1c 9.5  SCDs for VTE prophylaxis Diet NPO time specified  aspirin 81 mg daily prior to admission, now on aspirin 325 mg daily and clopidogrel 75 mg daily.    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 self-reports leg numbness, chest pain, severe headaches with statins on multiple occassions with multiple providers  Recommend follow up with PCP Dr. Felipa Eth for consideration of PCSK9 inhibitor. Medication name given to pt.  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 Jaynee Eagles at [facility] day # 12 Plan : Continue  ventilatory wean and trach collar trials as per P CCM.Marland Kitchen Discussed with patient's daughter and Dr. Halford Chessman and answered questions.  Speech therapy eval when she is on trach collar. Likely transfer to rehabilitation or LTAC if she qualifies in the next few days This patient is critically ill due to code stroke status, acute mental status change, bilateral MCA thrombosis status post thrombectomy and at significant risk of neurological worsening, death form recurrent stroke, hemorrhagic transformation, seizure, brain edema, cerebral herniation. This patient's care requires constant monitoring of vital signs, hemodynamics, respiratory and cardiac monitoring, review of multiple databases, neurological assessment, discussion with family, other specialists and medical decision making of high complexity. I spent 30 minutes of neurocritical care time in  the care of this patient.   Antony Contras, MD Stroke Neurology 06/12/2017 11:58 AM   To contact Stroke Continuity provider, please refer to [email or web address] After hours, contact General Neurology

## 2017-06-13 DIAGNOSIS — R338 Other retention of urine: Secondary | ICD-10-CM

## 2017-06-13 DIAGNOSIS — J15211 Pneumonia due to Methicillin susceptible Staphylococcus aureus: Secondary | ICD-10-CM

## 2017-06-13 DIAGNOSIS — R400 Somnolence: Secondary | ICD-10-CM

## 2017-06-13 DIAGNOSIS — E440 Moderate protein-calorie malnutrition: Secondary | ICD-10-CM

## 2017-06-13 DIAGNOSIS — E1143 Type 2 diabetes mellitus with diabetic autonomic (poly)neuropathy: Secondary | ICD-10-CM

## 2017-06-13 DIAGNOSIS — K3184 Gastroparesis: Secondary | ICD-10-CM

## 2017-06-13 DIAGNOSIS — J384 Edema of larynx: Secondary | ICD-10-CM

## 2017-06-13 DIAGNOSIS — E1165 Type 2 diabetes mellitus with hyperglycemia: Secondary | ICD-10-CM

## 2017-06-13 LAB — MAGNESIUM: Magnesium: 2.2 mg/dL (ref 1.7–2.4)

## 2017-06-13 LAB — BASIC METABOLIC PANEL
Anion gap: 11 (ref 5–15)
BUN: 36 mg/dL — ABNORMAL HIGH (ref 6–20)
CALCIUM: 9.1 mg/dL (ref 8.9–10.3)
CHLORIDE: 105 mmol/L (ref 101–111)
CO2: 28 mmol/L (ref 22–32)
CREATININE: 0.82 mg/dL (ref 0.44–1.00)
GFR calc Af Amer: >60 mL/min (ref >60)
GFR calc non Af Amer: >60 mL/min (ref >60)
GLUCOSE: 67 mg/dL (ref 65–99)
Potassium: 4.1 mmol/L (ref 3.5–5.1)
Sodium: 144 mmol/L (ref 135–145)

## 2017-06-13 LAB — GLUCOSE, CAPILLARY
GLUCOSE-CAPILLARY: 65 mg/dL (ref 65–99)
GLUCOSE-CAPILLARY: 166 mg/dL — AB (ref 65–99)
GLUCOSE-CAPILLARY: 157 mg/dL — AB (ref 65–99)
Glucose-Capillary: 136 mg/dL — ABNORMAL HIGH (ref 65–99)
Glucose-Capillary: 147 mg/dL — ABNORMAL HIGH (ref 65–99)
Glucose-Capillary: 148 mg/dL — ABNORMAL HIGH (ref 65–99)
Glucose-Capillary: 203 mg/dL — ABNORMAL HIGH (ref 65–99)

## 2017-06-13 LAB — CBC
HEMATOCRIT: 30.5 % — AB (ref 36.0–46.0)
HEMOGLOBIN: 9.6 g/dL — AB (ref 12.0–15.0)
MCH: 28.2 pg (ref 26.0–34.0)
MCHC: 31.5 g/dL (ref 30.0–36.0)
MCV: 89.4 fL (ref 78.0–100.0)
Platelets: 480 10*3/uL — ABNORMAL HIGH (ref 150–400)
RBC: 3.41 MIL/uL — ABNORMAL LOW (ref 3.87–5.11)
RDW: 14.7 % (ref 11.5–15.5)
WBC: 12.5 10*3/uL — AB (ref 4.0–10.5)

## 2017-06-13 LAB — PHOSPHORUS: PHOSPHORUS: 2.3 mg/dL — AB (ref 2.5–4.6)

## 2017-06-13 MED ORDER — DIPHENHYDRAMINE HCL 50 MG/ML IJ SOLN
25.0000 mg | Freq: Once | INTRAMUSCULAR | Status: AC
  Administered 2017-06-14: 25 mg via INTRAVENOUS
  Filled 2017-06-13: qty 1

## 2017-06-13 MED ORDER — SODIUM CHLORIDE 0.9 % IV SOLN
INTRAVENOUS | Status: DC
  Administered 2017-06-13 – 2017-06-18 (×4): via INTRAVENOUS

## 2017-06-13 MED ORDER — IPRATROPIUM-ALBUTEROL 0.5-2.5 (3) MG/3ML IN SOLN
3.0000 mL | Freq: Four times a day (QID) | RESPIRATORY_TRACT | Status: DC
  Administered 2017-06-13 – 2017-06-18 (×17): 3 mL via RESPIRATORY_TRACT
  Filled 2017-06-13 (×17): qty 3

## 2017-06-13 MED ORDER — PHENOL 1.4 % MT LIQD
1.0000 | OROMUCOSAL | Status: DC | PRN
  Administered 2017-06-13: 1 via OROMUCOSAL
  Filled 2017-06-13: qty 177

## 2017-06-13 MED ORDER — KETOROLAC TROMETHAMINE 30 MG/ML IJ SOLN
30.0000 mg | Freq: Once | INTRAMUSCULAR | Status: AC
  Administered 2017-06-14: 30 mg via INTRAVENOUS
  Filled 2017-06-13: qty 1

## 2017-06-13 MED ORDER — STUDY - INVESTIGATIONAL MEDICATION
20.0000 mg | Freq: Three times a day (TID) | Status: DC
  Administered 2017-06-14 – 2017-06-18 (×13): 20 mg
  Filled 2017-06-13 (×15): qty 1

## 2017-06-13 MED ORDER — INSULIN GLARGINE 100 UNIT/ML ~~LOC~~ SOLN
12.0000 [IU] | Freq: Every day | SUBCUTANEOUS | Status: DC
  Administered 2017-06-14 – 2017-06-16 (×3): 12 [IU] via SUBCUTANEOUS
  Filled 2017-06-13 (×4): qty 0.12

## 2017-06-13 NOTE — Progress Notes (Signed)
06/13/2017 Patient c/o right ear pain with pressure. Dr Clydene Laming was made aware via [place]. [facility] RN.

## 2017-06-13 NOTE — Progress Notes (Signed)
STROKE TEAM PROGRESS NOTE   SUBJECTIVE (INTERVAL HISTORY) Her daughter is at the bedside.  Patient is doing well and follows all commands appropriately.  Tracheostomy in place.  Pt toleratedo trach collar overnight    No word from rehab about bed  OBJECTIVE Temp:  [97.8 F (36.6 C)-99 F (37.2 C)] 98.8 F (37.1 C) (08/26 1155) Pulse Rate:  [73-101] 77 (08/26 1155) Cardiac Rhythm: Normal sinus rhythm (08/26 0815) Resp:  [10-24] 13 (08/26 1155) BP: (81-166)/(43-106) 166/68 (08/26 1155) SpO2:  [93 %-100 %] 99 % (08/26 1155) FiO2 (%):  [28 %] 28 % (08/26 1144) Weight:  [166 lb 3.6 oz (75.4 kg)] 166 lb 3.6 oz (75.4 kg) (08/26 0422)  CBC:   Recent Labs Lab 06/10/17 0630 06/13/17 0250  WBC 15.3* 12.5*  HGB 8.9* 9.6*  HCT 27.2* 30.5*  MCV 88.3 89.4  PLT 362 480*    Basic Metabolic Panel:   Recent Labs Lab 06/10/17 0630 06/12/17 0530 06/13/17 0250  NA 140 140 144  K 3.7 2.9* 4.1  CL 107 97* 105  CO2 26 30 28   GLUCOSE 152* 209* 67  BUN 29* 36* 36*  CREATININE 0.86 0.83 0.82  CALCIUM 8.2* 8.6* 9.1  MG 1.8  --  2.2  PHOS  --   --  2.3*    Lipid Panel:     Component Value Date/Time   CHOL 297 (H) 06/02/2017 0741   TRIG 220 (H) 06/10/2017 0630   HDL 49 06/02/2017 0741   CHOLHDL 6.1 06/02/2017 0741   VLDL 37 06/02/2017 0741   LDLCALC 211 (H) 06/02/2017 0741   HgbA1c:  Lab Results  Component Value Date   HGBA1C 9.5 (H) 06/02/2017   Urine Drug Screen: No results found for: LABOPIA, COCAINSCRNUR, LABBENZ, AMPHETMU, THCU, LABBARB  Alcohol Level No results found for: [place] I have personally reviewed the radiological images below and agree with the radiology interpretations.  Ct Angio Head and neck W Or Wo Contrast 05/31/2017 IMPRESSION: 1. No emergent large vessel occlusion. 2. Large amount of noncalcified plaque at the proximal left internal carotid artery, causing 60-65% stenosis. This is the most likely source of emboli for the previously identified left  parietal and occipital infarcts. 3. No other intracranial or cervical arterial stenosis. 4. Minimal Aortic Atherosclerosis (ICD10-I70.0).   Mr Jeri Cos Wo Contrast 05/30/2017 IMPRESSION: Multiple small areas of acute infarct in the left parietal cortex and left occipital cortex. Recommend CTA head and neck to evaluate for source of emboli or stenosis causing these infarcts Numerous chronic white matter infarcts bilaterally   CUS 06/01/2017 1-39% right internal carotid artery stenosis and 40-59% left internal carotid artery stenosis. Vertebral arteries are patent with antegrade flow.  TTE 8/114/2018  - Left ventricle: The cavity size was normal. Systolic function was normal. The estimated ejection fraction was in the range of 60% to 65%. Wall motion was normal; there were no regional wall motion abnormalities. Doppler parameters are consistent with abnormal left ventricular relaxation (grade 1 diastolic dysfunction).  Carotid Angiography 06/03/2017 FINDING(S):  Type I aortic arch: widely patent  Innominate artery: widely patent ? R common carotid artery: widely patent ? R internal carotid artery: minimal disease, diffuse atherosclerotic plaque suggested ? R external carotid artery: minimal disease, diffuse atherosclerotic plaque suggested ? R subclavian artery: patent ? R vertebral artery: patent proximally  Left common carotid artery: patent ? L internal carotid artery: patent, 43% stenosis by NASCET proximally ? L external carotid artery: patent  Left subclavian  artery: patent ? Left vertebral artery: patent  Intracranial findings to be read by Neuroradiology  CT Head Code Stroke  06/03/2017 IMPRESSION: 1. No acute finding or change from 3 days ago. 2. 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 Chronic small vessel ischemic changes elsewhere. are stable  CTA Head/Neck 06/03/2017 IMPRESSION: 1. Right M1 embolus with subtotal occlusion  but severe stenosis. The anterior temporal branch is covered and non-opacified.  2. Elongated embolus within the left M1 and proximal M2 vessels. 3. Negative for dissection or interval ulceration in the arch and cervical carotids. 4. Carotid bifurcation atherosclerosis with approximately 50-60% left ICA bulb narrowing. Visually the stenosis appears worse due to positive remodeling at the low-density plaque. 5. Moderate atheromatous narrowing of the right V4 and mid basilar.  CT Head WO Contrast 06/03/2017 IMPRESSION: 1. Several small foci of enhancement within bilateral parietal and left frontal cortex as well as left basal ganglia probably representing enhancing infarctions. 2. No large hemorrhage, focal mass effect, herniation, or hydrocephalus.  DG Chest Port 1 View 06/03/2017 IMPRESSION: 1. The ETT terminates 1.5 cm above the carina. Recommend withdrawing 1 cm. 2. New infiltrate in the lateral left lung base is nonspecific.  Recommend attention on follow-up.  DSA 06/03/2017 S/P bilateral common carotid arteriogram,and Rt vert angiogram,followed by complete revascularization of occluded  MCAs M1 segments bilaterally, with x 1 pass with embotrap 47mmx 35 mm  Retrieval device achieving a TICI3 reperfusion bilaterally.  EEG 06/03/2017 This EEG is abnormal and findings are suggestive of mild generalized cerebral dysfunction. Intermittent arm twitching was not associated with any epileptiform activity.  MRI Brain Wo Contrast 06/04/2017 IMPRESSION: 1. 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 No hemorrhagic conversion. 2. Moderate chronic small vessel ischemic disease.    PHYSICAL EXAM  Temp:  [97.8 F (36.6 C)-99 F (37.2 C)] 98.8 F (37.1 C) (08/26 1155) Pulse Rate:  [73-101] 77 (08/26 1155) Resp:  [10-24] 13 (08/26 1155) BP: (81-166)/(43-106) 166/68 (08/26 1155) SpO2:  [93 %-100 %] 99 % (08/26  1155) FiO2 (%):  [28 %] 28 % (08/26 1144) Weight:  [166 lb 3.6 oz (75.4 kg)] 166 lb 3.6 oz (75.4 kg) (08/26 0422)  General - pleasant middle aged Caucasian lady, well developed, s/p tracheostomy  Ophthalmologic - Fundi not visualized.  Cardiovascular - Regular rate and rhythm.  Neuro -s/p tracheostomy.. Eyes closed but open on voice and following simple peripheral and central commands. Eyes moving both direction on commands, but not complete. PERRL, blinking to visual threat bilaterally and tracking.  . Mild left upper extremity weakness 3/5  . LLE 4/5 at least, RLE 4-/5. DTR 1+ and no babinski. Sensation, coordination and gait not tested.   ASSESSMENT/PLAN Ms. LIDWINA KANER is a 66 y.o. female with history of HTN, HLD, pseudomotor cerebri, DM  presenting with headache and weakness of right hand. She did not receive IV t-PA due to out of window. CTA head and neck showed left ICA 65% stenosis. Dr. Bridgett Larsson consulted. During cerebral angiogram this morning, patient had acute mental status decline, became unresponsive. Code stroke called.  Found to have M1 thrombus, status post mechanical thrombectomy. MRI 06/04/2017 with numerous acute new supra- and infratentorial infarcts spanning multiple vascular territories consistent with embolic phenomena.   Stroke - bilateral MCA acute thrombosis, likely related to cerebral angiogram procedure treated with bilateral mechanical embolectomy with embotrap device with excellent revascularization  Acute mental status decline during  procedure  CT negative for acute hemorrhage or infarct  CTA head and neck showed bilateral MCA acute thrombosis  S/p mechanical thrombectomy with bilateral TICI3 reperfusion  EEG with no seizure activity  MRI showed bilateral supra-and infratentorial infarcts, consistent with cardioembolic phenomena  Continue DAPT and supportive care   Clinically much improved  Wean off vent as able  Recent stroke: acute and subacute  small pathcy infarcts in the left MCA distribution, possibly artery to artery embolic due to left ICA stenosis.     Resultant  Right hand subtle weakness  MRI head Multiple small areas of acute infarct in the left parietal cortex and left occipital cortex. Numerous chronic white matter infarcts bilaterally  CTA head and neck - Large amount of noncalcified plaque at the proximal left internal carotid artery, causing 60-65% stenosis.   2D Echo  EF 60-65%  CUS - left ICA 40-59% stenosis  Cerebral angio < 50% left ICA stenosis  LDL 211  HgbA1c 9.5  SCDs for VTE prophylaxis Diet NPO time specified  aspirin 81 mg daily prior to admission, now on aspirin 325 mg daily and clopidogrel 75 mg daily.    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 self-reports leg numbness, chest pain, severe headaches with statins on multiple occassions with multiple providers  Recommend follow up with PCP Dr. Felipa Eth for consideration of PCSK9 inhibitor. Medication name given to pt.  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 Jaynee Eagles at [facility] day # 13 Plan : Continue  ventilatory wean and trach collar trials as per PCCM.Marland Kitchen Discussed with patient's daughter and Dr. Halford Chessman and answered questions.  Speech therapy eval when she is on trach collar. Likely transfer to rehabilitation or LTAC if she qualifies in the next few days This patient is critically ill due to code stroke status, acute mental status change, bilateral MCA thrombosis status post thrombectomy and at significant risk of neurological worsening, death form recurrent stroke, hemorrhagic transformation, seizure, brain edema, cerebral herniation. This patient's care requires constant monitoring of vital signs, hemodynamics, respiratory and cardiac monitoring, review of multiple databases, neurological assessment, discussion with family, other specialists and medical decision making of high complexity. I spent 30 minutes of neurocritical care time in the care of this patient.   Antony Contras, MD Stroke Neurology 06/13/2017 2:52 PM   To contact Stroke Continuity provider, please refer to [email or web address] After hours, contact General Neurology

## 2017-06-13 NOTE — Progress Notes (Signed)
Occupational Therapy Treatment Patient Details Name: [name] MRN: [id number] DOB: 02-Oct-1951 Today's Date: 06/13/2017    History of present illness Pt is a 66 y/o female with hx HTN, DM. She presented initially 8/13 to her PCP with headache, R hight weakness and vision changes. She was sent for MRI which revealed multiple areas of acute L MCA infarct. She was sent to ER and admitted for CVA workup. On 8/16 pt was in cath lab for diagnostic carotid and cerebral angiogram with vascular surgery when she became suddenly unresponsive with L sided weakness and possible L sided seizure-like activity. CTA revealed bilateral M1 occlusions. She was intubated and taken urgently to IR for revascularization and rapid resolution of embolism.   OT comments  Pt able to tolerate OOB transfer this session with total assist +2 for squat pivot EOB>chair. Pt required max assist for washing face and donning socks. Pt with increased alertness/arousal today; shakes head "no" to most questions and following one step commands more consistently. D/c plan remains appropriate. Will continue to follow acutely.   Follow Up Recommendations  CIR;Supervision/Assistance - 24 hour    Equipment Recommendations  Other (comment) (TBD at next venue)    Recommendations for Other Services Rehab consult    Precautions / Restrictions Precautions Precautions: Fall Precaution Comments: NG tube, trach Restrictions Weight Bearing Restrictions: No       Mobility Bed Mobility Overal bed mobility: Needs Assistance Bed Mobility: Supine to Sit     Supine to sit: Max assist;+2 for physical assistance     General bed mobility comments: Patient able to move LEs to EOB, +2 max assist to elevate trunk and rotate to EOB. increased time and effort to perform. patient with posterior bias throughout  Transfers Overall transfer level: Needs assistance Equipment used: None Transfers: Sit to/from [employer] Sit to Stand: Total assist;+2 physical assistance;+2 safety/equipment   Squat pivot transfers: Total assist;+2 physical assistance     General transfer comment: Patient attempting to intiate power up but required total assist to block bilateral LEs and transition trunk     Balance Overall balance assessment: Needs assistance Sitting-balance support: Feet supported;Bilateral upper extremity supported Sitting balance-Leahy Scale: Zero Sitting balance - Comments: moderate to max assist to maintain sitting balance throughout EOB Postural control: Posterior lean;Left lateral lean Standing balance support: Bilateral upper extremity supported Standing balance-Leahy Scale: Zero Standing balance comment: total assist for attempts to stand, unable to clear to upright                           ADL either performed or assessed with clinical judgement   ADL Overall ADL's : Needs assistance/impaired     Grooming: Moderate assistance;Sitting;Wash/dry face               Lower Body Dressing: Maximal assistance;Bed level Lower Body Dressing Details (indicate cue type and reason): to don socks Toilet Transfer: Total assistance;+2 for physical assistance;Squat-pivot Toilet Transfer Details (indicate cue type and reason): Simulated by transfer EOB>chair         Functional mobility during ADLs: Total assistance;+2 for physical assistance (squat pivot only)       Vision       Perception     Praxis      Cognition Arousal/Alertness: Awake/alert Behavior During Therapy: Restless Overall Cognitive Status: Difficult to assess  General Comments: Folowing simple commands with improved consistency        Exercises  Other Exercises Other Exercises: Pt able to perform shoulder shurgs x5 bilaterally   Shoulder Instructions       General Comments VSS throughout    Pertinent Vitals/ Pain       Pain Assessment:  Faces Faces Pain Scale: Hurts even more Pain Location: trach site Pain Descriptors / Indicators: Discomfort;Grimacing;Guarding Pain Intervention(s): Monitored during session  Home Living                                          Prior Functioning/Environment              Frequency  Min 2X/week        Progress Toward Goals  OT Goals(current goals can now be found in the care plan section)  Progress towards OT goals: Progressing toward goals  Acute Rehab OT Goals Patient Stated Goal: none stated OT Goal Formulation: With patient  Plan Discharge plan remains appropriate    Co-evaluation    PT/OT/SLP Co-Evaluation/Treatment: Yes Reason for Co-Treatment: Complexity of the patient's impairments (multi-system involvement)   OT goals addressed during session: ADL's and self-care      AM-PAC PT "6 Clicks" Daily Activity     Outcome Measure   Help from another person eating meals?: Total Help from another person taking care of personal grooming?: A Lot Help from another person toileting, which includes using toliet, bedpan, or urinal?: Total Help from another person bathing (including washing, rinsing, drying)?: Total Help from another person to put on and taking off regular upper body clothing?: A Lot Help from another person to put on and taking off regular lower body clothing?: Total 6 Click Score: 8    End of Session Equipment Utilized During Treatment: Gait belt;Oxygen  OT Visit Diagnosis: Muscle weakness (generalized) (M62.81)   Activity Tolerance Patient tolerated treatment well   Patient Left in chair;with call bell/phone within reach;with chair alarm set;with nursing/sitter in room;with family/visitor present   Nurse Communication Mobility status        Time: 2671-2458 OT Time Calculation (min): 28 min  Charges: OT General Charges $OT Visit: 1 Procedure OT Treatments $Self Care/Home Management : 8-22 mins  Copeland Neisen A. Ulice Brilliant,  M.S., OTR/L Pager: [place] 06/13/2017, 11:59 AM

## 2017-06-13 NOTE — Progress Notes (Signed)
PROGRESS NOTE    [name]  HQI:[id number] DOB: [birth year] DOA: 05/31/2017 PCP: [name], MD   Brief Narrative:  66 y.o. WF PMHx Pseudotumor Cerebri , Seizure, HTN, HLD, Diabetes type 2 uncontrolled with Neuropathy,  Apparently c/o fall last Tuesday and on Wednesday had some headache and then on Thursday had  weakness of the right hand, and numbness of the right hand pt has difficulty seeing out of the right eye.  Pt was seen in the office on Friday and had MRI brain 8/13=> Multiple small areas of acute infarct in the left parietal cortex and left occipital cortex. Recommend CTA head and neck to evaluate for source of emboli or stenosis causing these infarcts  Numerous chronic white matter infarcts bilaterally.8/16 pt was in cath lab for diagnostic carotid and cerebral angiogram with vascular surgery when she became suddenly unresponsive with L sided weakness and possible L sided seizure-like activity. CTA revealed bilateral M1 occlusions. She was intubated and taken urgently to IR for revascularization and rapid resolution of embolism.      Subjective:  8/26 alert and answers questions with nodding yes and no and mildly answers appropriately. Negative SOB, negative CP, negative abdominal pain, negative N/V   Assessment & Plan:   Principal Problem:   Acute CVA (cerebrovascular accident) ([place]) Active Problems:   Type II diabetes mellitus with complication ([place])   Hypertension   Hyponatremia   Stenosis of left carotid artery   Acute respiratory failure with hypoxemia (HCC)   Status post tracheostomy ([place])  Acute embolic stroke -Bilateral MCA occlusion, LICA stenosis, S/P revascularization by IR -Swallow evaluation pending -Stroke team onboard -Aspirin + Plavix -CIR evaluation 8/28  Altered mental status -Multifactorial acute ischemic stroke, MSSA bronchitis/pneumonia -Resolved  Acute respiratory failure with hypoxia/Vocal Cord Edema and stridor  -Patient S/P  tracheostomy 8/23 -Currently on trach collar tolerating well. -Speech continue to work with patient increase PMV use -weaning protocol daily: Decannulation prior to discharge?  positive MSSA Pneumonia bilateral lower lobes -Complete 7 days antibiotics -DuoNeb QID -Frequent suctioning   Essential HTN  -SBP goal 120-140 -Clonidine patch 0.3 mg -Metoprolol 100 mg BID -Labetalol PRN  Diabetes type 2 uncontrolled with complications  -1/32 Hemoglobin A1c = 9.5 -Lantus 12 units daily -NovoLog 4 units q 4 hr -Moderate SSI  Acute Urinary retention -Place Foley  Diabetic Gastroparesis -Resolved    Dysphagia -8/26 requests swallow evaluation  Protein calorie malnutrition -Vital 1.5 goal rate 25 ml/hr, at goal tolerating well     DVT prophylaxis: Lovenox Code Status: Full Family Communication: Family at bedside for discussion of plan of care Disposition Plan: CIR?   Consultants:  [facility] Neu[facility] Stroke team Vascular surgery     Procedures/Significant Events:  8/12 MR brain:  -Multiple small areas of acute infarct in the left parietal cortex and left occipital cortex. - Numerous chronic white matter infarcts bilaterally 8/16 CTA head:-Right . Right M1 embolus with subtotal occlusion but severe stenosis.  -Elongated embolus left M1 and proximal M2 vessels. -Carotid bifurcation atherosclerosis with approximately 50-60% left ICA bulb narrowing. Visually the stenosis appears worse due to positive remodeling at the low-density plaque. 8/16 CT head-Several small foci of enhancement within bilateral parietal and left frontal cortex as well as left basal ganglia probably representing enhancing infarctions. 8/14Echocardiogram-LVEF= 60% to 65%. -Grade 1 diastolic dysfunction 4/40 - sudden change in mental status, to IR for bilateral embolectomy for revascularization of bilat MCA occlusions  8/17 MRI Brain:-Numerous acute new supra- and infratentorial infarcts spanning  multiple  vascular territories consistent with embolic phenomena. Nohemorrhagic conversion.   I have personally reviewed and interpreted all radiology studies and my findings are as above.  VENTILATOR SETTINGS: Trach collar FiO2 20%   Cultures 8/17 MRSA by PCR negative 8/20 Tracheal Aspirate positive MSSA      Antimicrobials: Anti-infectives    Start     Stop   06/09/17 1200  ceFAZolin (ANCEF) IVPB 2g/100 mL premix         06/03/17 1126  vancomycin (VANCOCIN) 1-5 GM/200ML-% IVPB    Comments:  Duininck, Stacey   : cabinet override   06/03/17 2344       Devices    LINES / TUBES:  ETT 8/16>>>8/20>>>8/23 R rad aline 8/16>>>8/17 Tracheostomy 8/23>>>     Continuous Infusions: .  ceFAZolin (ANCEF) IV Stopped (06/13/17 0524)  . clevidipine Stopped (06/03/17 2335)  . dextrose 5 % and 0.9% NaCl 10 mL/hr at 06/13/17 0000  . famotidine (PEPCID) IV Stopped (06/12/17 1700)     Objective: Vitals:   06/13/17 0325 06/13/17 0422 06/13/17 0732 06/13/17 0750  BP:  (!) 137/58 (!) 120/57 (!) 120/57  Pulse: 84 73 80 85  Resp: 16 18 16 10   Temp:  97.8 F (36.6 C) 98.4 F (36.9 C)   TempSrc:  Oral Oral   SpO2: 97% 98% 100% 100%  Weight:  166 lb 3.6 oz (75.4 kg)    Height:        Intake/Output Summary (Last 24 hours) at 06/13/17 0911 Last data filed at 06/13/17 0600  Gross per 24 hour  Intake          1019.17 ml  Output                0 ml  Net          1019.17 ml   Filed Weights   06/11/17 0500 06/12/17 0341 06/13/17 0422  Weight: 167 lb 8.8 oz (76 kg) 163 lb 2.3 oz (74 kg) 166 lb 3.6 oz (75.4 kg)    Examination:  General: Alert, answers questions with yes and no nods and mildly answers appropriately, positive  acute respiratory distress Neck:  Negative scars, masses, torticollis, lymphadenopathy, JVD, tracheostomy in place Lungs: Clear to auscultation on the left, positive right lung rhonchi, negative wheezes, negative crackles  Cardiovascular: Regular rate and rhythm  without murmur gallop or rub normal S1 and S2 Abdomen: negative abdominal pain, nondistended, positive soft, bowel sounds, no rebound, no ascites, no appreciable mass Extremities: No significant cyanosis, clubbing, or edema bilateral lower extremities Skin: Negative rashes, lesions, ulcers Psychiatric:  Negative depression, negative anxiety, negative fatigue, negative mania  Central nervous system:  Cranial nerves II through XII intact, tongue/uvula midline, extremity strength LUE/LLLE 5/5, extremity strength RUE/RLE 3-4/5, sensation intact throughout, finger nose finger left WNL, unable to perform exam on right, quick finger touch on left WNL, unable to perform on the right, negative receptive aphasia.  .     Data Reviewed: Care during the described time interval was provided by me .  I have reviewed this patient's available data, including medical history, events of note, physical examination, and all test results as part of my evaluation.   CBC:  Recent Labs Lab 06/07/17 0227 06/10/17 0630 06/13/17 0250  WBC 9.6 15.3* 12.5*  HGB 8.5* 8.9* 9.6*  HCT 27.6* 27.2* 30.5*  MCV 87.1 88.3 89.4  PLT 300 362 161*   Basic Metabolic Panel:  Recent Labs Lab 06/07/17 0227 06/08/17 0245 06/09/17 2346 06/10/17  0630 06/12/17 0530 06/13/17 0250  NA 140 140 142 140 140 144  K 3.6 3.7 3.2* 3.7 2.9* 4.1  CL 109 109 108 107 97* 105  CO2 24 24 25 26 30 28   GLUCOSE 139* 202* 177* 152* 209* 67  BUN 7 16 31* 29* 36* 36*  CREATININE 0.83 0.90 0.85 0.86 0.83 0.82  CALCIUM 7.9* 8.4* 8.4* 8.2* 8.6* 9.1  MG 2.3  --  1.8 1.8  --  2.2  PHOS  --   --   --   --   --  2.3*   GFR: Estimated Creatinine Clearance: 68 mL/min (by C-G formula based on SCr of 0.82 mg/dL). Liver Function Tests: No results for input(s): AST, ALT, ALKPHOS, BILITOT, PROT, ALBUMIN in the last 168 hours. No results for input(s): LIPASE, AMYLASE in the last 168 hours. No results for input(s): AMMONIA in the last 168  hours. Coagulation Profile:  Recent Labs Lab 06/10/17 0630 06/10/17 0808  INR QUESTIONABLE RESULTS, RECOMMEND RECOLLECT TO VERIFY 0.99   Cardiac Enzymes: No results for input(s): CKTOTAL, CKMB, CKMBINDEX, TROPONINI in the last 168 hours. BNP (last 3 results) No results for input(s): PROBNP in the last 8760 hours. HbA1C: No results for input(s): HGBA1C in the last 72 hours. CBG:  Recent Labs Lab 06/12/17 2002 06/12/17 2355 06/13/17 0422 06/13/17 0621 06/13/17 0731  GLUCAP 307* 280* 65 136* 166*   Lipid Profile: No results for input(s): CHOL, HDL, LDLCALC, TRIG, CHOLHDL, LDLDIRECT in the last 72 hours. Thyroid Function Tests: No results for input(s): TSH, T4TOTAL, FREET4, T3FREE, THYROIDAB in the last 72 hours. Anemia Panel: No results for input(s): VITAMINB12, FOLATE, FERRITIN, TIBC, IRON, RETICCTPCT in the last 72 hours. Urine analysis: No results found for: COLORURINE, APPEARANCEUR, LABSPEC, [place], GLUCOSEU, HGBUR, BILIRUBINUR, KETONESUR, PROTEINUR, UROBILINOGEN, NITRITE, LEUKOCYTESUR Sepsis Labs: @LABRCNTIP (procalcitonin:4,lacticidven:4)  ) Recent Results (from the past 240 hour(s))  MRSA PCR Screening     Status: None   Collection Time: 06/04/17 12:32 AM  Result Value Ref Range Status   MRSA by PCR NEGATIVE NEGATIVE Final    Comment:        The GeneXpert MRSA Assay (FDA approved for NASAL specimens only), is one component of a comprehensive MRSA colonization surveillance program. It is not intended to diagnose MRSA infection nor to guide or monitor treatment for MRSA infections.   Culture, respiratory (NON-Expectorated)     Status: None   Collection Time: 06/07/17 11:58 AM  Result Value Ref Range Status   Specimen Description TRACHEAL ASPIRATE  Final   Special Requests Normal  Final   Gram Stain   Final    RARE WBC PRESENT, PREDOMINANTLY PMN ABUNDANT GRAM POSITIVE COCCI    Culture   Final    ABUNDANT STAPHYLOCOCCUS AUREUS MODERATE  STREPTOCOCCUS,BETA HEMOLYTIC NOT GROUP A    Report Status 06/09/2017 FINAL  Final   Organism ID, Bacteria STAPHYLOCOCCUS AUREUS  Final      Susceptibility   Staphylococcus aureus - MIC*    CIPROFLOXACIN <=0.5 SENSITIVE Sensitive     ERYTHROMYCIN 0.5 SENSITIVE Sensitive     GENTAMICIN <=0.5 SENSITIVE Sensitive     OXACILLIN 0.5 SENSITIVE Sensitive     TETRACYCLINE <=1 SENSITIVE Sensitive     VANCOMYCIN <=0.5 SENSITIVE Sensitive     TRIMETH/SULFA <=10 SENSITIVE Sensitive     CLINDAMYCIN <=0.25 SENSITIVE Sensitive     RIFAMPIN <=0.5 SENSITIVE Sensitive     Inducible Clindamycin NEGATIVE Sensitive     * ABUNDANT STAPHYLOCOCCUS AUREUS  Radiology Studies: No results found.      Scheduled Meds: . aspirin  325 mg Per Tube Daily  . chlorhexidine gluconate (MEDLINE KIT)  15 mL Mouth Rinse BID  . cloNIDine  0.3 mg Transdermal Weekly  . clopidogrel  75 mg Per Tube Q breakfast  . docusate  100 mg Oral Daily  . Investigational - Study Medication  20 mg Per Tube TID  . enoxaparin (LOVENOX) injection  40 mg Subcutaneous Q24H  . feeding supplement (PRO-STAT SUGAR FREE 64)  30 mL Per Tube QID  . feeding supplement (VITAL 1.5 CAL)  1,000 mL Per Tube Q24H  . insulin aspart  0-15 Units Subcutaneous Q4H  . insulin aspart  4 Units Subcutaneous Q4H  . insulin glargine  10 Units Subcutaneous Daily  . mouth rinse  15 mL Mouth Rinse QID  . metoprolol tartrate  100 mg Per Tube BID   Continuous Infusions: .  ceFAZolin (ANCEF) IV Stopped (06/13/17 0524)  . clevidipine Stopped (06/03/17 2335)  . dextrose 5 % and 0.9% NaCl 10 mL/hr at 06/13/17 0000  . famotidine (PEPCID) IV Stopped (06/12/17 1700)     LOS: 13 days    Time spent: 40 minutes    Dolorez Jeffrey, Geraldo Docker, MD [facility] Pager [phone]   If 7PM-7AM, please contact night-coverage [email or web address] Password [email or web address] 06/13/2017, 9:11 AM

## 2017-06-13 NOTE — Progress Notes (Signed)
  Speech Language Pathology Treatment: [name] Speaking valve  Patient Details Name: [name] MRN: [id number] DOB: Mar 04, 1951 Today's Date: 06/13/2017 Time: 8527-7824 SLP Time Calculation (min) (ACUTE ONLY): 20 min  Assessment / Plan / Recommendation Clinical Impression  Pt seen for f/u for PMSV toleration. Pt alert, interactive, daughter reports attempting to communicate this morning. RN reports frequent suctioning of thick, tan secretions. Cuff at 0cm pressure upon arrival, RN just finished tracheal suctioning. At baseline pt SpO2 99, pulse 86, respiratory rate 13-17. With valve placement, pt states "I'm hungry" with hoarse, wet vocal quality. With mod cues, able to cough and expectorate secretions orally. SLP provided cues for breath/phonation coordination and increased vocal intensity for phrase and sentence level speech tasks. At 1 min, valve removed with no overt signs of air trapping, pt's vitals remained stable. Noted back pressure with removal at 5 min, suggestive of air trapping. In subsequent 5 min trial, pt's respirations increased to 24, pt coughed, dislodging valve. Provided education to patient, daughter re: valve use, barriers to toleration including thick, copious secretions, swallow evaluation as pt is able to tolerate PMSV. Will continue to follow closely.    HPI HPI: 66yo female with hx HTN, DM who presented initially 8/13 to her PCP with headache, R sided weakness and vision changes. She was sent for MRI which revealed multiple areas of acute L MCA infarct. She was sent to ER and admitted for CVA workup. On 8/16 pt was in cath lab for diagnostic carotid and cerebral angiogram with vascular surgery when she became suddenly unresponsive with L sided weakness and possible L sided seizure-like activity. CTA revealed bilateral M1 occlusions. She was intubated and taken urgently to IR for revascularization and rapid resolution of embolism. ETT 8/16-8/20, 8/20-8/23, trach  8/23, pt tolerated trach collar overnight.      SLP Plan  Continue with current plan of care       Recommendations         Patient may use Passy-Muir Speech Valve: with SLP only PMSV Supervision: Full MD: Please consider changing trach tube to : Smaller size;Cuffless         Oral Care Recommendations: Oral care QID Follow up Recommendations:  (TBD) SLP Visit Diagnosis: Aphonia (R49.1) Plan: Continue with current plan of care       [place], [place], [place] Pathologist [phone]  Aliene Altes 06/13/2017, 12:38 PM

## 2017-06-13 NOTE — Progress Notes (Signed)
Physical Therapy Treatment Patient Details Name: [name] MRN: [id number] DOB: 03/10/1951 Today's Date: 06/13/2017    History of Present Illness Pt is a 66 y/o female with hx HTN, DM. She presented initially 8/13 to her PCP with headache, R hight weakness and vision changes. She was sent for MRI which revealed multiple areas of acute L MCA infarct. She was sent to ER and admitted for CVA workup. On 8/16 pt was in cath lab for diagnostic carotid and cerebral angiogram with vascular surgery when she became suddenly unresponsive with L sided weakness and possible L sided seizure-like activity. CTA revealed bilateral M1 occlusions. She was intubated and taken urgently to IR for revascularization and rapid resolution of embolism.    PT Comments    Patient seen for mobility progression/ OOB. Patient tolerated EOB with assist and transfer OOB to chair. Patient continues to require 2 person moderate to maximize assist for all aspects of mobility. Will continue to see and progress as tolerated.   Follow Up Recommendations  CIR;Supervision/Assistance - 24 hour     Equipment Recommendations  Other (comment) (TBD)    Recommendations for Other Services Rehab consult     Precautions / Restrictions Precautions Precautions: Fall Precaution Comments: NG tube, trach Restrictions Weight Bearing Restrictions: No    Mobility  Bed Mobility Overal bed mobility: Needs Assistance Bed Mobility: Supine to Sit     Supine to sit: Max assist;+2 for physical assistance     General bed mobility comments: Patient able to move LEs to EOB, +2 max assist to elevate trunk and rotate to EOB. increased time and effort to perform. patient with posterior bias throughout  Transfers Overall transfer level: Needs assistance Equipment used: None Transfers: Sit to/from [employer] Sit to Stand: Total assist;+2 physical assistance;+2 safety/equipment   Squat pivot transfers: Total assist;+2  physical assistance     General transfer comment: Patient attempting to intiate power up but required total assist to block bilateral LEs and transition trunk   Ambulation/Gait             General Gait Details: Unable   Stairs            Wheelchair Mobility    Modified Rankin (Stroke Patients Only) Modified Rankin (Stroke Patients Only) Pre-Morbid Rankin Score: No symptoms Modified Rankin: Severe disability     Balance Overall balance assessment: Needs assistance Sitting-balance support: Feet supported;Bilateral upper extremity supported Sitting balance-Leahy Scale: Zero Sitting balance - Comments: moderate to max assist to maintain sitting balance throughout EOB Postural control: Posterior lean;Left lateral lean Standing balance support: Bilateral upper extremity supported Standing balance-Leahy Scale: Zero Standing balance comment: total assist for attempts to stand, unable to clear to upright                            Cognition Arousal/Alertness: Awake/alert Behavior During Therapy: Restless Overall Cognitive Status: Difficult to assess                                 General Comments: Folowing simple commands with improved consistency      Exercises General Exercises - Lower Extremity Ankle Circles/Pumps: PROM;Both;5 reps;Supine    General Comments General comments (skin integrity, edema, etc.): VSS during session      Pertinent Vitals/Pain Pain Assessment: Faces Faces Pain Scale: Hurts even more Pain Location: trach site Pain Descriptors / Indicators: Discomfort;Grimacing;Guarding Pain  Intervention(s): Monitored during session    Home Living                      Prior Function            PT Goals (current goals can now be found in the care plan section) Acute Rehab PT Goals Patient Stated Goal: none stated PT Goal Formulation: With patient Time For Goal Achievement: 06/15/17 Potential to Achieve  Goals: Fair Progress towards PT goals: Progressing toward goals    Frequency    Min 3X/week      PT Plan Current plan remains appropriate    Co-evaluation PT/OT/SLP Co-Evaluation/Treatment: Yes            AM-PAC PT "6 Clicks" Daily Activity  Outcome Measure  Difficulty turning over in bed (including adjusting bedclothes, sheets and blankets)?: Unable Difficulty moving from lying on back to sitting on the side of the bed? : Unable Difficulty sitting down on and standing up from a chair with arms (e.g., wheelchair, bedside commode, etc,.)?: Unable Help needed moving to and from a bed to chair (including a wheelchair)?: Total Help needed walking in hospital room?: Total Help needed climbing 3-5 steps with a railing? : Total 6 Click Score: 6    End of Session Equipment Utilized During Treatment: Oxygen Activity Tolerance: Patient limited by lethargy Patient left: with call bell/phone within reach;with nursing/sitter in room;in chair;with chair alarm set Nurse Communication: Mobility status PT Visit Diagnosis: Difficulty in walking, not elsewhere classified (R26.2);Unsteadiness on feet (R26.81);Hemiplegia and hemiparesis Hemiplegia - Right/Left: Right Hemiplegia - dominant/non-dominant: Dominant Hemiplegia - caused by: Cerebral infarction     Time: 5883-2549 PT Time Calculation (min) (ACUTE ONLY): 28 min  Charges:  $Therapeutic Activity: 8-22 mins                    G Codes:       Alben Deeds, PT DPT  Board Certified Neurologic Specialist [phone]    Duncan Dull 06/13/2017, 11:32 AM

## 2017-06-13 NOTE — Progress Notes (Signed)
Foley Catheter placed per MD order for acute urinary retention. Foley placed by sterile technique with two RN's present. Peri care performed prior to insertion and  Foley care performed post insertion. Patient tolerated procedure well.

## 2017-06-14 ENCOUNTER — Inpatient Hospital Stay (HOSPITAL_COMMUNITY): Payer: Medicare Other

## 2017-06-14 DIAGNOSIS — D62 Acute posthemorrhagic anemia: Secondary | ICD-10-CM

## 2017-06-14 DIAGNOSIS — E1142 Type 2 diabetes mellitus with diabetic polyneuropathy: Secondary | ICD-10-CM

## 2017-06-14 DIAGNOSIS — G8918 Other acute postprocedural pain: Secondary | ICD-10-CM

## 2017-06-14 DIAGNOSIS — D72829 Elevated white blood cell count, unspecified: Secondary | ICD-10-CM

## 2017-06-14 DIAGNOSIS — R0902 Hypoxemia: Secondary | ICD-10-CM

## 2017-06-14 DIAGNOSIS — G932 Benign intracranial hypertension: Secondary | ICD-10-CM

## 2017-06-14 DIAGNOSIS — J9690 Respiratory failure, unspecified, unspecified whether with hypoxia or hypercapnia: Secondary | ICD-10-CM

## 2017-06-14 DIAGNOSIS — H9201 Otalgia, right ear: Secondary | ICD-10-CM

## 2017-06-14 DIAGNOSIS — R1310 Dysphagia, unspecified: Secondary | ICD-10-CM

## 2017-06-14 DIAGNOSIS — I639 Cerebral infarction, unspecified: Secondary | ICD-10-CM

## 2017-06-14 DIAGNOSIS — I10 Essential (primary) hypertension: Secondary | ICD-10-CM

## 2017-06-14 DIAGNOSIS — Z789 Other specified health status: Secondary | ICD-10-CM

## 2017-06-14 DIAGNOSIS — E876 Hypokalemia: Secondary | ICD-10-CM

## 2017-06-14 DIAGNOSIS — R569 Unspecified convulsions: Secondary | ICD-10-CM

## 2017-06-14 DIAGNOSIS — Z4659 Encounter for fitting and adjustment of other gastrointestinal appliance and device: Secondary | ICD-10-CM

## 2017-06-14 LAB — BASIC METABOLIC PANEL
ANION GAP: 9 (ref 5–15)
BUN: 40 mg/dL — ABNORMAL HIGH (ref 6–20)
CHLORIDE: 105 mmol/L (ref 101–111)
CO2: 27 mmol/L (ref 22–32)
Calcium: 8.5 mg/dL — ABNORMAL LOW (ref 8.9–10.3)
Creatinine, Ser: 0.88 mg/dL (ref 0.44–1.00)
GFR calc Af Amer: >60 mL/min (ref >60)
GLUCOSE: 78 mg/dL (ref 65–99)
POTASSIUM: 3 mmol/L — AB (ref 3.5–5.1)
Sodium: 141 mmol/L (ref 135–145)

## 2017-06-14 LAB — CBC
HEMATOCRIT: 26.5 % — AB (ref 36.0–46.0)
HEMOGLOBIN: 8.1 g/dL — AB (ref 12.0–15.0)
MCH: 27.4 pg (ref 26.0–34.0)
MCHC: 30.6 g/dL (ref 30.0–36.0)
MCV: 89.5 fL (ref 78.0–100.0)
PLATELETS: 405 10*3/uL — AB (ref 150–400)
RBC: 2.96 MIL/uL — AB (ref 3.87–5.11)
RDW: 14.5 % (ref 11.5–15.5)
WBC: 11 10*3/uL — AB (ref 4.0–10.5)

## 2017-06-14 LAB — GLUCOSE, CAPILLARY
GLUCOSE-CAPILLARY: 119 mg/dL — AB (ref 65–99)
GLUCOSE-CAPILLARY: 125 mg/dL — AB (ref 65–99)
GLUCOSE-CAPILLARY: 140 mg/dL — AB (ref 65–99)
GLUCOSE-CAPILLARY: 155 mg/dL — AB (ref 65–99)
GLUCOSE-CAPILLARY: 168 mg/dL — AB (ref 65–99)
Glucose-Capillary: 65 mg/dL (ref 65–99)
Glucose-Capillary: 57 mg/dL — ABNORMAL LOW (ref 65–99)
Glucose-Capillary: 267 mg/dL — ABNORMAL HIGH (ref 65–99)

## 2017-06-14 MED ORDER — ONDANSETRON HCL 4 MG/2ML IJ SOLN
4.0000 mg | Freq: Four times a day (QID) | INTRAMUSCULAR | Status: DC | PRN
  Administered 2017-06-14: 4 mg via INTRAVENOUS
  Filled 2017-06-14: qty 2

## 2017-06-14 MED ORDER — LORAZEPAM 2 MG/ML IJ SOLN
0.5000 mg | Freq: Three times a day (TID) | INTRAMUSCULAR | Status: DC | PRN
  Administered 2017-06-15: 0.5 mg via INTRAVENOUS
  Filled 2017-06-14: qty 1

## 2017-06-14 MED ORDER — DIPHENHYDRAMINE HCL 50 MG/ML IJ SOLN
25.0000 mg | Freq: Four times a day (QID) | INTRAMUSCULAR | Status: DC | PRN
  Administered 2017-06-15 – 2017-06-18 (×3): 25 mg via INTRAVENOUS
  Filled 2017-06-14 (×3): qty 1

## 2017-06-14 MED ORDER — ACETYLCYSTEINE 20 % IN SOLN
2.0000 mL | RESPIRATORY_TRACT | Status: DC
  Administered 2017-06-14 – 2017-06-15 (×4): 2 mL via RESPIRATORY_TRACT
  Filled 2017-06-14 (×3): qty 4

## 2017-06-14 MED ORDER — CIPROFLOXACIN-DEXAMETHASONE 0.3-0.1 % OT SUSP
4.0000 [drp] | Freq: Two times a day (BID) | OTIC | Status: DC
  Administered 2017-06-14 – 2017-06-18 (×9): 4 [drp] via OTIC
  Filled 2017-06-14 (×2): qty 7.5

## 2017-06-14 MED ORDER — DEXTROSE 50 % IV SOLN
INTRAVENOUS | Status: AC
  Administered 2017-06-14: 50 mL
  Filled 2017-06-14: qty 50

## 2017-06-14 MED ORDER — POTASSIUM CHLORIDE 20 MEQ PO PACK
40.0000 meq | PACK | ORAL | Status: AC
  Administered 2017-06-14 (×2): 40 meq
  Filled 2017-06-14 (×2): qty 2

## 2017-06-14 MED ORDER — KETOROLAC TROMETHAMINE 15 MG/ML IJ SOLN
15.0000 mg | Freq: Three times a day (TID) | INTRAMUSCULAR | Status: DC | PRN
  Administered 2017-06-14 – 2017-06-16 (×6): 15 mg via INTRAVENOUS
  Filled 2017-06-14 (×6): qty 1

## 2017-06-14 NOTE — Progress Notes (Addendum)
PROGRESS NOTE                                                                                                                                                                                                             Patient Demographics:    [name], is a 66 y.o. female, DOB - [birth year], FUX:[id number]  Admit date - 05/31/2017   Admitting Physician Jani Gravel, MD  Outpatient Primary MD for the patient is [name], MD  LOS - 14  Outpatient Specialists:  Chief Complaint  Patient presents with  . Headache  . 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 Patient was taken to Cath Lab for diagnostic carotid and cerebral angiogram with vascular surgery when she became unresponsive with left-sided weakness and possible seizure-like activity. CT angiogram showed bilateral M1 occlusion patient was intubated and urgently taken by IR for revascularization with rapid resolution of embolism. Hospital course prolonged with acute encephalopathy requiring prolonged intubation followed by tracheostomy. patient transferred to hospitalist service.    Subjective:   Patient complaining of right-sided ear pain and some headache. Also reports nausea   Assessment  & Plan :   Principal problem Bilateral MCA thrombosis Underwent bilateral mechanical embolectomy with revascularization. No seizure activity on EEG. Suspect arterial embolus phenomenon with bilateral supra and infratentorial infarcts on MRI. Now on aspirin and Plavix. 2-D  echo with normal EF and no wall motion abnormality. CT angiogram of the head and neck shows 60-65% stenosis of left internal carotid artery (40-59% on ultrasound) LDL of 211, intolerant to statin. Recommend follow up with PCP Dr. Felipa Eth for consideration of PCSK9 inhibitor.  A1c of 9.5, on sliding scale coverage. Residual right hand weakness. Seen by CIR and recommends LTAC.  Continue aggressive secondary stroke prevention.  acute hypoxic respiratory failure Prolonged intubation status post tracheostomy on 8/23. Not tolerating trach collar.Having copious secretions. Continue hydration and nebs.  essential hypertension Stable. Continue metoprolol and clonidine patch  Uncontrolled type 2 DM  A1C of 9.5. On bedtime Lantus and pre-meal aspart. Continue sliding scale coverage.  Right ear pain Has bulging tympanic membrane on exam with some erythema. Pain control  with NSAIDs. Add Ciprodex drops.  Dysphagia/ Protein calorie malnutrition Continue NG tube feeding. Failed swallow eval with moderate to severe aspiration risk. SLP will reevaluate.  Hypokalemia Replenish  Acute urinary retention On Foley.    Family Communication  : daughter at bedside  Disposition Plan  : needs LTAC  Barriers For Discharge : improving symptoms, disposition.  Consults  :   PC CM Stroke team Vascular surgery iR  Procedures  :  MRI brain 2 CT angiogram head 2-D echo EEG  LINES / TUBES:  ETT 8/16>>>8/20>>>8/23 R rad aline 8/16>>>8/17 Tracheostomy 8/23>>>   DVT Prophylaxis  :  Lovenox -   Lab Results  Component Value Date   PLT 405 (H) 06/14/2017    Antibiotics  :    Anti-infectives    Start     Dose/Rate Route Frequency Ordered Stop   06/09/17 1200  ceFAZolin (ANCEF) IVPB 2g/100 mL premix     2 g 200 mL/hr over 30 Minutes Intravenous Every 8 hours 06/09/17 1058     06/03/17 1126  vancomycin (VANCOCIN) 1-5 GM/200ML-% IVPB    Comments:  Duininck, Stacey   : cabinet override       06/03/17 1126 06/03/17 2344        Objective:   Vitals:   06/14/17 0828 06/14/17 1115 06/14/17 1227 06/14/17 1416  BP: (!) 151/63 (!) 107/47 (!) 114/57   Pulse: 93 77 78   Resp: (!) 22 16 19    Temp: 98.6 F (37 C)  98.9 F (37.2 C)   TempSrc: Oral  Oral   SpO2: 96% 100% 100% 100%  Weight:      Height:        Wt Readings from Last 3 Encounters:  06/14/17 75.5 kg (166 lb 7.2 oz)  01/15/16 79.3 kg (174 lb 12.8 oz)  09/02/15 79.4 kg (175 lb)     Intake/Output Summary (Last 24 hours) at 06/14/17 1437 Last data filed at 06/14/17 1333  Gross per 24 hour  Intake              649 ml  Output             1325 ml  Net             -676 ml     Physical Exam  Gen: elderly female appears fatigued,  HEENT: pallor present, NG tube place, trach collar with increased secretion, bulging right tympanic membrane with erythema Chest: clear b/l, no added sounds CVS: N S1&S2, no murmurs,  GI: soft, NT, ND, BS+ Musculoskeletal: warm, no edema, Foley+ CNS: alert and awake, impaired speech rt UE weakness 4/5 power    Data Review:    CBC  Recent Labs Lab 06/10/17 0630 06/13/17 0250 06/14/17 0309  WBC 15.3* 12.5* 11.0*  HGB 8.9* 9.6* 8.1*  HCT 27.2* 30.5* 26.5*  PLT 362 480* 405*  MCV 88.3 89.4 89.5  MCH 28.9 28.2 27.4  MCHC 32.7 31.5 30.6  RDW 14.9 14.7 14.5    Chemistries   Recent Labs Lab 06/09/17 2346 06/10/17 0630 06/12/17 0530 06/13/17 0250 06/14/17 0309  NA 142 140 140 144 141  K 3.2* 3.7 2.9* 4.1 3.0*  CL 108 107 97* 105 105  CO2 25 26 30 28 27   GLUCOSE 177* 152* 209* 67 78  BUN 31* 29* 36* 36* 40*  CREATININE 0.85 0.86 0.83 0.82 0.88  CALCIUM 8.4* 8.2* 8.6* 9.1 8.5*  MG 1.8 1.8  --  2.2  --    ------------------------------------------------------------------------------------------------------------------ No results for input(s): CHOL, HDL, LDLCALC, TRIG, CHOLHDL, LDLDIRECT in the last 72 hours.  Lab Results  Component Value Date   HGBA1C 9.5  (H) 06/02/2017   ------------------------------------------------------------------------------------------------------------------ No results for input(s): TSH, T4TOTAL, T3FREE, THYROIDAB in the last 72 hours.  Invalid input(s): FREET3 ------------------------------------------------------------------------------------------------------------------ No results for input(s): VITAMINB12, FOLATE, FERRITIN, TIBC, IRON, RETICCTPCT in the last 72 hours.  Coagulation profile  Recent Labs Lab 06/10/17 0630 06/10/17 0808  INR QUESTIONABLE RESULTS, RECOMMEND RECOLLECT TO VERIFY 0.99    No results for input(s): DDIMER in the last 72 hours.  Cardiac Enzymes No results for input(s): CKMB, TROPONINI, MYOGLOBIN in the last 168 hours.  Invalid input(s): CK ------------------------------------------------------------------------------------------------------------------ No results found for: BNP  Inpatient Medications  Scheduled Meds: . acetylcysteine  2 mL Nebulization Q4H  . aspirin  325 mg Per Tube Daily  . chlorhexidine gluconate (MEDLINE KIT)  15 mL Mouth Rinse BID  . ciprofloxacin-dexamethasone  4 drop Right EAR BID  . cloNIDine  0.3 mg Transdermal Weekly  . clopidogrel  75 mg Per Tube Q breakfast  . docusate  100 mg Oral Daily  . enoxaparin (LOVENOX) injection  40 mg Subcutaneous Q24H  . feeding supplement (PRO-STAT SUGAR FREE 64)  30 mL Per Tube QID  . feeding supplement (VITAL 1.5 CAL)  1,000 mL Per Tube Q24H  . insulin aspart  0-15 Units Subcutaneous Q4H  . insulin aspart  4 Units Subcutaneous Q4H  . insulin glargine  12 Units Subcutaneous Daily  . Investigational - Study Medication  20 mg Per Tube TID  . ipratropium-albuterol  3 mL Nebulization Q6H  . mouth rinse  15 mL Mouth Rinse QID  . metoprolol tartrate  100 mg Per Tube BID   Continuous Infusions: . sodium chloride 50 mL/hr at 06/14/17 1218  .  ceFAZolin (ANCEF) IV 2 g (06/14/17 1220)  . famotidine (PEPCID) IV  Stopped (06/13/17 1618)   PRN Meds:.acetaminophen **OR** acetaminophen (TYLENOL) oral liquid 160 mg/5 mL **OR** acetaminophen, diclofenac sodium, fentaNYL (SUBLIMAZE) injection, ketorolac, labetalol, LORazepam, ondansetron (ZOFRAN) IV, pantoprazole, phenol  Micro Results Recent Results (from the past 240 hour(s))  Culture, respiratory (NON-Expectorated)     Status: None   Collection Time: 06/07/17 11:58 AM  Result Value Ref Range Status   Specimen Description TRACHEAL ASPIRATE  Final   Special Requests Normal  Final   Gram Stain   Final    RARE WBC PRESENT, PREDOMINANTLY PMN ABUNDANT GRAM POSITIVE COCCI    Culture   Final    ABUNDANT STAPHYLOCOCCUS AUREUS MODERATE STREPTOCOCCUS,BETA HEMOLYTIC NOT GROUP A    Report Status 06/09/2017 FINAL  Final   Organism ID, Bacteria STAPHYLOCOCCUS AUREUS  Final      Susceptibility   Staphylococcus aureus - MIC*    CIPROFLOXACIN <=0.5 SENSITIVE Sensitive     ERYTHROMYCIN 0.5 SENSITIVE Sensitive     GENTAMICIN <=0.5 SENSITIVE Sensitive     OXACILLIN 0.5 SENSITIVE Sensitive     TETRACYCLINE <=1 SENSITIVE Sensitive     VANCOMYCIN <=0.5 SENSITIVE Sensitive     TRIMETH/SULFA <=10 SENSITIVE Sensitive     CLINDAMYCIN <=0.25 SENSITIVE Sensitive     RIFAMPIN <=0.5 SENSITIVE Sensitive     Inducible Clindamycin NEGATIVE Sensitive     * ABUNDANT STAPHYLOCOCCUS AUREUS    Radiology Reports Ct Angio Head W  Or Wo Contrast  Result Date: 06/03/2017 CLINICAL DATA:  Upper extremity deficits and unresponsiveness. Carotid catheterization. EXAM: CT ANGIOGRAPHY HEAD AND NECK TECHNIQUE: Multidetector CT imaging of the head and neck was performed using the standard protocol during bolus administration of intravenous contrast. Multiplanar CT image reconstructions and MIPs were obtained to evaluate the vascular anatomy. Carotid stenosis measurements (when applicable) are obtained utilizing NASCET criteria, using the distal internal carotid diameter as the denominator.  CONTRAST:  50 cc Isovue 370 intravenous COMPARISON:  CTA 3 days ago FINDINGS: CTA NECK FINDINGS Aortic arch: Mild atheromatous wall thickening and calcification. No ulceration. Three vessel branching. Right carotid system: Moderate calcified and noncalcified plaque on the posterior wall of the ICA bulb. No flow limiting stenosis. No ulceration or dissection. Left carotid system: Prominent proximally calcified and distally noncalcified atheromatous plaque on the ICA bulb with approximately 50-60% stenosis. The noncalcified plaque portion shows positive remodeling. Negative for dissection or interval ulceration. Vertebral arteries: No proximal subclavian stenosis. Mild left vertebral artery dominance. The vertebral arteries are widely patent to the dura. Skeleton: No acute or aggressive finding Other neck: No incidental mass or inflammation. Upper chest: No acute finding. Review of the MIP images confirms the above findings CTA HEAD FINDINGS Anterior circulation: Bilateral calcified plaque on the carotid siphons as seen previously. There is a mild supraclinoid ICA narrowing on the left. Luminal filling defect at the right M1 2 junction, over riding the anterior temporal branch which is nonenhancing. There is prompt downstream flow suggesting subtotal occlusion. There is in the elongated luminal clot within the left M1 extending into the main M2 branch with diminished flow but subtotal occlusion. Posterior circulation: Left dominant vertebral artery. Moderate proximal right V4 and mid basilar stenoses. Mild moderate atherosclerotic irregularity of bilateral PCA branches without branch occlusion. Fetal type right PCA. Venous sinuses: Patent as permitted by contrast timing Anatomic variants: Fetal type right PCA Delayed phase: Not obtained in the emergent setting. Images were reviewed in person with Dr. Erlinda Hong as the data set was being transported to PACs. Review of the MIP images confirms the above findings IMPRESSION: 1.  Right M1 embolus with subtotal occlusion but severe stenosis. The anterior temporal branch is covered and non-opacified. 2. Elongated embolus within the left M1 and proximal M2 vessels. 3. Negative for dissection or interval ulceration in the arch and cervical carotids. 4. Carotid bifurcation atherosclerosis with approximately 50-60% left ICA bulb narrowing. Visually the stenosis appears worse due to positive remodeling at the low-density plaque. 5. Moderate atheromatous narrowing of the right V4 and mid basilar. Electronically Signed   By: Monte Fantasia M.D.   On: 06/03/2017 11:06   Ct Angio Head W Or Wo Contrast  Result Date: 05/31/2017 CLINICAL DATA:  Headache and right arm weakness EXAM: CT ANGIOGRAPHY HEAD AND NECK TECHNIQUE: Multidetector CT imaging of the head and neck was performed using the standard protocol during bolus administration of intravenous contrast. Multiplanar CT image reconstructions and MIPs were obtained to evaluate the vascular anatomy. Carotid stenosis measurements (when applicable) are obtained utilizing NASCET criteria, using the distal internal carotid diameter as the denominator. CONTRAST:  50 mL Isovue 370 COMPARISON:  None. FINDINGS: CT HEAD FINDINGS Brain: No mass lesion, intraparenchymal hemorrhage or extra-axial collection. No evidence of acute cortical infarct. Brain parenchyma and CSF-containing spaces are normal for age. Vascular: No hyperdense vessel or unexpected calcification. Skull: Normal visualized skull base, calvarium and extracranial soft tissues. Sinuses/Orbits: No sinus fluid levels or advanced mucosal thickening. No mastoid effusion.  Normal orbits. Review of the MIP images confirms the above findings CTA NECK FINDINGS Aortic arch: There is no aneurysm or dissection of the visualized ascending aorta or aortic arch. Normal 3 vessel aortic branching pattern. The visualized proximal subclavian arteries are normal. Mild calcific aortic atherosclerosis. Right carotid  system: The right common carotid origin is widely patent. There is no common carotid or internal carotid artery dissection or aneurysm. There is mixed calcified and noncalcified plaque at the right carotid bifurcation without hemodynamically significant stenosis. Left carotid system: There is predominantly noncalcified plaque within the proximal left internal carotid artery. This results in stenosis that measures 60% by NASCET criteria. Subjectively, the stenosis appears severe. Vertebral arteries: The vertebral system is left dominant. Both vertebral artery origins are normal. Both vertebral arteries are normal to their confluence with the basilar artery. Skeleton: There is no bony spinal canal stenosis. No lytic or blastic lesions. Other neck: The nasopharynx is clear. The oropharynx and hypopharynx are normal. The epiglottis is normal. The supraglottic larynx, glottis and subglottic larynx are normal. No retropharyngeal collection. The parapharyngeal spaces are preserved. The parotid and submandibular glands are normal. No sialolithiasis or salivary ductal dilatation. The thyroid gland is normal. There is no cervical lymphadenopathy. Upper chest: No pneumothorax or pleural effusion. No nodules or masses. Review of the MIP images confirms the above findings CTA HEAD FINDINGS Anterior circulation: --Intracranial internal carotid arteries: Normal. --Anterior cerebral arteries: Normal. --Middle cerebral arteries: Normal. --Posterior communicating arteries: Present bilaterally. Posterior circulation: --Posterior cerebral arteries: Normal. --Superior cerebellar arteries: Normal. --Basilar artery: Normal. --Anterior inferior cerebellar arteries: Normal. --Posterior inferior cerebellar arteries: Normal. Venous sinuses: As permitted by contrast timing, patent. Anatomic variants: None Delayed phase: No parenchymal contrast enhancement. Review of the MIP images confirms the above findings IMPRESSION: 1. No emergent large  vessel occlusion. 2. Large amount of noncalcified plaque at the proximal left internal carotid artery, causing 60-65% stenosis. This is the most likely source of emboli for the previously identified left parietal and occipital infarcts. 3. No other intracranial or cervical arterial stenosis. 4. Minimal Aortic Atherosclerosis (ICD10-I70.0). Electronically Signed   By: Ulyses Jarred M.D.   On: 05/31/2017 21:59   Dg Chest 2 View  Result Date: 06/01/2017 CLINICAL DATA:  Stroke.  Headache and right-sided weakness. EXAM: CHEST  2 VIEW COMPARISON:  Radiographs 06/18/2016 FINDINGS: The cardiomediastinal contours are unchanged, heart size upper limits normal. The lungs are clear. Pulmonary vasculature is normal. No consolidation, pleural effusion, or pneumothorax. No acute osseous abnormalities are seen. Shunt catheter tubing noted in the right upper quadrant of the abdomen. IMPRESSION: No active cardiopulmonary disease. Electronically Signed   By: Jeb Levering M.D.   On: 06/01/2017 01:05   Ct Head Wo Contrast  Result Date: 06/03/2017 CLINICAL DATA:  66 y/o  F; status posts IR intervention for stroke. EXAM: CT HEAD WITHOUT CONTRAST TECHNIQUE: Contiguous axial images were obtained from the base of the skull through the vertex without intravenous contrast. COMPARISON:  CT head and CTA head dated 06/03/2017. MRI head dated 05/30/2017. FINDINGS: Brain: Persisting contrast from recent intervention. There are several foci of enhancement present within bilateral parietal and left frontal cortex as well as within the left basal ganglia likely representing enhancing infarcts. No large hemorrhage identified, a small hemorrhage may be obscured by the presence of contrast. No focal mass effect, herniation, or extra-axial collection. Background of Vascular: Central circle of Jannifer Franklin is enhancing as are the large dural venous sinuses. Skull: Normal. Negative for fracture or focal  lesion. Sinuses/Orbits: No acute finding. Other:  None. IMPRESSION: 1. Several small foci of enhancement within bilateral parietal and left frontal cortex as well as left basal ganglia probably representing enhancing infarctions. 2. No large hemorrhage, focal mass effect, herniation, or hydrocephalus. Electronically Signed   By: Kristine Garbe M.D.   On: 06/03/2017 13:56   Ct Angio Neck W Or Wo Contrast  Result Date: 06/03/2017 CLINICAL DATA:  Upper extremity deficits and unresponsiveness. Carotid catheterization. EXAM: CT ANGIOGRAPHY HEAD AND NECK TECHNIQUE: Multidetector CT imaging of the head and neck was performed using the standard protocol during bolus administration of intravenous contrast. Multiplanar CT image reconstructions and MIPs were obtained to evaluate the vascular anatomy. Carotid stenosis measurements (when applicable) are obtained utilizing NASCET criteria, using the distal internal carotid diameter as the denominator. CONTRAST:  50 cc Isovue 370 intravenous COMPARISON:  CTA 3 days ago FINDINGS: CTA NECK FINDINGS Aortic arch: Mild atheromatous wall thickening and calcification. No ulceration. Three vessel branching. Right carotid system: Moderate calcified and noncalcified plaque on the posterior wall of the ICA bulb. No flow limiting stenosis. No ulceration or dissection. Left carotid system: Prominent proximally calcified and distally noncalcified atheromatous plaque on the ICA bulb with approximately 50-60% stenosis. The noncalcified plaque portion shows positive remodeling. Negative for dissection or interval ulceration. Vertebral arteries: No proximal subclavian stenosis. Mild left vertebral artery dominance. The vertebral arteries are widely patent to the dura. Skeleton: No acute or aggressive finding Other neck: No incidental mass or inflammation. Upper chest: No acute finding. Review of the MIP images confirms the above findings CTA HEAD FINDINGS Anterior circulation: Bilateral calcified plaque on the carotid siphons as seen  previously. There is a mild supraclinoid ICA narrowing on the left. Luminal filling defect at the right M1 2 junction, over riding the anterior temporal branch which is nonenhancing. There is prompt downstream flow suggesting subtotal occlusion. There is in the elongated luminal clot within the left M1 extending into the main M2 branch with diminished flow but subtotal occlusion. Posterior circulation: Left dominant vertebral artery. Moderate proximal right V4 and mid basilar stenoses. Mild moderate atherosclerotic irregularity of bilateral PCA branches without branch occlusion. Fetal type right PCA. Venous sinuses: Patent as permitted by contrast timing Anatomic variants: Fetal type right PCA Delayed phase: Not obtained in the emergent setting. Images were reviewed in person with Dr. Erlinda Hong as the data set was being transported to PACs. Review of the MIP images confirms the above findings IMPRESSION: 1. Right M1 embolus with subtotal occlusion but severe stenosis. The anterior temporal branch is covered and non-opacified. 2. Elongated embolus within the left M1 and proximal M2 vessels. 3. Negative for dissection or interval ulceration in the arch and cervical carotids. 4. Carotid bifurcation atherosclerosis with approximately 50-60% left ICA bulb narrowing. Visually the stenosis appears worse due to positive remodeling at the low-density plaque. 5. Moderate atheromatous narrowing of the right V4 and mid basilar. Electronically Signed   By: Monte Fantasia M.D.   On: 06/03/2017 11:06   Ct Angio Neck W And/or Wo Contrast  Result Date: 05/31/2017 CLINICAL DATA:  Headache and right arm weakness EXAM: CT ANGIOGRAPHY HEAD AND NECK TECHNIQUE: Multidetector CT imaging of the head and neck was performed using the standard protocol during bolus administration of intravenous contrast. Multiplanar CT image reconstructions and MIPs were obtained to evaluate the vascular anatomy. Carotid stenosis measurements (when applicable)  are obtained utilizing NASCET criteria, using the distal internal carotid diameter as the denominator. CONTRAST:  50 mL  Isovue 370 COMPARISON:  None. FINDINGS: CT HEAD FINDINGS Brain: No mass lesion, intraparenchymal hemorrhage or extra-axial collection. No evidence of acute cortical infarct. Brain parenchyma and CSF-containing spaces are normal for age. Vascular: No hyperdense vessel or unexpected calcification. Skull: Normal visualized skull base, calvarium and extracranial soft tissues. Sinuses/Orbits: No sinus fluid levels or advanced mucosal thickening. No mastoid effusion. Normal orbits. Review of the MIP images confirms the above findings CTA NECK FINDINGS Aortic arch: There is no aneurysm or dissection of the visualized ascending aorta or aortic arch. Normal 3 vessel aortic branching pattern. The visualized proximal subclavian arteries are normal. Mild calcific aortic atherosclerosis. Right carotid system: The right common carotid origin is widely patent. There is no common carotid or internal carotid artery dissection or aneurysm. There is mixed calcified and noncalcified plaque at the right carotid bifurcation without hemodynamically significant stenosis. Left carotid system: There is predominantly noncalcified plaque within the proximal left internal carotid artery. This results in stenosis that measures 60% by NASCET criteria. Subjectively, the stenosis appears severe. Vertebral arteries: The vertebral system is left dominant. Both vertebral artery origins are normal. Both vertebral arteries are normal to their confluence with the basilar artery. Skeleton: There is no bony spinal canal stenosis. No lytic or blastic lesions. Other neck: The nasopharynx is clear. The oropharynx and hypopharynx are normal. The epiglottis is normal. The supraglottic larynx, glottis and subglottic larynx are normal. No retropharyngeal collection. The parapharyngeal spaces are preserved. The parotid and submandibular glands  are normal. No sialolithiasis or salivary ductal dilatation. The thyroid gland is normal. There is no cervical lymphadenopathy. Upper chest: No pneumothorax or pleural effusion. No nodules or masses. Review of the MIP images confirms the above findings CTA HEAD FINDINGS Anterior circulation: --Intracranial internal carotid arteries: Normal. --Anterior cerebral arteries: Normal. --Middle cerebral arteries: Normal. --Posterior communicating arteries: Present bilaterally. Posterior circulation: --Posterior cerebral arteries: Normal. --Superior cerebellar arteries: Normal. --Basilar artery: Normal. --Anterior inferior cerebellar arteries: Normal. --Posterior inferior cerebellar arteries: Normal. Venous sinuses: As permitted by contrast timing, patent. Anatomic variants: None Delayed phase: No parenchymal contrast enhancement. Review of the MIP images confirms the above findings IMPRESSION: 1. No emergent large vessel occlusion. 2. Large amount of noncalcified plaque at the proximal left internal carotid artery, causing 60-65% stenosis. This is the most likely source of emboli for the previously identified left parietal and occipital infarcts. 3. No other intracranial or cervical arterial stenosis. 4. Minimal Aortic Atherosclerosis (ICD10-I70.0). Electronically Signed   By: Ulyses Jarred M.D.   On: 05/31/2017 21:59   Mr Brain Wo Contrast  Result Date: 06/04/2017 CLINICAL DATA:  RIGHT handed weakness, and incoordination. Followup stroke. History of pseudotumor cerebral RIGHT, seizures, shunt failure, hypertension, diabetes. EXAM: MRI HEAD WITHOUT CONTRAST TECHNIQUE: Multiplanar, multiecho pulse sequences of the brain and surrounding structures were obtained without intravenous contrast. COMPARISON:  MRI head May 30, 2017 and CT HEAD June 03, 2017 FINDINGS: BRAIN: Numerous new acute infarcts with low ADC values involving bilateral MCA, bilateral PCA territories with low ADC values. New infarcts included bilateral  caudate, bifrontal parietal cortices. New cm LEFT cerebellar infarct. No susceptibility artifact to suggest hemorrhage. Similar patchy supratentorial white matter FLAIR T2 hyperintensities exclusive a aforementioned abnormality. No midline shift, mass effect or masses. Ventricles and sulci are normal for patient's age. No abnormal extra-axial fluid collections. VASCULAR: Normal major intracranial vascular flow voids present at skull base. SKULL AND UPPER CERVICAL SPINE: Empty sella. No suspicious calvarial bone marrow signal. Craniocervical junction maintained. SINUSES/ORBITS: Included paranasal sinus  are well aerated. Trace bilateral mastoid effusions. The included ocular globes and orbital contents are non-suspicious. Status post bilateral ocular lens implants. OTHER: Fluid layering in the oropharynx consistent with intubation. IMPRESSION: 1. 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 No hemorrhagic conversion. 2. Moderate chronic small vessel ischemic disease. These results will be called to the ordering clinician or representative by the Radiologist Assistant, and communication documented in the PACS or zVision Dashboard. Electronically Signed   By: Elon Alas M.D.   On: 06/04/2017 03:47   Mr Jeri Cos ZO Contrast  Result Date: 05/30/2017 CLINICAL DATA:  Right arm weakness. EXAM: MRI HEAD WITHOUT AND WITH CONTRAST TECHNIQUE: Multiplanar, multiecho pulse sequences of the brain and surrounding structures were obtained without and with intravenous contrast. CONTRAST:  16m MULTIHANCE GADOBENATE DIMEGLUMINE 529 MG/ML IV SOLN COMPARISON:  MRI head 01/30/2016 FINDINGS: Brain: Multiple small areas of acute infarction in the left parietal cortex and white matter. Small areas of acute infarct in the left occipital lobe. No other areas of acute infarct. Mild atrophy. Numerous white matter hyperintensities bilaterally compatible  chronic microvascular ischemia. Basal ganglia and brainstem intact. Negative for hemorrhage or mass Normal enhancement postcontrast infusion. Vascular: Normal arterial flow voids Skull and upper cervical spine: Negative Sinuses/Orbits: Bilateral cataract removal.  Paranasal sinuses clear Other: None IMPRESSION: Multiple small areas of acute infarct in the left parietal cortex and left occipital cortex. Recommend CTA head and neck to evaluate for source of emboli or stenosis causing these infarcts Numerous chronic white matter infarcts bilaterally These results will be called to the ordering clinician or representative by the Radiologist Assistant, and communication documented in the PACS or zVision Dashboard. Electronically Signed   By: CFranchot GalloM.D.   On: 05/30/2017 15:30   Dg Chest Port 1 View  Result Date: 06/14/2017 CLINICAL DATA:  Tracheostomy. EXAM: PORTABLE CHEST 1 VIEW COMPARISON:  Radiograph of June 10, 2017. FINDINGS: Stable cardiomediastinal silhouette. Tracheostomy tube and feeding tube are unchanged in position. No pneumothorax is noted. Right lung is clear. Minimal left basilar subsegmental atelectasis with associated pleural effusion is noted. Bony thorax is unremarkable. IMPRESSION: Minimal left basilar subsegmental atelectasis with associated minimal pleural effusion. Electronically Signed   By: JMarijo Conception M.D.   On: 06/14/2017 07:27   Dg Chest Port 1 View  Result Date: 06/10/2017 CLINICAL DATA:  Tracheostomy placement EXAM: PORTABLE CHEST 1 VIEW COMPARISON:  June 07, 2017 FINDINGS: Tracheostomy now present with tip 3.8 cm above the carina. Feeding tube tip is below the diaphragm. There is a catheter either in or overlying the right upper abdominal region. No pneumothorax. There is a small left pleural effusion with left base atelectasis. Lungs elsewhere are clear. Heart is mildly enlarged with pulmonary vascularity within normal limits. No adenopathy. No bone lesions.  IMPRESSION: Tracheostomy now present. Tube positions as described without pneumothorax. Small left pleural effusion with left base atelectasis. Right lung clear. Stable cardiac silhouette. Electronically Signed   By: WLowella GripIII M.D.   On: 06/10/2017 10:57   Portable Chest Xray  Result Date: 06/07/2017 CLINICAL DATA:  Acute respiratory failure with hypoxia. EXAM: PORTABLE CHEST 1 VIEW COMPARISON:  Portable chest x-ray of June 06, 2017 FINDINGS: The lungs are hypoinflated. The retrocardiac region on the left is more dense and there is blunting of the lateral costophrenic angle. The mediastinum is normal in width.The endotracheal tube tip lies at the level of the clavicular heads which is approximately  2.2 cm above the carina. The feeding tube tip projects below the inferior margin of the image. The cardiac silhouette is mildly enlarged but stable. IMPRESSION: Left lower lobe atelectasis or pneumonia worsening since yesterday's study. Probable small left pleural effusion as well. Mild cardiomegaly without significant pulmonary vascular congestion. The support tubes are in reasonable position. Electronically Signed   By: David  Martinique M.D.   On: 06/07/2017 15:01   Dg Chest Port 1 View  Result Date: 06/06/2017 CLINICAL DATA:  Respiratory failure EXAM: PORTABLE CHEST 1 VIEW COMPARISON:  Yesterday FINDINGS: Endotracheal tube tip 2 cm above the carina. An orogastric tube tip or side port is at the stomach. There is no edema, consolidation, or pneumothorax. Trace pleural fluid or atelectasis at the peripheral left base. Cardiomegaly. IMPRESSION: 1. Stable positioning of endotracheal and orogastric tubes. 2. Mild atelectasis or trace pleural fluid at the left base. Electronically Signed   By: Monte Fantasia M.D.   On: 06/06/2017 07:33   Dg Chest Port 1 View  Result Date: 06/05/2017 CLINICAL DATA:  Followup for respiratory failure. EXAM: PORTABLE CHEST 1 VIEW COMPARISON:  06/03/2017 FINDINGS: The  endotracheal tube and nasogastric tube are well positioned. There is opacity at the left lung base obscuring the left hemidiaphragm. This is stable from most recent prior exam. May reflect pneumonia or atelectasis. Remainder of the lungs is clear. No pneumothorax. Cardiac silhouette is normal in size. No mediastinal or hilar masses. IMPRESSION: 1. Lungs appear stable from the most recent prior study. There is persistent opacity in the left lung base which may reflect pneumonia or atelectasis. No new lung abnormalities. 2. Support apparatus is well positioned. Electronically Signed   By: [name] M.D.   On: 06/05/2017 08:27   Dg Chest Portable 1 View  Result Date: 06/03/2017 CLINICAL DATA:  Respiratory failure EXAM: PORTABLE CHEST 1 VIEW COMPARISON:  May 31, 2016 FINDINGS: A new ET tube terminates 1.5 cm above the carina. Recommend withdrawing 1 cm. A new OG tube terminates in the left upper quadrant, likely in the stomach, with the side-port just below the GE junction. New opacity in the left lung base laterally is identified. No pneumothorax. No other interval changes or acute abnormalities. IMPRESSION: 1. The ETT terminates 1.5 cm above the carina. Recommend withdrawing 1 cm. 2. New infiltrate in the lateral left lung base is nonspecific. Recommend attention on follow-up. Electronically Signed   By: Dorise Bullion III M.D   On: 06/03/2017 15:57   Dg Abd Portable 1v  Result Date: 06/14/2017 CLINICAL DATA:  Abdominal pain and constipation. EXAM: PORTABLE ABDOMEN - 1 VIEW COMPARISON:  Lower chest and upper abdominal radiograph of June 04, 2017 FINDINGS: The feeding tube tip projects in the distal duodenum or proximal jejunum. There is no small or large bowel obstructive pattern. The stool and gas pattern within the colon and rectum appears normal. There is a radiodense structure that projects in the region of the anal verge that is of uncertain significance. The bony structures exhibit no acute  abnormalities. IMPRESSION: No evidence of bowel obstruction or ileus. The colonic stool burden does not appear excessive. The feeding tube tip projects in the distal duodenum or proximal jejunum. Electronically Signed   By: David  Martinique M.D.   On: 06/14/2017 09:55   Dg Abd Portable 1v  Result Date: 06/04/2017 CLINICAL DATA:  66 year old female status post feeding tube placement. EXAM: PORTABLE ABDOMEN - 1 VIEW COMPARISON:  Abdominal radiograph 05/25/2017. FINDINGS: Nasogastric tube tip terminating in the  distal body of the stomach. Feeding tube tip projects in the expected location of the antral pre-pyloric region of the stomach or in the duodenal bulb. Visualized bowel gas pattern is unremarkable. Contour abnormality in the periphery of the left lung base favored to reflect the presence of a small pleural effusion. IMPRESSION: 1. Support apparatus, as above. 2. Probable small left pleural effusion. Electronically Signed   By: Vinnie Langton M.D.   On: 06/04/2017 12:28   Dg Abd Portable 1v  Result Date: 06/04/2017 CLINICAL DATA:  OG tube placement. EXAM: PORTABLE ABDOMEN - 1 VIEW COMPARISON:  Scout image for CT scan dated 02/26/2016 FINDINGS: OG tube tip is in the body of the stomach. Bowel gas pattern is normal. No acute bone abnormality. IMPRESSION: OG tube tip in the body of the stomach. Electronically Signed   By: Lorriane Shire M.D.   On: 06/04/2017 10:04   Ir Percutaneous Art Thrombectomy/infusion Intracranial Inc Diag Angio  Result Date: 06/07/2017 INDICATION: Acute mental status changes during diagnostic catheter arteriogram. Bilaterally occluded middle cerebral arteries. EXAM: 1. EMERGENT LARGE VESSEL OCCLUSION THROMBOLYSIS (anterior CIRCULATION) COMPARISON:  CT angiogram of the head and neck of 06/03/2017. MEDICATIONS: Vancomycin 1 g IV antibiotic was administered within 1 hour of the procedure. ANESTHESIA/SEDATION: General anesthesia. CONTRAST:  Isovue 300 approximately 100 mL. FLUOROSCOPY  TIME:  Fluoroscopy Time: 33 minutes 36 seconds (1566 mGy). COMPLICATIONS: None immediate. TECHNIQUE: Following a full explanation of the procedure along with the potential associated complications, an informed witnessed consent was obtained from patient's daughter. Risks of intracranial hemorrhage of 10%, worsening neurological deficit, ventilator dependency, death and inability to revascularize were all reviewed in detail with the patient's daughter. The patient was then put under general anesthesia by the Department of Anesthesiology at [facility]. The right groin was prepped and draped in the usual sterile fashion. Thereafter using modified Seldinger technique, transfemoral access into the right common femoral artery was obtained without difficulty. Over a 0.035 inch guidewire a 5 French Pinnacle sheath was inserted. Through this, and also over a 0.035 inch guidewire a 5 [place] JB 1 catheter was advanced to the aortic arch region and selectively positioned in the right subclavian artery, the right common carotid artery and left common carotid artery. Arteriograms were then obtained extra cranially and intracranially. FINDINGS: The right subclavian arteriogram demonstrates the right vertebral artery origin demonstrating mild stenosis. The vessel, otherwise, opacifies to the cranial skull base to the level of the right vertebrobasilar junction. The right common carotid arteriogram demonstrates the right external carotid artery and its major branches to be widely patent. The right internal carotid artery at the bulb demonstrates smooth shallow atherosclerotic plaque without significant stenosis by the NASCET criteria. No acute ulcerations are seen. The vessel is, otherwise, seen to opacify to the cranial skull base. Minimal FMD-like changes are demonstrated along the medial and posterior aspect of the distal cervical ICA. The petrous and cavernous segments are widely patent. There is mild fusiform  dilatation of the internal carotid artery just proximal to the origin of the ophthalmic artery. Mild stenosis is noted of the supraclinoid right ICA just proximal to the origin of the right posterior communicating artery. The right posterior communicating artery demonstrates some smooth focal areas of caliber irregularity most consistent with intracranial arteriosclerosis. The supraclinoid segment demonstrates a mild smooth narrowing. 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 The right anterior cerebral artery demonstrates mild stenosis  proximally. Prompt opacification via the anterior communicating artery of the left anterior cerebral artery A2 segment and the distal left A1 segment is also noted. The delayed arterial and early capillary phases demonstrate focal patchy areas of hyperperfusion involving the right parietal, the right parietal-occipital cortical, and also the deep posterior frontal anterior parietal and occipital subcortical white matter. The left common carotid arteriogram demonstrates the left external carotid artery and its major branches to be normal. The left internal carotid artery just distal to the bulb has a segmental area of narrowing of approximately 60-65%. No acute ulcerations were seen. The vessel distal to this is seen to opacify normally to the cranial skull base. The petrous and cavernous segments are widely patent. Again demonstrated is mild stenosis of the left internal carotid artery supraclinoid segment. A left posterior communicating artery demonstrates opacification of the left posterior cerebral artery. Flash filling of the left superior cerebellar artery is also noted. The left middle cerebral artery extending from its origin to the bifurcation demonstrates a large filling defect in its entirety to the level of the bifurcation. There is a large filling defect that extends into the  inferior division proximally and to a lesser degree the superior division. The delayed arterial phase on the lateral projection demonstrates severe hypoperfusion of the left middle cerebral artery inferior division and also the anterior cerebral artery. There is occlusion of the left anterior cerebral artery just distal to its origin. PROCEDURE: ENDOVASCULAR REVASCULARIZATION OF OCCLUDED LEFT MIDDLE CEREBRAL ARTERY, AND THEN THE RIGHT MIDDLE CEREBRAL ARTERY WITH 1 PASS WITH THE EMBO TRAP RETRIEVAL DEVICE ACHIEVING A TICI 3 REPERFUSION. The diagnostic JB 1 catheter in the left common carotid artery was exchanged over a 0.035 inch Roadrunner guidewire for an 8 French 55 cm Brite tip neurovascular sheath using biplane roadmap technique and constant fluoroscopic guidance. Good aspiration was obtained from the side port of the neurovascular sheath. This was then connected to continuous heparinized saline infusion. Over the [facility] exchange guidewire, an 85 cm 8 [place] FlowGate balloon guide catheter which had been prepped with 50% contrast and 50% heparinized saline infusion was positioned just proximal to the left common carotid artery bifurcation. The guidewire was removed. Good aspiration obtained from the hub of the 8 [place] FlowGate guide catheter. A gentle contrast injection again demonstrated no evidence of spasms, dissections or of intraluminal filling defects. No change was noted in the 60-65% stenosis of the left internal carotid artery proximally. At this time, in a coaxial manner and with constant heparinized saline infusion using biplane roadmap technique and constant fluoroscopic guidance, a combination of a Catalyst 5 French 115 cm guide catheter inside of which was an 021 Trevo ProVue microcatheter combination was advanced to the distal end of the [facility] guide catheter over a 0.014 inch Softip Synchro micro guidewire. With the micro guidewire leading with a J-tip configuration to avoid dissections or  inducing spasm, the combination was navigated without difficulty to the supraclinoid left ICA. At this time, the 8 [place] FlowGate guide catheter was advanced into the proximal left internal carotid artery. Using a torque device and biplane roadmap technique and constant fluoroscopic guidance, the micro guidewire was advanced through the occluded left M1 segment and into the inferior division of the left middle cerebral artery M2 M3 region followed by the microcatheter. The guidewire was removed. Good aspiration obtained from the hub of the microcatheter. A very gentle contrast injection demonstrated brisk antegrade flow distal to the tip of the microcatheter. At this time  an Embo Trap 5 mm x 3.5 mm retrieval device was prepped and purged with heparinized saline infusion. This was then advanced to the distal end of the microcatheter using biplane roadmap technique and constant fluoroscopic guidance with constant heparinized saline infusion. After the distal and the proximal landing zones of the retrieval device had been ascertained, the O ring on the delivery microcatheter was gently loosened. With slight forward gentle traction with the right hand on the delivery micro guidewire with the left hand the delivery microcatheter was retrieved unsheathing the device with the distal end of the microcatheter just proximal to the proximal portion of the retrieval device. At this time the Catalyst guide catheter was advanced to just the proximal marker on the retrieval device. A gentle control arteriogram performed through the 5 [place] guide catheter demonstrated opacification of the inferior and the anterior divisions with continuous presence of clot. Proximal flow arrest was then established by inflating the balloon of the [facility] guide catheter in the proximal left internal carotid artery. Thereafter, the proximal portion of the retrieval device was captured into the microcatheter. With constant aspiration with a 60 mL  syringe at the hub of the 8 [place] FlowGate guide catheter, and with 20 mL syringe aspiration at the hub of the 5 [place] Catalyst guide catheter, the combination of the retrieval device, the microcatheter and the 5 [place] Catalyst guide catheter were gently retrieved and removed. The retrieval device demonstrated multiple specks of clot entangled in the interstices of the retrieval device. Also demonstrated was dark clot in the Dexter itself. Aspiration was continued with a 60 mL syringe, as the balloon was deflated in the left internal carotid artery. Free back bleed was noted at the hub of the 8 [place] FlowGate guide catheter. A control arteriogram performed through the [facility] guide catheter in the left internal carotid artery demonstrated complete angiographic revascularization of the left middle cerebral artery distribution. No angiographic filling defects or occlusions were seen. There was moderate spasm noted in the left middle cerebral artery proximally and also the superior inferior divisions. This responded briskly to 2 aliquots of 25 mcg of nitroglycerin intra-arterially. Also flow through a 50% stenosis of the left anterior cerebral artery of the vessel distally into the distal A1 and the A2 distributions. Areas of caliber irregularity in the MCA and the anterior cerebral artery suggested intracranial arteriosclerosis. No evidence of extravasation or mass-effect was noted. A control arteriogram performed in the left common carotid artery demonstrated wide patency of the left external carotid artery. Again demonstrated was approximately 60-65% stenosis of the left internal carotid artery at the bulb without evidence of ulcerations. As described above, access into the right common carotid artery was obtained as described above with the 8 [place] FlowGate guide catheter and also the 8 French 55 cm neurovascular sheath over a 5 [place] JB 1 diagnostic catheter which was advanced coaxially through the  [facility] guide catheter over a 0.035 inch Roadrunner guidewire. The tip of the 8 [place] FlowGate guide catheter was in the right common carotid artery just proximal to the bifurcation. The guidewire was removed. Again good aspiration obtained from the hub of the 8 [place] FlowGate guide catheter. A gentle contrast injection demonstrated no evidence of spasms, dissections or of intraluminal filling defects. The triaxial system was under constant heparinized saline infusion. Over a 0.035 inch Roadrunner guidewire, and biplane roadmap technique and constant fluoroscopic guidance, the 8 [place] FlowGate guide catheter was now advanced into the mid right internal carotid artery.  The guidewire was removed. Again good aspiration was noted at the hub of the 8 [place] FlowGate guide catheter. A control arteriogram performed again demonstrated complete occlusion of the right middle cerebral artery M1 segment just distal to the anterior temporal branch. A combination of the Trevo ProVue 021 microcatheter inside of a 5 French 115 cm Catalyst guide catheter was advanced over a 0.014 inch Softip Synchro micro guidewire to the distal end of the 8 [place] FlowGate guide catheter. With the micro guidewire leading with a J-tip configuration to avoid dissections or inducing spasm, the combination was navigated without difficulty to the supraclinoid right ICA. The micro guidewire was then gently manipulated using a torque device and advanced through the occluded right middle cerebral artery into the distal M2 M3 region of the inferior division followed by the microcatheter. The guidewire was removed. Good aspiration obtained from the hub of the microcatheter. A gentle contrast injection demonstrated free antegrade flow into the distal distribution. A 5 mm x 35 mm Embo Trap retrieval device was advanced in a coaxial manner and with constant heparinized saline infusion using biplane roadmap technique and constant fluoroscopic guidance to the  distal end of the microcatheter. The O ring on the delivery microcatheter was released. With slight forward gentle traction with the right hand on the delivery micro guidewire, with the left hand the delivery microcatheter was retrieved unsheathing the retrieval device with the distal end of the microcatheter just proximal to the catheter. At this time, the 5 [place] a Catalyst guide catheter was advanced to just proximal to the proximal marker on the retrieval device. The balloon of the [facility] guide catheter was now inflated for proximal flow arrest in the right internal carotid artery. Using constant aspiration with a 60 mL syringe at the hub of the [facility] guide catheter a 20 mL syringe at the hub of the 5 [place] Catalyst guide catheter, the combination of the retrieval device, the microcatheter and the 5 [place] Catalyst guide catheter was retrieved as aspiration was continued. Aspiration was continued as the balloon was deflated in the right internal carotid artery. Good aspiration was obtained at the hub of the 8 [place] FlowGate guide catheter. Again chunks of clot in addition to a large dark clot of varying consistency entangled within the interstices of the retrieval device. After having obtaining established free back bleed at the 8 [place] FlowGate guide catheter, a control arteriogram performed through the 8 [place] FlowGate guide catheter in the right internal carotid artery demonstrated complete angiographic revascularization of the occluded right middle cerebral artery distribution. There was spasm noted in the inferior and the superior divisions proximally which responded to 2 aliquots of 25 mics of nitroglycerin. No evidence of extravasation or mass-effect was noted. Of note were areas of mild caliber irregularity involving the pericallosal and the right MCA branches consistent with intracranial arteriosclerosis. The 8 [place] FlowGate guide catheter and the 8 [place] Brite tip Envoy neurovascular  sheath were retrieved into the abdominal aorta and exchanged over a J-tip guidewire for an 8 [place] Pinnacle sheath. This was then connected to continuous heparinized saline infusion. This was successfully replaced with a Perclose closure device with achievement of complete hemostasis without evidence of a hematoma at the site of the skin puncture site. The patient's distal pulses remained unchanged bilaterally being 2+ in the dorsalis pedis, freely Dopplerable posterior tibial on the right, and palpable left posterior tibial. After the procedure, the patient's blood pressure and neurological status remained stable. IMPRESSION: Status post endovascular  complete revascularization of occluded right middle cerebral artery and the left middle cerebral artery with 1 pass of Embo Trap retrieval device first on the left then on right achieving a TICI 3 reperfusion on both sides. Underlying mild to moderate intracranial arteriosclerosis involving the posterior communicating and posterior cerebral arteries, and also the middle cerebral arteries. Approximately 60 to 65% stenosis of the left internal carotid artery proximally secondary to a smooth atherosclerotic plaque without ulcerations. Mild to moderate intracranial arteriosclerosis involving the supraclinoid segments of both ICAs. PLAN: Postprocedural CT scan of the brain. Electronically Signed   By: Luanne Bras M.D.   On: 06/04/2017 20:43   Ir Percutaneous Art Thrombectomy/infusion Intracranial Inc Diag Angio  Result Date: 06/07/2017 INDICATION: Acute mental status changes during diagnostic catheter arteriogram. Bilaterally occluded middle cerebral arteries. EXAM: 1. EMERGENT LARGE VESSEL OCCLUSION THROMBOLYSIS (anterior CIRCULATION) COMPARISON:  CT angiogram of the head and neck of 06/03/2017. MEDICATIONS: Vancomycin 1 g IV antibiotic was administered within 1 hour of the procedure. ANESTHESIA/SEDATION: General anesthesia. CONTRAST:  Isovue 300 approximately  100 mL. FLUOROSCOPY TIME:  Fluoroscopy Time: 33 minutes 36 seconds (1566 mGy). COMPLICATIONS: None immediate. TECHNIQUE: Following a full explanation of the procedure along with the potential associated complications, an informed witnessed consent was obtained from patient's daughter. Risks of intracranial hemorrhage of 10%, worsening neurological deficit, ventilator dependency, death and inability to revascularize were all reviewed in detail with the patient's daughter. The patient was then put under general anesthesia by the Department of Anesthesiology at [facility]. The right groin was prepped and draped in the usual sterile fashion. Thereafter using modified Seldinger technique, transfemoral access into the right common femoral artery was obtained without difficulty. Over a 0.035 inch guidewire a 5 [place] Pinnacle sheath was inserted. Through this, and also over a 0.035 inch guidewire a 5 [place] JB 1 catheter was advanced to the aortic arch region and selectively positioned in the right subclavian artery, the right common carotid artery and left common carotid artery. Arteriograms were then obtained extra cranially and intracranially. FINDINGS: The right subclavian arteriogram demonstrates the right vertebral artery origin demonstrating mild stenosis. The vessel, otherwise, opacifies to the cranial skull base to the level of the right vertebrobasilar junction. The right common carotid arteriogram demonstrates the right external carotid artery and its major branches to be widely patent. The right internal carotid artery at the bulb demonstrates smooth shallow atherosclerotic plaque without significant stenosis by the NASCET criteria. No acute ulcerations are seen. The vessel is, otherwise, seen to opacify to the cranial skull base. Minimal FMD-like changes are demonstrated along the medial and posterior aspect of the distal cervical ICA. The petrous and cavernous segments are widely patent. There is  mild fusiform dilatation of the internal carotid artery just proximal to the origin of the ophthalmic artery. Mild stenosis is noted of the supraclinoid right ICA just proximal to the origin of the right posterior communicating artery. The right posterior communicating artery demonstrates some smooth focal areas of caliber irregularity most consistent with intracranial arteriosclerosis. The supraclinoid segment demonstrates a mild smooth narrowing. 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 The right anterior cerebral artery demonstrates mild stenosis proximally. Prompt opacification via the anterior communicating artery of the left anterior cerebral artery A2 segment and the distal left A1 segment is also noted. The delayed arterial and early capillary phases demonstrate focal patchy areas of hyperperfusion involving the right parietal, the  right parietal-occipital cortical, and also the deep posterior frontal anterior parietal and occipital subcortical white matter. The left common carotid arteriogram demonstrates the left external carotid artery and its major branches to be normal. The left internal carotid artery just distal to the bulb has a segmental area of narrowing of approximately 60-65%. No acute ulcerations were seen. The vessel distal to this is seen to opacify normally to the cranial skull base. The petrous and cavernous segments are widely patent. Again demonstrated is mild stenosis of the left internal carotid artery supraclinoid segment. A left posterior communicating artery demonstrates opacification of the left posterior cerebral artery. Flash filling of the left superior cerebellar artery is also noted. The left middle cerebral artery extending from its origin to the bifurcation demonstrates a large filling defect in its entirety to the level of the bifurcation. There is a large filling defect that  extends into the inferior division proximally and to a lesser degree the superior division. The delayed arterial phase on the lateral projection demonstrates severe hypoperfusion of the left middle cerebral artery inferior division and also the anterior cerebral artery. There is occlusion of the left anterior cerebral artery just distal to its origin. PROCEDURE: ENDOVASCULAR REVASCULARIZATION OF OCCLUDED LEFT MIDDLE CEREBRAL ARTERY, AND THEN THE RIGHT MIDDLE CEREBRAL ARTERY WITH 1 PASS WITH THE EMBO TRAP RETRIEVAL DEVICE ACHIEVING A TICI 3 REPERFUSION. The diagnostic JB 1 catheter in the left common carotid artery was exchanged over a 0.035 inch Roadrunner guidewire for an 8 French 55 cm Brite tip neurovascular sheath using biplane roadmap technique and constant fluoroscopic guidance. Good aspiration was obtained from the side port of the neurovascular sheath. This was then connected to continuous heparinized saline infusion. Over the [facility] exchange guidewire, an 85 cm 8 [place] FlowGate balloon guide catheter which had been prepped with 50% contrast and 50% heparinized saline infusion was positioned just proximal to the left common carotid artery bifurcation. The guidewire was removed. Good aspiration obtained from the hub of the 8 [place] FlowGate guide catheter. A gentle contrast injection again demonstrated no evidence of spasms, dissections or of intraluminal filling defects. No change was noted in the 60-65% stenosis of the left internal carotid artery proximally. At this time, in a coaxial manner and with constant heparinized saline infusion using biplane roadmap technique and constant fluoroscopic guidance, a combination of a Catalyst 5 French 115 cm guide catheter inside of which was an 021 Trevo ProVue microcatheter combination was advanced to the distal end of the [facility] guide catheter over a 0.014 inch Softip Synchro micro guidewire. With the micro guidewire leading with a J-tip configuration to avoid  dissections or inducing spasm, the combination was navigated without difficulty to the supraclinoid left ICA. At this time, the 8 [place] FlowGate guide catheter was advanced into the proximal left internal carotid artery. Using a torque device and biplane roadmap technique and constant fluoroscopic guidance, the micro guidewire was advanced through the occluded left M1 segment and into the inferior division of the left middle cerebral artery M2 M3 region followed by the microcatheter. The guidewire was removed. Good aspiration obtained from the hub of the microcatheter. A very gentle contrast injection demonstrated brisk antegrade flow distal to the tip of the microcatheter. At this time an Embo Trap 5 mm x 3.5 mm retrieval device was prepped and purged with heparinized saline infusion. This was then advanced to the distal end of the microcatheter using biplane roadmap technique and constant fluoroscopic guidance with constant heparinized saline infusion. After  the distal and the proximal landing zones of the retrieval device had been ascertained, the O ring on the delivery microcatheter was gently loosened. With slight forward gentle traction with the right hand on the delivery micro guidewire with the left hand the delivery microcatheter was retrieved unsheathing the device with the distal end of the microcatheter just proximal to the proximal portion of the retrieval device. At this time the Catalyst guide catheter was advanced to just the proximal marker on the retrieval device. A gentle control arteriogram performed through the 5 [place] guide catheter demonstrated opacification of the inferior and the anterior divisions with continuous presence of clot. Proximal flow arrest was then established by inflating the balloon of the [facility] guide catheter in the proximal left internal carotid artery. Thereafter, the proximal portion of the retrieval device was captured into the microcatheter. With constant aspiration  with a 60 mL syringe at the hub of the 8 [place] FlowGate guide catheter, and with 20 mL syringe aspiration at the hub of the 5 [place] Catalyst guide catheter, the combination of the retrieval device, the microcatheter and the 5 [place] Catalyst guide catheter were gently retrieved and removed. The retrieval device demonstrated multiple specks of clot entangled in the interstices of the retrieval device. Also demonstrated was dark clot in the [place] itself. Aspiration was continued with a 60 mL syringe, as the balloon was deflated in the left internal carotid artery. Free back bleed was noted at the hub of the 8 [place] FlowGate guide catheter. A control arteriogram performed through the [facility] guide catheter in the left internal carotid artery demonstrated complete angiographic revascularization of the left middle cerebral artery distribution. No angiographic filling defects or occlusions were seen. There was moderate spasm noted in the left middle cerebral artery proximally and also the superior inferior divisions. This responded briskly to 2 aliquots of 25 mcg of nitroglycerin intra-arterially. Also flow through a 50% stenosis of the left anterior cerebral artery of the vessel distally into the distal A1 and the A2 distributions. Areas of caliber irregularity in the MCA and the anterior cerebral artery suggested intracranial arteriosclerosis. No evidence of extravasation or mass-effect was noted. A control arteriogram performed in the left common carotid artery demonstrated wide patency of the left external carotid artery. Again demonstrated was approximately 60-65% stenosis of the left internal carotid artery at the bulb without evidence of ulcerations. As described above, access into the right common carotid artery was obtained as described above with the 8 [place] FlowGate guide catheter and also the 8 French 55 cm neurovascular sheath over a 5 [place] JB 1 diagnostic catheter which was advanced coaxially  through the [facility] guide catheter over a 0.035 inch Roadrunner guidewire. The tip of the 8 [place] FlowGate guide catheter was in the right common carotid artery just proximal to the bifurcation. The guidewire was removed. Again good aspiration obtained from the hub of the 8 [place] FlowGate guide catheter. A gentle contrast injection demonstrated no evidence of spasms, dissections or of intraluminal filling defects. The triaxial system was under constant heparinized saline infusion. Over a 0.035 inch Roadrunner guidewire, and biplane roadmap technique and constant fluoroscopic guidance, the 8 [place] FlowGate guide catheter was now advanced into the mid right internal carotid artery. The guidewire was removed. Again good aspiration was noted at the hub of the 8 [place] FlowGate guide catheter. A control arteriogram performed again demonstrated complete occlusion of the right middle cerebral artery M1 segment just distal to the anterior temporal branch. A  combination of the Trevo ProVue 021 microcatheter inside of a 5 French 115 cm Catalyst guide catheter was advanced over a 0.014 inch Softip Synchro micro guidewire to the distal end of the 8 [place] FlowGate guide catheter. With the micro guidewire leading with a J-tip configuration to avoid dissections or inducing spasm, the combination was navigated without difficulty to the supraclinoid right ICA. The micro guidewire was then gently manipulated using a torque device and advanced through the occluded right middle cerebral artery into the distal M2 M3 region of the inferior division followed by the microcatheter. The guidewire was removed. Good aspiration obtained from the hub of the microcatheter. A gentle contrast injection demonstrated free antegrade flow into the distal distribution. A 5 mm x 35 mm Embo Trap retrieval device was advanced in a coaxial manner and with constant heparinized saline infusion using biplane roadmap technique and constant fluoroscopic  guidance to the distal end of the microcatheter. The O ring on the delivery microcatheter was released. With slight forward gentle traction with the right hand on the delivery micro guidewire, with the left hand the delivery microcatheter was retrieved unsheathing the retrieval device with the distal end of the microcatheter just proximal to the catheter. At this time, the 5 [place] a Catalyst guide catheter was advanced to just proximal to the proximal marker on the retrieval device. The balloon of the [facility] guide catheter was now inflated for proximal flow arrest in the right internal carotid artery. Using constant aspiration with a 60 mL syringe at the hub of the [facility] guide catheter a 20 mL syringe at the hub of the 5 [place] Catalyst guide catheter, the combination of the retrieval device, the microcatheter and the 5 [place] Catalyst guide catheter was retrieved as aspiration was continued. Aspiration was continued as the balloon was deflated in the right internal carotid artery. Good aspiration was obtained at the hub of the 8 [place] FlowGate guide catheter. Again chunks of clot in addition to a large dark clot of varying consistency entangled within the interstices of the retrieval device. After having obtaining established free back bleed at the 8 [place] FlowGate guide catheter, a control arteriogram performed through the 8 [place] FlowGate guide catheter in the right internal carotid artery demonstrated complete angiographic revascularization of the occluded right middle cerebral artery distribution. There was spasm noted in the inferior and the superior divisions proximally which responded to 2 aliquots of 25 mics of nitroglycerin. No evidence of extravasation or mass-effect was noted. Of note were areas of mild caliber irregularity involving the pericallosal and the right MCA branches consistent with intracranial arteriosclerosis. The 8 [place] FlowGate guide catheter and the 8 [place] Brite tip Envoy  neurovascular sheath were retrieved into the abdominal aorta and exchanged over a J-tip guidewire for an 8 [place] Pinnacle sheath. This was then connected to continuous heparinized saline infusion. This was successfully replaced with a Perclose closure device with achievement of complete hemostasis without evidence of a hematoma at the site of the skin puncture site. The patient's distal pulses remained unchanged bilaterally being 2+ in the dorsalis pedis, freely Dopplerable posterior tibial on the right, and palpable left posterior tibial. After the procedure, the patient's blood pressure and neurological status remained stable. IMPRESSION: Status post endovascular complete revascularization of occluded right middle cerebral artery and the left middle cerebral artery with 1 pass of Embo Trap retrieval device first on the left then on right achieving a TICI 3 reperfusion on both sides. Underlying mild to moderate intracranial arteriosclerosis  involving the posterior communicating and posterior cerebral arteries, and also the middle cerebral arteries. Approximately 60 to 65% stenosis of the left internal carotid artery proximally secondary to a smooth atherosclerotic plaque without ulcerations. Mild to moderate intracranial arteriosclerosis involving the supraclinoid segments of both ICAs. PLAN: Postprocedural CT scan of the brain. Electronically Signed   By: Luanne Bras M.D.   On: 06/04/2017 20:43   Ct Head Code Stroke Wo Contrast  Result Date: 06/03/2017 CLINICAL DATA:  Code stroke.  Unresponsive. EXAM: CT HEAD WITHOUT CONTRAST TECHNIQUE: Contiguous axial images were obtained from the base of the skull through the vertex without intravenous contrast. COMPARISON:  05/31/2017 FINDINGS: Brain: Known recent small cortical infarcts in the posterior left cerebral hemisphere are not clearly visualized. Small remote right frontal parietal cortex infarcts, stable. Patchy low-density in the cerebral white matter  from chronic small vessel ischemia, moderate. No acute hemorrhage. No evidence of interval infarct. No hydrocephalus or masslike findings. Vascular: Intravenous contrast. Major vessels appear symmetrically dense. Patient was in the cath lab this morning. Operative details are currently not known. Skull: Negative Sinuses/Orbits: Negative Other: Prelim via text page sent 06/03/2017 at 10:39 am to Dr. Rosalin Hawking. ASPECTS [facility] Stroke Program Early CT Score) Not scored in this setting. IMPRESSION: 1. No acute finding or change from 3 days ago. 2. 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 Chronic small vessel ischemic changes elsewhere are stable. Electronically Signed   By: Monte Fantasia M.D.   On: 06/03/2017 10:41   Ir Angio Vertebral Sel Subclavian Innominate Uni R Mod Sed  Result Date: 06/07/2017 INDICATION: Acute mental status changes during diagnostic catheter arteriogram. Bilaterally occluded middle cerebral arteries. EXAM: 1. EMERGENT LARGE VESSEL OCCLUSION THROMBOLYSIS (anterior CIRCULATION) COMPARISON:  CT angiogram of the head and neck of 06/03/2017. MEDICATIONS: Vancomycin 1 g IV antibiotic was administered within 1 hour of the procedure. ANESTHESIA/SEDATION: General anesthesia. CONTRAST:  Isovue 300 approximately 100 mL. FLUOROSCOPY TIME:  Fluoroscopy Time: 33 minutes 36 seconds (1566 mGy). COMPLICATIONS: None immediate. TECHNIQUE: Following a full explanation of the procedure along with the potential associated complications, an informed witnessed consent was obtained from patient's daughter. Risks of intracranial hemorrhage of 10%, worsening neurological deficit, ventilator dependency, death and inability to revascularize were all reviewed in detail with the patient's daughter. The patient was then put under general anesthesia by the Department of Anesthesiology at [facility]. The right groin was prepped and draped in the usual sterile fashion.  Thereafter using modified Seldinger technique, transfemoral access into the right common femoral artery was obtained without difficulty. Over a 0.035 inch guidewire a 5 [place] Pinnacle sheath was inserted. Through this, and also over a 0.035 inch guidewire a 5 [place] JB 1 catheter was advanced to the aortic arch region and selectively positioned in the right subclavian artery, the right common carotid artery and left common carotid artery. Arteriograms were then obtained extra cranially and intracranially. FINDINGS: The right subclavian arteriogram demonstrates the right vertebral artery origin demonstrating mild stenosis. The vessel, otherwise, opacifies to the cranial skull base to the level of the right vertebrobasilar junction. The right common carotid arteriogram demonstrates the right external carotid artery and its major branches to be widely patent. The right internal carotid artery at the bulb demonstrates smooth shallow atherosclerotic plaque without significant stenosis by the NASCET criteria. No acute ulcerations are seen. The vessel is, otherwise, seen to opacify to the cranial skull base. Minimal FMD-like changes are demonstrated along the  medial and posterior aspect of the distal cervical ICA. The petrous and cavernous segments are widely patent. There is mild fusiform dilatation of the internal carotid artery just proximal to the origin of the ophthalmic artery. Mild stenosis is noted of the supraclinoid right ICA just proximal to the origin of the right posterior communicating artery. The right posterior communicating artery demonstrates some smooth focal areas of caliber irregularity most consistent with intracranial arteriosclerosis. The supraclinoid segment demonstrates a mild smooth narrowing. 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 The right anterior cerebral artery demonstrates mild stenosis  proximally. Prompt opacification via the anterior communicating artery of the left anterior cerebral artery A2 segment and the distal left A1 segment is also noted. The delayed arterial and early capillary phases demonstrate focal patchy areas of hyperperfusion involving the right parietal, the right parietal-occipital cortical, and also the deep posterior frontal anterior parietal and occipital subcortical white matter. The left common carotid arteriogram demonstrates the left external carotid artery and its major branches to be normal. The left internal carotid artery just distal to the bulb has a segmental area of narrowing of approximately 60-65%. No acute ulcerations were seen. The vessel distal to this is seen to opacify normally to the cranial skull base. The petrous and cavernous segments are widely patent. Again demonstrated is mild stenosis of the left internal carotid artery supraclinoid segment. A left posterior communicating artery demonstrates opacification of the left posterior cerebral artery. Flash filling of the left superior cerebellar artery is also noted. The left middle cerebral artery extending from its origin to the bifurcation demonstrates a large filling defect in its entirety to the level of the bifurcation. There is a large filling defect that extends into the inferior division proximally and to a lesser degree the superior division. The delayed arterial phase on the lateral projection demonstrates severe hypoperfusion of the left middle cerebral artery inferior division and also the anterior cerebral artery. There is occlusion of the left anterior cerebral artery just distal to its origin. PROCEDURE: ENDOVASCULAR REVASCULARIZATION OF OCCLUDED LEFT MIDDLE CEREBRAL ARTERY, AND THEN THE RIGHT MIDDLE CEREBRAL ARTERY WITH 1 PASS WITH THE EMBO TRAP RETRIEVAL DEVICE ACHIEVING A TICI 3 REPERFUSION. The diagnostic JB 1 catheter in the left common carotid artery was exchanged over a 0.035 inch  Roadrunner guidewire for an 8 French 55 cm Brite tip neurovascular sheath using biplane roadmap technique and constant fluoroscopic guidance. Good aspiration was obtained from the side port of the neurovascular sheath. This was then connected to continuous heparinized saline infusion. Over the [facility] exchange guidewire, an 85 cm 8 [place] FlowGate balloon guide catheter which had been prepped with 50% contrast and 50% heparinized saline infusion was positioned just proximal to the left common carotid artery bifurcation. The guidewire was removed. Good aspiration obtained from the hub of the 8 [place] FlowGate guide catheter. A gentle contrast injection again demonstrated no evidence of spasms, dissections or of intraluminal filling defects. No change was noted in the 60-65% stenosis of the left internal carotid artery proximally. At this time, in a coaxial manner and with constant heparinized saline infusion using biplane roadmap technique and constant fluoroscopic guidance, a combination of a Catalyst 5 French 115 cm guide catheter inside of which was an 021 Trevo ProVue microcatheter combination was advanced to the distal end of the [facility] guide catheter over a 0.014 inch Softip Synchro micro guidewire. With the micro guidewire leading with  a J-tip configuration to avoid dissections or inducing spasm, the combination was navigated without difficulty to the supraclinoid left ICA. At this time, the 8 [place] FlowGate guide catheter was advanced into the proximal left internal carotid artery. Using a torque device and biplane roadmap technique and constant fluoroscopic guidance, the micro guidewire was advanced through the occluded left M1 segment and into the inferior division of the left middle cerebral artery M2 M3 region followed by the microcatheter. The guidewire was removed. Good aspiration obtained from the hub of the microcatheter. A very gentle contrast injection demonstrated brisk antegrade flow distal to  the tip of the microcatheter. At this time an Embo Trap 5 mm x 3.5 mm retrieval device was prepped and purged with heparinized saline infusion. This was then advanced to the distal end of the microcatheter using biplane roadmap technique and constant fluoroscopic guidance with constant heparinized saline infusion. After the distal and the proximal landing zones of the retrieval device had been ascertained, the O ring on the delivery microcatheter was gently loosened. With slight forward gentle traction with the right hand on the delivery micro guidewire with the left hand the delivery microcatheter was retrieved unsheathing the device with the distal end of the microcatheter just proximal to the proximal portion of the retrieval device. At this time the Catalyst guide catheter was advanced to just the proximal marker on the retrieval device. A gentle control arteriogram performed through the 5 [place] guide catheter demonstrated opacification of the inferior and the anterior divisions with continuous presence of clot. Proximal flow arrest was then established by inflating the balloon of the [facility] guide catheter in the proximal left internal carotid artery. Thereafter, the proximal portion of the retrieval device was captured into the microcatheter. With constant aspiration with a 60 mL syringe at the hub of the 8 [place] FlowGate guide catheter, and with 20 mL syringe aspiration at the hub of the 5 [place] Catalyst guide catheter, the combination of the retrieval device, the microcatheter and the 5 [place] Catalyst guide catheter were gently retrieved and removed. The retrieval device demonstrated multiple specks of clot entangled in the interstices of the retrieval device. Also demonstrated was dark clot in the [place] itself. Aspiration was continued with a 60 mL syringe, as the balloon was deflated in the left internal carotid artery. Free back bleed was noted at the hub of the 8 [place] FlowGate guide  catheter. A control arteriogram performed through the [facility] guide catheter in the left internal carotid artery demonstrated complete angiographic revascularization of the left middle cerebral artery distribution. No angiographic filling defects or occlusions were seen. There was moderate spasm noted in the left middle cerebral artery proximally and also the superior inferior divisions. This responded briskly to 2 aliquots of 25 mcg of nitroglycerin intra-arterially. Also flow through a 50% stenosis of the left anterior cerebral artery of the vessel distally into the distal A1 and the A2 distributions. Areas of caliber irregularity in the MCA and the anterior cerebral artery suggested intracranial arteriosclerosis. No evidence of extravasation or mass-effect was noted. A control arteriogram performed in the left common carotid artery demonstrated wide patency of the left external carotid artery. Again demonstrated was approximately 60-65% stenosis of the left internal carotid artery at the bulb without evidence of ulcerations. As described above, access into the right common carotid artery was obtained as described above with the 8 [place] FlowGate guide catheter and also the 8 French 55 cm neurovascular sheath over a 5 [place] JB  1 diagnostic catheter which was advanced coaxially through the [facility] guide catheter over a 0.035 inch Roadrunner guidewire. The tip of the 8 [place] FlowGate guide catheter was in the right common carotid artery just proximal to the bifurcation. The guidewire was removed. Again good aspiration obtained from the hub of the 8 [place] FlowGate guide catheter. A gentle contrast injection demonstrated no evidence of spasms, dissections or of intraluminal filling defects. The triaxial system was under constant heparinized saline infusion. Over a 0.035 inch Roadrunner guidewire, and biplane roadmap technique and constant fluoroscopic guidance, the 8 [place] FlowGate guide catheter was now  advanced into the mid right internal carotid artery. The guidewire was removed. Again good aspiration was noted at the hub of the 8 [place] FlowGate guide catheter. A control arteriogram performed again demonstrated complete occlusion of the right middle cerebral artery M1 segment just distal to the anterior temporal branch. A combination of the Trevo ProVue 021 microcatheter inside of a 5 French 115 cm Catalyst guide catheter was advanced over a 0.014 inch Softip Synchro micro guidewire to the distal end of the 8 [place] FlowGate guide catheter. With the micro guidewire leading with a J-tip configuration to avoid dissections or inducing spasm, the combination was navigated without difficulty to the supraclinoid right ICA. The micro guidewire was then gently manipulated using a torque device and advanced through the occluded right middle cerebral artery into the distal M2 M3 region of the inferior division followed by the microcatheter. The guidewire was removed. Good aspiration obtained from the hub of the microcatheter. A gentle contrast injection demonstrated free antegrade flow into the distal distribution. A 5 mm x 35 mm Embo Trap retrieval device was advanced in a coaxial manner and with constant heparinized saline infusion using biplane roadmap technique and constant fluoroscopic guidance to the distal end of the microcatheter. The O ring on the delivery microcatheter was released. With slight forward gentle traction with the right hand on the delivery micro guidewire, with the left hand the delivery microcatheter was retrieved unsheathing the retrieval device with the distal end of the microcatheter just proximal to the catheter. At this time, the 5 [place] a Catalyst guide catheter was advanced to just proximal to the proximal marker on the retrieval device. The balloon of the [facility] guide catheter was now inflated for proximal flow arrest in the right internal carotid artery. Using constant aspiration with  a 60 mL syringe at the hub of the [facility] guide catheter a 20 mL syringe at the hub of the 5 [place] Catalyst guide catheter, the combination of the retrieval device, the microcatheter and the 5 [place] Catalyst guide catheter was retrieved as aspiration was continued. Aspiration was continued as the balloon was deflated in the right internal carotid artery. Good aspiration was obtained at the hub of the 8 [place] FlowGate guide catheter. Again chunks of clot in addition to a large dark clot of varying consistency entangled within the interstices of the retrieval device. After having obtaining established free back bleed at the 8 [place] FlowGate guide catheter, a control arteriogram performed through the 8 [place] FlowGate guide catheter in the right internal carotid artery demonstrated complete angiographic revascularization of the occluded right middle cerebral artery distribution. There was spasm noted in the inferior and the superior divisions proximally which responded to 2 aliquots of 25 mics of nitroglycerin. No evidence of extravasation or mass-effect was noted. Of note were areas of mild caliber irregularity involving the pericallosal and the right MCA branches consistent with intracranial  arteriosclerosis. The 8 [place] FlowGate guide catheter and the 8 [place] Brite tip Envoy neurovascular sheath were retrieved into the abdominal aorta and exchanged over a J-tip guidewire for an 8 [place] Pinnacle sheath. This was then connected to continuous heparinized saline infusion. This was successfully replaced with a Perclose closure device with achievement of complete hemostasis without evidence of a hematoma at the site of the skin puncture site. The patient's distal pulses remained unchanged bilaterally being 2+ in the dorsalis pedis, freely Dopplerable posterior tibial on the right, and palpable left posterior tibial. After the procedure, the patient's blood pressure and neurological status remained stable.  IMPRESSION: Status post endovascular complete revascularization of occluded right middle cerebral artery and the left middle cerebral artery with 1 pass of Embo Trap retrieval device first on the left then on right achieving a TICI 3 reperfusion on both sides. Underlying mild to moderate intracranial arteriosclerosis involving the posterior communicating and posterior cerebral arteries, and also the middle cerebral arteries. Approximately 60 to 65% stenosis of the left internal carotid artery proximally secondary to a smooth atherosclerotic plaque without ulcerations. Mild to moderate intracranial arteriosclerosis involving the supraclinoid segments of both ICAs. PLAN: Postprocedural CT scan of the brain. Electronically Signed   By: Luanne Bras M.D.   On: 06/04/2017 20:43    Time Spent in minutes  35   Louellen Molder M.D on 06/14/2017 at 2:37 PM  Between 7am to 7pm - Pager - [phone]  After 7pm go to [email or web address] - password [facility]  [facility] -  Office  [phone]

## 2017-06-14 NOTE — Evaluation (Signed)
Clinical/Bedside Swallow Evaluation Patient Details  Name: [name] MRN: [id number] Date of Birth: 03/14/1951  Today's Date: 06/14/2017 Time: SLP Start Time (ACUTE ONLY): 9833 SLP Stop Time (ACUTE ONLY): 0947 SLP Time Calculation (min) (ACUTE ONLY): 10 min  Past Medical History:  Past Medical History:  Diagnosis Date  . Anemia    takes iron supplement  . Anesthesia complication    disorientation due to pseudotumor  . Diabetes mellitus    IDDM  . Family history of anesthesia complication 20 yrs ago   brother stopped breathing for a minute or two  . GERD (gastroesophageal reflux disease)   . Headache(784.0)    due to pseudotumor; daily headache  . Hypercholesteremia    unable to tolerate statins  . Hypertension    under control with meds., has been on med. x 5 yr.  . Peripheral neuropathy   . PONV (postoperative nausea and vomiting)   . 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 Synovitis of ankle 04/2013   left  . Wears dentures    full   Past Surgical History:  Past Surgical History:  Procedure Laterality Date  . ANKLE ARTHROSCOPY Left 05/18/2013   Procedure: LEFT ANKLE ARTHROSCOPY WITH DEBRIDEMENT,  SUBTALAR OPEN DEBRIDEMENT ;  Surgeon: Wylene Simmer, MD;  Location: [place];  Service: Orthopedics;  Laterality: Left;  . APPENDECTOMY     as a child  . BALLOON DILATION N/A 07/04/2013   Procedure: BALLOON DILATION;  Surgeon: Garlan Fair, MD;  Location: [facility];  Service: Endoscopy;  Laterality: N/A;  . BLADDER SUSPENSION    . BREAST LUMPECTOMY W/ NEEDLE LOCALIZATION Left 05/22/2011  . CAROTID ANGIOGRAPHY Bilateral 06/03/2017   Procedure: Bilateral Carotid Angiography;  Surgeon: Conrad Sanders, MD;  Location: [place] [facility];  Service: Cardiovascular;  Laterality: Bilateral;  . COLONOSCOPY WITH PROPOFOL N/A 05/14/2014   Procedure: COLONOSCOPY WITH PROPOFOL;  Surgeon:  Garlan Fair, MD;  Location: [facility];  Service: Endoscopy;  Laterality: N/A;  . ESOPHAGEAL DILATION  06/23/2006; 08/05/2004  . ESOPHAGOGASTRODUODENOSCOPY N/A 07/04/2013   Procedure: ESOPHAGOGASTRODUODENOSCOPY (EGD);  Surgeon: Garlan Fair, MD;  Location: [facility];  Service: Endoscopy;  Laterality: N/A;  . ESOPHAGOGASTRODUODENOSCOPY (EGD) WITH PROPOFOL N/A 05/14/2014   Procedure: ESOPHAGOGASTRODUODENOSCOPY (EGD) WITH PROPOFOL;  Surgeon: Garlan Fair, MD;  Location: [facility];  Service: Endoscopy;  Laterality: N/A;  . IR ANGIO VERTEBRAL SEL SUBCLAVIAN INNOMINATE UNI R MOD SED  06/03/2017  . IR PERCUTANEOUS ART THROMBECTOMY/INFUSION INTRACRANIAL INC DIAG ANGIO  06/03/2017  . IR PERCUTANEOUS ART THROMBECTOMY/INFUSION INTRACRANIAL INC DIAG ANGIO  06/03/2017  . LUMBAR PERITONEAL SHUNT     x 2  . OVARIAN CYST REMOVAL     age 40  . RADIOLOGY WITH ANESTHESIA N/A 06/03/2017   Procedure: RADIOLOGY WITH ANESTHESIA;  Surgeon: Luanne Bras, MD;  Location: [place];  Service: Radiology;  Laterality: N/A;  . SHUNT REVISION  2007  . TOTAL ABDOMINAL HYSTERECTOMY W/ BILATERAL SALPINGOOPHORECTOMY  1994   age 78s  . WRIST SURGERY Left 2014    [name]   HPI:  66yo female with hx HTN, DM who presented initially 8/13 to her PCP with headache, R sided weakness and vision changes. She was sent for MRI which revealed multiple areas of acute L MCA infarct. She was sent to ER and admitted for CVA workup. On 8/16 pt was in cath lab  for diagnostic carotid and cerebral angiogram with vascular surgery when she became suddenly unresponsive with L sided weakness and possible L sided seizure-like activity. CTA revealed bilateral M1 occlusions. She was intubated and taken urgently to IR for revascularization and rapid resolution of embolism. ETT 8/16-8/20, 8/20-8/23, trach 8/23, pt tolerated trach collar overnight.   Assessment / Plan / Recommendation Clinical Impression  Pt seemed to have an  appropriate oral response to ice chip trial but with multiple swallows observed. After single ice chip was given with PMV in place, pt then started coughing and regurgitated what appeared to be the medications that her RN had just given her via her NGT. RN present and aware. Pt declined additional PO trials due to reports of nausea. SLP left pt seated in an upright position with PMV removed. Would continue to remain NPO for now. She will benefit from isntrumental swallowing evaluation when she is able to tolerate it given her risk for silent aspiration given acute CVA, repeated intubations, and new trach.  SLP Visit Diagnosis: Dysphagia, unspecified (R13.10)    Aspiration Risk  Moderate aspiration risk;Severe aspiration risk    Diet Recommendation NPO;Alternative means - temporary   Medication Administration: Via alternative means    Other  Recommendations Oral Care Recommendations: Oral care QID Other Recommendations: Have oral suction available   Follow up Recommendations Inpatient Rehab      Frequency and Duration min 3x week  2 weeks       Prognosis Prognosis for Safe Diet Advancement: Good      Swallow Study   General HPI: 66yo female with hx HTN, DM who presented initially 8/13 to her PCP with headache, R sided weakness and vision changes. She was sent for MRI which revealed multiple areas of acute L MCA infarct. She was sent to ER and admitted for CVA workup. On 8/16 pt was in cath lab for diagnostic carotid and cerebral angiogram with vascular surgery when she became suddenly unresponsive with L sided weakness and possible L sided seizure-like activity. CTA revealed bilateral M1 occlusions. She was intubated and taken urgently to IR for revascularization and rapid resolution of embolism. ETT 8/16-8/20, 8/20-8/23, trach 8/23, pt tolerated trach collar overnight. Type of Study: Bedside Swallow Evaluation Previous Swallow Assessment: none in chart Diet Prior to this Study:  NPO;NG Tube Temperature Spikes Noted: No Respiratory Status: Trach Collar History of Recent Intubation: Yes Length of Intubations (days): 8 days (across 2 intubations) Date extubated:  (trach 8/23) Behavior/Cognition: Alert;Cooperative Oral Care Completed by SLP: Yes Patient Positioning: Upright in bed Baseline Vocal Quality: Wet;Hoarse;Low vocal intensity Volitional Cough: Weak    Oral/Motor/Sensory Function     Ice Chips Ice chips: Impaired Presentation: Spoon Other Comments: regurgitation   Thin Liquid Thin Liquid: Not tested    Nectar Thick Nectar Thick Liquid: Not tested   Honey Thick Honey Thick Liquid: Not tested   Puree Puree: Not tested   Solid   GO   Solid: Not tested        [name] 06/14/2017,10:43 AM   [name], M.A. CCC-SLP [phone]

## 2017-06-14 NOTE — Progress Notes (Signed)
STROKE TEAM PROGRESS NOTE   SUBJECTIVE (INTERVAL HISTORY) Her daughter is at the bedside.  Patient is still on trach collar with more secretions today. Complains of right ear pain and was put on cipro ear drop. She is still on ancef also. Rehab recommend CIR vs. LTACH at this time. Will increase IVF and add mucomyst neb.   OBJECTIVE Temp:  [98.6 F (37 C)-98.9 F (37.2 C)] 98.9 F (37.2 C) (08/27 1227) Pulse Rate:  [77-95] 78 (08/27 1227) Cardiac Rhythm: Normal sinus rhythm (08/27 0700) Resp:  [11-22] 19 (08/27 1227) BP: (107-151)/(47-118) 114/57 (08/27 1227) SpO2:  [96 %-100 %] 100 % (08/27 1227) FiO2 (%):  [28 %] 28 % (08/27 1115) Weight:  [166 lb 7.2 oz (75.5 kg)] 166 lb 7.2 oz (75.5 kg) (08/27 0325)  CBC:   Recent Labs Lab 06/13/17 0250 06/14/17 0309  WBC 12.5* 11.0*  HGB 9.6* 8.1*  HCT 30.5* 26.5*  MCV 89.4 89.5  PLT 480* 405*    Basic Metabolic Panel:   Recent Labs Lab 06/10/17 0630  06/13/17 0250 06/14/17 0309  NA 140  < > 144 141  K 3.7  < > 4.1 3.0*  CL 107  < > 105 105  CO2 26  < > 28 27  GLUCOSE 152*  < > 67 78  BUN 29*  < > 36* 40*  CREATININE 0.86  < > 0.82 0.88  CALCIUM 8.2*  < > 9.1 8.5*  MG 1.8  --  2.2  --   PHOS  --   --  2.3*  --   < > = values in this interval not displayed.  Lipid Panel:     Component Value Date/Time   CHOL 297 (H) 06/02/2017 0741   TRIG 220 (H) 06/10/2017 0630   HDL 49 06/02/2017 0741   CHOLHDL 6.1 06/02/2017 0741   VLDL 37 06/02/2017 0741   LDLCALC 211 (H) 06/02/2017 0741   HgbA1c:  Lab Results  Component Value Date   HGBA1C 9.5 (H) 06/02/2017   Urine Drug Screen: No results found for: LABOPIA, COCAINSCRNUR, LABBENZ, AMPHETMU, THCU, LABBARB  Alcohol Level No results found for: [place] I have personally reviewed the radiological images below and agree with the radiology interpretations.  Ct Angio Head and neck W Or Wo Contrast 05/31/2017 IMPRESSION: 1. No emergent large vessel occlusion. 2. Large  amount of noncalcified plaque at the proximal left internal carotid artery, causing 60-65% stenosis. This is the most likely source of emboli for the previously identified left parietal and occipital infarcts. 3. No other intracranial or cervical arterial stenosis. 4. Minimal Aortic Atherosclerosis (ICD10-I70.0).   Mr Jeri Cos Wo Contrast 05/30/2017 IMPRESSION: Multiple small areas of acute infarct in the left parietal cortex and left occipital cortex. Recommend CTA head and neck to evaluate for source of emboli or stenosis causing these infarcts Numerous chronic white matter infarcts bilaterally   CUS 06/01/2017 1-39% right internal carotid artery stenosis and 40-59% left internal carotid artery stenosis. Vertebral arteries are patent with antegrade flow.  TTE 8/114/2018  - Left ventricle: The cavity size was normal. Systolic function was normal. The estimated ejection fraction was in the range of 60% to 65%. Wall motion was normal; there were no regional wall motion abnormalities. Doppler parameters are consistent with abnormal left ventricular relaxation (grade 1 diastolic dysfunction).  Carotid Angiography 06/03/2017 FINDING(S):  Type I aortic arch: widely patent  Innominate artery: widely patent ? R common carotid artery: widely patent ? R internal carotid  artery: minimal disease, diffuse atherosclerotic plaque suggested ? R external carotid artery: minimal disease, diffuse atherosclerotic plaque suggested ? R subclavian artery: patent ? R vertebral artery: patent proximally  Left common carotid artery: patent ? L internal carotid artery: patent, 43% stenosis by NASCET proximally ? L external carotid artery: patent  Left subclavian artery: patent ? Left vertebral artery: patent  Intracranial findings to be read by Neuroradiology  CT Head Code Stroke  06/03/2017 IMPRESSION: 1. No acute finding or change from 3 days ago. 2. Known recent small cortical infarcts in the left  cerebrum are not clearly visualized. Chronic small vessel ischemic changes elsewhere. are stable  CTA Head/Neck 06/03/2017 IMPRESSION: 1. Right M1 embolus with subtotal occlusion but severe stenosis. The anterior temporal branch is covered and non-opacified.  2. Elongated embolus within the left M1 and proximal M2 vessels. 3. Negative for dissection or interval ulceration in the arch and cervical carotids. 4. Carotid bifurcation atherosclerosis with approximately 50-60% left ICA bulb narrowing. Visually the stenosis appears worse due to positive remodeling at the low-density plaque. 5. Moderate atheromatous narrowing of the right V4 and mid basilar.  CT Head WO Contrast 06/03/2017 IMPRESSION: 1. Several small foci of enhancement within bilateral parietal and left frontal cortex as well as left basal ganglia probably representing enhancing infarctions. 2. No large hemorrhage, focal mass effect, herniation, or hydrocephalus.  DG Chest Port 1 View 06/03/2017 IMPRESSION: 1. The ETT terminates 1.5 cm above the carina. Recommend withdrawing 1 cm. 2. New infiltrate in the lateral left lung base is nonspecific.  Recommend attention on follow-up.  DSA 06/03/2017 S/P bilateral common carotid arteriogram,and Rt vert angiogram,followed by complete revascularization of occluded  MCAs M1 segments bilaterally, with x 1 pass with embotrap 38mmx 35 mm  Retrieval device achieving a TICI3 reperfusion bilaterally.  EEG 06/03/2017 This EEG is abnormal and findings are suggestive of mild generalized cerebral dysfunction. Intermittent arm twitching was not associated with any epileptiform activity.  MRI Brain Wo Contrast 06/04/2017 IMPRESSION: 1. 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 No hemorrhagic conversion. 2. Moderate chronic small vessel ischemic disease.    PHYSICAL EXAM  Temp:  [98.6 F (37 C)-98.9 F (37.2  C)] 98.9 F (37.2 C) (08/27 1227) Pulse Rate:  [77-95] 78 (08/27 1227) Resp:  [11-22] 19 (08/27 1227) BP: (107-151)/(47-118) 114/57 (08/27 1227) SpO2:  [96 %-100 %] 100 % (08/27 1227) FiO2 (%):  [28 %] 28 % (08/27 1115) Weight:  [166 lb 7.2 oz (75.5 kg)] 166 lb 7.2 oz (75.5 kg) (08/27 0325)  General - pleasant middle aged Caucasian lady, well developed, on trach collar  Ophthalmologic - Fundi not visualized.  Cardiovascular - Regular rate and rhythm.  Neuro - s/p tracheostomy on trach collar. Awake, alert and following simple peripheral and central commands. Eyes moving both direction, blinking to visual threat bilaterally. PERRL, . no significant facial asymmetry, tongue midline. LUE 5-/5, RUE 4/5 proximal and 3/5 wrist extension, 0/5 hand grip. LLE 4/5 at least, RLE 4-/5. DTR 1+ and no babinski. Sensation symmetrical, coordination not cooperative and gait not tested.   ASSESSMENT/PLAN Ms. [name] is a 66 y.o. female with history of HTN, HLD, pseudomotor cerebri, DM  presenting with headache and weakness of right hand. She did not receive IV t-PA due to out of window. CTA head and neck showed left ICA 65% stenosis. Dr. Bridgett Larsson consulted. During cerebral angiogram this morning, patient had acute mental status decline, became unresponsive. Code  stroke called.  Found to have M1 thrombus, status post mechanical thrombectomy. MRI 06/04/2017 with numerous acute new supra- and infratentorial infarcts spanning multiple vascular territories consistent with embolic phenomena.   Stroke - bilateral MCA acute thrombosis, likely related to cerebral angiogram procedure treated with bilateral mechanical embolectomy with embotrap device with excellent revascularization  Acute mental status decline during procedure  CT negative for acute hemorrhage or infarct  CTA head and neck showed bilateral MCA acute thrombosis  S/p mechanical thrombectomy with bilateral TICI3 reperfusion  EEG with no seizure  activity  MRI showed bilateral supra-and infratentorial infarcts, consistent with cardioembolic phenomena  Continue DAPT and supportive care   S/p trach   Therapy recommendations: CIR vs. LTACH  Disposition:  Pending  Recent stroke: acute and subacute small pathcy infarcts in the left MCA distribution, possibly artery to artery embolic due to left ICA stenosis.     Resultant  Right hand subtle weakness  MRI head Multiple small areas of acute infarct in the left parietal cortex and left occipital cortex. Numerous chronic white matter infarcts bilaterally  CTA head and neck - Large amount of noncalcified plaque at the proximal left internal carotid artery, causing 60-65% stenosis.   2D Echo  EF 60-65%  CUS - left ICA 40-59% stenosis  Cerebral angio < 50% left ICA stenosis  LDL 211  HgbA1c 9.5  SCDs for VTE prophylaxis Diet NPO time specified  aspirin 81 mg daily prior to admission, now on aspirin 325 mg daily and clopidogrel 75 mg daily.    Patient counseled to be compliant with her antithrombotic medications  Ongoing aggressive stroke risk factor management  Left ICA stenosis  CTA neck - left ICA 60-65%  CUS - left ICA 40-59%  DSA: left ICA < 50% stenosis  No intervention needed at this time  Continue follow-up as outpatient  Respiratory failure  Tracheostomy on 06/10/2017 due to upper airway angioedema without cuff leak failed steroids  tolerating trach collar  Still has copious secretions  Increase IVF for hydration  Mucomyst neb for secretion   Hypertension  Stable  On clonidine patch and metoprolol  Long-term BP goal normotensive  Hyperlipidemia  Home meds:  none  LDL 211, goal < 70  Patient not tolerating statins. self-reports leg numbness, chest pain, severe headaches with statins on multiple occassions with multiple providers  Recommend follow up with PCP Dr. Felipa Eth for consideration of PCSK9 inhibitor. Medication name given to  pt.  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 Jaynee Eagles at [facility] day # 14  This patient is critically ill due to bilateral MCA thrombosis s/p thrombectomy, recent stroke, respiratory failure s/p trach, left ICA stenosis and at significant risk of neurological worsening, death form recurrent stroke, hemorrhagic transformation, seizure, DKA, respiratory failure. This patient's care requires constant monitoring of vital signs, hemodynamics, respiratory and cardiac monitoring, review of multiple databases, neurological assessment, discussion with family, other specialists and medical decision making of high complexity. I spent 30 minutes of neurocritical care time in the care of this patient. I had updated daughter at bedside.   Rosalin Hawking, MD PhD Stroke Neurology 06/14/2017 2:38 PM  To  contact Stroke Continuity provider, please refer to [email or web address] After hours, contact General Neurology

## 2017-06-14 NOTE — Progress Notes (Signed)
  Speech Language Pathology Treatment: [name] Speaking valve  Patient Details Name: [name] MRN: [id number] DOB: 10/17/1951 Today's Date: 06/14/2017 Time: 1007-1219 SLP Time Calculation (min) (ACUTE ONLY): 8 min  Assessment / Plan / Recommendation Clinical Impression  Pt had a "rough night" per her daughter, not getting much sleep due to ear pain and frequent coughing. PMV was placed for 1-2 minute intervals with evidence of back pressure noted. SLP provided Mod cues for coughing to try to clear secretions but without production of any secretions tracheally or orally. A small amount of phonation was produced that was hoarse, low, and wet in quality. Trials were stopped after pt had regurgitation of what appeared to be meds just administered by RN via Cortrack (RN present and aware). Recommend continued use with SLP only.   HPI HPI: 66yo female with hx HTN, DM who presented initially 8/13 to her PCP with headache, R sided weakness and vision changes. She was sent for MRI which revealed multiple areas of acute L MCA infarct. She was sent to ER and admitted for CVA workup. On 8/16 pt was in cath lab for diagnostic carotid and cerebral angiogram with vascular surgery when she became suddenly unresponsive with L sided weakness and possible L sided seizure-like activity. CTA revealed bilateral M1 occlusions. She was intubated and taken urgently to IR for revascularization and rapid resolution of embolism. ETT 8/16-8/20, 8/20-8/23, trach 8/23, pt tolerated trach collar overnight.      SLP Plan  Continue with current plan of care       Recommendations         Patient may use Passy-Muir Speech Valve: with SLP only PMSV Supervision: Full MD: Please consider changing trach tube to : Smaller size;Cuffless         Oral Care Recommendations: Oral care QID Follow up Recommendations: Inpatient Rehab SLP Visit Diagnosis: Aphonia (R49.1) Plan: Continue with current plan of  care       GO                [name] 06/14/2017, 10:30 AM  [name], M.A. CCC-SLP [phone]

## 2017-06-14 NOTE — Consult Note (Signed)
Physical Medicine and Rehabilitation Consult   Reason for Consult: Bilateral strokes with decline in mobility and ability to carry out ADL tasks.    Referring Physician: Dr. Sherral Hammers.    HPI: [name] is a 66 y.o. 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 She was sent to the ED for full work up. History taken from chart review. Carotid dopplers done revealing L-40-59% ICA stenosis. Cardiac echo revealed  EF 60-65% with no wall abnormality and grade 1 diastolic dysfunction.  CTA head/neck done revealing large non-calcified plaque in proximal L-ICA causing 60-65% stenosis and likely source of emboli.   She underwent cerebral angiogram for work up on 8/16 revealing < 50% L-ICA stenosis and during the procedure became unresponsive with bilateral UE tonic posturing and left sided weakness. She  was taken to IR for endovascualr revascularization of B-MCA occlusions with TICI reperfusion bilaterally. Dr. Erlinda Hong recommended DAPT x 3 months followed by Plavix alone. EEG revealed mild generalized dysfunction and intermittent BUE twitching not associated with epileptiform activity. Follow up MRI brain reviewed, showing multiple infarcts.  Per report, numerous acute new supra and infratentorial infarcts involving B-MCA, and bilateral PCA- no hemorrhage.  She was briefly extubated on 8/20 but developed stridor with increase WOB requiring reintubation and beside tracheostomy on 8/23 by Dr. Nelda Marseille.   She was extubated to ATC and PMSV trails initiated with ST only. She has had issues with copious secretions as well as wet voice. Therapy re-evaluations done revealing diffuse weakness requiring total assist with BLE blocked for transfers and remains NPO. CIR recommended due to decline in mobility and  ability to carry out ADL tasks.    Review of Systems  Constitutional: Positive for diaphoresis and malaise/fatigue (bad night due to cough/deep suctioning ).  HENT: Positive for ear pain.   Eyes: Positive for blurred vision.       Loss of peripheral vision as well as additional deficits after cataract extraction  Respiratory: Positive for cough, shortness of breath and wheezing.   Cardiovascular: Negative for chest pain.  Gastrointestinal: Positive for abdominal pain and diarrhea.  Musculoskeletal: Negative for back pain.  Skin: Negative for rash.  Neurological: Positive for speech change, focal weakness and headaches.  All other systems reviewed and are negative.     Past Medical History:  Diagnosis Date  . Anemia    takes iron supplement  . Anesthesia complication    disorientation due to pseudotumor  . Diabetes mellitus    IDDM  . Family history of anesthesia complication 31 yrs ago   brother stopped breathing for a minute or two  . GERD (gastroesophageal reflux disease)   . Headache(784.0)    due to pseudotumor; daily headache  . Hypercholesteremia    unable to tolerate statins  . Hypertension    under control with meds., has been on med. x 5 yr.  . Peripheral neuropathy   . PONV (postoperative nausea and vomiting)   . 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 Seizures ([place])    due to shunt failure; last seizure 2007  . Synovitis of ankle 04/2013   left  . Wears dentures    full    Past Surgical History:  Procedure Laterality Date  . ANKLE ARTHROSCOPY Left 05/18/2013   Procedure:  LEFT ANKLE ARTHROSCOPY WITH DEBRIDEMENT,  SUBTALAR OPEN DEBRIDEMENT ;  Surgeon: Wylene Simmer, MD;  Location: [facility];  Service: Orthopedics;  Laterality: Left;  . APPENDECTOMY     as a child  . BALLOON DILATION N/A 07/04/2013   Procedure: BALLOON DILATION;  Surgeon: Garlan Fair, MD;  Location: [facility];  Service: Endoscopy;  Laterality: N/A;  .  BLADDER SUSPENSION    . BREAST LUMPECTOMY W/ NEEDLE LOCALIZATION Left 05/22/2011  . CAROTID ANGIOGRAPHY Bilateral 06/03/2017   Procedure: Bilateral Carotid Angiography;  Surgeon: Conrad Gustine, MD;  Location: [facility];  Service: Cardiovascular;  Laterality: Bilateral;  . COLONOSCOPY WITH PROPOFOL N/A 05/14/2014   Procedure: COLONOSCOPY WITH PROPOFOL;  Surgeon: Garlan Fair, MD;  Location: [facility];  Service: Endoscopy;  Laterality: N/A;  . ESOPHAGEAL DILATION  06/23/2006; 08/05/2004  . ESOPHAGOGASTRODUODENOSCOPY N/A 07/04/2013   Procedure: ESOPHAGOGASTRODUODENOSCOPY (EGD);  Surgeon: Garlan Fair, MD;  Location: [facility];  Service: Endoscopy;  Laterality: N/A;  . ESOPHAGOGASTRODUODENOSCOPY (EGD) WITH PROPOFOL N/A 05/14/2014   Procedure: ESOPHAGOGASTRODUODENOSCOPY (EGD) WITH PROPOFOL;  Surgeon: Garlan Fair, MD;  Location: [facility];  Service: Endoscopy;  Laterality: N/A;  . IR ANGIO VERTEBRAL SEL SUBCLAVIAN INNOMINATE UNI R MOD SED  06/03/2017  . IR PERCUTANEOUS ART THROMBECTOMY/INFUSION INTRACRANIAL INC DIAG ANGIO  06/03/2017  . IR PERCUTANEOUS ART THROMBECTOMY/INFUSION INTRACRANIAL INC DIAG ANGIO  06/03/2017  . LUMBAR PERITONEAL SHUNT     x 2  . OVARIAN CYST REMOVAL     age 63  . RADIOLOGY WITH ANESTHESIA N/A 06/03/2017   Procedure: RADIOLOGY WITH ANESTHESIA;  Surgeon: Luanne Bras, MD;  Location: [facility];  Service: Radiology;  Laterality: N/A;  . SHUNT REVISION  2007  . TOTAL ABDOMINAL HYSTERECTOMY W/ BILATERAL SALPINGOOPHORECTOMY  1994   age 42s  . WRIST SURGERY Left 2014    [name]    Family History  Problem Relation Age of Onset  . Heart disease Father   . Migraines Neg Hx     Social History:  Married--lives with family. Husband works part time. Independent with AD in the home. Per  reports that she has never smoked. She has never used smokeless tobacco. Per reports she does not drink alcohol or use drugs.    Allergies  Allergen Reactions  .  Carbamazepine Hives, Shortness Of Breath and Other (See Comments)  . Penicillins Anaphylaxis and Other (See Comments)    Mother, father and brother have history of anaphylaxis reaction to penicillin so pt does not take   . Statins Other (See Comments)    Severe muscle weakness, leg numbness, severe headaches, chest pain   . Tricyclic Antidepressants Other (See Comments)    IMPAIRED MEMORY  . Atorvastatin Other (See Comments)    Severe muscle weakness, leg numbness, severe headaches, chest pain   . Ezetimibe Other (See Comments)    Severe muscle weakness, leg numbness, severe headaches, chest pain   . Nortriptyline Other (See Comments)    IMPAIRED MEMORY  . Quinapril Hcl Other (See Comments)    Unknown reaction  . Rifampin Diarrhea  . Metoclopramide Nausea And Vomiting and Rash    Medications Prior to Admission  Medication Sig Dispense Refill  . aspirin EC 81 MG tablet Take 81 mg by mouth daily.    . cloNIDine (CATAPRES) 0.3 MG tablet Take 0.3 mg by mouth 2 (two) times daily.     . diclofenac sodium (VOLTAREN) 1 % GEL Apply 2 application topically 4 (four) times  daily as needed (pain).     Marland Kitchen esomeprazole (NEXIUM) 40 MG capsule Take 40 mg by mouth daily as needed (acid reflux). Acid reflux    . estropipate (OGEN) 1.5 MG tablet Take 0.75 mg by mouth daily.     . furosemide (LASIX) 20 MG tablet Take 20 mg by mouth daily.    Marland Kitchen gabapentin (NEURONTIN) 100 MG capsule Take 100 mg by mouth at bedtime.    Marland Kitchen glipiZIDE (GLUCOTROL) 10 MG tablet Take 10 mg by mouth 2 (two) times daily before a meal.     . insulin glargine (LANTUS) 100 unit/mL SOPN Inject 32 Units into the skin at bedtime.    . Melatonin 10 MG TABS Take 10 mg by mouth at bedtime as needed (sleep).    . metFORMIN (GLUCOPHAGE-XR) 500 MG 24 hr tablet Take 1,000 mg by mouth 3 (three) times daily with meals.    . metoprolol (LOPRESSOR) 50 MG tablet Take 50 mg by mouth at bedtime.     . metoprolol (TOPROL-XL) 100 MG 24 hr tablet Take  100 mg by mouth daily with breakfast.     . PRESCRIPTION MEDICATION Take 20 mg by mouth See admin instructions. Domperidone 10 mg from [place]: Take 2 tablets (20 mg) by mouth three times daily with meals - for diabetic gastroparesis    . telmisartan (MICARDIS) 80 MG tablet Take 80 mg by mouth at bedtime.     Marland Kitchen acetaZOLAMIDE (DIAMOX) 250 MG tablet Take 1 tablet (250 mg total) by mouth 3 (three) times daily. (Patient not taking: Reported on 05/31/2017) 90 tablet 12    Home: [place] expects to be discharged to:: Private residence Living Arrangements: Children, Spouse/significant other Available Help at Discharge: Family, Available 24 hours/day Type of Home: House Home Access: Stairs to enter [employer] of Steps: 4 Entrance Stairs-Rails: Right, Left, Can reach both Home Layout: One level Bathroom Shower/Tub: Multimedia programmer: Handicapped height Home Equipment: Grab bars - tub/shower, Grab bars - toilet, Shower seat, Cane - single point, Environmental consultant - 4 wheels  Functional History: Prior Function Level of Independence: Independent with assistive device(s), Needs assistance Gait / Transfers Assistance Needed: occasionally uses rollator  ADL's / Homemaking Assistance Needed: Pt reports she completes ADLs independently approx 90-95% of the time; intermittently needs assistance/supervision for bathing/dressing ADLs Functional Status:  Mobility: Bed Mobility Overal bed mobility: Needs Assistance Bed Mobility: Supine to Sit Supine to sit: Max assist, +2 for physical assistance Sit to supine: Total assist, +2 for physical assistance General bed mobility comments: Patient able to move LEs to EOB, +2 max assist to elevate trunk and rotate to EOB. increased time and effort to perform. patient with posterior bias throughout Transfers Overall transfer level: Needs assistance Equipment used: None Transfers: Sit to/from Stand, Google Transfers Sit to Stand:  Total assist, +2 physical assistance, +2 safety/equipment Squat pivot transfers: Total assist, +2 physical assistance General transfer comment: Patient attempting to intiate power up but required total assist to block bilateral LEs and transition trunk  Ambulation/Gait Ambulation/Gait assistance: Supervision Ambulation Distance (Feet): 210 Feet Assistive device: None Gait Pattern/deviations: Step-through pattern, Decreased stride length, Drifts right/left, Narrow base of support General Gait Details: Unable Gait velocity: decreased Gait velocity interpretation: Below normal speed for age/gender    ADL: ADL Overall ADL's : Needs assistance/impaired Eating/Feeding: Set up, Sitting Grooming: Moderate assistance, Sitting, Wash/dry face Grooming Details (indicate cue type and reason): sink level Upper Body Bathing: Min guard, Sitting Lower Body Bathing: Min guard,  Sit to/from stand Upper Body Dressing : Min guard, Sitting Lower Body Dressing: Maximal assistance, Bed level Lower Body Dressing Details (indicate cue type and reason): to don socks Toilet Transfer: Total assistance, +2 for physical assistance, Control and instrumentation engineer Details (indicate cue type and reason): Simulated by transfer EOB>chair Toileting- Clothing Manipulation and Hygiene: Modified independent, Sit to/from stand Toileting - Clothing Manipulation Details (indicate cue type and reason): hospital gown and peri care Functional mobility during ADLs: Total assistance, +2 for physical assistance (squat pivot only) General ADL Comments: Pt tolerated sitting EOB x7 minutes with max posterior support. VSS thoughout.  Cognition: Cognition Overall Cognitive Status: Difficult to assess Orientation Level: Intubated/Tracheostomy - Unable to assess Cognition Arousal/Alertness: Awake/alert Behavior During Therapy: Restless Overall Cognitive Status: Difficult to assess General Comments: Folowing simple commands with improved  consistency Difficult to assess due to: Tracheostomy   Blood pressure (!) 151/63, pulse 93, temperature 98.6 F (37 C), temperature source Oral, resp. rate (!) 22, height 5\' 4"  (1.626 m), weight 75.5 kg (166 lb 7.2 oz), SpO2 96 %. Physical Exam  Nursing note and vitals reviewed. Constitutional: She appears well-developed and well-nourished. No distress.  HENT:  Head: Normocephalic and atraumatic.  Mouth/Throat: Oropharynx is clear and moist.  +NG  Eyes: Pupils are equal, round, and reactive to light. Conjunctivae and EOM are normal.  Neck: Normal range of motion.  Cuffed 6 tach in place with occasional stridor? with cough  Cardiovascular: Normal rate and regular rhythm.   Respiratory: Effort normal. No stridor. She has wheezes. She exhibits no tenderness.  Frequent silent cough-- productive with mucoid secretions via trach  GI: Soft. Bowel sounds are normal. She exhibits distension. There is tenderness.  Genitourinary:  Genitourinary Comments: Foley and rectal tube in place.   Musculoskeletal: She exhibits no edema or tenderness.  Neurological: She is alert.  Aphonic.  Flat and distracted.  Limited by coughing episodes.  Able to follow simple one and two step commands.  Motor: RUE/RLE: shoulder abduction, elbow flex/ext 3/5, hand grip 1/5 RLE: HF, KE, ADF/PF 3+/5 LUE/LLE: 4-/5 proximal to distal A&Ox2  Skin: Skin is warm and dry. No rash noted. She is not diaphoretic.  Psychiatric: Her affect is blunt. She is withdrawn.    Results for orders placed or performed during the hospital encounter of 05/31/17 (from the past 24 hour(s))  Glucose, capillary     Status: Abnormal   Collection Time: 06/13/17 11:55 AM  Result Value Ref Range   Glucose-Capillary 157 (H) 65 - 99 mg/dL  Glucose, capillary     Status: Abnormal   Collection Time: 06/13/17  5:03 PM  Result Value Ref Range   Glucose-Capillary 148 (H) 65 - 99 mg/dL  Glucose, capillary     Status: Abnormal   Collection Time:  06/13/17  9:17 PM  Result Value Ref Range   Glucose-Capillary 147 (H) 65 - 99 mg/dL  Glucose, capillary     Status: Abnormal   Collection Time: 06/13/17 11:37 PM  Result Value Ref Range   Glucose-Capillary 203 (H) 65 - 99 mg/dL  CBC     Status: Abnormal   Collection Time: 06/14/17  3:09 AM  Result Value Ref Range   WBC 11.0 (H) 4.0 - 10.5 K/uL   RBC 2.96 (L) 3.87 - 5.11 MIL/uL   Hemoglobin 8.1 (L) 12.0 - 15.0 g/dL   HCT 26.5 (L) 36.0 - 46.0 %   MCV 89.5 78.0 - 100.0 fL   MCH 27.4 26.0 - 34.0 pg   MCHC  30.6 30.0 - 36.0 g/dL   RDW 14.5 11.5 - 15.5 %   Platelets 405 (H) 150 - 400 K/uL  Basic metabolic panel     Status: Abnormal   Collection Time: 06/14/17  3:09 AM  Result Value Ref Range   Sodium 141 135 - 145 mmol/L   Potassium 3.0 (L) 3.5 - 5.1 mmol/L   Chloride 105 101 - 111 mmol/L   CO2 27 22 - 32 mmol/L   Glucose, Bld 78 65 - 99 mg/dL   BUN 40 (H) 6 - 20 mg/dL   Creatinine, Ser 0.88 0.44 - 1.00 mg/dL   Calcium 8.5 (L) 8.9 - 10.3 mg/dL   GFR calc non Af Amer >60 >60 mL/min   GFR calc Af Amer >60 >60 mL/min   Anion gap 9 5 - 15  Glucose, capillary     Status: None   Collection Time: 06/14/17  3:24 AM  Result Value Ref Range   Glucose-Capillary 65 65 - 99 mg/dL  Glucose, capillary     Status: Abnormal   Collection Time: 06/14/17  3:46 AM  Result Value Ref Range   Glucose-Capillary 57 (L) 65 - 99 mg/dL  Glucose, capillary     Status: Abnormal   Collection Time: 06/14/17  4:23 AM  Result Value Ref Range   Glucose-Capillary 140 (H) 65 - 99 mg/dL  Glucose, capillary     Status: Abnormal   Collection Time: 06/14/17  8:17 AM  Result Value Ref Range   Glucose-Capillary 168 (H) 65 - 99 mg/dL   Dg Chest Port 1 View  Result Date: 06/14/2017 CLINICAL DATA:  Tracheostomy. EXAM: PORTABLE CHEST 1 VIEW COMPARISON:  Radiograph of June 10, 2017. FINDINGS: Stable cardiomediastinal silhouette. Tracheostomy tube and feeding tube are unchanged in position. No pneumothorax is noted.  Right lung is clear. Minimal left basilar subsegmental atelectasis with associated pleural effusion is noted. Bony thorax is unremarkable. IMPRESSION: Minimal left basilar subsegmental atelectasis with associated minimal pleural effusion. Electronically Signed   By: Marijo Conception, M.D.   On: 06/14/2017 07:27    Assessment/Plan: Diagnosis: Bilateral strokes Labs and images independently reviewed.  Records reviewed and summated above. Stroke: Continue secondary stroke prophylaxis and Risk Factor Modification listed below:   Antiplatelet therapy:   Blood Pressure Management:  Continue current medication with prn's with permisive HTN per primary team Statin Agent:   Diabetes management:   R>L sided hemiparesis: fit for orthosis to prevent contractures (resting hand splint for day, wrist cock up splint at night, PRAFO, etc) Motor recovery: Fluoxetine  1. Does the need for close, 24 hr/day medical supervision in concert with the patient's rehab needs make it unreasonable for this patient to be served in a less intensive setting? Yes and Potentially 2. Co-Morbidities requiring supervision/potential complications: DM (Monitor in accordance with exercise and adjust meds as necessary), HTN (monitor and provide prns in accordance with increased physical exertion and pain), peripheral neuropathy (cont to monitor, meds if necessary), pseudotumor cerebri, seizure disorder (cont meds), hypokalemia (continue to monitor and replete as necessary), leukocytosis (cont to monitor for signs and symptoms of infection, further workup if indicated), ABLA (transfuse if necessary to ensure appropriate perfusion for increased activity tolerance), post-op pain (Biofeedback training with therapies to help reduce reliance on opiate and IV pain medications, particularly IV Toradol, monitor pain control during therapies, and sedation at rest and titrate to maximum efficacy to ensure participation and gains in therapies), dysphagia  (advance diet as tolerated) 3. Due to bladder management,  bowel management, safety, disease management, medication administration, pain management and patient education, does the patient require 24 hr/day rehab nursing? Yes 4. Does the patient require coordinated care of a physician, rehab nurse, PT (1-2 hrs/day, 5 days/week), OT (1-2 hrs/day, 5 days/week) and SLP (1-2 hrs/day, 5 days/week) to address physical and functional deficits in the context of the above medical diagnosis(es)? Yes Addressing deficits in the following areas: balance, endurance, locomotion, strength, transferring, bowel/bladder control, bathing, dressing, feeding, grooming, toileting, cognition, speech, language, swallowing and psychosocial support 5. Can the patient actively participate in an intensive therapy program of at least 3 hrs of therapy per day at least 5 days per week? Potentially 6. The potential for patient to make measurable gains while on inpatient rehab is good 7. Anticipated functional outcomes upon discharge from inpatient rehab are mod assist  with PT, mod assist with OT, min assist and mod assist with SLP. 8. Estimated rehab length of stay to reach the above functional goals is: 22-27 days. 9. Anticipated D/C setting: Other 10. Anticipated post D/C treatments: ?LTACH 11. Overall Rehab/Functional Prognosis: good  RECOMMENDATIONS: This patient's condition is appropriate for continued rehabilitative care in the following setting: Will cont to follow as medical issues stabalize and patient able to tolerate 3 hours therapy/day.  However, at presents, pt will likely benefit from [facility]. Patient has agreed to participate in recommended program. Potentially Note that insurance prior authorization may be required for reimbursement for recommended care.  Comment: Rehab Admissions Coordinator to follow up.  Delice Lesch, MD, [address] [place] 06/14/2017

## 2017-06-14 NOTE — Progress Notes (Signed)
Inpatient Rehabilitation  Met with patient and daughter at bedside today to discuss team's recommendation for IP Rehab.  Shared booklets and answered questions.  They stated they are in favor of IP Rehab for post acute therapies and plan to also discuss it with patient's spouse tonight.  Note ongoing secretion management issues due to viscosity and well as question for GI eval.  Plan to follow along for timing of medical readiness, insurance authorization, and IP Rehab bed availability.  Please call with questions.   Carmelia Roller., CCC/SLP Admission Coordinator  [facility]  Cell [phone]

## 2017-06-14 NOTE — Progress Notes (Signed)
PULMONARY / CRITICAL CARE MEDICINE   Name: [name] MRN: [id number] DOB: 1951/03/22    ADMISSION DATE:  05/31/2017 CONSULTATION DATE:  8/16  REFERRING MD:  Erlinda Hong   CHIEF COMPLAINT:  Vent management   HISTORY OF PRESENT ILLNESS:   66yo female with hx HTN, DM who presented initially 8/13 to her PCP with headache, R sided weakness and vision changes.  She was sent for MRI which revealed multiple areas of acute L MCA infarct.  She was sent to ER and admitted for CVA workup. On 8/16 pt was in cath lab for diagnostic carotid and cerebral angiogram with vascular surgery when she became suddenly unresponsive with L sided weakness and possible L sided seizure-like activity.  CTA revealed bilateral M1 occlusions.  She was intubated and taken urgently to IR for revascularization and rapid resolution of embolism.  PCCM consulted for ICU/vent management post procedure.   SUBJECTIVE:   Awake, no distress but coughing a significant amount   VITAL SIGNS: BP (!) 107/47   Pulse 77   Temp 98.6 F (37 C) (Oral)   Resp 16   Ht 5\' 4"  (1.626 m)   Wt 166 lb 7.2 oz (75.5 kg)   SpO2 100%   BMI 28.57 kg/m   HEMODYNAMICS:    VENTILATOR SETTINGS: FiO2 (%):  [28 %] 28 %  INTAKE / OUTPUT:  Intake/Output Summary (Last 24 hours) at 06/14/17 1136 Last data filed at 06/14/17 0600  Gross per 24 hour  Intake              854 ml  Output              925 ml  Net              -71 ml   General appearance:  66 Year old  Female, well nourished,  NAD, conversant  Eyes: anicteric sclerae, moist conjunctivae; PERRL, EOMI bilaterally. Neck: Trachea midline; neck supple, no JVD, # 6 cuffed trach  Lungs/chest: scattered rhonchi, with normal respiratory effort and no intercostal retractions CV: RRR, no MRGs  Abdomen: Soft, non-tender; no masses or HSM Extremities: No peripheral edema or extremity lymphadenopathy Skin: Normal temperature, turgor and texture; no rash, ulcers or subcutaneous nodules Psych:  Appropriate affect, alert and oriented to person, place and time  LABS:  BMET  Recent Labs Lab 06/12/17 0530 06/13/17 0250 06/14/17 0309  NA 140 144 141  K 2.9* 4.1 3.0*  CL 97* 105 105  CO2 30 28 27   BUN 36* 36* 40*  CREATININE 0.83 0.82 0.88  GLUCOSE 209* 67 78   Electrolytes  Recent Labs Lab 06/09/17 2346 06/10/17 0630 06/12/17 0530 06/13/17 0250 06/14/17 0309  CALCIUM 8.4* 8.2* 8.6* 9.1 8.5*  MG 1.8 1.8  --  2.2  --   PHOS  --   --   --  2.3*  --     CBC  Recent Labs Lab 06/10/17 0630 06/13/17 0250 06/14/17 0309  WBC 15.3* 12.5* 11.0*  HGB 8.9* 9.6* 8.1*  HCT 27.2* 30.5* 26.5*  PLT 362 480* 405*   Coag's  Recent Labs Lab 06/10/17 0630 06/10/17 0808  INR QUESTIONABLE RESULTS, RECOMMEND RECOLLECT TO VERIFY 0.99   Sepsis Markers No results for input(s): LATICACIDVEN, PROCALCITON, O2SATVEN in the last 168 hours.  ABG  Recent Labs Lab 06/07/17 1542  PHART 7.421  PCO2ART 43.7  PO2ART 393.0*   Liver Enzymes No results for input(s): AST, ALT, ALKPHOS, BILITOT, ALBUMIN in the last 168 hours.  Cardiac Enzymes No results for input(s): TROPONINI, PROBNP in the last 168 hours.  Glucose  Recent Labs Lab 06/13/17 2117 06/13/17 2337 06/14/17 0324 06/14/17 0346 06/14/17 0423 06/14/17 0817  GLUCAP 147* 203* 65 57* 140* 168*   Imaging Dg Chest Port 1 View  Result Date: 06/14/2017 CLINICAL DATA:  Tracheostomy. EXAM: PORTABLE CHEST 1 VIEW COMPARISON:  Radiograph of June 10, 2017. FINDINGS: Stable cardiomediastinal silhouette. Tracheostomy tube and feeding tube are unchanged in position. No pneumothorax is noted. Right lung is clear. Minimal left basilar subsegmental atelectasis with associated pleural effusion is noted. Bony thorax is unremarkable. IMPRESSION: Minimal left basilar subsegmental atelectasis with associated minimal pleural effusion. Electronically Signed   By: Marijo Conception, M.D.   On: 06/14/2017 07:27   Dg Abd Portable  1v  Result Date: 06/14/2017 CLINICAL DATA:  Abdominal pain and constipation. EXAM: PORTABLE ABDOMEN - 1 VIEW COMPARISON:  Lower chest and upper abdominal radiograph of June 04, 2017 FINDINGS: The feeding tube tip projects in the distal duodenum or proximal jejunum. There is no small or large bowel obstructive pattern. The stool and gas pattern within the colon and rectum appears normal. There is a radiodense structure that projects in the region of the anal verge that is of uncertain significance. The bony structures exhibit no acute abnormalities. IMPRESSION: No evidence of bowel obstruction or ileus. The colonic stool burden does not appear excessive. The feeding tube tip projects in the distal duodenum or proximal jejunum. Electronically Signed   By: David  Martinique M.D.   On: 06/14/2017 09:55    STUDIES:  MR brain 8/12>>> Multiple small areas of acute infarct in the left parietal cortex and left occipital cortex. Recommend CTA head and neck to evaluate for source of emboli or stenosis causing these infarcts. Numerous chronic white matter infarcts bilaterally CTA head 8/16>>> 1. Right M1 embolus with subtotal occlusion but severe stenosis. The anterior temporal branch is covered and non-opacified. 2. Elongated embolus within the left M1 and proximal M2 vessels. 3. Negative for dissection or interval ulceration in the arch and cervical carotids. 4. Carotid bifurcation atherosclerosis with approximately 50-60% left ICA bulb narrowing. Visually the stenosis appears worse due to positive remodeling at the low-density plaque. 5. Moderate atheromatous narrowing of the right V4 and mid basilar. CT head 8/16>>> 1. Several small foci of enhancement within bilateral parietal and left frontal cortex as well as left basal ganglia probably representing enhancing infarctions. 2. No large hemorrhage, focal mass effect, herniation, or Hydrocephalus. 2D echo 8/14>>>  - Left ventricle: The cavity size was  normal. Systolic function was normal. The estimated ejection fraction was in the range of 60% to 65%. Wall motion was normal; there were no regional wall motion abnormalities. Doppler parameters are consistent with abnormal left ventricular relaxation (grade 1 diastolic dysfunction). MRI:  06/04/2017>>> Numerous acute new supra- and infratentorial infarcts spanning multiple vascular territories consistent with embolic phenomena. Nohemorrhagic conversion.Moderate chronic small vessel ischemic disease.  CULTURES: Tracheal aspirate 8/20 >> MSSA  ANTIBIOTICS: cefazolin 8/22 >>   SIGNIFICANT EVENTS: 8/16 - sudden change in mental status, to IR for bilateral embolectomy for revascularization of bilat MCA occlusions   LINES/TUBES: ETT 8/16>>>8/20>>>8/23 R rad aline 8/16>>>8/17 Trach 8/23>>>  I reviewed CXR myself, trach is in good position.    ASSESSMENT / PLAN:  Bilateral acute MCA occlusion- s/p IR revascularization  Left ICA stenosis Acute respiratory failure - r/t AMS  Stridor, vocal cord edema Tracheostomy status  Hx HTN  Vomited 8/17 , Coretrack  out 8/17  ABD film neg 8/17  Gastroparesis  Bilateral MCA occlusion  L ICA stenosis  Leukocytosis>> ? Reactive>resolved  MSSA bronchitis versus pneumonia DM - Hgb A1c >9   66yo female admitted with acute L MCA stroke, then acute R MCA occlusion during diagnostic arteriogram with AMS requiring intubation.  Taken to IR for revascularization. Reintubated on 8/20 for stridor. Left lower lobe atelectasis/infiltrate, MSSA on respiratory culture. Start antibiotics 8/22.   Pulmonary problem list  Tracheostomy dependence s/p acute CVA c/w post-intubation stridor, poor airway clearance and strep (beta-hemolytic & MSSA PNA) 8/25 to 8/27:  Seen by SLP working on PMV Had what sounds like reflux +/- penetration after bedside swallow eval ->i think that this is the biggest issue (gastroparesis/reflux)  Plan Reflux precautions Cont  domperidone Cont routine trach care  prob down size to cuffless  8/29 May need GI eval for gastroparesis if don't make much progress  Erick Colace ACNP-BC [place] Pager # [phone] OR # [phone] if no answer  Attending Note:  67 year old female admitted with acute L MCA CVA that caused inability to protect her airway and required trach placement.  Patient is doing well on TC with coarse BS bilaterally.  I reviewed CXR myself, trach in good position.  Respiratory failure:  - Maintain on TC as tolerated, no vent unless necessary at this point  Hypoxemia:  - Titrate O2 for sat of 88-92%.  Trach status:  - Maintain current trach size and type  - If remains off the vent will change to a cuffless 4 on Wednesday.  Dysphagia:  - Need to determine need for PEG.  PCCM will continue to follow  Patient seen and examined, agree with above note.  I dictated the care and orders written for this patient under my direction.  Rush Farmer, [place]

## 2017-06-15 DIAGNOSIS — Z79890 Hormone replacement therapy (postmenopausal): Secondary | ICD-10-CM

## 2017-06-15 DIAGNOSIS — R1312 Dysphagia, oropharyngeal phase: Secondary | ICD-10-CM

## 2017-06-15 DIAGNOSIS — R4182 Altered mental status, unspecified: Secondary | ICD-10-CM

## 2017-06-15 DIAGNOSIS — J9621 Acute and chronic respiratory failure with hypoxia: Secondary | ICD-10-CM

## 2017-06-15 LAB — BASIC METABOLIC PANEL
Anion gap: 9 (ref 5–15)
BUN: 33 mg/dL — AB (ref 6–20)
CHLORIDE: 106 mmol/L (ref 101–111)
CO2: 23 mmol/L (ref 22–32)
CREATININE: 0.82 mg/dL (ref 0.44–1.00)
Calcium: 8.1 mg/dL — ABNORMAL LOW (ref 8.9–10.3)
GFR calc Af Amer: >60 mL/min (ref >60)
GFR calc non Af Amer: >60 mL/min (ref >60)
Glucose, Bld: 248 mg/dL — ABNORMAL HIGH (ref 65–99)
Potassium: 4 mmol/L (ref 3.5–5.1)
SODIUM: 138 mmol/L (ref 135–145)

## 2017-06-15 LAB — GLUCOSE, CAPILLARY
GLUCOSE-CAPILLARY: 209 mg/dL — AB (ref 65–99)
Glucose-Capillary: 73 mg/dL (ref 65–99)
Glucose-Capillary: 218 mg/dL — ABNORMAL HIGH (ref 65–99)
Glucose-Capillary: 166 mg/dL — ABNORMAL HIGH (ref 65–99)
Glucose-Capillary: 151 mg/dL — ABNORMAL HIGH (ref 65–99)

## 2017-06-15 LAB — CBC
HEMATOCRIT: 25 % — AB (ref 36.0–46.0)
Hemoglobin: 7.6 g/dL — ABNORMAL LOW (ref 12.0–15.0)
MCH: 27.3 pg (ref 26.0–34.0)
MCHC: 30.4 g/dL (ref 30.0–36.0)
MCV: 89.9 fL (ref 78.0–100.0)
Platelets: 377 10*3/uL (ref 150–400)
RBC: 2.78 MIL/uL — ABNORMAL LOW (ref 3.87–5.11)
RDW: 14.1 % (ref 11.5–15.5)
WBC: 10.4 10*3/uL (ref 4.0–10.5)

## 2017-06-15 LAB — MAGNESIUM: MAGNESIUM: 1.8 mg/dL (ref 1.7–2.4)

## 2017-06-15 MED ORDER — VITAL 1.5 CAL PO LIQD
1000.0000 mL | ORAL | Status: DC
  Administered 2017-06-15 – 2017-06-18 (×5): 1000 mL
  Filled 2017-06-15 (×5): qty 1000

## 2017-06-15 MED ORDER — PRO-STAT SUGAR FREE PO LIQD
30.0000 mL | Freq: Every day | ORAL | Status: DC
  Administered 2017-06-15 – 2017-06-18 (×4): 30 mL
  Filled 2017-06-15 (×5): qty 30

## 2017-06-15 MED ORDER — ALBUTEROL SULFATE (2.5 MG/3ML) 0.083% IN NEBU: 2.5000 mg | INHALATION_SOLUTION | Freq: Four times a day (QID) | RESPIRATORY_TRACT | Status: DC | PRN

## 2017-06-15 MED ORDER — MORPHINE SULFATE (PF) 4 MG/ML IV SOLN
1.0000 mg | INTRAVENOUS | Status: DC | PRN
  Administered 2017-06-15: 2 mg via INTRAVENOUS
  Filled 2017-06-15: qty 1

## 2017-06-15 MED ORDER — ALBUTEROL SULFATE (2.5 MG/3ML) 0.083% IN NEBU: 2.5000 mg | INHALATION_SOLUTION | RESPIRATORY_TRACT | Status: DC | PRN

## 2017-06-15 MED ORDER — ACETYLCYSTEINE 20 % IN SOLN
2.0000 mL | Freq: Four times a day (QID) | RESPIRATORY_TRACT | Status: DC
  Administered 2017-06-15 – 2017-06-16 (×5): 2 mL via RESPIRATORY_TRACT
  Administered 2017-06-16 – 2017-06-17 (×2): 4 mL via RESPIRATORY_TRACT
  Administered 2017-06-17 – 2017-06-18 (×3): 2 mL via RESPIRATORY_TRACT
  Filled 2017-06-15 (×10): qty 4

## 2017-06-15 NOTE — Progress Notes (Signed)
Nutrition Follow Up   DOCUMENTATION CODES:   Not applicable  INTERVENTION:    Vital 1.5 at goal rate of 50 ml/hr with Prostat 30 ml daily  TF regimen to provide 1900 kcals, 96 gm protein, 916 ml of free water daily  NUTRITION DIAGNOSIS:   Inadequate oral intake related to inability to eat as evidenced by NPO status, ongoing  GOAL:   Patient will meet greater than or equal to 90% of their needs  MONITOR:   TF tolerance, Diet advancement, Labs, Weight trends, Skin, I & O's  ASSESSMENT:   66 yo female with PMH of GERD, seizures, anemia, HTN, DM, peripheral neuropathy, HLD, pseudotumor cerebri who was admitted on 8/13 with acute R hemispheric stroke s/p thrombectomy 8/16, remained intubated afterward.   8/20 Cortrak placed 8/23 Beside trach placed   Pt now on trach collar. S/p bedside swallow evaluation 8/27. SLP rec continued NPO status. Vital 1.5 formula infusing at 25 ml/hr via Cortrak tube with Prostat 30 ml QID. Labs and medications reviewed. K 3.0 (L). CBG's [id number].  Diet Order:  Diet NPO time specified  Skin:   (R groin incision)  Last BM:  8/28  Height:   Ht Readings from Last 1 Encounters:  06/07/17 5\' 4"  (1.626 m)   Weight:   Wt Readings from Last 1 Encounters:  06/15/17 170 lb 10.2 oz (77.4 kg)   Ideal Body Weight:  54.5 kg  BMI:  Body mass index is 29.29 kg/m.  Estimated Nutritional Needs:   Kcal:  1800-2000  Protein:  90-105 gm  Fluid:  1.8-2.0 L  EDUCATION NEEDS:   No education needs identified at this time  Arthur Holms, RD, LDN Pager #: [phone] After-Hours Pager #: [phone]

## 2017-06-15 NOTE — Progress Notes (Signed)
PULMONARY / CRITICAL CARE MEDICINE   Name: [name] MRN: [id number] DOB: [birth year]    ADMISSION DATE:  05/31/2017 CONSULTATION DATE:  8/16  REFERRING MD:  Erlinda Hong   CHIEF COMPLAINT:  Vent management   HISTORY OF PRESENT ILLNESS:   66yo female with hx HTN, DM who presented initially 8/13 to her PCP with headache, R sided weakness and vision changes.  She was sent for MRI which revealed multiple areas of acute L MCA infarct.  She was sent to ER and admitted for CVA workup. On 8/16 pt was in cath lab for diagnostic carotid and cerebral angiogram with vascular surgery when she became suddenly unresponsive with L sided weakness and possible L sided seizure-like activity.  CTA revealed bilateral M1 occlusions.  She was intubated and taken urgently to IR for revascularization and rapid resolution of embolism.  PCCM consulted for ICU/vent management post procedure.   SUBJECTIVE:   Feeling stronger   VITAL SIGNS: BP (!) 122/56   Pulse 69   Temp 98.2 F (36.8 C) (Oral)   Resp 16   Ht 5\' 4"  (1.626 m)   Wt 170 lb 10.2 oz (77.4 kg)   SpO2 100%   BMI 29.29 kg/m   HEMODYNAMICS:    VENTILATOR SETTINGS: FiO2 (%):  [28 %] 28 %  INTAKE / OUTPUT:  Intake/Output Summary (Last 24 hours) at 06/15/17 1123 Last data filed at 06/15/17 0600  Gross per 24 hour  Intake             1575 ml  Output             1000 ml  Net              575 ml   General appearance:  66 Year female, well nourished  NAD, conversant  Eyes: anicteric sclerae, moist conjunctivae; PERRL, EOMI bilaterally. Mouth:  membranes and no mucosal ulcerations; normal hard and soft palate Neck: Trachea midline; neck supple, no JVD # 6 cuffless trach w/ yellow tinged secretions Lungs/chest: scattered rhonchi, with normal respiratory effort and no intercostal retractions CV: RRR, no MRGs  Abdomen: Soft, non-tender; no masses or HSM Extremities: No peripheral edema or extremity lymphadenopathy Skin: Normal temperature, turgor  and texture; no rash, ulcers or subcutaneous nodules Psych: Appropriate affect, alert and oriented to person, place and time  LABS:  BMET  Recent Labs Lab 06/12/17 0530 06/13/17 0250 06/14/17 0309  NA 140 144 141  K 2.9* 4.1 3.0*  CL 97* 105 105  CO2 30 28 27   BUN 36* 36* 40*  CREATININE 0.83 0.82 0.88  GLUCOSE 209* 67 78   Electrolytes  Recent Labs Lab 06/09/17 2346 06/10/17 0630 06/12/17 0530 06/13/17 0250 06/14/17 0309  CALCIUM 8.4* 8.2* 8.6* 9.1 8.5*  MG 1.8 1.8  --  2.2  --   PHOS  --   --   --  2.3*  --     CBC  Recent Labs Lab 06/10/17 0630 06/13/17 0250 06/14/17 0309  WBC 15.3* 12.5* 11.0*  HGB 8.9* 9.6* 8.1*  HCT 27.2* 30.5* 26.5*  PLT 362 480* 405*   Coag's  Recent Labs Lab 06/10/17 0630 06/10/17 0808  INR QUESTIONABLE RESULTS, RECOMMEND RECOLLECT TO VERIFY 0.99   Sepsis Markers No results for input(s): LATICACIDVEN, PROCALCITON, O2SATVEN in the last 168 hours.  ABG No results for input(s): PHART, PCO2ART, PO2ART in the last 168 hours. Liver Enzymes No results for input(s): AST, ALT, ALKPHOS, BILITOT, ALBUMIN in the last 168 hours.  Cardiac Enzymes No results for input(s): TROPONINI, PROBNP in the last 168 hours.  Glucose  Recent Labs Lab 06/14/17 1224 06/14/17 1653 06/14/17 2011 06/14/17 2344 06/15/17 0410 06/15/17 0803  GLUCAP 125* 119* 267* 155* 73 218*   Imaging No results found.  STUDIES:  MR brain 8/12>>> Multiple small areas of acute infarct in the left parietal cortex and left occipital cortex. Recommend CTA head and neck to evaluate for source of emboli or stenosis causing these infarcts. Numerous chronic white matter infarcts bilaterally CTA head 8/16>>> 1. Right M1 embolus with subtotal occlusion but severe stenosis. The anterior temporal branch is covered and non-opacified. 2. Elongated embolus within the left M1 and proximal M2 vessels. 3. Negative for dissection or interval ulceration in the arch  and cervical carotids. 4. Carotid bifurcation atherosclerosis with approximately 50-60% left ICA bulb narrowing. Visually the stenosis appears worse due to positive remodeling at the low-density plaque. 5. Moderate atheromatous narrowing of the right V4 and mid basilar. CT head 8/16>>> 1. Several small foci of enhancement within bilateral parietal and left frontal cortex as well as left basal ganglia probably representing enhancing infarctions. 2. No large hemorrhage, focal mass effect, herniation, or Hydrocephalus. 2D echo 8/14>>>  - Left ventricle: The cavity size was normal. Systolic function was normal. The estimated ejection fraction was in the range of 60% to 65%. Wall motion was normal; there were no regional wall motion abnormalities. Doppler parameters are consistent with abnormal left ventricular relaxation (grade 1 diastolic dysfunction). MRI:  06/04/2017>>> Numerous acute new supra- and infratentorial infarcts spanning multiple vascular territories consistent with embolic phenomena. [name] conversion.Moderate chronic small vessel ischemic disease.  CULTURES: Tracheal aspirate 8/20 >> MSSA  ANTIBIOTICS: cefazolin 8/22 >>   SIGNIFICANT EVENTS: 8/16 - sudden change in mental status, to IR for bilateral embolectomy for revascularization of bilat MCA occlusions   LINES/TUBES: ETT 8/16>>>8/20>>>8/23 R rad aline 8/16>>>8/17 Trach 8/23>>>  I reviewed CXR myself, trach is in good position.    ASSESSMENT / PLAN:  Bilateral acute MCA occlusion- s/p IR revascularization  Left ICA stenosis Acute respiratory failure - r/t AMS  Stridor, vocal cord edema Tracheostomy status  Hx HTN  Vomited 8/17 , Coretrack out 8/17  ABD film neg 8/17  Gastroparesis  Bilateral MCA occlusion  L ICA stenosis  Leukocytosis>> ? Reactive>resolved  MSSA bronchitis versus pneumonia DM - Hgb A1c >9   66yo female admitted with acute L MCA stroke, then acute R MCA occlusion during  diagnostic arteriogram with AMS requiring intubation.  Taken to IR for revascularization. Reintubated on 8/20 for stridor. Left lower lobe atelectasis/infiltrate, MSSA on respiratory culture. Start antibiotics 8/22.   Pulmonary problem list  Tracheostomy dependence s/p acute CVA c/w post-intubation stridor, poor airway clearance and strep (beta-hemolytic & MSSA PNA) 8/25 to 8/27:  Seen by SLP working on PMV Had what sounds like reflux +/- penetration after bedside swallow eval ->cont think that this is the biggest issue (gastroparesis/reflux).  ->ssen by IR who recommends LTAC Plan Cont reflux and aspiration precautions abx per primary  Cont rehab efforts Down size on 8/29 Cont domperidone   Erick Colace ACNP-BC [place] Pager # [phone] OR # [phone] if no answer  Attending Note:  66 year old female admitted with acute L MCA CVA that caused inability to protect her airway and required trach placement.  Patient is doing well on TC with coarse BS bilaterally.  I reviewed CXR myself, trach in good position.  Respiratory failure:             -  Maintain on TC as tolerated  Hypoxemia:             - Titrate O2 for sat of 88-92%  Trach status:             - Maintain current trach size and type             - Plan to downsize to 4 cuffless in AM  Dysphagia:             - SLP after trach downsize to determine if she will need a PEG or not  PCCM will continue to follow  Patient seen and examined, agree with above note.  I dictated the care and orders written for this patient under my direction.  Rush Farmer, [place]

## 2017-06-15 NOTE — Progress Notes (Signed)
STROKE TEAM PROGRESS NOTE   SUBJECTIVE (INTERVAL HISTORY) Her husband is at the bedside.  Patient is still on trach collar. Still complains of right ear pain, not effective for the ear drop, but no hearing loss. Secretions are better controlled. On neb and IVF.   OBJECTIVE Temp:  [97.7 F (36.5 C)-99.2 F (37.3 C)] 99 F (37.2 C) (08/28 1202) Pulse Rate:  [69-90] 73 (08/28 1202) Cardiac Rhythm: Normal sinus rhythm (08/28 0800) Resp:  [13-23] 20 (08/28 1202) BP: (112-153)/(52-118) 122/56 (08/28 1121) SpO2:  [95 %-100 %] 98 % (08/28 1202) FiO2 (%):  [28 %] 28 % (08/28 1121) Weight:  [170 lb 10.2 oz (77.4 kg)] 170 lb 10.2 oz (77.4 kg) (08/28 0420)  CBC:   Recent Labs Lab 06/13/17 0250 06/14/17 0309  WBC 12.5* 11.0*  HGB 9.6* 8.1*  HCT 30.5* 26.5*  MCV 89.4 89.5  PLT 480* 405*    Basic Metabolic Panel:   Recent Labs Lab 06/10/17 0630  06/13/17 0250 06/14/17 0309  NA 140  < > 144 141  K 3.7  < > 4.1 3.0*  CL 107  < > 105 105  CO2 26  < > 28 27  GLUCOSE 152*  < > 67 78  BUN 29*  < > 36* 40*  CREATININE 0.86  < > 0.82 0.88  CALCIUM 8.2*  < > 9.1 8.5*  MG 1.8  --  2.2  --   PHOS  --   --  2.3*  --   < > = values in this interval not displayed.  Lipid Panel:     Component Value Date/Time   CHOL 297 (H) 06/02/2017 0741   TRIG 220 (H) 06/10/2017 0630   HDL 49 06/02/2017 0741   CHOLHDL 6.1 06/02/2017 0741   VLDL 37 06/02/2017 0741   LDLCALC 211 (H) 06/02/2017 0741   HgbA1c:  Lab Results  Component Value Date   HGBA1C 9.5 (H) 06/02/2017   Urine Drug Screen: No results found for: LABOPIA, COCAINSCRNUR, LABBENZ, AMPHETMU, THCU, LABBARB  Alcohol Level No results found for: Johnson I have personally reviewed the radiological images below and agree with the radiology interpretations.  Ct Angio Head and neck W Or Wo Contrast 05/31/2017 IMPRESSION: 1. No emergent large vessel occlusion. 2. Large amount of noncalcified plaque at the proximal left internal  carotid artery, causing 60-65% stenosis. This is the most likely source of emboli for the previously identified left parietal and occipital infarcts. 3. No other intracranial or cervical arterial stenosis. 4. Minimal Aortic Atherosclerosis (ICD10-I70.0).   Mr Jeri Cos Wo Contrast 05/30/2017 IMPRESSION: Multiple small areas of acute infarct in the left parietal cortex and left occipital cortex. Recommend CTA head and neck to evaluate for source of emboli or stenosis causing these infarcts Numerous chronic white matter infarcts bilaterally   CUS 06/01/2017 1-39% right internal carotid artery stenosis and 40-59% left internal carotid artery stenosis. Vertebral arteries are patent with antegrade flow.  TTE 8/114/2018  - Left ventricle: The cavity size was normal. Systolic function was normal. The estimated ejection fraction was in the range of 60% to 65%. Wall motion was normal; there were no regional wall motion abnormalities. Doppler parameters are consistent with abnormal left ventricular relaxation (grade 1 diastolic dysfunction).  Carotid Angiography 06/03/2017 FINDING(S):  Type I aortic arch: widely patent  Innominate artery: widely patent ? R common carotid artery: widely patent ? R internal carotid artery: minimal disease, diffuse atherosclerotic plaque suggested ? R external carotid artery: minimal  disease, diffuse atherosclerotic plaque suggested ? R subclavian artery: patent ? R vertebral artery: patent proximally  Left common carotid artery: patent ? L internal carotid artery: patent, 43% stenosis by NASCET proximally ? L external carotid artery: patent  Left subclavian artery: patent ? Left vertebral artery: patent  Intracranial findings to be read by Neuroradiology  CT Head Code Stroke  06/03/2017 IMPRESSION: 1. No acute finding or change from 3 days ago. 2. 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 Chronic small vessel ischemic  changes elsewhere. are stable  CTA Head/Neck 06/03/2017 IMPRESSION: 1. Right M1 embolus with subtotal occlusion but severe stenosis. The anterior temporal branch is covered and non-opacified.  2. Elongated embolus within the left M1 and proximal M2 vessels. 3. Negative for dissection or interval ulceration in the arch and cervical carotids. 4. Carotid bifurcation atherosclerosis with approximately 50-60% left ICA bulb narrowing. Visually the stenosis appears worse due to positive remodeling at the low-density plaque. 5. Moderate atheromatous narrowing of the right V4 and mid basilar.  CT Head WO Contrast 06/03/2017 IMPRESSION: 1. Several small foci of enhancement within bilateral parietal and left frontal cortex as well as left basal ganglia probably representing enhancing infarctions. 2. No large hemorrhage, focal mass effect, herniation, or hydrocephalus.  DG Chest Port 1 View 06/03/2017 IMPRESSION: 1. The ETT terminates 1.5 cm above the carina. Recommend withdrawing 1 cm. 2. New infiltrate in the lateral left lung base is nonspecific.  Recommend attention on follow-up.  DSA 06/03/2017 S/P bilateral common carotid arteriogram,and Rt vert angiogram,followed by complete revascularization of occluded  MCAs M1 segments bilaterally, with x 1 pass with embotrap 53mmx 35 mm  Retrieval device achieving a TICI3 reperfusion bilaterally.  EEG 06/03/2017 This EEG is abnormal and findings are suggestive of mild generalized cerebral dysfunction. Intermittent arm twitching was not associated with any epileptiform activity.  MRI Brain Wo Contrast 06/04/2017 IMPRESSION: 1. 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 No hemorrhagic conversion. 2. Moderate chronic small vessel ischemic disease.    PHYSICAL EXAM  Temp:  [97.7 F (36.5 C)-99.2 F (37.3 C)] 99 F (37.2 C) (08/28 1202) Pulse Rate:  [69-90] 73 (08/28  1202) Resp:  [13-23] 20 (08/28 1202) BP: (112-153)/(52-118) 122/56 (08/28 1121) SpO2:  [95 %-100 %] 98 % (08/28 1202) FiO2 (%):  [28 %] 28 % (08/28 1121) Weight:  [170 lb 10.2 oz (77.4 kg)] 170 lb 10.2 oz (77.4 kg) (08/28 0420)  General - pleasant middle aged Caucasian lady, well developed, on trach collar   Ophthalmologic - Fundi not visualized.  Cardiovascular - Regular rate and rhythm.  Neuro - s/p tracheostomy on trach collar. Awake, alert and following simple peripheral and central commands. Eyes moving both direction, blinking to visual threat bilaterally. PERRL, . no significant facial asymmetry, tongue midline. LUE 5-/5, RUE 4/5 proximal and 3/5 wrist extension, 0/5 hand grip. LLE 4/5 at least, RLE 4-/5. DTR 1+ and no babinski. Sensation symmetrical, coordination not cooperative and gait not tested.   ASSESSMENT/PLAN Ms. [name] is a 66 y.o. female with history of HTN, HLD, pseudomotor cerebri, DM  presenting with headache and weakness of right hand. She did not receive IV t-PA due to out of window. CTA head and neck showed left ICA 65% stenosis. Dr. Bridgett Larsson consulted. During cerebral angiogram this morning, patient had acute mental status decline, became unresponsive. Code stroke called.  Found to have M1 thrombus, status post mechanical thrombectomy.  MRI 06/04/2017 with numerous acute new supra- and infratentorial infarcts spanning multiple vascular territories consistent with embolic phenomena.   In-hospital stroke - bilateral MCA acute thrombosis, likely related to cerebral angiogram procedure treated with bilateral mechanical embolectomy with embotrap device with excellent revascularization  Acute mental status decline during procedure  CT negative for acute hemorrhage or infarct  CTA head and neck showed bilateral MCA acute thrombosis  S/p mechanical thrombectomy with bilateral TICI3 reperfusion  EEG with no seizure activity  MRI showed bilateral supra-and  infratentorial infarcts, consistent with cardioembolic phenomena  Continue DAPT and supportive care   S/p trach   Therapy recommendations: CIR  Disposition:  Pending  Pre-admission stroke: acute and subacute small pathcy infarcts in the left MCA distribution, possibly artery to artery embolic due to left ICA stenosis.     Resultant  Right hand subtle weakness  MRI head Multiple small areas of acute infarct in the left parietal cortex and left occipital cortex. Numerous chronic white matter infarcts bilaterally  CTA head and neck - Large amount of noncalcified plaque at the proximal left internal carotid artery, causing 60-65% stenosis.   2D Echo  EF 60-65%  CUS - left ICA 40-59% stenosis  Cerebral angio < 50% left ICA stenosis  LDL 211  HgbA1c 9.5  SCDs for VTE prophylaxis Diet NPO time specified  aspirin 81 mg daily prior to admission, now on aspirin 325 mg daily and clopidogrel 75 mg daily.    Patient counseled to be compliant with her antithrombotic medications  Ongoing aggressive stroke risk factor management  Left ICA stenosis  CTA neck - left ICA 60-65%  CUS - left ICA 40-59%  DSA: left ICA < 50% stenosis  No intervention needed at this time  Continue follow-up as outpatient  Respiratory failure  Tracheostomy on 06/10/2017 due to upper airway angioedema without cuff leak failed steroids  tolerating trach collar  On IVF for hydration  Mucomyst neb for secretion   Hypertension  Stable  On clonidine patch and metoprolol  Long-term BP goal normotensive  Hyperlipidemia  Home meds:  none  LDL 211, goal < 70  Patient not tolerating statins. self-reports leg numbness, chest pain, severe headaches with statins on multiple occassions with multiple providers  Recommend follow up with PCP Dr. Felipa Eth for consideration of PCSK9 inhibitor. Medication name given to pt.  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 Jaynee Eagles at [facility] day # 15   Neurology will sign off. Please call with questions. Pt will follow up with Dr. Jaynee Eagles at [facility] in about 6 weeks. Thanks for the consult.   Rosalin Hawking, MD PhD Stroke Neurology 06/15/2017 2:52 PM  To contact Stroke Continuity provider, please refer to [email or web address] After hours, contact General Neurology

## 2017-06-15 NOTE — Evaluation (Signed)
Speech Language Pathology Evaluation Patient Details Name: [name] MRN: [id number] DOB: 03-27-1951 Today's Date: 06/15/2017 Time: 2725-3664 SLP Time Calculation (min) (ACUTE ONLY): 23 min  Problem List:  Patient Active Problem List   Diagnosis Date Noted  . Acute embolic stroke ([place])   . Benign essential HTN   . Diabetic peripheral neuropathy ([place])   . Pseudotumor cerebri   . Hypokalemia   . Seizure ([place])   . Leukocytosis   . Acute blood loss anemia   . Post-operative pain   . Encounter for feeding tube placement   . Respiratory failure ([place])   . Status post tracheostomy ([place])   . Acute respiratory failure with hypoxemia ([place])   . Stenosis of left carotid artery   . Acute CVA (cerebrovascular accident) ([place]) 05/31/2017  . Type II diabetes mellitus with complication ([place]) 40/34/7425  . Hypertension 05/31/2017  . Hyponatremia 05/31/2017  . IIH (idiopathic intracranial hypertension) 01/18/2016  . Malfunction of ventriculo-peritoneal shunt ([place]) 01/18/2016  . 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 Worsening headaches 01/18/2016  . Increased abdominal girth 01/18/2016  . Abdominal fluid collection 01/18/2016  . Vision changes 01/18/2016  . Perceived hearing changes 01/18/2016  . Nausea without vomiting 01/18/2016  . Peripheral vision loss 01/18/2016  . Imbalance 01/18/2016  . Falls 01/18/2016  . Dysphagia 01/18/2016  . Subacute confusional state 01/18/2016  . Left breast mass 05/05/2011   Past Medical History:  Past Medical History:  Diagnosis Date  . Anemia    takes iron supplement  . Anesthesia complication    disorientation due to pseudotumor  . Diabetes mellitus    IDDM  . Family history of anesthesia complication 43 yrs ago   brother stopped breathing for a minute or two  . GERD (gastroesophageal reflux disease)   . Headache(784.0)    due to pseudotumor; daily headache  . Hypercholesteremia    unable to tolerate statins  . Hypertension    under control with  meds., has been on med. x 5 yr.  . Peripheral neuropathy   . PONV (postoperative nausea and vomiting)   . 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 Seizures ([place])    due to shunt failure; last seizure 2007  . Synovitis of ankle 04/2013   left  . Wears dentures    full   Past Surgical History:  Past Surgical History:  Procedure Laterality Date  . ANKLE ARTHROSCOPY Left 05/18/2013   Procedure: LEFT ANKLE ARTHROSCOPY WITH DEBRIDEMENT,  SUBTALAR OPEN DEBRIDEMENT ;  Surgeon: Wylene Simmer, MD;  Location: [facility];  Service: Orthopedics;  Laterality: Left;  . APPENDECTOMY     as a child  . BALLOON DILATION N/A 07/04/2013   Procedure: BALLOON DILATION;  Surgeon: Garlan Fair, MD;  Location: [facility];  Service: Endoscopy;  Laterality: N/A;  . BLADDER SUSPENSION    . BREAST LUMPECTOMY W/ NEEDLE LOCALIZATION Left 05/22/2011  . CAROTID ANGIOGRAPHY Bilateral 06/03/2017   Procedure: Bilateral Carotid Angiography;  Surgeon: Conrad Stark, MD;  Location: [facility];  Service: Cardiovascular;  Laterality: Bilateral;  . COLONOSCOPY WITH PROPOFOL N/A 05/14/2014   Procedure: COLONOSCOPY WITH PROPOFOL;  Surgeon: Garlan Fair, MD;  Location: [facility];  Service: Endoscopy;  Laterality: N/A;  . ESOPHAGEAL DILATION  06/23/2006; 08/05/2004  . ESOPHAGOGASTRODUODENOSCOPY N/A 07/04/2013   Procedure: ESOPHAGOGASTRODUODENOSCOPY (EGD);  Surgeon: Garlan Fair, MD;  Location: [facility];  Service: Endoscopy;  Laterality: N/A;  . ESOPHAGOGASTRODUODENOSCOPY (EGD) WITH PROPOFOL N/A 05/14/2014  Procedure: ESOPHAGOGASTRODUODENOSCOPY (EGD) WITH PROPOFOL;  Surgeon: Garlan Fair, MD;  Location: [facility];  Service: Endoscopy;  Laterality: N/A;  . IR ANGIO VERTEBRAL SEL SUBCLAVIAN INNOMINATE UNI R MOD SED  06/03/2017  . IR PERCUTANEOUS ART THROMBECTOMY/INFUSION INTRACRANIAL INC DIAG ANGIO  06/03/2017  . IR PERCUTANEOUS ART THROMBECTOMY/INFUSION INTRACRANIAL INC DIAG ANGIO   06/03/2017  . LUMBAR PERITONEAL SHUNT     x 2  . OVARIAN CYST REMOVAL     age 12  . RADIOLOGY WITH ANESTHESIA N/A 06/03/2017   Procedure: RADIOLOGY WITH ANESTHESIA;  Surgeon: Luanne Bras, MD;  Location: [facility];  Service: Radiology;  Laterality: N/A;  . SHUNT REVISION  2007  . TOTAL ABDOMINAL HYSTERECTOMY W/ BILATERAL SALPINGOOPHORECTOMY  1994   age 65s  . WRIST SURGERY Left 2014    [name]   HPI:  66yo female with hx HTN, DM who presented initially 8/13 to her PCP with headache, R sided weakness and vision changes. She was sent for MRI which revealed multiple areas of acute L MCA infarct. She was sent to ER and admitted for CVA workup. On 8/16 pt was in cath lab for diagnostic carotid and cerebral angiogram with vascular surgery when she became suddenly unresponsive with L sided weakness and possible L sided seizure-like activity. CTA revealed bilateral M1 occlusions. She was intubated and taken urgently to IR for revascularization and rapid resolution of embolism. ETT 8/16-8/20, 8/20-8/23, trach 8/23, pt tolerated trach collar overnight.   Assessment / Plan / Recommendation Clinical Impression  Pt is oriented x4, follows simple one-step commands, and has accurate answers to simple yes/no questions with Min cues. However, she cannot complete two-step commands and needs Max-Total A to use communication boards to facilitate communication while she is not able to wear her PMV. Pt was only able to point to "B" on an alphabet board when asked to spell her name. Her simple problem solving is impaired, and as a result she also becomes easily frustrated with her communciation difficulties. She will benefit from additional SLP f/u to maximize functional communication and cognition.    SLP Assessment  SLP Recommendation/Assessment: Patient needs continued Speech Lanaguage Pathology Services SLP Visit Diagnosis: Cognitive communication deficit (R41.841)    Follow Up Recommendations  Inpatient  Rehab    Frequency and Duration min 3x week  2 weeks      SLP Evaluation Cognition  Overall Cognitive Status: Impaired/Different from baseline (impacted by current communication abilities) Arousal/Alertness: Awake/alert Orientation Level: Oriented X4 Awareness: Impaired Awareness Impairment: Intellectual impairment Problem Solving: Impaired Problem Solving Impairment: Functional basic;Verbal basic Behaviors: Poor frustration tolerance       Comprehension  Auditory Comprehension Overall Auditory Comprehension: Impaired Yes/No Questions: Impaired Complex Questions: 75-100% accurate Commands: Impaired One Step Basic Commands: 75-100% accurate Two Step Basic Commands: 25-49% accurate Conversation: Simple    Expression Expression Primary Mode of Expression: Verbal Verbal Expression Overall Verbal Expression: Appears within functional limits for tasks assessed (although limited by aphonia due to trach) Non-Verbal Means of Communication: Communication board;Gestures (attempted board - unable to use)   Oral / Motor  Motor Speech Overall Motor Speech: Other (comment) (limited assessment due to aphonia from trach) Intelligibility: Intelligibility reduced Phrase: 50-74% accurate   GO                    [name] 06/15/2017, 2:30 PM  [name], M.A. CCC-SLP [phone]

## 2017-06-15 NOTE — Progress Notes (Signed)
  Speech Language Pathology Treatment: Dysphagia;Passy Muir Speaking valve  Patient Details Name: [name] MRN: [id number] DOB: 1951-10-13 Today's Date: 06/15/2017 Time: 1040-1050 SLP Time Calculation (min) (ACUTE ONLY): 10 min  Assessment / Plan / Recommendation Clinical Impression  Pt still has evidence of reduced upper airway patency, including inability to wear PMV for more than 1-2 minutes at a time before she begins to have air trapping. Only small amounts of airflow is noted through the upper airway. Per MD note, plan is for her trach to be changed to #4 cuffless tomorrow morning, which I believe will help improve her ability to tolerate the PMV. SLP did place the valve briefly to try ice chips again, with delayed coughing observed. I hope to better assess her swallowing once she is able to tolerate the PMV for longer periods.    HPI HPI: 66yo female with hx HTN, DM who presented initially 8/13 to her PCP with headache, R sided weakness and vision changes. She was sent for MRI which revealed multiple areas of acute L MCA infarct. She was sent to ER and admitted for CVA workup. On 8/16 pt was in cath lab for diagnostic carotid and cerebral angiogram with vascular surgery when she became suddenly unresponsive with L sided weakness and possible L sided seizure-like activity. CTA revealed bilateral M1 occlusions. She was intubated and taken urgently to IR for revascularization and rapid resolution of embolism. ETT 8/16-8/20, 8/20-8/23, trach 8/23, pt tolerated trach collar overnight.      SLP Plan  Continue with current plan of care       Recommendations  Diet recommendations: NPO Medication Administration: Via alternative means      Patient may use Passy-Muir Speech Valve: with SLP only PMSV Supervision: Full MD: Please consider changing trach tube to : Smaller size;Cuffless         Oral Care Recommendations: Oral care QID Follow up Recommendations: Inpatient  Rehab SLP Visit Diagnosis: Dysphagia, unspecified (R13.10);Aphonia (R49.1) Plan: Continue with current plan of care       GO                [name] 06/15/2017, 2:18 PM  [name], M.A. CCC-SLP [phone]

## 2017-06-15 NOTE — Progress Notes (Signed)
Physical Therapy Treatment Patient Details Name: [name] MRN: [id number] DOB: April 20, 1951 Today's Date: 06/15/2017    History of Present Illness Pt is a 66 y/o female with hx HTN, DM. She presented initially 8/13 to her PCP with headache, R hight weakness and vision changes. She was sent for MRI which revealed multiple areas of acute L MCA infarct. She was sent to ER and admitted for CVA workup. On 8/16 pt was in cath lab for diagnostic carotid and cerebral angiogram with vascular surgery when she became suddenly unresponsive with L sided weakness and possible L sided seizure-like activity. CTA revealed bilateral M1 occlusions. She was intubated and taken urgently to IR for revascularization and rapid resolution of embolism. Pt with ETT 8/16-8/20, 8/20-8/23, trach 8/23. Pt now on trach collar.    PT Comments    Pt refused therapy earlier today but with encouragement of daughter agreed to participate this afternoon. Pt continues to make slow progress. Trunk control and balance seem to be affecting mobility more than LE strength. Will continue to focus on sitting balance and transfers.   Follow Up Recommendations  CIR;Supervision/Assistance - 24 hour     Equipment Recommendations  Other (comment) (to be assessed)    Recommendations for Other Services       Precautions / Restrictions Precautions Precautions: Fall Precaution Comments: cortrak, trach Restrictions Weight Bearing Restrictions: No    Mobility  Bed Mobility Overal bed mobility: Needs Assistance Bed Mobility: Supine to Sit;Sit to Supine     Supine to sit: Max assist;+2 for physical assistance Sit to supine: +2 for physical assistance;Max assist   General bed mobility comments: Assist to elevate trunk and to bring hips to EOB. Assist to lower trunk and bring legs back into bed.  Transfers                 General transfer comment: Did not attempt due to poor sitting balance and  fatigue.  Ambulation/Gait                 Stairs            Wheelchair Mobility    Modified Rankin (Stroke Patients Only) Modified Rankin (Stroke Patients Only) Pre-Morbid Rankin Score: No symptoms Modified Rankin: Severe disability     Balance Overall balance assessment: Needs assistance Sitting-balance support: Feet supported;Bilateral upper extremity supported Sitting balance-Leahy Scale: Zero Sitting balance - Comments: Pt sat EOB x 15 minutes with mod to max assist. Sat in chair in front of pt to facilitate midline Postural control: Posterior lean;Right lateral lean                                  Cognition Arousal/Alertness: Awake/alert Behavior During Therapy: Restless Overall Cognitive Status: Difficult to assess                                 General Comments: Follows 1 step commands      Exercises Other Exercises Other Exercises: Pt performed bridging in supine x 3    General Comments        Pertinent Vitals/Pain Pain Assessment: Faces Faces Pain Scale: No hurt    Home Living       Type of Home: House              Prior Function  PT Goals (current goals can now be found in the care plan section) Acute Rehab PT Goals Patient Stated Goal: none stated PT Goal Formulation: With patient Time For Goal Achievement: 06/29/17 Potential to Achieve Goals: Fair Progress towards PT goals: Goals downgraded-see care plan    Frequency    Min 3X/week      PT Plan Current plan remains appropriate    Co-evaluation              AM-PAC PT "6 Clicks" Daily Activity  Outcome Measure  Difficulty turning over in bed (including adjusting bedclothes, sheets and blankets)?: Unable Difficulty moving from lying on back to sitting on the side of the bed? : Unable Difficulty sitting down on and standing up from a chair with arms (e.g., wheelchair, bedside commode, etc,.)?: Unable Help needed  moving to and from a bed to chair (including a wheelchair)?: Total Help needed walking in hospital room?: Total Help needed climbing 3-5 steps with a railing? : Total 6 Click Score: 6    End of Session Equipment Utilized During Treatment: Oxygen Activity Tolerance: Patient limited by fatigue Patient left: in bed;with call bell/phone within reach;with bed alarm set;with family/visitor present   PT Visit Diagnosis: Other abnormalities of gait and mobility (R26.89);Hemiplegia and hemiparesis Hemiplegia - Right/Left: Right Hemiplegia - dominant/non-dominant: Dominant Hemiplegia - caused by: Cerebral infarction     Time: 1829-9371 PT Time Calculation (min) (ACUTE ONLY): 31 min  Charges:  $Therapeutic Activity: 23-37 mins                    G CodesMarland Kitchen       [facility] PT [place] 06/15/2017, 3:37 PM

## 2017-06-15 NOTE — Progress Notes (Signed)
Pt still refuse lab draws, MD Sherral Hammers at bedside.

## 2017-06-15 NOTE — Progress Notes (Addendum)
PROGRESS NOTE    [name]  OEV:[id number] DOB: [birth year] DOA: 05/31/2017 PCP: [name], MD   Brief Narrative:  66 y.o. WF PMHx Pseudotumor Cerebri , Seizure, HTN, HLD, Diabetes type 2 uncontrolled with Neuropathy,  Apparently c/o fall last Tuesday and on Wednesday had some headache and then on Thursday had  weakness of the right hand, and numbness of the right hand pt has difficulty seeing out of the right eye.  Pt was seen in the office on Friday and had MRI brain 8/13=> Multiple small areas of acute infarct in the left parietal cortex and left occipital cortex. Recommend CTA head and neck to evaluate for source of emboli or stenosis causing these infarcts  Numerous chronic white matter infarcts bilaterally.8/16 pt was in cath lab for diagnostic carotid and cerebral angiogram with vascular surgery when she became suddenly unresponsive with L sided weakness and possible L sided seizure-like activity. CTA revealed bilateral M1 occlusions. She was intubated and taken urgently to IR for revascularization and rapid resolution of embolism.      Subjective:  8/28 alert nods that she does not want any therapeutic/diagnostic tests run. Attempted to have patient write down wishes and she threw paper and pen away. Extremely frustrated this point. Indicated she wanted to go home. Negative CP, negative SOB, negative abdominal pain negative N/V   ADDENDUM: Received call from RN Patient C/O of hot flashes secondary to not receiving her HRT   Assessment & Plan:   Principal Problem:   Acute CVA (cerebrovascular accident) ([place]) Active Problems:   Type II diabetes mellitus with complication ([place])   Hypertension   Hyponatremia   Stenosis of left carotid artery   Acute respiratory failure with hypoxemia (HCC)   Status post tracheostomy ([place])   Acute embolic stroke (HCC)   Benign essential HTN   Diabetic peripheral neuropathy (HCC)   Pseudotumor cerebri   Hypokalemia   Seizure  (HCC)   Leukocytosis   Acute blood loss anemia   Post-operative pain   Encounter for feeding tube placement   Respiratory failure ([place])  Acute embolic stroke -Bilateral MCA occlusion, LICA stenosis, S/P revascularization by IR -8/28 failed swallow evaluation, continue NPO -Stroke team on board -Aspirin + Plavix -CIR: Evaluated patient on 8/28 initiating insurance authorization.  Altered mental status -Multifactorial acute ischemic stroke, MSSA bronchitis/pneumonia -Resolved  Acute respiratory failure with hypoxia/Vocal Cord Edema and stridor  -Patient S/P tracheostomy 8/23 -Currently on trach collar tolerating well -Speech continue to work with patient increase PMV use -Weaning protocol daily: Cannulation prior to discharge?  positive MSSA Pneumonia bilateral lower lobes -Complete 7 days antibiotics -DuoNeb QID -Frequent suctioning   Essential HTN  -SBP goal 120-140 -Clonidine patch 0.3 mg -Metoprolol 100 mg BID -Labetalol PRN  Diabetes type 2 uncontrolled with complications  -8/29 Hemoglobin A1c = 9.5 -Lantus 12 units daily -NovoLog 4 units q 4 hr -Moderate SSI  Acute Urinary retention -Place Foley -Strict in and out -Daily weight  Diabetic Gastroparesis -Resolved    Dysphagia -8/27 failed swallow evaluation   Protein calorie malnutrition -Vital 1.5 goal rate 25 ml/hr . Goal tolerating well   Hormone replacement therapy -Restart Ogen 0.75 mg daily    Goals care -8/28 PALLIATIVE CARE: Patient has emphatically stated does not want further diagnostic/therapeutic modalities. Discussed with patient and family change of code status, short-term  Vs long-term goals of care, hospice      DVT prophylaxis: Lovenox Code Status: Full Family Communication: Spoke with with husband and he was  unable to convince patient to allow diagnostic/therapeutic modalities. Has contacted his daughter who is going to come up and try to commence patient in her best interest to  allow [place] to treat further.  Disposition Plan: CIR?   Consultants:  [facility] M Neurology Stroke team Vascular surgery     Procedures/Significant Events:  8/12 MR brain:  -Multiple small areas of acute infarct in the left parietal cortex and left occipital cortex. - Numerous chronic white matter infarcts bilaterally 8/16 CTA head:-Right . Right M1 embolus with subtotal occlusion but severe stenosis.  -Elongated embolus left M1 and proximal M2 vessels. -Carotid bifurcation atherosclerosis with approximately 50-60% left ICA bulb narrowing. Visually the stenosis appears worse due to positive remodeling at the low-density plaque. 8/16 CT head-Several small foci of enhancement within bilateral parietal and left frontal cortex as well as left basal ganglia probably representing enhancing infarctions. 8/14Echocardiogram-LVEF= 60% to 65%. -Grade 1 diastolic dysfunction 9/14 - sudden change in mental status, to IR for bilateral embolectomy for revascularization of bilat MCA occlusions  8/17 MRI Brain:-Numerous acute new supra- and infratentorial infarcts spanning multiple vascular territories consistent with embolic phenomena. Nohemorrhagic conversion.   I have personally reviewed and interpreted all radiology studies and my findings are as above.  VENTILATOR SETTINGS: Trach collar FiO2 20%   Cultures 8/17 MRSA by PCR negative 8/20 Tracheal Aspirate positive MSSA      Antimicrobials: Anti-infectives    Start     Stop   06/09/17 1200  ceFAZolin (ANCEF) IVPB 2g/100 mL premix         06/03/17 1126  vancomycin (VANCOCIN) 1-5 GM/200ML-% IVPB    Comments:  Duininck, Stacey   : cabinet override   06/03/17 7829       Devices    LINES / TUBES:  ETT 8/16>>>8/20>>>8/23 R rad aline 8/16>>>8/17 Tracheostomy 8/23>>>     Continuous Infusions: . sodium chloride 50 mL/hr at 06/14/17 2116  .  ceFAZolin (ANCEF) IV Stopped (06/15/17 0450)  . famotidine (PEPCID) IV Stopped (06/14/17  1829)     Objective: Vitals:   06/15/17 0417 06/15/17 0420 06/15/17 0723 06/15/17 0804  BP: (!) 153/62  (!) 112/52 (!) 130/118  Pulse: 87  75 90  Resp: _0 Temp: 97.7 F (36.5 C)   98.2 F (36.8 C)  TempSrc: Oral   Oral  SpO2: 100%  100% 98%  Weight:  170 lb 10.2 oz (77.4 kg)    Height:        Intake/Output Summary (Last 24 hours) at 06/15/17 0813 Last data filed at 06/15/17 0600  Gross per 24 hour  Intake             1740 ml  Output             1000 ml  Net              740 ml   Filed Weights   06/13/17 0422 06/14/17 0325 06/15/17 0420  Weight: 166 lb 3.6 oz (75.4 kg) 166 lb 7.2 oz (75.5 kg) 170 lb 10.2 oz (77.4 kg)    Examination: Physical Exam:  General: Alert, refuses all therapeutic modalities, positive acute respiratory distress Neck:  Negative scars, masses, torticollis, lymphadenopathy, JVD, tracheostomy in place negative sign of infection Lungs: mild diffuse rhonchi (improved), without wheezes or crackles Cardiovascular: Regular rate and rhythm without murmur gallop or rub normal S1 and S2 Abdomen: negative abdominal pain, nondistended, positive soft, bowel sounds, no rebound, no ascites, no appreciable mass Extremities:  No significant cyanosis, clubbing, or edema bilateral lower extremities Skin: Negative rashes, lesions, ulcers Psychiatric:  Positive depression, positive anxiety, negative fatigue, negative mania, unable to fully evaluate as patient refuses to interact  Central nervous system:  Awake allow this physical exam unable to fully evaluate as patient refuses to cooperate        Data Reviewed: Care during the described time interval was provided by me .  I have reviewed this patient's available data, including medical history, events of note, physical examination, and all test results as part of my evaluation.   CBC:  Recent Labs Lab 06/10/17 0630 06/13/17 0250 06/14/17 0309  WBC 15.3* 12.5* 11.0*  HGB 8.9* 9.6* 8.1*  HCT 27.2*  30.5* 26.5*  MCV 88.3 89.4 89.5  PLT 362 480* 128*   Basic Metabolic Panel:  Recent Labs Lab 06/09/17 2346 06/10/17 0630 06/12/17 0530 06/13/17 0250 06/14/17 0309  NA 142 140 140 144 141  K 3.2* 3.7 2.9* 4.1 3.0*  CL 108 107 97* 105 105  CO2 _0 GLUCOSE 177* 152* 209* 67 78  BUN 31* 29* 36* 36* 40*  CREATININE 0.85 0.86 0.83 0.82 0.88  CALCIUM 8.4* 8.2* 8.6* 9.1 8.5*  MG 1.8 1.8  --  2.2  --   PHOS  --   --   --  2.3*  --    GFR: Estimated Creatinine Clearance: 64.2 mL/min (by C-G formula based on SCr of 0.88 mg/dL). Liver Function Tests: No results for input(s): AST, ALT, ALKPHOS, BILITOT, PROT, ALBUMIN in the last 168 hours. No results for input(s): LIPASE, AMYLASE in the last 168 hours. No results for input(s): AMMONIA in the last 168 hours. Coagulation Profile:  Recent Labs Lab 06/10/17 0630 06/10/17 0808  INR QUESTIONABLE RESULTS, RECOMMEND RECOLLECT TO VERIFY 0.99   Cardiac Enzymes: No results for input(s): CKTOTAL, CKMB, CKMBINDEX, TROPONINI in the last 168 hours. BNP (last 3 results) No results for input(s): PROBNP in the last 8760 hours. HbA1C: No results for input(s): HGBA1C in the last 72 hours. CBG:  Recent Labs Lab 06/14/17 1653 06/14/17 2011 06/14/17 2344 06/15/17 0410 06/15/17 0803  GLUCAP 119* 267* 155* 73 218*   Lipid Profile: No results for input(s): CHOL, HDL, LDLCALC, TRIG, CHOLHDL, LDLDIRECT in the last 72 hours. Thyroid Function Tests: No results for input(s): TSH, T4TOTAL, FREET4, T3FREE, THYROIDAB in the last 72 hours. Anemia Panel: No results for input(s): VITAMINB12, FOLATE, FERRITIN, TIBC, IRON, RETICCTPCT in the last 72 hours. Urine analysis: No results found for: COLORURINE, APPEARANCEUR, LABSPEC, PHURINE, GLUCOSEU, HGBUR, BILIRUBINUR, KETONESUR, PROTEINUR, UROBILINOGEN, NITRITE, LEUKOCYTESUR Sepsis Labs: _1 (procalcitonin:4,lacticidven:4)  ) Recent Results (from the past 240 hour(s))  Culture,  respiratory (NON-Expectorated)     Status: None   Collection Time: 06/07/17 11:58 AM  Result Value Ref Range Status   Specimen Description TRACHEAL ASPIRATE  Final   Special Requests Normal  Final   Gram Stain   Final    RARE WBC PRESENT, PREDOMINANTLY PMN ABUNDANT GRAM POSITIVE COCCI    Culture   Final    ABUNDANT STAPHYLOCOCCUS AUREUS MODERATE STREPTOCOCCUS,BETA HEMOLYTIC NOT GROUP A    Report Status 06/09/2017 FINAL  Final   Organism ID, Bacteria STAPHYLOCOCCUS AUREUS  Final      Susceptibility   Staphylococcus aureus - MIC*    CIPROFLOXACIN <=0.5 SENSITIVE Sensitive     ERYTHROMYCIN 0.5 SENSITIVE Sensitive     GENTAMICIN <=0.5 SENSITIVE Sensitive     OXACILLIN 0.5 SENSITIVE Sensitive  TETRACYCLINE <=1 SENSITIVE Sensitive     VANCOMYCIN <=0.5 SENSITIVE Sensitive     TRIMETH/SULFA <=10 SENSITIVE Sensitive     CLINDAMYCIN <=0.25 SENSITIVE Sensitive     RIFAMPIN <=0.5 SENSITIVE Sensitive     Inducible Clindamycin NEGATIVE Sensitive     * ABUNDANT STAPHYLOCOCCUS AUREUS         Radiology Studies: Dg Chest Port 1 View  Result Date: 06/14/2017 CLINICAL DATA:  Tracheostomy. EXAM: PORTABLE CHEST 1 VIEW COMPARISON:  Radiograph of June 10, 2017. FINDINGS: Stable cardiomediastinal silhouette. Tracheostomy tube and feeding tube are unchanged in position. No pneumothorax is noted. Right lung is clear. Minimal left basilar subsegmental atelectasis with associated pleural effusion is noted. Bony thorax is unremarkable. IMPRESSION: Minimal left basilar subsegmental atelectasis with associated minimal pleural effusion. Electronically Signed   By: Marijo Conception, M.D.   On: 06/14/2017 07:27   Dg Abd Portable 1v  Result Date: 06/14/2017 CLINICAL DATA:  Abdominal pain and constipation. EXAM: PORTABLE ABDOMEN - 1 VIEW COMPARISON:  Lower chest and upper abdominal radiograph of June 04, 2017 FINDINGS: The feeding tube tip projects in the distal duodenum or proximal jejunum. There is no  small or large bowel obstructive pattern. The stool and gas pattern within the colon and rectum appears normal. There is a radiodense structure that projects in the region of the anal verge that is of uncertain significance. The bony structures exhibit no acute abnormalities. IMPRESSION: No evidence of bowel obstruction or ileus. The colonic stool burden does not appear excessive. The feeding tube tip projects in the distal duodenum or proximal jejunum. Electronically Signed   By: David  Martinique M.D.   On: 06/14/2017 09:55        Scheduled Meds: . acetylcysteine  2 mL Nebulization Q4H  . aspirin  325 mg Per Tube Daily  . chlorhexidine gluconate (MEDLINE KIT)  15 mL Mouth Rinse BID  . ciprofloxacin-dexamethasone  4 drop Right EAR BID  . cloNIDine  0.3 mg Transdermal Weekly  . clopidogrel  75 mg Per Tube Q breakfast  . docusate  100 mg Oral Daily  . enoxaparin (LOVENOX) injection  40 mg Subcutaneous Q24H  . feeding supplement (PRO-STAT SUGAR FREE 64)  30 mL Per Tube QID  . feeding supplement (VITAL 1.5 CAL)  1,000 mL Per Tube Q24H  . insulin aspart  0-15 Units Subcutaneous Q4H  . insulin aspart  4 Units Subcutaneous Q4H  . insulin glargine  12 Units Subcutaneous Daily  . Investigational - Study Medication  20 mg Per Tube TID  . ipratropium-albuterol  3 mL Nebulization Q6H  . mouth rinse  15 mL Mouth Rinse QID  . metoprolol tartrate  100 mg Per Tube BID   Continuous Infusions: . sodium chloride 50 mL/hr at 06/14/17 2116  .  ceFAZolin (ANCEF) IV Stopped (06/15/17 0450)  . famotidine (PEPCID) IV Stopped (06/14/17 1829)     LOS: 15 days    Time spent: 40 minutes    Athanasia Stanwood, Geraldo Docker, MD [facility] Pager [phone]   If 7PM-7AM, please contact night-coverage [email or web address] Password [facility] 06/15/2017, 8:13 AM

## 2017-06-15 NOTE — Progress Notes (Signed)
MD Sherral Hammers made aware that patient has refused lab draws x3 this AM.

## 2017-06-15 NOTE — Progress Notes (Signed)
Inpatient Rehabilitation  Note improvements in secretions with plan for trach downsize tomorrow.  Plan to initiate insurance authorization.  Will continue to follow along for timing of medical readiness, insurance authorization, and IP Rehab bed availability.  Please call with questions.   Carmelia Roller., CCC/SLP Admission Coordinator  [place]  Cell [phone]

## 2017-06-16 DIAGNOSIS — Z515 Encounter for palliative care: Secondary | ICD-10-CM

## 2017-06-16 DIAGNOSIS — Z7189 Other specified counseling: Secondary | ICD-10-CM

## 2017-06-16 DIAGNOSIS — I5032 Chronic diastolic (congestive) heart failure: Secondary | ICD-10-CM

## 2017-06-16 DIAGNOSIS — Z01818 Encounter for other preprocedural examination: Secondary | ICD-10-CM

## 2017-06-16 DIAGNOSIS — F411 Generalized anxiety disorder: Secondary | ICD-10-CM

## 2017-06-16 LAB — BASIC METABOLIC PANEL
ANION GAP: 6 (ref 5–15)
BUN: 22 mg/dL — ABNORMAL HIGH (ref 6–20)
CO2: 25 mmol/L (ref 22–32)
Calcium: 8.4 mg/dL — ABNORMAL LOW (ref 8.9–10.3)
Chloride: 107 mmol/L (ref 101–111)
Creatinine, Ser: 0.79 mg/dL (ref 0.44–1.00)
GLUCOSE: 270 mg/dL — AB (ref 65–99)
POTASSIUM: 4.1 mmol/L (ref 3.5–5.1)
Sodium: 138 mmol/L (ref 135–145)

## 2017-06-16 LAB — CBC
HCT: 24.8 % — ABNORMAL LOW (ref 36.0–46.0)
Hemoglobin: 7.6 g/dL — ABNORMAL LOW (ref 12.0–15.0)
MCH: 27.5 pg (ref 26.0–34.0)
MCHC: 30.6 g/dL (ref 30.0–36.0)
MCV: 89.9 fL (ref 78.0–100.0)
PLATELETS: 412 10*3/uL — AB (ref 150–400)
RBC: 2.76 MIL/uL — AB (ref 3.87–5.11)
RDW: 14.6 % (ref 11.5–15.5)
WBC: 10.4 10*3/uL (ref 4.0–10.5)

## 2017-06-16 LAB — GLUCOSE, CAPILLARY
GLUCOSE-CAPILLARY: 145 mg/dL — AB (ref 65–99)
GLUCOSE-CAPILLARY: 161 mg/dL — AB (ref 65–99)
GLUCOSE-CAPILLARY: 192 mg/dL — AB (ref 65–99)
GLUCOSE-CAPILLARY: 209 mg/dL — AB (ref 65–99)
GLUCOSE-CAPILLARY: 250 mg/dL — AB (ref 65–99)
GLUCOSE-CAPILLARY: 301 mg/dL — AB (ref 65–99)
Glucose-Capillary: 176 mg/dL — ABNORMAL HIGH (ref 65–99)
Glucose-Capillary: 205 mg/dL — ABNORMAL HIGH (ref 65–99)

## 2017-06-16 LAB — PREPARE RBC (CROSSMATCH)

## 2017-06-16 LAB — ABO/RH: ABO/RH(D): A POS

## 2017-06-16 MED ORDER — INSULIN GLARGINE 100 UNIT/ML ~~LOC~~ SOLN
17.0000 [IU] | Freq: Every day | SUBCUTANEOUS | Status: DC
  Administered 2017-06-17 – 2017-06-18 (×2): 17 [IU] via SUBCUTANEOUS
  Filled 2017-06-16 (×2): qty 0.17

## 2017-06-16 MED ORDER — ESTROPIPATE 0.75 MG PO TABS
0.7500 mg | ORAL_TABLET | Freq: Every day | ORAL | Status: DC
  Administered 2017-06-16 – 2017-06-17 (×2): 0.75 mg via ORAL
  Filled 2017-06-16 (×3): qty 1

## 2017-06-16 MED ORDER — LORAZEPAM 2 MG/ML IJ SOLN
1.0000 mg | Freq: Three times a day (TID) | INTRAMUSCULAR | Status: DC
  Administered 2017-06-16 – 2017-06-18 (×5): 1 mg via INTRAVENOUS
  Filled 2017-06-16 (×6): qty 1

## 2017-06-16 MED ORDER — SODIUM CHLORIDE 0.9 % IV SOLN
Freq: Once | INTRAVENOUS | Status: AC
  Administered 2017-06-16: 10:00:00 via INTRAVENOUS

## 2017-06-16 MED ORDER — LORAZEPAM 2 MG/ML IJ SOLN
INTRAMUSCULAR | Status: AC
  Filled 2017-06-16: qty 1

## 2017-06-16 NOTE — Consult Note (Signed)
Consultation Note Date: 06/16/2017   Patient Name: [name]  DOB: Jul 24, 1951  MRN: [id number]  Age / Sex: 66 y.o., female  PCP: [name], MD Referring Physician: Allie Bossier, MD  Reason for Consultation: Establishing goals of care and Psychosocial/spiritual support  HPI/Patient Profile: 66 y.o. female    (Per EMR)   admitted on 05/31/2017 with  history of DM, HTN, peripheral neuropathy, pseudotumor cerebri, seizure disorder, one week history of right hand weakness and HA who had MRI brain done 8/12 revealing acute infarcts in left parietal cortex, white matter and occipital lobe.   She was sent to the ED for full work up.  Carotid dopplers done revealing L-40-59% ICA stenosis. Cardiac echo revealed  EF 60-65% with no wall abnormality and grade 1 diastolic dysfunction.  CTA head/neck done revealing large non-calcified plaque in proximal L-ICA causing 60-65% stenosis and likely source of emboli.   She underwent cerebral angiogram for work up on 8/16 revealing < 50% L-ICA stenosis and during the procedure became unresponsive with bilateral UE tonic posturing and left sided weakness.   She  was taken to IR for endovascualr revascularization of B-MCA occlusions with TICI reperfusion bilaterally. Dr. Erlinda Hong recommended DAPT x 3 months followed by Plavix alone.   EEG revealed mild generalized dysfunction and intermittent BUE twitching not associated with epileptiform activity. Follow up MRI brain reviewed, showing multiple infarcts.  Per report, numerous acute new supra and infratentorial infarcts involving B-MCA, and bilateral PCA- no hemorrhage.  She was briefly extubated on 8/20 but developed stridor with increase WOB requiring reintubation and beside tracheostomy on 8/23 by Dr. Nelda Marseille.     Palliative medicine was consulted to help establish goals with patient and family.  Over the last several days  there's been some concern that patient is expressing a desire to stop all treatments.    Patient and family are faced with treatment option decisions, advanced directive decisions, and anticipatory care needs.  CIR recommended due to decline in mobility and ability to carry out ADL tasks.   Clinical Assessment and Goals of Care:   This NP Wadie Lessen reviewed medical records, received report from team, assessed the patient and then meet at the patient's bedside along with her husband and daughter  to discuss diagnosis, prognosis, GOC, EOL wishes disposition and options.  Long discussion was had today regarding goals of care.   The difference between a aggressive medical intervention path  and a palliative comfort care path for this patient at this time was had.  Values and goals of care important to patient and family were attempted to be elicited.  Patient is indeed frustrated with her current medical situation, however she wishes to proceed with all offered and available medical interventions, to treat the treatable and hopefully return to her baseline.  At times she indeed does refuse to participate care or have her labs drawn;  that is in response to the bigger picture.  She is tired and frustrated and scared, and unable to communicate effectively. She  simply asks providers include her in the care plan. There are times that she simply just cannot tolerate any more intervention.  She and her family asked for patience and empathy to her situation.  She asks for full explanation of any procedure or nursing intervention that is  going to take place.  Discussed with nursing and critical care plan for a trach change in hopes of utilizing Passy-Muir valve more effectively.  She is hopeful for inpatient rehabilitation.  A  discussion was had today regarding advanced directives.  Concepts specific to code status, artifical feeding and hydration.  Shared my concern with patient and family that  the next big decision they face is regarding artificial feeding.  Patient is open to PEG placement temporarily.   Questions and concerns addressed.   Family encouraged to call with questions or concerns.  PMT will continue to support holistically.   HCPOA/husband    SUMMARY OF RECOMMENDATIONS    Code Status/Advance Care Planning:  Full code   Additional Recommendations (Limitations, Scope, Preferences):  Full Scope Treatment  Psycho-social/Spiritual:   Desire for further Chaplaincy support:no-declined  Additional Recommendations: Emotional support offered to patient and her family  Prognosis:   Unable to determine  Discharge Planning: To Be Determined      Primary Diagnoses: Present on Admission: . Acute CVA (cerebrovascular accident) ([place])   I have reviewed the medical record, interviewed the patient and family, and examined the patient. The following aspects are pertinent.  Past Medical History:  Diagnosis Date  . Anemia    takes iron supplement  . Anesthesia complication    disorientation due to pseudotumor  . Diabetes mellitus    IDDM  . Family history of anesthesia complication 93 yrs ago   brother stopped breathing for a minute or two  . GERD (gastroesophageal reflux disease)   . Headache(784.0)    due to pseudotumor; daily headache  . Hypercholesteremia    unable to tolerate statins  . Hypertension    under control with meds., has been on med. x 5 yr.  . Peripheral neuropathy   . PONV (postoperative nausea and vomiting)   . 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 Seizures ([place])    due to shunt failure; last seizure 2007  . Synovitis of ankle 04/2013   left  . Wears dentures    full   Social History   Social History  . Marital status: Married    Spouse name: Simona Huh  . Number of children: 2  . Years of education: 91   Social History Main Topics  . Smoking status: Never Smoker  . Smokeless tobacco: Never Used  . Alcohol  use No  . Drug use: No  . Sexual activity: Not Asked   Other Topics Concern  . None   Social History Narrative   Lives at home with husband and daughter   Caffeine use: coke daily      Family History  Problem Relation Age of Onset  . Heart disease Father   . Migraines Neg Hx    Scheduled Meds: . acetylcysteine  2 mL Nebulization Q6H WA  . aspirin  325 mg Per Tube Daily  . chlorhexidine gluconate (MEDLINE KIT)  15 mL Mouth Rinse BID  . ciprofloxacin-dexamethasone  4 drop Right EAR BID  . cloNIDine  0.3 mg Transdermal Weekly  . clopidogrel  75 mg Per Tube Q breakfast  . docusate  100 mg Oral Daily  . Investigational - Study Medication  20 mg Per Tube TID  . enoxaparin (LOVENOX) injection  40 mg Subcutaneous Q24H  . estropipate  0.75 mg Oral Daily  . feeding supplement (PRO-STAT SUGAR FREE 64)  30 mL Per Tube Daily  . insulin aspart  0-15 Units Subcutaneous Q4H  . insulin aspart  4 Units Subcutaneous Q4H  . insulin glargine  12 Units Subcutaneous Daily  . ipratropium-albuterol  3 mL Nebulization Q6H  . mouth rinse  15 mL Mouth Rinse QID  . metoprolol tartrate  100 mg Per Tube BID   Continuous Infusions: . sodium chloride 50 mL/hr at 06/15/17 2129  .  ceFAZolin (ANCEF) IV Stopped (06/16/17 0533)  . famotidine (PEPCID) IV Stopped (06/15/17 1402)  . feeding supplement (VITAL 1.5 CAL) 1,000 mL (06/16/17 0248)   PRN Meds:.acetaminophen **OR** acetaminophen (TYLENOL) oral liquid 160 mg/5 mL **OR** acetaminophen, albuterol, diclofenac sodium, diphenhydrAMINE, fentaNYL (SUBLIMAZE) injection, ketorolac, labetalol, LORazepam, morphine injection, ondansetron (ZOFRAN) IV, pantoprazole, phenol Medications Prior to Admission:  Prior to Admission medications   Medication Sig Start Date End Date Taking? Authorizing Provider  aspirin EC 81 MG tablet Take 81 mg by mouth daily.   Yes [provider]  cloNIDine (CATAPRES) 0.3 MG tablet Take 0.3 mg by mouth 2 (two) times daily.    Yes  [provider]  diclofenac sodium (VOLTAREN) 1 % GEL Apply 2 application topically 4 (four) times daily as needed (pain).    Yes [provider]  esomeprazole (NEXIUM) 40 MG capsule Take 40 mg by mouth daily as needed (acid reflux). Acid reflux   Yes [provider]  estropipate (OGEN) 1.5 MG tablet Take 0.75 mg by mouth daily.    Yes [provider]  furosemide (LASIX) 20 MG tablet Take 20 mg by mouth daily.   Yes [provider]  gabapentin (NEURONTIN) 100 MG capsule Take 100 mg by mouth at bedtime.   Yes [provider]  glipiZIDE (GLUCOTROL) 10 MG tablet Take 10 mg by mouth 2 (two) times daily before a meal.    Yes [provider]  insulin glargine (LANTUS) 100 unit/mL SOPN Inject 32 Units into the skin at bedtime.   Yes [provider]  Melatonin 10 MG TABS Take 10 mg by mouth at bedtime as needed (sleep).   Yes [provider]  metFORMIN (GLUCOPHAGE-XR) 500 MG 24 hr tablet Take 1,000 mg by mouth 3 (three) times daily with meals.   Yes [provider]  metoprolol (LOPRESSOR) 50 MG tablet Take 50 mg by mouth at bedtime.    Yes [provider]  metoprolol (TOPROL-XL) 100 MG 24 hr tablet Take 100 mg by mouth daily with breakfast.    Yes [provider]  PRESCRIPTION MEDICATION Take 20 mg by mouth See admin instructions. Domperidone 10 mg from [place]: Take 2 tablets (20 mg) by mouth three times daily with meals - for diabetic gastroparesis   Yes [provider]  telmisartan (MICARDIS) 80 MG tablet Take 80 mg by mouth at bedtime.    Yes [provider]  acetaZOLAMIDE (DIAMOX) 250 MG tablet Take 1 tablet (250 mg total) by mouth 3 (three) times daily. Patient not taking: Reported on 05/31/2017 01/15/16   Melvenia Beam, MD   Allergies  Allergen Reactions  . Carbamazepine Hives, Shortness Of Breath and Other (See Comments)  . Penicillins Anaphylaxis and Other (See Comments)      Mother, father and brother have history of anaphylaxis reaction to penicillin so pt does not take   .  Statins Other (See Comments)    Severe muscle weakness, leg numbness, severe headaches, chest pain   . Tricyclic Antidepressants Other (See Comments)    IMPAIRED MEMORY  . Atorvastatin Other (See Comments)    Severe muscle weakness, leg numbness, severe headaches, chest pain   . Ezetimibe Other (See Comments)    Severe muscle weakness, leg numbness, severe headaches, chest pain   . Nortriptyline Other (See Comments)    IMPAIRED MEMORY  . Quinapril Hcl Other (See Comments)    Unknown reaction  . Rifampin Diarrhea  . Metoclopramide Nausea And Vomiting and Rash   Review of Systems  Unable to perform ROS: Other    Physical Exam  Constitutional: She appears ill.  Cardiovascular: Normal rate, regular rhythm and normal heart sounds.   Pulmonary/Chest:  - noted trach, site WNL- patient is able to cough up secretions on her own  Musculoskeletal:  Generalized weakness  Skin: Skin is warm and dry.    Vital Signs: BP 140/69   Pulse 86   Temp 99 F (37.2 C) (Oral)   Resp 16   Ht _0  (1.626 m)   Wt 78 kg (171 lb 15.3 oz)   SpO2 100%   BMI 29.52 kg/m  Pain Assessment: 0-10   Pain Score: Asleep   SpO2: SpO2: 100 % O2 Device:SpO2: 100 % O2 Flow Rate: .O2 Flow Rate (L/min): 5 L/min  IO: Intake/output summary:  Intake/Output Summary (Last 24 hours) at 06/16/17 1058 Last data filed at 06/16/17 1000  Gross per 24 hour  Intake          2198.33 ml  Output             1275 ml  Net           923.33 ml    LBM: Last BM Date: 06/15/17 (rectal tube) Baseline Weight: Weight: 73.3 kg (161 lb 8 oz) Most recent weight: Weight: 78 kg (171 lb 15.3 oz)     Palliative Assessment/Data:     Discussed with Dr. Sherral Hammers and Marni Griffon NP  Time In: 1100 Time Out: 1300 Time Total: 120 minutes Greater than 50%  of this time was spent counseling and coordinating care related to the  above assessment and plan.  Signed by: Wadie Lessen, NP   Please contact Palliative Medicine Team phone at [phone] for questions and concerns.  For individual provider: See Shea Evans

## 2017-06-16 NOTE — Progress Notes (Signed)
Inpatient Rehabilitation  Continuing to follow for timing of medical readiness, insurance authorization, and IP Rehab bed availability.  Plan to update team when I have an insurance.  Call if questions.   Carmelia Roller., CCC/SLP Admission Coordinator  [place]  Cell [phone]

## 2017-06-16 NOTE — Progress Notes (Signed)
PROGRESS NOTE    [name]  IRW:[id number] DOB: 11-15-1950 DOA: 05/31/2017 PCP: [name], MD   Brief Narrative:  66 y.o. WF PMHx Pseudotumor Cerebri , Seizure, HTN, HLD, Diabetes type 2 uncontrolled with Neuropathy,  Apparently c/o fall last Tuesday and on Wednesday had some headache and then on Thursday had  weakness of the right hand, and numbness of the right hand pt has difficulty seeing out of the right eye.  Pt was seen in the office on Friday and had MRI brain 8/13=> Multiple small areas of acute infarct in the left parietal cortex and left occipital cortex. Recommend CTA head and neck to evaluate for source of emboli or stenosis causing these infarcts  Numerous chronic white matter infarcts bilaterally.8/16 pt was in cath lab for diagnostic carotid and cerebral angiogram with vascular surgery when she became suddenly unresponsive with L sided weakness and possible L sided seizure-like activity. CTA revealed bilateral M1 occlusions. She was intubated and taken urgently to IR for revascularization and rapid resolution of embolism.      Subjective:  8/29 alert nods at times that she does not want any therapeutic/diagnostic tests run. However after PALLIATIVE CARE spent approximately 2 hours with patient and family has agreed to move forward with diagnostic/therapeutic care vs hospice. Negative CP, negative SOB, negative abdominal pain, negative N/V      Assessment & Plan:   Principal Problem:   Acute CVA (cerebrovascular accident) ([place]) Active Problems:   Type II diabetes mellitus with complication ([place])   Hypertension   Hyponatremia   Stenosis of left carotid artery   Acute respiratory failure with hypoxemia (HCC)   Status post tracheostomy ([place])   Acute embolic stroke (HCC)   Benign essential HTN   Diabetic peripheral neuropathy (HCC)   Pseudotumor cerebri   Hypokalemia   Seizure (HCC)   Leukocytosis   Acute blood loss anemia   Post-operative pain  Encounter for feeding tube placement   Respiratory failure ([place])  Acute embolic stroke -Bilateral MCA occlusion, LICA stenosis S/P revascularization by IR, -8/28 failed swallow evaluation, continue NPO -Stroke team on board -Aspirin + Plavix  -CIR: Evaluated patient on 8/28 initiating insurance authorization. -Requested PICC line placement   Altered mental status -Multifactorial acute ischemic stroke, MSSA bronchitis/pneumonia -Resolved  Acute respiratory failure with hypoxia/Vocal Cord Edema and stridor  -Patient S/P tracheostomy 8/23 -Currently on trach collar tolerating well -Speech continue to work with patient increase PMV use. [facility] M change #6 trach cuff--> #6 trach cuffless -Weaning protocol daily, hold is for decannulation prior to discharge to rehabilitation  positive MSSA Pneumonia bilateral lower lobes -Complete 7 days antibiotic -DuoNeb QID -Frequent suctioning  Chronic diastolic CHF -Strict in and out -Daily weight -Transfuse for hemoglobin<8 -8/29 transfuse 1 unit PRBC   Essential HTN  -SBP goal 120-140 -Clonidine patch 0.3 mg -Metoprolol 100 mg BID -Labetalol PRN  Diabetes type 2 uncontrolled with complications  -7/61 Hemoglobin A1c = 9.5 -8/29 increase Lantus 17 units daily -NovoLog 4 units q 4 hr -Moderate SSI  Acute Urinary retention -Place Foley -Strict in and out since admission +8.4 L -Daily weight Filed Weights   06/14/17 0325 06/15/17 0420 06/16/17 0450  Weight: 166 lb 7.2 oz (75.5 kg) 170 lb 10.2 oz (77.4 kg) 171 lb 15.3 oz (78 kg)    Diabetic Gastroparesis -Resolved    Dysphagia -Failed swallow evaluation  -If patient's swallowing does not improve within the next 3 days will need to discuss PEG placement  Protein calorie malnutrition -  Vital 1.5 goal rate 25 ml/hr . Goal tolerating well   Hormone replacement therapy -Restart Ogen 0.75 mg daily    Goals care -8/28 PALLIATIVE CARE: Patient has emphatically stated does not want  further diagnostic/therapeutic modalities. Discussed with patient and family change of code status, short-term  Vs long-term goals of care, hospice  -8/29 spoke at length with daughter and husband patient has agreed to proceed with diagnostic/therapeutic care.     DVT prophylaxis: Lovenox Code Status: Full Family Communication: Spoke with with husband and he was unable to convince patient to allow diagnostic/therapeutic modalities. Has contacted his daughter who is going to come up and try to commence patient in her best interest to allow [place] to treat further.  Disposition Plan: CIR?   Consultants:  [facility] M Neurology Stroke team Vascular surgery     Procedures/Significant Events:  8/12 MR brain:  -Multiple small areas of acute infarct in the left parietal cortex and left occipital cortex. - Numerous chronic white matter infarcts bilaterally 8/14 Echocardiogram: LVEF=: 60% to 65%. -Grade 1 diastolic dysfunction. 8/16 CTA head:-Right . Right M1 embolus with subtotal occlusion but severe stenosis.  -Elongated embolus left M1 and proximal M2 vessels. -Carotid bifurcation atherosclerosis with approximately 50-60% left ICA bulb narrowing. Visually the stenosis appears worse due to positive remodeling at the low-density plaque. 8/16 CT head-Several small foci of enhancement within bilateral parietal and left frontal cortex as well as left basal ganglia probably representing enhancing infarctions. 8/14Echocardiogram-LVEF= 60% to 65%. -Grade 1 diastolic dysfunction 8/50 - sudden change in mental status, to IR for bilateral embolectomy for revascularization of bilat MCA occlusions  8/17 MRI Brain:-Numerous acute new supra- and infratentorial infarcts spanning multiple vascular territories consistent with embolic phenomena. Nohemorrhagic conversion.   I have personally reviewed and interpreted all radiology studies and my findings are as above.  VENTILATOR SETTINGS: Trach collar FiO2  20%   Cultures 8/17 MRSA by PCR negative 8/20 Tracheal Aspirate positive MSSA      Antimicrobials:Anti-infectives    Start     Stop   06/09/17 1200  ceFAZolin (ANCEF) IVPB 2g/100 mL premix         06/03/17 1126  vancomycin (VANCOCIN) 1-5 GM/200ML-% IVPB    Comments:  Duininck, Stacey   : cabinet override   06/03/17 2774       Devices    LINES / TUBES:  ETT 8/16>>>8/20>>>8/23 R rad aline 8/16>>>8/17 Tracheostomy 8/23>>>     Continuous Infusions: . sodium chloride 50 mL/hr at 06/15/17 2129  .  ceFAZolin (ANCEF) IV Stopped (06/16/17 0533)  . famotidine (PEPCID) IV Stopped (06/15/17 1402)  . feeding supplement (VITAL 1.5 CAL) 1,000 mL (06/16/17 0248)     Objective: Vitals:   06/16/17 0014 06/16/17 0112 06/16/17 0315 06/16/17 0450  BP:   (!) 123/53   Pulse:   82   Resp:   (!) 21   Temp: 98.9 F (37.2 C)   97.9 F (36.6 C)  TempSrc: Oral   Oral  SpO2:  100% 100%   Weight:    171 lb 15.3 oz (78 kg)  Height:        Intake/Output Summary (Last 24 hours) at 06/16/17 0748 Last data filed at 06/16/17 0503  Gross per 24 hour  Intake          2348.33 ml  Output              400 ml  Net          1948.33 ml  Filed Weights   06/14/17 0325 06/15/17 0420 06/16/17 0450  Weight: 166 lb 7.2 oz (75.5 kg) 170 lb 10.2 oz (77.4 kg) 171 lb 15.3 oz (78 kg)   Physical Exam:  General: Alert, refuses initially all therapeutic modalities, positive acute respiratory distress Neck:  Negative scars, masses, torticollis, lymphadenopathy, JVD, #6 cuffed trach in place negative sign of infection Lungs: diffuse rhonchi, without wheezes or crackles  Cardiovascular: Regular rate and rhythm without murmur gallop or rub normal S1 and S2 Abdomen: negative abdominal pain, nondistended, positive soft, bowel sounds, no rebound, no ascites, no appreciable mass Extremities: No significant cyanosis, clubbing, or edema bilateral lower extremities Skin: Negative rashes, lesions,  ulcers Psychiatric:  Positive depression depression, positive anxiety, negative fatigue, negative mania  Central nervous system:  Patient refuses to cooperate with exam today      Data Reviewed: Care during the described time interval was provided by me .  I have reviewed this patient's available data, including medical history, events of note, physical examination, and all test results as part of my evaluation.   CBC:  Recent Labs Lab 06/10/17 0630 06/13/17 0250 06/14/17 0309 06/15/17 1854  WBC 15.3* 12.5* 11.0* 10.4  HGB 8.9* 9.6* 8.1* 7.6*  HCT 27.2* 30.5* 26.5* 25.0*  MCV 88.3 89.4 89.5 89.9  PLT 362 480* 405* 323   Basic Metabolic Panel:  Recent Labs Lab 06/09/17 2346 06/10/17 0630 06/12/17 0530 06/13/17 0250 06/14/17 0309 06/15/17 1854  NA 142 140 140 144 141 138  K 3.2* 3.7 2.9* 4.1 3.0* 4.0  CL 108 107 97* 105 105 106  CO2 _0 GLUCOSE 177* 152* 209* 67 78 248*  BUN 31* 29* 36* 36* 40* 33*  CREATININE 0.85 0.86 0.83 0.82 0.88 0.82  CALCIUM 8.4* 8.2* 8.6* 9.1 8.5* 8.1*  MG 1.8 1.8  --  2.2  --  1.8  PHOS  --   --   --  2.3*  --   --    GFR: Estimated Creatinine Clearance: 69.1 mL/min (by C-G formula based on SCr of 0.82 mg/dL). Liver Function Tests: No results for input(s): AST, ALT, ALKPHOS, BILITOT, PROT, ALBUMIN in the last 168 hours. No results for input(s): LIPASE, AMYLASE in the last 168 hours. No results for input(s): AMMONIA in the last 168 hours. Coagulation Profile:  Recent Labs Lab 06/10/17 0630 06/10/17 0808  INR QUESTIONABLE RESULTS, RECOMMEND RECOLLECT TO VERIFY 0.99   Cardiac Enzymes: No results for input(s): CKTOTAL, CKMB, CKMBINDEX, TROPONINI in the last 168 hours. BNP (last 3 results) No results for input(s): PROBNP in the last 8760 hours. HbA1C: No results for input(s): HGBA1C in the last 72 hours. CBG:  Recent Labs Lab 06/15/17 1200 06/15/17 1623 06/15/17 1953 06/16/17 0016 06/16/17 0447  GLUCAP 166*  151* 209* 145* 192*   Lipid Profile: No results for input(s): CHOL, HDL, LDLCALC, TRIG, CHOLHDL, LDLDIRECT in the last 72 hours. Thyroid Function Tests: No results for input(s): TSH, T4TOTAL, FREET4, T3FREE, THYROIDAB in the last 72 hours. Anemia Panel: No results for input(s): VITAMINB12, FOLATE, FERRITIN, TIBC, IRON, RETICCTPCT in the last 72 hours. Urine analysis: No results found for: COLORURINE, APPEARANCEUR, LABSPEC, [place], GLUCOSEU, HGBUR, BILIRUBINUR, KETONESUR, PROTEINUR, UROBILINOGEN, NITRITE, LEUKOCYTESUR Sepsis Labs: _1 (procalcitonin:4,lacticidven:4)  ) Recent Results (from the past 240 hour(s))  Culture, respiratory (NON-Expectorated)     Status: None   Collection Time: 06/07/17 11:58 AM  Result Value Ref Range Status   Specimen Description TRACHEAL ASPIRATE  Final   Special  Requests Normal  Final   Gram Stain   Final    RARE WBC PRESENT, PREDOMINANTLY PMN ABUNDANT GRAM POSITIVE COCCI    Culture   Final    ABUNDANT STAPHYLOCOCCUS AUREUS MODERATE STREPTOCOCCUS,BETA HEMOLYTIC NOT GROUP A    Report Status 06/09/2017 FINAL  Final   Organism ID, Bacteria STAPHYLOCOCCUS AUREUS  Final      Susceptibility   Staphylococcus aureus - MIC*    CIPROFLOXACIN <=0.5 SENSITIVE Sensitive     ERYTHROMYCIN 0.5 SENSITIVE Sensitive     GENTAMICIN <=0.5 SENSITIVE Sensitive     OXACILLIN 0.5 SENSITIVE Sensitive     TETRACYCLINE <=1 SENSITIVE Sensitive     VANCOMYCIN <=0.5 SENSITIVE Sensitive     TRIMETH/SULFA <=10 SENSITIVE Sensitive     CLINDAMYCIN <=0.25 SENSITIVE Sensitive     RIFAMPIN <=0.5 SENSITIVE Sensitive     Inducible Clindamycin NEGATIVE Sensitive     * ABUNDANT STAPHYLOCOCCUS AUREUS         Radiology Studies: Dg Abd Portable 1v  Result Date: 06/14/2017 CLINICAL DATA:  Abdominal pain and constipation. EXAM: PORTABLE ABDOMEN - 1 VIEW COMPARISON:  Lower chest and upper abdominal radiograph of June 04, 2017 FINDINGS: The feeding tube tip projects in the  distal duodenum or proximal jejunum. There is no small or large bowel obstructive pattern. The stool and gas pattern within the colon and rectum appears normal. There is a radiodense structure that projects in the region of the anal verge that is of uncertain significance. The bony structures exhibit no acute abnormalities. IMPRESSION: No evidence of bowel obstruction or ileus. The colonic stool burden does not appear excessive. The feeding tube tip projects in the distal duodenum or proximal jejunum. Electronically Signed   By: David  Martinique M.D.   On: 06/14/2017 09:55        Scheduled Meds: . acetylcysteine  2 mL Nebulization Q6H WA  . aspirin  325 mg Per Tube Daily  . chlorhexidine gluconate (MEDLINE KIT)  15 mL Mouth Rinse BID  . ciprofloxacin-dexamethasone  4 drop Right EAR BID  . cloNIDine  0.3 mg Transdermal Weekly  . clopidogrel  75 mg Per Tube Q breakfast  . docusate  100 mg Oral Daily  . enoxaparin (LOVENOX) injection  40 mg Subcutaneous Q24H  . estropipate  0.75 mg Oral Daily  . feeding supplement (PRO-STAT SUGAR FREE 64)  30 mL Per Tube Daily  . insulin aspart  0-15 Units Subcutaneous Q4H  . insulin aspart  4 Units Subcutaneous Q4H  . insulin glargine  12 Units Subcutaneous Daily  . Investigational - Study Medication  20 mg Per Tube TID  . ipratropium-albuterol  3 mL Nebulization Q6H  . mouth rinse  15 mL Mouth Rinse QID  . metoprolol tartrate  100 mg Per Tube BID   Continuous Infusions: . sodium chloride 50 mL/hr at 06/15/17 2129  .  ceFAZolin (ANCEF) IV Stopped (06/16/17 0533)  . famotidine (PEPCID) IV Stopped (06/15/17 1402)  . feeding supplement (VITAL 1.5 CAL) 1,000 mL (06/16/17 0248)     LOS: 16 days    Time spent: 40 minutes    [place] Gipe, Geraldo Docker, MD [facility] Pager [phone]   If 7PM-7AM, please contact night-coverage [email or web address] Password [facility] 06/16/2017, 7:48 AM

## 2017-06-16 NOTE — Progress Notes (Signed)
  Speech Language Pathology Treatment: [name] Speaking valve  Patient Details Name: [name] MRN: [id number] DOB: 1951-04-08 Today's Date: 06/16/2017 Time: 5427-0623 SLP Time Calculation (min) (ACUTE ONLY): 17 min  Assessment / Plan / Recommendation Clinical Impression  SLP attempted finger occlusion and brief PMV placement s/p trach change. She is able to show airflow through her upper airway during blowing and phonatory tasks, saying her name with Mod cues for increased volume. Her voice is wet and low in intensity, but she does produce phonation. She counted from 1-10 also with cues needed to increase volume. No evidence of air trapping was noted within the first few minutes of placement, but then pt started coughing and expelled the PMV from her trach hub, followed by a very small amount of thick secretions. Pt declined further PMV trials this afternoon. SLP discussed with pt/family a goal of wearing the PMV for 10-15 minute periods before completing MBS to assess swallowing function. Pt, husband, and daughter were in agreement with plan being to retry PMV tomorrow.   HPI HPI: 66yo female with hx HTN, DM who presented initially 8/13 to her PCP with headache, R sided weakness and vision changes. She was sent for MRI which revealed multiple areas of acute L MCA infarct. She was sent to ER and admitted for CVA workup. On 8/16 pt was in cath lab for diagnostic carotid and cerebral angiogram with vascular surgery when she became suddenly unresponsive with L sided weakness and possible L sided seizure-like activity. CTA revealed bilateral M1 occlusions. She was intubated and taken urgently to IR for revascularization and rapid resolution of embolism. ETT 8/16-8/20, 8/20-8/23, trach 8/23, pt tolerated trach collar overnight.      SLP Plan  Continue with current plan of care       Recommendations         Patient may use Passy-Muir Speech Valve: with SLP only PMSV Supervision: Full          Oral Care Recommendations: Oral care QID Follow up Recommendations: Inpatient Rehab SLP Visit Diagnosis: Aphonia (R49.1) Plan: Continue with current plan of care       GO                [name] 06/16/2017, 2:52 PM  [name], M.A. CCC-SLP [phone]

## 2017-06-16 NOTE — Progress Notes (Signed)
  Pt refusing all labs including type and screen. Goals of care meeting scheduled for husband at [place] to attending MD

## 2017-06-16 NOTE — Procedures (Signed)
Tracheostomy tube change: Informed verbal consent was obtained after explaining the risks (including bleeding and infection), benefits and alternatives of the procedure. Verbal timeout was performed prior to the procedure. The old  #6 cuffled trach was carefully removed. the tracheostomy site appeared: unremarkable. A new #  Cuff 6 cuffless  trach was easily placed in the tracheostomy stoma and secured with velcro trach ties. The tracheostomy was patent, good color change observed via EZ-CAP. She had sig coughing after so I held off from finger occlusion.   Erick Colace ACNP-BC [place] Pager # [phone] OR # [phone] if no answer

## 2017-06-16 NOTE — Progress Notes (Signed)
PULMONARY / CRITICAL CARE MEDICINE   Name: [name] MRN: [id number] DOB: 03/22/1951    ADMISSION DATE:  05/31/2017 CONSULTATION DATE:  8/16  REFERRING MD:  Erlinda Hong   CHIEF COMPLAINT:  Vent management   HISTORY OF PRESENT ILLNESS:   66yo female with hx HTN, DM who presented initially 8/13 to her PCP with headache, R sided weakness and vision changes.  She was sent for MRI which revealed multiple areas of acute L MCA infarct.  She was sent to ER and admitted for CVA workup. On 8/16 pt was in cath lab for diagnostic carotid and cerebral angiogram with vascular surgery when she became suddenly unresponsive with L sided weakness and possible L sided seizure-like activity.  CTA revealed bilateral M1 occlusions.  She was intubated and taken urgently to IR for revascularization and rapid resolution of embolism.  PCCM consulted for ICU/vent management post procedure.   SUBJECTIVE:   Upset this am.  Refuses all labs, medications until husband gets here. Goals of care meeting scheduled for 11am.  Refused exam.  Did not want to speak to me.   VITAL SIGNS: BP 140/69   Pulse 86   Temp 99 F (37.2 C) (Oral)   Resp 16   Ht 5\' 4"  (1.626 m)   Wt 78 kg (171 lb 15.3 oz)   SpO2 100%   BMI 29.52 kg/m   HEMODYNAMICS:    VENTILATOR SETTINGS: FiO2 (%):  [28 %] 28 %  INTAKE / OUTPUT:  Intake/Output Summary (Last 24 hours) at 06/16/17 1025 Last data filed at 06/16/17 0503  Gross per 24 hour  Intake          2198.33 ml  Output              400 ml  Net          1798.33 ml   General:  Refused exam, in NAD HEENT: MM pink/moist PSY: Neuro: awake, agitated, angry appearing  CV:  PULM: even/non-labored on ATC GI: Extremities:  Skin: no rashes or lesions   LABS:  BMET  Recent Labs Lab 06/13/17 0250 06/14/17 0309 06/15/17 1854  NA 144 141 138  K 4.1 3.0* 4.0  CL 105 105 106  CO2 28 27 23   BUN 36* 40* 33*  CREATININE 0.82 0.88 0.82  GLUCOSE 67 78 248*   Electrolytes  Recent  Labs Lab 06/10/17 0630  06/13/17 0250 06/14/17 0309 06/15/17 1854  CALCIUM 8.2*  < > 9.1 8.5* 8.1*  MG 1.8  --  2.2  --  1.8  PHOS  --   --  2.3*  --   --   < > = values in this interval not displayed.  CBC  Recent Labs Lab 06/13/17 0250 06/14/17 0309 06/15/17 1854  WBC 12.5* 11.0* 10.4  HGB 9.6* 8.1* 7.6*  HCT 30.5* 26.5* 25.0*  PLT 480* 405* 377   Coag's  Recent Labs Lab 06/10/17 0630 06/10/17 0808  INR QUESTIONABLE RESULTS, RECOMMEND RECOLLECT TO VERIFY 0.99   Sepsis Markers No results for input(s): LATICACIDVEN, PROCALCITON, O2SATVEN in the last 168 hours.  ABG No results for input(s): PHART, PCO2ART, PO2ART in the last 168 hours. Liver Enzymes No results for input(s): AST, ALT, ALKPHOS, BILITOT, ALBUMIN in the last 168 hours.  Cardiac Enzymes No results for input(s): TROPONINI, PROBNP in the last 168 hours.  Glucose  Recent Labs Lab 06/15/17 0803 06/15/17 1200 06/15/17 1623 06/15/17 1953 06/16/17 0016 06/16/17 0447  GLUCAP 218* 166* 151* 209* 145* 192*  Imaging No results found.  STUDIES:  MR brain 8/12>>> Multiple small areas of acute infarct in the left parietal cortex and left occipital cortex. Recommend CTA head and neck to evaluate for source of emboli or stenosis causing these infarcts. Numerous chronic white matter infarcts bilaterally CTA head 8/16>>> 1. Right M1 embolus with subtotal occlusion but severe stenosis. The anterior temporal branch is covered and non-opacified. 2. Elongated embolus within the left M1 and proximal M2 vessels. 3. Negative for dissection or interval ulceration in the arch and cervical carotids. 4. Carotid bifurcation atherosclerosis with approximately 50-60% left ICA bulb narrowing. Visually the stenosis appears worse due to positive remodeling at the low-density plaque. 5. Moderate atheromatous narrowing of the right V4 and mid basilar. CT head 8/16>>> 1. Several small foci of enhancement within bilateral  parietal and left frontal cortex as well as left basal ganglia probably representing enhancing infarctions. 2. No large hemorrhage, focal mass effect, herniation, or Hydrocephalus. 2D echo 8/14>>>  - Left ventricle: The cavity size was normal. Systolic function was normal. The estimated ejection fraction was in the range of 60% to 65%. Wall motion was normal; there were no regional wall motion abnormalities. Doppler parameters are consistent with abnormal left ventricular relaxation (grade 1 diastolic dysfunction). MRI:  06/04/2017>>> Numerous acute new supra- and infratentorial infarcts spanning multiple vascular territories consistent with embolic phenomena. Nohemorrhagic conversion.Moderate chronic small vessel ischemic disease.  CULTURES: Tracheal aspirate 8/20 >> MSSA  ANTIBIOTICS: cefazolin 8/22 >>   SIGNIFICANT EVENTS: 8/16 - sudden change in mental status, to IR for bilateral embolectomy for revascularization of bilat MCA occlusions   LINES/TUBES: ETT 8/16>>>8/20>>>8/23 R rad aline 8/16>>>8/17 Trach 8/23>>>   ASSESSMENT / PLAN:  Bilateral acute MCA occlusion- s/p IR revascularization  Left ICA stenosis Acute respiratory failure - r/t AMS  Stridor, vocal cord edema Tracheostomy status  Hx HTN  Vomited 8/17 , Coretrack out 8/17  ABD film neg 8/17  Gastroparesis  Bilateral MCA occlusion  L ICA stenosis  Leukocytosis>> ? Reactive>resolved  MSSA bronchitis versus pneumonia DM - Hgb A1c >9   66yo female admitted with acute L MCA stroke, then acute R MCA occlusion during diagnostic arteriogram with AMS requiring intubation.  Taken to IR for revascularization. Reintubated on 8/20 for stridor. Left lower lobe atelectasis/infiltrate, MSSA on respiratory culture, abx started 8/22.    Pulmonary problem list  Tracheostomy dependence s/p acute CVA c/w post-intubation stridor, poor airway clearance and strep (beta-hemolytic & MSSA PNA) 8/25 to 8/27:  Seen by SLP working on  PMV Had what sounds like reflux +/- penetration after bedside swallow eval ->cont think that this is the biggest issue (gastroparesis/reflux).  Plan Cont reflux and aspiration precautions abx per primary  Cont rehab efforts Cont domperidone  Speech eval  Will plan to downsize to #6 cuffless trach today if pt will agree.  Have asked RN to order trach to bedside.    Nickolas Madrid, NP 06/16/2017  10:25 AM Pager: [phone] or [phone]

## 2017-06-16 NOTE — Progress Notes (Signed)
RT attempted to sterile suction patient trach. Pt refusing mouthing no over and over while motioning with her fists in a punching motion. RT advised pt she needed to try and cough up secretions if she would not allow me to suction her. Pt has strong cough but not productive.

## 2017-06-16 NOTE — Progress Notes (Signed)
Occupational Therapy Treatment Patient Details Name: [name] MRN: [id number] DOB: 12-Aug-1951 Today's Date: 06/16/2017    History of present illness Pt is a 66 y/o female with hx HTN, DM. She presented initially 8/13 to her PCP with headache, R hight weakness and vision changes. She was sent for MRI which revealed multiple areas of acute L MCA infarct. She was sent to ER and admitted for CVA workup. On 8/16 pt was in cath lab for diagnostic carotid and cerebral angiogram with vascular surgery when she became suddenly unresponsive with L sided weakness and possible L sided seizure-like activity. CTA revealed bilateral M1 occlusions. She was intubated and taken urgently to IR for revascularization and rapid resolution of embolism. Pt with ETT 8/16-8/20, 8/20-8/23, trach 8/23. Pt now on trach collar.   OT comments  Pt with improved bed mobility, requiring min assist of one to achieve EOB. Pt with decreased tolerance, sat EOB with min guard assist x 5 minutes and then requesting to return to supine with complaints of headache. Pt washing face at bed level. Appearing frustrated by inability to communicate due to trach. Continue to recommend inpatient rehab.   Follow Up Recommendations  CIR;Supervision/Assistance - 24 hour    Equipment Recommendations       Recommendations for Other Services      Precautions / Restrictions Precautions Precautions: Fall Precaution Comments: cortrak, trach       Mobility Bed Mobility Overal bed mobility: Needs Assistance Bed Mobility: Supine to Sit;Sit to Supine     Supine to sit: Min assist Sit to supine: Max assist   General bed mobility comments: cues for technique, min assist to raise trunk to get to EOB, max assist for trunk and to assist LEs into bed.  Transfers                      Balance Overall balance assessment: Needs assistance Sitting-balance support: Feet supported;Bilateral upper extremity supported Sitting  balance-Leahy Scale: Fair Sitting balance - Comments: statically x 5 minutes       Standing balance comment: declined attempt                           ADL either performed or assessed with clinical judgement   ADL Overall ADL's : Needs assistance/impaired Eating/Feeding: NPO   Grooming: Wash/dry face;Bed level;Set up               Lower Body Dressing: Total assistance;Bed level Lower Body Dressing Details (indicate cue type and reason): to don socks               General ADL Comments: tolerated sitting EOB without support x 5 minutes, then asking to return to supine     Vision       Perception     Praxis      Cognition Arousal/Alertness: Awake/alert Behavior During Therapy: WFL for tasks assessed/performed Overall Cognitive Status: Difficult to assess                                 General Comments: follows commands, questionable perseveration when attempting to write         Exercises     Shoulder Instructions       General Comments      Pertinent Vitals/ Pain       Pain Assessment: Faces Faces Pain Scale: Hurts little more  Pain Location: head Pain Descriptors / Indicators: Discomfort;Grimacing Pain Intervention(s): Monitored during session;Patient requesting pain meds-RN notified  Home Living                                          Prior Functioning/Environment              Frequency  Min 2X/week        Progress Toward Goals  OT Goals(current goals can now be found in the care plan section)  Progress towards OT goals: Progressing toward goals  Acute Rehab OT Goals Patient Stated Goal: none stated OT Goal Formulation: With patient Time For Goal Achievement: 06/21/17 Potential to Achieve Goals: Good  Plan Discharge plan remains appropriate    Co-evaluation                 AM-PAC PT "6 Clicks" Daily Activity     Outcome Measure   Help from another person eating  meals?: Total Help from another person taking care of personal grooming?: A Lot Help from another person toileting, which includes using toliet, bedpan, or urinal?: Total Help from another person bathing (including washing, rinsing, drying)?: Total Help from another person to put on and taking off regular upper body clothing?: A Lot Help from another person to put on and taking off regular lower body clothing?: Total 6 Click Score: 8    End of Session Equipment Utilized During Treatment: Oxygen  OT Visit Diagnosis: Muscle weakness (generalized) (M62.81)   Activity Tolerance Patient limited by fatigue   Patient Left in bed;with call bell/phone within reach;with bed alarm set   Nurse Communication Patient requests pain meds        Time: 5449-2010 OT Time Calculation (min): 26 min  Charges: OT General Charges $OT Visit: 1 Visit OT Treatments $Therapeutic Activity: 23-37 mins  06/16/2017 Nestor Lewandowsky, OTR/L Pager: [phone]   Malka So 06/16/2017, 10:03 AM

## 2017-06-16 NOTE — Progress Notes (Signed)
Trach just downsized to 6 cuffless.  -goal: -PMV as much as possible starting 8/30 Once tolerating this AND able to expectorate sputum from mouth we will attempt capping trials from size 6. -If [name] tolerates this, then we will assess her for decannulation.  -If [name] does not tolerate #6 capping trials will change to #4 cuffless and re-attempt capping trials again. If still not able to tolerate but does well w/ PMV THEN we will ask ENT to see. I suspect however [name] will be a candidate to decannulate before [name] leaves the hospital  Erick Colace ACNP-BC [facility] Pager # [phone] OR # [phone] if no answer

## 2017-06-16 NOTE — Care Management Important Message (Signed)
Important Message  Patient Details  Name: [name] MRN: [id number] Date of Birth: [birth year]   Medicare Important Message Given:  Yes    Nathen May 06/16/2017, 8:52 AM

## 2017-06-16 NOTE — Progress Notes (Signed)
Speech-Language Pathology Note:  SLP discussed pt's care with RN, RT, and PCCM MD regarding trach change. Per PCCM, plan is still for pt to have her trach changed today. SLP will f/u with pt after this has been done.   Pt has not been able to wear PMV due to signs of decreased upper airway patency, therefore trach downsize will hopefully facilitate her ability to utilize the valve. She does show signs of cognitive-linguistic deficits too though, which may still impact her ability to communicate (see SLP cognitive-linguistic evaluation from previous date for additional details).   SLP will f/u as able.   [name], M.A. CCC-SLP [phone]

## 2017-06-17 ENCOUNTER — Inpatient Hospital Stay (HOSPITAL_COMMUNITY): Payer: Medicare Other

## 2017-06-17 DIAGNOSIS — R531 Weakness: Secondary | ICD-10-CM

## 2017-06-17 LAB — CBC
HCT: 30.2 % — ABNORMAL LOW (ref 36.0–46.0)
HEMOGLOBIN: 9.4 g/dL — AB (ref 12.0–15.0)
MCH: 27.2 pg (ref 26.0–34.0)
MCHC: 31.1 g/dL (ref 30.0–36.0)
MCV: 87.3 fL (ref 78.0–100.0)
Platelets: 401 10*3/uL — ABNORMAL HIGH (ref 150–400)
RBC: 3.46 MIL/uL — AB (ref 3.87–5.11)
RDW: 15.7 % — ABNORMAL HIGH (ref 11.5–15.5)
WBC: 11.5 10*3/uL — AB (ref 4.0–10.5)

## 2017-06-17 LAB — TYPE AND SCREEN
ABO/RH(D): A POS
Antibody Screen: NEGATIVE
Unit division: 0

## 2017-06-17 LAB — BPAM RBC
Blood Product Expiration Date: 201809042359
ISSUE DATE / TIME: 201808291534
Unit Type and Rh: 6200

## 2017-06-17 LAB — BASIC METABOLIC PANEL
Anion gap: 7 (ref 5–15)
BUN: 18 mg/dL (ref 6–20)
CALCIUM: 8.5 mg/dL — AB (ref 8.9–10.3)
CHLORIDE: 106 mmol/L (ref 101–111)
CO2: 28 mmol/L (ref 22–32)
CREATININE: 0.65 mg/dL (ref 0.44–1.00)
GFR calc non Af Amer: >60 mL/min (ref >60)
Glucose, Bld: 75 mg/dL (ref 65–99)
Potassium: 4 mmol/L (ref 3.5–5.1)
SODIUM: 141 mmol/L (ref 135–145)

## 2017-06-17 LAB — GLUCOSE, CAPILLARY
GLUCOSE-CAPILLARY: 108 mg/dL — AB (ref 65–99)
GLUCOSE-CAPILLARY: 76 mg/dL (ref 65–99)
GLUCOSE-CAPILLARY: 169 mg/dL — AB (ref 65–99)
Glucose-Capillary: 165 mg/dL — ABNORMAL HIGH (ref 65–99)
Glucose-Capillary: 293 mg/dL — ABNORMAL HIGH (ref 65–99)

## 2017-06-17 MED ORDER — TRAMADOL HCL 50 MG PO TABS
50.0000 mg | ORAL_TABLET | Freq: Four times a day (QID) | ORAL | Status: DC | PRN
  Administered 2017-06-17 – 2017-06-18 (×2): 50 mg
  Filled 2017-06-17 (×2): qty 1

## 2017-06-17 MED ORDER — IBUPROFEN 100 MG/5ML PO SUSP: 600.0000 mg | Freq: Three times a day (TID) | ORAL | Status: DC | PRN

## 2017-06-17 MED ORDER — SODIUM CHLORIDE 0.9% FLUSH
10.0000 mL | INTRAVENOUS | Status: DC | PRN
Start: 2017-06-17 — End: 2017-06-18

## 2017-06-17 MED ORDER — ACETAZOLAMIDE 250 MG PO TABS
250.0000 mg | ORAL_TABLET | Freq: Three times a day (TID) | ORAL | Status: DC
  Administered 2017-06-17 – 2017-06-18 (×3): 250 mg
  Filled 2017-06-17 (×6): qty 1

## 2017-06-17 MED ORDER — CHLORHEXIDINE GLUCONATE CLOTH 2 % EX PADS
6.0000 | MEDICATED_PAD | Freq: Every day | CUTANEOUS | Status: DC
  Administered 2017-06-18: 6 via TOPICAL

## 2017-06-17 MED ORDER — ESTROPIPATE 0.75 MG PO TABS
0.7500 mg | ORAL_TABLET | Freq: Every day | ORAL | Status: DC
  Administered 2017-06-18: 0.75 mg
  Filled 2017-06-17: qty 1

## 2017-06-17 MED ORDER — MORPHINE SULFATE (PF) 4 MG/ML IV SOLN: 1.0000 mg | INTRAVENOUS | Status: DC | PRN

## 2017-06-17 MED ORDER — SODIUM CHLORIDE 0.9% FLUSH
10.0000 mL | Freq: Two times a day (BID) | INTRAVENOUS | Status: DC
Start: 2017-06-17 — End: 2017-06-18
  Administered 2017-06-17 – 2017-06-18 (×3): 10 mL

## 2017-06-17 NOTE — Progress Notes (Signed)
Per Rehab admissions coord. pt authorized for IP rehab, however flexi needs to be removed prior. Pt w/ no output past 2 days. Messaged attending MD regarding removal.

## 2017-06-17 NOTE — Progress Notes (Signed)
PT Cancellation Note  Patient Details Name: [name] MRN: [id number] DOB: Sep 22, 1951   Cancelled Treatment:    Reason Eval/Treat Not Completed: Patient at procedure or test/unavailable. Pt at modified barium swallow.  [name] 06/17/2017, 3:06 PM [employer] PT [phone]

## 2017-06-17 NOTE — H&P (Signed)
Physical Medicine and Rehabilitation Admission H&P    Chief Complaint  Patient presents with  . decline in mobility and ability to carry out ADL tasks.    .  stroke with right sided weakness, dysphagia and cognitive deficits.     HPI:  [name] is a 66 y.o. 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 She was sent to the ED for full work up. History taken from chart review. Carotid dopplers done revealing L-40-59% ICA stenosis. Cardiac echo revealed  EF 60-65% with no wall abnormality and grade 1 diastolic dysfunction.  CTA head/neck done revealing large non-calcified plaque in proximal L-ICA causing 60-65% stenosis and likely source of emboli.   She underwent cerebral angiogram for work up on 8/16 revealing < 50% L-ICA stenosis and during the procedure became unresponsive with bilateral UE tonic posturing and left sided weakness. She  was taken to IR for endovascualr revascularization of B-MCA occlusions with TICI reperfusion bilaterally. Dr. Erlinda Hong recommended DAPT x 3 months followed by Plavix alone. EEG revealed mild generalized dysfunction and intermittent BUE twitching not associated with epileptiform activity. Follow up MRI brain reviewed, showing multiple infarcts.  Per report, numerous acute new supra and infratentorial infarcts involving B-MCA, and bilateral PCA- no hemorrhage.  She was briefly extubated on 8/20 but developed stridor with increase WOB requiring reintubation and beside tracheostomy on 8/23 by Dr. Nelda Marseille.     She was extubated to ATC and downsized to CFS #6 on 8/29. She is tolerating PMSV for 20 minutes with imporved phonation and breath support.  She has had issues with copious secretions and mucomyst added. Tracheal secretion positive for MSSA and she was  started on cefazolin 8/22 for treatment of MSSA bronchitis v/s PNA.  She did have drop in H/H to 7.6 and transfused with one unit PRBC on 8/29. MBS done 8/30 showing dysphagia due to sensorimotor deficits and silent aspiration during swallow and remains NPO but to start therapeutic trials with ST. She is showing improvement in participation with encouragement and is tolerating activity at EOB.  CIR recommended due to decline in mobility and ability to carry out ADL tasks.    Review of Systems  HENT: Positive for ear pain. Negative for hearing loss and tinnitus.   Eyes: Negative for blurred vision and double vision.  Respiratory: Positive for cough. Negative for shortness of breath.   Cardiovascular: Negative for chest pain, palpitations and leg swelling.  Gastrointestinal: Positive for heartburn. Negative for constipation and nausea.  Genitourinary: Negative for dysuria and urgency.  Musculoskeletal: Positive for myalgias. Negative for back pain and joint pain.  Skin: Negative for itching and rash.  Neurological: Positive for sensory change, speech change, focal weakness, weakness and headaches.  Psychiatric/Behavioral: Positive for memory loss. The patient is nervous/anxious and has insomnia (due to HA).       Past Medical History:  Diagnosis Date  . Anemia    takes iron supplement  . Anesthesia complication    disorientation due to pseudotumor  . Diabetes mellitus    IDDM  . Family history of anesthesia complication 27 yrs ago   brother stopped breathing for a minute or two  . GERD (gastroesophageal reflux disease)   . Headache(784.0)    due to pseudotumor; daily headache  . Hypercholesteremia    unable  to tolerate statins  . Hypertension    under control with meds., has been on med. x 5 yr.  . Peripheral neuropathy   . PONV (postoperative nausea and vomiting)   . 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 Seizures ([place])    due to shunt failure; last seizure 2007    . Synovitis of ankle 04/2013   left  . Wears dentures    full    Past Surgical History:  Procedure Laterality Date  . ANKLE ARTHROSCOPY Left 05/18/2013   Procedure: LEFT ANKLE ARTHROSCOPY WITH DEBRIDEMENT,  SUBTALAR OPEN DEBRIDEMENT ;  Surgeon: Wylene Simmer, MD;  Location: [facility];  Service: Orthopedics;  Laterality: Left;  . APPENDECTOMY     as a child  . BALLOON DILATION N/A 07/04/2013   Procedure: BALLOON DILATION;  Surgeon: Garlan Fair, MD;  Location: [facility];  Service: Endoscopy;  Laterality: N/A;  . BLADDER SUSPENSION    . BREAST LUMPECTOMY W/ NEEDLE LOCALIZATION Left 05/22/2011  . CAROTID ANGIOGRAPHY Bilateral 06/03/2017   Procedure: Bilateral Carotid Angiography;  Surgeon: Conrad [place], MD;  Location: [facility];  Service: Cardiovascular;  Laterality: Bilateral;  . COLONOSCOPY WITH PROPOFOL N/A 05/14/2014   Procedure: COLONOSCOPY WITH PROPOFOL;  Surgeon: Garlan Fair, MD;  Location: [facility];  Service: Endoscopy;  Laterality: N/A;  . ESOPHAGEAL DILATION  06/23/2006; 08/05/2004  . ESOPHAGOGASTRODUODENOSCOPY N/A 07/04/2013   Procedure: ESOPHAGOGASTRODUODENOSCOPY (EGD);  Surgeon: Garlan Fair, MD;  Location: [facility];  Service: Endoscopy;  Laterality: N/A;  . ESOPHAGOGASTRODUODENOSCOPY (EGD) WITH PROPOFOL N/A 05/14/2014   Procedure: ESOPHAGOGASTRODUODENOSCOPY (EGD) WITH PROPOFOL;  Surgeon: Garlan Fair, MD;  [facility]: [facility];  Service: Endoscopy;  Laterality: N/A;  . IR ANGIO VERTEBRAL SEL SUBCLAVIAN INNOMINATE UNI R MOD SED  06/03/2017  . IR PERCUTANEOUS ART THROMBECTOMY/INFUSION INTRACRANIAL INC DIAG ANGIO  06/03/2017  . IR PERCUTANEOUS ART THROMBECTOMY/INFUSION INTRACRANIAL INC DIAG ANGIO  06/03/2017  . LUMBAR PERITONEAL SHUNT     x 2  . OVARIAN CYST REMOVAL     age 37  . RADIOLOGY WITH ANESTHESIA N/A 06/03/2017   Procedure: RADIOLOGY WITH ANESTHESIA;  Surgeon: Luanne Bras, MD;  Location: [facility];  Service: Radiology;   Laterality: N/A;  . SHUNT REVISION  2007  . TOTAL ABDOMINAL HYSTERECTOMY W/ BILATERAL SALPINGOOPHORECTOMY  1994   age 52s  . WRIST SURGERY Left 2014    [name]    Family History  Problem Relation Age of Onset  . Heart disease Father   . Migraines Neg Hx     Social History:  Married--lives with family. Husband works part time. Independent with AD in the home. Per  reports that she has never smoked. She has never used smokeless tobacco. Per reports she does not drink alcohol or use drugs.    Allergies  Allergen Reactions  . Carbamazepine Hives, Shortness Of Breath and Other (See Comments)  . Penicillins Anaphylaxis and Other (See Comments)    Mother, father and brother have history of anaphylaxis reaction to penicillin so pt does not take   . Statins Other (See Comments)    Severe muscle weakness, leg numbness, severe headaches, chest pain   . Tricyclic Antidepressants Other (See Comments)    IMPAIRED MEMORY  . Atorvastatin Other (See Comments)    Severe muscle weakness, leg numbness, severe headaches, chest pain   . Ezetimibe Other (See Comments)    Severe muscle weakness, leg numbness, severe headaches, chest pain   .  Nortriptyline Other (See Comments)    IMPAIRED MEMORY  . Quinapril Hcl Other (See Comments)    Unknown reaction  . Rifampin Diarrhea  . Metoclopramide Nausea And Vomiting and Rash    Medications Prior to Admission  Medication Sig Dispense Refill  . aspirin EC 81 MG tablet Take 81 mg by mouth daily.    . cloNIDine (CATAPRES) 0.3 MG tablet Take 0.3 mg by mouth 2 (two) times daily.     . diclofenac sodium (VOLTAREN) 1 % GEL Apply 2 application topically 4 (four) times daily as needed (pain).     Marland Kitchen esomeprazole (NEXIUM) 40 MG capsule Take 40 mg by mouth daily as needed (acid reflux). Acid reflux    . estropipate (OGEN) 1.5 MG tablet Take 0.75 mg by mouth daily.     . furosemide (LASIX) 20 MG tablet Take 20 mg by mouth daily.    Marland Kitchen gabapentin (NEURONTIN)  100 MG capsule Take 100 mg by mouth at bedtime.    Marland Kitchen glipiZIDE (GLUCOTROL) 10 MG tablet Take 10 mg by mouth 2 (two) times daily before a meal.     . insulin glargine (LANTUS) 100 unit/mL SOPN Inject 32 Units into the skin at bedtime.    . Melatonin 10 MG TABS Take 10 mg by mouth at bedtime as needed (sleep).    . metFORMIN (GLUCOPHAGE-XR) 500 MG 24 hr tablet Take 1,000 mg by mouth 3 (three) times daily with meals.    . metoprolol (LOPRESSOR) 50 MG tablet Take 50 mg by mouth at bedtime.     . metoprolol (TOPROL-XL) 100 MG 24 hr tablet Take 100 mg by mouth daily with breakfast.     . PRESCRIPTION MEDICATION Take 20 mg by mouth See admin instructions. Domperidone 10 mg from [place]: Take 2 tablets (20 mg) by mouth three times daily with meals - for diabetic gastroparesis    . telmisartan (MICARDIS) 80 MG tablet Take 80 mg by mouth at bedtime.     Marland Kitchen acetaZOLAMIDE (DIAMOX) 250 MG tablet Take 1 tablet (250 mg total) by mouth 3 (three) times daily. (Patient not taking: Reported on 05/31/2017) 90 tablet 12    Home: Home Living Family/patient expects to be discharged to:: Private residence Living Arrangements: Children, Spouse/significant other Available Help at Discharge: Family, Available 24 hours/day Type of Home: House Home Access: Stairs to enter [employer] of Steps: 4 Entrance Stairs-Rails: Right, Left, Can reach both Home Layout: One level Bathroom Shower/Tub: Multimedia programmer: Handicapped height Home Equipment: Grab bars - tub/shower, Grab bars - toilet, Shower seat, Radio producer - single point, Environmental consultant - 4 wheels  Lives With: Spouse   Functional History: Prior Function Level of Independence: Independent (intermittent use of rollator walker in the community ) Gait / Transfers Assistance Needed: occasionally uses rollator  ADL's / Homemaking Assistance Needed: Pt reports she completes ADLs independently approx 90-95% of the time; intermittently needs assistance/supervision  for bathing/dressing ADLs  Functional Status:  Mobility: Bed Mobility Overal bed mobility: Needs Assistance Bed Mobility: Rolling, Sidelying to Sit Rolling: Min assist Sidelying to sit: Min assist, +2 for safety/equipment Supine to sit: Min assist Sit to supine: Max assist General bed mobility comments: cues for sequence with segmental function of rolling, legs off then trunk up with min assist for legs and trunk with +2 for safety Transfers Overall transfer level: Needs assistance Equipment used: None Transfers: Sit to/from Stand, Google Transfers Sit to Stand: Max assist, +2 physical assistance Squat pivot transfers: Total assist, +2 physical  assistance General transfer comment: attempted standing x 3 with max assist for anterior translation and rise with pad, belt and knees blocked. RLE buckling with assist to maintain extension. total assist to move pelvis and pivot to chair Ambulation/Gait Ambulation/Gait assistance: Supervision Ambulation Distance (Feet): 210 Feet Assistive device: None Gait Pattern/deviations: Step-through pattern, Decreased stride length, Drifts right/left, Narrow base of support General Gait Details: Unable Gait velocity: decreased Gait velocity interpretation: Below normal speed for age/gender    ADL: ADL Overall ADL's : Needs assistance/impaired Eating/Feeding: NPO Grooming: Wash/dry face, Bed level, Set up Grooming Details (indicate cue type and reason): sink level Upper Body Bathing: Min guard, Sitting Lower Body Bathing: Min guard, Sit to/from stand Upper Body Dressing : Min guard, Sitting Lower Body Dressing: Total assistance, Bed level Lower Body Dressing Details (indicate cue type and reason): to don socks Toilet Transfer: Total assistance, +2 for physical assistance, Control and instrumentation engineer Details (indicate cue type and reason): Simulated by transfer EOB>chair Toileting- Clothing Manipulation and Hygiene: Modified independent, Sit  to/from stand Toileting - Clothing Manipulation Details (indicate cue type and reason): hospital gown and peri care Functional mobility during ADLs: Total assistance, +2 for physical assistance (squat pivot only) General ADL Comments: tolerated sitting EOB without support x 5 minutes, then asking to return to supine  Cognition: Cognition Overall Cognitive Status: Impaired/Different from baseline Arousal/Alertness: Awake/alert Orientation Level: Oriented X4 Awareness: Impaired Awareness Impairment: Intellectual impairment Problem Solving: Impaired Problem Solving Impairment: Functional basic, Verbal basic Behaviors: Poor frustration tolerance Cognition Arousal/Alertness: Awake/alert Behavior During Therapy: Flat affect Overall Cognitive Status: Impaired/Different from baseline Area of Impairment: Following commands, Safety/judgement, Problem solving Following Commands: Follows one step commands inconsistently, Follows one step commands with increased time Safety/Judgement: Decreased awareness of deficits Problem Solving: Slow processing, Decreased initiation, Requires verbal cues, Requires tactile cues, Difficulty sequencing General Comments: diffucult to understand speech at this time with PMSV  Difficult to assess due to: Tracheostomy   Blood pressure (!) 142/80, pulse 77, temperature 98.2 F (36.8 C), temperature source Oral, resp. rate 17, height _0  (1.626 m), weight 76.4 kg (168 lb 6.9 oz), SpO2 100 %. Physical Exam  Nursing note and vitals reviewed. Constitutional: She is oriented to person, place, and time. She appears well-developed and well-nourished. She appears lethargic. She is easily aroused. No distress.  HENT:  Head: Normocephalic and atraumatic.  Mouth/Throat: Oropharynx is clear and moist.  Eyes: Pupils are equal, round, and reactive to light. Conjunctivae and EOM are normal.  Neck: Normal range of motion.  CFS #6 in place with brownish secretions in flange.     Cardiovascular: Normal rate and regular rhythm.   Respiratory: Effort normal. No stridor. She has rhonchi.  GI: Soft. Bowel sounds are normal.  Musculoskeletal: She exhibits no edema or tenderness.  Neurological: She is oriented to person, place, and time and easily aroused. She appears lethargic.  Sleepy and kept falling asleep. Left facial droop with dysarthric speech. Able to phonate with PMSV. Needed max cues to recall DOB/age. Month  "April". Some difficulty with simple one step commands--question component of apraxia. Distracted with perseverative behaviors. She lacks insight or awareness of deficits. Right sided weakness with decrease in motor control. RUE grossly 2-3/5. RLE 3-4/5. Moves left side more spontaneously  Skin: Skin is warm and dry. She is not diaphoretic.  Psychiatric: Her affect is blunt. Her speech is delayed. She is slowed. Cognition and memory are impaired. She expresses inappropriate judgment. She is inattentive.    Results for orders placed  or performed during the hospital encounter of 05/31/17 (from the past 48 hour(s))  CBC     Status: Abnormal   Collection Time: 06/16/17 12:42 PM  Result Value Ref Range   WBC 10.4 4.0 - 10.5 K/uL   RBC 2.76 (L) 3.87 - 5.11 MIL/uL   Hemoglobin 7.6 (L) 12.0 - 15.0 g/dL   HCT 24.8 (L) 36.0 - 46.0 %   MCV 89.9 78.0 - 100.0 fL   MCH 27.5 26.0 - 34.0 pg   MCHC 30.6 30.0 - 36.0 g/dL   RDW 14.6 11.5 - 15.5 %   Platelets 412 (H) 150 - 400 K/uL  Basic metabolic panel     Status: Abnormal   Collection Time: 06/16/17 12:42 PM  Result Value Ref Range   Sodium 138 135 - 145 mmol/L   Potassium 4.1 3.5 - 5.1 mmol/L   Chloride 107 101 - 111 mmol/L   CO2 25 22 - 32 mmol/L   Glucose, Bld 270 (H) 65 - 99 mg/dL   BUN 22 (H) 6 - 20 mg/dL   Creatinine, Ser 0.79 0.44 - 1.00 mg/dL   Calcium 8.4 (L) 8.9 - 10.3 mg/dL   GFR calc non Af Amer >60 >60 mL/min   GFR calc Af Amer >60 >60 mL/min    Comment: (NOTE) The eGFR has been calculated using  the CKD EPI equation. This calculation has not been validated in all clinical situations. eGFR's persistently <60 mL/min signify possible Chronic Kidney Disease.    Anion gap 6 5 - 15  Type and screen [place]     Status: None   Collection Time: 06/16/17 12:42 PM  Result Value Ref Range   ABO/RH(D) A POS    Antibody Screen NEG    Sample Expiration 06/19/2017    Unit Number [id number]    Blood Component Type RBC, LR IRR    Unit division 00    Status of Unit ISSUED,FINAL    Transfusion Status OK TO TRANSFUSE    Crossmatch Result Compatible   Prepare RBC     Status: None   Collection Time: 06/16/17 12:42 PM  Result Value Ref Range   Order Confirmation ORDER PROCESSED BY BLOOD BANK   ABO/Rh     Status: None   Collection Time: 06/16/17 12:42 PM  Result Value Ref Range   ABO/RH(D) A POS   Glucose, capillary     Status: Abnormal   Collection Time: 06/16/17 12:52 PM  Result Value Ref Range   Glucose-Capillary 250 (H) 65 - 99 mg/dL  Glucose, capillary     Status: Abnormal   Collection Time: 06/16/17  3:32 PM  Result Value Ref Range   Glucose-Capillary 205 (H) 65 - 99 mg/dL  Glucose, capillary     Status: Abnormal   Collection Time: 06/16/17  7:43 PM  Result Value Ref Range   Glucose-Capillary 301 (H) 65 - 99 mg/dL   Comment 1 Notify RN    Comment 2 Document in Chart   Glucose, capillary     Status: Abnormal   Collection Time: 06/16/17  9:02 PM  Result Value Ref Range   Glucose-Capillary 209 (H) 65 - 99 mg/dL  Glucose, capillary     Status: Abnormal   Collection Time: 06/16/17 11:53 PM  Result Value Ref Range   Glucose-Capillary 176 (H) 65 - 99 mg/dL   Comment 1 Notify RN    Comment 2 Document in Chart   Glucose, capillary     Status: Abnormal  Collection Time: 06/17/17  4:14 AM  Result Value Ref Range   Glucose-Capillary 108 (H) 65 - 99 mg/dL  Glucose, capillary     Status: Abnormal   Collection Time: 06/17/17  8:04 AM  Result Value Ref Range    Glucose-Capillary 165 (H) 65 - 99 mg/dL  Glucose, capillary     Status: None   Collection Time: 06/17/17 12:06 PM  Result Value Ref Range   Glucose-Capillary 76 65 - 99 mg/dL  CBC     Status: Abnormal   Collection Time: 06/17/17 12:21 PM  Result Value Ref Range   WBC 11.5 (H) 4.0 - 10.5 K/uL   RBC 3.46 (L) 3.87 - 5.11 MIL/uL   Hemoglobin 9.4 (L) 12.0 - 15.0 g/dL   HCT 30.2 (L) 36.0 - 46.0 %   MCV 87.3 78.0 - 100.0 fL   MCH 27.2 26.0 - 34.0 pg   MCHC 31.1 30.0 - 36.0 g/dL   RDW 15.7 (H) 11.5 - 15.5 %   Platelets 401 (H) 150 - 400 K/uL  Basic metabolic panel     Status: Abnormal   Collection Time: 06/17/17 12:21 PM  Result Value Ref Range   Sodium 141 135 - 145 mmol/L   Potassium 4.0 3.5 - 5.1 mmol/L   Chloride 106 101 - 111 mmol/L   CO2 28 22 - 32 mmol/L   Glucose, Bld 75 65 - 99 mg/dL   BUN 18 6 - 20 mg/dL   Creatinine, Ser 0.65 0.44 - 1.00 mg/dL   Calcium 8.5 (L) 8.9 - 10.3 mg/dL   GFR calc non Af Amer >60 >60 mL/min   GFR calc Af Amer >60 >60 mL/min    Comment: (NOTE) The eGFR has been calculated using the CKD EPI equation. This calculation has not been validated in all clinical situations. eGFR's persistently <60 mL/min signify possible Chronic Kidney Disease.    Anion gap 7 5 - 15  Glucose, capillary     Status: Abnormal   Collection Time: 06/17/17  3:53 PM  Result Value Ref Range   Glucose-Capillary 293 (H) 65 - 99 mg/dL  Glucose, capillary     Status: Abnormal   Collection Time: 06/17/17  9:00 PM  Result Value Ref Range   Glucose-Capillary 169 (H) 65 - 99 mg/dL  Glucose, capillary     Status: Abnormal   Collection Time: 06/18/17 12:49 AM  Result Value Ref Range   Glucose-Capillary 134 (H) 65 - 99 mg/dL  Glucose, capillary     Status: Abnormal   Collection Time: 06/18/17  4:37 AM  Result Value Ref Range   Glucose-Capillary 147 (H) 65 - 99 mg/dL  CBC     Status: Abnormal   Collection Time: 06/18/17  4:59 AM  Result Value Ref Range   WBC 11.3 (H) 4.0 - 10.5  K/uL   RBC 3.39 (L) 3.87 - 5.11 MIL/uL   Hemoglobin 9.4 (L) 12.0 - 15.0 g/dL   HCT 30.3 (L) 36.0 - 46.0 %   MCV 89.4 78.0 - 100.0 fL   MCH 27.7 26.0 - 34.0 pg   MCHC 31.0 30.0 - 36.0 g/dL   RDW 15.2 11.5 - 15.5 %   Platelets 390 150 - 400 K/uL  Basic metabolic panel     Status: Abnormal   Collection Time: 06/18/17  4:59 AM  Result Value Ref Range   Sodium 140 135 - 145 mmol/L   Potassium 4.0 3.5 - 5.1 mmol/L   Chloride 106 101 - 111 mmol/L  CO2 27 22 - 32 mmol/L   Glucose, Bld 155 (H) 65 - 99 mg/dL   BUN 17 6 - 20 mg/dL   Creatinine, Ser 0.77 0.44 - 1.00 mg/dL   Calcium 8.7 (L) 8.9 - 10.3 mg/dL   GFR calc non Af Amer >60 >60 mL/min   GFR calc Af Amer >60 >60 mL/min    Comment: (NOTE) The eGFR has been calculated using the CKD EPI equation. This calculation has not been validated in all clinical situations. eGFR's persistently <60 mL/min signify possible Chronic Kidney Disease.    Anion gap 7 5 - 15  Glucose, capillary     Status: Abnormal   Collection Time: 06/18/17  7:41 AM  Result Value Ref Range   Glucose-Capillary 204 (H) 65 - 99 mg/dL  Glucose, capillary     Status: Abnormal   Collection Time: 06/18/17 12:13 PM  Result Value Ref Range   Glucose-Capillary 196 (H) 65 - 99 mg/dL   Dg Swallowing Func-speech Pathology  Result Date: 06/17/2017 Objective Swallowing Evaluation: Type of Study: MBS-Modified Barium Swallow Study Patient Details Name: [name] MRN: [id number] Date of Birth: 1951-05-10 Today's Date: 06/17/2017 Time: SLP Start Time (ACUTE ONLY): 1426-SLP Stop Time (ACUTE ONLY): 1454 SLP Time Calculation (min) (ACUTE ONLY): 28 min Past Medical History: Past Medical History: Diagnosis Date . Anemia   takes iron supplement . Anesthesia complication   disorientation due to pseudotumor . Diabetes mellitus   IDDM . Family history of anesthesia complication 73 yrs ago  brother stopped breathing for a minute or two . GERD (gastroesophageal reflux disease)  .  Headache(784.0)   due to pseudotumor; daily headache . Hypercholesteremia   unable to tolerate statins . Hypertension   under control with meds., has been on med. x 5 yr. . Peripheral neuropathy  . PONV (postoperative nausea and vomiting)  . 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 Seizures ([place])   due to shunt failure; last seizure 2007 . Synovitis of ankle 04/2013  left . Wears dentures   full Past Surgical History: Past Surgical History: Procedure Laterality Date . ANKLE ARTHROSCOPY Left 05/18/2013  Procedure: LEFT ANKLE ARTHROSCOPY WITH DEBRIDEMENT,  SUBTALAR OPEN DEBRIDEMENT ;  Surgeon: Wylene Simmer, MD;  Location: [facility];  Service: Orthopedics;  Laterality: Left; . APPENDECTOMY    as a child . BALLOON DILATION N/A 07/04/2013  Procedure: BALLOON DILATION;  Surgeon: Garlan Fair, MD;  Location: [facility];  Service: Endoscopy;  Laterality: N/A; . BLADDER SUSPENSION   . BREAST LUMPECTOMY W/ NEEDLE LOCALIZATION Left 05/22/2011 . CAROTID ANGIOGRAPHY Bilateral 06/03/2017  Procedure: Bilateral Carotid Angiography;  Surgeon: Conrad Hackett, MD;  Location: [facility];  Service: Cardiovascular;  Laterality: Bilateral; . COLONOSCOPY WITH PROPOFOL N/A 05/14/2014  Procedure: COLONOSCOPY WITH PROPOFOL;  Surgeon: Garlan Fair, MD;  Location: [facility];  Service: Endoscopy;  Laterality: N/A; . ESOPHAGEAL DILATION  06/23/2006; 08/05/2004 . ESOPHAGOGASTRODUODENOSCOPY N/A 07/04/2013  Procedure: ESOPHAGOGASTRODUODENOSCOPY (EGD);  Surgeon: Garlan Fair, MD;  Location: [facility];  Service: Endoscopy;  Laterality: N/A; . ESOPHAGOGASTRODUODENOSCOPY (EGD) WITH PROPOFOL N/A 05/14/2014  Procedure: ESOPHAGOGASTRODUODENOSCOPY (EGD) WITH PROPOFOL;  Surgeon: Garlan Fair, MD;  Location: [facility];  Service: Endoscopy;  Laterality: N/A; . IR ANGIO VERTEBRAL SEL SUBCLAVIAN INNOMINATE UNI R MOD SED  06/03/2017 . IR PERCUTANEOUS ART THROMBECTOMY/INFUSION INTRACRANIAL INC DIAG ANGIO  06/03/2017 .  IR PERCUTANEOUS ART THROMBECTOMY/INFUSION INTRACRANIAL INC DIAG ANGIO  06/03/2017 . LUMBAR PERITONEAL SHUNT    x 2 .  OVARIAN CYST REMOVAL    age 50 . RADIOLOGY WITH ANESTHESIA N/A 06/03/2017  Procedure: RADIOLOGY WITH ANESTHESIA;  Surgeon: Luanne Bras, MD;  Location: [facility];  Service: Radiology;  Laterality: N/A; . SHUNT REVISION  2007 . TOTAL ABDOMINAL HYSTERECTOMY W/ BILATERAL SALPINGOOPHORECTOMY  1994  age 34s . WRIST SURGERY Left 2014   [name] HPI: 66yo female with hx HTN, DM who presented initially 8/13 to her PCP with headache, R sided weakness and vision changes. She was sent for MRI which revealed multiple areas of acute L MCA infarct. She was sent to ER and admitted for CVA workup. On 8/16 pt was in cath lab for diagnostic carotid and cerebral angiogram with vascular surgery when she became suddenly unresponsive with L sided weakness and possible L sided seizure-like activity. CTA revealed bilateral M1 occlusions. She was intubated and taken urgently to IR for revascularization and rapid resolution of embolism. ETT 8/16-8/20, 8/20-8/23, trach 8/23, pt tolerated trach collar overnight. Subjective: pt alert, worried about feeling nauseous from barium Assessment / Plan / Recommendation CHL IP CLINICAL IMPRESSIONS 06/17/2017 Clinical Impression Pt has a moderate pharyngeal dysphagia due to sensorimotor deficits, including reduced hyolaryngeal movement and airway closure. Epiglottic deflection also seems to be impeded by presence of NGT. She has silent aspiration during the swallow that is reduced but not eliminated by use of chin tuck, even with purees. She does a good, concsistent job of tucking her chin and taking small bites, but even so she will have periodic silent aspiration. Therefore, I do not think she is quite ready for a PO diet, but I do think she could start therapeutic snacks with SLP consisting of Dys 1 and honey thick textures by spoon. Pt would also be a good candidate for respiratory  muscle training to increase strength of swallowing musculature and cough. Of note, pt did have several coughing episodes throughout study but they all seemed to be associated with her concern for regurgitation, NOT with airway compromise. SLP Visit Diagnosis Dysphagia, pharyngeal phase (R13.13) Attention and concentration deficit following -- Frontal lobe and executive function deficit following -- Impact on safety and function Moderate aspiration risk;Severe aspiration risk   [facility]L IP TREATMENT RECOMMENDATION 06/17/2017 Treatment Recommendations Therapy as outlined in treatment plan below   Prognosis 06/17/2017 Prognosis for Safe Diet Advancement Good Barriers to Reach Goals -- Barriers/Prognosis Comment -- [facility] IP DIET RECOMMENDATION 06/17/2017 SLP Diet Recommendations NPO;Alternative means - temporary Liquid Administration via -- Medication Administration Via alternative means Compensations -- Postural Changes --   [facility] IP OTHER RECOMMENDATIONS 06/17/2017 Recommended Consults -- Oral Care Recommendations Oral care QID Other Recommendations --   [facility] IP FOLLOW UP RECOMMENDATIONS 06/17/2017 Follow up Recommendations Inpatient Rehab   [facility] IP FREQUENCY AND DURATION 06/17/2017 Speech Therapy Frequency (ACUTE ONLY) min 3x week Treatment Duration 2 weeks      [facility] IP ORAL PHASE 06/17/2017 Oral Phase WFL Oral - Pudding Teaspoon -- Oral - Pudding Cup -- Oral - Honey Teaspoon -- Oral - Honey Cup -- Oral - Nectar Teaspoon -- Oral - Nectar Cup -- Oral - Nectar Straw -- Oral - Thin Teaspoon -- Oral - Thin Cup -- Oral - Thin Straw -- Oral - Puree -- Oral - Mech Soft -- Oral - Regular -- Oral - Multi-Consistency -- Oral - Pill -- Oral Phase - Comment --  [facility] IP PHARYNGEAL PHASE 06/17/2017 Pharyngeal Phase Impaired Pharyngeal- Pudding Teaspoon -- Pharyngeal -- Pharyngeal- Pudding Cup -- Pharyngeal -- Pharyngeal- Honey Teaspoon Delayed swallow initiation-vallecula;Reduced epiglottic  inversion;Reduced anterior laryngeal mobility;Reduced  laryngeal elevation;Reduced airway/laryngeal closure Pharyngeal -- Pharyngeal- Honey Cup -- Pharyngeal -- Pharyngeal- Nectar Teaspoon Delayed swallow initiation-vallecula;Reduced epiglottic inversion;Reduced anterior laryngeal mobility;Reduced laryngeal elevation;Reduced airway/laryngeal closure;Penetration/Aspiration during swallow Pharyngeal Material enters airway, passes BELOW cords without attempt by patient to eject out (silent aspiration) Pharyngeal- Nectar Cup -- Pharyngeal -- Pharyngeal- Nectar Straw -- Pharyngeal -- Pharyngeal- Thin Teaspoon -- Pharyngeal -- Pharyngeal- Thin Cup -- Pharyngeal -- Pharyngeal- Thin Straw -- Pharyngeal -- Pharyngeal- Puree Delayed swallow initiation-vallecula;Reduced epiglottic inversion;Reduced anterior laryngeal mobility;Reduced laryngeal elevation;Reduced airway/laryngeal closure;Penetration/Aspiration during swallow Pharyngeal Material enters airway, passes BELOW cords without attempt by patient to eject out (silent aspiration) Pharyngeal- Mechanical Soft -- Pharyngeal -- Pharyngeal- Regular -- Pharyngeal -- Pharyngeal- Multi-consistency -- Pharyngeal -- Pharyngeal- Pill -- Pharyngeal -- Pharyngeal Comment --  [facility] IP CERVICAL ESOPHAGEAL PHASE 06/17/2017 Cervical Esophageal Phase WFL Pudding Teaspoon -- Pudding Cup -- Honey Teaspoon -- Honey Cup -- Nectar Teaspoon -- Nectar Cup -- Nectar Straw -- Thin Teaspoon -- Thin Cup -- Thin Straw -- Puree -- Mechanical Soft -- Regular -- Multi-consistency -- Pill -- Cervical Esophageal Comment -- No flowsheet data found. [name] 06/17/2017, 4:05 PM  [name], M.A. CCC-SLP [phone]                 Medical Problem List and Plan: 1.  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  DVT Prophylaxis/Anticoagulation: Pharmaceutical: Lovenox 3. Headaches/Pain Management: Off IV fentanyl since  yesterday. May need topamax to help with constant HA.  4. Mood: LCSW to follow for evaluation and support.  5. Neuropsych: This patient is not capable of making decisions on her own behalf. 6. Skin/Wound Care: Routine pressure relief measures.  7. Fluids/Electrolytes/Nutrition: Monitor I/O. Check lytes in am.  8. MSSA PNA: Completed treatment 7 day course of cefazolin on 8/30.  Continue to monitor with strict aspiration precautions--HOB > 30 degrees at all times.   9. VDRF: Tolerating PMSV with supervision.  Secretions felt to be in part due to GERD. 10 HTN: Monitor BP bid--on lopressor bid and catapres,  11. ABLA: Improved with transfusion. Will recheck CBC in am.  12. Anxiety disorder:Team to provide ego support. Daughter staying to encourage patient. Palliative following for support and placed on IV  ativan tid for symptoms management--decrease to prn due to sedation.  13. T2DM: Hgb A1C-   Continue to titrated lantus insulin for tighter control.  Monitor BS ac/hs and use SSI for elevations.  14. GERD: on pepcid and domperitone 15. 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 Leucocytosis: persistent .Monitor for signs of infection.  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 B-MCA stenosis/L-ICA stenosis s/p revascularization: On ASA/Plavix.  7. Acute urinary retention: Continue foley for next few days to allow for acclimatization to unit. Then will    Post Admission Physician Evaluation: 1. Functional deficits secondary  to left CVA/debility. 2. Patient is admitted to receive collaborative, interdisciplinary care between the physiatrist, rehab nursing staff, and therapy team. 3. Patient's level of medical complexity and substantial therapy needs in context of that medical necessity cannot be provided at a lesser intensity of care such as a SNF. 4. Patient has experienced substantial functional loss from his/her baseline which was documented above under the "Functional History"  and "Functional Status" headings.  Judging by the patient's diagnosis, physical exam, and functional history, the patient has potential for functional progress which will result in measurable  gains while on inpatient rehab.  These gains will be of substantial and practical use upon discharge  in facilitating mobility and self-care at the household level. 5. Physiatrist will provide 24 hour management of medical needs as well as oversight of the therapy plan/treatment and provide guidance as appropriate regarding the interaction of the two. 6. The Preadmission Screening has been reviewed and patient status is unchanged unless otherwise stated above. 7. 24 hour rehab nursing will assist with bladder management, bowel management, safety, skin/wound care, disease management, medication administration, pain management and patient education  and help integrate therapy concepts, techniques,education, etc. 8. PT will assess and treat for/with: Lower extremity strength, range of motion, stamina, balance, functional mobility, safety, adaptive techniques and equipment, NMR, family education.   Goals are: min to mod assist. 9. OT will assess and treat for/with: ADL's, functional mobility, safety, upper extremity strength, adaptive techniques and equipment, NMR, family ed, ego support.   Goals are: min to mod assist. Therapy may not yet proceed with showering this patient. 10. SLP will assess and treat for/with: cognition, swallowing speech, family ed.  Goals are: mod assist. 11. Case Management and Social Worker will assess and treat for psychological issues and discharge planning. 12. Team conference will be held weekly to assess progress toward goals and to determine barriers to discharge. 13. Patient will receive at least 3 hours of therapy per day at least 5 days per week. 14. ELOS: 22-27 days       15. Prognosis:  good     Meredith Staggers, MD, [facility] 06/18/2017  Bary Leriche, Hershal Coria 06/18/2017

## 2017-06-17 NOTE — Progress Notes (Signed)
Patient ID: [name], female   DOB: 07-16-1951, 66 y.o.   MRN: [id number]  This NP visited patient at the bedside as a follow up to  yesterday's [place].  Daughter/[name] at bedside.  Continued conversation regarding seriousness and complexity of patient's medical situation. Patient and family remain open to all offered and available  medical interventions to prolong life.  Family is hopeful for CIR for rehabilitaion  Discussed with patient/family the importance of continued conversation with family and their  medical providers regarding overall plan of care and treatment options,  ensuring decisions are within the context of the patients values and GOCs.  Questions and concerns addressed   This NP will f/u on Monday and family to call team phone with needs over the week-end. # [phone]  Time in  9518      Time out    0805 Total time spent on the unit was 15 minutes  Greater than 50% of the time was spent in counseling and coordination of care  Wadie Lessen NP  Palliative Medicine Team Team Phone # [phone] Pager [phone]

## 2017-06-17 NOTE — Progress Notes (Signed)
Peripherally Inserted Central Catheter/Midline Placement  The IV Nurse has discussed with the patient and/or persons authorized to consent for the patient, the purpose of this procedure and the potential benefits and risks involved with this procedure.  The benefits include less needle sticks, lab draws from the catheter, and the patient may be discharged home with the catheter. Risks include, but not limited to, infection, bleeding, blood clot (thrombus formation), and puncture of an artery; nerve damage and irregular heartbeat and possibility to perform a PICC exchange if needed/ordered by physician.  Alternatives to this procedure were also discussed.  Bard Power PICC patient education guide, fact sheet on infection prevention and patient information card has been provided to patient /or left at bedside.    PICC/Midline Placement Documentation  PICC Double Lumen 19/37/90 PICC Right Basilic 36 cm 1 cm (Active)  Indication for Insertion or Continuance of Line Prolonged intravenous therapies 06/17/2017  9:40 AM  Exposed Catheter (cm) 1 cm 06/17/2017  9:40 AM  Site Assessment Clean;Dry;Intact 06/17/2017  9:40 AM  Lumen #1 Status Flushed;Saline locked;Blood return noted 06/17/2017  9:40 AM  Lumen #2 Status Flushed;Saline locked;Blood return noted 06/17/2017  9:40 AM  Dressing Type Transparent;Securing device 06/17/2017  9:40 AM  Dressing Status Clean;Dry;Intact;Antimicrobial disc in place 06/17/2017  9:40 AM  Dressing Change Due 06/24/17 06/17/2017  9:40 AM       Frances Maywood 06/17/2017, 9:42 AM

## 2017-06-17 NOTE — Care Management Note (Signed)
Case Management Note  Patient Details  Name: [name] MRN: [id number] Date of Birth: [birth year]  Subjective/Objective:   Bil MCA, acute thrombus s/p thrombectomy, acute hypoxic resp failure, acute encephalopathy , trach collar at 28%, downsized to cuffless yesterday, will start pmv trials today, once tolerating will start capping trials and move toward decannulation.  conts on ivf's , iv pain meds, plan for CIR. Discussed in LOS, appropriate for continue stay.                  Action/Plan: NCM will follow for dc needs.   Expected Discharge Date:                  Expected Discharge Plan:  [place]  In-House Referral:     Discharge planning Services  CM Consult  Post Acute Care Choice:    Choice offered to:     DME Arranged:    DME Agency:     HH Arranged:    [place] Agency:     Status of Service:  In process, will continue to follow  If discussed at Long Length of Stay Meetings, dates discussed:    Additional Comments:  Zenon Mayo, RN 06/17/2017, 2:18 PM

## 2017-06-17 NOTE — Progress Notes (Signed)
Modified Barium Swallow Progress Note  Patient Details  Name: [name] MRN: [id number] Date of Birth: 10-Aug-1951  Today's Date: 06/17/2017  Modified Barium Swallow completed.  Full report located under Chart Review in the Imaging Section.  Brief recommendations include the following:  Clinical Impression  Pt has a moderate pharyngeal dysphagia due to sensorimotor deficits, including reduced hyolaryngeal movement and airway closure. Epiglottic deflection also seems to be impeded by presence of NGT. She has silent aspiration during the swallow that is reduced but not eliminated by use of chin tuck, even with purees. She does a good, concsistent job of tucking her chin and taking small bites, but even so she will have periodic silent aspiration. Therefore, I do not think she is quite ready for a PO diet, but I do think she could start therapeutic snacks with SLP consisting of Dys 1 and honey thick textures by spoon. Pt would also be a good candidate for respiratory muscle training to increase strength of swallowing musculature and cough. Of note, pt did have several coughing episodes throughout study but they all seemed to be associated with her concern for regurgitation, NOT with airway compromise.   Swallow Evaluation Recommendations       SLP Diet Recommendations: NPO;Alternative means - temporary       Medication Administration: Via alternative means               Oral Care Recommendations: Oral care QID        [name] 06/17/2017,4:04 PM   [name], M.A. CCC-SLP [phone]

## 2017-06-17 NOTE — Progress Notes (Signed)
[facility] TEAM 1 - Stepdown/ICU TEAM  [name]  PIR:[id number] DOB: Aug 13, 1951 DOA: 05/31/2017 PCP: [name], MD    Brief Narrative:  66yo F Hx Pseudotumor Cerebri, Seizure, HTN, HLD, and DM2 with Neuropathy who developed HA, then weakness and numbness of the right hand, with poor vision of the R eye. Pt was seen by her MD and then had an MRI brain 8/13 which noted multiple small areas of acute infarct in the left parietal cortex and left occipital cortex. She was admitted to the hospital for evaluation.  8/16 pt was in cath lab for diagnostic carotid and cerebral angiogram with Vascular Surgery when she became suddenly unresponsive with L sided weakness and possible L sided seizure-like activity. CTA revealed bilateral M1 occlusions. She was intubated and taken urgently to IR for revascularization.    Subjective: Pt c/o generalized HA not responding to current pain meds.  No new neurologic deficits or sx.  Denies cp, sob, or N/V.    Assessment & Plan:  Acute embolic stroke - B MCA acute thrombosis  -Bilateral MCA occlusion, LICA stenosis S/P revascularization by IR -Aspirin + Plavix to continue as per Neuro   Altered mental status -Multifactorial acute ischemic stroke, MSSA bronchitis/pneumonia -Resolved  Acute respiratory failure with hypoxia / Vocal Cord Edema and stridor  -S/P tracheostomy 8/23 -Currently on trach collar tolerating well -Speech continue to work with patient increase PMV use. -PCCM change #6 trach cuff--> #6 trach cuffless -weaning as per PCCM who continues to follow   MSSA Pneumonia bilateral lower lobes -Completed 7+ days of antibiotic  Chronic diastolic CHF -no clinical evidence of signif volume overload at this time [facility] Weights   06/15/17 0420 06/16/17 0450 06/17/17 0500  Weight: 77.4 kg (170 lb 10.2 oz) 78 kg (171 lb 15.3 oz) 79.5 kg (175 lb 4.3 oz)    Essential HTN  BP not yet consistently at goal - adjust tx and follow    Diabetes type 2 uncontrolled with complications  -6/30 Z6W 9.5 - CBG quite variable - follow w/o change for now   Acute Urinary retention Cont foley - voiding trials when more mobile   Dysphagia MBS today suggests not yet ready for oral meals, but DID show improvement - will not yet pursue PEG and cont to work w/ SLP   Protein calorie malnutrition Cont temp nutrition support via NG Cortrak tube   DVT prophylaxis: lovenox Code Status: FULL CODE Family Communication: spoke w/ daugther at bedside  Disposition Plan:   Consultants:  PCCM Neurology - Stroke Team  Vascular Surgery   Procedures: 8/12 MR brain: -Multiple small areas of acute infarct in the left parietal cortex and left occipital cortex. - Numerous chronic white matter infarcts bilaterally 8/14 Echocardiogram: LVEF=: 60%to 65%. -Grade 1 diastolicdysfunction. 8/16 CTA head:-Right . Right M1 embolus with subtotal occlusion but severe stenosis.  -Elongated embolus left M1 and proximal M2 vessels. -Carotid bifurcation atherosclerosis with approximately 50-60% left ICA bulb narrowing. Visually the stenosis appears worse due to positive remodeling at the low-density plaque. 8/16 CT head-Several small foci of enhancement within bilateral parietal and left frontal cortex as well as left basal ganglia probably representing enhancing infarctions. 8/16 - sudden change in mental status, to IR for bilateral embolectomy for revascularization of bilat MCA occlusions  8/17 MRI Brain:-Numerous acute new supra- and infratentorial infarcts spanning multiple vascular territories consistent with embolic phenomena. Nohemorrhagic conversion  Antimicrobials:  Cefazolin 8/22 > 8/30  Objective: Blood pressure (!) 160/71, pulse 74, temperature  98.2 F (36.8 C), temperature source Oral, resp. rate 18, height 5' 4"  (1.626 m), weight 79.5 kg (175 lb 4.3 oz), SpO2 (!) 0 %.  Intake/Output Summary (Last 24 hours) at 06/17/17 1512 Last data  filed at 06/17/17 1007  Gross per 24 hour  Intake             3238 ml  Output             2605 ml  Net              633 ml   Filed Weights   06/15/17 0420 06/16/17 0450 06/17/17 0500  Weight: 77.4 kg (170 lb 10.2 oz) 78 kg (171 lb 15.3 oz) 79.5 kg (175 lb 4.3 oz)    Examination: General: No acute respiratory distress on vent via trach  Lungs: poor air movement B bases - no wheezing  Cardiovascular: Regular rate and rhythm without murmur gallop or rub normal S1 and S2 Abdomen: Nontender, nondistended, soft, bowel sounds positive, no rebound, no ascites, no appreciable mass Extremities: No significant cyanosis, clubbing, or edema bilateral lower extremities  CBC:  Recent Labs Lab 06/13/17 0250 06/14/17 0309 06/15/17 1854 06/16/17 1242 06/17/17 1221  WBC 12.5* 11.0* 10.4 10.4 11.5*  HGB 9.6* 8.1* 7.6* 7.6* 9.4*  HCT 30.5* 26.5* 25.0* 24.8* 30.2*  MCV 89.4 89.5 89.9 89.9 87.3  PLT 480* 405* 377 412* 962*   Basic Metabolic Panel:  Recent Labs Lab 06/13/17 0250 06/14/17 0309 06/15/17 1854 06/16/17 1242 06/17/17 1221  NA 144 141 138 138 141  K 4.1 3.0* 4.0 4.1 4.0  CL 105 105 106 107 106  CO2 28 27 23 25 28   GLUCOSE 67 78 248* 270* 75  BUN 36* 40* 33* 22* 18  CREATININE 0.82 0.88 0.82 0.79 0.65  CALCIUM 9.1 8.5* 8.1* 8.4* 8.5*  MG 2.2  --  1.8  --   --   PHOS 2.3*  --   --   --   --    GFR: Estimated Creatinine Clearance: 71.5 mL/min (by C-G formula based on SCr of 0.65 mg/dL).  Liver Function Tests: No results for input(s): AST, ALT, ALKPHOS, BILITOT, PROT, ALBUMIN in the last 168 hours. No results for input(s): LIPASE, AMYLASE in the last 168 hours. No results for input(s): AMMONIA in the last 168 hours.   HbA1C: Hgb A1c MFr Bld  Date/Time Value Ref Range Status  06/02/2017 07:41 AM 9.5 (H) 4.8 - 5.6 % Final    Comment:    (NOTE)         Prediabetes: 5.7 - 6.4         Diabetes: >6.4         Glycemic control for adults with diabetes: <7.0      CBG:  Recent Labs Lab 06/16/17 2102 06/16/17 2353 06/17/17 0414 06/17/17 0804 06/17/17 1206  GLUCAP 209* 176* 108* 165* 76    Scheduled Meds: . acetylcysteine  2 mL Nebulization Q6H WA  . aspirin  325 mg Per Tube Daily  . chlorhexidine gluconate (MEDLINE KIT)  15 mL Mouth Rinse BID  . Chlorhexidine Gluconate Cloth  6 each Topical Daily  . ciprofloxacin-dexamethasone  4 drop Right EAR BID  . cloNIDine  0.3 mg Transdermal Weekly  . clopidogrel  75 mg Per Tube Q breakfast  . docusate  100 mg Oral Daily  . Investigational - Study Medication  20 mg Per Tube TID  . enoxaparin (LOVENOX) injection  40 mg Subcutaneous Q24H  .  estropipate  0.75 mg Oral Daily  . feeding supplement (PRO-STAT SUGAR FREE 64)  30 mL Per Tube Daily  . insulin aspart  0-15 Units Subcutaneous Q4H  . insulin aspart  4 Units Subcutaneous Q4H  . insulin glargine  17 Units Subcutaneous Daily  . ipratropium-albuterol  3 mL Nebulization Q6H  . LORazepam  1 mg Intravenous Q8H  . mouth rinse  15 mL Mouth Rinse QID  . metoprolol tartrate  100 mg Per Tube BID  . sodium chloride flush  10-40 mL Intracatheter Q12H     LOS: 17 days   Cherene Altes, MD [facility] Office  [phone] Pager - Text Page per Shea Evans as per below:  On-Call/Text Page:      [email or web address]      password [email or web address]  If 7PM-7AM, please contact night-coverage [email or web address] Password [email or web address] 06/17/2017, 3:12 PM

## 2017-06-17 NOTE — Progress Notes (Addendum)
Inpatient Rehabilitation  I have received insurance authorization for IP Rehab.  Plan to follow for timing of medical readiness.  Note patient still receiving IV pain medications and has a rectal tube; these need to be addressed prior to IP Rehab.  Hopeful admission, tomorrow 06/18/17 pending medical readiness.  Patient asleep, reached out to spouse via phone, notified daughter via phone, and discussed with bedside team.  Please call with questions.  Carmelia Roller., CCC/SLP Admission Coordinator  [place]  Cell [phone]

## 2017-06-17 NOTE — Progress Notes (Signed)
PULMONARY / CRITICAL CARE MEDICINE   Name: [name] MRN: [id number] DOB: 02/17/1951    ADMISSION DATE:  05/31/2017 CONSULTATION DATE:  8/16  REFERRING MD:  Erlinda Hong   CHIEF COMPLAINT:  Vent management   HISTORY OF PRESENT ILLNESS:   66yo female with hx HTN, DM who presented initially 8/13 to her PCP with headache, R sided weakness and vision changes.  She was sent for MRI which revealed multiple areas of acute L MCA infarct.  She was sent to ER and admitted for CVA workup. On 8/16 pt was in cath lab for diagnostic carotid and cerebral angiogram with vascular surgery when she became suddenly unresponsive with L sided weakness and possible L sided seizure-like activity.  CTA revealed bilateral M1 occlusions.  She was intubated and taken urgently to IR for revascularization and rapid resolution of embolism.  PCCM consulted for ICU/vent management post procedure.   SUBJECTIVE:   Downsized to 6 cuffless yesterday.  Tol well.  Speech at bedside now working on [facility].  Pt doing well with this.   VITAL SIGNS: BP (!) 152/82 (BP Location: Left Arm)   Pulse 88   Temp 99.1 F (37.3 C) (Oral)   Resp 15   Ht 5\' 4"  (1.626 m)   Wt 79.5 kg (175 lb 4.3 oz)   SpO2 100%   BMI 30.08 kg/m   HEMODYNAMICS:    VENTILATOR SETTINGS: FiO2 (%):  [28 %] 28 %  INTAKE / OUTPUT:  Intake/Output Summary (Last 24 hours) at 06/17/17 1114 Last data filed at 06/17/17 1007  Gross per 24 hour  Intake             3238 ml  Output             2605 ml  Net              633 ml   General:  Chronically ill appearing female, in NAD with PMV HEENT: MM pink/moist, trach c/d Neuro: awake, alert, appropriate, MAE  CV: s1s2 rrr PULM: even/non-labored on ATC with PMV, good phonation, few scattered rhonchi  GI: round, soft, Nontender  Extremities: warm and dry, no edema  Skin: no rashes or lesions   LABS:  BMET  Recent Labs Lab 06/14/17 0309 06/15/17 1854 06/16/17 1242  NA 141 138 138  K 3.0* 4.0 4.1  CL  105 106 107  CO2 27 23 25   BUN 40* 33* 22*  CREATININE 0.88 0.82 0.79  GLUCOSE 78 248* 270*   Electrolytes  Recent Labs Lab 06/13/17 0250 06/14/17 0309 06/15/17 1854 06/16/17 1242  CALCIUM 9.1 8.5* 8.1* 8.4*  MG 2.2  --  1.8  --   PHOS 2.3*  --   --   --     CBC  Recent Labs Lab 06/14/17 0309 06/15/17 1854 06/16/17 1242  WBC 11.0* 10.4 10.4  HGB 8.1* 7.6* 7.6*  HCT 26.5* 25.0* 24.8*  PLT 405* 377 412*   Coag's No results for input(s): APTT, INR in the last 168 hours. Sepsis Markers No results for input(s): LATICACIDVEN, PROCALCITON, O2SATVEN in the last 168 hours.  ABG No results for input(s): PHART, PCO2ART, PO2ART in the last 168 hours. Liver Enzymes No results for input(s): AST, ALT, ALKPHOS, BILITOT, ALBUMIN in the last 168 hours.  Cardiac Enzymes No results for input(s): TROPONINI, PROBNP in the last 168 hours.  Glucose  Recent Labs Lab 06/16/17 1532 06/16/17 1943 06/16/17 2102 06/16/17 2353 06/17/17 0414 06/17/17 0804  GLUCAP 205* 301* 209* 176*  108* 165*   Imaging No results found.  STUDIES:  MR brain 8/12>>> Multiple small areas of acute infarct in the left parietal cortex and left occipital cortex. Recommend CTA head and neck to evaluate for source of emboli or stenosis causing these infarcts. Numerous chronic white matter infarcts bilaterally CTA head 8/16>>> 1. Right M1 embolus with subtotal occlusion but severe stenosis. The anterior temporal branch is covered and non-opacified. 2. Elongated embolus within the left M1 and proximal M2 vessels. 3. Negative for dissection or interval ulceration in the arch and cervical carotids. 4. Carotid bifurcation atherosclerosis with approximately 50-60% left ICA bulb narrowing. Visually the stenosis appears worse due to positive remodeling at the low-density plaque. 5. Moderate atheromatous narrowing of the right V4 and mid basilar. CT head 8/16>>> 1. Several small foci of enhancement within  bilateral parietal and left frontal cortex as well as left basal ganglia probably representing enhancing infarctions. 2. No large hemorrhage, focal mass effect, herniation, or Hydrocephalus. 2D echo 8/14>>>  - Left ventricle: The cavity size was normal. Systolic function was normal. The estimated ejection fraction was in the range of 60% to 65%. Wall motion was normal; there were no regional wall motion abnormalities. Doppler parameters are consistent with abnormal left ventricular relaxation (grade 1 diastolic dysfunction). MRI:  06/04/2017>>> Numerous acute new supra- and infratentorial infarcts spanning multiple vascular territories consistent with embolic phenomena. Nohemorrhagic conversion.Moderate chronic small vessel ischemic disease.  CULTURES: Tracheal aspirate 8/20 >> MSSA  ANTIBIOTICS: cefazolin 8/22 >>   SIGNIFICANT EVENTS: 8/16 - sudden change in mental status, to IR for bilateral embolectomy for revascularization of bilat MCA occlusions   LINES/TUBES: ETT 8/16>>>8/20>>>8/23 R rad aline 8/16>>>8/17 Trach 8/23>>>   ASSESSMENT / PLAN:  Bilateral acute MCA occlusion- s/p IR revascularization  Left ICA stenosis Acute respiratory failure - r/t AMS  Stridor, vocal cord edema Tracheostomy status  Hx HTN  Vomited 8/17 , Coretrack out 8/17  ABD film neg 8/17  Gastroparesis  Bilateral MCA occlusion  L ICA stenosis  Leukocytosis>> ? Reactive>resolved  MSSA bronchitis versus pneumonia DM - Hgb A1c >9   66yo female admitted with acute L MCA stroke, then acute R MCA occlusion during diagnostic arteriogram with AMS requiring intubation.  Taken to IR for revascularization. Reintubated on 8/20 for stridor. Left lower lobe atelectasis/infiltrate, MSSA on respiratory culture, abx started 8/22.    Pulmonary problem list  Tracheostomy dependence s/p acute CVA c/w post-intubation stridor, poor airway clearance and strep (beta-hemolytic & MSSA PNA) 8/25 to 8/27:  Seen by SLP  working on PMV Had what sounds like reflux +/- penetration after bedside swallow eval ->cont think that this is the biggest issue (gastroparesis/reflux).  Plan Cont reflux and aspiration precautions abx per primary  Cont rehab efforts Cont domperidone  Speech following - tolerating PMV  Plan for MBS today or in am  Ok for po diet once ok with speech  May be able to cap trach in next few days if she continues to do well with PMV and tolerates po's     Nickolas Madrid, NP 06/17/2017  11:14 AM Pager: [phone] or [phone]

## 2017-06-17 NOTE — Progress Notes (Signed)
  Speech Language Pathology Treatment: [name] Speaking valve  Patient Details Name: [name] MRN: [id number] DOB: [birth year] Today's Date: 06/17/2017 Time: 4315-4008 SLP Time Calculation (min) (ACUTE ONLY): 28 min  Assessment / Plan / Recommendation Clinical Impression  Pt wore the PMV for 20 minutes with no over difficulties. Her voice is soft and hoarse but she can produce better phonation with Mod cues for better breath support. She was responsive to questions with appropriate linguistic content. Recommend to wear PMV intermittently when full supervision can be provided. MBS also scheduled for this afternoon given improved PMV tolerance.   HPI HPI: 66yo female with hx HTN, DM who presented initially 8/13 to her PCP with headache, R sided weakness and vision changes. She was sent for MRI which revealed multiple areas of acute L MCA infarct. She was sent to ER and admitted for CVA workup. On 8/16 pt was in cath lab for diagnostic carotid and cerebral angiogram with vascular surgery when she became suddenly unresponsive with L sided weakness and possible L sided seizure-like activity. CTA revealed bilateral M1 occlusions. She was intubated and taken urgently to IR for revascularization and rapid resolution of embolism. ETT 8/16-8/20, 8/20-8/23, trach 8/23, pt tolerated trach collar overnight.      SLP Plan  MBS       Recommendations  Diet recommendations: NPO Medication Administration: Via alternative means      Patient may use Passy-Muir Speech Valve: Intermittently with supervision;During all therapies with supervision PMSV Supervision: Full         Oral Care Recommendations: Oral care QID Follow up Recommendations: Inpatient Rehab SLP Visit Diagnosis: Aphonia (R49.1) Plan: MBS       GO                [name] 06/17/2017, 12:15 PM  [name], M.A. CCC-SLP [phone]

## 2017-06-17 NOTE — PMR Pre-admission (Signed)
PMR Admission Coordinator Pre-Admission Assessment  Patient: [name] is an 66 y.o., female MRN: [id number] DOB: September 14, 1951 Height: 5' 4"  (162.6 cm) Weight: 76.4 kg (168 lb 6.9 oz)              Insurance Information HMO: X    PPO:      PCP:      IPA:      80/20:      OTHER:  PRIMARY: UHC Medicare       Policy#: [id number]      Subscriber: Self CM Name: Vevelyn Royals       Phone#: [phone]     Fax#: [phone] Pre-Cert#: [id number]      Employer: Disabled  Benefits:  Phone #: Verified online     Name: [employer]. Date: 10/19/16     Deduct: $0      Out of Pocket Max: [phone]      Life Max: N/A CIR: $430 a day, days 1-4; $0 a day, days 5+      SNF: $0 a day, days 1-20; $160 a day, days 21-62; $0 a day, days 63-100 Outpatient: PT/OT/SLP     Co-Pay: $40 per visit  Home Health: 100%      Co-Pay: None DME: 805     Co-Pay: 20% Providers: In-network  SECONDARY: None      Policy#:       Subscriber:  CM Name:       Phone#:      Fax#:  Pre-Cert#:       Employer:  Benefits:  Phone #:      Name:  Eff. Date:      Deduct:       Out of Pocket Max:       Life Max:  CIR:       SNF:  Outpatient:      Co-Pay:  Home Health:       Co-Pay:  DME:      Co-Pay:   Medicaid Application Date:       Case Manager:  Disability Application Date:       Case Worker:   Emergency Contact Information Contact Information    Name Relation Home Work Mobile   [place] Spouse [phone]  [phone]   Rolfson,Rebecca Daughter [phone]     Current Medical History  Patient Admitting Diagnosis: Bilateral strokes  History of Present Illness: Morgan C Holstonis a 66 y.o.femalewith history of DM, HTN, peripheral neuropathy, pseudotumor cerebri, seizure disorder, one week history of right hand weakness, and HA who had MRI brain done 05/30/17 revealing acute infarcts in left parietal cortex, white matter and occipital lobe. She was sent to the ED for full work up.  History taken from chart review. Carotid dopplers done revealing L-40-59% ICA stenosis. Cardiac echo revealed EF 60-65% with no wall abnormality and grade 1 diastolic dysfunction. CTA head/neck done revealing large non-calcified plaque in proximal L-ICA causing 60-65% stenosis and likely source of emboli.   She underwent cerebral angiogram for work up on 8/16 revealing <50% L-ICA stenosis and during the procedure became unresponsive with bilateral UE tonic posturing and left sided weakness. She was taken to IR for endovascualr revascularization of B-MCA occlusions with TICI reperfusion bilaterally. Dr. Erlinda Hong recommended DAPT x 3 months followed by Plavix alone. EEG revealed mild generalized dysfunction and intermittent BUE twitching not associated with epileptiform activity. Follow up MRI brain reviewed, showing multiple infarcts. Per report, numerous acute new supra and infratentorial infarcts involving B-MCA,  and bilateral PCA- no hemorrhage. She was briefly extubated on 06/07/17 but developed stridor with increase WOB requiring reintubation and beside tracheostomy on 06/10/17 by Dr. Nelda Marseille.   She was extubated to ATC and downsized to CFS #6 on 06/16/17. She is tolerating PMSV for 20 minutes with imporved phonation and breath support. She has had issues with copious secretions and mucomyst added. Tracheal secretion positive for MSSA and she was started on cefazolin 06/09/17 for treatment of MSSA bronchitis v/s PNA.  She did have drop in H/H to 7.6 and transfused with one unit PRBC on 06/16/17. MBS done 06/17/17 showing dysphagia due to sensorimotor deficits and silent aspiration during swallow and remains NPO but to start therapeutic trials with SLP. She is showing improvement in participation with encouragement and is tolerating activity at EOB as well as to chair transfers. CIR recommended due to functional decline in mobility and ability to carry out ADL tasks. Patient admitted to [facility] 06/18/17.   NIH Total: 2    Past Medical History  Past Medical History:  Diagnosis Date  . Anemia    takes iron supplement  . Anesthesia complication    disorientation due to pseudotumor  . Diabetes mellitus    IDDM  . Family history of anesthesia complication 33 yrs ago   brother stopped breathing for a minute or two  . GERD (gastroesophageal reflux disease)   . Headache(784.0)    due to pseudotumor; daily headache  . Hypercholesteremia    unable to tolerate statins  . Hypertension    under control with meds., has been on med. x 5 yr.  . Peripheral neuropathy   . PONV (postoperative nausea and vomiting)   . 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 Synovitis of ankle 04/2013   left  . Wears dentures    full    Family History  family history includes Heart disease in her father.  Prior Rehab/Hospitalizations:  Has the patient had major surgery during 100 days prior to admission? No  Current Medications   Current Facility-Administered Medications:  .  0.9 %  sodium chloride infusion, , Intravenous, Continuous, Cherene Altes, MD, Last Rate: 30 mL/hr at 06/18/17 0132 .  [DISCONTINUED] acetaminophen (TYLENOL) tablet 650 mg, 650 mg, Oral, Q4H PRN **OR** acetaminophen (TYLENOL) solution 650 mg, 650 mg, Per Tube, Q4H PRN, 650 mg at 06/15/17 1740 **OR** acetaminophen (TYLENOL) suppository 650 mg, 650 mg, Rectal, Q4H PRN, Deveshwar, Sanjeev, MD .  acetaZOLAMIDE (DIAMOX) tablet 250 mg, 250 mg, Per Tube, TID, Cherene Altes, MD, 250 mg at 06/18/17 1100 .  albuterol (PROVENTIL) (2.5 MG/3ML) 0.083% nebulizer solution 2.5 mg, 2.5 mg, Nebulization, Q4H PRN, Dhungel, Nishant, MD .  aspirin tablet 325 mg, 325 mg, Per Tube, Daily, Colvard, Tyler, RPH, 325 mg at 06/18/17 1100 .  chlorhexidine gluconate (MEDLINE KIT) (PERIDEX) 0.12 % solution 15 mL, 15 mL, Mouth Rinse, BID, Ashok Cordia, Sonia Baller,  MD, Stopped at 06/18/17 [phone] .  Chlorhexidine Gluconate Cloth 2 % PADS 6 each, 6 each, Topical, Daily, Cherene Altes, MD, 6 each at 06/18/17 1100 .  ciprofloxacin-dexamethasone (CIPRODEX) 0.3-0.1 % OTIC (EAR) suspension 4 drop, 4 drop, Right EAR, BID, Dhungel, Nishant, MD, 4 drop at 06/18/17 1100 .  cloNIDine (CATAPRES - Dosed in mg/24 hr) patch 0.3 mg, 0.3 mg, Transdermal, Weekly, Collene Gobble, MD, 0.3 mg at 06/16/17 1233 .  clopidogrel (  PLAVIX) tablet 75 mg, 75 mg, Per Tube, Q breakfast, Colvard, Tyler, RPH, 75 mg at 06/18/17 0837 .  diclofenac sodium (VOLTAREN) 1 % transdermal gel 2 g, 2 g, Topical, QID PRN, Jani Gravel, MD, 2 g at 06/01/17 1057 .  diphenhydrAMINE (BENADRYL) injection 25 mg, 25 mg, Intravenous, Q6H PRN, Opyd, Ilene Qua, MD, 25 mg at 06/18/17 0127 .  docusate (COLACE) 50 MG/5ML liquid 100 mg, 100 mg, Oral, Daily, Parrett, Tammy S, NP, 100 mg at 06/18/17 1100 .  Domperidone 20 mg (2 x 10 mg tablets) - patient's own supply, 20 mg, Per Tube, TID, Hammons, Kimberly B, RPH, 20 mg at 06/18/17 1100 .  enoxaparin (LOVENOX) injection 40 mg, 40 mg, Subcutaneous, Q24H, Conrad Leslie, MD, 40 mg at 06/18/17 0835 .  estropipate (OGEN) tablet 0.75 mg, 0.75 mg, Per Tube, Daily, Joette Catching T, MD, 0.75 mg at 06/18/17 1100 .  famotidine (PEPCID) IVPB 20 mg premix, 20 mg, Intravenous, Q24H, Javier Glazier, MD, Stopped at 06/17/17 1644 .  feeding supplement (PRO-STAT SUGAR FREE 64) liquid 30 mL, 30 mL, Per Tube, Daily, Allie Bossier, MD, 30 mL at 06/18/17 1100 .  feeding supplement (VITAL 1.5 CAL) liquid 1,000 mL, 1,000 mL, Per Tube, Continuous, Allie Bossier, MD, Last Rate: 50 mL/hr at 06/18/17 0138, 1,000 mL at 06/18/17 0138 .  ibuprofen (ADVIL,MOTRIN) 100 MG/5ML suspension 600 mg, 600 mg, Per Tube, Q8H PRN, Joette Catching T, MD .  insulin aspart (novoLOG) injection 0-15 Units, 0-15 Units, Subcutaneous, Q4H, Mannam, Praveen, MD, 4 Units at 06/18/17 [phone] .  insulin aspart  (novoLOG) injection 4 Units, 4 Units, Subcutaneous, Q4H, Erick Colace, NP, 4 Units at 06/18/17 [phone] .  insulin glargine (LANTUS) injection 17 Units, 17 Units, Subcutaneous, Daily, Allie Bossier, MD, 17 Units at 06/18/17 1100 .  labetalol (NORMODYNE,TRANDATE) injection 10 mg, 10 mg, Intravenous, Q10 min PRN, Patteson, Samuel A, NP, 10 mg at 06/07/17 2140 .  LORazepam (ATIVAN) injection 1 mg, 1 mg, Intravenous, Q8H, Knox Royalty, NP, 1 mg at 06/18/17 [phone] .  MEDLINE mouth rinse, 15 mL, Mouth Rinse, QID, Ashok Cordia Sonia Baller, MD, 15 mL at 06/17/17 1848 .  metoprolol tartrate (LOPRESSOR) tablet 100 mg, 100 mg, Per Tube, BID, Collene Gobble, MD, 100 mg at 06/18/17 1100 .  morphine 4 MG/ML injection 1-2 mg, 1-2 mg, Intravenous, Q3H PRN, Cherene Altes, MD .  ondansetron [facility]) injection 4 mg, 4 mg, Intravenous, Q6H PRN, Dhungel, Nishant, MD, 4 mg at 06/14/17 1220 .  pantoprazole (PROTONIX) EC tablet 40 mg, 40 mg, Oral, Daily PRN, Jani Gravel, MD .  phenol [facility]) mouth spray 1 spray, 1 spray, Mouth/Throat, PRN, Allie Bossier, MD, 1 spray at 06/13/17 2312 .  sodium chloride flush (NS) 0.9 % injection 10-40 mL, 10-40 mL, Intracatheter, Q12H, Cherene Altes, MD, 10 mL at 06/17/17 2355 .  sodium chloride flush (NS) 0.9 % injection 10-40 mL, 10-40 mL, Intracatheter, PRN, Cherene Altes, MD .  traMADol Veat[facility] Bourbon) tablet 50 mg, 50 mg, Per Tube, Q6H PRN, Cherene Altes, MD, 50 mg at 06/18/17 0127  Patients Current Diet: Diet NPO time specified  Precautions / Restrictions Precautions Precautions: Fall Precaution Comments: cortrak, trach Restrictions Weight Bearing Restrictions: No   Has the patient had 2 or more falls or a fall with injury in the past year?Yes, 3 falls in August  Prior Activity Level Community (5-7x/wk): Prior to admission patient was retired/disabled and did not drive.  However, she  was fully independent with self-care, household, and money management  tasks.    Home Assistive Devices / Equipment Home Assistive Devices/Equipment: None Home Equipment: Grab bars - tub/shower, Grab bars - toilet, Shower seat, Cane - single point, Walker - 4 wheels  Prior Device Use: Indicate devices/aids used by the patient prior to current illness, exacerbation or injury? Intermittent use of Rollator walker out in the community  Prior Functional Level Prior Function Level of Independence: Independent (intermittent use of rollator walker in the community ) Gait / Transfers Assistance Needed: occasionally uses rollator  ADL's / Homemaking Assistance Needed: Pt reports she completes ADLs independently approx 90-95% of the time; intermittently needs assistance/supervision for bathing/dressing ADLs  Self Care: Did the patient need help bathing, dressing, using the toilet or eating? Independent  Indoor Mobility: Did the patient need assistance with walking from room to room (with or without device)? Independent  Stairs: Did the patient need assistance with internal or external stairs (with or without device)? Independent  Functional Cognition: Did the patient need help planning regular tasks such as shopping or remembering to take medications? Independent  Current Functional Level Cognition  Arousal/Alertness: Awake/alert Overall Cognitive Status: Impaired/Different from baseline Difficult to assess due to: Tracheostomy Orientation Level: Oriented X4 Following Commands: Follows one step commands inconsistently, Follows one step commands with increased time Safety/Judgement: Decreased awareness of deficits General Comments: difficult to understand speech at this time with PMSV  Awareness: Impaired Awareness Impairment: Intellectual impairment Problem Solving: Impaired Problem Solving Impairment: Functional basic, Verbal basic Behaviors: Poor frustration tolerance    Extremity Assessment (includes Sensation/Coordination)  Upper Extremity Assessment:  RUE deficits/detail, LUE deficits/detail RUE Deficits / Details: Pt self limiting AROM in RUE due to pain at IV site with increased movement, shoulder abduction appears approx 3+/5, decreased grip strength; sensation appears intact to light touch RUE Coordination: decreased fine motor, decreased gross motor LUE Deficits / Details: LUE shoulder abduction approx 4/5, decreased AROM in shoulder flexion however is [facility]   Lower Extremity Assessment: Defer to PT evaluation    ADLs  Overall ADL's : Needs assistance/impaired Eating/Feeding: NPO Grooming: Wash/dry hands, Wash/dry face, Oral care, Sitting, Minimal assistance Grooming Details (indicate cue type and reason): in recliner, suction oral care kit used to prevent aspiration; Pt uing LUE as primary - encouraging use of RUE throughout Upper Body Bathing: Min guard, Sitting Lower Body Bathing: Min guard, Sit to/from stand Upper Body Dressing : Min guard, Sitting Lower Body Dressing: Total assistance, Bed level Lower Body Dressing Details (indicate cue type and reason): to don socks Toilet Transfer: Total assistance, +2 for physical assistance, Control and instrumentation engineer Details (indicate cue type and reason): Simulated by transfer EOB>chair Toileting- Clothing Manipulation and Hygiene: Modified independent, Sit to/from stand Toileting - Clothing Manipulation Details (indicate cue type and reason): hospital gown and peri care Functional mobility during ADLs: Total assistance, +2 for physical assistance (squat pivot only) General ADL Comments: use of PSMV during session     Mobility  Overal bed mobility: Needs Assistance Bed Mobility: Rolling, Sidelying to Sit Rolling: Min assist Sidelying to sit: Min assist, +2 for safety/equipment Supine to sit: Min assist Sit to supine: Max assist General bed mobility comments: cues for sequence with segmental function of rolling, legs off then trunk up with min assist for legs and trunk with +2 for  safety    Transfers  Overall transfer level: Needs assistance Equipment used: None Transfers: Sit to/from Stand, Google Transfers Sit to Stand: Max assist, +2 physical  assistance Squat pivot transfers: Total assist, +2 physical assistance General transfer comment: attempted standing x 3 with max assist for anterior translation and rise with pad, belt and knees blocked. RLE buckling with assist to maintain extension. total assist to move pelvis and pivot to chair    Ambulation / Gait / Stairs / Wheelchair Mobility  Ambulation/Gait Ambulation/Gait assistance: Supervision Ambulation Distance (Feet): 210 Feet Assistive device: None Gait Pattern/deviations: Step-through pattern, Decreased stride length, Drifts right/left, Narrow base of support General Gait Details: Unable Gait velocity: decreased Gait velocity interpretation: Below normal speed for age/gender    Posture / Balance Dynamic Sitting Balance Sitting balance - Comments: pt EOB 12 min with varied min to minguard. With assist for hand placement and verbal cues for position pt able to maintain static sitting grossly 30 sec at a time Balance Overall balance assessment: Needs assistance Sitting-balance support: Feet supported, Bilateral upper extremity supported Sitting balance-Leahy Scale: Poor Sitting balance - Comments: pt EOB 12 min with varied min to minguard. With assist for hand placement and verbal cues for position pt able to maintain static sitting grossly 30 sec at a time Postural control: Posterior lean, Right lateral lean Standing balance support: Bilateral upper extremity supported Standing balance-Leahy Scale: Zero Standing balance comment: knees blocked, pt maintains flexed posture  High level balance activites: Backward walking, Direction changes, Turns, Head turns High Level Balance Comments: supervision this session    Special needs/care consideration BiPAP/CPAP: No CPM: No Continuous Drip IV:  No Dialysis: No         Life Vest: No Oxygen: Yes Special Bed: ICU bed at this time  Trach Size: #6 cuffless Shiley with PMSV with SLP  Wound Vac (area): No       Skin: Moisture associated skin break down perineum, buttocks, Bruising bilateral arms and abdomen                             Bowel mgmt: Rectal tube as of 06/17/17, now removed  Bladder mgmt: Foley as of 06/18/17 due to acute urinary retention and malodorous output per chart review Diabetic mgmt: Yes that she managed with oral medication and insulin prior to admission.      Previous Home Environment Living Arrangements: Children, Spouse/significant other  Lives With: Spouse Available Help at Discharge: Family, Available 24 hours/day Type of Home: House Home Layout: One level Home Access: Stairs to enter Entrance Stairs-Rails: Right, Left, Can reach both Entrance Stairs-Number of Steps: 4 Bathroom Shower/Tub: Multimedia programmer: Handicapped height Home Care Services: No  Discharge Living Setting Plans for Discharge Living Setting: Patient's home, Lives with (comment) (Spouse and daughter ) Type of Home at Discharge: House Discharge Home Layout: One level Discharge Home Access: Stairs to enter Entrance Stairs-Rails: Right, Left, Can reach both Entrance Stairs-Number of Steps: 4-5 Discharge Bathroom Shower/Tub: Tub/shower unit, Walk-in shower (they have both ) Discharge Bathroom Toilet: Handicapped height Discharge Bathroom Accessibility: Yes How Accessible: Accessible via wheelchair, Accessible via walker Does the patient have any problems obtaining your medications?: No  Social/Family/Support Systems Patient Roles: Spouse, Parent Contact Information: Spouse: Kapri Nero Cell: [phone]; Daughter: Joceline Hinchcliff Cell: [phone] Anticipated Caregiver: Spouse, daughter, and son Anticipated Caregiver's Contact Information: see above  Ability/Limitations of Caregiver: They work but are aware of  anticaipted need for 24/7  Caregiver Availability: Intermittent Discharge Plan Discussed with Primary Caregiver: Yes Is Caregiver In Agreement with Plan?: Yes Does Caregiver/Family have Issues with Lodging/Transportation while Pt  is in Rehab?: No  Goals/Additional Needs Patient/Family Goal for Rehab: PT/OT/SLP Mod A Expected length of stay: 22-27 days  Cultural Considerations: non-practicing Catholic  Dietary Needs: NPO Equipment Needs: TBD Special Service Needs: None Additional Information: N/A Pt/Family Agrees to Admission and willing to participate: Yes Program Orientation Provided & Reviewed with Pt/Caregiver Including Roles  & Responsibilities: Yes Additional Information Needs: N/A  Barriers to Discharge: Nutrition means, Incontinence, Trach, Decreased caregiver support  Decrease burden of Care through IP rehab admission: No  Possible need for SNF placement upon discharge: Not anticipated   Patient Condition: This patient's medical and functional status has changed since the consult dated: 06/14/17 in which the Rehabilitation Physician determined and documented that the patient's condition is appropriate for intensive rehabilitative care in an inpatient rehabilitation facility. See "History of Present Illness" (above) for medical update. Functional changes are: Max-Total A +2 transfers. Patient's medical and functional status update has been discussed with the Rehabilitation physician and patient remains appropriate for inpatient rehabilitation. Will admit to inpatient rehab today.  Preadmission Screen Completed By:  Gunnar Fusi, 06/18/2017 12:45 PM ______________________________________________________________________   Discussed status with Dr. Naaman Plummer on 06/18/17 at 1250 and received telephone approval for admission today.  Admission Coordinator:  Gunnar Fusi, time 1250/Date 06/18/17

## 2017-06-18 ENCOUNTER — Inpatient Hospital Stay (HOSPITAL_COMMUNITY)
Admission: RE | Admit: 2017-06-18 | Discharge: 2017-07-08 | DRG: 057 | Disposition: A | Discharge status: Home Health | Payer: Medicare Other | Source: Intra-hospital | Attending: Physical Medicine & Rehabilitation | Admitting: Physical Medicine & Rehabilitation

## 2017-06-18 ENCOUNTER — Encounter (HOSPITAL_COMMUNITY): Payer: Self-pay | Admitting: *Deleted

## 2017-06-18 DIAGNOSIS — Z880 Allergy status to penicillin: Secondary | ICD-10-CM

## 2017-06-18 DIAGNOSIS — R531 Weakness: Secondary | ICD-10-CM

## 2017-06-18 DIAGNOSIS — E0800 Diabetes mellitus due to underlying condition with hyperosmolarity without nonketotic hyperglycemic-hyperosmolar coma (NKHHC): Secondary | ICD-10-CM | POA: Diagnosis not present

## 2017-06-18 DIAGNOSIS — I639 Cerebral infarction, unspecified: Secondary | ICD-10-CM | POA: Diagnosis not present

## 2017-06-18 DIAGNOSIS — R042 Hemoptysis: Secondary | ICD-10-CM | POA: Diagnosis not present

## 2017-06-18 DIAGNOSIS — Z930 Tracheostomy status: Secondary | ICD-10-CM | POA: Diagnosis not present

## 2017-06-18 DIAGNOSIS — G40909 Epilepsy, unspecified, not intractable, without status epilepticus: Secondary | ICD-10-CM | POA: Diagnosis present

## 2017-06-18 DIAGNOSIS — I10 Essential (primary) hypertension: Secondary | ICD-10-CM | POA: Diagnosis present

## 2017-06-18 DIAGNOSIS — I69391 Dysphagia following cerebral infarction: Secondary | ICD-10-CM

## 2017-06-18 DIAGNOSIS — Z794 Long term (current) use of insulin: Secondary | ICD-10-CM | POA: Diagnosis not present

## 2017-06-18 DIAGNOSIS — I6931 Attention and concentration deficit following cerebral infarction: Secondary | ICD-10-CM

## 2017-06-18 DIAGNOSIS — E1143 Type 2 diabetes mellitus with diabetic autonomic (poly)neuropathy: Secondary | ICD-10-CM

## 2017-06-18 DIAGNOSIS — Z79899 Other long term (current) drug therapy: Secondary | ICD-10-CM

## 2017-06-18 DIAGNOSIS — E785 Hyperlipidemia, unspecified: Secondary | ICD-10-CM | POA: Diagnosis present

## 2017-06-18 DIAGNOSIS — I69311 Memory deficit following cerebral infarction: Secondary | ICD-10-CM | POA: Diagnosis not present

## 2017-06-18 DIAGNOSIS — D649 Anemia, unspecified: Secondary | ICD-10-CM | POA: Diagnosis present

## 2017-06-18 DIAGNOSIS — K219 Gastro-esophageal reflux disease without esophagitis: Secondary | ICD-10-CM | POA: Diagnosis present

## 2017-06-18 DIAGNOSIS — R1312 Dysphagia, oropharyngeal phase: Secondary | ICD-10-CM | POA: Diagnosis present

## 2017-06-18 DIAGNOSIS — I69314 Frontal lobe and executive function deficit following cerebral infarction: Secondary | ICD-10-CM | POA: Diagnosis not present

## 2017-06-18 DIAGNOSIS — E119 Type 2 diabetes mellitus without complications: Secondary | ICD-10-CM

## 2017-06-18 DIAGNOSIS — R140 Abdominal distension (gaseous): Secondary | ICD-10-CM | POA: Diagnosis not present

## 2017-06-18 DIAGNOSIS — E1142 Type 2 diabetes mellitus with diabetic polyneuropathy: Secondary | ICD-10-CM | POA: Diagnosis present

## 2017-06-18 DIAGNOSIS — Z7982 Long term (current) use of aspirin: Secondary | ICD-10-CM | POA: Diagnosis not present

## 2017-06-18 DIAGNOSIS — Z888 Allergy status to other drugs, medicaments and biological substances status: Secondary | ICD-10-CM | POA: Diagnosis not present

## 2017-06-18 DIAGNOSIS — H53461 Homonymous bilateral field defects, right side: Secondary | ICD-10-CM | POA: Diagnosis not present

## 2017-06-18 DIAGNOSIS — G441 Vascular headache, not elsewhere classified: Secondary | ICD-10-CM

## 2017-06-18 DIAGNOSIS — K920 Hematemesis: Secondary | ICD-10-CM | POA: Diagnosis not present

## 2017-06-18 DIAGNOSIS — I69351 Hemiplegia and hemiparesis following cerebral infarction affecting right dominant side: Principal | ICD-10-CM

## 2017-06-18 DIAGNOSIS — G8191 Hemiplegia, unspecified affecting right dominant side: Secondary | ICD-10-CM

## 2017-06-18 DIAGNOSIS — K3184 Gastroparesis: Secondary | ICD-10-CM | POA: Diagnosis present

## 2017-06-18 DIAGNOSIS — I6300 Cerebral infarction due to thrombosis of unspecified precerebral artery: Secondary | ICD-10-CM | POA: Diagnosis not present

## 2017-06-18 DIAGNOSIS — F4323 Adjustment disorder with mixed anxiety and depressed mood: Secondary | ICD-10-CM | POA: Diagnosis not present

## 2017-06-18 DIAGNOSIS — I63132 Cerebral infarction due to embolism of left carotid artery: Secondary | ICD-10-CM | POA: Diagnosis not present

## 2017-06-18 DIAGNOSIS — R112 Nausea with vomiting, unspecified: Secondary | ICD-10-CM | POA: Diagnosis not present

## 2017-06-18 DIAGNOSIS — N390 Urinary tract infection, site not specified: Secondary | ICD-10-CM

## 2017-06-18 DIAGNOSIS — R1310 Dysphagia, unspecified: Secondary | ICD-10-CM

## 2017-06-18 LAB — BASIC METABOLIC PANEL
ANION GAP: 7 (ref 5–15)
BUN: 17 mg/dL (ref 6–20)
CALCIUM: 8.7 mg/dL — AB (ref 8.9–10.3)
CO2: 27 mmol/L (ref 22–32)
Chloride: 106 mmol/L (ref 101–111)
Creatinine, Ser: 0.77 mg/dL (ref 0.44–1.00)
GFR calc Af Amer: >60 mL/min (ref >60)
GLUCOSE: 155 mg/dL — AB (ref 65–99)
Potassium: 4 mmol/L (ref 3.5–5.1)
Sodium: 140 mmol/L (ref 135–145)

## 2017-06-18 LAB — CBC
HCT: 30.3 % — ABNORMAL LOW (ref 36.0–46.0)
Hemoglobin: 9.4 g/dL — ABNORMAL LOW (ref 12.0–15.0)
MCH: 27.7 pg (ref 26.0–34.0)
MCHC: 31 g/dL (ref 30.0–36.0)
MCV: 89.4 fL (ref 78.0–100.0)
PLATELETS: 390 10*3/uL (ref 150–400)
RBC: 3.39 MIL/uL — ABNORMAL LOW (ref 3.87–5.11)
RDW: 15.2 % (ref 11.5–15.5)
WBC: 11.3 10*3/uL — AB (ref 4.0–10.5)

## 2017-06-18 LAB — GLUCOSE, CAPILLARY
GLUCOSE-CAPILLARY: 204 mg/dL — AB (ref 65–99)
GLUCOSE-CAPILLARY: 145 mg/dL — AB (ref 65–99)
Glucose-Capillary: 134 mg/dL — ABNORMAL HIGH (ref 65–99)
Glucose-Capillary: 147 mg/dL — ABNORMAL HIGH (ref 65–99)
Glucose-Capillary: 196 mg/dL — ABNORMAL HIGH (ref 65–99)
Glucose-Capillary: 190 mg/dL — ABNORMAL HIGH (ref 65–99)

## 2017-06-18 MED ORDER — ALUM & MAG HYDROXIDE-SIMETH 200-200-20 MG/5ML PO SUSP
30.0000 mL | ORAL | Status: DC | PRN
  Filled 2017-06-18 (×2): qty 30

## 2017-06-18 MED ORDER — ALBUTEROL SULFATE (2.5 MG/3ML) 0.083% IN NEBU: 2.5000 mg | INHALATION_SOLUTION | RESPIRATORY_TRACT | Status: DC | PRN

## 2017-06-18 MED ORDER — FAMOTIDINE 40 MG/5ML PO SUSR
20.0000 mg | Freq: Every day | ORAL | Status: DC
  Administered 2017-06-19 – 2017-06-29 (×10): 20 mg
  Filled 2017-06-18 (×12): qty 2.5

## 2017-06-18 MED ORDER — STUDY - INVESTIGATIONAL MEDICATION
20.0000 mg | Freq: Three times a day (TID) | Status: DC
  Administered 2017-06-19 – 2017-07-06 (×51): 20 mg
  Filled 2017-06-18 (×53): qty 1

## 2017-06-18 MED ORDER — ESTROPIPATE 0.75 MG PO TABS
0.7500 mg | ORAL_TABLET | Freq: Every day | ORAL | Status: DC
  Administered 2017-06-19 – 2017-07-08 (×18): 0.75 mg
  Filled 2017-06-18 (×20): qty 1

## 2017-06-18 MED ORDER — ORAL CARE MOUTH RINSE
15.0000 mL | Freq: Four times a day (QID) | OROMUCOSAL | Status: DC
  Administered 2017-06-19 – 2017-07-04 (×32): 15 mL via OROMUCOSAL

## 2017-06-18 MED ORDER — PROCHLORPERAZINE 25 MG RE SUPP: 12.5000 mg | Freq: Four times a day (QID) | RECTAL | Status: DC | PRN

## 2017-06-18 MED ORDER — INSULIN GLARGINE 100 UNIT/ML ~~LOC~~ SOLN
17.0000 [IU] | Freq: Every day | SUBCUTANEOUS | Status: DC
  Administered 2017-06-19 – 2017-06-21 (×3): 17 [IU] via SUBCUTANEOUS
  Filled 2017-06-18 (×4): qty 0.17

## 2017-06-18 MED ORDER — CHLORHEXIDINE GLUCONATE CLOTH 2 % EX PADS
6.0000 | MEDICATED_PAD | Freq: Every day | CUTANEOUS | Status: DC
  Administered 2017-06-19 – 2017-06-21 (×3): 6 via TOPICAL

## 2017-06-18 MED ORDER — CLONIDINE HCL 0.3 MG/24HR TD PTWK
0.3000 mg | MEDICATED_PATCH | TRANSDERMAL | Status: DC
  Administered 2017-06-23 – 2017-06-30 (×2): 0.3 mg via TRANSDERMAL
  Filled 2017-06-18 (×2): qty 1

## 2017-06-18 MED ORDER — ENOXAPARIN SODIUM 40 MG/0.4ML ~~LOC~~ SOLN
40.0000 mg | SUBCUTANEOUS | Status: DC
  Administered 2017-06-19 – 2017-06-23 (×5): 40 mg via SUBCUTANEOUS
  Filled 2017-06-18 (×6): qty 0.4

## 2017-06-18 MED ORDER — FLEET ENEMA 7-19 GM/118ML RE ENEM
1.0000 | ENEMA | Freq: Once | RECTAL | Status: AC | PRN
  Administered 2017-06-21: 1 via RECTAL
  Filled 2017-06-18: qty 1

## 2017-06-18 MED ORDER — DICLOFENAC SODIUM 1 % TD GEL
2.0000 g | Freq: Four times a day (QID) | TRANSDERMAL | Status: DC | PRN
  Filled 2017-06-18: qty 100

## 2017-06-18 MED ORDER — GUAIFENESIN-DM 100-10 MG/5ML PO SYRP
5.0000 mL | ORAL_SOLUTION | Freq: Four times a day (QID) | ORAL | Status: DC | PRN
  Administered 2017-06-23: 10 mL
  Filled 2017-06-18: qty 10

## 2017-06-18 MED ORDER — DIPHENHYDRAMINE HCL 12.5 MG/5ML PO ELIX: 12.5000 mg | ORAL_SOLUTION | Freq: Four times a day (QID) | ORAL | Status: DC | PRN

## 2017-06-18 MED ORDER — BISACODYL 10 MG RE SUPP: 10.0000 mg | Freq: Every day | RECTAL | Status: DC | PRN

## 2017-06-18 MED ORDER — PROCHLORPERAZINE MALEATE 5 MG PO TABS
5.0000 mg | ORAL_TABLET | Freq: Four times a day (QID) | ORAL | Status: DC | PRN
  Administered 2017-06-22: 5 mg
  Administered 2017-06-23 (×2): 10 mg
  Administered 2017-06-27: 5 mg
  Filled 2017-06-18: qty 1
  Filled 2017-06-18: qty 2
  Filled 2017-06-18: qty 1
  Filled 2017-06-18 (×2): qty 2
  Filled 2017-06-18: qty 1

## 2017-06-18 MED ORDER — FAMOTIDINE 40 MG/5ML PO SUSR
20.0000 mg | Freq: Every day | ORAL | Status: DC
  Administered 2017-06-18: 20 mg
  Filled 2017-06-18: qty 2.5

## 2017-06-18 MED ORDER — LORAZEPAM 2 MG/ML IJ SOLN
0.5000 mg | Freq: Three times a day (TID) | INTRAMUSCULAR | Status: DC | PRN
  Administered 2017-06-19 – 2017-06-25 (×3): 0.5 mg via INTRAVENOUS
  Filled 2017-06-18 (×4): qty 1

## 2017-06-18 MED ORDER — FREE WATER
200.0000 mL | Freq: Three times a day (TID) | Status: DC
  Administered 2017-06-18 – 2017-06-23 (×19): 200 mL

## 2017-06-18 MED ORDER — CIPROFLOXACIN-DEXAMETHASONE 0.3-0.1 % OT SUSP
4.0000 [drp] | Freq: Two times a day (BID) | OTIC | Status: AC
  Administered 2017-06-18: 4 [drp] via OTIC
  Filled 2017-06-18: qty 7.5

## 2017-06-18 MED ORDER — LABETALOL HCL 5 MG/ML IV SOLN
10.0000 mg | INTRAVENOUS | Status: DC | PRN
  Filled 2017-06-18: qty 4

## 2017-06-18 MED ORDER — VITAL 1.5 CAL PO LIQD
1000.0000 mL | ORAL | Status: DC
  Administered 2017-06-19 – 2017-06-21 (×4): 1000 mL
  Filled 2017-06-18 (×6): qty 1000

## 2017-06-18 MED ORDER — TRAMADOL HCL 50 MG PO TABS
50.0000 mg | ORAL_TABLET | Freq: Four times a day (QID) | ORAL | Status: DC | PRN
  Administered 2017-06-19 – 2017-07-06 (×24): 50 mg
  Filled 2017-06-18 (×24): qty 1

## 2017-06-18 MED ORDER — PROCHLORPERAZINE EDISYLATE 5 MG/ML IJ SOLN: 5.0000 mg | Freq: Four times a day (QID) | INTRAMUSCULAR | Status: DC | PRN

## 2017-06-18 MED ORDER — DOCUSATE SODIUM 50 MG/5ML PO LIQD: 100.0000 mg | Freq: Every day | ORAL | Status: DC

## 2017-06-18 MED ORDER — METOPROLOL TARTRATE 50 MG PO TABS
100.0000 mg | ORAL_TABLET | Freq: Two times a day (BID) | ORAL | Status: DC
  Administered 2017-06-18 – 2017-07-05 (×28): 100 mg
  Filled 2017-06-18 (×37): qty 2

## 2017-06-18 MED ORDER — TRAZODONE HCL 50 MG PO TABS
25.0000 mg | ORAL_TABLET | Freq: Every evening | ORAL | Status: DC | PRN
  Administered 2017-06-25: 25 mg via ORAL
  Administered 2017-06-25 – 2017-06-28 (×4): 50 mg via ORAL
  Filled 2017-06-18 (×6): qty 1

## 2017-06-18 MED ORDER — ACETAZOLAMIDE 250 MG PO TABS
250.0000 mg | ORAL_TABLET | Freq: Three times a day (TID) | ORAL | Status: DC
  Administered 2017-06-18 – 2017-06-28 (×27): 250 mg
  Filled 2017-06-18 (×32): qty 1

## 2017-06-18 MED ORDER — ACETAMINOPHEN 325 MG PO TABS
325.0000 mg | ORAL_TABLET | ORAL | Status: DC | PRN
  Administered 2017-06-19 – 2017-07-04 (×11): 650 mg via ORAL
  Filled 2017-06-18 (×9): qty 2
  Filled 2017-06-18: qty 1
  Filled 2017-06-18 (×3): qty 2

## 2017-06-18 MED ORDER — CHLORHEXIDINE GLUCONATE 0.12% ORAL RINSE (MEDLINE KIT)
15.0000 mL | Freq: Two times a day (BID) | OROMUCOSAL | Status: DC
  Administered 2017-06-18 – 2017-07-07 (×19): 15 mL via OROMUCOSAL

## 2017-06-18 MED ORDER — DOCUSATE SODIUM 50 MG/5ML PO LIQD
100.0000 mg | Freq: Every day | ORAL | Status: DC
  Administered 2017-06-19 – 2017-07-06 (×15): 100 mg
  Filled 2017-06-18 (×18): qty 10

## 2017-06-18 MED ORDER — INSULIN ASPART 100 UNIT/ML ~~LOC~~ SOLN
0.0000 [IU] | SUBCUTANEOUS | Status: DC
  Administered 2017-06-18 – 2017-06-19 (×2): 2 [IU] via SUBCUTANEOUS
  Administered 2017-06-19 (×2): 5 [IU] via SUBCUTANEOUS
  Administered 2017-06-19 (×2): 3 [IU] via SUBCUTANEOUS
  Administered 2017-06-20: 2 [IU] via SUBCUTANEOUS
  Administered 2017-06-20 (×4): 3 [IU] via SUBCUTANEOUS
  Administered 2017-06-21: 5 [IU] via SUBCUTANEOUS
  Administered 2017-06-21: 3 [IU] via SUBCUTANEOUS
  Administered 2017-06-21: 8 [IU] via SUBCUTANEOUS
  Administered 2017-06-22: 5 [IU] via SUBCUTANEOUS
  Administered 2017-06-22: 4 [IU] via SUBCUTANEOUS
  Administered 2017-06-22: 2 [IU] via SUBCUTANEOUS
  Administered 2017-06-22: 5 [IU] via SUBCUTANEOUS
  Administered 2017-06-22: 2 [IU] via SUBCUTANEOUS
  Administered 2017-06-22: 5 [IU] via SUBCUTANEOUS
  Administered 2017-06-23: 3 [IU] via SUBCUTANEOUS
  Administered 2017-06-23: 2 [IU] via SUBCUTANEOUS
  Administered 2017-06-23: 5 [IU] via SUBCUTANEOUS
  Administered 2017-06-23 (×2): 3 [IU] via SUBCUTANEOUS
  Administered 2017-06-24: 2 [IU] via SUBCUTANEOUS
  Administered 2017-06-24: 5 [IU] via SUBCUTANEOUS
  Administered 2017-06-24 – 2017-06-25 (×4): 3 [IU] via SUBCUTANEOUS
  Administered 2017-06-26: 2 [IU] via SUBCUTANEOUS
  Administered 2017-06-26: 5 [IU] via SUBCUTANEOUS
  Administered 2017-06-26: 3 [IU] via SUBCUTANEOUS
  Administered 2017-06-27: 8 [IU] via SUBCUTANEOUS
  Administered 2017-06-27: 2 [IU] via SUBCUTANEOUS
  Administered 2017-06-27: 3 [IU] via SUBCUTANEOUS
  Administered 2017-06-27: 2 [IU] via SUBCUTANEOUS
  Administered 2017-06-27: 3 [IU] via SUBCUTANEOUS
  Administered 2017-06-27 – 2017-06-28 (×3): 5 [IU] via SUBCUTANEOUS
  Administered 2017-06-28 (×2): 3 [IU] via SUBCUTANEOUS
  Administered 2017-06-28: 5 [IU] via SUBCUTANEOUS
  Administered 2017-06-29: 3 [IU] via SUBCUTANEOUS
  Administered 2017-06-29: 5 [IU] via SUBCUTANEOUS
  Administered 2017-06-29 (×2): 3 [IU] via SUBCUTANEOUS
  Administered 2017-06-29: 5 [IU] via SUBCUTANEOUS
  Administered 2017-06-29 – 2017-06-30 (×2): 3 [IU] via SUBCUTANEOUS
  Administered 2017-06-30 (×2): 5 [IU] via SUBCUTANEOUS
  Administered 2017-07-01 (×6): 3 [IU] via SUBCUTANEOUS
  Administered 2017-07-02 (×2): 5 [IU] via SUBCUTANEOUS

## 2017-06-18 MED ORDER — ASPIRIN 325 MG PO TABS
325.0000 mg | ORAL_TABLET | Freq: Every day | ORAL | Status: DC
  Administered 2017-06-19 – 2017-06-29 (×9): 325 mg
  Filled 2017-06-18 (×11): qty 1

## 2017-06-18 MED ORDER — PRO-STAT SUGAR FREE PO LIQD
30.0000 mL | Freq: Every day | ORAL | Status: DC
  Administered 2017-06-19 – 2017-06-23 (×5): 30 mL
  Filled 2017-06-18 (×5): qty 30

## 2017-06-18 MED ORDER — INSULIN ASPART 100 UNIT/ML ~~LOC~~ SOLN
4.0000 [IU] | SUBCUTANEOUS | Status: DC
  Administered 2017-06-18 – 2017-06-29 (×54): 4 [IU] via SUBCUTANEOUS

## 2017-06-18 MED ORDER — CLOPIDOGREL BISULFATE 75 MG PO TABS
75.0000 mg | ORAL_TABLET | Freq: Every day | ORAL | Status: DC
  Administered 2017-06-19 – 2017-06-24 (×6): 75 mg
  Filled 2017-06-18 (×6): qty 1

## 2017-06-18 NOTE — Progress Notes (Signed)
Received pt to 4W10 from 69M via bed, accompanied by daughter. Drowsy( pt received Ativan prior to rehab admit). Awakens easily, follows commands, nods appropriately to questions. VSS, denies pain at present.. Pt and daughter oriented to room and rehab process.

## 2017-06-18 NOTE — Discharge Summary (Signed)
DISCHARGE SUMMARY  [name]  MR#: [id number]  DOB:1951-01-16  Date of Admission: 05/31/2017 Date of Discharge: 06/18/2017  Attending Physician:[name]  Patient'King HYW:[id number], Morgan Ha, King  Consults: PCCM Neurology - Stroke Team  Vascular Surgery   Disposition: D/C to CIR   Follow-up Appts: Follow-up Information    Melvenia Beam, King. Schedule an appointment as soon as possible for a visit in 6 week(King).   Specialty:  Neurology Contact information: [place] [address] [phone]          Tests Needing Follow-up: -outpt f/u of moderate L ICA stenosis  -follow up with PCP Dr. 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 Pt was seen by her King and then had an MRI brain 8/13 which noted multiple small areas of acute infarct in the left parietal cortex and left occipital cortex. She was admitted to the hospital for evaluation.  8/16pt was in cath lab for diagnostic carotid and cerebral angiogram with Vascular Surgerywhen she became suddenly unresponsive with L sided weakness and possible L sided  seizure-like activity. CTA revealed bilateral M1 occlusions. She was intubated and taken urgently to IR for revascularization.   Hospital Course:  In-hospital stroke - bilateral MCA acute thrombosis, likely related to cerebral angiogram procedure treated with bilateral mechanical embolectomy with embotrap device with excellent revascularization / Pre-admission stroke: acute and subacute small pathcy infarcts in the left MCA distribution, possibly artery to artery embolic due to left ICA stenosis -Bilateral MCA occlusion, LICA stenosis King/P revascularization by IR -Aspirin +Plavix to continue as per Neuro   Left ICA stenosis  CTA neck - left ICA 60-65%  CUS - left ICA 40-59%  DSA: left ICA < 50% stenosis  No intervention needed at this time  Continue follow-up as outpatient  Altered mental status -Multifactorial acute ischemic stroke, MSSA bronchitis/pneumonia -Resolved  Acute respiratory failure with hypoxia / Vocal Cord Edema and stridor  -King/P tracheostomy 8/23 -Currently on trach collar tolerating well -Speech continues to work with patient increase PMV use -PCCM change #6 trach cuff-->#6 trach cuffless -weaning as per PCCM who continues to follow   MSSA Pneumonia bilateral lower lobes -Completed 7+ days of antibiotic  Chronic diastolic CHF -no clinical evidence of signif volume overload at time of d/c   Essential HTN  BP stable at time of d/c - cont to follow in outpt setting   Diabetes type 2 uncontrolled with complications  2/70 [id number] - CBG quite variable at 134-293 - no meds to be changed prior to d/c - follow CBG  Acute Urinary retention Cont foley - voiding trials when more mobile   Dysphagia MBS 8/30 suggested not yet ready for  oral meals, but DID show improvement - will not yet pursue PEG and cont to work w/ SLP   Protein calorie malnutrition Cont temp nutrition support via NG Cortrak tube   Meds at Time of D/C to CIR  Current  Facility-Administered Medications:  .  0.9 %  sodium chloride infusion, , Intravenous, Continuous, Morgan Altes, King, Last Rate: 30 mL/hr at 06/18/17 0132 .  [DISCONTINUED] acetaminophen (TYLENOL) tablet 650 mg, 650 mg, Oral, Q4H PRN **OR** acetaminophen (TYLENOL) solution 650 mg, 650 mg, Per Tube, Q4H PRN, 650 mg at 06/15/17 1740 **OR** acetaminophen (TYLENOL) suppository 650 mg, 650 mg, Rectal, Q4H PRN, [name], Sanjeev, King .  acetaZOLAMIDE (DIAMOX) tablet 250 mg, 250 mg, Per Tube, TID, Morgan Altes, King, 250 mg at 06/18/17 1100 .  albuterol (PROVENTIL) (2.5 MG/3ML) 0.083% nebulizer solution 2.5 mg, 2.5 mg, Nebulization, Q4H PRN, [name], Nishant, King .  aspirin tablet 325 mg, 325 mg, Per Tube, Daily, [name], King, 325 mg at 06/18/17 1100 .  chlorhexidine gluconate (MEDLINE KIT) (PERIDEX) 0.12 % solution 15 mL, 15 mL, Mouth Rinse, BID, [name], Morgan Baller, King, Stopped at 06/18/17 [phone] .  Chlorhexidine Gluconate Cloth 2 % PADS 6 each, 6 each, Topical, Daily, Morgan Altes, King, 6 each at 06/18/17 1100 .  ciprofloxacin-dexamethasone (CIPRODEX) 0.3-0.1 % OTIC (EAR) suspension 4 drop, 4 drop, Right EAR, BID, [name], Nishant, King, 4 drop at 06/18/17 1100 .  cloNIDine (CATAPRES - Dosed in mg/24 hr) patch 0.3 mg, 0.3 mg, Transdermal, Weekly, Morgan Gobble, King, 0.3 mg at 06/16/17 1233 .  clopidogrel (PLAVIX) tablet 75 mg, 75 mg, Per Tube, Q breakfast, [name], King, 75 mg at 06/18/17 0837 .  diclofenac sodium (VOLTAREN) 1 % transdermal gel 2 g, 2 g, Topical, QID PRN, Morgan Gravel, King, 2 g at 06/01/17 1057 .  diphenhydrAMINE (BENADRYL) injection 25 mg, 25 mg, Intravenous, Q6H PRN, Opyd, Ilene Qua, King, 25 mg at 06/18/17 0127 .  docusate (COLACE) 50 MG/5ML liquid 100 mg, 100 mg, Oral, Daily, [name], 100 mg at 06/18/17 1100 .  Domperidone 20 mg (2 x 10 mg tablets) - patient'King own supply, 20 mg, Per Tube, TID, [name], King, 20 mg at 06/18/17 1100 .  enoxaparin  (LOVENOX) injection 40 mg, 40 mg, Subcutaneous, Q24H, [name], 40 mg at 06/18/17 0835 .  estropipate (OGEN) tablet 0.75 mg, 0.75 mg, Per Tube, Daily, Morgan Catching King, King, 0.75 mg at 06/18/17 1100 .  famotidine (PEPCID) IVPB 20 mg premix, 20 mg, Intravenous, Q24H, Morgan Glazier, King, Stopped at 06/17/17 1644 .  feeding supplement (PRO-STAT SUGAR FREE 64) liquid 30 mL, 30 mL, Per Tube, Daily, Allie Bossier, King, 30 mL at 06/18/17 1100 .  feeding supplement (VITAL 1.5 CAL) liquid 1,000 mL, 1,000 mL, Per Tube, Continuous, Allie Bossier, King, Last Rate: 50 mL/hr at 06/18/17 0138, 1,000 mL at 06/18/17 0138 .  ibuprofen (ADVIL,MOTRIN) 100 MG/5ML suspension 600 mg, 600 mg, Per Tube, Q8H PRN, Morgan Catching King, King .  insulin aspart (novoLOG) injection 0-15 Units, 0-15 Units, Subcutaneous, Q4H, Mannam, Praveen, King, 3 Units at 06/18/17 1300 .  insulin aspart (novoLOG) injection 4 Units, 4 Units, Subcutaneous, Q4H, Erick Colace, King, 4 Units at 06/18/17 1300 .  insulin glargine (LANTUS) injection 17 Units, 17 Units, Subcutaneous, Daily, Allie Bossier, King, 17 Units at 06/18/17 1100 .  labetalol (NORMODYNE,TRANDATE) injection 10 mg, 10 mg, Intravenous, Q10 min PRN, Patteson, Arlan Organ, King,  10 mg at 06/07/17 2140 .  LORazepam (ATIVAN) injection 1 mg, 1 mg, Intravenous, Q8H, Knox Royalty, King, 1 mg at 06/18/17 1340 .  MEDLINE mouth rinse, 15 mL, Mouth Rinse, QID, [name] Morgan Baller, King, 15 mL at 06/17/17 1848 .  metoprolol tartrate (LOPRESSOR) tablet 100 mg, 100 mg, Per Tube, BID, Morgan Gobble, King, 100 mg at 06/18/17 1100 .  morphine 4 MG/ML injection 1-2 mg, 1-2 mg, Intravenous, Q3H PRN, Morgan Altes, King .  ondansetron [facility]) injection 4 mg, 4 mg, Intravenous, Q6H PRN, [name], Nishant, King, 4 mg at 06/14/17 1220 .  pantoprazole (PROTONIX) EC tablet 40 mg, 40 mg, Oral, Daily PRN, Morgan Gravel, King .  phenol [facility]) mouth spray 1 spray, 1 spray, Mouth/Throat, PRN, Allie Bossier, King, 1 spray at 06/13/17 2312 .  sodium chloride flush (NS) 0.9 % injection 10-40 mL, 10-40 mL, Intracatheter, Q12H, Morgan Altes, King, 10 mL at 06/18/17 1340 .  sodium chloride flush (NS) 0.9 % injection 10-40 mL, 10-40 mL, Intracatheter, PRN, Morgan Altes, King .  traMADol Veatrice Bourbon) tablet 50 mg, 50 mg, Per Tube, Q6H PRN, Morgan Altes, King, 50 mg at 06/18/17 0127   Day of Discharge BP (!) 142/80   Pulse 77   Temp 98.2 F (36.8 C) (Oral)   Resp 17   Ht 5' 4"  (1.626 m)   Wt 76.4 kg (168 lb 6.9 oz)   SpO2 100%   BMI 28.91 kg/m   Physical Exam: General: No acute respiratory distress on TC Lungs: Clear to auscultation bilaterally without wheezes or crackles Cardiovascular: Regular rate and rhythm without murmur gallop or rub normal S1 and S2 Abdomen: Nontender, nondistended, soft, bowel sounds positive, no rebound, no ascites, no appreciable mass Extremities: trace King LE edema w/o cyanosis   Basic Metabolic Panel:  Recent Labs Lab 06/13/17 0250 06/14/17 0309 06/15/17 1854 06/16/17 1242 06/17/17 1221 06/18/17 0459  NA 144 141 138 138 141 140  K 4.1 3.0* 4.0 4.1 4.0 4.0  CL 105 105 106 107 106 106  CO2 28 27 23 25 28 27   GLUCOSE 67 78 248* 270* 75 155*  BUN 36* 40* 33* 22* 18 17  CREATININE 0.82 0.88 0.82 0.79 0.65 0.77  CALCIUM 9.1 8.5* 8.1* 8.4* 8.5* 8.7*  MG 2.2  --  1.8  --   --   --   PHOS 2.3*  --   --   --   --   --    CBC:  Recent Labs Lab 06/14/17 0309 06/15/17 1854 06/16/17 1242 06/17/17 1221 06/18/17 0459  WBC 11.0* 10.4 10.4 11.5* 11.3*  HGB 8.1* 7.6* 7.6* 9.4* 9.4*  HCT 26.5* 25.0* 24.8* 30.2* 30.3*  MCV 89.5 89.9 89.9 87.3 89.4  PLT 405* 377 412* 401* 390    CBG:  Recent Labs Lab 06/17/17 2100 06/18/17 0049 06/18/17 0437 06/18/17 0741 06/18/17 1213  GLUCAP 169* 134* 147* 204* 196*    Time spent in discharge (includes decision making & examination of pt): 35 minutes  06/18/2017, 1:37 PM   Morgan Altes, King [facility] Office  [phone] Pager [phone]  On-Call/Text Page:      [email or web address]      password [facility]

## 2017-06-18 NOTE — Progress Notes (Signed)
Physical Therapy Treatment Patient Details Name: [name] MRN: [id number] DOB: 02-May-1951 Today's Date: 06/18/2017    History of Present Illness Pt is a 66 y/o female with hx HTN, DM. She presented initially 8/13 to her PCP with headache, R hight weakness and vision changes. She was sent for MRI which revealed multiple areas of acute L MCA infarct. She was sent to ER and admitted for CVA workup. On 8/16 pt was in cath lab for diagnostic carotid and cerebral angiogram with vascular surgery when she became suddenly unresponsive with L sided weakness and possible L sided seizure-like activity. CTA revealed bilateral M1 occlusions. She was intubated and taken urgently to IR for revascularization and rapid resolution of embolism. Pt with ETT 8/16-8/20, 8/20-8/23, trach 8/23. Pt now on trach collar.    PT Comments    Pt with flat affect and required cues throughout to attend to therapist and task as she closes eyes and has difficulty maintaining eyes open. Pt with progressive mobility today and able to perform bed mobility, sitting balance and pivot to chair. PMSV utilized throughout session with sats 95-97% on RA. Pt EOB with cues, facilitation and assist for reaching with LUE, positioning RUE into pronation on surface, balance and righting reactions in sitting. Pt able to perform seated exercises with multimodal cues and increased time. Daughter present and educated for assisted HEP and encouraged to perform.Will continue to follow and CIR remains appropriate. Pt wants to be at son's wedding Sept 26.   BP pre 117/56, post179/96   Follow Up Recommendations  CIR;Supervision/Assistance - 24 hour     Equipment Recommendations       Recommendations for Other Services       Precautions / Restrictions Precautions Precautions: Fall Precaution Comments: cortrak, trach Restrictions Weight Bearing Restrictions: No    Mobility  Bed Mobility Overal bed mobility: Needs Assistance Bed  Mobility: Rolling;Sidelying to Sit Rolling: Min assist Sidelying to sit: Min assist;+2 for safety/equipment       General bed mobility comments: cues for sequence with segmental function of rolling, legs off then trunk up with min assist for legs and trunk with +2 for safety  Transfers Overall transfer level: Needs assistance   Transfers: Sit to/from Stand;Squat Pivot Transfers Sit to Stand: Max assist;+2 physical assistance   Squat pivot transfers: Total assist;+2 physical assistance     General transfer comment: attempted standing x 3 with max assist for anterior translation and rise with pad, belt and knees blocked. RLE buckling with assist to maintain extension. total assist to move pelvis and pivot to chair  Ambulation/Gait             General Gait Details: Unable   Stairs            Wheelchair Mobility    Modified Rankin (Stroke Patients Only) Modified Rankin (Stroke Patients Only) Pre-Morbid Rankin Score: No symptoms Modified Rankin: Severe disability     Balance Overall balance assessment: Needs assistance Sitting-balance support: Feet supported;Bilateral upper extremity supported Sitting balance-Leahy Scale: Poor Sitting balance - Comments: pt EOB 12 min with varied min to minguard. With assist for hand placement and verbal cues for position pt able to maintain static sitting grossly 30 sec at a time Postural control: Posterior lean;Right lateral lean Standing balance support: Bilateral upper extremity supported Standing balance-Leahy Scale: Zero Standing balance comment: knees blocked, pt maintains flexed posture  Cognition Arousal/Alertness: Awake/alert Behavior During Therapy: Flat affect Overall Cognitive Status: Impaired/Different from baseline Area of Impairment: Following commands;Safety/judgement;Problem solving                       Following Commands: Follows one step commands  inconsistently;Follows one step commands with increased time Safety/Judgement: Decreased awareness of deficits   Problem Solving: Slow processing;Decreased initiation;Requires verbal cues General Comments: follows commands, difficulty maintaining eyes open, difficult to understand speech with PMSV at times      Exercises General Exercises - Lower Extremity Long Arc Quad: AROM;Both;Seated;10 reps Hip Flexion/Marching: AROM;Both;Seated;10 reps    General Comments        Pertinent Vitals/Pain Pain Assessment: No/denies pain    Home Living                      Prior Function            PT Goals (current goals can now be found in the care plan section) Progress towards PT goals: Progressing toward goals    Frequency           PT Plan Current plan remains appropriate    Co-evaluation              AM-PAC PT "6 Clicks" Daily Activity  Outcome Measure  Difficulty turning over in bed (including adjusting bedclothes, sheets and blankets)?: Unable Difficulty moving from lying on back to sitting on the side of the bed? : Unable Difficulty sitting down on and standing up from a chair with arms (e.g., wheelchair, bedside commode, etc,.)?: Unable Help needed moving to and from a bed to chair (including a wheelchair)?: Total Help needed walking in hospital room?: Total Help needed climbing 3-5 steps with a railing? : Total 6 Click Score: 6    End of Session Equipment Utilized During Treatment: Gait belt Activity Tolerance: Patient tolerated treatment well Patient left: in chair;with chair alarm set;with call bell/phone within reach;with family/visitor present Nurse Communication: Mobility status;Precautions;Other (comment) (sequence for squat pivot with pad and drop arm) PT Visit Diagnosis: Other abnormalities of gait and mobility (R26.89);Hemiplegia and hemiparesis Hemiplegia - Right/Left: Right Hemiplegia - dominant/non-dominant: Dominant Hemiplegia - caused  by: Cerebral infarction     Time: 5732-2025 PT Time Calculation (min) (ACUTE ONLY): 32 min  Charges:  $Therapeutic Activity: 8-22 mins $Neuromuscular Re-education: 8-22 mins                    G Codes:       Elwyn Reach, PT [phone]    Shontell Prosser B Zion Ta 06/18/2017, 10:14 AM

## 2017-06-18 NOTE — Progress Notes (Signed)
Report called to [phone]  [facility].

## 2017-06-18 NOTE — Progress Notes (Signed)
Occupational Therapy Treatment Patient Details Name: [name] MRN: [id number] DOB: [birth year] Today's Date: 06/18/2017    History of present illness Pt is a 66 y/o female with hx HTN, DM. She presented initially 8/13 to her PCP with headache, R hight weakness and vision changes. She was sent for MRI which revealed multiple areas of acute L MCA infarct. She was sent to ER and admitted for CVA workup. On 8/16 pt was in cath lab for diagnostic carotid and cerebral angiogram with vascular surgery when she became suddenly unresponsive with L sided weakness and possible L sided seizure-like activity. CTA revealed bilateral M1 occlusions. She was intubated and taken urgently to IR for revascularization and rapid resolution of embolism. Pt with ETT 8/16-8/20, 8/20-8/23, trach 8/23. Pt now on trach collar.   OT comments  Pt progressing towards OT goals this session. Tolerated PSMV with O2 saturations remaining between 98-95% throughout. Distractions limited during session to work on increased directions following during seated ADL tasks. Pt continues to require increased time, and inconsistent for task completion. Pt also worked on truncal control and core strength. Pt continues to require CIR level therapy, she is very motivated by her son's upcoming wedding.   Follow Up Recommendations  CIR;Supervision/Assistance - 24 hour    Equipment Recommendations  Other (comment) (TBD at next venue)    Recommendations for Other Services      Precautions / Restrictions Precautions Precautions: Fall Precaution Comments: cortrak, trach Restrictions Weight Bearing Restrictions: No       Mobility Bed Mobility Overal bed mobility: Needs Assistance Bed Mobility: Rolling;Sidelying to Sit Rolling: Min assist Sidelying to sit: Min assist;+2 for safety/equipment       General bed mobility comments: Pt OOB in recliner when OT entered the room  Transfers Overall transfer level: Needs assistance    Transfers: Sit to/from [employer] Transfers Sit to Stand: Max assist;+2 physical assistance   Squat pivot transfers: Total assist;+2 physical assistance     General transfer comment: transferred deferred at this time    Balance Overall balance assessment: Needs assistance Sitting-balance support: Feet supported;Bilateral upper extremity supported Sitting balance-Leahy Scale: Poor Sitting balance - Comments: Pt unable to maintain sitting at the edge of recliner without BUE support Postural control: Posterior lean;Right lateral lean Standing balance support: Bilateral upper extremity supported Standing balance-Leahy Scale: Zero Standing balance comment: knees blocked, pt maintains flexed posture                            ADL either performed or assessed with clinical judgement   ADL Overall ADL's : Needs assistance/impaired     Grooming: Wash/dry hands;Wash/dry face;Oral care;Sitting;Minimal assistance Grooming Details (indicate cue type and reason): in recliner, suction oral care kit used to prevent aspiration; Pt uing LUE as primary - encouraging use of RUE throughout Upper Body Bathing: Min guard;Sitting Upper Body Bathing Details (indicate cue type and reason): in recliner, front only                         Functional mobility during ADLs:  (declined at this time) General ADL Comments: use of PSMV during session      Vision       Perception     Praxis      Cognition Arousal/Alertness: Awake/alert Behavior During Therapy: Flat affect Overall Cognitive Status: Impaired/Different from baseline Area of Impairment: Following commands;Safety/judgement;Problem solving  Following Commands: Follows one step commands inconsistently;Follows one step commands with increased time Safety/Judgement: Decreased awareness of deficits   Problem Solving: Slow processing;Decreased initiation;Requires verbal cues;Requires  tactile cues;Difficulty sequencing General Comments: difficult to understand speech at this time with PMSV         Exercises Exercises: Other exercises General Exercises - Lower Extremity Long Arc Quad: AROM;Both;Seated;10 reps Hip Flexion/Marching: AROM;Both;Seated;10 reps Other Exercises Other Exercises: Worked on trunk sitting in recliner, coming forwards and holding x5   Shoulder Instructions       General Comments Pt's daughter present for session    Pertinent Vitals/ Pain       Pain Assessment: Faces Faces Pain Scale: Hurts whole lot Pain Location: BLE, headache Pain Descriptors / Indicators: Headache;Moaning;Cramping;Grimacing;Guarding Pain Intervention(s): Monitored during session;Limited activity within patient's tolerance  Home Living                                          Prior Functioning/Environment              Frequency  Min 2X/week        Progress Toward Goals  OT Goals(current goals can now be found in the care plan section)  Progress towards OT goals: Progressing toward goals  Acute Rehab OT Goals Patient Stated Goal: to eat some food OT Goal Formulation: With patient Time For Goal Achievement: 06/21/17 Potential to Achieve Goals: Good  Plan Discharge plan remains appropriate    Co-evaluation                 AM-PAC PT "6 Clicks" Daily Activity     Outcome Measure   Help from another person eating meals?: Total Help from another person taking care of personal grooming?: A Little Help from another person toileting, which includes using toliet, bedpan, or urinal?: Total Help from another person bathing (including washing, rinsing, drying)?: A Lot Help from another person to put on and taking off regular upper body clothing?: A Lot Help from another person to put on and taking off regular lower body clothing?: Total 6 Click Score: 10    End of Session Equipment Utilized During Treatment: Oxygen  OT Visit  Diagnosis: Muscle weakness (generalized) (M62.81)   Activity Tolerance Patient tolerated treatment well   Patient Left in chair;with call bell/phone within reach;with family/visitor present   Nurse Communication Patient requests pain meds (for BLE)        Time: 1010-1031 OT Time Calculation (min): 21 min  Charges: OT General Charges $OT Visit: 1 Visit OT Treatments $Self Care/Home Management : 8-22 mins  Hulda Humphrey OTR/L [place] 06/18/2017, 12:47 PM

## 2017-06-18 NOTE — Progress Notes (Signed)
Nurse referred need for support for patient daughter. Patient here for long time and daughter tired. Prayed with patient and mom (not awake) Patient to be going to rehab soon on 4th floor. Butch Penny

## 2017-06-18 NOTE — Progress Notes (Signed)
Patient tolerated sitting up in bedside chair fairly well without difficulty with encouragement. Daughter at the bedside .  Excellent support person.

## 2017-06-18 NOTE — Progress Notes (Signed)
[name], MD Physician Signed Physical Medicine and Rehabilitation  Consult Note Date of Service: 06/14/2017 8:30 AM  Related encounter: ED to Hosp-Admission (Current) from 05/31/2017 in [place] All Collapse All   [] Hide copied text [] Hover for attribution information      Physical Medicine and Rehabilitation Consult   Reason for Consult: Bilateral strokes with decline in mobility and ability to carry out ADL tasks.    Referring Physician: Dr. Sherral Hammers.    HPI: [name] is a 66 y.o. 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 She was sent to the ED for full work up. History taken from chart review. Carotid dopplers done revealing L-40-59% ICA stenosis. Cardiac echo revealed  EF 60-65% with no wall abnormality and grade 1 diastolic dysfunction.  CTA head/neck done revealing large non-calcified plaque in proximal L-ICA causing 60-65% stenosis and likely source of emboli.   She underwent cerebral angiogram for work up on 8/16 revealing < 50% L-ICA stenosis and during the procedure became unresponsive with bilateral UE tonic posturing and left sided weakness. She  was taken to IR for endovascualr revascularization of B-MCA occlusions with TICI reperfusion bilaterally. Dr. Erlinda Hong recommended DAPT x 3 months followed by Plavix alone. EEG revealed mild generalized dysfunction and intermittent BUE twitching not associated with epileptiform activity. Follow up MRI brain reviewed, showing multiple infarcts.  Per report, numerous acute new supra and infratentorial infarcts involving B-MCA, and bilateral PCA- no hemorrhage.  She was briefly extubated on 8/20 but developed stridor with increase WOB requiring reintubation and beside tracheostomy on 8/23  by Dr. Nelda Marseille.   She was extubated to ATC and PMSV trails initiated with ST only. She has had issues with copious secretions as well as wet voice. Therapy re-evaluations done revealing diffuse weakness requiring total assist with BLE blocked for transfers and remains NPO. CIR recommended due to decline in mobility and ability to carry out ADL tasks.    Review of Systems  Constitutional: Positive for diaphoresis and malaise/fatigue (bad night due to cough/deep suctioning ).  HENT: Positive for ear pain.   Eyes: Positive for blurred vision.       Loss of peripheral vision as well as additional deficits after cataract extraction  Respiratory: Positive for cough, shortness of breath and wheezing.   Cardiovascular: Negative for chest pain.  Gastrointestinal: Positive for abdominal pain and diarrhea.  Musculoskeletal: Negative for back pain.  Skin: Negative for rash.  Neurological: Positive for speech change, focal weakness and headaches.  All other systems reviewed and are negative.         Past Medical History:  Diagnosis Date  . Anemia    takes iron supplement  . Anesthesia complication    disorientation due to pseudotumor  . Diabetes mellitus    IDDM  . Family history of anesthesia complication 78 yrs ago   brother stopped breathing for a minute or two  . GERD (gastroesophageal reflux disease)   . Headache(784.0)    due to pseudotumor; daily headache  . Hypercholesteremia    unable to tolerate statins  . Hypertension    under control with meds., has been on med. x 5 yr.  . Peripheral neuropathy   . PONV (postoperative nausea and vomiting)   . Pseudotumor cerebri  has lumbar peritoneal shunt  . Seizures ([place])    due to shunt failure; last seizure 2007  . Synovitis of ankle 04/2013   left  . Wears dentures    full         Past Surgical History:  Procedure Laterality Date  . ANKLE ARTHROSCOPY Left 05/18/2013   Procedure: LEFT ANKLE  ARTHROSCOPY WITH DEBRIDEMENT,  SUBTALAR OPEN DEBRIDEMENT ;  Surgeon: Wylene Simmer, MD;  Location: [place];  Service: Orthopedics;  Laterality: Left;  . APPENDECTOMY     as a child  . BALLOON DILATION N/A 07/04/2013   Procedure: BALLOON DILATION;  Surgeon: Garlan Fair, MD;  Location: [facility];  Service: Endoscopy;  Laterality: N/A;  . BLADDER SUSPENSION    . BREAST LUMPECTOMY W/ NEEDLE LOCALIZATION Left 05/22/2011  . CAROTID ANGIOGRAPHY Bilateral 06/03/2017   Procedure: Bilateral Carotid Angiography;  Surgeon: Conrad [place], MD;  Location: [facility];  Service: Cardiovascular;  Laterality: Bilateral;  . COLONOSCOPY WITH PROPOFOL N/A 05/14/2014   Procedure: COLONOSCOPY WITH PROPOFOL;  Surgeon: Garlan Fair, MD;  Location: [facility];  Service: Endoscopy;  Laterality: N/A;  . ESOPHAGEAL DILATION  06/23/2006; 08/05/2004  . ESOPHAGOGASTRODUODENOSCOPY N/A 07/04/2013   Procedure: ESOPHAGOGASTRODUODENOSCOPY (EGD);  Surgeon: Garlan Fair, MD;  Location: [facility];  Service: Endoscopy;  Laterality: N/A;  . ESOPHAGOGASTRODUODENOSCOPY (EGD) WITH PROPOFOL N/A 05/14/2014   Procedure: ESOPHAGOGASTRODUODENOSCOPY (EGD) WITH PROPOFOL;  Surgeon: Garlan Fair, MD;  Location: [facility];  Service: Endoscopy;  Laterality: N/A;  . IR ANGIO VERTEBRAL SEL SUBCLAVIAN INNOMINATE UNI R MOD SED  06/03/2017  . IR PERCUTANEOUS ART THROMBECTOMY/INFUSION INTRACRANIAL INC DIAG ANGIO  06/03/2017  . IR PERCUTANEOUS ART THROMBECTOMY/INFUSION INTRACRANIAL INC DIAG ANGIO  06/03/2017  . LUMBAR PERITONEAL SHUNT     x 2  . OVARIAN CYST REMOVAL     age 36  . RADIOLOGY WITH ANESTHESIA N/A 06/03/2017   Procedure: RADIOLOGY WITH ANESTHESIA;  Surgeon: Luanne Bras, MD;  Location: [place];  Service: Radiology;  Laterality: N/A;  . SHUNT REVISION  2007  . TOTAL ABDOMINAL HYSTERECTOMY W/ BILATERAL SALPINGOOPHORECTOMY  1994   age 60s  . WRIST SURGERY Left 2014    dr  Amedeo Plenty         Family History  Problem Relation Age of Onset  . Heart disease Father   . Migraines Neg Hx     Social History:  Married--lives with family. Husband works part time. Independent with AD in the home. Per  reports that she has never smoked. She has never used smokeless tobacco. Per reports she does not drink alcohol or use drugs.         Allergies  Allergen Reactions  . Carbamazepine Hives, Shortness Of Breath and Other (See Comments)  . Penicillins Anaphylaxis and Other (See Comments)    Mother, father and brother have history of anaphylaxis reaction to penicillin so pt does not take   . Statins Other (See Comments)    Severe muscle weakness, leg numbness, severe headaches, chest pain   . Tricyclic Antidepressants Other (See Comments)    IMPAIRED MEMORY  . Atorvastatin Other (See Comments)    Severe muscle weakness, leg numbness, severe headaches, chest pain   . Ezetimibe Other (See Comments)    Severe muscle weakness, leg numbness, severe headaches, chest pain   . Nortriptyline Other (See Comments)    IMPAIRED MEMORY  . Quinapril Hcl Other (See Comments)    Unknown reaction  . Rifampin Diarrhea  .  Metoclopramide Nausea And Vomiting and Rash          Medications Prior to Admission  Medication Sig Dispense Refill  . aspirin EC 81 MG tablet Take 81 mg by mouth daily.    . cloNIDine (CATAPRES) 0.3 MG tablet Take 0.3 mg by mouth 2 (two) times daily.     . diclofenac sodium (VOLTAREN) 1 % GEL Apply 2 application topically 4 (four) times daily as needed (pain).     Marland Kitchen esomeprazole (NEXIUM) 40 MG capsule Take 40 mg by mouth daily as needed (acid reflux). Acid reflux    . estropipate (OGEN) 1.5 MG tablet Take 0.75 mg by mouth daily.     . furosemide (LASIX) 20 MG tablet Take 20 mg by mouth daily.    Marland Kitchen gabapentin (NEURONTIN) 100 MG capsule Take 100 mg by mouth at bedtime.    Marland Kitchen glipiZIDE (GLUCOTROL) 10 MG tablet Take 10 mg  by mouth 2 (two) times daily before a meal.     . insulin glargine (LANTUS) 100 unit/mL SOPN Inject 32 Units into the skin at bedtime.    . Melatonin 10 MG TABS Take 10 mg by mouth at bedtime as needed (sleep).    . metFORMIN (GLUCOPHAGE-XR) 500 MG 24 hr tablet Take 1,000 mg by mouth 3 (three) times daily with meals.    . metoprolol (LOPRESSOR) 50 MG tablet Take 50 mg by mouth at bedtime.     . metoprolol (TOPROL-XL) 100 MG 24 hr tablet Take 100 mg by mouth daily with breakfast.     . PRESCRIPTION MEDICATION Take 20 mg by mouth See admin instructions. Domperidone 10 mg from [place]: Take 2 tablets (20 mg) by mouth three times daily with meals - for diabetic gastroparesis    . telmisartan (MICARDIS) 80 MG tablet Take 80 mg by mouth at bedtime.     Marland Kitchen acetaZOLAMIDE (DIAMOX) 250 MG tablet Take 1 tablet (250 mg total) by mouth 3 (three) times daily. (Patient not taking: Reported on 05/31/2017) 90 tablet 12    Home: [place] expects to be discharged to:: Private residence Living Arrangements: Children, Spouse/significant other Available Help at Discharge: Family, Available 24 hours/day Type of Home: House Home Access: Stairs to enter [employer] of Steps: 4 Entrance Stairs-Rails: Right, Left, Can reach both Home Layout: One level Bathroom Shower/Tub: Multimedia programmer: Handicapped height Home Equipment: Grab bars - tub/shower, Grab bars - toilet, Shower seat, Cane - single point, Environmental consultant - 4 wheels  Functional History: Prior Function Level of Independence: Independent with assistive device(s), Needs assistance Gait / Transfers Assistance Needed: occasionally uses rollator  ADL's / Homemaking Assistance Needed: Pt reports she completes ADLs independently approx 90-95% of the time; intermittently needs assistance/supervision for bathing/dressing ADLs Functional Status:  Mobility: Bed Mobility Overal bed mobility: Needs Assistance Bed  Mobility: Supine to Sit Supine to sit: Max assist, +2 for physical assistance Sit to supine: Total assist, +2 for physical assistance General bed mobility comments: Patient able to move LEs to EOB, +2 max assist to elevate trunk and rotate to EOB. increased time and effort to perform. patient with posterior bias throughout Transfers Overall transfer level: Needs assistance Equipment used: None Transfers: Sit to/from Stand, Google Transfers Sit to Stand: Total assist, +2 physical assistance, +2 safety/equipment Squat pivot transfers: Total assist, +2 physical assistance General transfer comment: Patient attempting to intiate power up but required total assist to block bilateral LEs and transition trunk  Ambulation/Gait Ambulation/Gait assistance: Supervision Ambulation Distance (Feet):  210 Feet Assistive device: None Gait Pattern/deviations: Step-through pattern, Decreased stride length, Drifts right/left, Narrow base of support General Gait Details: Unable Gait velocity: decreased Gait velocity interpretation: Below normal speed for age/gender  ADL: ADL Overall ADL's : Needs assistance/impaired Eating/Feeding: Set up, Sitting Grooming: Moderate assistance, Sitting, Wash/dry face Grooming Details (indicate cue type and reason): sink level Upper Body Bathing: Min guard, Sitting Lower Body Bathing: Min guard, Sit to/from stand Upper Body Dressing : Min guard, Sitting Lower Body Dressing: Maximal assistance, Bed level Lower Body Dressing Details (indicate cue type and reason): to don socks Toilet Transfer: Total assistance, +2 for physical assistance, Control and instrumentation engineer Details (indicate cue type and reason): Simulated by transfer EOB>chair Toileting- Clothing Manipulation and Hygiene: Modified independent, Sit to/from stand Toileting - Clothing Manipulation Details (indicate cue type and reason): hospital gown and peri care Functional mobility during ADLs: Total  assistance, +2 for physical assistance (squat pivot only) General ADL Comments: Pt tolerated sitting EOB x7 minutes with max posterior support. VSS thoughout.  Cognition: Cognition Overall Cognitive Status: Difficult to assess Orientation Level: Intubated/Tracheostomy - Unable to assess Cognition Arousal/Alertness: Awake/alert Behavior During Therapy: Restless Overall Cognitive Status: Difficult to assess General Comments: Folowing simple commands with improved consistency Difficult to assess due to: Tracheostomy   Blood pressure (!) 151/63, pulse 93, temperature 98.6 F (37 C), temperature source Oral, resp. rate (!) 22, height 5\' 4"  (1.626 m), weight 75.5 kg (166 lb 7.2 oz), SpO2 96 %. Physical Exam  Nursing note and vitals reviewed. Constitutional: She appears well-developed and well-nourished. No distress.  HENT:  Head: Normocephalic and atraumatic.  Mouth/Throat: Oropharynx is clear and moist.  +NG  Eyes: Pupils are equal, round, and reactive to light. Conjunctivae and EOM are normal.  Neck: Normal range of motion.  Cuffed 6 tach in place with occasional stridor? with cough  Cardiovascular: Normal rate and regular rhythm.   Respiratory: Effort normal. No stridor. She has wheezes. She exhibits no tenderness.  Frequent silent cough-- productive with mucoid secretions via trach  GI: Soft. Bowel sounds are normal. She exhibits distension. There is tenderness.  Genitourinary:  Genitourinary Comments: Foley and rectal tube in place.   Musculoskeletal: She exhibits no edema or tenderness.  Neurological: She is alert.  Aphonic.  Flat and distracted.  Limited by coughing episodes.  Able to follow simple one and two step commands.  Motor: RUE/RLE: shoulder abduction, elbow flex/ext 3/5, hand grip 1/5 RLE: HF, KE, ADF/PF 3+/5 LUE/LLE: 4-/5 proximal to distal A&Ox2  Skin: Skin is warm and dry. No rash noted. She is not diaphoretic.  Psychiatric: Her affect is blunt. She is  withdrawn.      Assessment/Plan: Diagnosis: Bilateral strokes Labs and images independently reviewed.  Records reviewed and summated above. Stroke: Continue secondary stroke prophylaxis and Risk Factor Modification listed below:   Antiplatelet therapy:   Blood Pressure Management:  Continue current medication with prn's with permisive HTN per primary team Statin Agent:   Diabetes management:   R>L sided hemiparesis: fit for orthosis to prevent contractures (resting hand splint for day, wrist cock up splint at night, PRAFO, etc) Motor recovery: Fluoxetine  1. Does the need for close, 24 hr/day medical supervision in concert with the patient's rehab needs make it unreasonable for this patient to be served in a less intensive setting? Yes and Potentially 2. Co-Morbidities requiring supervision/potential complications: DM (Monitor in accordance with exercise and adjust meds as necessary), HTN (monitor and provide prns in accordance with increased physical exertion  and pain), peripheral neuropathy (cont to monitor, meds if necessary), pseudotumor cerebri, seizure disorder (cont meds), hypokalemia (continue to monitor and replete as necessary), leukocytosis (cont to monitor for signs and symptoms of infection, further workup if indicated), ABLA (transfuse if necessary to ensure appropriate perfusion for increased activity tolerance), post-op pain (Biofeedback training with therapies to help reduce reliance on opiate and IV pain medications, particularly IV Toradol, monitor pain control during therapies, and sedation at rest and titrate to maximum efficacy to ensure participation and gains in therapies), dysphagia (advance diet as tolerated) 3. Due to bladder management, bowel management, safety, disease management, medication administration, pain management and patient education, does the patient require 24 hr/day rehab nursing? Yes 4. Does the patient require coordinated care of a physician, rehab  nurse, PT (1-2 hrs/day, 5 days/week), OT (1-2 hrs/day, 5 days/week) and SLP (1-2 hrs/day, 5 days/week) to address physical and functional deficits in the context of the above medical diagnosis(es)? Yes Addressing deficits in the following areas: balance, endurance, locomotion, strength, transferring, bowel/bladder control, bathing, dressing, feeding, grooming, toileting, cognition, speech, language, swallowing and psychosocial support 5. Can the patient actively participate in an intensive therapy program of at least 3 hrs of therapy per day at least 5 days per week? Potentially 6. The potential for patient to make measurable gains while on inpatient rehab is good 7. Anticipated functional outcomes upon discharge from inpatient rehab are mod assist  with PT, mod assist with OT, min assist and mod assist with SLP. 8. Estimated rehab length of stay to reach the above functional goals is: 22-27 days. 9. Anticipated D/C setting: Other 10. Anticipated post D/C treatments: ?LTACH 11. Overall Rehab/Functional Prognosis: good  RECOMMENDATIONS: This patient's condition is appropriate for continued rehabilitative care in the following setting: Will cont to follow as medical issues stabalize and patient able to tolerate 3 hours therapy/day.  However, at presents, pt will likely benefit from [facility]. Patient has agreed to participate in recommended program. Potentially Note that insurance prior authorization may be required for reimbursement for recommended care.  Comment: Rehab Admissions Coordinator to follow up.  Delice Lesch, MD, Mellody Drown Bary L[place], [place] 06/14/2017    Revision History                        Routing History

## 2017-06-18 NOTE — H&P (Signed)
Physical Medicine and Rehabilitation Admission H&P     Chief Complaint  Patient presents with  . decline in mobility and ability to carry out ADL tasks.   . stroke with right sided weakness, dysphagia and cognitive deficits.   HPI: [name] is a 66 y.o. 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 She was sent to the ED for full work up. History taken from chart review. Carotid dopplers done revealing L-40-59% ICA stenosis. Cardiac echo revealed EF 60-65% with no wall abnormality and grade 1 diastolic dysfunction. CTA head/neck done revealing large non-calcified plaque in proximal L-ICA causing 60-65% stenosis and likely source of emboli.  She underwent cerebral angiogram for work up on 8/16 revealing < 50% L-ICA stenosis and during the procedure became unresponsive with bilateral UE tonic posturing and left sided weakness. She was taken to IR for endovascualr revascularization of B-MCA occlusions with TICI reperfusion bilaterally. Dr. Erlinda Hong recommended DAPT x 3 months followed by Plavix alone. EEG revealed mild generalized dysfunction and intermittent BUE twitching not associated with epileptiform activity. Follow up MRI brain reviewed, showing multiple infarcts. Per report, numerous acute new supra and infratentorial infarcts involving B-MCA, and bilateral PCA- no hemorrhage. She was briefly extubated on 8/20 but developed stridor with increase WOB requiring reintubation and beside tracheostomy on 8/23 by Dr. Nelda Marseille.  She was extubated to ATC and downsized to CFS #6 on 8/29. She is tolerating PMSV for 20 minutes with imporved phonation and breath support. She has had issues with copious secretions and mucomyst added. Tracheal secretion positive for MSSA and she was started on cefazolin 8/22 for  treatment of MSSA bronchitis v/s PNA. She did have drop in H/H to 7.6 and transfused with one unit PRBC on 8/29. MBS done 8/30 showing dysphagia due to sensorimotor deficits and silent aspiration during swallow and remains NPO but to start therapeutic trials with ST. She is showing improvement in participation with encouragement and is tolerating activity at EOB. CIR recommended due to decline in mobility and ability to carry out ADL tasks.  Review of Systems  HENT: Positive for ear pain. Negative for hearing loss and tinnitus.  Eyes: Negative for blurred vision and double vision.  Respiratory: Positive for cough. Negative for shortness of breath.  Cardiovascular: Negative for chest pain, palpitations and leg swelling.  Gastrointestinal: Positive for heartburn. Negative for constipation and nausea.  Genitourinary: Negative for dysuria and urgency.  Musculoskeletal: Positive for myalgias. Negative for back pain and joint pain.  Skin: Negative for itching and rash.  Neurological: Positive for sensory change, speech change, focal weakness, weakness and headaches.  Psychiatric/Behavioral: Positive for memory loss. The patient is nervous/anxious and has insomnia (due to HA).       Past Medical History:  Diagnosis Date  . Anemia    takes iron supplement  . Anesthesia complication    disorientation due to pseudotumor  . Diabetes mellitus    IDDM  . Family history of anesthesia complication 69 yrs ago   brother stopped breathing for a minute or two  . GERD (gastroesophageal reflux disease)   . Headache(784.0)    due to pseudotumor; daily headache  . Hypercholesteremia    unable to tolerate statins  . Hypertension    under control with meds., has been on med. x 5 yr.  Marland Kitchen  Peripheral neuropathy   . PONV (postoperative nausea and vomiting)   . 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 Seizures ([place])    due to shunt failure; last seizure 2007  . Synovitis of ankle 04/2013   left    . Wears dentures    full        Past Surgical History:  Procedure Laterality Date  . ANKLE ARTHROSCOPY Left 05/18/2013   Procedure: LEFT ANKLE ARTHROSCOPY WITH DEBRIDEMENT, SUBTALAR OPEN DEBRIDEMENT ; Surgeon: Wylene Simmer, MD; Location: [facility]; Service: Orthopedics; Laterality: Left;  . APPENDECTOMY     as a child  . BALLOON DILATION N/A 07/04/2013   Procedure: BALLOON DILATION; Surgeon: Garlan Fair, MD; Location: [facility]; Service: Endoscopy; Laterality: N/A;  . BLADDER SUSPENSION    . BREAST LUMPECTOMY W/ NEEDLE LOCALIZATION Left 05/22/2011  . CAROTID ANGIOGRAPHY Bilateral 06/03/2017   Procedure: Bilateral Carotid Angiography; Surgeon: Conrad Ranburne, MD; Location: [facility]; Service: Cardiovascular; Laterality: Bilateral;  . COLONOSCOPY WITH PROPOFOL N/A 05/14/2014   Procedure: COLONOSCOPY WITH PROPOFOL; Surgeon: Garlan Fair, MD; Location: [facility]; Service: Endoscopy; Laterality: N/A;  . ESOPHAGEAL DILATION  06/23/2006; 08/05/2004  . ESOPHAGOGASTRODUODENOSCOPY N/A 07/04/2013   Procedure: ESOPHAGOGASTRODUODENOSCOPY (EGD); Surgeon: Garlan Fair, MD; Location: [facility]; Service: Endoscopy; Laterality: N/A;  . ESOPHAGOGASTRODUODENOSCOPY (EGD) WITH PROPOFOL N/A 05/14/2014   Procedure: ESOPHAGOGASTRODUODENOSCOPY (EGD) WITH PROPOFOL; Surgeon: Garlan Fair, MD; Location: [facility]; Service: Endoscopy; Laterality: N/A;  . IR ANGIO VERTEBRAL SEL SUBCLAVIAN INNOMINATE UNI R MOD SED  06/03/2017  . IR PERCUTANEOUS ART THROMBECTOMY/INFUSION INTRACRANIAL INC DIAG ANGIO  06/03/2017  . IR PERCUTANEOUS ART THROMBECTOMY/INFUSION INTRACRANIAL INC DIAG ANGIO  06/03/2017  . LUMBAR PERITONEAL SHUNT     x 2  . OVARIAN CYST REMOVAL     age 75  . RADIOLOGY WITH ANESTHESIA N/A 06/03/2017   Procedure: RADIOLOGY WITH ANESTHESIA; Surgeon: Luanne Bras, MD; Location: [facility]; Service: Radiology; Laterality: N/A;  . SHUNT REVISION  2007  . TOTAL ABDOMINAL  HYSTERECTOMY W/ BILATERAL SALPINGOOPHORECTOMY  1994   age 25s  . WRIST SURGERY Left 2014   [name]        Family History  Problem Relation Age of Onset  . Heart disease Father   . Migraines Neg Hx    Social History: Married--lives with family. Husband works part time. Independent with AD in the home. Per reports that she has never smoked. She has never used smokeless tobacco. Per reports she does not drink alcohol or use drugs.       Allergies  Allergen Reactions  . Carbamazepine Hives, Shortness Of Breath and Other (See Comments)  . Penicillins Anaphylaxis and Other (See Comments)    Mother, father and brother have history of anaphylaxis reaction to penicillin so pt does not take   . Statins Other (See Comments)    Severe muscle weakness, leg numbness, severe headaches, chest pain   . Tricyclic Antidepressants Other (See Comments)    IMPAIRED MEMORY  . Atorvastatin Other (See Comments)    Severe muscle weakness, leg numbness, severe headaches, chest pain   . Ezetimibe Other (See Comments)    Severe muscle weakness, leg numbness, severe headaches, chest pain   . Nortriptyline Other (See Comments)    IMPAIRED MEMORY  . Quinapril Hcl Other (See Comments)    Unknown reaction  . Rifampin Diarrhea  . Metoclopramide Nausea And Vomiting and Rash         Medications Prior  to Admission  Medication Sig Dispense Refill  . aspirin EC 81 MG tablet Take 81 mg by mouth daily.    . cloNIDine (CATAPRES) 0.3 MG tablet Take 0.3 mg by mouth 2 (two) times daily.     . diclofenac sodium (VOLTAREN) 1 % GEL Apply 2 application topically 4 (four) times daily as needed (pain).     Marland Kitchen esomeprazole (NEXIUM) 40 MG capsule Take 40 mg by mouth daily as needed (acid reflux). Acid reflux    . estropipate (OGEN) 1.5 MG tablet Take 0.75 mg by mouth daily.     . furosemide (LASIX) 20 MG tablet Take 20 mg by mouth daily.    Marland Kitchen gabapentin (NEURONTIN) 100 MG capsule Take 100 mg by mouth at bedtime.    Marland Kitchen  glipiZIDE (GLUCOTROL) 10 MG tablet Take 10 mg by mouth 2 (two) times daily before a meal.     . insulin glargine (LANTUS) 100 unit/mL SOPN Inject 32 Units into the skin at bedtime.    . Melatonin 10 MG TABS Take 10 mg by mouth at bedtime as needed (sleep).    . metFORMIN (GLUCOPHAGE-XR) 500 MG 24 hr tablet Take 1,000 mg by mouth 3 (three) times daily with meals.    . metoprolol (LOPRESSOR) 50 MG tablet Take 50 mg by mouth at bedtime.     . metoprolol (TOPROL-XL) 100 MG 24 hr tablet Take 100 mg by mouth daily with breakfast.     . PRESCRIPTION MEDICATION Take 20 mg by mouth See admin instructions. Domperidone 10 mg from [place]: Take 2 tablets (20 mg) by mouth three times daily with meals - for diabetic gastroparesis    . telmisartan (MICARDIS) 80 MG tablet Take 80 mg by mouth at bedtime.     Marland Kitchen acetaZOLAMIDE (DIAMOX) 250 MG tablet Take 1 tablet (250 mg total) by mouth 3 (three) times daily. (Patient not taking: Reported on 05/31/2017) 90 tablet 12   Home:  Home Living  Family/patient expects to be discharged to:: Private residence  Living Arrangements: Children, Spouse/significant other  Available Help at Discharge: Family, Available 24 hours/day  Type of Home: House  Home Access: Stairs to enter  [employer] of Steps: 4  Entrance Stairs-Rails: Right, Left, Can reach both  Home Layout: One level  Bathroom Shower/Tub: Tourist information centre manager: Handicapped height  Home Equipment: Grab bars - tub/shower, Grab bars - toilet, Shower seat, Radio producer - single point, Environmental consultant - 4 wheels  Lives With: Spouse  Functional History:  Prior Function  Level of Independence: Independent (intermittent use of rollator walker in the community )  Gait / Transfers Assistance Needed: occasionally uses rollator  ADL's / Homemaking Assistance Needed: Pt reports she completes ADLs independently approx 90-95% of the time; intermittently needs assistance/supervision for bathing/dressing ADLs  Functional  Status:  Mobility:  Bed Mobility  Overal bed mobility: Needs Assistance  Bed Mobility: Rolling, Sidelying to Sit  Rolling: Min assist  Sidelying to sit: Min assist, +2 for safety/equipment  Supine to sit: Min assist  Sit to supine: Max assist  General bed mobility comments: cues for sequence with segmental function of rolling, legs off then trunk up with min assist for legs and trunk with +2 for safety  Transfers  Overall transfer level: Needs assistance  Equipment used: None  Transfers: Sit to/from Stand, [employer] Transfers  Sit to Stand: Max assist, +2 physical assistance  Squat pivot transfers: Total assist, +2 physical assistance  General transfer comment: attempted standing x 3 with max  assist for anterior translation and rise with pad, belt and knees blocked. RLE buckling with assist to maintain extension. total assist to move pelvis and pivot to chair  Ambulation/Gait  Ambulation/Gait assistance: Supervision  Ambulation Distance (Feet): 210 Feet  Assistive device: None  Gait Pattern/deviations: Step-through pattern, Decreased stride length, Drifts right/left, Narrow base of support  General Gait Details: Unable  Gait velocity: decreased  Gait velocity interpretation: Below normal speed for age/gender   ADL:  ADL  Overall ADL's : Needs assistance/impaired  Eating/Feeding: NPO  Grooming: Wash/dry face, Bed level, Set up  Grooming Details (indicate cue type and reason): sink level  Upper Body Bathing: Min guard, Sitting  Lower Body Bathing: Min guard, Sit to/from stand  Upper Body Dressing : Min guard, Sitting  Lower Body Dressing: Total assistance, Bed level  Lower Body Dressing Details (indicate cue type and reason): to don socks  Toilet Transfer: Total assistance, +2 for physical assistance, Scientist, water quality Details (indicate cue type and reason): Simulated by transfer EOB>chair  Toileting- Clothing Manipulation and Hygiene: Modified independent, Sit  to/from stand  Toileting - Clothing Manipulation Details (indicate cue type and reason): hospital gown and peri care  Functional mobility during ADLs: Total assistance, +2 for physical assistance (squat pivot only)  General ADL Comments: tolerated sitting EOB without support x 5 minutes, then asking to return to supine  Cognition:  Cognition  Overall Cognitive Status: Impaired/Different from baseline  Arousal/Alertness: Awake/alert  Orientation Level: Oriented X4  Awareness: Impaired  Awareness Impairment: Intellectual impairment  Problem Solving: Impaired  Problem Solving Impairment: Functional basic, Verbal basic  Behaviors: Poor frustration tolerance  Cognition  Arousal/Alertness: Awake/alert  Behavior During Therapy: Flat affect  Overall Cognitive Status: Impaired/Different from baseline  Area of Impairment: Following commands, Safety/judgement, Problem solving  Following Commands: Follows one step commands inconsistently, Follows one step commands with increased time  Safety/Judgement: Decreased awareness of deficits  Problem Solving: Slow processing, Decreased initiation, Requires verbal cues, Requires tactile cues, Difficulty sequencing  General Comments: diffucult to understand speech at this time with PMSV  Difficult to assess due to: Tracheostomy  Blood pressure (!) 142/80, pulse 77, temperature 98.2 F (36.8 C), temperature source Oral, resp. rate 17, height 5\' 4"  (1.626 m), weight 76.4 kg (168 lb 6.9 oz), SpO2 100 %.  Physical Exam  Nursing note and vitals reviewed.  Constitutional: She is oriented to person, place, and time. She appears well-developed and well-nourished. She appears lethargic. She is easily aroused. No distress.  HENT:  Head: Normocephalic and atraumatic.  Mouth/Throat: Oropharynx is clear and moist.  Eyes: Pupils are equal, round, and reactive to light. Conjunctivae and EOM are normal.  Neck: Normal range of motion.  CFS #6 in place with brownish  secretions in flange.  Cardiovascular: Normal rate and regular rhythm.  Respiratory: Effort normal. No stridor. She has rhonchi.  GI: Soft. Bowel sounds are normal.  Musculoskeletal: She exhibits no edema or tenderness.  Neurological: She is oriented to person, place, and time and easily aroused. She appears lethargic.  Sleepy and kept falling asleep. Left facial droop with dysarthric speech. Able to phonate with PMSV. Needed max cues to recall DOB/age. Month "April". Some difficulty with simple one step commands--question component of apraxia. Distracted with perseverative behaviors. She lacks insight or awareness of deficits. Right sided weakness with decrease in motor control. RUE grossly 2-3/5. RLE 3-4/5. Moves left side more spontaneously  Skin: Skin is warm and dry. She is not diaphoretic.  Psychiatric: Her affect  is blunt. Her speech is delayed. She is slowed. Cognition and memory are impaired. She expresses inappropriate judgment. She is inattentive.   Lab Results Last 48 Hours  Imaging Results (Last 48 hours)     Medical Problem List and Plan:  1. 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 DVT Prophylaxis/Anticoagulation: Pharmaceutical: Lovenox  3. Headaches/Pain Management: Off IV fentanyl since yesterday. May need topamax to help with constant HA.  4. Mood: LCSW to follow for evaluation and support.  5. Neuropsych: This patient is not capable of making decisions on her own behalf.  6. Skin/Wound Care: Routine pressure relief measures.  7. Fluids/Electrolytes/Nutrition: Monitor I/O. Check lytes in am.  8. MSSA PNA: Completed treatment 7 day course of cefazolin on 8/30. Continue to monitor with strict aspiration precautions--HOB > 30 degrees at all times.  9. VDRF: Tolerating PMSV with supervision. Secretions felt to be in part due to GERD.  10 HTN: Monitor BP bid--on lopressor bid and catapres,  11. ABLA: Improved with transfusion. Will recheck CBC in am.  12. Anxiety disorder:Team to provide ego support. Daughter staying to encourage patient. Palliative following for support and placed on IV ativan tid for symptoms management--decrease to prn due to sedation.  13. T2DM: Hgb A1C- Continue to titrated lantus insulin for tighter control. Monitor BS ac/hs and use SSI for elevations.  14. GERD: on pepcid and domperitone  15. Gastorparesis: On Domperidone tid-0-change to ac  16. Leucocytosis: persistent .Monitor for signs of infection.  17. Pseudotumor Cerebri: Diamox resumed  18. B-MCA stenosis/L-ICA stenosis s/p revascularization: On ASA/Plavix.  63. Acute urinary retention: Continue foley for next few days to allow for acclimatization to unit. Then will  Post Admission Physician Evaluation:  1. Functional deficits secondary to left CVA/debility. 2. Patient is admitted to receive  collaborative, interdisciplinary care between the physiatrist, rehab nursing staff, and therapy team. 3. Patient's level of medical complexity and substantial therapy needs in context of that medical necessity cannot be provided at a lesser intensity of care such as a SNF. 4. Patient has experienced substantial functional loss from his/her baseline which was documented above under the "Functional History" and "Functional Status" headings. Judging by the patient's diagnosis, physical exam, and functional history, the patient has potential for functional progress which will result in measurable gains while on inpatient rehab. These gains will be of substantial and practical use upon discharge in facilitating mobility and self-care at the household level. 5. Physiatrist will provide 24 hour management of medical needs as well as oversight of the therapy plan/treatment and provide guidance as appropriate regarding the interaction of the two. 6. The Preadmission Screening has been reviewed and patient status is unchanged unless otherwise stated above. 7. 24 hour rehab nursing will assist with bladder management, bowel management, safety, skin/wound care, disease management, medication administration, pain management and patient education and help integrate therapy concepts, techniques,education, etc. 8. PT will assess and treat for/with: Lower extremity strength, range of motion, stamina, balance, functional mobility, safety, adaptive techniques and equipment, NMR, family education. Goals are: min to mod assist. 9. OT will assess and treat for/with: ADL's, functional mobility, safety, upper extremity strength, adaptive techniques and equipment, NMR, family ed, ego support. Goals are: min to mod assist. Therapy may not yet proceed with showering this patient. 10. SLP will assess and treat for/with: cognition, swallowing speech, family ed. Goals are: mod assist. 11. Case Management and Social Worker will assess  and treat for psychological issues and discharge planning. 12. Team conference will be held weekly to assess progress toward goals and to determine barriers to  discharge. 13. Patient will receive at least 3 hours of therapy per day at least 5 days per week. 14. ELOS: 22-27 days  15. Prognosis: good Meredith Staggers, MD, [facility]  06/18/2017  Bary Leriche, Hershal Coria  06/18/2017

## 2017-06-18 NOTE — Progress Notes (Signed)
  Speech Language Pathology Treatment: Dysphagia;Passy Muir Speaking valve  Patient Details Name: [name] MRN: [id number] DOB: 22-Jan-1951 Today's Date: 06/18/2017 Time: 1050-1106 SLP Time Calculation (min) (ACUTE ONLY): 16 min  Assessment / Plan / Recommendation Clinical Impression  Pt in chair after sessions with PT, OT.  PMV placed; pt achieved low volume, hoarse phonation with mod cues for intelligibility.  Asking for POs, and her daughter wanted info re: results of yesterday's MBS.  Video of study accessed and shown to daughter; we reviewed results/recommendations, including the necessity of remaining NPO for now, with therapeutic POs per SLP.  Pt consumed five boluses of applesauce with great pleasure, no overt indication of difficulty, valve in place with min cues for effortful swallow, throat clear.  SLP will continue to follow.   HPI HPI: 66yo female with hx HTN, DM who presented initially 8/13 to her PCP with headache, R sided weakness and vision changes. She was sent for MRI which revealed multiple areas of acute L MCA infarct. She was sent to ER and admitted for CVA workup. On 8/16 pt was in cath lab for diagnostic carotid and cerebral angiogram with vascular surgery when she became suddenly unresponsive with L sided weakness and possible L sided seizure-like activity. CTA revealed bilateral M1 occlusions. She was intubated and taken urgently to IR for revascularization and rapid resolution of embolism. ETT 8/16-8/20, 8/20-8/23, trach 8/23, pt tolerated trach collar overnight.      SLP Plan  Continue with current plan of care       Recommendations  Diet recommendations: NPO      Patient may use Passy-Muir Speech Valve: Intermittently with supervision;During all therapies with supervision PMSV Supervision: Full         Oral Care Recommendations: Oral care QID SLP Visit Diagnosis: Dysphagia, pharyngeal phase (R13.13) Plan: Continue with current plan of  care       GO                Juan Quam Laurice 06/18/2017, 11:11 AM

## 2017-06-18 NOTE — Progress Notes (Addendum)
Inpatient Rehabilitation  I have insurance authorization and await MD medical clearance prior to potential IP Rehab admission today.  Plan to update the team when I know.  Please call with questions.   Update: I have received medical clearance and will proceed with admitting patient to IP Rehab today.  Please call with questions.   Carmelia Roller., CCC/SLP Admission Coordinator  [place]  Cell [phone]

## 2017-06-18 NOTE — Care Management Note (Signed)
Case Management Note  Patient Details  Name: [name] MRN: [id number] Date of Birth: 05/16/1951  Subjective/Objective:  Bil MCA, acute thrombus s/p thrombectomy, acute hypoxic resp failure, acute encephalopathy , trach collar at 28%, downsized to cuffless yesterday, will start pmv trials today, once tolerating will start capping trials and move toward decannulation.  conts on ivf's , iv pain meds, plan for CIR. Discussed in LOS, appropriate for continue stay.    8/31 [place], BSN - patient for dc to CIR today.                                Action/Plan: NCM will follow for dc needs.   Expected Discharge Date:  06/18/17               Expected Discharge Plan:  [place]  In-House Referral:     Discharge planning Services  CM Consult  Post Acute Care Choice:    Choice offered to:     DME Arranged:    DME Agency:     HH Arranged:    HH Agency:     Status of Service:  Completed, signed off  If discussed at [employer], dates discussed:    Additional Comments:  Zenon Mayo, RN 06/18/2017, 3:26 PM

## 2017-06-18 NOTE — Progress Notes (Signed)
[facility] Admission Coordinator Signed Physical Medicine and Rehabilitation  PMR Pre-admission Date of Service: 06/17/2017 2:44 PM  Related encounter: ED to Hosp-Admission (Current) from 05/31/2017 in [facility]       _0 Hide copied text PMR Admission Coordinator Pre-Admission Assessment  Patient: [name] is an 66 y.o., female MRN: [id number] DOB: 04/20/1951 Height: _1  (162.6 cm) Weight: 76.4 kg (168 lb 6.9 oz)                                                                                                                                                  Insurance Information HMO: X    PPO:      PCP:      IPA:      80/20:      OTHER:  PRIMARY: UHC Medicare       Policy#: [id number]      Subscriber: Self CM Name: Vevelyn Royals       Phone#: [phone]     Fax#: [phone] Pre-Cert#: [id number]      Employer: Disabled  Benefits:  Phone #: Verified online     Name: [place]. Date: 10/19/16     Deduct: $0      Out of Pocket Max: [phone]      Life Max: N/A CIR: $430 a day, days 1-4; $0 a day, days 5+      SNF: $0 a day, days 1-20; $160 a day, days 21-62; $0 a day, days 63-100 Outpatient: PT/OT/SLP     Co-Pay: $40 per visit  Home Health: 100%      Co-Pay: None DME: 805     Co-Pay: 20% Providers: In-network  SECONDARY: None      Policy#:       Subscriber:  CM Name:       Phone#:      Fax#:  Pre-Cert#:       Employer:  Benefits:  Phone #:      Name:  Eff. Date:      Deduct:       Out of Pocket Max:       Life Max:  CIR:       SNF:  Outpatient:      Co-Pay:  Home Health:       Co-Pay:  DME:      Co-Pay:   Medicaid Application Date:       Case Manager:  Disability Application Date:       Case Worker:   Emergency Contact Information        Contact Information    Name Relation Home Work Mobile   Ol[place] Spouse [phone]  [phone]   Waight,Rebecca Daughter [phone]     Current Medical  History  Patient Admitting Diagnosis: Bilateral strokes  History of Present Illness: Morgan C Holstonis a  66 y.o.femalewith history of DM, HTN, peripheral neuropathy, pseudotumor cerebri, seizure disorder, one week history of right hand weakness, and HA who had MRI brain done 05/30/17 revealing acute infarcts in left parietal cortex, white matter and occipital lobe. She was sent to the ED for full work up. History taken from chart review. Carotid dopplers done revealing L-40-59% ICA stenosis. Cardiac echo revealed EF 60-65% with no wall abnormality and grade 1 diastolic dysfunction. CTA head/neck done revealing large non-calcified plaque in proximal L-ICA causing 60-65% stenosis and likely source of emboli.   She underwent cerebral angiogram for work up on 8/16 revealing <50% L-ICA stenosis and during the procedure became unresponsive with bilateral UE tonic posturing and left sided weakness. She was taken to IR for endovascualr revascularization of B-MCA occlusions with TICI reperfusion bilaterally. Dr. Erlinda Hong recommended DAPT x 3 months followed by Plavix alone. EEG revealed mild generalized dysfunction and intermittent BUE twitching not associated with epileptiform activity. Follow up MRI brain reviewed, showing multiple infarcts. Per report, numerous acute new supra and infratentorial infarcts involving B-MCA, and bilateral PCA- no hemorrhage. She was briefly extubated on 06/07/17 but developed stridor with increase WOB requiring reintubation and beside tracheostomy on 06/10/17 by Dr. Nelda Marseille.   She was extubated to ATC and downsized to CFS #6 on 06/16/17. She is tolerating PMSV for 20 minutes with imporved phonation and breath support. She has had issues with copious secretions and mucomyst added. Tracheal secretion positive for MSSA and she was started on cefazolin 06/09/17 for treatment of MSSA bronchitis v/s PNA. She did have drop in H/H to 7.6 and transfused with one unit PRBC on 06/16/17. MBS done  06/17/17 showing dysphagia due to sensorimotor deficits and silent aspiration during swallow and remains NPO but to start therapeutic trials with SLP. She is showing improvement in participation with encouragement and is tolerating activity at EOB as well as to chair transfers. CIR recommended due to functional decline in mobility and ability to carry out ADL tasks. Patient admitted to [facility] 06/18/17.   NIH Total: 2  Past Medical History      Past Medical History:  Diagnosis Date  . Anemia    takes iron supplement  . Anesthesia complication    disorientation due to pseudotumor  . Diabetes mellitus    IDDM  . Family history of anesthesia complication 64 yrs ago   brother stopped breathing for a minute or two  . GERD (gastroesophageal reflux disease)   . Headache(784.0)    due to pseudotumor; daily headache  . Hypercholesteremia    unable to tolerate statins  . Hypertension    under control with meds., has been on med. x 5 yr.  . Peripheral neuropathy   . PONV (postoperative nausea and vomiting)   . 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 Seizures ([place])    due to shunt failure; last seizure 2007  . Synovitis of ankle 04/2013   left  . Wears dentures    full    Family History  family history includes Heart disease in her father.  Prior Rehab/Hospitalizations:  Has the patient had major surgery during 100 days prior to admission? No  Current Medications   Current Facility-Administered Medications:  .  0.9 %  sodium chloride infusion, , Intravenous, Continuous, Cherene Altes, MD, Last Rate: 30 mL/hr at 06/18/17 0132 .  [DISCONTINUED] acetaminophen (TYLENOL) tablet 650 mg, 650 mg, Oral, Q4H PRN **OR** acetaminophen (TYLENOL) solution 650 mg, 650 mg,  Per Tube, Q4H PRN, 650 mg at 06/15/17 1740 **OR** acetaminophen (TYLENOL) suppository 650 mg, 650 mg, Rectal, Q4H PRN, Deveshwar, Sanjeev, MD .   acetaZOLAMIDE (DIAMOX) tablet 250 mg, 250 mg, Per Tube, TID, Cherene Altes, MD, 250 mg at 06/18/17 1100 .  albuterol (PROVENTIL) (2.5 MG/3ML) 0.083% nebulizer solution 2.5 mg, 2.5 mg, Nebulization, Q4H PRN, Dhungel, Nishant, MD .  aspirin tablet 325 mg, 325 mg, Per Tube, Daily, Colvard, Tyler, RPH, 325 mg at 06/18/17 1100 .  chlorhexidine gluconate (MEDLINE KIT) (PERIDEX) 0.12 % solution 15 mL, 15 mL, Mouth Rinse, BID, Ashok Cordia, Sonia Baller, MD, Stopped at 06/18/17 [phone] .  Chlorhexidine Gluconate Cloth 2 % PADS 6 each, 6 each, Topical, Daily, Cherene Altes, MD, 6 each at 06/18/17 1100 .  ciprofloxacin-dexamethasone (CIPRODEX) 0.3-0.1 % OTIC (EAR) suspension 4 drop, 4 drop, Right EAR, BID, Dhungel, Nishant, MD, 4 drop at 06/18/17 1100 .  cloNIDine (CATAPRES - Dosed in mg/24 hr) patch 0.3 mg, 0.3 mg, Transdermal, Weekly, Collene Gobble, MD, 0.3 mg at 06/16/17 1233 .  clopidogrel (PLAVIX) tablet 75 mg, 75 mg, Per Tube, Q breakfast, Colvard, Tyler, RPH, 75 mg at 06/18/17 0837 .  diclofenac sodium (VOLTAREN) 1 % transdermal gel 2 g, 2 g, Topical, QID PRN, Jani Gravel, MD, 2 g at 06/01/17 1057 .  diphenhydrAMINE (BENADRYL) injection 25 mg, 25 mg, Intravenous, Q6H PRN, Opyd, Ilene Qua, MD, 25 mg at 06/18/17 0127 .  docusate (COLACE) 50 MG/5ML liquid 100 mg, 100 mg, Oral, Daily, Parrett, Tammy S, NP, 100 mg at 06/18/17 1100 .  Domperidone 20 mg (2 x 10 mg tablets) - patient's own supply, 20 mg, Per Tube, TID, Hammons, Kimberly B, RPH, 20 mg at 06/18/17 1100 .  enoxaparin (LOVENOX) injection 40 mg, 40 mg, Subcutaneous, Q24H, Conrad Bellewood, MD, 40 mg at 06/18/17 0835 .  estropipate (OGEN) tablet 0.75 mg, 0.75 mg, Per Tube, Daily, Joette Catching T, MD, 0.75 mg at 06/18/17 1100 .  famotidine (PEPCID) IVPB 20 mg premix, 20 mg, Intravenous, Q24H, Javier Glazier, MD, Stopped at 06/17/17 1644 .  feeding supplement (PRO-STAT SUGAR FREE 64) liquid 30 mL, 30 mL, Per Tube, Daily, Allie Bossier, MD, 30 mL  at 06/18/17 1100 .  feeding supplement (VITAL 1.5 CAL) liquid 1,000 mL, 1,000 mL, Per Tube, Continuous, Allie Bossier, MD, Last Rate: 50 mL/hr at 06/18/17 0138, 1,000 mL at 06/18/17 0138 .  ibuprofen (ADVIL,MOTRIN) 100 MG/5ML suspension 600 mg, 600 mg, Per Tube, Q8H PRN, Joette Catching T, MD .  insulin aspart (novoLOG) injection 0-15 Units, 0-15 Units, Subcutaneous, Q4H, Mannam, Praveen, MD, 4 Units at 06/18/17 [phone] .  insulin aspart (novoLOG) injection 4 Units, 4 Units, Subcutaneous, Q4H, Erick Colace, NP, 4 Units at 06/18/17 [phone] .  insulin glargine (LANTUS) injection 17 Units, 17 Units, Subcutaneous, Daily, Allie Bossier, MD, 17 Units at 06/18/17 1100 .  labetalol (NORMODYNE,TRANDATE) injection 10 mg, 10 mg, Intravenous, Q10 min PRN, Patteson, Samuel A, NP, 10 mg at 06/07/17 2140 .  LORazepam (ATIVAN) injection 1 mg, 1 mg, Intravenous, Q8H, Knox Royalty, NP, 1 mg at 06/18/17 [phone] .  MEDLINE mouth rinse, 15 mL, Mouth Rinse, QID, Ashok Cordia Sonia Baller, MD, 15 mL at 06/17/17 1848 .  metoprolol tartrate (LOPRESSOR) tablet 100 mg, 100 mg, Per Tube, BID, Collene Gobble, MD, 100 mg at 06/18/17 1100 .  morphine 4 MG/ML injection 1-2 mg, 1-2 mg, Intravenous, Q3H PRN, Cherene Altes, MD .  ondansetron [facility]) injection  4 mg, 4 mg, Intravenous, Q6H PRN, Dhungel, Nishant, MD, 4 mg at 06/14/17 1220 .  pantoprazole (PROTONIX) EC tablet 40 mg, 40 mg, Oral, Daily PRN, Jani Gravel, MD .  phenol [facility]) mouth spray 1 spray, 1 spray, Mouth/Throat, PRN, Allie Bossier, MD, 1 spray at 06/13/17 2312 .  sodium chloride flush (NS) 0.9 % injection 10-40 mL, 10-40 mL, Intracatheter, Q12H, Cherene Altes, MD, 10 mL at 06/17/17 2355 .  sodium chloride flush (NS) 0.9 % injection 10-40 mL, 10-40 mL, Intracatheter, PRN, Cherene Altes, MD .  traMADol [facility]at[facility]bon) tablet 50 mg, 50 mg, Per Tube, Q6H PRN, Cherene Altes, MD, 50 mg at 06/18/17 0127  Patients Current Diet: Diet NPO time  specified  Precautions / Restrictions Precautions Precautions: Fall Precaution Comments: cortrak, trach Restrictions Weight Bearing Restrictions: No   Has the patient had 2 or more falls or a fall with injury in the past year?Yes, 3 falls in August  Prior Activity Level Community (5-7x/wk): Prior to admission patient was retired/disabled and did not drive.  However, she was fully independent with self-care, household, and money management tasks.    Home Assistive Devices / Equipment Home Assistive Devices/Equipment: None Home Equipment: Grab bars - tub/shower, Grab bars - toilet, Shower seat, Cane - single point, Walker - 4 wheels  Prior Device Use: Indicate devices/aids used by the patient prior to current illness, exacerbation or injury? Intermittent use of Rollator walker out in the community  Prior Functional Level Prior Function Level of Independence: Independent (intermittent use of rollator walker in the community ) Gait / Transfers Assistance Needed: occasionally uses rollator  ADL's / Homemaking Assistance Needed: Pt reports she completes ADLs independently approx 90-95% of the time; intermittently needs assistance/supervision for bathing/dressing ADLs  Self Care: Did the patient need help bathing, dressing, using the toilet or eating? Independent  Indoor Mobility: Did the patient need assistance with walking from room to room (with or without device)? Independent  Stairs: Did the patient need assistance with internal or external stairs (with or without device)? Independent  Functional Cognition: Did the patient need help planning regular tasks such as shopping or remembering to take medications? Independent  Current Functional Level Cognition  Arousal/Alertness: Awake/alert Overall Cognitive Status: Impaired/Different from baseline Difficult to assess due to: Tracheostomy Orientation Level: Oriented X4 Following Commands: Follows one step commands  inconsistently, Follows one step commands with increased time Safety/Judgement: Decreased awareness of deficits General Comments: difficult to understand speech at this time with PMSV  Awareness: Impaired Awareness Impairment: Intellectual impairment Problem Solving: Impaired Problem Solving Impairment: Functional basic, Verbal basic Behaviors: Poor frustration tolerance    Extremity Assessment (includes Sensation/Coordination)  Upper Extremity Assessment: RUE deficits/detail, LUE deficits/detail RUE Deficits / Details: Pt self limiting AROM in RUE due to pain at IV site with increased movement, shoulder abduction appears approx 3+/5, decreased grip strength; sensation appears intact to light touch RUE Coordination: decreased fine motor, decreased gross motor LUE Deficits / Details: LUE shoulder abduction approx 4/5, decreased AROM in shoulder flexion however is [facility]   Lower Extremity Assessment: Defer to PT evaluation    ADLs  Overall ADL's : Needs assistance/impaired Eating/Feeding: NPO Grooming: Wash/dry hands, Wash/dry face, Oral care, Sitting, Minimal assistance Grooming Details (indicate cue type and reason): in recliner, suction oral care kit used to prevent aspiration; Pt uing LUE as primary - encouraging use of RUE throughout Upper Body Bathing: Min guard, Sitting Lower Body Bathing: Min guard, Sit to/from stand Upper Body Dressing :  Min guard, Sitting Lower Body Dressing: Total assistance, Bed level Lower Body Dressing Details (indicate cue type and reason): to don socks Toilet Transfer: Total assistance, +2 for physical assistance, Control and instrumentation engineer Details (indicate cue type and reason): Simulated by transfer EOB>chair Toileting- Clothing Manipulation and Hygiene: Modified independent, Sit to/from stand Toileting - Clothing Manipulation Details (indicate cue type and reason): hospital gown and peri care Functional mobility during ADLs: Total assistance, +2 for  physical assistance (squat pivot only) General ADL Comments: use of PSMV during session     Mobility  Overal bed mobility: Needs Assistance Bed Mobility: Rolling, Sidelying to Sit Rolling: Min assist Sidelying to sit: Min assist, +2 for safety/equipment Supine to sit: Min assist Sit to supine: Max assist General bed mobility comments: cues for sequence with segmental function of rolling, legs off then trunk up with min assist for legs and trunk with +2 for safety    Transfers  Overall transfer level: Needs assistance Equipment used: None Transfers: Sit to/from Stand, [employer] Transfers Sit to Stand: Max assist, +2 physical assistance Squat pivot transfers: Total assist, +2 physical assistance General transfer comment: attempted standing x 3 with max assist for anterior translation and rise with pad, belt and knees blocked. RLE buckling with assist to maintain extension. total assist to move pelvis and pivot to chair    Ambulation / Gait / Stairs / Wheelchair Mobility  Ambulation/Gait Ambulation/Gait assistance: Supervision Ambulation Distance (Feet): 210 Feet Assistive device: None Gait Pattern/deviations: Step-through pattern, Decreased stride length, Drifts right/left, Narrow base of support General Gait Details: Unable Gait velocity: decreased Gait velocity interpretation: Below normal speed for age/gender    Posture / Balance Dynamic Sitting Balance Sitting balance - Comments: pt EOB 12 min with varied min to minguard. With assist for hand placement and verbal cues for position pt able to maintain static sitting grossly 30 sec at a time Balance Overall balance assessment: Needs assistance Sitting-balance support: Feet supported, Bilateral upper extremity supported Sitting balance-Leahy Scale: Poor Sitting balance - Comments: pt EOB 12 min with varied min to minguard. With assist for hand placement and verbal cues for position pt able to maintain static sitting  grossly 30 sec at a time Postural control: Posterior lean, Right lateral lean Standing balance support: Bilateral upper extremity supported Standing balance-Leahy Scale: Zero Standing balance comment: knees blocked, pt maintains flexed posture  High level balance activites: Backward walking, Direction changes, Turns, Head turns High Level Balance Comments: supervision this session    Special needs/care consideration BiPAP/CPAP: No CPM: No Continuous Drip IV: No Dialysis: No         Life Vest: No Oxygen: Yes Special Bed: ICU bed at this time  Trach Size: #6 cuffless Shiley with PMSV with SLP  Wound Vac (area): No       Skin: Moisture associated skin break down perineum, buttocks, Bruising bilateral arms and abdomen                             Bowel mgmt: Rectal tube as of 06/17/17, now removed  Bladder mgmt: Foley as of 06/18/17 due to acute urinary retention and malodorous output per chart review Diabetic mgmt: Yes that she managed with oral medication and insulin prior to admission.      Previous Home Environment Living Arrangements: Children, Spouse/significant other  Lives With: Spouse Available Help at Discharge: Family, Available 24 hours/day Type of Home: House Home Layout: One level Home Access: Stairs  to enter Entrance Stairs-Rails: Right, Left, Can reach both Entrance Stairs-Number of Steps: 4 Bathroom Shower/Tub: Multimedia programmer: Handicapped height Home Care Services: No  Discharge Living Setting Plans for Discharge Living Setting: Patient's home, Lives with (comment) (Spouse and daughter ) Type of Home at Discharge: House Discharge Home Layout: One level Discharge Home Access: Stairs to enter Entrance Stairs-Rails: Right, Left, Can reach both Entrance Stairs-Number of Steps: 4-5 Discharge Bathroom Shower/Tub: Tub/shower unit, Walk-in shower (they have both ) Discharge Bathroom Toilet: Handicapped height Discharge Bathroom Accessibility:  Yes How Accessible: Accessible via wheelchair, Accessible via walker Does the patient have any problems obtaining your medications?: No  Social/Family/Support Systems Patient Roles: Spouse, Parent Contact Information: Spouse: Blake Goya Cell: [phone]; Daughter: Briahnna Harries Cell: [phone] Anticipated Caregiver: Spouse, daughter, and son Anticipated Caregiver's Contact Information: see above  Ability/Limitations of Caregiver: They work but are aware of anticaipted need for 24/7  Caregiver Availability: Intermittent Discharge Plan Discussed with Primary Caregiver: Yes Is Caregiver In Agreement with Plan?: Yes Does Caregiver/Family have Issues with Lodging/Transportation while Pt is in Rehab?: No  Goals/Additional Needs Patient/Family Goal for Rehab: PT/OT/SLP Mod A Expected length of stay: 22-27 days  Cultural Considerations: non-practicing Catholic  Dietary Needs: NPO Equipment Needs: TBD Special Service Needs: None Additional Information: N/A Pt/Family Agrees to Admission and willing to participate: Yes Program Orientation Provided & Reviewed with Pt/Caregiver Including Roles  & Responsibilities: Yes Additional Information Needs: N/A  Barriers to Discharge: Nutrition means, Incontinence, Trach, Decreased caregiver support  Decrease burden of Care through IP rehab admission: No  Possible need for SNF placement upon discharge: Not anticipated   Patient Condition: This patient's medical and functional status has changed since the consult dated: 06/14/17 in which the Rehabilitation Physician determined and documented that the patient's condition is appropriate for intensive rehabilitative care in an inpatient rehabilitation facility. See "History of Present Illness" (above) for medical update. Functional changes are: Max-Total A +2 transfers. Patient's medical and functional status update has been discussed with the Rehabilitation physician and patient remains  appropriate for inpatient rehabilitation. Will admit to inpatient rehab today.  Preadmission Screen Completed By:  Gunnar Fusi, 06/18/2017 12:45 PM ______________________________________________________________________   Discussed status with Dr. Naaman Plummer on 06/18/17 at 1250 and received telephone approval for admission today.  Admission Coordinator:  Gunnar Fusi, time 1250/Date 06/18/17       Cosigned by: Meredith Staggers, MD at 06/18/2017 1:03 PM  Revision History

## 2017-06-18 NOTE — Progress Notes (Signed)
Bloody secretions coming from tracheostomy;  respiratory therapist assessed and suctioned site and recommended increase in PRN suctioning frequency and to report to attending MD in case any further testing is needed. Will continue to monitor  Annita Brod, RN

## 2017-06-19 ENCOUNTER — Inpatient Hospital Stay (HOSPITAL_COMMUNITY): Payer: Medicare Other | Admitting: Physical Therapy

## 2017-06-19 ENCOUNTER — Inpatient Hospital Stay (HOSPITAL_COMMUNITY): Payer: Medicare Other | Admitting: Occupational Therapy

## 2017-06-19 ENCOUNTER — Inpatient Hospital Stay (HOSPITAL_COMMUNITY): Payer: Medicare Other | Admitting: Speech Pathology

## 2017-06-19 DIAGNOSIS — G8191 Hemiplegia, unspecified affecting right dominant side: Secondary | ICD-10-CM

## 2017-06-19 DIAGNOSIS — D62 Acute posthemorrhagic anemia: Secondary | ICD-10-CM

## 2017-06-19 DIAGNOSIS — E1149 Type 2 diabetes mellitus with other diabetic neurological complication: Secondary | ICD-10-CM

## 2017-06-19 DIAGNOSIS — I10 Essential (primary) hypertension: Secondary | ICD-10-CM

## 2017-06-19 DIAGNOSIS — I63132 Cerebral infarction due to embolism of left carotid artery: Secondary | ICD-10-CM

## 2017-06-19 DIAGNOSIS — R1312 Dysphagia, oropharyngeal phase: Secondary | ICD-10-CM

## 2017-06-19 DIAGNOSIS — D72829 Elevated white blood cell count, unspecified: Secondary | ICD-10-CM

## 2017-06-19 DIAGNOSIS — E119 Type 2 diabetes mellitus without complications: Secondary | ICD-10-CM

## 2017-06-19 LAB — COMPREHENSIVE METABOLIC PANEL
ALBUMIN: 2.6 g/dL — AB (ref 3.5–5.0)
ALK PHOS: 104 U/L (ref 38–126)
ALT: 29 U/L (ref 14–54)
AST: 106 U/L — AB (ref 15–41)
Anion gap: 8 (ref 5–15)
BILIRUBIN TOTAL: 0.3 mg/dL (ref 0.3–1.2)
BUN: 30 mg/dL — AB (ref 6–20)
CALCIUM: 8.7 mg/dL — AB (ref 8.9–10.3)
CO2: 23 mmol/L (ref 22–32)
Chloride: 104 mmol/L (ref 101–111)
Creatinine, Ser: 0.87 mg/dL (ref 0.44–1.00)
GFR calc Af Amer: >60 mL/min (ref >60)
GFR calc non Af Amer: >60 mL/min (ref >60)
GLUCOSE: 146 mg/dL — AB (ref 65–99)
Potassium: 4.2 mmol/L (ref 3.5–5.1)
SODIUM: 135 mmol/L (ref 135–145)
TOTAL PROTEIN: 6 g/dL — AB (ref 6.5–8.1)

## 2017-06-19 LAB — GLUCOSE, CAPILLARY
GLUCOSE-CAPILLARY: 95 mg/dL (ref 65–99)
GLUCOSE-CAPILLARY: 148 mg/dL — AB (ref 65–99)
Glucose-Capillary: 118 mg/dL — ABNORMAL HIGH (ref 65–99)
Glucose-Capillary: 129 mg/dL — ABNORMAL HIGH (ref 65–99)
Glucose-Capillary: 157 mg/dL — ABNORMAL HIGH (ref 65–99)
Glucose-Capillary: 200 mg/dL — ABNORMAL HIGH (ref 65–99)
Glucose-Capillary: 201 mg/dL — ABNORMAL HIGH (ref 65–99)
Glucose-Capillary: 57 mg/dL — ABNORMAL LOW (ref 65–99)

## 2017-06-19 LAB — CBC WITH DIFFERENTIAL/PLATELET
BASOS ABS: 0 10*3/uL (ref 0.0–0.1)
BASOS PCT: 0 %
EOS ABS: 0.6 10*3/uL (ref 0.0–0.7)
Eosinophils Relative: 5 %
HEMATOCRIT: 30.7 % — AB (ref 36.0–46.0)
HEMOGLOBIN: 9.4 g/dL — AB (ref 12.0–15.0)
Lymphocytes Relative: 20 %
Lymphs Abs: 2.2 10*3/uL (ref 0.7–4.0)
MCH: 27.2 pg (ref 26.0–34.0)
MCHC: 30.6 g/dL (ref 30.0–36.0)
MCV: 89 fL (ref 78.0–100.0)
Monocytes Absolute: 0.7 10*3/uL (ref 0.1–1.0)
Monocytes Relative: 7 %
NEUTROS PCT: 68 %
Neutro Abs: 7.8 10*3/uL — ABNORMAL HIGH (ref 1.7–7.7)
Platelets: 396 10*3/uL (ref 150–400)
RBC: 3.45 MIL/uL — AB (ref 3.87–5.11)
RDW: 14.9 % (ref 11.5–15.5)
WBC: 11.4 10*3/uL — AB (ref 4.0–10.5)

## 2017-06-19 MED ORDER — TOPIRAMATE 25 MG PO TABS
25.0000 mg | ORAL_TABLET | Freq: Every day | ORAL | Status: DC
  Administered 2017-06-19 – 2017-06-21 (×3): 25 mg via ORAL
  Filled 2017-06-19 (×3): qty 1

## 2017-06-19 NOTE — Plan of Care (Signed)
Problem: RH PAIN MANAGEMENT Goal: RH STG PAIN MANAGED AT OR BELOW PT'S PAIN GOAL Managed at goall 2/10  Outcome: Not Progressing c/o HA

## 2017-06-19 NOTE — Progress Notes (Signed)
Patient sat in wheelchair for about 30 minutes. Passy-Muir valve was applied and patient states she cannot sit in the chair any longer. She was transferred to the bed with a +2 assist using a steady. Patient used 20-30% of strength.

## 2017-06-19 NOTE — Progress Notes (Signed)
Initial Nutrition Assessment  DOCUMENTATION CODES:   Not applicable  INTERVENTION:  1. Continue previous regimen of Vital 1.5 at 57mL/hr, Pro-Stat 75mL daily, provides 1900 calories, 96 grams of protein, 961mL free water  2. Recommend 150mL free water flush every 4 hours, total regimen provides 1813mL free water  3. Recommend draw Phos - was 2.3 on 8/26 - other electrolytes WNL   Monitor for diet advancement  NUTRITION DIAGNOSIS:   Inadequate oral intake related to inability to eat (Stroke) as evidenced by NPO status.  GOAL:   Patient will meet greater than or equal to 90% of their needs  MONITOR:   I & O's, Labs, Weight trends, TF tolerance, Diet advancement  REASON FOR ASSESSMENT:   Consult Enteral/tube feeding initiation and management  ASSESSMENT:   66yo female with history of DM, HTN, Peripheral neuropathy, HLD, pseudotumor cerebri, admitted on 8/13 with acute R hemispheric stroke s/p thrombectomy 8/16, trached 8/23 MBS done 8/30 showed dysphagia and silent aspiration, continues to be NPO. Presents to rehab with R hemiparesis, functional and cognitive deficits.   Undergoing Speech therapy during visit. Admitted to inpatient rehab yesterday. Denies any nausea/vomiting/diarrhea/constipation. Tolerating tube feeding. Can speak but is slow and delayed at this point. No apparent acute complaints.  Cortrak tube postpyloric  UOP last 12 hrs 1235mL  Medications reviewed and include:  Colace Novolog 0-15 Units every 4 hours, 4 Units every 4 hours, Lantus 17 units daily Labs reviewed:  CBG 201  Diet Order:  Diet NPO time specified  Skin:  Wound (see comment) (R Groin incision)  Last BM:  06/18/2017 (type 6)  Height:   Ht Readings from Last 1 Encounters:  06/18/17 5\' 4"  (1.626 m)    Weight:   Wt Readings from Last 1 Encounters:  06/18/17 170 lb 10.2 oz (77.4 kg)    Ideal Body Weight:  54.5 kg  BMI:  Body mass index is 29.29 kg/m.  Estimated Nutritional  Needs:   Kcal:  1600-1900 calories  Protein:  92-115 grams  Fluid:  1.6-1.9 L  EDUCATION NEEDS:   No education needs identified at this time  Satira Anis. Dionna Wiedemann, MS, RD LDN Inpatient Clinical Dietitian Pager [phone]

## 2017-06-19 NOTE — Progress Notes (Signed)
New found stage II on patients right buttocks. Although newly documented the pressure ulcer appears to have healing ridges. Pink foam placed around buttocks and patient turned to left side.

## 2017-06-19 NOTE — Progress Notes (Signed)
Patient's tube feed and tubing was changed, urinary catheter emptied, PIV removed and patient was turned to her left side.

## 2017-06-19 NOTE — Progress Notes (Signed)
[place] PHYSICAL MEDICINE & REHABILITATION     PROGRESS NOTE  Subjective/Complaints:  Patient seen lying in bed this morning. Family at bedside who notes patient slept fairly overnight. Family also notes a headache the last 2 days. Patient looks hesitant to begin therapies.  ROS: Denies CP, SOB, nausea, vomiting, diarrhea.  Objective: Vital Signs: Blood pressure 136/68, pulse 70, temperature 98.3 F (36.8 C), temperature source Oral, resp. rate 16, height 5\' 4"  (1.626 m), weight 77.4 kg (170 lb 10.2 oz), SpO2 98 %. Dg Swallowing Func-speech Pathology  Result Date: 06/17/2017 Objective Swallowing Evaluation: Type of Study: MBS-Modified Barium Swallow Study Patient Details Name: EBONEE STOBER MRN: [id number] Date of Birth: May 04, 1951 Today's Date: 06/17/2017 Time: SLP Start Time (ACUTE ONLY): 1426-SLP Stop Time (ACUTE ONLY): 1454 SLP Time Calculation (min) (ACUTE ONLY): 28 min Past Medical History: Past Medical History: Diagnosis Date . Anemia   takes iron supplement . Anesthesia complication   disorientation due to pseudotumor . Diabetes mellitus   IDDM . Family history of anesthesia complication 54 yrs ago  brother stopped breathing for a minute or two . GERD (gastroesophageal reflux disease)  . Headache(784.0)   due to pseudotumor; daily headache . Hypercholesteremia   unable to tolerate statins . Hypertension   under control with meds., has been on med. x 5 yr. . Peripheral neuropathy  . PONV (postoperative nausea and vomiting)  . 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 Synovitis of ankle 04/2013  left . Wears dentures   full Past Surgical History: Past Surgical History: Procedure Laterality Date . ANKLE ARTHROSCOPY Left 05/18/2013  Procedure: LEFT ANKLE ARTHROSCOPY WITH DEBRIDEMENT,  SUBTALAR OPEN DEBRIDEMENT ;  Surgeon: Wylene Simmer, MD;  Location: [place];  Service: Orthopedics;  Laterality: Left; .  APPENDECTOMY    as a child . BALLOON DILATION N/A 07/04/2013  Procedure: BALLOON DILATION;  Surgeon: Garlan Fair, MD;  Location: [facility];  Service: Endoscopy;  Laterality: N/A; . BLADDER SUSPENSION   . BREAST LUMPECTOMY W/ NEEDLE LOCALIZATION Left 05/22/2011 . CAROTID ANGIOGRAPHY Bilateral 06/03/2017  Procedure: Bilateral Carotid Angiography;  Surgeon: Conrad Sidell, MD;  Location: [facility];  Service: Cardiovascular;  Laterality: Bilateral; . COLONOSCOPY WITH PROPOFOL N/A 05/14/2014  Procedure: COLONOSCOPY WITH PROPOFOL;  Surgeon: Garlan Fair, MD;  Location: [facility];  Service: Endoscopy;  Laterality: N/A; . ESOPHAGEAL DILATION  06/23/2006; 08/05/2004 . ESOPHAGOGASTRODUODENOSCOPY N/A 07/04/2013  Procedure: ESOPHAGOGASTRODUODENOSCOPY (EGD);  Surgeon: Garlan Fair, MD;  Location: [facility];  Service: Endoscopy;  Laterality: N/A; . ESOPHAGOGASTRODUODENOSCOPY (EGD) WITH PROPOFOL N/A 05/14/2014  Procedure: ESOPHAGOGASTRODUODENOSCOPY (EGD) WITH PROPOFOL;  Surgeon: Garlan Fair, MD;  Location: [facility];  Service: Endoscopy;  Laterality: N/A; . IR ANGIO VERTEBRAL SEL SUBCLAVIAN INNOMINATE UNI R MOD SED  06/03/2017 . IR PERCUTANEOUS ART THROMBECTOMY/INFUSION INTRACRANIAL INC DIAG ANGIO  06/03/2017 . IR PERCUTANEOUS ART THROMBECTOMY/INFUSION INTRACRANIAL INC DIAG ANGIO  06/03/2017 . LUMBAR PERITONEAL SHUNT    x 2 . OVARIAN CYST REMOVAL    age 71 . RADIOLOGY WITH ANESTHESIA N/A 06/03/2017  Procedure: RADIOLOGY WITH ANESTHESIA;  Surgeon: Luanne Bras, MD;  Location: [place];  Service: Radiology;  Laterality: N/A; . SHUNT REVISION  2007 . TOTAL ABDOMINAL HYSTERECTOMY W/ BILATERAL SALPINGOOPHORECTOMY  1994  age 42s . WRIST SURGERY Left 2014   [name] HPI: 66yo female with hx HTN, DM who presented initially 8/13 to her PCP with headache, R sided  weakness and vision changes. She was sent for MRI which revealed multiple areas of acute L MCA infarct. She was sent to ER and admitted for CVA  workup. On 8/16 pt was in cath lab for diagnostic carotid and cerebral angiogram with vascular surgery when she became suddenly unresponsive with L sided weakness and possible L sided seizure-like activity. CTA revealed bilateral M1 occlusions. She was intubated and taken urgently to IR for revascularization and rapid resolution of embolism. ETT 8/16-8/20, 8/20-8/23, trach 8/23, pt tolerated trach collar overnight. Subjective: pt alert, worried about feeling nauseous from barium Assessment / Plan / Recommendation CHL IP CLINICAL IMPRESSIONS 06/17/2017 Clinical Impression Pt has a moderate pharyngeal dysphagia due to sensorimotor deficits, including reduced hyolaryngeal movement and airway closure. Epiglottic deflection also seems to be impeded by presence of NGT. She has silent aspiration during the swallow that is reduced but not eliminated by use of chin tuck, even with purees. She does a good, concsistent job of tucking her chin and taking small bites, but even so she will have periodic silent aspiration. Therefore, I do not think she is quite ready for a PO diet, but I do think she could start therapeutic snacks with SLP consisting of Dys 1 and honey thick textures by spoon. Pt would also be a good candidate for respiratory muscle training to increase strength of swallowing musculature and cough. Of note, pt did have several coughing episodes throughout study but they all seemed to be associated with her concern for regurgitation, NOT with airway compromise. SLP Visit Diagnosis Dysphagia, pharyngeal phase (R13.13) Attention and concentration deficit following -- Frontal lobe and executive function deficit following -- Impact on safety and function Moderate aspiration risk;Severe aspiration risk   CHL IP TREATMENT RECOMMENDATION 06/17/2017 Treatment Recommendations Therapy as outlined in treatment plan below   Prognosis 06/17/2017 Prognosis for Safe Diet Advancement Good Barriers to Reach Goals --  Barriers/Prognosis Comment -- CHL IP DIET RECOMMENDATION 06/17/2017 SLP Diet Recommendations NPO;Alternative means - temporary Liquid Administration via -- Medication Administration Via alternative means Compensations -- Postural Changes --   CHL IP OTHER RECOMMENDATIONS 06/17/2017 Recommended Consults -- Oral Care Recommendations Oral care QID Other Recommendations --   CHL IP FOLLOW UP RECOMMENDATIONS 06/17/2017 Follow up Recommendations Inpatient Rehab   CHL IP FREQUENCY AND DURATION 06/17/2017 Speech Therapy Frequency (ACUTE ONLY) min 3x week Treatment Duration 2 weeks      CHL IP ORAL PHASE 06/17/2017 Oral Phase WFL Oral - Pudding Teaspoon -- Oral - Pudding Cup -- Oral - Honey Teaspoon -- Oral - Honey Cup -- Oral - Nectar Teaspoon -- Oral - Nectar Cup -- Oral - Nectar Straw -- Oral - Thin Teaspoon -- Oral - Thin Cup -- Oral - Thin Straw -- Oral - Puree -- Oral - Mech Soft -- Oral - Regular -- Oral - Multi-Consistency -- Oral - Pill -- Oral Phase - Comment --  CHL IP PHARYNGEAL PHASE 06/17/2017 Pharyngeal Phase Impaired Pharyngeal- Pudding Teaspoon -- Pharyngeal -- Pharyngeal- Pudding Cup -- Pharyngeal -- Pharyngeal- Honey Teaspoon Delayed swallow initiation-vallecula;Reduced epiglottic inversion;Reduced anterior laryngeal mobility;Reduced laryngeal elevation;Reduced airway/laryngeal closure Pharyngeal -- Pharyngeal- Honey Cup -- Pharyngeal -- Pharyngeal- Nectar Teaspoon Delayed swallow initiation-vallecula;Reduced epiglottic inversion;Reduced anterior laryngeal mobility;Reduced laryngeal elevation;Reduced airway/laryngeal closure;Penetration/Aspiration during swallow Pharyngeal Material enters airway, passes BELOW cords without attempt by patient to eject out (silent aspiration) Pharyngeal- Nectar Cup -- Pharyngeal -- Pharyngeal- Nectar Straw -- Pharyngeal -- Pharyngeal- Thin Teaspoon -- Pharyngeal -- Pharyngeal- Thin Cup -- Pharyngeal -- Pharyngeal- Thin Straw --  Pharyngeal -- Pharyngeal- Puree Delayed swallow  initiation-vallecula;Reduced epiglottic inversion;Reduced anterior laryngeal mobility;Reduced laryngeal elevation;Reduced airway/laryngeal closure;Penetration/Aspiration during swallow Pharyngeal Material enters airway, passes BELOW cords without attempt by patient to eject out (silent aspiration) Pharyngeal- Mechanical Soft -- Pharyngeal -- Pharyngeal- Regular -- Pharyngeal -- Pharyngeal- Multi-consistency -- Pharyngeal -- Pharyngeal- Pill -- Pharyngeal -- Pharyngeal Comment --  CHL IP CERVICAL ESOPHAGEAL PHASE 06/17/2017 Cervical Esophageal Phase WFL Pudding Teaspoon -- Pudding Cup -- Honey Teaspoon -- Honey Cup -- Nectar Teaspoon -- Nectar Cup -- Nectar Straw -- Thin Teaspoon -- Thin Cup -- Thin Straw -- Puree -- Mechanical Soft -- Regular -- Multi-consistency -- Pill -- Cervical Esophageal Comment -- No flowsheet data found. [name] 06/17/2017, 4:05 PM  [name], M.A. CCC-SLP [phone]              Recent Labs  06/17/17 1221 06/18/17 0459  WBC 11.5* 11.3*  HGB 9.4* 9.4*  HCT 30.2* 30.3*  PLT 401* 390    Recent Labs  06/17/17 1221 06/18/17 0459  NA 141 140  K 4.0 4.0  CL 106 106  GLUCOSE 75 155*  BUN 18 17  CREATININE 0.65 0.77  CALCIUM 8.5* 8.7*   CBG (last 3)   Recent Labs  06/19/17 0004 06/19/17 0100 06/19/17 0401  GLUCAP 57* 95 201*    Wt Readings from Last 3 Encounters:  06/18/17 77.4 kg (170 lb 10.2 oz)  06/18/17 76.4 kg (168 lb 6.9 oz)  01/15/16 79.3 kg (174 lb 12.8 oz)    Physical Exam:  BP 136/68 (BP Location: Left Arm)   Pulse 70   Temp 98.3 F (36.8 C) (Oral) Comment: vitals on admission to rehab  Resp 16   Ht 5\' 4"  (1.626 m)   Wt 77.4 kg (170 lb 10.2 oz)   SpO2 98%   BMI 29.29 kg/m  Constitutional: She appears well-developed and well-nourished.  HENT: Normocephalic and atraumatic.  Eyes: EOM are normal. No discharge Neck: CFS #6 in place with brownish secretions in flange.  Cardiovascular: Normal rate and regular rhythm. No  JVD Respiratory: Effort normal. +rhonchi.  GI: Soft. Bowel sounds are normal.  Musculoskeletal: She exhibits no edema or tenderness.  Neurological: Alert  Left facial droop with dysarthric speech.  Right sided weakness with decrease in motor control.  RUE grossly 3/5.  RLE grossly 4-/5.  Skin: Skin is warm and dry. She is not diaphoretic.  Psychiatric: Her affect is blunt. Her speech is delayed. She is slowed. Cognition and memory are impaired.   Assessment/Plan: 1. 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 Physiatrist is providing close team supervision and 24 hour management of active medical problems listed below. Physiatrist and rehab team continue to assess barriers to discharge/monitor patient progress toward functional and medical goals.  Function:  Bathing Bathing position      Bathing parts      Bathing assist        Upper Body Dressing/Undressing Upper body dressing                    Upper body assist        Lower Body Dressing/Undressing Lower body dressing                                  Lower body assist  Toileting Toileting          Toileting assist     Transfers Chair/bed Clinical biochemist          Cognition Comprehension Comprehension assist level: Follows basic conversation/direction with extra time/assistive device  Expression Expression assist level: Expresses basic needs/ideas: With extra time/assistive device  Social Interaction Social Interaction assist level: Interacts appropriately 75 - 89% of the time - Needs redirection for appropriate language or to initiate interaction.  Problem Solving Problem solving assist level: Solves basic 75 - 89% of the time/requires cueing 10 - 24% of the time   Memory      Medical Problem List and Plan:  1. 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 DVT Prophylaxis/Anticoagulation: Pharmaceutical: Lovenox  3. Headaches/Pain Management:   Topamax started 9/1 4. Mood: LCSW to follow for evaluation and support.  5. Neuropsych: This patient is not capable of making decisions on her own behalf.  6. Skin/Wound Care: Routine pressure relief measures.  7. Fluids/Electrolytes/Nutrition: Monitor I/Os  BMP within acceptable range on 8/31 8. MSSA PNA: Completed treatment 7 day course of cefazolin on 8/30. Continue to monitor with strict aspiration precautions--HOB > 30 degrees at all times.  9. VDRF: Tolerating PMSV with supervision. Secretions felt to be in part due to GERD.  10 HTN: Monitor BP bid--on lopressor bid and catapres  Monitor with increased mobility  11. ABLA: Improved with transfusion.   Hemoglobin 9.4 on 8/31  Continue to monitor 12. Anxiety disorder:Team to provide ego support. Daughter staying to encourage patient. Palliative following for support and placed on IV ativan tid for symptoms management--decrease to prn due to sedation.  13. T2DM: Hgb A1C- Continue to titrated lantus insulin for tighter control. Monitor BS ac/hs and use SSI for elevations.   Monitor with increased mobility 14. GERD: on pepcid and domperitone  15. 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 Leucocytosis: persistent .Monitor for signs of infection.   WBCs 11.3 on 8/31  Afebrile  Continue to monitor 17. Pseudotumor Cerebri: Diamox resumed  18. B-MCA stenosis/L-ICA stenosis s/p revascularization: On ASA/Plavix.  47. Acute urinary retention: Continue foley for next few days to allow for acclimatization to unit. Then will DC  LOS (Days) 1 A FACE TO FACE EVALUATION WAS PERFORMED  Kaila Devries Lorie Phenix 06/19/2017 9:34 AM

## 2017-06-19 NOTE — Evaluation (Signed)
Speech Language Pathology Assessment and Plan  Patient Details  Name: [name] MRN: [id number] Date of Birth: August 14, 1951  SLP Diagnosis: Cognitive Impairments;Voice disorder;Dysphagia  Rehab Potential: Good ELOS: 3 weeks    Today's Date: 06/19/2017 SLP Individual Time: 1100-1200 SLP Individual Time Calculation (min): 60 min   Problem List:  Patient Active Problem List   Diagnosis Date Noted  . Diabetes mellitus ([place])   . CVA (cerebral vascular accident) ([place]) 06/18/2017  . Weakness   . 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 Right hemiparesis ([place])   . DNR (do not resuscitate) discussion 06/16/2017  . Palliative care by specialist 06/16/2017  . Anxiety state   . Acute embolic stroke ([place])   . Benign essential HTN   . Diabetic peripheral neuropathy ([place])   . Pseudotumor cerebri   . Hypokalemia   . Seizure ([place])   . Leukocytosis   . Acute blood loss anemia   . Post-operative pain   . Encounter for feeding tube placement   . Respiratory failure ([place])   . Status post tracheostomy ([place])   . Acute respiratory failure with hypoxemia ([place])   . Stenosis of left carotid artery   . Acute CVA (cerebrovascular accident) ([place]) 05/31/2017  . Type II diabetes mellitus with complication ([place]) 87/68/1157  . Hypertension 05/31/2017  . Hyponatremia 05/31/2017  . IIH (idiopathic intracranial hypertension) 01/18/2016  . Malfunction of ventriculo-peritoneal shunt ([place]) 01/18/2016  . 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 Worsening headaches 01/18/2016  . Increased abdominal girth 01/18/2016  . Abdominal fluid collection 01/18/2016  . Vision changes 01/18/2016  . Perceived hearing changes 01/18/2016  . Nausea without vomiting 01/18/2016  . Peripheral vision loss 01/18/2016  . Imbalance 01/18/2016  . Falls 01/18/2016  . Dysphagia, oropharyngeal 01/18/2016  . Subacute confusional state 01/18/2016  . Left breast mass 05/05/2011   Past Medical History:  Past  Medical History:  Diagnosis Date  . Anemia    takes iron supplement  . Anesthesia complication    disorientation due to pseudotumor  . Diabetes mellitus    IDDM  . Family history of anesthesia complication 30 yrs ago   brother stopped breathing for a minute or two  . GERD (gastroesophageal reflux disease)   . Headache(784.0)    due to pseudotumor; daily headache  . Hypercholesteremia    unable to tolerate statins  . Hypertension    under control with meds., has been on med. x 5 yr.  . Peripheral neuropathy   . PONV (postoperative nausea and vomiting)   . 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 Seizures ([place])    due to shunt failure; last seizure 2007  . Synovitis of ankle 04/2013   left  . Wears dentures    full   Past Surgical History:  Past Surgical History:  Procedure Laterality Date  . ANKLE ARTHROSCOPY Left 05/18/2013   Procedure: LEFT ANKLE ARTHROSCOPY WITH DEBRIDEMENT,  SUBTALAR OPEN DEBRIDEMENT ;  Surgeon: Wylene Simmer, MD;  Location: [facility];  Service: Orthopedics;  Laterality: Left;  . APPENDECTOMY     as a child  . BALLOON DILATION N/A 07/04/2013   Procedure: BALLOON DILATION;  Surgeon: Garlan Fair, MD;  Location: [facility];  Service: Endoscopy;  Laterality: N/A;  . BLADDER SUSPENSION    . BREAST LUMPECTOMY W/ NEEDLE LOCALIZATION Left 05/22/2011  . CAROTID ANGIOGRAPHY Bilateral 06/03/2017   Procedure: Bilateral Carotid Angiography;  Surgeon: Conrad Warsaw, MD;  Location: [facility];  Service: Cardiovascular;  Laterality: Bilateral;  . COLONOSCOPY WITH PROPOFOL N/A 05/14/2014   Procedure: COLONOSCOPY WITH PROPOFOL;  Surgeon: Garlan Fair, MD;  Location: [facility];  Service: Endoscopy;  Laterality: N/A;  . ESOPHAGEAL DILATION  06/23/2006; 08/05/2004  . ESOPHAGOGASTRODUODENOSCOPY N/A 07/04/2013   Procedure: ESOPHAGOGASTRODUODENOSCOPY (EGD);  Surgeon: Garlan Fair, MD;  Location: [facility];  Service: Endoscopy;   Laterality: N/A;  . ESOPHAGOGASTRODUODENOSCOPY (EGD) WITH PROPOFOL N/A 05/14/2014   Procedure: ESOPHAGOGASTRODUODENOSCOPY (EGD) WITH PROPOFOL;  Surgeon: Garlan Fair, MD;  Location: [facility];  Service: Endoscopy;  Laterality: N/A;  . IR ANGIO VERTEBRAL SEL SUBCLAVIAN INNOMINATE UNI R MOD SED  06/03/2017  . IR PERCUTANEOUS ART THROMBECTOMY/INFUSION INTRACRANIAL INC DIAG ANGIO  06/03/2017  . IR PERCUTANEOUS ART THROMBECTOMY/INFUSION INTRACRANIAL INC DIAG ANGIO  06/03/2017  . LUMBAR PERITONEAL SHUNT     x 2  . OVARIAN CYST REMOVAL     age 28  . RADIOLOGY WITH ANESTHESIA N/A 06/03/2017   Procedure: RADIOLOGY WITH ANESTHESIA;  Surgeon: Luanne Bras, MD;  Location: [facility];  Service: Radiology;  Laterality: N/A;  . SHUNT REVISION  2007  . TOTAL ABDOMINAL HYSTERECTOMY W/ BILATERAL SALPINGOOPHORECTOMY  1994   age 4s  . WRIST SURGERY Left 2014    [name]    Assessment / Plan / Recommendation Clinical Impression [name] is a 66 y.o. 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 She was sent to the ED for full work up. CTA head/neck done revealing large non-calcified plaque in proximal L-ICA causing 60-65% stenosis and likely source of emboli. She underwent cerebral angiogram for work up on 8/16 revealing <50% L-ICA stenosis and during the procedure became unresponsive with bilateral UE tonic posturing and left sided weakness. She was taken to IR for endovascualr revascularization of B-MCA occlusions with TICI reperfusion bilaterally. EEG revealed mild generalized dysfunction and intermittent BUE twitching not associated with epileptiform activity. Follow up MRI brain reviewed, showing multiple infarcts. Per report, numerous acute new supra and infratentorial infarcts involving B-MCA,  and bilateral PCA- no hemorrhage. She was intubated from 8/16 to 8/20, was briefly extubated on 8/20 but developed stridor with increase WOB requiring reintubation with tracheostomy placed on 8/23. She was downsized to CFS #6 on 8/29, is tolerating PMSV for 20 minutes with imporved phonation and breath support. She has had issues with copious secretions and mucomyst added. Tracheal secretion positive for MSSA and she was started on cefazolin 8/22 for treatment of MSSA bronchitis v/s PNA.  MBS done 8/30 showing dysphagia due to sensorimotor deficits and silent aspiration during swallow, remains NPO but to start therapeutic trials of puree and honey thick with ST. She is showing improvement in participation with encouragement and is tolerating activity at EOB. CIR recommended due to decline in mobility and ability to carry out ADL tasks. Pt admited to CIR on 06/18/17.    Passy muir speech valve, bedside swallow and cognitive-linguistic evaluations completed on 06/19/17. Pt tolerate placement of PMSV for ~ 60 minutes without any decline in vitals. Pt able to produce phonation that was hoarse and characteristic of decreased vocal intensity. Pt was ~ 75% intelligibility at the simple phrase level. Daughter is trainer to [employer] and duff PMSV. Recommend pt wear PMSV during all therapies, with daughter and nursing, full  supervision.   Pt continues to demonstrate moderate pharyngeal dysphagia c/b multiple swallows with trials of honey thick by spoon. Pt with delayed swallow initiation with trials of puree. This could be related to cognitive impairments (decreased attention, decreased awareness of need for PO trials). Continue to recommend NPO with trials by SLP only.  In addition to above deficits, pt presents with moderate cognitive impairments impacting recall of new information, decreased orientation, basic problem solving, sustained attention, initiation and awareness. Skilled ST is needed to address the above mentioned  deficits, increase functional independence and reduce caregiver burden. Aniticipate that pt will be able to achieve Min A level of assistance with HHST. However, will continue to provide education to pt's family on pt's progress and level of care required.    Skilled Therapeutic Interventions          Skilled treatment session focused on completion of the above mentioned evaluations, see above. Pt's daughter present and extensive time spent providing education on trach size, PMSV, swallow function, communication goals and cognitive impairments. Pt required Max A to Mod A to consume trials of PO intake possibly d/t decreased cognitive function.    SLP Assessment  Patient will need skilled Speech Lanaguage Pathology Services during CIR admission    Recommendations  Patient may use Passy-Muir Speech Valve: During all therapies with supervision;Caregiver trained to provide supervision;Intermittently with supervision PMSV Supervision: Full MD: Please consider changing trach tube to : Smaller size SLP Diet Recommendations: NPO Medication Administration: Via alternative means Supervision: Full supervision/cueing for compensatory strategies Postural Changes and/or Swallow Maneuvers: Seated upright 90 degrees Oral Care Recommendations: Oral care QID Recommendations for Other Services: Neuropsych consult Patient destination: Home Follow up Recommendations: 24 hour supervision/assistance;Home Health SLP Equipment Recommended: To be determined    SLP Frequency 3 to 5 out of 7 days   SLP Duration  SLP Intensity  SLP Treatment/Interventions 3 weeks  Minumum of 1-2 x/day, 30 to 90 minutes  Cognitive remediation/compensation;Cueing hierarchy;Dysphagia/aspiration precaution training;Functional tasks;Patient/family education;Therapeutic Activities;Internal/external aids    Pain    Prior Functioning Cognitive/Linguistic Baseline: Within functional limits Type of Home: House  Lives With:  Spouse Available Help at Discharge: Family;Available 24 hours/day Vocation: On disability  Function:  Eating Eating   Modified Consistency Diet: Yes (With SLP only) Eating Assist Level: More than reasonable amount of time;Supervision or verbal cues;Helper feeds patient           Cognition Comprehension Comprehension assist level: Understands basic 50 - 74% of the time/ requires cueing 25 - 49% of the time  Expression Expression assistive device: Talk trach valve Expression assist level: Expresses basic 50 - 74% of the time/requires cueing 25 - 49% of the time. Needs to repeat parts of sentences.;Expresses basic 25 - 49% of the time/requires cueing 50 - 75% of the time. Uses single words/gestures.  Social Interaction Social Interaction assist level: Interacts appropriately 25 - 49% of time - Needs frequent redirection.;Interacts appropriately less than 25% of the time. May be withdrawn or combative.  Problem Solving Problem solving assist level: Solves basic 50 - 74% of the time/requires cueing 25 - 49% of the time;Solves basic 25 - 49% of the time - needs direction more than half the time to initiate, plan or complete simple activities  Memory Memory assist level: Recognizes or recalls less than 25% of the time/requires cueing greater than 75% of the time   Short Term Goals: Week 1: SLP Short Term Goal 1 (Week 1): Pt will tolerate PMSV without decline  in vitals during all waking hours.  SLP Short Term Goal 2 (Week 1): Pt will consume trials of honey thick via spoon and puree with minimal overt s/s of aspiration to demonstrate readiness for repeat instrumental swallow study. SLP Short Term Goal 3 (Week 1): Pt will increase vocal intensity with Mod A cues to achieve ~ 90% intelligiblity at the phrase level.  SLP Short Term Goal 4 (Week 1): Pt will increase sustained attention to task for ~ 20 minutes with Mod A cues.  SLP Short Term Goal 5 (Week 1): Pt will demonstrate basic problem solving  for familiar tasks with Mod A cues.   Refer to Care Plan for Long Term Goals  Recommendations for other services: Neuropsych  Discharge Criteria: Patient will be discharged from SLP if patient refuses treatment 3 consecutive times without medical reason, if treatment goals not met, if there is a change in medical status, if patient makes no progress towards goals or if patient is discharged from hospital.  The above assessment, treatment plan, treatment alternatives and goals were discussed and mutually agreed upon: by patient and by family  Raidyn Wassink 06/19/2017, 12:47 PM

## 2017-06-19 NOTE — Evaluation (Signed)
Physical Therapy Assessment and Plan  Patient Details  Name: [name] MRN: [id number] Date of Birth: 07-07-1951  PT Diagnosis: Abnormality of gait, Coordination disorder, Hemiparesis dominant, Impaired cognition and Muscle weakness Rehab Potential: Fair ELOS: 21-24 days   Today's Date: 06/19/2017 PT Individual Time: 1300-1410 PT Individual Time Calculation (min): 70 min    Problem List:  Patient Active Problem List   Diagnosis Date Noted  . Diabetes mellitus ([place])   . CVA (cerebral vascular accident) ([place]) 06/18/2017  . Weakness   . 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 Right hemiparesis ([place])   . DNR (do not resuscitate) discussion 06/16/2017  . Palliative care by specialist 06/16/2017  . Anxiety state   . Acute embolic stroke ([place])   . Benign essential HTN   . Diabetic peripheral neuropathy ([place])   . Pseudotumor cerebri   . Hypokalemia   . Seizure ([place])   . Leukocytosis   . Acute blood loss anemia   . Post-operative pain   . Encounter for feeding tube placement   . Respiratory failure ([place])   . Status post tracheostomy ([place])   . Acute respiratory failure with hypoxemia ([place])   . Stenosis of left carotid artery   . Acute CVA (cerebrovascular accident) ([place]) 05/31/2017  . Type II diabetes mellitus with complication ([place]) 89/38/1017  . Hypertension 05/31/2017  . Hyponatremia 05/31/2017  . IIH (idiopathic intracranial hypertension) 01/18/2016  . Malfunction of ventriculo-peritoneal shunt ([place]) 01/18/2016  . 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 Worsening headaches 01/18/2016  . Increased abdominal girth 01/18/2016  . Abdominal fluid collection 01/18/2016  . Vision changes 01/18/2016  . Perceived hearing changes 01/18/2016  . Nausea without vomiting 01/18/2016  . Peripheral vision loss 01/18/2016  . Imbalance 01/18/2016  . Falls 01/18/2016  . Dysphagia, oropharyngeal 01/18/2016  . Subacute confusional state 01/18/2016  . Left breast  mass 05/05/2011    Past Medical History:  Past Medical History:  Diagnosis Date  . Anemia    takes iron supplement  . Anesthesia complication    disorientation due to pseudotumor  . Diabetes mellitus    IDDM  . Family history of anesthesia complication 52 yrs ago   brother stopped breathing for a minute or two  . GERD (gastroesophageal reflux disease)   . Headache(784.0)    due to pseudotumor; daily headache  . Hypercholesteremia    unable to tolerate statins  . Hypertension    under control with meds., has been on med. x 5 yr.  . Peripheral neuropathy   . PONV (postoperative nausea and vomiting)   . 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 Seizures ([place])    due to shunt failure; last seizure 2007  . Synovitis of ankle 04/2013   left  . Wears dentures    full   Past Surgical History:  Past Surgical History:  Procedure Laterality Date  . ANKLE ARTHROSCOPY Left 05/18/2013   Procedure: LEFT ANKLE ARTHROSCOPY WITH DEBRIDEMENT,  SUBTALAR OPEN DEBRIDEMENT ;  Surgeon: Wylene Simmer, MD;  Location: [facility];  Service: Orthopedics;  Laterality: Left;  . APPENDECTOMY     as a child  . BALLOON DILATION N/A 07/04/2013   Procedure: BALLOON DILATION;  Surgeon: Garlan Fair, MD;  Location: [facility];  Service: Endoscopy;  Laterality: N/A;  . BLADDER SUSPENSION    . BREAST LUMPECTOMY W/ NEEDLE LOCALIZATION Left 05/22/2011  . CAROTID ANGIOGRAPHY Bilateral 06/03/2017   Procedure: Bilateral  Carotid Angiography;  Surgeon: Conrad [place], MD;  Location: [facility];  Service: Cardiovascular;  Laterality: Bilateral;  . COLONOSCOPY WITH PROPOFOL N/A 05/14/2014   Procedure: COLONOSCOPY WITH PROPOFOL;  Surgeon: Garlan Fair, MD;  Location: [facility];  Service: Endoscopy;  Laterality: N/A;  . ESOPHAGEAL DILATION  06/23/2006; 08/05/2004  . ESOPHAGOGASTRODUODENOSCOPY N/A 07/04/2013   Procedure: ESOPHAGOGASTRODUODENOSCOPY (EGD);  Surgeon: Garlan Fair,  MD;  Location: [facility];  Service: Endoscopy;  Laterality: N/A;  . ESOPHAGOGASTRODUODENOSCOPY (EGD) WITH PROPOFOL N/A 05/14/2014   Procedure: ESOPHAGOGASTRODUODENOSCOPY (EGD) WITH PROPOFOL;  Surgeon: Garlan Fair, MD;  Location: [facility];  Service: Endoscopy;  Laterality: N/A;  . IR ANGIO VERTEBRAL SEL SUBCLAVIAN INNOMINATE UNI R MOD SED  06/03/2017  . IR PERCUTANEOUS ART THROMBECTOMY/INFUSION INTRACRANIAL INC DIAG ANGIO  06/03/2017  . IR PERCUTANEOUS ART THROMBECTOMY/INFUSION INTRACRANIAL INC DIAG ANGIO  06/03/2017  . LUMBAR PERITONEAL SHUNT     x 2  . OVARIAN CYST REMOVAL     age 66  . RADIOLOGY WITH ANESTHESIA N/A 06/03/2017   Procedure: RADIOLOGY WITH ANESTHESIA;  Surgeon: Luanne Bras, MD;  Location: [facility];  Service: Radiology;  Laterality: N/A;  . SHUNT REVISION  2007  . TOTAL ABDOMINAL HYSTERECTOMY W/ BILATERAL SALPINGOOPHORECTOMY  1994   age 47s  . WRIST SURGERY Left 2014    [name]    Assessment & Plan Clinical Impression:  is a 66 y.o. female with history of DM, HTN, peripheral neuropathy, pseudotumor cerebri, seizure disorder, one week history of right hand weakness, and HA who had MRI brain done 05/30/17 revealing acute infarcts in left parietal cortex, white matter and occipital lobe. She was sent to the ED for full work up. History taken from chart review. Carotid dopplers done revealing L-40-59% ICA stenosis. Cardiac echo revealed  EF 60-65% with no wall abnormality and grade 1 diastolic dysfunction. CTA head/neck done revealing large non-calcified plaque in proximal L-ICA causing 60-65% stenosis and likely source of emboli.    She underwent cerebral angiogram for work up on 8/16 revealing < 50% L-ICA stenosis and during the procedure became unresponsive with bilateral UE tonic posturing and left sided weakness. She was taken to IR for endovascualr revascularization of B-MCA occlusions with TICI reperfusion bilaterally. Dr. Erlinda Hong recommended DAPT x 3 months followed by  Plavix alone. EEG revealed mild generalized dysfunction and intermittent BUE twitching not associated with epileptiform activity. Follow up MRI brain reviewed, showing multiple infarcts.  Per report, numerous acute new supra and infratentorial infarcts involving B-MCA, and bilateral PCA- no hemorrhage. She was briefly extubated on 06/07/17 but developed stridor with increase WOB requiring reintubation and beside tracheostomy on 06/10/17 by Dr. Nelda Marseille.      She was extubated to ATC and downsized to CFS #6 on 06/16/17. She is tolerating PMSV for 20 minutes with imporved phonation and breath support. She has had issues with copious secretions and mucomyst added. Tracheal secretion positive for MSSA and she was started on cefazolin 06/09/17 for treatment of MSSA bronchitis v/s PNA.  She did have drop in H/H to 7.6 and transfused with one unit PRBC on 06/16/17. MBS done 06/17/17 showing dysphagia due to sensorimotor deficits and silent aspiration during swallow and remains NPO but to start therapeutic trials with SLP. She is showing improvement in participation with encouragement and is tolerating activity at EOB as well as to chair transfers. CIR recommended due to functional decline in mobility and ability to carry out ADL tasks. Patient admitted to [facility] 06/18/17.   Patient currently requires total to +2 assist with mobility secondary to muscle weakness, decreased cardiorespiratoy endurance, impaired timing and sequencing, abnormal tone, unbalanced muscle activation and decreased coordination, decreased visual perceptual skills, decreased midline orientation, decreased attention, decreased awareness, decreased problem solving, decreased safety awareness, decreased memory and delayed processing and decreased sitting balance, decreased standing balance, decreased postural control, hemiplegia and decreased balance strategies.  Prior to hospitalization, patient was modified independent  with  mobility and lived with Spouse, Family in a House home.  Home access is 4Stairs to enter, Ramped entrance.  Patient will benefit from skilled PT intervention to maximize safe functional mobility, minimize fall risk and decrease caregiver burden for planned discharge home with 24 hour assist.  Anticipate patient will benefit from follow up [facility] at discharge.  PT - End of Session Activity Tolerance: Tolerates < 10 min activity with changes in vital signs Endurance Deficit: Yes PT Assessment Rehab Potential (ACUTE/IP ONLY): Fair PT Barriers to Discharge: Trach PT Patient demonstrates impairments in the following area(s): Balance;Endurance;Motor;Perception;Safety PT Transfers Functional Problem(s): Bed Mobility;Bed to Chair;Car;Furniture PT Locomotion Functional Problem(s): Stairs;Wheelchair Mobility;Ambulation PT Plan PT Intensity: Minimum of 1-2 x/day ,45 to 90 minutes PT Frequency: 5 out of 7 days PT Duration Estimated Length of Stay: 21-24 days PT Treatment/Interventions: Ambulation/gait training;Balance/vestibular training;Cognitive remediation/compensation;Discharge planning;Community reintegration;DME/adaptive equipment instruction;Functional mobility training;Patient/family education;Neuromuscular re-education;Psychosocial support;Splinting/orthotics;Skin care/wound management;UE/LE Strength taining/ROM;Stair training;Wheelchair propulsion/positioning;UE/LE Coordination activities;Therapeutic Activities;Therapeutic Exercise;Visual/perceptual remediation/compensation PT Transfers Anticipated Outcome(s): supervision PT Locomotion Anticipated Outcome(s): min assist ambulatory in controlled environment, supervision w/c level for home/community PT Recommendation Follow Up Recommendations: Home health PT;24 hour supervision/assistance Patient destination: Home Equipment Recommended: To be determined  Skilled Therapeutic Intervention No c/o pain but pt flat and lethargic throughout session.  PT  provided education to pt and her daughter regarding rehab process, PT goals, plan of care, and estimated length of stay.  Pt participated in session focus on initial PT assessment followed by instruction in bed mobility, weight shifts, transfers, and positioning.  Pt able to tolerate session with PMSV and O2 saturations maintained at 99-100% on trach collar O2.  Pt agreeable to stay seated in w/c at end of session, positioned with half lap tray for RUE comfort, call bell in reach and needs met.   PT Evaluation Precautions/Restrictions Precautions Precautions: Fall Precaution Comments: cortrak, trach Restrictions Weight Bearing Restrictions: No Therapy Vitals O2 maintained on trach collar >99% throughout session with PMSV applied Pain Pain Assessment Pain Assessment: No/denies pain Pain Score: 4  Home Living/Prior Functioning Home Living Available Help at Discharge: Family;Available 24 hours/day Type of Home: House Home Access: Stairs to enter;Ramped entrance Entrance Stairs-Number of Steps: 4 Entrance Stairs-Rails: Right;Left;Can reach both Home Layout: One level  Lives With: Spouse;Family Prior Function Level of Independence: Requires assistive device for independence  Able to Take Stairs?: Yes Driving: No Vocation: On disability Vision/Perception  Perception Perception: Impaired Comments: to be further tested throughout LOS Praxis Praxis: Impaired Praxis-Other Comments: to be further tested throughout LOS  Cognition Overall Cognitive Status: Impaired/Different from baseline Arousal/Alertness: Awake/alert Orientation Level: Disoriented X4 Attention: Sustained Sustained Attention: Impaired Sustained Attention Impairment: Verbal basic;Functional basic Memory: Impaired Memory Impairment: Decreased recall of new information Awareness: Impaired Awareness Impairment: Intellectual impairment Problem Solving: Impaired Problem Solving Impairment: Verbal basic;Functional  basic Executive Function:  (All areas impacted by lower level deficits) Safety/Judgment: Impaired Comments: flat affect Sensation Sensation Light Touch: Not tested Coordination Gross Motor Movements are Fluid and Coordinated: No Fine Motor Movements are Fluid and  Coordinated: No Coordination and Movement Description: RUE flexor synergy Motor  Motor Motor: Abnormal tone;Hemiplegia;Abnormal postural alignment and control  Mobility Bed Mobility Bed Mobility: Rolling Right;Supine to Sit Rolling Right: 4: Min assist Supine to Sit: 3: Mod assist;With rails;HOB elevated Supine to Sit Details: Verbal cues for technique;Verbal cues for precautions/safety;Verbal cues for safe use of DME/AE;Manual facilitation for weight shifting Transfers Transfers: Yes Sit to Stand: From bed;1: +1 Total assist;From elevated surface (unable to come to standing with +1 assist) Sit to Stand Details: Manual facilitation for weight shifting;Manual facilitation for placement;Verbal cues for technique;Verbal cues for precautions/safety;Verbal cues for safe use of DME/AE Squat Pivot Transfers: 1: +1 Total assist;With armrests;Without upper extremity assistance Squat Pivot Transfer Details (indicate cue type and reason): pt reporting pain in RUE so positioned on therapist's shoulders; pt demos good forward weight shift and head/hips relationship but poor activation (motor planning v weakness) in BLEs for power up Locomotion  Ambulation Ambulation: No Gait Gait: No Stairs / Additional Locomotion Stairs: No Wheelchair Mobility Wheelchair Mobility: No  Trunk/Postural Assessment  Postural Control Postural Control: Deficits on evaluation Righting Reactions: absent Protective Responses: delayed and insufficient  Balance Balance Balance Assessed: Yes Static Sitting Balance Static Sitting - Balance Support: Bilateral upper extremity supported;Feet supported Static Sitting - Level of Assistance: 5: Stand by  assistance Dynamic Sitting Balance Dynamic Sitting - Balance Support: Feet supported;During functional activity Dynamic Sitting - Level of Assistance: 4: Min assist Extremity Assessment      RLE Assessment RLE Assessment: Exceptions to [facility] RLE Strength RLE Overall Strength Comments: grossly 3/5 at hip/knee LLE Assessment LLE Assessment: Exceptions to [facility] LLE Strength LLE Overall Strength Comments: at least 3+/5 at hip and knee   See Function Navigator for Current Functional Status.   Refer to Care Plan for Long Term Goals  Recommendations for other services: None   Discharge Criteria: Patient will be discharged from PT if patient refuses treatment 3 consecutive times without medical reason, if treatment goals not met, if there is a change in medical status, if patient makes no progress towards goals or if patient is discharged from hospital.  The above assessment, treatment plan, treatment alternatives and goals were discussed and mutually agreed upon: by patient  Michel Santee 06/19/2017, 2:23 PM

## 2017-06-19 NOTE — Evaluation (Signed)
Occupational Therapy Assessment and Plan  Patient Details  Name: [name] MRN: [id number] Date of Birth: October 02, 1951  OT Diagnosis: abnormal posture, cognitive deficits, hemiplegia affecting dominant side and coordination disorder Rehab Potential: Rehab Potential (ACUTE ONLY): Good ELOS: 21-24 days   Today's Date: 06/19/2017 OT Individual Time: 0700-0800 OT Individual Time Calculation (min): 60 min     Problem List:  Patient Active Problem List   Diagnosis Date Noted  . Diabetes mellitus ([place])   . CVA (cerebral vascular accident) ([place]) 06/18/2017  . Weakness   . 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 Right hemiparesis ([place])   . DNR (do not resuscitate) discussion 06/16/2017  . Palliative care by specialist 06/16/2017  . Anxiety state   . Acute embolic stroke ([place])   . Benign essential HTN   . Diabetic peripheral neuropathy ([place])   . Pseudotumor cerebri   . Hypokalemia   . Seizure ([place])   . Leukocytosis   . Acute blood loss anemia   . Post-operative pain   . Encounter for feeding tube placement   . Respiratory failure ([place])   . Status post tracheostomy ([place])   . Acute respiratory failure with hypoxemia ([place])   . Stenosis of left carotid artery   . Acute CVA (cerebrovascular accident) ([place]) 05/31/2017  . Type II diabetes mellitus with complication ([place]) 82/99/3716  . Hypertension 05/31/2017  . Hyponatremia 05/31/2017  . IIH (idiopathic intracranial hypertension) 01/18/2016  . Malfunction of ventriculo-peritoneal shunt ([place]) 01/18/2016  . 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 Worsening headaches 01/18/2016  . Increased abdominal girth 01/18/2016  . Abdominal fluid collection 01/18/2016  . Vision changes 01/18/2016  . Perceived hearing changes 01/18/2016  . Nausea without vomiting 01/18/2016  . Peripheral vision loss 01/18/2016  . Imbalance 01/18/2016  . Falls 01/18/2016  . Dysphagia, oropharyngeal 01/18/2016  . Subacute confusional state  01/18/2016  . Left breast mass 05/05/2011    Past Medical History:  Past Medical History:  Diagnosis Date  . Anemia    takes iron supplement  . Anesthesia complication    disorientation due to pseudotumor  . Diabetes mellitus    IDDM  . Family history of anesthesia complication 60 yrs ago   brother stopped breathing for a minute or two  . GERD (gastroesophageal reflux disease)   . Headache(784.0)    due to pseudotumor; daily headache  . Hypercholesteremia    unable to tolerate statins  . Hypertension    under control with meds., has been on med. x 5 yr.  . Peripheral neuropathy   . PONV (postoperative nausea and vomiting)   . 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 Seizures ([place])    due to shunt failure; last seizure 2007  . Synovitis of ankle 04/2013   left  . Wears dentures    full   Past Surgical History:  Past Surgical History:  Procedure Laterality Date  . ANKLE ARTHROSCOPY Left 05/18/2013   Procedure: LEFT ANKLE ARTHROSCOPY WITH DEBRIDEMENT,  SUBTALAR OPEN DEBRIDEMENT ;  Surgeon: Wylene Simmer, MD;  Location: [facility];  Service: Orthopedics;  Laterality: Left;  . APPENDECTOMY     as a child  . BALLOON DILATION N/A 07/04/2013   Procedure: BALLOON DILATION;  Surgeon: Garlan Fair, MD;  Location: [facility];  Service: Endoscopy;  Laterality: N/A;  . BLADDER SUSPENSION    . BREAST LUMPECTOMY W/ NEEDLE LOCALIZATION Left 05/22/2011  . CAROTID ANGIOGRAPHY Bilateral 06/03/2017  Procedure: Bilateral Carotid Angiography;  Surgeon: Conrad Cow Creek, MD;  Location: [facility];  Service: Cardiovascular;  Laterality: Bilateral;  . COLONOSCOPY WITH PROPOFOL N/A 05/14/2014   Procedure: COLONOSCOPY WITH PROPOFOL;  Surgeon: Garlan Fair, MD;  Location: [facility];  Service: Endoscopy;  Laterality: N/A;  . ESOPHAGEAL DILATION  06/23/2006; 08/05/2004  . ESOPHAGOGASTRODUODENOSCOPY N/A 07/04/2013   Procedure: ESOPHAGOGASTRODUODENOSCOPY (EGD);   Surgeon: Garlan Fair, MD;  Location: [facility];  Service: Endoscopy;  Laterality: N/A;  . ESOPHAGOGASTRODUODENOSCOPY (EGD) WITH PROPOFOL N/A 05/14/2014   Procedure: ESOPHAGOGASTRODUODENOSCOPY (EGD) WITH PROPOFOL;  Surgeon: Garlan Fair, MD;  Location: [facility];  Service: Endoscopy;  Laterality: N/A;  . IR ANGIO VERTEBRAL SEL SUBCLAVIAN INNOMINATE UNI R MOD SED  06/03/2017  . IR PERCUTANEOUS ART THROMBECTOMY/INFUSION INTRACRANIAL INC DIAG ANGIO  06/03/2017  . IR PERCUTANEOUS ART THROMBECTOMY/INFUSION INTRACRANIAL INC DIAG ANGIO  06/03/2017  . LUMBAR PERITONEAL SHUNT     x 2  . OVARIAN CYST REMOVAL     age 39  . RADIOLOGY WITH ANESTHESIA N/A 06/03/2017   Procedure: RADIOLOGY WITH ANESTHESIA;  Surgeon: Luanne Bras, MD;  Location: [facility];  Service: Radiology;  Laterality: N/A;  . SHUNT REVISION  2007  . TOTAL ABDOMINAL HYSTERECTOMY W/ BILATERAL SALPINGOOPHORECTOMY  1994   age 37s  . WRIST SURGERY Left 2014    [name]    Assessment & Plan Clinical Impression: Patient is a 66 y.o. year old female with history of DM, HTN, peripheral neuropathy, pseudotumor cerebri, seizure disorder, one week history of right hand weakness and HA who had MRI brain done 8/12 revealing acute infarcts in left parietal cortex, white matter and occipital lobe. She was sent to the ED for full work up. History taken from chart review. Carotid dopplers done revealing L-40-59% ICA stenosis. Cardiac echo revealed EF 60-65% with no wall abnormality and grade 1 diastolic dysfunction. CTA head/neck done revealing large non-calcified plaque in proximal L-ICA causing 60-65% stenosis and likely source of emboli.   She underwent cerebral angiogram for work up on 8/16 revealing <50% L-ICA stenosis and during the procedure became unresponsive with bilateral UE tonic posturing and left sided weakness. She was taken to IR for endovascualr revascularization of B-MCA occlusions with TICI reperfusion bilaterally. Dr.  Erlinda Hong recommended DAPT x 3 months followed by Plavix alone. EEG revealed mild generalized dysfunction and intermittent BUE twitching not associated with epileptiform activity. Follow up MRI brain reviewed, showing multiple infarcts. Per report, numerous acute new supra and infratentorial infarcts involving B-MCA, and bilateral PCA- no hemorrhage. She was briefly extubated on 8/20 but developed stridor with increase WOB requiring reintubation and beside tracheostomy on 8/23 by Dr. Nelda Marseille.   She was extubated to ATC and downsized to CFS #6 on 8/29. She is tolerating PMSV for 20 minutes with imporved phonation and breath support.  She has had issues with copious secretions and mucomyst added. Tracheal secretion positive for MSSA and she was started on cefazolin 8/22 for treatment of MSSA bronchitis v/s PNA.  She did have drop in H/H to 7.6 and transfused with one unit PRBC on 8/29. MBS done 8/30 showing dysphagia due to sensorimotor deficits and silent aspiration during swallow and remains NPO but to start therapeutic trials with ST. She is showing improvement in participation with encouragement and is tolerating activity at EOB.  CIR recommended due to decline in mobility and ability to carry out ADL tasks.  .  Patient transferred to CIR on 06/18/2017 .  Patient currently requires mod- total A with basic self-care skills secondary to muscle weakness, decreased cardiorespiratoy endurance, abnormal tone, decreased coordination and decreased motor planning, decreased visual perceptual skills, decreased initiation, decreased attention, decreased awareness, decreased problem solving, decreased safety awareness, decreased memory and delayed processing and decreased sitting balance, decreased standing balance, decreased postural control, hemiplegia and decreased balance strategies.  Prior to hospitalization, patient could complete ADLs and IADLs with modified independent .  Patient will benefit from skilled  intervention to decrease level of assist with basic self-care skills prior to discharge home with care partner.  Anticipate patient will require 24 hour supervision and minimal physical assistance and follow up home health.  OT - End of Session Activity Tolerance: Decreased this session Endurance Deficit: Yes Endurance Deficit Description: multiple rest break secondary to fatigue OT Assessment Rehab Potential (ACUTE ONLY): Good OT Barriers to Discharge: Other (comments) OT Barriers to Discharge Comments: husband with memory deficits and daughter unable to fully physically  assist at home OT Patient demonstrates impairments in the following area(s): Balance;Behavior;Cognition;Endurance;Pain;Safety;Vision OT Basic ADL's Functional Problem(s): Grooming;Bathing;Dressing;Toileting;Eating OT Transfers Functional Problem(s): Toilet OT Additional Impairment(s): Fuctional Use of Upper Extremity OT Plan OT Intensity: Minimum of 1-2 x/day, 45 to 90 minutes OT Frequency: 5 out of 7 days OT Duration/Estimated Length of Stay: 21-24 days OT Treatment/Interventions: Balance/vestibular training;Cognitive remediation/compensation;Community reintegration;Discharge planning;Functional mobility training;Psychosocial support;Therapeutic Activities;UE/LE Coordination activities;Patient/family education;Functional electrical stimulation;DME/adaptive equipment instruction;Pain management;UE/LE Strength taining/ROM;Wheelchair propulsion/positioning;Therapeutic Exercise;Self Care/advanced ADL retraining;Neuromuscular re-education OT Self Feeding Anticipated Outcome(s): supervision/set up OT Basic Self-Care Anticipated Outcome(s): supervision - min A OT Toileting Anticipated Outcome(s): min A OT Bathroom Transfers Anticipated Outcome(s): min A OT Recommendation Recommendations for Other Services: Therapeutic Recreation consult Therapeutic Recreation Interventions: Pet therapy Patient destination: Home Follow Up  Recommendations: Home health OT;24 hour supervision/assistance Equipment Recommended: To be determined   Skilled Therapeutic Intervention Upon entering the room, pt supine in bed with daughter present in room. Pt with no c/o pain this session and agreeable to OT intervention. Pt seated on EOB for bathing tasks. Pt sitting for 23 minutes with min - mod A for dynamic sitting balance. Pt requiring more assistance as she fatigues. Pt returning to supine to for LB self care secondary to fatigue. Pt donning hospital gown with assistance to thread R UE. OT educated pt and caregiver on OT purpose, POC, and goals with caregiver and pt verbalizing understanding and agreement. Pt remaining supine at end of session secondary to fatigue. Pt required max multimodal cues and choice of two for orientation with PMSV in place. Bed alarm activated and call bell within reach upon exiting the room.   OT Evaluation Precautions/Restrictions  Precautions Precautions: Fall Precaution Comments: cortrak, trach Restrictions Weight Bearing Restrictions: No General   Vital Signs Therapy Vitals Temp: 98.4 F (36.9 C) Temp Source: Oral Pulse Rate: 66 Resp: 18 BP: (!) 115/49 Patient Position (if appropriate): Lying Oxygen Therapy SpO2: 100 % O2 Device: Tracheostomy Collar O2 Flow Rate (L/min): 6 L/min FiO2 (%): 28 % Pain Pain Assessment Pain Assessment: No/denies pain Pain Score: 4  Home Living/Prior Functioning Home Living Living Arrangements: Spouse/significant other, Children Available Help at Discharge: Family, Available 24 hours/day Type of Home: House Home Access: Stairs to enter, Ramped entrance Technical brewer of Steps: 4 Entrance Stairs-Rails: Right, Left, Can reach both Home Layout: One level Bathroom Shower/Tub: Multimedia programmer: Handicapped height  Lives With: Spouse, Family Prior Function Level of Independence: Requires assistive device for independence  Able to Take  Stairs?: Yes Driving:  No Vocation: On disability Vision Baseline Vision/History:  (impaired vision at baseline) Wears Glasses: Reading only Patient Visual Report: No change from baseline Vision Assessment?: Vision impaired- to be further tested in functional context Perception  Perception: Impaired Comments: to be further tested throughout LOS Praxis Praxis: Impaired Praxis-Other Comments: to be further tested throughout LOS Cognition Overall Cognitive Status: Impaired/Different from baseline Arousal/Alertness: Awake/alert Year: 2018 Month: August Day of Week: Incorrect Memory: Impaired Memory Impairment: Decreased recall of new information Immediate Memory Recall: Sock;Blue;Bed Memory Recall:  (0/3) Attention: Sustained Sustained Attention: Impaired Sustained Attention Impairment: Verbal basic;Functional basic Awareness: Impaired Awareness Impairment: Intellectual impairment Problem Solving: Impaired Problem Solving Impairment: Verbal basic;Functional basic Executive Function:  (All areas impacted by lower level deficits) Safety/Judgment: Impaired Comments: flat affect Sensation Sensation Light Touch: Not tested Coordination Gross Motor Movements are Fluid and Coordinated: No Fine Motor Movements are Fluid and Coordinated: No Coordination and Movement Description: RUE flexor synergy Motor  Motor Motor: Abnormal tone;Hemiplegia;Abnormal postural alignment and control Mobility  Bed Mobility Bed Mobility: Rolling Right;Supine to Sit Rolling Right: 4: Min assist Supine to Sit: 3: Mod assist;With rails;HOB elevated;2: Max assist Supine to Sit Details: Verbal cues for technique;Verbal cues for precautions/safety;Verbal cues for safe use of DME/AE;Manual facilitation for weight shifting Transfers Sit to Stand: From bed;1: +1 Total assist;From elevated surface (unable to come to standing with +1 assist) Sit to Stand Details: Manual facilitation for weight shifting;Manual  facilitation for placement;Verbal cues for technique;Verbal cues for precautions/safety;Verbal cues for safe use of DME/AE  Trunk/Postural Assessment  Postural Control Postural Control: Deficits on evaluation Righting Reactions: absent Protective Responses: delayed and insufficient  Balance Balance Balance Assessed: Yes Static Sitting Balance Static Sitting - Balance Support: Bilateral upper extremity supported;Feet supported Static Sitting - Level of Assistance: 4: Min assist Dynamic Sitting Balance Dynamic Sitting - Balance Support: During functional activity Dynamic Sitting - Level of Assistance: 4: Min assist;3: Mod assist Extremity/Trunk Assessment RUE Assessment RUE Assessment: Exceptions to [facility] LUE Assessment LUE Assessment: Within Functional Limits   See Function Navigator for Current Functional Status.   Refer to Care Plan for Long Term Goals  Recommendations for other services: Therapeutic Recreation  Pet therapy   Discharge Criteria: Patient will be discharged from OT if patient refuses treatment 3 consecutive times without medical reason, if treatment goals not met, if there is a change in medical status, if patient makes no progress towards goals or if patient is discharged from hospital.  The above assessment, treatment plan, treatment alternatives and goals were discussed and mutually agreed upon: by patient and by family  Gypsy Decant 06/19/2017, 4:07 PM

## 2017-06-20 DIAGNOSIS — E1143 Type 2 diabetes mellitus with diabetic autonomic (poly)neuropathy: Secondary | ICD-10-CM

## 2017-06-20 DIAGNOSIS — N390 Urinary tract infection, site not specified: Secondary | ICD-10-CM

## 2017-06-20 DIAGNOSIS — K3184 Gastroparesis: Secondary | ICD-10-CM

## 2017-06-20 DIAGNOSIS — G932 Benign intracranial hypertension: Secondary | ICD-10-CM

## 2017-06-20 DIAGNOSIS — R339 Retention of urine, unspecified: Secondary | ICD-10-CM | POA: Insufficient documentation

## 2017-06-20 DIAGNOSIS — G441 Vascular headache, not elsewhere classified: Secondary | ICD-10-CM

## 2017-06-20 LAB — URINALYSIS, ROUTINE W REFLEX MICROSCOPIC
Bilirubin Urine: NEGATIVE
GLUCOSE, UA: 50 mg/dL — AB
KETONES UR: NEGATIVE mg/dL
Nitrite: POSITIVE — AB
PH: 6 (ref 5.0–8.0)
Protein, ur: NEGATIVE mg/dL
Specific Gravity, Urine: 1.01 (ref 1.005–1.030)

## 2017-06-20 LAB — GLUCOSE, CAPILLARY
GLUCOSE-CAPILLARY: 150 mg/dL — AB (ref 65–99)
GLUCOSE-CAPILLARY: 180 mg/dL — AB (ref 65–99)
GLUCOSE-CAPILLARY: 187 mg/dL — AB (ref 65–99)
Glucose-Capillary: 157 mg/dL — ABNORMAL HIGH (ref 65–99)
Glucose-Capillary: 174 mg/dL — ABNORMAL HIGH (ref 65–99)
Glucose-Capillary: 98 mg/dL (ref 65–99)

## 2017-06-20 MED ORDER — NITROFURANTOIN MONOHYD MACRO 100 MG PO CAPS
100.0000 mg | ORAL_CAPSULE | Freq: Two times a day (BID) | ORAL | Status: AC
  Administered 2017-06-20 – 2017-06-26 (×13): 100 mg via ORAL
  Filled 2017-06-20 (×14): qty 1

## 2017-06-20 NOTE — Progress Notes (Signed)
[facility] PHYSICAL MEDICINE & REHABILITATION     PROGRESS NOTE  Subjective/Complaints:  Patient seen lying in bed this morning. Per family, patient slept well overnight. He states she did fairly well with therapies yesterday.  ROS: Denies CP, SOB, nausea, vomiting, diarrhea.  Objective: Vital Signs: Blood pressure (!) 117/52, pulse 73, temperature 98.2 F (36.8 C), temperature source Oral, resp. rate 18, height 5\' 4"  (1.626 m), weight 76.3 kg (168 lb 3.4 oz), SpO2 100 %. No results found.  Recent Labs  06/18/17 0459 06/19/17 1232  WBC 11.3* 11.4*  HGB 9.4* 9.4*  HCT 30.3* 30.7*  PLT 390 396    Recent Labs  06/18/17 0459 06/19/17 1232  NA 140 135  K 4.0 4.2  CL 106 104  GLUCOSE 155* 146*  BUN 17 30*  CREATININE 0.77 0.87  CALCIUM 8.7* 8.7*   CBG (last 3)   Recent Labs  06/19/17 2008 06/20/17 0001 06/20/17 0426  GLUCAP 200* 118* 157*    Wt Readings from Last 3 Encounters:  06/20/17 76.3 kg (168 lb 3.4 oz)  06/18/17 76.4 kg (168 lb 6.9 oz)  01/15/16 79.3 kg (174 lb 12.8 oz)    Physical Exam:  BP (!) 117/52 (BP Location: Left Arm)   Pulse 73   Temp 98.2 F (36.8 C) (Oral)   Resp 18   Ht 5\' 4"  (1.626 m)   Wt 76.3 kg (168 lb 3.4 oz)   SpO2 100%   BMI 28.87 kg/m  Constitutional: She appears well-developed and well-nourished.  HENT: Normocephalic and atraumatic.  Eyes: EOM are normal. No discharge Neck: CFS #6 in place with brownish secretions in flange.  Cardiovascular: RRR. No JVD Respiratory: Effort normal. +rhonchi, upper airway sounds.  GI: Soft. Bowel sounds are normal.  Musculoskeletal: She exhibits no edema or tenderness.  Neurological: Alert  Left facial droop with dysarthric speech.  Right sided weakness with decrease in motor control.  RUE grossly 3/5 (stable).  RLE grossly 4-/5.  Skin: Skin is warm and dry. She is not diaphoretic.  Psychiatric: Her affect is blunt. Her speech is delayed. She is slowed. Cognition and memory are  impaired.   Assessment/Plan: 1. 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 Physiatrist is providing close team supervision and 24 hour management of active medical problems listed below. Physiatrist and rehab team continue to assess barriers to discharge/monitor patient progress toward functional and medical goals.  Function:  Bathing Bathing position   Position: Sitting EOB  Bathing parts Body parts bathed by patient: Right arm, Chest, Abdomen, Front perineal area, Right upper leg, Left upper leg Body parts bathed by helper: Left arm, Buttocks, Right lower leg, Left lower leg, Back  Bathing assist Assist Level:  (mod A)      Upper Body Dressing/Undressing Upper body dressing   What is the patient wearing?: Hospital gown                Upper body assist Assist Level: Set up, Supervision or verbal cues   Set up : To obtain clothing/put away  Lower Body Dressing/Undressing Lower body dressing   What is the patient wearing?: Non-skid slipper socks           Non-skid slipper socks- Performed by helper: Don/doff right sock, Don/doff left sock                  Lower  body assist Assist for lower body dressing: Touching or steadying assistance (Pt > 75%)      Toileting Toileting Toileting activity did not occur: No continent bowel/bladder event        Toileting assist     Transfers Chair/bed transfer   Chair/bed transfer method: Squat pivot Chair/bed transfer assist level: Total assist (Pt < 25%) Chair/bed transfer assistive device: Armrests     Locomotion Ambulation Ambulation activity did not occur: Safety/medical concerns         Wheelchair          Cognition Comprehension Comprehension assist level: Understands basic 50 - 74% of the time/ requires cueing 25 - 49% of the time  Expression  Expression assist level: Expresses basic 50 - 74% of the time/requires cueing 25 - 49% of the time. Needs to repeat parts of sentences.  Social Interaction Social Interaction assist level: Interacts appropriately 50 - 74% of the time - May be physically or verbally inappropriate.  Problem Solving Problem solving assist level: Solves basic 50 - 74% of the time/requires cueing 25 - 49% of the time  Memory Memory assist level: Recognizes or recalls 50 - 74% of the time/requires cueing 25 - 49% of the time    Medical Problem List and Plan:  1. Right hemiparesis, functional and cognitive deficits secondary to left parietal-occipital embolic infarcts from left ICA thrombus   Cont CIR 2. DVT Prophylaxis/Anticoagulation: Pharmaceutical: Lovenox  3. Headaches/Pain Management:   Topamax started 9/1 4. Mood: LCSW to follow for evaluation and support.  5. Neuropsych: This patient is not capable of making decisions on her own behalf.  6. Skin/Wound Care: Routine pressure relief measures.  7. Fluids/Electrolytes/Nutrition: Monitor I/Os  BMP within acceptable range on 8/31 8. MSSA PNA: Completed treatment 7 day course of cefazolin on 8/30. Continue to monitor with strict aspiration precautions--HOB > 30 degrees at all times.  9. VDRF: Tolerating PMSV with supervision. Secretions felt to be in part due to GERD.  10 HTN: Monitor BP bid--on lopressor bid and catapres  Relatively controlled on 9/2 11. ABLA: Improved with transfusion.   Hemoglobin 9.4 on 9/1  Continue to monitor 12. Anxiety disorder:Team to provide ego support. Daughter staying to encourage patient. Palliative following for support and placed on IV ativan tid for symptoms management--decrease to prn due to sedation.  13. T2DM: Hgb A1C- Continue to titrated lantus insulin for tighter control. Monitor BS ac/hs and use SSI for elevations.   Monitor with increased mobility 14. GERD: on pepcid and domperitone  15. 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 Leucocytosis: persistent .Monitor for signs of infection.   WBCs 11.4 on 9/1  Afebrile  Continue to monitor 17. Pseudotumor Cerebri: Continue Diamox  18. B-MCA stenosis/L-ICA stenosis s/p revascularization: On ASA/Plavix.  43. Acute urinary retention, now with UTI:   Will d/c foley  UA+, Ucx pending  Empiric Macrobid started 9/1  LOS (Days) 2 A FACE TO FACE EVALUATION WAS PERFORMED  [name] Lorie Phenix 06/20/2017 7:39 AM

## 2017-06-21 ENCOUNTER — Inpatient Hospital Stay (HOSPITAL_COMMUNITY): Payer: Medicare Other | Admitting: Occupational Therapy

## 2017-06-21 ENCOUNTER — Inpatient Hospital Stay (HOSPITAL_COMMUNITY): Payer: Medicare Other | Admitting: Physical Therapy

## 2017-06-21 ENCOUNTER — Inpatient Hospital Stay (HOSPITAL_COMMUNITY): Payer: Medicare Other | Admitting: Speech Pathology

## 2017-06-21 LAB — GLUCOSE, CAPILLARY
GLUCOSE-CAPILLARY: 109 mg/dL — AB (ref 65–99)
GLUCOSE-CAPILLARY: 207 mg/dL — AB (ref 65–99)
GLUCOSE-CAPILLARY: 178 mg/dL — AB (ref 65–99)
Glucose-Capillary: 85 mg/dL (ref 65–99)
Glucose-Capillary: 282 mg/dL — ABNORMAL HIGH (ref 65–99)

## 2017-06-21 LAB — URINE CULTURE

## 2017-06-21 MED ORDER — TOPIRAMATE 25 MG PO TABS
50.0000 mg | ORAL_TABLET | Freq: Every day | ORAL | Status: DC
  Administered 2017-06-21 – 2017-06-23 (×3): 50 mg via ORAL
  Filled 2017-06-21 (×3): qty 2

## 2017-06-21 MED ORDER — LIDOCAINE HCL 2 % EX GEL: CUTANEOUS | Status: DC | PRN

## 2017-06-21 MED ORDER — INSULIN GLARGINE 100 UNIT/ML ~~LOC~~ SOLN
20.0000 [IU] | Freq: Every day | SUBCUTANEOUS | Status: DC
  Administered 2017-06-22 – 2017-06-23 (×3): 20 [IU] via SUBCUTANEOUS
  Filled 2017-06-21 (×3): qty 0.2

## 2017-06-21 NOTE — Progress Notes (Signed)
Social Work Assessment and Plan Social Work Assessment and Plan  Patient Details  Name: [name] MRN: [id number] Date of Birth: 29-Mar-1951  Today's Date: 06/21/2017  Problem List:  Patient Active Problem List   Diagnosis Date Noted  . Acute lower UTI   . Urinary retention   . Diabetic gastroparesis ([place])   . Vascular headache   . Diabetes mellitus ([place])   . CVA (cerebral vascular accident) ([place]) 06/18/2017  . Weakness   . 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 Right hemiparesis ([place])   . DNR (do not resuscitate) discussion 06/16/2017  . Palliative care by specialist 06/16/2017  . Anxiety state   . Acute embolic stroke ([place])   . Benign essential HTN   . Diabetic peripheral neuropathy ([place])   . Pseudotumor cerebri   . Hypokalemia   . Seizure ([place])   . Leukocytosis   . Acute blood loss anemia   . Post-operative pain   . Encounter for feeding tube placement   . Respiratory failure ([place])   . Status post tracheostomy ([place])   . Acute respiratory failure with hypoxemia ([place])   . Stenosis of left carotid artery   . Acute CVA (cerebrovascular accident) ([place]) 05/31/2017  . Type II diabetes mellitus with complication ([place]) 62/56/3893  . Hypertension 05/31/2017  . Hyponatremia 05/31/2017  . IIH (idiopathic intracranial hypertension) 01/18/2016  . Malfunction of ventriculo-peritoneal shunt ([place]) 01/18/2016  . 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 Worsening headaches 01/18/2016  . Increased abdominal girth 01/18/2016  . Abdominal fluid collection 01/18/2016  . Vision changes 01/18/2016  . Perceived hearing changes 01/18/2016  . Nausea without vomiting 01/18/2016  . Peripheral vision loss 01/18/2016  . Imbalance 01/18/2016  . Falls 01/18/2016  . Dysphagia, oropharyngeal 01/18/2016  . Subacute confusional state 01/18/2016  . Left breast mass 05/05/2011   Past Medical History:  Past Medical History:  Diagnosis Date  . Anemia    takes iron  supplement  . Anesthesia complication    disorientation due to pseudotumor  . Diabetes mellitus    IDDM  . Family history of anesthesia complication 68 yrs ago   brother stopped breathing for a minute or two  . GERD (gastroesophageal reflux disease)   . Headache(784.0)    due to pseudotumor; daily headache  . Hypercholesteremia    unable to tolerate statins  . Hypertension    under control with meds., has been on med. x 5 yr.  . Peripheral neuropathy   . PONV (postoperative nausea and vomiting)   . 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 Seizures ([place])    due to shunt failure; last seizure 2007  . Synovitis of ankle 04/2013   left  . Wears dentures    full   Past Surgical History:  Past Surgical History:  Procedure Laterality Date  . ANKLE ARTHROSCOPY Left 05/18/2013   Procedure: LEFT ANKLE ARTHROSCOPY WITH DEBRIDEMENT,  SUBTALAR OPEN DEBRIDEMENT ;  Surgeon: Wylene Simmer, MD;  Location: [facility];  Service: Orthopedics;  Laterality: Left;  . APPENDECTOMY     as a child  . BALLOON DILATION N/A 07/04/2013   Procedure: BALLOON DILATION;  Surgeon: Garlan Fair, MD;  Location: [facility];  Service: Endoscopy;  Laterality: N/A;  . BLADDER SUSPENSION    . BREAST LUMPECTOMY W/ NEEDLE LOCALIZATION Left 05/22/2011  . CAROTID ANGIOGRAPHY Bilateral 06/03/2017   Procedure: Bilateral Carotid Angiography;  Surgeon: Conrad Cannelburg, MD;  Location: [facility];  Service: Cardiovascular;  Laterality: Bilateral;  . COLONOSCOPY WITH PROPOFOL N/A 05/14/2014   Procedure: COLONOSCOPY WITH PROPOFOL;  Surgeon: Garlan Fair, MD;  Location: [facility];  Service: Endoscopy;  Laterality: N/A;  . ESOPHAGEAL DILATION  06/23/2006; 08/05/2004  . ESOPHAGOGASTRODUODENOSCOPY N/A 07/04/2013   Procedure: ESOPHAGOGASTRODUODENOSCOPY (EGD);  Surgeon: Garlan Fair, MD;  Location: [facility];  Service: Endoscopy;  Laterality: N/A;  . ESOPHAGOGASTRODUODENOSCOPY (EGD) WITH  PROPOFOL N/A 05/14/2014   Procedure: ESOPHAGOGASTRODUODENOSCOPY (EGD) WITH PROPOFOL;  Surgeon: Garlan Fair, MD;  Location: [facility];  Service: Endoscopy;  Laterality: N/A;  . IR ANGIO VERTEBRAL SEL SUBCLAVIAN INNOMINATE UNI R MOD SED  06/03/2017  . IR PERCUTANEOUS ART THROMBECTOMY/INFUSION INTRACRANIAL INC DIAG ANGIO  06/03/2017  . IR PERCUTANEOUS ART THROMBECTOMY/INFUSION INTRACRANIAL INC DIAG ANGIO  06/03/2017  . LUMBAR PERITONEAL SHUNT     x 2  . OVARIAN CYST REMOVAL     age 89  . RADIOLOGY WITH ANESTHESIA N/A 06/03/2017   Procedure: RADIOLOGY WITH ANESTHESIA;  Surgeon: Luanne Bras, MD;  Location: [facility];  Service: Radiology;  Laterality: N/A;  . SHUNT REVISION  2007  . TOTAL ABDOMINAL HYSTERECTOMY W/ BILATERAL SALPINGOOPHORECTOMY  1994   age 61s  . WRIST SURGERY Left 2014    [name]   Social History:  reports that she has never smoked. She has never used smokeless tobacco. She reports that she does not drink alcohol or use drugs.  Family / Support Systems Marital Status: Married Patient Roles: Spouse, Parent Spouse/Significant OtherSimona Huh [phone]  [phone] Children: Clifton Kovacic-daughter [phone]  [phone] Other Supports: Son who is local getting married in a few weeks Anticipated Caregiver: Family Ability/Limitations of Caregiver: All work so will need to flex their schedules-made aware pt will need 24 hr care upon DC Caregiver Availability: Other (Comment) (Working on a plan for DC) Family Dynamics: Very close family-two adult children are local and involved. Daughter lives with parents and is supportive. Pt has siblings who live out of town but are here visiting today. They do have church members who are suportive also  Social History Preferred language: English Religion: Catholic Cultural Background: No issues Education: [employer] Read: Yes Write: Yes Employment Status: Disabled Freight forwarder Issues: No  issues Guardian/Conservator: None-according to MD pt is not fully capable of making her own decisions at this time. Will look toward her husband since there is no formal POA in place.   Abuse/Neglect Physical Abuse: Denies Verbal Abuse: Denies Sexual Abuse: Denies Exploitation of patient/patient's resources: Denies Self-Neglect: Denies  Emotional Status Pt's affect, behavior adn adjustment status: Pt has always been independent even with her health issues and wants to get back there again. Husband and daughter assisted in completing this evaluation. Pt will do her best, her main goal is to eat she is hungry for some real food. Recent Psychosocial Issues: other health issues which she was disabled from.  Pyschiatric History: No history deferred depression screen at this time due to exhausted from am therapies, falling asleep in between session. Do feels he would benefit from seeing neuro-psych during her stay here for coping. Husband and daughter voiced she was good before this happened. Substance Abuse History: No issues  Patient / Family Perceptions, Expectations & Goals Pt/Family understanding of illness & functional limitations: Pt and family can explain her health issues and what has happened to her while hospitalized. They do not want to be kept updated on her medical issues and expect to talk  with a MD every other day while here. Premorbid pt/family roles/activities: Wife, Mom, retiree, sister, friend, church member, etc Anticipated changes in roles/activities/participation: resume Pt/family expectations/goals: Pt states: " I want to eat, lobster and pizza sound good."  Husband states: " She is wanting certain places, I don't blame her."  Daughter states: " We all hope she does well here."  [employer]: None Premorbid Home Care/DME Agencies: None Transportation available at discharge: [employer] referrals recommended: Neuropsychology, Support group  (specify)  Discharge Planning Living Arrangements: Spouse/significant other, Children Support Systems: Spouse/significant other, Children, Other relatives, Friends/neighbors, Social worker community Type of Residence: Private residence Insurance Resources: Multimedia programmer (specify) Primary school teacher) Financial Resources: Family Support, SSD Financial Screen Referred: No Living Expenses: Own Money Management: Spouse, Patient Does the patient have any problems obtaining your medications?: No Home Management: Patient and daughter Patient/Family Preliminary Plans: Return home with husband and daughter who have been told she will require 24 hr physical assist, but both work. Husband works part time and daughter works Biochemist, clinical, so will need to come up with a plan for this. Once team conference and have a target discharge date this may push them to realistically decide a plan. Sw Barriers to Discharge: Decreased caregiver support Sw Barriers to Discharge Comments: Family members work will need to come up with a plan Social Work Anticipated Follow Up Needs: HH/OP, Support Group  Clinical Impression Pleasant female who looks to be doing well with all she has been through. Her family-husband and daughter are very supportive and involved in her care. Extended family members are here today and visiting. Need to work on a realistic plan for home due to pt will require 24 hr physical care and family will need to decide who will be able to provide this. Will work on a safe discharge plan for pt. Made family aware insurance will  Not cover 8 hours of care at home while they work.  Elease Hashimoto 06/21/2017, 1:22 PM

## 2017-06-21 NOTE — IPOC Note (Signed)
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 (HCC)   Right hemiparesis ([place])   Diabetes mellitus ([place])   Acute lower UTI   Urinary retention   Diabetic gastroparesis ([place])   Vascular headache     Functional Problem List: Nursing Bladder, Bowel, Nutrition, Safety, Endurance, Medication Management, Skin Integrity, Motor, Pain  PT Balance, Endurance, Motor, Perception, Safety  OT Balance, Behavior, Cognition, Endurance, Pain, Safety, Vision  SLP Cognition, Endurance, Linguistic, Perception, Safety  TR         Basic ADL's: OT Grooming, Bathing, Dressing, Toileting, Eating     Advanced  ADL's: OT       Transfers: PT Bed Mobility, Bed to Chair, Car, [employer]  OT Toilet     Locomotion: PT Stairs, Emergency planning/management officer, Ambulation     Additional Impairments: OT Fuctional Use of Upper Extremity  SLP Swallowing, Communication, Social Cognition comprehension, expression Social Interaction, Problem Solving, Memory, Attention, Awareness  TR      Anticipated Outcomes Item Anticipated Outcome  Self Feeding supervision/set up  Swallowing  Min A with least restrictive   Basic self-care  supervision - min A  Toileting  min A   Bathroom Transfers min A  Bowel/Bladder  Continent bowel and bladder, no s/s infection, no retention. Min assist  Transfers  supervision  Locomotion  min assist ambulatory in controlled environment, supervision w/c level for home/community  Communication  Min A  Cognition  Min A  Pain  Managed at goalm 2/10  Safety/Judgment  Increased safety awareness, no falls, injury this admission   Therapy Plan: PT Intensity: Minimum of 1-2 x/day ,45 to 90 minutes PT Frequency: 5  out of 7 days PT Duration Estimated Length of Stay: 21-24 days OT Intensity: Minimum of 1-2 x/day, 45 to 90 minutes OT Frequency: 5 out of 7 days OT Duration/Estimated Length of Stay: 21-24 days SLP Intensity: Minumum of 1-2 x/day, 30 to 90 minutes SLP Frequency: 3 to 5 out of 7 days SLP Duration/Estimated Length of Stay: 3 weeks    Team Interventions: Nursing Interventions Patient/Family Education, Disease Management/Prevention, Skin Care/Wound Management, Bladder Management, Bowel Management, Pain Management, Medication Management, Cognitive Remediation/Compensation, Psychosocial Support, Dysphagia/Aspiration Precaution Training, Discharge Planning  PT interventions Ambulation/gait training, Training and development officer, Cognitive remediation/compensation, Discharge planning, Community reintegration, DME/adaptive equipment instruction, Functional mobility training, Patient/family education, Neuromuscular re-education, Psychosocial support, Splinting/orthotics, Skin care/wound management, UE/LE Strength taining/ROM, Stair training, Wheelchair propulsion/positioning, UE/LE Coordination activities, Therapeutic Activities, Therapeutic Exercise, Visual/perceptual remediation/compensation  OT Interventions Training and development officer, Cognitive remediation/compensation, Community reintegration, Discharge planning, Functional mobility training, Psychosocial support, Therapeutic Activities, UE/LE Coordination activities, Patient/family education, Functional electrical stimulation, DME/adaptive equipment instruction, Pain management, UE/LE Strength taining/ROM, Wheelchair propulsion/positioning, Therapeutic Exercise, Self Care/advanced ADL retraining, Neuromuscular re-education  SLP Interventions Cognitive remediation/compensation, Cueing hierarchy, Dysphagia/aspiration precaution training, Functional tasks, Patient/family education, Therapeutic Activities, Internal/external aids  TR Interventions    SW/CM  Interventions Discharge Planning, Psychosocial Support, Patient/Family Education   Barriers to Discharge MD  Medical stability and Nutritional means  Nursing Lack of/limited family support    PT Trach    OT Other (comments) husband with memory deficits and daughter unable to fully physically  assist at home  SLP Nutrition means    SW       Team  Discharge Planning: Destination: PT-Home ,OT- Home , SLP-Home Projected Follow-up: PT-Home health PT, 24 hour supervision/assistance, OT-  Home health OT, 24 hour supervision/assistance, SLP-24 hour supervision/assistance, Home Health SLP Projected Equipment Needs: PT-To be determined, OT- To be determined, SLP-To be determined Equipment Details: PT- , OT-  Patient/family involved in discharge planning: PT- Patient,  OT-Patient, Family member/caregiver, SLP-Patient, Family member/caregiver  MD ELOS: 21-24 days Medical Rehab Prognosis:  Excellent Assessment: The patient has been admitted for CIR therapies with the diagnosis of embolic left carotid origin CVA. The team will be addressing functional mobility, strength, stamina, balance, safety, adaptive techniques and equipment, self-care, bowel and bladder mgt, patient and caregiver education, NMR, swallowing, trach mgt, cognition, communication, pain mgt, ego support. Goals have been set at min assist for mobility, self-care, cognition, swallowing. Daughter extremely supportive.    Meredith Staggers, MD, FAAPMR      See Team Conference Notes for weekly updates to the plan of care

## 2017-06-21 NOTE — Care Management Note (Signed)
Inpatient Rehabilitation Center Individual Statement of Services  Patient Name:  [name]  Date:  06/21/2017  Welcome to the [facility].  Our goal is to provide you with an individualized program based on your diagnosis and situation, designed to meet your specific needs.  With this comprehensive rehabilitation program, you will be expected to participate in at least 3 hours of rehabilitation therapies Monday-Friday, with modified therapy programming on the weekends.  Your rehabilitation program will include the following services:  Physical Therapy (PT), Occupational Therapy (OT), Speech Therapy (ST), 24 hour per day rehabilitation nursing, Neuropsychology, Case Management (Social Worker), Rehabilitation Medicine, Nutrition Services and Pharmacy Services  Weekly team conferences will be held on Wednesday to discuss your progress.  Your Social Worker will talk with you frequently to get your input and to update you on team discussions.  Team conferences with you and your family in attendance may also be held.  Expected length of stay: 21-24-days  Overall anticipated outcome: min assist with some supervision level  Depending on your progress and recovery, your program may change. Your Social Worker will coordinate services and will keep you informed of any changes. Your Social Worker's name and contact numbers are listed  below.  The following services may also be recommended but are not provided by the [facility] will be made to provide these services after discharge if needed.  Arrangements include referral to agencies that provide these services.  Your insurance has been verified to be:  [employer]-Medicare Your primary doctor is:  Radio broadcast assistant  Pertinent information will be shared with your doctor and your insurance  company.  Social Worker:  Ovidio Kin, [place] or ([phone]  Information discussed with and copy given to patient by: Elease Hashimoto, 06/21/2017, 10:45 AM

## 2017-06-21 NOTE — Progress Notes (Signed)
[facility] PHYSICAL MEDICINE & REHABILITATION     PROGRESS NOTE  Subjective/Complaints:  Daughter states patient has had headache since stroke. Pt indicates it's over her eyes. Slept ok last night  ROS: limited  Objective:  Vital Signs: Blood pressure (!) 118/50, pulse 78, temperature 97.9 F (36.6 C), temperature source Oral, resp. rate 18, height 5\' 4"  (1.626 m), weight 75.7 kg (166 lb 14.2 oz), SpO2 100 %. No results found.  Recent Labs  06/19/17 1232  WBC 11.4*  HGB 9.4*  HCT 30.7*  PLT 396    Recent Labs  06/19/17 1232  NA 135  K 4.2  CL 104  GLUCOSE 146*  BUN 30*  CREATININE 0.87  CALCIUM 8.7*   CBG (last 3)   Recent Labs  06/20/17 2346 06/21/17 0346 06/21/17 0807  GLUCAP 187* 109* 207*    Wt Readings from Last 3 Encounters:  06/21/17 75.7 kg (166 lb 14.2 oz)  06/18/17 76.4 kg (168 lb 6.9 oz)  01/15/16 79.3 kg (174 lb 12.8 oz)    Physical Exam:  BP (!) 118/50 (BP Location: Left Arm)   Pulse 78   Temp 97.9 F (36.6 C) (Oral)   Resp 18   Ht 5\' 4"  (1.626 m)   Wt 75.7 kg (166 lb 14.2 oz)   SpO2 100%   BMI 28.65 kg/m  Constitutional: She appears well-developed and well-nourished.  HENT: Normocephalic and atraumatic.  Eyes: EOM are normal. No discharge Neck: CFS #6 without drainage today Cardiovascular: RRR. No JVD Respiratory: normal effort. Fair flow, rhonchi heard GI: Soft. Bowel sounds are normal.  Musculoskeletal: She exhibits no edema or tenderness.  Neurological: Alert  Left facial droop with dysarthric speech.  Right sided weakness with decrease in motor control.  RUE grossly 3- to 3/5.Marland Kitchen  RLE grossly 4-/5.  Skin: Skin is warm and dry. She is not diaphoretic.  Psychiatric: affect flat, processing delayed Assessment/Plan: 1. 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 Physiatrist is providing close team supervision and 24 hour management of active medical problems listed below. Physiatrist and rehab team continue to assess barriers to discharge/monitor patient progress toward functional and medical goals.  Function:  Bathing Bathing position   Position: Sitting EOB  Bathing parts Body parts bathed by patient: Right arm, Chest, Abdomen, Front perineal area, Right upper leg, Left upper leg Body parts bathed by helper: Left arm, Buttocks, Right lower leg, Left lower leg, Back  Bathing assist Assist Level:  (mod A)      Upper Body Dressing/Undressing Upper body dressing   What is the patient wearing?: Hospital gown                Upper body assist Assist Level: Set up, Supervision or verbal cues   Set up : To obtain clothing/put away  Lower Body Dressing/Undressing Lower body dressing   What is the patient wearing?: Non-skid slipper socks           Non-skid slipper socks- Performed by helper: Don/doff right sock, Don/doff left sock                  Lower body assist Assist for lower body dressing: Touching or steadying assistance (Pt > 75%)      Toileting Toileting Toileting activity did not occur: No continent bowel/bladder event        Toileting assist  Transfers Chair/bed transfer   Chair/bed transfer method: Squat pivot Chair/bed transfer assist level: Total assist (Pt < 25%) Chair/bed transfer assistive device: Armrests     Locomotion Ambulation Ambulation activity did not occur: Safety/medical concerns         Wheelchair          Cognition Comprehension Comprehension assist level: Understands basic 90% of the time/cues < 10% of the time  Expression Expression assist level: Expresses basic 50 - 74% of the time/requires cueing 25 - 49% of the time. Needs to repeat parts of sentences.  Social Interaction Social Interaction assist level: Interacts appropriately 50 - 74% of the time - May be  physically or verbally inappropriate.  Problem Solving Problem solving assist level: Solves basic 50 - 74% of the time/requires cueing 25 - 49% of the time  Memory Memory assist level: Recognizes or recalls 50 - 74% of the time/requires cueing 25 - 49% of the time    Medical Problem List and Plan:  1. Right hemiparesis, functional and cognitive deficits secondary to left parietal-occipital embolic infarcts from left ICA thrombus   Cont CIR 2. DVT Prophylaxis/Anticoagulation: Pharmaceutical: Lovenox  3. Headaches/Pain Management:   Topamax started 9/1--pt with persistent headaches   -increase to 50mg  and change to evening dosing 4. Mood: LCSW to follow for evaluation and support.  5. Neuropsych: This patient is not capable of making decisions on her own behalf.  6. Skin/Wound Care: Routine pressure relief measures.  7. Fluids/Electrolytes/Nutrition: Monitor I/Os  BMP within acceptable range on 8/31 8. MSSA PNA: Completed treatment 7 day course of cefazolin on 8/30. Continue to monitor with strict aspiration precautions--HOB > 30 degrees at all times.  9. VDRF: Tolerating PMSV with supervision. Secretions felt to be in part due to GERD.   -continue #6 trach until secretion mgt/swallow better 10 HTN: Monitor BP bid--on lopressor bid and catapres  bp controlled 9/3 11. ABLA: Improved with transfusion.   Hemoglobin 9.4 on 9/1  Continue to monitor 12. Anxiety disorder:Team to provide ego support. Daughter staying to encourage patient. Palliative following for support and placed on IV ativan tid for symptoms management--decrease to prn due to sedation.  13. T2DM: Hgb A1C-  -poor control while on TF  -increase lantus to 20 u daily  -continue scheduled novolog 14. 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 Leucocytosis: persistent .Monitor for signs of infection.   WBCs 11.4 on 9/1  Afebrile  Continue to monitor 17. Pseudotumor Cerebri: Continue Diamox  TID 18. B-MCA stenosis/L-ICA stenosis s/p revascularization: On ASA/Plavix.  19. Acute urinary retention:   Foley out  Ua+, ucx with multispecies  Empiric Macrobid started 9/1. Will stop and send urine for re-culture  LOS (Days) 3 A FACE TO FACE EVALUATION WAS PERFORMED  [name] T 06/21/2017 11:05 AM

## 2017-06-21 NOTE — Progress Notes (Addendum)
Patient complaining of increased pain in right ear, she was being treated for an ear infection prior to coming to [facility] from [facility] [place].  Patient was being treated with Ciprodex 0.3-0.1% otic suspension 4 drops to right ear bid. P Love, PA notified.

## 2017-06-21 NOTE — Progress Notes (Signed)
Physical Therapy Session Note  Patient Details  Name: [name] MRN: [id number] Date of Birth: Oct 07, 1951  Today's Date: 06/21/2017 PT Individual Time: 1300-1405 PT Individual Time Calculation (min): 65 min   Short Term Goals: Week 1:  PT Short Term Goal 1 (Week 1): Pt will transfer with max assist PT Short Term Goal 2 (Week 1): Pt will initiate gait training with PT PT Short Term Goal 3 (Week 1): Pt will tolerate out of bed in w/c x2 hours outside of therapy time.   Skilled Therapeutic Interventions/Progress Updates: Pt presented in bed c/o HA and not wanting to participate in therapy but agreeable to attempt sitting at EOB with maximum encouragement form PTA and pt's husband. Pt performed supine to sit total A with maxA to scoot to EOB. Pt performed several trials of reaching across midline and forward but limited reach. Performed AALAQ LLE x 5 and AROM LAQ x 10 LLE.  Attempted manual positioning of head to neutral with pt c/o increased pain. Pt tolerated sitting at EOB with modA fading to minA x apporx 8 min before c/o dizziness and pt stating increased HA. Pt returned to supine mod/maxA requiring assist for truncal support/positioning and LE placement. BP checked 102/59. Pt noted to once in supine pt with increased pain in RLE and noted flexor spasm. Pt able to perform LLE AROM including ankle pumps, heel slides, hip abd/add, SLR, and SAQ x 10. Pt indicating need to void, able to perform bridge with PTA and husband supporting BLE while PTA removed brief (+urinary incontinence) and bedpan placed (+void). After peri-hygiene pt performed rolling L/R while PTA donned fresh brief and pt repositioned for comfort. Pt left with bed alarm on, call bell within reach, and husband present.      Therapy Documentation Precautions:  Precautions Precautions: Fall Precaution Comments: cortrak, trach Restrictions Weight Bearing Restrictions: No General: PT Amount of Missed Time (min): 10 Minutes PT  Missed Treatment Reason: Patient fatigue Vital Signs: Therapy Vitals Temp: 98.1 F (36.7 C) Temp Source: Oral Pulse Rate: 67 Resp: 18 BP: 118/60 Patient Position (if appropriate): Lying Oxygen Therapy SpO2: 100 % O2 Device: Tracheostomy Collar Pain: Pain Assessment Pain Assessment: Faces Faces Pain Scale: Hurts whole lot Pain Type: Acute pain Pain Location: Head Pain Descriptors / Indicators: Aching Pain Onset: On-going Pain Intervention(s): Relaxation;RN made aware;Rest  See Function Navigator for Current Functional Status.   Therapy/Group: Individual Therapy  Babak Lucus  Kosei Rhodes, PTA  06/21/2017, 3:52 PM

## 2017-06-21 NOTE — Progress Notes (Signed)
Speech Language Pathology Daily Session Note  Patient Details  Name: [name] MRN: [id number] Date of Birth: April 30, 1951  Today's Date: 06/21/2017 SLP Individual Time: 0730-0825 SLP Individual Time Calculation (min): 55 min  Short Term Goals: Week 1: SLP Short Term Goal 1 (Week 1): Pt will tolerate PMSV without decline in vitals during all waking hours.  SLP Short Term Goal 2 (Week 1): Pt will consume trials of honey thick via spoon and puree with minimal overt s/s of aspiration to demonstrate readiness for repeat instrumental swallow study. SLP Short Term Goal 3 (Week 1): Pt will increase vocal intensity with Mod A cues to achieve ~ 90% intelligiblity at the phrase level.  SLP Short Term Goal 4 (Week 1): Pt will increase sustained attention to task for ~ 20 minutes with Mod A cues.  SLP Short Term Goal 5 (Week 1): Pt will demonstrate basic problem solving for familiar tasks with Mod A cues.   Skilled Therapeutic Interventions: Skilled treatment session focused on speech and dysphagia goals. Upon arrival, SLP donned PMSV. Patient tolerated PMSV throughout the session with all vitals remaining WFL and without signs of breath stacking. However, patient required overall Mod A verbal cues to utilize an increased vocal intensity to achieve ~75% intelligible at the phrase and sentence level. Patient consumed minimal amounts of PO trials despite Max encouragement from clinician and family. Patient consumed trials of honey-thick liquids via tsp without overt s/s of aspiration and Min A verbal cues for use of a chin tuck. Recommend continued trials with SLP only. Patient left upright in bed with PMSV in place and daughter present. Continue with current plan of care.      Function:  Eating Eating   Modified Consistency Diet: Yes Eating Assist Level: Helper feeds patient           Cognition Comprehension Comprehension assist level: Understands basic 90% of the time/cues < 10% of the time   Expression Expression assistive device: Talk trach valve Expression assist level: Expresses basic 50 - 74% of the time/requires cueing 25 - 49% of the time. Needs to repeat parts of sentences.  Social Interaction Social Interaction assist level: Interacts appropriately 50 - 74% of the time - May be physically or verbally inappropriate.  Problem Solving Problem solving assist level: Solves basic 50 - 74% of the time/requires cueing 25 - 49% of the time  Memory Memory assist level: Recognizes or recalls 50 - 74% of the time/requires cueing 25 - 49% of the time    Pain No/Denies Pain   Therapy/Group: Individual Therapy  Alven Alverio 06/21/2017, 12:26 PM

## 2017-06-21 NOTE — Progress Notes (Signed)
Occupational Therapy Session Note  Patient Details  Name: [name] MRN: [id number] Date of Birth: 1950/11/11  Today's Date: 06/21/2017 OT Individual Time: 1103-1203 OT Individual Time Calculation (min): 60 min    Short Term Goals: Week 1:  OT Short Term Goal 1 (Week 1): Pt will perform toilet transfer with max A. OT Short Term Goal 2 (Week 1): Pt will perform LB dressing with max A in order to decrease level of assistance with self care.  OT Short Term Goal 3 (Week 1): Pt will perform UB dressing with mod A and utilizing modifications as needed.  Pt apeutic Interventions/Progress Updates:    Pt received supine in bed, reporting increased fatigue and headache but agreeable to OT tx session and reporting willingness to push through to participate in session, requiring frequent rest breaks. Focus of session on ADL retraining and bed mobility. Pt completed bathing and dressing ADLs at bed level per Pt request, utilizing LUE to wash 3/10 body parts, HOH assist provided for use of RUE during task, and therapist assist for additional body parts. Pt requires MaxA for UB and LB dressing, able to move extremities to assist with threading into clothing items, however ultimately requiring assist to complete all steps. Pt rolls side to side with Min-ModA throughout to advance shorts over hips and to pull shirt over trunk/back. Trach Collar O2 utilized throughout and O2 sats remaining at 99-100%. Pt's daughter and husband present during session and providing encouragement throughout. Pt left supine in bed, call bell and needs within reach, family present.   Therapy Documentation Precautions:  Precautions Precautions: Fall Precaution Comments: cortrak, trach Restrictions Weight Bearing Restrictions: No   Pain: Pain Assessment Pain Assessment: Faces Faces Pain Scale: Hurts whole lot Pain Type: Acute pain Pain Location: Head Pain Descriptors / Indicators: Aching Pain Frequency:  Occasional Pain Onset: On-going Patients Stated Pain Goal: 0 Pain Intervention(s): Relaxation;RN made aware;Rest  See Function Navigator for Current Functional Status.   Therapy/Group: Individual Therapy  [name] 06/21/2017, 1:20 PM

## 2017-06-22 ENCOUNTER — Inpatient Hospital Stay (HOSPITAL_COMMUNITY): Payer: Medicare Other | Admitting: Speech Pathology

## 2017-06-22 ENCOUNTER — Inpatient Hospital Stay (HOSPITAL_COMMUNITY): Payer: Medicare Other | Admitting: Occupational Therapy

## 2017-06-22 ENCOUNTER — Inpatient Hospital Stay (HOSPITAL_COMMUNITY): Payer: Medicare Other | Admitting: Physical Therapy

## 2017-06-22 ENCOUNTER — Encounter (HOSPITAL_COMMUNITY): Payer: Self-pay

## 2017-06-22 DIAGNOSIS — I6300 Cerebral infarction due to thrombosis of unspecified precerebral artery: Secondary | ICD-10-CM

## 2017-06-22 LAB — GLUCOSE, CAPILLARY
GLUCOSE-CAPILLARY: 195 mg/dL — AB (ref 65–99)
GLUCOSE-CAPILLARY: 224 mg/dL — AB (ref 65–99)
Glucose-Capillary: 138 mg/dL — ABNORMAL HIGH (ref 65–99)
Glucose-Capillary: 147 mg/dL — ABNORMAL HIGH (ref 65–99)
Glucose-Capillary: 207 mg/dL — ABNORMAL HIGH (ref 65–99)
Glucose-Capillary: 208 mg/dL — ABNORMAL HIGH (ref 65–99)
Glucose-Capillary: 244 mg/dL — ABNORMAL HIGH (ref 65–99)

## 2017-06-22 LAB — URINE CULTURE

## 2017-06-22 MED ORDER — VITAL 1.5 CAL PO LIQD
1000.0000 mL | ORAL | Status: DC
  Administered 2017-06-22 – 2017-06-23 (×2): 1000 mL
  Filled 2017-06-22 (×3): qty 1000

## 2017-06-22 NOTE — Progress Notes (Signed)
[place] PHYSICAL MEDICINE & REHABILITATION     PROGRESS NOTE  Subjective/Complaints:   Pt still with HA Right frontal, no other new c/os ROS: limited  Objective:  Vital Signs: Blood pressure (!) 120/59, pulse 69, temperature 97.7 F (36.5 C), temperature source Oral, resp. rate 18, height 5\' 4"  (1.626 m), weight 73 kg (160 lb 15 oz), SpO2 100 %. No results found.  Recent Labs  06/19/17 1232  WBC 11.4*  HGB 9.4*  HCT 30.7*  PLT 396    Recent Labs  06/19/17 1232  NA 135  K 4.2  CL 104  GLUCOSE 146*  BUN 30*  CREATININE 0.87  CALCIUM 8.7*   CBG (last 3)   Recent Labs  06/22/17 0027 06/22/17 0409 06/22/17 0755  GLUCAP 195* 147* 138*    Wt Readings from Last 3 Encounters:  06/22/17 73 kg (160 lb 15 oz)  06/18/17 76.4 kg (168 lb 6.9 oz)  01/15/16 79.3 kg (174 lb 12.8 oz)    Physical Exam:  BP (!) 120/59 (BP Location: Left Arm)   Pulse 69   Temp 97.7 F (36.5 C) (Oral)   Resp 18   Ht 5\' 4"  (1.626 m)   Wt 73 kg (160 lb 15 oz)   SpO2 100%   BMI 27.62 kg/m  Constitutional: She appears well-developed and well-nourished.  HENT: Normocephalic and atraumatic.  Eyes: EOM are normal. No discharge Neck: CFS #6 without drainage today Cardiovascular: RRR. No JVD Respiratory: normal effort. Fair flow, rhonchi heard GI: Soft. Bowel sounds are normal.  Musculoskeletal: She exhibits no edema or tenderness.  Neurological: Alert  Left facial droop with dysarthric speech.  Right sided weakness with decrease in motor control.  RUE grossly 3- to 3/5.Marland Kitchen  RLE grossly 4-/5.  Skin: Skin is warm and dry. She is not diaphoretic.  Psychiatric: affect flat, processing delayed Assessment/Plan: 1. 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 Physiatrist is providing close team supervision and 24 hour management of active  medical problems listed below. Physiatrist and rehab team continue to assess barriers to discharge/monitor patient progress toward functional and medical goals.  Function:  Bathing Bathing position   Position: Bed  Bathing parts Body parts bathed by patient: Chest, Abdomen, Right upper leg Body parts bathed by helper: Right arm, Left arm, Left upper leg, Right lower leg, Left lower leg, Back  Bathing assist Assist Level:  (MaxA )      Upper Body Dressing/Undressing Upper body dressing   What is the patient wearing?: Pull over shirt/dress       Pull over shirt/dress - Perfomed by helper: Thread/unthread right sleeve, Thread/unthread left sleeve, Put head through opening, Pull shirt over trunk        Upper body assist Assist Level:  (MaxA )   Set up : To obtain clothing/put away  Lower Body Dressing/Undressing Lower body dressing   What is the patient wearing?: Pants, Non-skid slipper socks       Pants- Performed by helper: Thread/unthread right pants leg, Thread/unthread left pants leg, Pull pants up/down   Non-skid slipper socks- Performed by helper: Don/doff right sock, Don/doff left sock                  Lower body assist Assist for lower body dressing:  (MaxA )      Toileting Toileting Toileting activity did not occur: No continent  bowel/bladder event        Toileting assist     Transfers Chair/bed transfer   Chair/bed transfer method: Squat pivot Chair/bed transfer assist level: Total assist (Pt < 25%) Chair/bed transfer assistive device: Armrests     Locomotion Ambulation Ambulation activity did not occur: Safety/medical concerns         Wheelchair          Cognition Comprehension Comprehension assist level: Understands basic 90% of the time/cues < 10% of the time  Expression Expression assist level: Expresses basic 50 - 74% of the time/requires cueing 25 - 49% of the time. Needs to repeat parts of sentences.  Social Interaction Social  Interaction assist level: Interacts appropriately 50 - 74% of the time - May be physically or verbally inappropriate.  Problem Solving Problem solving assist level: Solves basic 50 - 74% of the time/requires cueing 25 - 49% of the time  Memory Memory assist level: Recognizes or recalls 50 - 74% of the time/requires cueing 25 - 49% of the time    Medical Problem List and Plan:  1. 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 DVT Prophylaxis/Anticoagulation: Pharmaceutical: Lovenox  3. Headaches/Pain Management:   Topamax started 9/1--pt with persistent headaches   -increase to 50mg  and change to evening dosing 4. Mood: LCSW to follow for evaluation and support.  5. Neuropsych: This patient is not capable of making decisions on her own behalf.  6. Skin/Wound Care: Routine pressure relief measures.  7. Fluids/Electrolytes/Nutrition: Monitor I/Os  Elevated BUN 9/1 8. MSSA PNA: Completed treatment 7 day course of cefazolin on 8/30. Continue to monitor with strict aspiration precautions--HOB > 30 degrees at all times.  9. VDRF: Tolerating PMSV with supervision. Secretions felt to be in part due to GERD.   -continue #6 trach until secretion mgt/swallow better 10 HTN: Monitor BP bid--on lopressor bid and catapres  bp controlled 9/3 11. ABLA: Improved with transfusion.   Hemoglobin 9.4 on 9/1  Continue to monitor 12. Anxiety disorder:Team to provide ego support. Daughter staying to encourage patient. Palliative following for support and placed on IV ativan tid for symptoms management--decrease to prn due to sedation.  13. T2DM: Hgb A1C-  - CBG (last 3)   Recent Labs  06/22/17 0027 06/22/17 0409 06/22/17 0755  GLUCAP 195* 147* 138*  Fair control   -increase lantus to 20 u daily  -continue scheduled novolog 14. 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 Leucocytosis: persistent .Monitor for signs of infection.   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 B-MCA stenosis/L-ICA stenosis s/p revascularization: On ASA/Plavix.  19. Acute urinary retention:   Foley out  Ua+, ucx with multispecies- Resent  Empiric Macrobid started 9/1. Will stop and send urine for re-culture  LOS (Days) 4 A FACE TO FACE EVALUATION WAS PERFORMED  Aristeo Hankerson E 06/22/2017 8:25 AM

## 2017-06-22 NOTE — Progress Notes (Signed)
Occupational Therapy Session Note  Patient Details  Name: [name] MRN: [id number] Date of Birth: September 19, 1951  Today's Date: 06/22/2017 OT Individual Time: 1005-1020 OT Individual Time Calculation (min): 15 min  OT Missed Time: 45 min (nausea)   Short Term Goals: Week 1:  OT Short Term Goal 1 (Week 1): Pt will perform toilet transfer with max A. OT Short Term Goal 2 (Week 1): Pt will perform LB dressing with max A in order to decrease level of assistance with self care.  OT Short Term Goal 3 (Week 1): Pt will perform UB dressing with mod A and utilizing modifications as needed.   Skilled Therapeutic Interventions/Progress Updates:    Pt seen for OT session focusing on upright tolerance and bed mobility. Pt in supine upon arrival with daughter and friend present. PMSV donned, pt tolerated well and able to make basic needs known. Pt with complaints of nausea, however, with encouragement from family willing to attempt mobility. Family reports pt being pre-medicated prior to tx session with nausea medicine.  Pt transferred to EOB with max A, able to assist with brining LEs off EOB and total A to bring trunk uwards and shift hips with use of chuck pad. Pt sat EOB ~10 minutes, provided with emsis bag though it was never needed/ used. Pt required min A overall for static sitting balance (feet unable to reach floor due to height), intermittent sitting with close supervision. Pt then requested return to supine, unable to continue. Max A to return to supine and +2 to reposition up in bed. Pt left in supine with all needs in reach and family present. She missed 45 minutes of skilled tx time due to nausea, will attempt to make up as therapist and pt are able.   Therapist returned at 1300. Pt cont with complaints of nausea and reports emesis and diareha since therapist last saw pt.  Pt left in supine with all needs in reach to rest.   Therapy Documentation Precautions:   Precautions Precautions: Fall Precaution Comments: cortrak, trach Restrictions Weight Bearing Restrictions: No Pain:   No/ denies pain  See Function Navigator for Current Functional Status.   Therapy/Group: Individual Therapy  Lewis, Danel Requena C 06/22/2017, 7:11 AM

## 2017-06-22 NOTE — Progress Notes (Signed)
Nutrition Follow-up  DOCUMENTATION CODES:   Not applicable  INTERVENTION:  Increase Vital 1.5 formula via Cortrak NGT to new goal rate of 60 ml/hr x 20 hours (may hold TF for up to 4 hours for therapy) with 30 ml Prostat once daily to provide 1900 kcal, 96 grams of protein, and 912 ml of water.  Continue free water flushes 200 ml QID per tube. Total free water: 1712 ml/day.  NUTRITION DIAGNOSIS:   Inadequate oral intake related to inability to eat, dysphagia (Stroke) as evidenced by NPO status; ongoing  GOAL:   Patient will meet greater than or equal to 90% of their needs; progressing  MONITOR:   I & O's, Labs, Weight trends, TF tolerance, Diet advancement  REASON FOR ASSESSMENT:   Consult Enteral/tube feeding initiation and management  ASSESSMENT:   66yo female with history of DM, HTN, Peripheral neuropathy, HLD, pseudotumor cerebri, admitted on 8/13 with acute R hemispheric stroke s/p thrombectomy 8/16, trached 8/23 MBS done 8/30 showed dysphagia and silent aspiration, continues to be NPO. Presents to rehab with R hemiparesis, functional and cognitive deficits.  Pt currently has Vital 1.5 formula infusing at goal rate of 50 ml/hr x 20 hours with 30 ml Prostat once daily which is providing 1600 kcal, 83 grams of protein. RD to adjust tube feeds to better meet nutrition needs. Family at bedside reports pt has been tolerating her tube feeds. RD to continue to monitor.   Diet Order:  Diet NPO time specified  Skin:   (R groin incision)  Last BM:  9/3  Height:   Ht Readings from Last 1 Encounters:  06/18/17 5\' 4"  (1.626 m)    Weight:   Wt Readings from Last 1 Encounters:  06/22/17 160 lb 15 oz (73 kg)    Ideal Body Weight:  54.5 kg  BMI:  Body mass index is 27.62 kg/m.  Estimated Nutritional Needs:   Kcal:  1700-1900  Protein:  85-100 grams  Fluid:  1.7 - 1.9 L/day  EDUCATION NEEDS:   No education needs identified at this time  Corrin Parker, MS, RD,  LDN Pager # [phone] After hours/ weekend pager # [phone]

## 2017-06-22 NOTE — Progress Notes (Signed)
Physical Therapy Session Note  Patient Details  Name: [name] MRN: [id number] Date of Birth: Jan 23, 1951  Today's Date: 06/22/2017 PT Individual Time: 0800-0900 and 1455-1530  PT Individual Time Calculation (min): 60 min and 41min  Short Term Goals: Week 1:  PT Short Term Goal 1 (Week 1): Pt will transfer with max assist PT Short Term Goal 2 (Week 1): Pt will initiate gait training with PT PT Short Term Goal 3 (Week 1): Pt will tolerate out of bed in w/c x2 hours outside of therapy time.   Skilled Therapeutic Interventions/Progress Updates: Pt presented in bed with dgt present agreeable to therapy with encouragement. Pt noted to be incontinent of urine. Performed rolling L/R modA for peri-cleaning and donning clean brief. PTA donned brief total A for time management and pt able to perform bridge to facilitate pulling up shorts. Performed supine to sit maxA with cues for sequencing and assist for truncal support. Pt able to sit at EOB approx 10 min while MD performed assessment and nsg provided meds. Pt performed stand pivot transfer maxA x 2 with assist from nsg to w/c. Respiratory stopped by advising trial of RA. Pt transported to rehab gym on RA maintaining SpO2 >95%. Pt indicated increased nausea followed by period of emisis. Pt returned to room and requesting to return to bed due to feeling hypoglycemic. Pt performed squat pivot total assist and returned to supine modA. Pt repositioned for comfort and left with call bell within reach and pt present.   Tx2: Pt presented in bed agreeable with encouragement. Performed supine to sit maxA via log roll technique with cues for hand placement and sequencing, pt able to scoot to EOB minA . Pt sat at EOB x 5 min while PTA provided pt edu on benefits for OOB activity. Pt agreeable to attempt sit to stand. Initally trialed with RW with pt demonstrating poor hand placement and unable to clear buttock. Second attempt total assist from PTA with pt able  to achieve full standing with manual facilitation of anterior hips. Pt able to tolerate standing approx 30sec before requesting to sit. Pt returned to sitting and performed sit to supine modA x 1. Pt repositioned for comfort and left with call bell within reach and family present.      Therapy Documentation Precautions:  Precautions Precautions: Fall Precaution Comments: cortrak, trach Restrictions Weight Bearing Restrictions: No General:   Vital Signs: Therapy Vitals Temp: 97.7 F (36.5 C) Temp Source: Oral Pulse Rate: 69 Resp: 18 BP: (!) 120/59 Patient Position (if appropriate): Lying Oxygen Therapy SpO2: 100 % O2 Device: Tracheostomy Collar O2 Flow Rate (L/min): 5 L/min FiO2 (%): 28 %  See Function Navigator for Current Functional Status.   Therapy/Group: Individual Therapy  Lanetra Hartley  Nirav Sweda, PTA  06/22/2017, 7:47 AM

## 2017-06-22 NOTE — Progress Notes (Signed)
Patient information reviewed and entered into eRehab system by Lejend Dalby, RN, CRRN, PPS Coordinator.  Information including medical coding and functional independence measure will be reviewed and updated through discharge.     Per nursing patient was given "Data Collection Information Summary for Patients in Inpatient Rehabilitation Facilities with attached "Privacy Act Statement-Health Care Records" upon admission.  

## 2017-06-22 NOTE — Progress Notes (Signed)
Speech Language Pathology Daily Session Note  Patient Details  Name: SYRIANNA SCHILLACI MRN: [id number] Date of Birth: 28-Jun-1951  Today's Date: 06/22/2017 SLP Individual Time: 2620-3559 SLP Individual Time Calculation (min): 55 min  Short Term Goals: Week 1: SLP Short Term Goal 1 (Week 1): Pt will tolerate PMSV without decline in vitals during all waking hours.  SLP Short Term Goal 2 (Week 1): Pt will consume trials of honey thick via spoon and puree with minimal overt s/s of aspiration to demonstrate readiness for repeat instrumental swallow study. SLP Short Term Goal 3 (Week 1): Pt will increase vocal intensity with Mod A cues to achieve ~ 90% intelligiblity at the phrase level.  SLP Short Term Goal 4 (Week 1): Pt will increase sustained attention to task for ~ 20 minutes with Mod A cues.  SLP Short Term Goal 5 (Week 1): Pt will demonstrate basic problem solving for familiar tasks with Mod A cues.   Skilled Therapeutic Interventions: Skilled treatment session focused on speech and dysphagia goals. Upon arrival, patient was awake with PMSV in place. Patient tolerated PMSV throughout the session with all vitals remaining WFL and without signs of breath stacking. However, patient required overall Mod A verbal cues to utilize an increased vocal intensity to achieve ~90% intelligibility at the phrase and sentence level. Patient consumed minimal amounts (but more than previous session) of PO trials despite Max encouragement from clinician and family. Patient consumed trials of honey-thick liquids via tsp without overt s/s of aspiration and Min A verbal cues for use of a chin tuck. Recommend continued trials with SLP only. Patient with reports of dizziness throughout session and required frequent rest breaks. RN aware. Patient left upright in bed with PMSV in place and family present. Continue with current plan of care.      Function:  Eating Eating   Modified Consistency Diet: Yes Eating Assist  Level: Helper feeds patient           Cognition Comprehension Comprehension assist level: Understands basic 50 - 74% of the time/ requires cueing 25 - 49% of the time  Expression Expression assistive device: Talk trach valve Expression assist level: Expresses basic 50 - 74% of the time/requires cueing 25 - 49% of the time. Needs to repeat parts of sentences.  Social Interaction Social Interaction assist level: Interacts appropriately 25 - 49% of time - Needs frequent redirection.  Problem Solving Problem solving assist level: Solves basic 25 - 49% of the time - needs direction more than half the time to initiate, plan or complete simple activities  Memory Memory assist level: Recognizes or recalls 25 - 49% of the time/requires cueing 50 - 75% of the time    Pain No/Denies Pain   Therapy/Group: Individual Therapy  Chadwick Reiswig 06/22/2017, 3:05 PM

## 2017-06-22 NOTE — Progress Notes (Signed)
Recreational Therapy Session Note  Patient Details  Name: [name] MRN: [id number] Date of Birth: [birth year] Today's Date: 06/22/2017  Pt with low activity tolerance andTR eval deferred at this time. Will continue to monitor through team for future participation.  [place] 06/22/2017, 1:44 PM

## 2017-06-22 NOTE — Plan of Care (Signed)
Problem: RH BOWEL ELIMINATION Goal: RH STG MANAGE BOWEL WITH ASSISTANCE STG Manage Bowel with min Assistance.  Outcome: Not Progressing incontinent Goal: RH STG MANAGE BOWEL W/MEDICATION W/ASSISTANCE STG Manage Bowel with Medication with min Assistance.  Outcome: Not Progressing Given Fleet enema and had a large incontinent bowel movent  Problem: RH BLADDER ELIMINATION Goal: RH STG MANAGE BLADDER WITH EQUIPMENT WITH ASSISTANCE STG Manage Bladder With Equipment With mod Assistance  Outcome: Not Progressing Incontinent of bladder, requires  In and Out catheterization from helper  Problem: RH SKIN INTEGRITY Goal: RH STG SKIN FREE OF INFECTION/BREAKDOWN Outcome: Not Progressing Stage II noted to buttocks, skin care performed, no skin infection noted  Problem: RH SAFETY Goal: RH STG ADHERE TO SAFETY PRECAUTIONS W/ASSISTANCE/DEVICE STG Adhere to Safety Precautions With min Assistance/Device.  Outcome: Progressing No safety issues noted Goal: RH STG DECREASED RISK OF FALL WITH ASSISTANCE STG Decreased Risk of Fall With Assistance.  Outcome: Progressing No fall or injury this shift

## 2017-06-23 ENCOUNTER — Inpatient Hospital Stay (HOSPITAL_COMMUNITY): Payer: Medicare Other

## 2017-06-23 ENCOUNTER — Inpatient Hospital Stay (HOSPITAL_COMMUNITY): Payer: Medicare Other | Admitting: Speech Pathology

## 2017-06-23 ENCOUNTER — Inpatient Hospital Stay (HOSPITAL_COMMUNITY): Payer: Medicare Other | Admitting: Occupational Therapy

## 2017-06-23 ENCOUNTER — Inpatient Hospital Stay (HOSPITAL_COMMUNITY): Payer: Medicare Other | Admitting: Physical Therapy

## 2017-06-23 DIAGNOSIS — H53461 Homonymous bilateral field defects, right side: Secondary | ICD-10-CM

## 2017-06-23 DIAGNOSIS — R042 Hemoptysis: Secondary | ICD-10-CM | POA: Insufficient documentation

## 2017-06-23 DIAGNOSIS — K920 Hematemesis: Secondary | ICD-10-CM

## 2017-06-23 DIAGNOSIS — I639 Cerebral infarction, unspecified: Secondary | ICD-10-CM

## 2017-06-23 DIAGNOSIS — R112 Nausea with vomiting, unspecified: Secondary | ICD-10-CM | POA: Insufficient documentation

## 2017-06-23 LAB — COMPREHENSIVE METABOLIC PANEL
ALK PHOS: 129 U/L — AB (ref 38–126)
ALT: 30 U/L (ref 14–54)
AST: 38 U/L (ref 15–41)
Albumin: 3 g/dL — ABNORMAL LOW (ref 3.5–5.0)
Anion gap: 9 (ref 5–15)
BILIRUBIN TOTAL: 0.5 mg/dL (ref 0.3–1.2)
BUN: 30 mg/dL — ABNORMAL HIGH (ref 6–20)
CALCIUM: 9.1 mg/dL (ref 8.9–10.3)
CO2: 23 mmol/L (ref 22–32)
CREATININE: 0.82 mg/dL (ref 0.44–1.00)
Chloride: 103 mmol/L (ref 101–111)
GFR calc non Af Amer: >60 mL/min (ref >60)
Glucose, Bld: 191 mg/dL — ABNORMAL HIGH (ref 65–99)
Potassium: 4.1 mmol/L (ref 3.5–5.1)
SODIUM: 135 mmol/L (ref 135–145)
Total Protein: 7.1 g/dL (ref 6.5–8.1)

## 2017-06-23 LAB — CBC
HEMATOCRIT: 33.1 % — AB (ref 36.0–46.0)
HEMOGLOBIN: 10.5 g/dL — AB (ref 12.0–15.0)
MCH: 27.9 pg (ref 26.0–34.0)
MCHC: 31.7 g/dL (ref 30.0–36.0)
MCV: 88 fL (ref 78.0–100.0)
Platelets: 434 10*3/uL — ABNORMAL HIGH (ref 150–400)
RBC: 3.76 MIL/uL — ABNORMAL LOW (ref 3.87–5.11)
RDW: 14.7 % (ref 11.5–15.5)
WBC: 14.5 10*3/uL — ABNORMAL HIGH (ref 4.0–10.5)

## 2017-06-23 LAB — CBC WITH DIFFERENTIAL/PLATELET
Basophils Absolute: 0 10*3/uL (ref 0.0–0.1)
Basophils Relative: 0 %
EOS ABS: 0.2 10*3/uL (ref 0.0–0.7)
Eosinophils Relative: 1 %
HEMATOCRIT: 31.3 % — AB (ref 36.0–46.0)
HEMOGLOBIN: 9.9 g/dL — AB (ref 12.0–15.0)
LYMPHS ABS: 1.2 10*3/uL (ref 0.7–4.0)
LYMPHS PCT: 11 %
MCH: 27.7 pg (ref 26.0–34.0)
MCHC: 31.6 g/dL (ref 30.0–36.0)
MCV: 87.7 fL (ref 78.0–100.0)
Monocytes Absolute: 0.6 10*3/uL (ref 0.1–1.0)
Monocytes Relative: 6 %
NEUTROS ABS: 8.6 10*3/uL — AB (ref 1.7–7.7)
NEUTROS PCT: 82 %
Platelets: 419 10*3/uL — ABNORMAL HIGH (ref 150–400)
RBC: 3.57 MIL/uL — AB (ref 3.87–5.11)
RDW: 14.7 % (ref 11.5–15.5)
WBC: 10.5 10*3/uL (ref 4.0–10.5)

## 2017-06-23 LAB — GLUCOSE, CAPILLARY
GLUCOSE-CAPILLARY: 170 mg/dL — AB (ref 65–99)
GLUCOSE-CAPILLARY: 198 mg/dL — AB (ref 65–99)
GLUCOSE-CAPILLARY: 198 mg/dL — AB (ref 65–99)
Glucose-Capillary: 161 mg/dL — ABNORMAL HIGH (ref 65–99)
Glucose-Capillary: 144 mg/dL — ABNORMAL HIGH (ref 65–99)

## 2017-06-23 MED ORDER — DEXTROSE-NACL 5-0.45 % IV SOLN
INTRAVENOUS | Status: DC
  Administered 2017-06-23: 13:00:00 via INTRAVENOUS

## 2017-06-23 MED ORDER — PANTOPRAZOLE SODIUM 40 MG IV SOLR
40.0000 mg | Freq: Two times a day (BID) | INTRAVENOUS | Status: DC
  Administered 2017-06-23 – 2017-06-27 (×8): 40 mg via INTRAVENOUS
  Filled 2017-06-23 (×9): qty 40

## 2017-06-23 MED ORDER — DIAZEPAM 1 MG/ML PO SOLN: 1.0000 mg | Freq: Three times a day (TID) | ORAL | Status: DC | PRN

## 2017-06-23 MED ORDER — IOPAMIDOL (ISOVUE-370) INJECTION 76%
INTRAVENOUS | Status: AC
  Administered 2017-06-23: 50 mL
  Filled 2017-06-23: qty 50

## 2017-06-23 MED ORDER — VITAL 1.5 CAL PO LIQD
1000.0000 mL | ORAL | Status: DC
  Filled 2017-06-23 (×2): qty 1000

## 2017-06-23 MED ORDER — SODIUM CHLORIDE 0.9% FLUSH
10.0000 mL | INTRAVENOUS | Status: DC | PRN
  Administered 2017-06-26: 30 mL
  Administered 2017-06-27 – 2017-07-01 (×3): 20 mL
  Filled 2017-06-23 (×4): qty 40

## 2017-06-23 MED ORDER — PRO-STAT SUGAR FREE PO LIQD
30.0000 mL | Freq: Every day | ORAL | Status: DC
  Administered 2017-06-24 – 2017-06-27 (×3): 30 mL
  Filled 2017-06-23 (×6): qty 30

## 2017-06-23 MED ORDER — FREE WATER
200.0000 mL | Freq: Three times a day (TID) | Status: DC
  Administered 2017-06-24 (×3): 200 mL

## 2017-06-23 MED ORDER — SUCRALFATE 1 GM/10ML PO SUSP
1.0000 g | Freq: Three times a day (TID) | ORAL | Status: DC
  Administered 2017-06-23 – 2017-06-27 (×12): 1 g via ORAL
  Filled 2017-06-23 (×15): qty 10

## 2017-06-23 MED ORDER — ONDANSETRON HCL 4 MG/2ML IJ SOLN
4.0000 mg | Freq: Four times a day (QID) | INTRAMUSCULAR | Status: DC
  Administered 2017-06-23 – 2017-06-27 (×6): 4 mg via INTRAVENOUS
  Filled 2017-06-23 (×10): qty 2

## 2017-06-23 NOTE — Progress Notes (Signed)
Medical Consultation   [name]  ERX:[id number]  DOB: [birth year]  DOA: 06/18/2017  PCP: [name], MD   Outpatient Specialists:   podiatry, neurology, cardiology, ortho   Requesting physician: Jeannene Patella - PA PMR  Reason for consultation: N/V and either Hematemesis or Hemoptysis  History of Present Illness: [name] is an 66 y.o. 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 Patient was admitted to [facility] on 05/31/2017 after sustaining a left MCA stroke. Patient was subsequently admitted to physical medicine and rehabilitation on 06/18/2017. Of note patient underwent angiogram on 06/03/2017 showing less than 50% left ICA stenosis. However, during procedure. Patient became unresponsive with apparent seizure. She was taken to IR for endovascular revascularization of bilateral MCA occlusions and TIBC I reperfusion bilaterally. Patient was intubated with difficult extubation course eventually requiring tracheostomy which was placed on 06/10/2017. Patient was finally successfully extubated on 06/16/2017 and was transferred to see IR on a 30 2018. Other significant events include treatment for MSSA bronchitis versus pneumonia on 06/09/2017, blood transfusion due to her hemoglobin dropped to 7.6 on 06/16/2017.  Since going to rehabilitation patient has done fairly well. However the last 24 hours patient has developed worsening nausea and vomiting with questionable episodes of aspiration of gastric contents. Of note patient continues to be nothing by mouth and receives feeds through an NG tube. Additional concerns for potential hematemesis were noted by rehabilitation staff. During routine trach cleaning and respiratory suction Frank blood was noted. This increases the level of concern for aspiration of gastric  contents versus hemoptysis. At time of my evaluation patient is without complaint and states that her respiratory status is improved overall. No other reported seizure type activities, fevers, abdominal pain. No further history was able to be obtained at this time due to difficulty with keeping indication with patient due to overall level of illness and trach.  Review of Systems:  ROS As per HPI otherwise all other systems reviewed and are negative   Past Medical History: Past Medical History:  Diagnosis Date  . Anemia    takes iron supplement  . Anesthesia complication    disorientation due to pseudotumor  . Diabetes mellitus    IDDM  . Family history of anesthesia complication 66 yrs ago   brother stopped breathing for a minute or two  . GERD (gastroesophageal reflux disease)   . Headache(784.0)    due to pseudotumor; daily headache  . Hypercholesteremia    unable to tolerate statins  . Hypertension    under control with meds., has been on med. x 5 yr.  . Peripheral neuropathy   . PONV (postoperative nausea and vomiting)   . 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 Seizures ([place])    due to shunt failure; last seizure 2007  . Synovitis of ankle 04/2013   left  . Wears dentures    full    Past Surgical History: Past Surgical History:  Procedure Laterality Date  . ANKLE ARTHROSCOPY Left 05/18/2013   Procedure: LEFT ANKLE ARTHROSCOPY WITH DEBRIDEMENT,  SUBTALAR OPEN DEBRIDEMENT ;  Surgeon: Wylene Simmer, MD;  Location: [place];  Service: Orthopedics;  Laterality: Left;  . APPENDECTOMY     as a child  . BALLOON DILATION N/A 07/04/2013   Procedure: BALLOON  DILATION;  Surgeon: Garlan Fair, MD;  Location: [facility];  Service: Endoscopy;  Laterality: N/A;  . BLADDER SUSPENSION    . BREAST LUMPECTOMY W/ NEEDLE LOCALIZATION Left 05/22/2011  . CAROTID ANGIOGRAPHY Bilateral 06/03/2017   Procedure: Bilateral Carotid Angiography;  Surgeon: Conrad Eddystone, MD;  Location: [place] [facility];  Service: Cardiovascular;  Laterality: Bilateral;  . COLONOSCOPY WITH PROPOFOL N/A 05/14/2014   Procedure: COLONOSCOPY WITH PROPOFOL;  Surgeon: Garlan Fair, MD;  Location: [facility];  Service: Endoscopy;  Laterality: N/A;  . ESOPHAGEAL DILATION  06/23/2006; 08/05/2004  . ESOPHAGOGASTRODUODENOSCOPY N/A 07/04/2013   Procedure: ESOPHAGOGASTRODUODENOSCOPY (EGD);  Surgeon: Garlan Fair, MD;  Location: [facility];  Service: Endoscopy;  Laterality: N/A;  . ESOPHAGOGASTRODUODENOSCOPY (EGD) WITH PROPOFOL N/A 05/14/2014   Procedure: ESOPHAGOGASTRODUODENOSCOPY (EGD) WITH PROPOFOL;  Surgeon: Garlan Fair, MD;  Location: [facility];  Service: Endoscopy;  Laterality: N/A;  . IR ANGIO VERTEBRAL SEL SUBCLAVIAN INNOMINATE UNI R MOD SED  06/03/2017  . IR PERCUTANEOUS ART THROMBECTOMY/INFUSION INTRACRANIAL INC DIAG ANGIO  06/03/2017  . IR PERCUTANEOUS ART THROMBECTOMY/INFUSION INTRACRANIAL INC DIAG ANGIO  06/03/2017  . LUMBAR PERITONEAL SHUNT     x 2  . OVARIAN CYST REMOVAL     age 46  . RADIOLOGY WITH ANESTHESIA N/A 06/03/2017   Procedure: RADIOLOGY WITH ANESTHESIA;  Surgeon: Luanne Bras, MD;  Location: [place];  Service: Radiology;  Laterality: N/A;  . SHUNT REVISION  2007  . TOTAL ABDOMINAL HYSTERECTOMY W/ BILATERAL SALPINGOOPHORECTOMY  1994   age 35s  . WRIST SURGERY Left 2014    [name]     Allergies:   Allergies  Allergen Reactions  . Carbamazepine Hives, Shortness Of Breath and Other (See Comments)  . Penicillins Anaphylaxis and Other (See Comments)    Mother, father and brother have history of anaphylaxis reaction to penicillin so pt does not take   . Statins Other (See Comments)    Severe muscle weakness, leg numbness, severe headaches, chest pain   . Tricyclic Antidepressants Other (See Comments)    IMPAIRED MEMORY  . Atorvastatin Other (See Comments)    Severe muscle weakness, leg numbness, severe headaches, chest pain   .  Ezetimibe Other (See Comments)    Severe muscle weakness, leg numbness, severe headaches, chest pain   . Nortriptyline Other (See Comments)    IMPAIRED MEMORY  . Quinapril Hcl Other (See Comments)    Unknown reaction  . Rifampin Diarrhea  . Metoclopramide Nausea And Vomiting and Rash     Social History:  reports that she has never smoked. She has never used smokeless tobacco. She reports that she does not drink alcohol or use drugs.   Family History: Family History  Problem Relation Age of Onset  . Heart disease Father   . Migraines Neg Hx      Physical Exam: Vitals:   06/23/17 0300 06/23/17 0414 06/23/17 0835 06/23/17 1200  BP:  (!) 151/68  (!) 141/62  Pulse: 68 80 78 88  Resp: 16 18 16 20   Temp:  (!) 97.5 F (36.4 C)    TempSrc:  Oral    SpO2: 99% 100% 100% 98%  Weight:  74.5 kg (164 lb 3.9 oz)    Height:        General: Comfortable, resting in bed peacefully Eyes:  PERRL, EOMI, normal lids, iris ENT: Dry mucous membranes, normal hearing Neck: Trach collar in place, no masses Cardiovascular: Difficult to appreciate cardiac sounds due to supplemental oxygen being  provided through trach. Regular rhythm. 2/6 systolic murmur. Trace bilateral lower extremity edema. Respiratory: Crackles bilaterally. Trach dependent with supplemental O2. Normal effort. Bloody secretions on routine suctioning and cleaning of trach components and airway Abdomen:  soft, ntnd, NABS Skin:  no rash or induration seen on limited exam Musculoskeletal:  Unable to fully assess due to patient's lack of motivation to participate at time of my exam. Psychiatric: Pleasant. Attempts to answer questions appropriately though this is difficult for her given her physical condition. Alert and oriented. Neurologic: Limited as stated above due to patient's lack of participation. Extremity sensation intact.  Data reviewed:  I have personally reviewed following labs and imaging studies Labs:  CBC:  Recent  Labs Lab 06/17/17 1221 06/18/17 0459 06/19/17 1232 06/23/17 0619  WBC 11.5* 11.3* 11.4* 14.5*  NEUTROABS  --   --  7.8*  --   HGB 9.4* 9.4* 9.4* 10.5*  HCT 30.2* 30.3* 30.7* 33.1*  MCV 87.3 89.4 89.0 88.0  PLT 401* 390 396 434*    Basic Metabolic Panel:  Recent Labs Lab 06/17/17 1221 06/18/17 0459 06/19/17 1232  NA 141 140 135  K 4.0 4.0 4.2  CL 106 106 104  CO2 28 27 23   GLUCOSE 75 155* 146*  BUN 18 17 30*  CREATININE 0.65 0.77 0.87  CALCIUM 8.5* 8.7* 8.7*   GFR Estimated Creatinine Clearance: 63.7 mL/min (by C-G formula based on SCr of 0.87 mg/dL). Liver Function Tests:  Recent Labs Lab 06/19/17 1232  AST 106*  ALT 29  ALKPHOS 104  BILITOT 0.3  PROT 6.0*  ALBUMIN 2.6*   No results for input(s): LIPASE, AMYLASE in the last 168 hours. No results for input(s): AMMONIA in the last 168 hours. Coagulation profile No results for input(s): INR, PROTIME in the last 168 hours.  Cardiac Enzymes: No results for input(s): CKTOTAL, CKMB, CKMBINDEX, TROPONINI in the last 168 hours. BNP: Invalid input(s): POCBNP CBG:  Recent Labs Lab 06/22/17 1214 06/22/17 1613 06/22/17 2029 06/23/17 0004 06/23/17 0409  GLUCAP 224* 244* 207* 208* 198*   D-Dimer No results for input(s): DDIMER in the last 72 hours. Hgb A1c No results for input(s): HGBA1C in the last 72 hours. Lipid Profile No results for input(s): CHOL, HDL, LDLCALC, TRIG, CHOLHDL, LDLDIRECT in the last 72 hours. Thyroid function studies No results for input(s): TSH, T4TOTAL, T3FREE, THYROIDAB in the last 72 hours.  Invalid input(s): FREET3 Anemia work up No results for input(s): VITAMINB12, FOLATE, FERRITIN, TIBC, IRON, RETICCTPCT in the last 72 hours. Urinalysis    Component Value Date/Time   COLORURINE YELLOW 06/20/2017 0642   APPEARANCEUR HAZY (A) 06/20/2017 0642   LABSPEC 1.010 06/20/2017 0642   PHURINE 6.0 06/20/2017 0642   GLUCOSEU 50 (A) 06/20/2017 0642   HGBUR LARGE (A) 06/20/2017 0642    BILIRUBINUR NEGATIVE 06/20/2017 0642   KETONESUR NEGATIVE 06/20/2017 0642   PROTEINUR NEGATIVE 06/20/2017 0642   NITRITE POSITIVE (A) 06/20/2017 0642   LEUKOCYTESUR LARGE (A) 06/20/2017 0642     Microbiology Recent Results (from the past 240 hour(s))  Urine culture     Status: Abnormal   Collection Time: 06/20/17  6:43 AM  Result Value Ref Range Status   Specimen Description URINE, CATHETERIZED  Final   Special Requests NONE  Final   Culture MULTIPLE SPECIES PRESENT, SUGGEST RECOLLECTION (A)  Final   Report Status 06/21/2017 FINAL  Final  Culture, Urine     Status: Abnormal   Collection Time: 06/21/17  5:37 PM  Result Value  Ref Range Status   Specimen Description URINE, RANDOM  Final   Special Requests NONE  Final   Culture MULTIPLE SPECIES PRESENT, SUGGEST RECOLLECTION (A)  Final   Report Status 06/22/2017 FINAL  Final       Inpatient Medications:   Scheduled Meds: . acetaZOLAMIDE  250 mg Per Tube TID  . aspirin  325 mg Per Tube Daily  . chlorhexidine gluconate (MEDLINE KIT)  15 mL Mouth Rinse BID  . cloNIDine  0.3 mg Transdermal Weekly  . clopidogrel  75 mg Per Tube Q breakfast  . docusate  100 mg Per Tube Daily  . Investigational - Study Medication  20 mg Per Tube TID  . enoxaparin (LOVENOX) injection  40 mg Subcutaneous Q24H  . estropipate  0.75 mg Per Tube Daily  . famotidine  20 mg Per Tube Daily  . feeding supplement (PRO-STAT SUGAR FREE 64)  30 mL Per Tube Daily  . free water  200 mL Per Tube TID WC & HS  . insulin aspart  0-15 Units Subcutaneous Q4H  . insulin aspart  4 Units Subcutaneous Q4H  . insulin glargine  20 Units Subcutaneous Daily  . mouth rinse  15 mL Mouth Rinse QID  . metoprolol tartrate  100 mg Per Tube BID  . nitrofurantoin (macrocrystal-monohydrate)  100 mg Oral Q12H  . ondansetron (ZOFRAN) IV  4 mg Intravenous Q6H  . pantoprazole (PROTONIX) IV  40 mg Intravenous Q12H  . sucralfate  1 g Oral TID WC & HS  . topiramate  50 mg Oral Daily    Continuous Infusions: . dextrose 5 % and 0.45% NaCl 50 mL/hr at 06/23/17 1239  . feeding supplement (VITAL 1.5 CAL) 1,000 mL (06/23/17 0036)     Radiological Exams on Admission: Ct Head Wo Contrast  Result Date: 06/23/2017 CLINICAL DATA:  Right frontal headache with nausea and vomiting EXAM: CT HEAD WITHOUT CONTRAST TECHNIQUE: Contiguous axial images were obtained from the base of the skull through the vertex without intravenous contrast. COMPARISON:  Head CT June 03, 2017 and brain MRI June 04, 2017 FINDINGS: Brain: There is slight diffuse atrophy. There is no demonstrable intracranial mass, hemorrhage, extra-axial fluid collection, or midline shift. There is patchy small vessel disease throughout the centra semiovale bilaterally. No focal vascular distribution type infarct is evident. The previously noted multiple small infarcts on diffusion imaging are not delineated on this noncontrast enhanced CT examination. No acute infarct is demonstrated on this study currently. Vascular: There is increased attenuation in the M2 segment of the left middle cerebral artery, a finding that may represent early hyperdense vessel. No similar findings elsewhere. There is calcification in each carotid siphon. There is also calcification in the distal left vertebral artery. Skull: Bony calvarium appears intact. Sinuses/Orbits: There is opacification throughout much of the inferior left maxillary antrum with what appear to be retention cysts in this area. There is mucosal thickening in several ethmoid air cells. There are apparent retention cysts in the posterior right sphenoid sinus. Other paranasal sinuses appear clear. Orbits appear symmetric bilaterally. Other: Mastoid air cells are clear. IMPRESSION: 1. Question early hyperdense vessel in the periphery of the left middle cerebral artery. Given concern for hyperdense vessel in this area and potential for acute infarct, correlation with brain MRI including  diffusion imaging is warranted at this time. 2. Mild atrophy with patchy supratentorial small vessel disease. No well-defined acute infarct evident. The multiple small infarcts noted on recent MR are not well delineated on noncontrast enhanced CT.  3.  No foci of hemorrhage.  No extra-axial fluid collection. 4.  Foci of arterial vascular calcification noted. 5.  Areas of paranasal sinus disease. These results were called by telephone at the time of interpretation on 06/23/2017 at 11:29 am to Reesa Chew, [place] ,who verbally acknowledged these results. Electronically Signed   By: Lowella Grip III M.D.   On: 06/23/2017 11:29   Dg Abd 2 Views  Result Date: 06/23/2017 CLINICAL DATA:  Abdominal distention with vomiting EXAM: ABDOMEN - 2 VIEW COMPARISON:  June 14, 2017 FINDINGS: Supine and upright images were obtained. Feeding tube tip is at the level of the fourth portion of the duodenum. There is contrast in the colon. There is no bowel dilatation or air-fluid level to suggest bowel obstruction. No evident free air. Visualized lung bases are clear. IMPRESSION: No demonstrable bowel obstruction or free air. Feeding tube tip in fourth portion of duodenum. Electronically Signed   By: Lowella Grip III M.D.   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  - Management per Neuro and CIR - MRI pending  Intractable N/V: likely secondary to new stroke vs viral gastro vs DM gastroparesis - Continue Zofran - Add phenergan (Allergy -  rash, to [place]) - Hold tube feeds for 24 hrs  Potential Aspiration: concern for aspiration based on exam findings and reports of witnessed aspiration with hematemesis. Crackles on exam. Pts trach collar and upper airway w/ fresh blood at time of my exam (raises question of hemoptysis though less likely) - CXR - Hold tube feeds for 24 hrs.  - Agree w/ initiation of IVF at 50cc/hr.  - +/- Pulm consult if condition worsens   Hematemesis: likely from esophageal or gastric irritation. Hgb stable. Doubt etiology such as Mallory-Weiss tear.  - Continue pepcid - Start Carafate, and protonix - GI consult recommended but will defer to CIR - CBC Q12 x2  Thank you for this consultation.  Our [facility] hospitalist team will follow the patient with you.    Daimien Patmon J M.D. [facility] 06/23/2017, 12:57 PM

## 2017-06-23 NOTE — Progress Notes (Signed)
Arrived to patient room to do 1200 trach check.  Patient needed to be suctioned.  Patient had been given a dose of ativan due to attempting to go for MRI.  Suctioned patient and obtained a moderate amount of thick, bright red bloody secretions.  MD was present in room while suctioning and is aware of the bloody secretions.  Stated that xray was ordered and consult to GI had been started.  Sats still currently maintaining at 99-100%.  Will continue to monitor.

## 2017-06-23 NOTE — Consult Note (Signed)
[facility] Consult  Referring Provider: Waldemar Dickens, MD Primary Care Physician:  [name], MD Primary Gastroenterologist: Earle Gell, MD  Reason for Consultation: Nausea and vomiting? Vomiting blood versus coughing blood.  HPI: [name] is a 66 y.o. Caucasian female admitted at physical medicine and rehabilitation or decline in mobility secondary to stroke with right-sided weakness, dysphagia and cognitive to deficits. Patient had presented to the ER on 05/31/17 with numbness of right upper extremity, and was found to have an evolving stroke during carotid Doppler examination. Her clinical course in the hospital was followed by ICU admission, ventilator assistance, eventual tracheostomy and feeding via NG tube placement. She will be evaluated for feeding tube placement next week.  GI was consulted, as yesterday evening patient had nausea and her daughter noticed bright red blood and mucus by mouth, she is unsure if she coughed blood or vomited blood. Patient went for a CAT scan today morning and apparently had coffee-ground material and mucoid sputum by mouth? again unsure if she vomited it or coughed it up.  Notes from pulmonology noted- patient had moderate amount of thick and bright red bloody secretions on suctioning.  As per her nurse at bedside patient's last bowel movement was 2 days ago and there was no black stool or bloody bowel movement noted. Patient has had endoscopy and colonoscopy in the last several years with unknown findings and unknown indication.   Past Medical History:  Diagnosis Date  . Anemia    takes iron supplement  . Anesthesia complication    disorientation due to pseudotumor  . Diabetes mellitus    IDDM  . Family history of anesthesia complication 7 yrs ago   brother stopped breathing for a minute or two  . GERD (gastroesophageal reflux disease)   . Headache(784.0)    due to pseudotumor; daily headache  .  Hypercholesteremia    unable to tolerate statins  . Hypertension    under control with meds., has been on med. x 5 yr.  . Peripheral neuropathy   . PONV (postoperative nausea and vomiting)   . 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 Synovitis of ankle 04/2013   left  . Wears dentures    full    Past Surgical History:  Procedure Laterality Date  . ANKLE ARTHROSCOPY Left 05/18/2013   Procedure: LEFT ANKLE ARTHROSCOPY WITH DEBRIDEMENT,  SUBTALAR OPEN DEBRIDEMENT ;  Surgeon: Wylene Simmer, MD;  Location: [facility];  Service: Orthopedics;  Laterality: Left;  . APPENDECTOMY     as a child  . BALLOON DILATION N/A 07/04/2013   Procedure: BALLOON DILATION;  Surgeon: Garlan Fair, MD;  Location: [facility];  Service: Endoscopy;  Laterality: N/A;  . BLADDER SUSPENSION    . BREAST LUMPECTOMY W/ NEEDLE LOCALIZATION Left 05/22/2011  . CAROTID ANGIOGRAPHY Bilateral 06/03/2017   Procedure: Bilateral Carotid Angiography;  Surgeon: Conrad Marcus, MD;  Location: [facility];  Service: Cardiovascular;  Laterality: Bilateral;  . COLONOSCOPY WITH PROPOFOL N/A 05/14/2014   Procedure: COLONOSCOPY WITH PROPOFOL;  Surgeon: Garlan Fair, MD;  Location: [facility];  Service: Endoscopy;  Laterality: N/A;  . ESOPHAGEAL DILATION  06/23/2006; 08/05/2004  . ESOPHAGOGASTRODUODENOSCOPY N/A 07/04/2013   Procedure: ESOPHAGOGASTRODUODENOSCOPY (EGD);  Surgeon: Garlan Fair, MD;  Location: [facility];  Service: Endoscopy;  Laterality: N/A;  . ESOPHAGOGASTRODUODENOSCOPY (EGD) WITH PROPOFOL N/A 05/14/2014  Procedure: ESOPHAGOGASTRODUODENOSCOPY (EGD) WITH PROPOFOL;  Surgeon: Garlan Fair, MD;  Location: [facility];  Service: Endoscopy;  Laterality: N/A;  . IR ANGIO VERTEBRAL SEL SUBCLAVIAN INNOMINATE UNI R MOD SED  06/03/2017  . IR PERCUTANEOUS ART THROMBECTOMY/INFUSION INTRACRANIAL INC DIAG ANGIO  06/03/2017  . IR  PERCUTANEOUS ART THROMBECTOMY/INFUSION INTRACRANIAL INC DIAG ANGIO  06/03/2017  . LUMBAR PERITONEAL SHUNT     x 2  . OVARIAN CYST REMOVAL     age 74  . RADIOLOGY WITH ANESTHESIA N/A 06/03/2017   Procedure: RADIOLOGY WITH ANESTHESIA;  Surgeon: Luanne Bras, MD;  Location: [facility];  Service: Radiology;  Laterality: N/A;  . SHUNT REVISION  2007  . TOTAL ABDOMINAL HYSTERECTOMY W/ BILATERAL SALPINGOOPHORECTOMY  1994   age 33s  . WRIST SURGERY Left 2014    [name]    Prior to Admission medications   Medication Sig Start Date End Date Taking? Authorizing Provider  aspirin EC 81 MG tablet Take 81 mg by mouth daily.   Yes [provider]  cloNIDine (CATAPRES) 0.3 MG tablet Take 0.3 mg by mouth 2 (two) times daily.    Yes [provider]  diclofenac sodium (VOLTAREN) 1 % GEL Apply 2 application topically 4 (four) times daily as needed (pain).    Yes [provider]  esomeprazole (NEXIUM) 40 MG capsule Take 40 mg by mouth daily as needed (acid reflux). Acid reflux   Yes [provider]  estropipate (OGEN) 1.5 MG tablet Take 0.75 mg by mouth daily.    Yes [provider]  furosemide (LASIX) 20 MG tablet Take 20 mg by mouth daily.   Yes [provider]  gabapentin (NEURONTIN) 100 MG capsule Take 100 mg by mouth at bedtime.   Yes [provider]  glipiZIDE (GLUCOTROL) 10 MG tablet Take 10 mg by mouth 2 (two) times daily before a meal.    Yes [provider]  insulin glargine (LANTUS) 100 unit/mL SOPN Inject 32 Units into the skin at bedtime.   Yes [provider]  Melatonin 10 MG TABS Take 10 mg by mouth at bedtime as needed (sleep).   Yes [provider]  metFORMIN (GLUCOPHAGE-XR) 500 MG 24 hr tablet Take 1,000 mg by mouth 3 (three) times daily with meals.   Yes [provider]  metoprolol (LOPRESSOR) 50 MG tablet Take 50 mg by mouth at bedtime.    Yes [provider]  metoprolol (TOPROL-XL)  100 MG 24 hr tablet Take 100 mg by mouth daily with breakfast.    Yes [provider]  PRESCRIPTION MEDICATION Take 20 mg by mouth See admin instructions. Domperidone 10 mg from [place]: Take 2 tablets (20 mg) by mouth three times daily with meals - for diabetic gastroparesis   Yes [provider]  telmisartan (MICARDIS) 80 MG tablet Take 80 mg by mouth at bedtime.    Yes [provider]  acetaZOLAMIDE (DIAMOX) 250 MG tablet Take 1 tablet (250 mg total) by mouth 3 (three) times daily. Patient not taking: Reported on 06/18/2017 01/15/16   Melvenia Beam, MD    Current Facility-Administered Medications  Medication Dose Route Frequency Provider Last Rate Last Dose  . acetaminophen (TYLENOL) tablet 325-650 mg  325-650 mg Oral Q4H PRN Bary Leriche, PA-C   650 mg at 06/23/17 2683  . acetaZOLAMIDE (DIAMOX) tablet 250 mg  250 mg Per Tube TID Love, Pamela S, PA-C   250 mg at 06/23/17 1417  . albuterol (PROVENTIL) (2.5 MG/3ML) 0.083%  nebulizer solution 2.5 mg  2.5 mg Nebulization Q4H PRN Love, Pamela S, PA-C      . alum & mag hydroxide-simeth (MAALOX/MYLANTA) 200-200-20 MG/5ML suspension 30 mL  30 mL Per Tube Q4H PRN Love, Pamela S, PA-C      . aspirin tablet 325 mg  325 mg Per Tube Daily Bary Leriche, PA-C   325 mg at 06/23/17 0835  . bisacodyl (DULCOLAX) suppository 10 mg  10 mg Rectal Daily PRN Love, Pamela S, PA-C      . chlorhexidine gluconate (MEDLINE KIT) (PERIDEX) 0.12 % solution 15 mL  15 mL Mouth Rinse BID Bary Leriche, PA-C   15 mL at 06/23/17 0839  . cloNIDine (CATAPRES - Dosed in mg/24 hr) patch 0.3 mg  0.3 mg Transdermal Weekly Love, Ivan Anchors, PA-C   0.3 mg at 06/23/17 1359  . clopidogrel (PLAVIX) tablet 75 mg  75 mg Per Tube Q breakfast Bary Leriche, PA-C   75 mg at 06/23/17 1859  . dextrose 5 %-0.45 % sodium chloride infusion   Intravenous Continuous Kirsteins, Luanna Salk, MD 50 mL/hr at 06/23/17 1239    . diclofenac sodium (VOLTAREN) 1 % transdermal gel 2 g   2 g Topical QID PRN Love, Pamela S, PA-C      . diphenhydrAMINE (BENADRYL) 12.5 MG/5ML elixir 12.5-25 mg  12.5-25 mg Oral Q6H PRN Love, Pamela S, PA-C      . docusate (COLACE) 50 MG/5ML liquid 100 mg  100 mg Per Tube Daily Bary Leriche, PA-C   100 mg at 06/23/17 0835  . Domperidone 75m (2x 146mtablets) - Home Medication  20 mg Per Tube TID Love, Pamela S, PA-C   20 mg at 06/23/17 1423  . enoxaparin (LOVENOX) injection 40 mg  40 mg Subcutaneous Q24H LoBary LerichePA-C   40 mg at 06/23/17 [id number]. estropipate (OGEN) tablet 0.75 mg  0.75 mg Per Tube Daily LoBary LerichePA-C   0.75 mg at 06/23/17 0836  . famotidine (PEPCID) 40 MG/5ML suspension 20 mg  20 mg Per Tube Daily LoBary LerichePA-C   20 mg at 06/23/17 081216. [START ON 06/24/2017] feeding supplement (PRO-STAT SUGAR FREE 64) liquid 30 mL  30 mL Per Tube Daily MeWaldemar DickensMD      . [SDerrill MemoN 06/24/2017] feeding supplement (VITAL 1.5 CAL) liquid 1,000 mL  1,000 mL Per Tube Continuous MeWaldemar DickensMD      . [SDerrill MemoN 06/24/2017] free water 200 mL  200 mL Per Tube TID WC & HS MeWaldemar DickensMD      . guaiFENesin-dextromethorphan (ROBITUSSIN DM) 100-10 MG/5ML syrup 5-10 mL  5-10 mL Per Tube Q6H PRN LoBary LerichePA-C   10 mL at 06/23/17 0149  . insulin aspart (novoLOG) injection 0-15 Units  0-15 Units Subcutaneous Q4H LoBary LerichePA-C   3 Units at 06/23/17 1337  . insulin aspart (novoLOG) injection 4 Units  4 Units Subcutaneous Q4H LoBary LerichePA-C   4 Units at 06/23/17 1338  . insulin glargine (LANTUS) injection 20 Units  20 Units Subcutaneous Daily SwMeredith StaggersMD   20 Units at 06/23/17 [id number]. labetalol (NORMODYNE,TRANDATE) injection 10 mg  10 mg Intravenous Q10 min PRN Love, Pamela S, PA-C      . lidocaine (XYLOCAINE) 2 % jelly   Urethral PRN LoBary LerichePA-C      . LORazepam (ATIVAN) injection 0.5 mg  0.5 mg Intravenous Q8H PRN Bary Leriche, PA-C   0.5 mg at 06/23/17 1246  . MEDLINE mouth rinse  15 mL Mouth  Rinse QID Bary Leriche, PA-C   15 mL at 06/23/17 6283  . metoprolol tartrate (LOPRESSOR) tablet 100 mg  100 mg Per Tube BID Bary Leriche, PA-C   100 mg at 06/23/17 1517  . nitrofurantoin (macrocrystal-monohydrate) (MACROBID) capsule 100 mg  100 mg Oral Q12H [name], MD   100 mg at 06/23/17 0835  . ondansetron (ZOFRAN) injection 4 mg  4 mg Intravenous Q6H Love, Ivan Anchors, PA-C   4 mg at 06/23/17 1402  . pantoprazole (PROTONIX) injection 40 mg  40 mg Intravenous Q12H Bary Leriche, PA-C   40 mg at 06/23/17 1408  . prochlorperazine (COMPAZINE) tablet 5-10 mg  5-10 mg Per Tube Q6H PRN Bary Leriche, PA-C   10 mg at 06/23/17 6160   Or  . prochlorperazine (COMPAZINE) injection 5-10 mg  5-10 mg Intramuscular Q6H PRN Love, Pamela S, PA-C       Or  . prochlorperazine (COMPAZINE) suppository 12.5 mg  12.5 mg Rectal Q6H PRN Love, Pamela S, PA-C      . sodium chloride flush (NS) 0.9 % injection 10-40 mL  10-40 mL Intracatheter PRN Kirsteins, Luanna Salk, MD      . sucralfate (CARAFATE) 1 GM/10ML suspension 1 g  1 g Oral TID WC & HS Love, Pamela S, PA-C      . topiramate (TOPAMAX) tablet 50 mg  50 mg Oral Daily Meredith Staggers, MD   50 mg at 06/22/17 2044  . traMADol (ULTRAM) tablet 50 mg  50 mg Per Tube Q6H PRN Bary Leriche, PA-C   50 mg at 06/22/17 1733  . traZODone (DESYREL) tablet 25-50 mg  25-50 mg Oral QHS PRN Love, Ivan Anchors, PA-C        Allergies as of 06/18/2017 - Review Complete 06/03/2017  Allergen Reaction Noted  . Carbamazepine Hives, Shortness Of Breath, and Other (See Comments) 12/29/2010  . Penicillins Anaphylaxis and Other (See Comments) 05/05/2011  . Statins Other (See Comments) 05/05/2011  . Tricyclic antidepressants Other (See Comments) 05/05/2011  . Atorvastatin Other (See Comments) 12/29/2010  . Ezetimibe Other (See Comments) 12/29/2010  . Nortriptyline Other (See Comments) 06/28/2014  . Quinapril hcl Other (See Comments) 12/29/2010  . Rifampin Diarrhea 01/22/2012   . Metoclopramide Nausea And Vomiting and Rash 12/29/2010    Family History  Problem Relation Age of Onset  . Heart disease Father   . Migraines Neg Hx     Social History   Social History  . Marital status: Married    Spouse name: Simona Huh  . Number of children: 2  . Years of education: 16   Occupational History  . Not on file.   Social History Main Topics  . Smoking status: Never Smoker  . Smokeless tobacco: Never Used  . Alcohol use No  . Drug use: No  . Sexual activity: Not on file   Other Topics Concern  . Not on file   Social History Narrative   Lives at home with husband and daughter   Caffeine use: coke daily       Review of Systems: Positive for: GI: Described in detail in HPI.    Gen: anorexia, fatigue, weakness, malaise, involuntary weight loss,Denies any fever, chills, rigors, night sweats and sleep disorder CV: Denies chest pain, angina, palpitations, syncope, orthopnea, PND, peripheral edema, and claudication. Resp:  cough, sputum,Denies dyspnea, wheezing, coughing up blood. GU : Denies urinary burning, blood in urine, urinary frequency, urinary hesitancy, nocturnal urination, and urinary incontinence. MS: Denies joint pain or swelling.  Denies muscle weakness, cramps, atrophy.  Derm: Denies rash, itching, oral ulcerations, hives, unhealing ulcers.  Psych: Denies depression, anxiety, memory loss, suicidal ideation, hallucinations,  and confusion. Heme: Denies bruising, bleeding, and enlarged lymph nodes. Neuro:  Denies any headaches, dizziness, paresthesias. Endo:  Denies any problems with DM, thyroid, adrenal function.  Physical Exam: Vital signs in last 24 hours: Temp:  [97.5 F (36.4 C)-98 F (36.7 C)] 98 F (36.7 C) (09/05 1551) Pulse Rate:  [68-88] 81 (09/05 1551) Resp:  [16-22] 21 (09/05 1551) BP: (122-151)/(61-68) 122/61 (09/05 1551) SpO2:  [98 %-100 %] 100 % (09/05 1551) FiO2 (%):  [28 %] 28 % (09/05 1307) Weight:  [74.5 kg (164 lb 3.9  oz)] 74.5 kg (164 lb 3.9 oz) (09/05 0414) Last BM Date: 06/21/17  General:   Awake,  Well-developed, well-nourished, pleasant and cooperative in NAD Head:  Normocephalic and atraumatic. Tracheostomy in place Eyes:  Sclera clear, no icterus.   Conjunctiva pink. Ears:  Normal auditory acuity. Nose:  No deformity, discharge,  or lesions. Mouth:  No deformity or lesions.  Oropharynx pink & moist. Neck:  Supple; no masses or thyromegaly. Lungs:  Clear throughout to auscultation.   No wheezes, crackles, or rhonchi. No acute distress. Heart:  Regular rate and rhythm; no murmurs, clicks, rubs,  or gallops. Extremities:  Without clubbing or edema. Neurologic:  Awake; right upper extremity weakness, able to move bilateral lower extremities and left upper hand. Skin:  Intact without significant lesions or rashes. Psych:  Alert and cooperative. Normal mood and affect. Abdomen: Midline surgical scar,Soft, nontender and nondistended. No masses, hepatosplenomegaly or hernias noted. Normal bowel sounds, without guarding, and without rebound.         Lab Results:  Recent Labs  06/23/17 0619 06/23/17 1258  WBC 14.5* 10.5  HGB 10.5* 9.9*  HCT 33.1* 31.3*  PLT 434* 419*   BMET  Recent Labs  06/23/17 1258  NA 135  K 4.1  CL 103  CO2 23  GLUCOSE 191*  BUN 30*  CREATININE 0.82  CALCIUM 9.1   LFT  Recent Labs  06/23/17 1258  PROT 7.1  ALBUMIN 3.0*  AST 38  ALT 30  ALKPHOS 129*  BILITOT 0.5   PT/INR No results for input(s): LABPROT, INR in the last 72 hours.  Studies/Results: Ct Head Wo Contrast  Result Date: 06/23/2017 CLINICAL DATA:  Right frontal headache with nausea and vomiting EXAM: CT HEAD WITHOUT CONTRAST TECHNIQUE: Contiguous axial images were obtained from the base of the skull through the vertex without intravenous contrast. COMPARISON:  Head CT June 03, 2017 and brain MRI June 04, 2017 FINDINGS: Brain: There is slight diffuse atrophy. There is no demonstrable  intracranial mass, hemorrhage, extra-axial fluid collection, or midline shift. There is patchy small vessel disease throughout the centra semiovale bilaterally. No focal vascular distribution type infarct is evident. The previously noted multiple small infarcts on diffusion imaging are not delineated on this noncontrast enhanced CT examination. No acute infarct is demonstrated on this study currently. Vascular: There is increased attenuation in the M2 segment of the left middle cerebral artery, a finding that may represent early hyperdense vessel. No similar findings elsewhere. There is calcification in each carotid siphon. There is also calcification in the distal left vertebral artery. Skull: Bony calvarium appears intact. Sinuses/Orbits: There is  opacification throughout much of the inferior left maxillary antrum with what appear to be retention cysts in this area. There is mucosal thickening in several ethmoid air cells. There are apparent retention cysts in the posterior right sphenoid sinus. Other paranasal sinuses appear clear. Orbits appear symmetric bilaterally. Other: Mastoid air cells are clear. IMPRESSION: 1. Question early hyperdense vessel in the periphery of the left middle cerebral artery. Given concern for hyperdense vessel in this area and potential for acute infarct, correlation with brain MRI including diffusion imaging is warranted at this time. 2. Mild atrophy with patchy supratentorial small vessel disease. No well-defined acute infarct evident. The multiple small infarcts noted on recent MR are not well delineated on noncontrast enhanced CT. 3.  No foci of hemorrhage.  No extra-axial fluid collection. 4.  Foci of arterial vascular calcification noted. 5.  Areas of paranasal sinus disease. These results were called by telephone at the time of interpretation on 06/23/2017 at 11:29 am to Reesa Chew, [place] ,who verbally acknowledged these results. Electronically Signed   By: Lowella Grip III  M.D.   On: 06/23/2017 11:29   Dg Chest Port 1 View  Result Date: 06/23/2017 CLINICAL DATA:  Cough, congestion, bloody mucus EXAM: PORTABLE CHEST 1 VIEW COMPARISON:  06/14/2017 FINDINGS: Minimal opacity at the left lung base with blunting of the left costophrenic angle, likely reflecting a small left pleural effusion with left basilar atelectasis. Right lung is clear. No pneumothorax. The heart is normal in size.  Tracheostomy in satisfactory position. Right arm PICC terminates at the cavoatrial junction. Enteric tube courses into the stomach. IMPRESSION: Small left pleural effusion with left basilar atelectasis. Tracheostomy in satisfactory position. Additional support apparatus as above. Electronically Signed   By: Julian Hy M.D.   On: 06/23/2017 14:03   Dg Abd 2 Views  Result Date: 06/23/2017 CLINICAL DATA:  Abdominal distention with vomiting EXAM: ABDOMEN - 2 VIEW COMPARISON:  June 14, 2017 FINDINGS: Supine and upright images were obtained. Feeding tube tip is at the level of the fourth portion of the duodenum. There is contrast in the colon. There is no bowel dilatation or air-fluid level to suggest bowel obstruction. No evident free air. Visualized lung bases are clear. IMPRESSION: No demonstrable bowel obstruction or free air. Feeding tube tip in fourth portion of duodenum. Electronically Signed   By: Lowella Grip III M.D.   On: 06/23/2017 11:50    Impression: 1. Nausea and vomiting? Hemoptysis versus hematemesis 2. History of diabetic gastroparesis 3. CVA, embolism of left carotid artery, right hemiparesis 4. Potential for aspiration, on NG feeds  Plan: H&H stable since 06/18/2017(hemoglobin 9.4, 9.4, 10.5 and 9.9 today), elevated BUN/creatinine ratio of 30/0.82 with a normal GFR. Possibility of stress gastritis versus peptic ulcer disease, with underlying stroke and dual antiplatelet therapy Normal platelets, minimally elevated alkaline phosphatase(normal T bili, AST,  ALT) CAT scan from 06/23/17 no demonstratable intracranial mass/hemorrhage/extra axial fluid collection or midline shift, multiple small infarcts noted on recent MRI   Recommend H&H monitoring every 12-24 hours, transfuse hemoglobin if less than 7. Keep patient on IV PPI and sucralfate suspension via NG tube. If there is evidence of frank melena, hematochezia or drop in hemoglobin or obvious hematemesis patient may benefit from EGD. However, with recent stroke and multiple underlying comorbidities would avoid EGD and anesthesia unless strongly indicated or emergent.  Discussed regarding above with patient's daughter and husband at bedside. Patient currently on aspirin 325 mg and Plavix 75 mg. Would keep patient nothing  by mouth until a.m., continue antiemetics as needed(on Zofran 4 mg IV every 6 hours and Compazine 5-10 mg IM or via NG every 6 hours).  LOS: 5 days   Ronnette Juniper, M.D.  06/23/2017, 4:38 PM  Pager [phone] If no answer or after 5 PM call [phone]

## 2017-06-23 NOTE — Progress Notes (Signed)
Occupational Therapy Note  Patient Details  Name: [name] MRN: [id number] Date of Birth: [birth year]  Today's Date: 06/23/2017 OT Missed Time: 62 Minutes Missed Time Reason: Patient ill (comment);MD hold (comment) (Nausea/ vomiting, on hold per RN)  Therapist attempted to see pt twice today. Pt off unit at 11:00 for imaging. Therapist returned at 1300 and pt on hold for therapy per RN due to nausea/vomiting throughout day. Cont POC as pt able.  Lewis, Yicel Shannon C 06/23/2017, 3:14 PM

## 2017-06-23 NOTE — Progress Notes (Signed)
Upon arrival to patient room to do AM check, rhonchi was auscultated however when asked patient if RT could suction patient refused.  Patient had also stated to RT that today was not a good day.  Stated that she had a major headache.  RN is aware and looking to see if there was any medication that could be given to help patient.  Will continue to monitor.

## 2017-06-23 NOTE — Progress Notes (Signed)
Physical Therapy Session Note  Patient Details  Name: [name] MRN: [id number] Date of Birth: [birth year]  Today's Date: 06/23/2017     Short Term Goals: Week 1:  PT Short Term Goal 1 (Week 1): Pt will transfer with max assist PT Short Term Goal 2 (Week 1): Pt will initiate gait training with PT PT Short Term Goal 3 (Week 1): Pt will tolerate out of bed in w/c x2 hours outside of therapy time.   Skilled Therapeutic Interventions/Progress Updates:   PT attempted to see pt this AM. Pt received semirecumbent in bed with RN and MD present. RN reports that pt is feeling very sick and has been vomiting all morning with severe HA; she is unable to participate at this time. PT will continue to follow and will re-attempts as able.       Therapy Documentation Precautions:  Precautions Precautions: Fall Precaution Comments: cortrak, trach Restrictions Weight Bearing Restrictions: No    Vital Signs: Therapy Vitals Temp: (!) 97.5 F (36.4 C) Temp Source: Oral Pulse Rate: 80 Resp: 18 BP: (!) 151/68 Patient Position (if appropriate): Lying Oxygen Therapy SpO2: 100 % O2 Device: Tracheostomy Collar FiO2 (%): 28 %   See Function Navigator for Current Functional Status.   Therapy/Group: Individual Therapy  Lorie Phenix 06/23/2017, 7:58 AM

## 2017-06-23 NOTE — Progress Notes (Signed)
SLP Cancellation Note  Patient Details Name: [name] MRN: [id number] DOB: 04-Jan-1951   Cancelled treatment:        Pt missed 45 minutes of skilled ST d/t feeling ill and then put on bedpan.                                                                                                 Avalyn Molino 06/23/2017, 11:32 AM

## 2017-06-23 NOTE — Discharge Summary (Signed)
Physician Discharge Summary  Patient ID: [name] MRN: [id number] DOB/AGE: 65-03-1951 65 y.o.  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 Assess for recurrent stroke. Status post endovascular revascularization RIGHT middle cerebral artery occlusion. History of seizures, shunt placed for pseudotumor cerebri, hypertension. EXAM: CT ANGIOGRAPHY HEAD TECHNIQUE: Multidetector CT imaging of the head was performed using the standard protocol during bolus administration of intravenous contrast. Multiplanar CT image reconstructions and MIPs were obtained to evaluate the vascular anatomy. CONTRAST:  50 cc Isovue 370 COMPARISON:  MRI of the head June 04, 2017 and CT HEAD June 23, 2017 at 1111 hours FINDINGS: CT HEAD BRAIN: No intraparenchymal hemorrhage, mass effect nor midline shift. The ventricles and sulci are normal for age. Patchy supratentorial white matter hypodensities within normal range for patient's age, though non-specific are most compatible with chronic small vessel ischemic disease. Multiple small cortical infarcts better demonstrated on prior MRI. No acute large vascular territory infarcts. No abnormal extra-axial fluid collections. Basal cisterns are patent. VASCULAR: Mild calcific atherosclerosis of the carotid siphons. SKULL: No skull fracture. No significant scalp soft tissue swelling. SINUSES/ORBITS:  Nasogastric tube via LEFT nares. LEFT maxillary and RIGHT sphenoid mucosal thickening. Mastoid air cells are well aerated. The included ocular globes and orbital contents are non-suspicious. Soft post bilateral ocular lens implants. OTHER: None. CTA HEAD ANTERIOR CIRCULATION: Patent cervical internal carotid arteries, petrous, cavernous and supra clinoid internal carotid arteries. Widely patent anterior communicating artery. Prominent LEFT posterior communicating artery origin infundibulum. Patent anterior and middle cerebral arteries, mild-to-moderate luminal irregularity. No large vessel occlusion, significant stenosis, contrast extravasation or aneurysm. POSTERIOR CIRCULATION: LEFT vertebral artery is dominant. Patent vertebral arteries, vertebrobasilar junction and basilar artery, as well as main branch vessels. Mild tandem stenosis basilar artery. Patent posterior cerebral arteries, mild to moderate luminal irregularity. Fetal origin RIGHT posterior cerebral artery. Severe stenosis LEFT P3 origin. No large vessel occlusion, contrast extravasation or aneurysm. VENOUS SINUSES: Major dural venous sinuses are patent though not tailored for evaluation on this angiographic examination. ANATOMIC VARIANTS: None. DELAYED PHASE: Mild LEFT LEFT parietal and bifrontal cortical enhancement corresponding to areas of prior infarction. MIP images reviewed. IMPRESSION: CT HEAD: 1. No acute intracranial process. 2. Multiple subacute enhancing infarcts. 3. Moderate chronic small vessel ischemic disease. CTA HEAD: 1. No emergent large vessel occlusion with particular attention to LEFT M2 segment. 2. Mild to moderate intracranial atherosclerosis. Severe stenosis LEFT P3 origin. Electronically Signed   By: Elon Alas M.D.   On: 06/23/2017 20:13    Ct Head Wo Contrast  Result Date: 06/23/2017 CLINICAL DATA:  Right frontal headache with nausea and vomiting EXAM: CT HEAD WITHOUT CONTRAST TECHNIQUE: Contiguous axial images  were obtained from the base of the skull through the vertex without intravenous contrast. COMPARISON:  Head CT June 03, 2017 and brain MRI June 04, 2017 FINDINGS: Brain: There is slight diffuse atrophy. There is no demonstrable intracranial mass, hemorrhage, extra-axial fluid collection, or midline  shift. There is patchy small vessel disease throughout the centra semiovale bilaterally. No focal vascular distribution type infarct is evident. The previously noted multiple small infarcts on diffusion imaging are not delineated on this noncontrast enhanced CT examination. No acute infarct is demonstrated on this study currently. Vascular: There is increased attenuation in the M2 segment of the left middle cerebral artery, a finding that may represent early hyperdense vessel. No similar findings elsewhere. There is calcification in each carotid siphon. There is also calcification in the distal left vertebral artery. Skull: Bony calvarium appears intact. Sinuses/Orbits: There is opacification throughout much of the inferior left maxillary antrum with what appear to be retention cysts in this area. There is mucosal thickening in several ethmoid air cells. There are apparent retention cysts in the posterior right sphenoid sinus. Other paranasal sinuses appear clear. Orbits appear symmetric bilaterally. Other: Mastoid air cells are clear. IMPRESSION: 1. Question early hyperdense vessel in the periphery of the left middle cerebral artery. Given concern for hyperdense vessel in this area and potential for acute infarct, correlation with brain MRI including diffusion imaging is warranted at this time. 2. Mild atrophy with patchy supratentorial small vessel disease. No well-defined acute infarct evident. The multiple small infarcts noted on recent MR are not well delineated on noncontrast enhanced CT. 3.  No foci of hemorrhage.  No extra-axial fluid collection. 4.  Foci of arterial vascular calcification noted. 5.  Areas of  paranasal sinus disease. These results were called by telephone at the time of interpretation on 06/23/2017 at 11:29 am to Reesa Chew, [place] ,who verbally acknowledged these results. Electronically Signed   By: Lowella Grip III M.D.   On: 06/23/2017 11:29   Ir Gastrostomy Tube Mod Sed  Result Date: 06/25/2017 INDICATION: 66 year old female with dysphagia due to recent stroke EXAM: Fluoroscopically guided placement of percutaneous pull-through gastrostomy tube Interventional Radiologist:  Criselda Peaches, MD MEDICATIONS: 1 g vancomycin; Antibiotics were administered within 1 hour of the procedure. 1 mg glucagon was also administered intravenously. ANESTHESIA/SEDATION: Versed 2 mg IV; Fentanyl 100 mcg IV Moderate Sedation Time:  15 minutes The patient was continuously monitored during the procedure by the interventional radiology nurse under my direct supervision. CONTRAST:  10 mL Isovue-300 FLUOROSCOPY TIME:  Fluoroscopy Time: 4 minutes 12 seconds (18 mGy). COMPLICATIONS: None immediate. PROCEDURE: Informed written consent was obtained from the patient after a thorough discussion of the procedural risks, benefits and alternatives. All questions were addressed. Maximal Sterile Barrier Technique was utilized including caps, mask, sterile gowns, sterile gloves, sterile drape, hand hygiene and skin antiseptic. A timeout was performed prior to the initiation of the procedure. Maximal barrier sterile technique utilized including caps, mask, sterile gowns, sterile gloves, large sterile drape, hand hygiene, and chlorhexadine skin prep. An angled catheter was advanced over a wire under fluoroscopic guidance through the nose, down the esophagus and into the body of the stomach. The stomach was then insufflated with several 100 ml of air. Fluoroscopy confirmed location of the gastric bubble, as well as inferior displacement of the barium stained colon. Under direct fluoroscopic guidance, a single T-tack was placed, and  the anterior gastric wall drawn up against the anterior abdominal wall. Percutaneous access was then obtained into the mid gastric body with an 18 gauge sheath needle. Aspiration of air, and injection of contrast material under fluoroscopy confirmed needle placement. An Amplatz wire was advanced in the gastric body and the access needle exchanged for a 9-French vascular sheath. A snare device was advanced  through the vascular sheath and an Amplatz wire advanced through the angled catheter. The Amplatz wire was successfully snared and this was pulled up through the esophagus and out the mouth. A 20-French Alinda Dooms MIC-PEG tube was then connected to the snare and pulled through the mouth, down the esophagus, into the stomach and out to the anterior abdominal wall. Hand injection of contrast material confirmed intragastric location. The T-tack retention suture was then cut. The pull through peg tube was then secured with the external bumper and capped. The patient will be observed for several hours with the newly placed tube on low wall suction to evaluate for any post procedure complication. The patient tolerated the procedure well, there is no immediate complication. IMPRESSION: Successful placement of a 20 French pull through gastrostomy tube. Electronically Signed   By: Jacqulynn Cadet M.D.   On: 06/25/2017 17:07   Dg Chest Port 1 View  Result Date: 06/23/2017 CLINICAL DATA:  Cough, congestion, bloody mucus EXAM: PORTABLE CHEST 1 VIEW COMPARISON:  06/14/2017 FINDINGS: Minimal opacity at the left lung base with blunting of the left costophrenic angle, likely reflecting a small left pleural effusion with left basilar atelectasis. Right lung is clear. No pneumothorax. The heart is normal in size.  Tracheostomy in satisfactory position. Right arm PICC terminates at the cavoatrial junction. Enteric tube courses into the stomach. IMPRESSION: Small left pleural effusion with left basilar atelectasis.  Tracheostomy in satisfactory position. Additional support apparatus as above. Electronically Signed   By: Julian Hy M.D.   On: 06/23/2017 14:03    Dg Abd 2 Views  Result Date: 06/23/2017 CLINICAL DATA:  Abdominal distention with vomiting EXAM: ABDOMEN - 2 VIEW COMPARISON:  June 14, 2017 FINDINGS: Supine and upright images were obtained. Feeding tube tip is at the level of the fourth portion of the duodenum. There is contrast in the colon. There is no bowel dilatation or air-fluid level to suggest bowel obstruction. No evident free air. Visualized lung bases are clear. IMPRESSION: No demonstrable bowel obstruction or free air. Feeding tube tip in fourth portion of duodenum. Electronically Signed   By: Lowella Grip III M.D.   On: 06/23/2017 11:50    Labs:  Basic Metabolic Panel: BMP Latest Ref Rng & Units 07/06/2017 07/01/2017 06/25/2017  Glucose 65 - 99 mg/dL 118(H) 227(H) 173(H)  BUN 6 - 20 mg/dL 25(H) 24(H) 28(H)  Creatinine 0.44 - 1.00 mg/dL 1.21(H) 0.88 0.96  BUN/Creat Ratio 11 - 26 - - -  Sodium 135 - 145 mmol/L 134(L) 133(L) 138  Potassium 3.5 - 5.1 mmol/L 4.0 3.3(L) 3.7  Chloride 101 - 111 mmol/L 104 101 107  CO2 22 - 32 mmol/L 20(L) 25 22  Calcium 8.9 - 10.3 mg/dL 9.3 8.6(L) 9.1    CBC: CBC Latest Ref Rng & Units 07/06/2017 06/30/2017 06/25/2017  WBC 4.0 - 10.5 K/uL 9.7 10.2 9.4  Hemoglobin 12.0 - 15.0 g/dL 9.9(L) 9.6(L) 9.7(L)  Hematocrit 36.0 - 46.0 % 32.1(L) 30.5(L) 30.5(L)  Platelets 150 - 400 K/uL 412(H) 414(H) 380    CBG:  Recent Labs Lab 07/07/17 0647 07/07/17 1157 07/07/17 1653 07/07/17 2139 07/08/17 0611  GLUCAP 105* 206* 97 161* 151*    Brief HPI:   Morgan C Holstonis a 66 y.o.femalewith history of DM, HTN, peripheral neuropathy, pseudotumor cerebri, seizure disorder, one week history of right hand weakness and HA who had MRI brain done 8/12 revealing acute infarcts in left parietal cortex, white matter and occipital lobe. She was sent to the  ED  for full work up andCTA head/neck done revealing large non-calcified plaque in proximal L-ICA causing 60-65% stenosis and likely source of emboli.   She underwent cerebral angiogram for work up on 8/16 revealing <50% L-ICA stenosis and during the procedure became unresponsive with bilateral UE tonic posturing and left sided weakness. She was taken to IR for endovascualr revascularization of B-MCA occlusions with TICI reperfusion bilaterally. Dr. Erlinda Hong recommended DAPT x 3 months followed by Plavix alone.Follow up MRI brain revealed numerous acute new supra and infratentorial infarcts involving B-MCA, and bilateral PCA- no hemorrhage. Hospital course course significant difficulty with vent wean requiring tracheostomy, MSSA bronchitis v/s PNA, severe dysphagia--NPO as well as drop in H/H requiring transfusion with one unit PRBC. She was showing improvement in participation and activity tolerance. CIR was recommended due to decline in mobility and ability to carry out ADL tasks.     Hospital Course: ASHLY YEPEZ was admitted to rehab 06/18/2017 for inpatient therapies to consist of PT, ST and OT at least three hours five days a week. Past admission physiatrist, therapy team and rehab RN have worked together to provide customized collaborative inpatient rehab. Secretions via trach were felt to be in part due to GERD and she was maintained on strict aspiration precautions.  She has continued to have GI issues with intermittent N/V. She had intractable N/V on 9/5 and CT head was repeated to rule out new stroke.  CT questioned hyperdense sign and neurology recommended MRI for work up. Patient refused this due to anxiety  and CTA head/neck was negative for acute intracranial process with no emergent large vessel occlusion.  She did have episode of hemoptysis later that day and GI was consulted for input. Dr. Therisa Doyne questioned hemoptysis v/s hematemesis and recommended serial H/H. She questioned stress gastritis v/s  PUD and recommended IV PPI with Carafate via NG and monitoring for melena. Tube feeds and antiplatelets were held for a day.  No further work up was indicated as H/H was stable without recurrent episodes.    She showed improvement in tolerance of PMSV and was started on trach plugging. She was decannulated without difficulty on 9/18 and respiratory status has been stable. Topamax was added to help manage persistent HA and titrated upwards without side effects. Her blood pressures have been monitored on bid basis and have started trending down. She was encouraged to increase fluid intake and Catapres patch was decreased to TTS 2 on 9/18. Her mood has improved with ego support and encouragement provided by team. Daughter has been very supportive and has been present to help with psychological support, encouragement and has been present for most therapy sessions.    She did required PEG placement by Dr. Laurence Ferrari (IVR) on 06/25/17  and was started on tube feeds for nutritional support. Compazine was scheduled to help with GI symptoms. As swallow function improved, she was advanced to dypshagia 2, thin liquids by discharge. Diabetes has been monitored with ac/hs cbg checks and Lantus was decreased to 15 units to prevent hypoglycemic episodes.  Dehydration is improving and hypokalemia has resolved with addition of supplement. H/H has been stable and leucocytosis has resolved. She has had improvement in mobility but continues to be limited by right homonymous hemianopsia, right hemiparesis with  Ataxia as well as cognitive deficit affecting safety awareness and processing. She has progressed to supervision to min assist level and will continue to receive follow up [place], [place], [place] and [place] by [facility] after discharge.  Rehab course: During patient's stay in rehab weekly team conferences were held to monitor patient's progress, set goals and discuss barriers to discharge. At admission, patient required + 2  total assist with mobility and mod to total assist for ADLs.  She exhibited moderate cognitive deficits affecting recall of new information, basic problem solving with poor attention, deficits in initiation and poor awareness of deficits. She has had improvement in activity tolerance, balance, postural control, as well as ability to compensate for deficits. She is has had improvement in functional use RUE  and RLEas well as improved awareness. She requires min assist to supervision for cognitive tasks. Family education has been completed regarding assistance neede for mobility, safety, and due to cognitive issues.  She is able to complete ADL tasks with in assist.  She is able to perform transfers with supervision and is ambulating 76' with RW and supervision. She requires min assist for car transfers and to climb 12 stairs.  Family education was completed regarding all aspects of care and safety.    Disposition: 01-Home or Self Care  Diet: dysphagia 2, thin liquids.   Special Instructions: 1 Check BS ac/hs and follow up with primary MD for adjustment of diabetic meds. 2. Needs to drink plenty fluids to keep hydrated.  Can adjust water flushes via tube as needed.  3. Needs BMET/CBC rechecked in  1-2 weeks.    Discharge Instructions    Ambulatory referral to Physical Medicine Rehab    Complete by:  As directed    1-2 weeks transitional care appt     Allergies as of 07/08/2017      Reactions   Carbamazepine Hives, Shortness Of Breath, Other (See Comments)   Penicillins Anaphylaxis, Other (See Comments)   Mother, father and brother have history of anaphylaxis reaction to penicillin so pt does not take   Statins Other (See Comments)   Severe muscle weakness, leg numbness, severe headaches, chest pain   Tricyclic Antidepressants Other (See Comments)   IMPAIRED MEMORY   Atorvastatin Other (See Comments)   Severe muscle weakness, leg numbness, severe headaches, chest pain   Ezetimibe Other (See  Comments)   Severe muscle weakness, leg numbness, severe headaches, chest pain   Nortriptyline Other (See Comments)   IMPAIRED MEMORY   Quinapril Hcl Other (See Comments)   Unknown reaction   Rifampin Diarrhea   Metoclopramide Nausea And Vomiting, Rash      Medication List    STOP taking these medications   aspirin EC 81 MG tablet Replaced by:  aspirin 325 MG tablet   cloNIDine 0.3 MG tablet Commonly known as:  CATAPRES Replaced by:  cloNIDine 0.2 mg/24hr patch   furosemide 20 MG tablet Commonly known as:  LASIX   gabapentin 100 MG capsule Commonly known as:  NEURONTIN   glipiZIDE 10 MG tablet Commonly known as:  GLUCOTROL   Melatonin 10 MG Tabs   metFORMIN 500 MG 24 hr tablet Commonly known as:  GLUCOPHAGE-XR   metoprolol succinate 100 MG 24 hr tablet Commonly known as:  TOPROL-XL   telmisartan 80 MG tablet Commonly known as:  MICARDIS     TAKE these medications   acetaZOLAMIDE 250 MG tablet Commonly known as:  DIAMOX Take 1 tablet (250 mg total) by mouth 3 (three) times daily.   aspirin 325 MG tablet Take 1 tablet (325 mg total) by mouth daily. Replaces:  aspirin EC 81 MG tablet   cloNIDine 0.2 mg/24hr patch Commonly known as:  CATAPRES - Dosed in  mg/24 hr Place 1 patch (0.2 mg total) onto the skin once a week. On wednesdays Replaces:  cloNIDine 0.3 MG tablet   clopidogrel 75 MG tablet Commonly known as:  PLAVIX Take 1 tablet (75 mg total) by mouth daily with breakfast.   diclofenac sodium 1 % Gel Commonly known as:  VOLTAREN Apply 2 application topically 4 (four) times daily as needed (pain).   docusate sodium 100 MG capsule Commonly known as:  COLACE Take 1 capsule (100 mg total) by mouth daily.   esomeprazole 40 MG capsule Commonly known as:  NEXIUM Take 40 mg by mouth daily as needed (acid reflux). Acid reflux   estropipate 1.5 MG tablet Commonly known as:  OGEN Take 0.75 mg by mouth daily.   free water Soln Take 200 mLs by mouth every  8 (eight) hours. Use filtered water   insulin glargine 100 unit/mL Sopn Commonly known as:  LANTUS Inject 0.2 mLs (20 Units total) into the skin at bedtime. What changed:  how much to take   metoprolol tartrate 100 MG tablet Commonly known as:  LOPRESSOR Take 1 tablet (100 mg total) by mouth 2 (two) times daily. What changed:  medication strength  how much to take  when to take this   potassium chloride 20 MEQ packet Commonly known as:  KLOR-CON Take 20 mEq by mouth daily.   PRESCRIPTION MEDICATION Take 20 mg by mouth See admin instructions. Domperidone 10 mg from [place]: Take 2 tablets (20 mg) by mouth three times daily with meals - for diabetic gastroparesis   prochlorperazine 5 MG tablet Commonly known as:  COMPAZINE Take 1 tablet (5 mg total) by mouth every 8 (eight) hours as needed for nausea or vomiting. Have been taking it with meals--wean as able   topiramate 50 MG tablet Commonly known as:  TOPAMAX Take 1 tablet (50 mg total) by mouth 2 (two) times daily.             Follow-up Information    Kirsteins, Luanna Salk, MD Follow up.   Specialty:  Physical Medicine and Rehabilitation Why:  office will call you with follow up appointment Contact information: [place] [address] [phone]        [name], MD. Call in 1 day(s).   Specialty:  Internal Medicine Why:  for post hospital follow up in 1-2 weeks.  Contact information: [address] [place] [address] [phone]        Gastroenterology, [facility] (Dr. Therisa Doyne) Follow up as needed.   Contact information: [place] [address] [phone]           Signed: Bary Leriche 07/09/2017, 4:07 PM

## 2017-06-23 NOTE — Progress Notes (Signed)
[place] PHYSICAL MEDICINE & REHABILITATION     PROGRESS NOTE  Subjective/Complaints:   Vomiting overnite, denies abd pain ROS: limited  Objective:  Vital Signs: Blood pressure (!) 151/68, pulse 78, temperature (!) 97.5 F (36.4 C), temperature source Oral, resp. rate 16, height 5\' 4"  (1.626 m), weight 74.5 kg (164 lb 3.9 oz), SpO2 100 %. No results found.  Recent Labs  06/23/17 0619  WBC 14.5*  HGB 10.5*  HCT 33.1*  PLT 434*   No results for input(s): NA, K, CL, GLUCOSE, BUN, CREATININE, CALCIUM in the last 72 hours.  Invalid input(s): CO CBG (last 3)   Recent Labs  06/22/17 2029 06/23/17 0004 06/23/17 0409  GLUCAP 207* 208* 198*    Wt Readings from Last 3 Encounters:  06/23/17 74.5 kg (164 lb 3.9 oz)  06/18/17 76.4 kg (168 lb 6.9 oz)  01/15/16 79.3 kg (174 lb 12.8 oz)    Physical Exam:  BP (!) 151/68 (BP Location: Right Arm)   Pulse 78   Temp (!) 97.5 F (36.4 C) (Oral)   Resp 16   Ht 5\' 4"  (1.626 m)   Wt 74.5 kg (164 lb 3.9 oz)   SpO2 100%   BMI 28.19 kg/m  Constitutional: She appears well-developed and well-nourished.  HENT: Normocephalic and atraumatic.  Eyes: EOM are normal. No discharge Neck: CFS #6 without drainage today Cardiovascular: RRR. No JVD Respiratory: normal effort. Fair flow, rhonchi heard GI: distended , reduced BS, NT  +tympany Musculoskeletal: She exhibits no edema or tenderness.  Neurological: Alert  Left facial droop with dysarthric speech.  Right sided weakness with decrease in motor control.  Right homonymous hemianopsia RUE grossly 3- to 3/5.Marland Kitchen  RLE grossly 4-/5.  Skin: Skin is warm and dry. She is not diaphoretic.  Psychiatric: affect flat, processing delayed Assessment/Plan: 1. 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 Physiatrist is providing close team  supervision and 24 hour management of active medical problems listed below. Physiatrist and rehab team continue to assess barriers to discharge/monitor patient progress toward functional and medical goals.  Function:  Bathing Bathing position   Position: Bed  Bathing parts Body parts bathed by patient: Chest, Abdomen, Right upper leg Body parts bathed by helper: Right arm, Left arm, Left upper leg, Right lower leg, Left lower leg, Back  Bathing assist Assist Level:  (MaxA )      Upper Body Dressing/Undressing Upper body dressing   What is the patient wearing?: Pull over shirt/dress       Pull over shirt/dress - Perfomed by helper: Thread/unthread right sleeve, Thread/unthread left sleeve, Put head through opening, Pull shirt over trunk        Upper body assist Assist Level:  (MaxA )   Set up : To obtain clothing/put away  Lower Body Dressing/Undressing Lower body dressing   What is the patient wearing?: Pants, Non-skid slipper socks       Pants- Performed by helper: Thread/unthread right pants leg, Thread/unthread left pants leg, Pull pants up/down   Non-skid slipper socks- Performed by helper: Don/doff right sock, Don/doff left sock                  Lower body assist Assist for lower body dressing:  (MaxA )      Toileting Toileting Toileting activity did not occur: No continent bowel/bladder event  Toileting assist     Transfers Chair/bed transfer   Chair/bed transfer method: Stand pivot Chair/bed transfer assist level: Maximal assist (Pt 25 - 49%/lift and lower) Chair/bed transfer assistive device: Armrests     Locomotion Ambulation Ambulation activity did not occur: Safety/medical concerns         Wheelchair          Cognition Comprehension Comprehension assist level: Understands basic 50 - 74% of the time/ requires cueing 25 - 49% of the time  Expression Expression assist level: Expresses basic 50 - 74% of the time/requires cueing 25 -  49% of the time. Needs to repeat parts of sentences.  Social Interaction Social Interaction assist level: Interacts appropriately 25 - 49% of time - Needs frequent redirection.  Problem Solving Problem solving assist level: Solves basic 25 - 49% of the time - needs direction more than half the time to initiate, plan or complete simple activities  Memory Memory assist level: Recognizes or recalls 25 - 49% of the time/requires cueing 50 - 75% of the time    Medical Problem List and Plan:  1. 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 DVT Prophylaxis/Anticoagulation: Pharmaceutical: Lovenox  3. Headaches/Pain Management:   Topamax started 9/1--pt with persistent headaches   -increase to 50mg  and change to evening dosing 4. Mood: LCSW to follow for evaluation and support.  5. Neuropsych: This patient is not capable of making decisions on her own behalf.  6. Skin/Wound Care: Routine pressure relief measures.  7. Fluids/Electrolytes/Nutrition: Monitor I/Os  Elevated BUN 9/1 8. MSSA PNA: Completed treatment 7 day course of cefazolin on 8/30. Continue to monitor with strict aspiration precautions--HOB > 30 degrees at all times.  9. VDRF: Tolerating PMSV with supervision. Secretions felt to be in part due to GERD.   -continue #6 trach until secretion mgt/swallow better 10 HTN: Monitor BP bid--on lopressor bid and catapres  bp controlled 9/3 11. ABLA: Improved with transfusion.   Hemoglobin 9.4 on 9/1  Continue to monitor 12. Anxiety disorder:Team to provide ego support. Daughter staying to encourage patient. Palliative following for support and placed on IV ativan tid for symptoms management--decrease to prn due to sedation.  13. T2DM: Hgb A1C-  - CBG (last 3)   Recent Labs   06/22/17 2029 06/23/17 0004 06/23/17 0409  GLUCAP 207* 208* 198*  Fair control   -increase lantus to 25 u daily  -continue scheduled novolog 14. 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 Leucocytosis: persistent .Monitor for signs of infection.   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 B-MCA stenosis/L-ICA stenosis s/p revascularization: On ASA/Plavix.  19. Acute urinary retention:   Foley out  Ua+, ucx with multispecies- Resent  Empiric Macrobid started 9/1. Will stop and send urine for re-culture 20.  Vomiting with distended abd, also seem to be using LUE less today althought she states she has some fluctuation with psudotumor cerebri,  Will check KUB to look for ileus, will also check CT head for new infarct, has a thoraco peritoneal shunt, if above neg may need neuro eval LOS (Days) 5 A FACE TO FACE EVALUATION WAS PERFORMED  Spenser Cong E 06/23/2017 9:07 AM

## 2017-06-23 NOTE — Plan of Care (Signed)
Problem: RH BOWEL ELIMINATION Goal: RH STG MANAGE BOWEL W/MEDICATION W/ASSISTANCE STG Manage Bowel with Medication with min Assistance.  No bowel movement this shift  Problem: RH BLADDER ELIMINATION Goal: RH STG MANAGE BLADDER WITH EQUIPMENT WITH ASSISTANCE STG Manage Bladder With Equipment With mod Assistance  Outcome: Progressing Using bed pain this shift  Problem: RH SKIN INTEGRITY Goal: RH STG SKIN FREE OF INFECTION/BREAKDOWN Outcome: Not Progressing abrasions noted to buttocks, skin care performed, cream applied Goal: RH STG MAINTAIN SKIN INTEGRITY WITH ASSISTANCE STG Maintain Skin Integrity With mod Assistance.  Outcome: Progressing Total assistance with 2 helpers  Problem: RH PAIN MANAGEMENT Goal: RH STG PAIN MANAGED AT OR BELOW PT'S PAIN GOAL Managed at [place] 2/10  Outcome: Progressing Denies pain

## 2017-06-23 NOTE — Progress Notes (Signed)
Patient water bottle and trach collar setup changed without complications.

## 2017-06-23 NOTE — Patient Care Conference (Signed)
Inpatient RehabilitationTeam Conference and Plan of Care Update Date: 06/23/2017   Time: 10:50 Am    Patient Name: [name]      Medical Record Number: [id number]  Date of Birth: [birth year] Sex: Female         Room/Bed: 4W10C/4W10C-01 Payor Info: Payor: [employer] MEDICARE / Plan: [employer] MEDICARE / Product Type: *No Product type* /    Admitting Diagnosis: B CVA  Admit Date/Time:  06/18/2017  5:48 PM Admission Comments: No comment available   Primary Diagnosis:  <principal problem not specified> Principal Problem: <principal problem not specified>  Patient Active Problem List   Diagnosis Date Noted  . Hemoptysis   . Hematemesis   . Intractable vomiting with nausea   . Acute lower UTI   . Urinary retention   . Diabetic gastroparesis ([place])   . Vascular headache   . Diabetes mellitus ([place])   . CVA (cerebral vascular accident) ([place]) 06/18/2017  . Weakness   . 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 Right hemiparesis ([place])   . DNR (do not resuscitate) discussion 06/16/2017  . Palliative care by specialist 06/16/2017  . Anxiety state   . Acute embolic stroke ([place])   . Benign essential HTN   . Diabetic peripheral neuropathy ([place])   . Pseudotumor cerebri   . Hypokalemia   . Seizure ([place])   . Leukocytosis   . Acute blood loss anemia   . Post-operative pain   . Encounter for feeding tube placement   . Respiratory failure ([place])   . Status post tracheostomy ([place])   . Acute respiratory failure with hypoxemia ([place])   . Stenosis of left carotid artery   . Acute CVA (cerebrovascular accident) ([place]) 05/31/2017  . Type II diabetes mellitus with complication ([place]) 82/99/3716  . Hypertension 05/31/2017  . Hyponatremia 05/31/2017  . IIH (idiopathic intracranial hypertension) 01/18/2016  . 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 Worsening headaches 01/18/2016  . Increased abdominal girth  01/18/2016  . Abdominal fluid collection 01/18/2016  . Vision changes 01/18/2016  . Perceived hearing changes 01/18/2016  . Nausea without vomiting 01/18/2016  . Peripheral vision loss 01/18/2016  . Imbalance 01/18/2016  . Falls 01/18/2016  . Dysphagia, oropharyngeal 01/18/2016  . Subacute confusional state 01/18/2016  . Left breast mass 05/05/2011    Expected Discharge Date:    Team Members Present: Physician leading conference: Dr. Alysia Penna Social Worker Present: Ovidio Kin, LCSW Nurse Present: Other (comment) Dimitri Ped) PT Present: Barrie Folk, PT;Rosita Dechalus, PTA OT Present: Napoleon Form, OT SLP Present: Stormy Fabian, SLP PPS Coordinator present : Daiva Nakayama, RN, CRRN     Current Status/Progress Goal Weekly Team Focus  Medical   refusing po trials, decrease insight cognition, vomiting,   maintain medical stability  work up of vomiting   Bowel/Bladder   Continent of bladder once this shift, using bedpan to urinate, no bowel movement this shift, was given Fleet enema 06/21/2017 with full relief but was incontinent    To be continent of B/B with regular bowel movenet daily  assess for constipation and offer suppository of Fleet enema prn as ordered, toilet patient every 3 to 4 hours while awake and as neede at HS, encourage patient to get up to [facility] with assistance   Swallow/Nutrition/ Hydration             ADL's   Very limited OT participation at this point due to nausea/  vomiting.  Max A supine> sit; min A sitting EOB; max - total A ADLs  Set at lofty min A currently, may have to downgrade  Activity/ OOB tolerance, ADL re-training   Mobility   maxA supine to sit, modA sit to supine, max/total A sit to stand  min assist, w/c level primarily  activity tolerance, sitting balance, transfers, wc mobilty   Communication             Safety/Cognition/ Behavioral Observations  safety precautions and fall prevention maintained, does not attempt to bet out of bed  without assistance, daughter stays at bed side and call for assistance when needed  To stay safe and free of fall and injury while in the rebah  Continue to monitor par patient for safety   Pain   Denies pain  Remain pain free   Continue to assess for pain q shift and prn   Skin   Small opened areas noted to buttocks  Maintain skin integrity, prevent skin breakdown and infection  Assess skin daily, performskin care as needed, apply moisture barrier as needed, turn and reposition q 2hrs while in the bed and /or up in the chair      *See Care Plan and progress notes for long and short-term goals.     Barriers to Discharge  Current Status/Progress Possible Resolutions Date Resolved   Physician    Medical stability;Decreased caregiver support;Other (comments)  therapy compliance     medical workup and treatment      Nursing  Decreased caregiver support;Medical stability;Home environment access/layout;Incontinence;Medication compliance;Nutrition means               PT                    OT                  SLP                SW Decreased caregiver support Family members work will need to come up with a plan            Discharge Planning/Teaching Needs:  Family working on a plan for discharge home-husband works part time and daughter works. Aware pt will need 24 hr care at DC. Here daily and attending therapies with pt.      Team Discussion:  Vomiting today-MD has ordered CT scan and KUB to check for ileus. Holding tf's and meds. Headache complaining of last few days. Goals min assist wheelchair level. Activity tolerance and fatigue limiting factors. Waiting until next week to set target LOS  Revisions to Treatment Plan:  Medical issues    Continued Need for Acute Rehabilitation Level of Care: The patient requires daily medical management by a physician with specialized training in physical medicine and rehabilitation for the following conditions: Daily direction of a  multidisciplinary physical rehabilitation program to ensure safe treatment while eliciting the highest outcome that is of practical value to the patient.: Yes Daily medical management of patient stability for increased activity during participation in an intensive rehabilitation regime.: Yes Daily analysis of laboratory values and/or radiology reports with any subsequent need for medication adjustment of medical intervention for : Other;Nutritional problems;Neurological problems  Elease Hashimoto 06/24/2017, 8:27 AM

## 2017-06-23 NOTE — Significant Event (Signed)
Patient did not feel good all am ,severe nausea, vomiting,and headache. Dr Danae Chen and Algis Liming were  informed .

## 2017-06-23 NOTE — Progress Notes (Signed)
Dr. Jasmine December called to report that CT head shows early hyperdense sign M2.  MRI/MRA ordered for work up. Relayed results to Dr. Leonie Man who will follow up for input. Per nursing patient started having issues with nausea around 2 am. This worsened with inability to keep tube feeds/meds down due to N/V  from 7 am - 9 am.

## 2017-06-23 NOTE — Progress Notes (Signed)
STROKE TEAM PROGRESS NOTE   SUBJECTIVE (INTERVAL HISTORY) Her daughter is at the bedside.   Stroke team was asked to see the patient as she was having severe vomiting and nausea for last 2 days. She had a CT scan of the head done earlier today which I personally reviewed shows no acute stroke but there is a question of hyperdensity suggesting a left M3 clot. Patient and family deny significant changes in her speech or right sided strength. She does have persistent right sided weakness from her recent strokes  OBJECTIVE Temp:  [97.5 F (36.4 C)-98 F (36.7 C)] 98 F (36.7 C) (09/05 1551) Pulse Rate:  [68-88] 81 (09/05 1702) Resp:  [16-22] 19 (09/05 1702) BP: (122-151)/(61-68) 122/61 (09/05 1551) SpO2:  [98 %-100 %] 100 % (09/05 1702) FiO2 (%):  [28 %] 28 % (09/05 1702) Weight:  [164 lb 3.9 oz (74.5 kg)] 164 lb 3.9 oz (74.5 kg) (09/05 0414)  CBC:   Recent Labs Lab 06/19/17 1232 06/23/17 0619 06/23/17 1258  WBC 11.4* 14.5* 10.5  NEUTROABS 7.8*  --  8.6*  HGB 9.4* 10.5* 9.9*  HCT 30.7* 33.1* 31.3*  MCV 89.0 88.0 87.7  PLT 396 434* 419*    Basic Metabolic Panel:   Recent Labs Lab 06/19/17 1232 06/23/17 1258  NA 135 135  K 4.2 4.1  CL 104 103  CO2 23 23  GLUCOSE 146* 191*  BUN 30* 30*  CREATININE 0.87 0.82  CALCIUM 8.7* 9.1    Lipid Panel:     Component Value Date/Time   CHOL 297 (H) 06/02/2017 0741   TRIG 220 (H) 06/10/2017 0630   HDL 49 06/02/2017 0741   CHOLHDL 6.1 06/02/2017 0741   VLDL 37 06/02/2017 0741   LDLCALC 211 (H) 06/02/2017 0741   HgbA1c:  Lab Results  Component Value Date   HGBA1C 9.5 (H) 06/02/2017   Urine Drug Screen: No results found for: LABOPIA, COCAINSCRNUR, LABBENZ, AMPHETMU, THCU, LABBARB  Alcohol Level No results found for: [place] I have personally reviewed the radiological images below and agree with the radiology interpretations.  Ct Angio Head and neck W Or Wo Contrast 05/31/2017 IMPRESSION: 1. No emergent large vessel  occlusion. 2. Large amount of noncalcified plaque at the proximal left internal carotid artery, causing 60-65% stenosis. This is the most likely source of emboli for the previously identified left parietal and occipital infarcts. 3. No other intracranial or cervical arterial stenosis. 4. Minimal Aortic Atherosclerosis (ICD10-I70.0).   Mr Jeri Cos Wo Contrast 05/30/2017 IMPRESSION: Multiple small areas of acute infarct in the left parietal cortex and left occipital cortex. Recommend CTA head and neck to evaluate for source of emboli or stenosis causing these infarcts Numerous chronic white matter infarcts bilaterally   CUS 06/01/2017 1-39% right internal carotid artery stenosis and 40-59% left internal carotid artery stenosis. Vertebral arteries are patent with antegrade flow.  TTE 8/114/2018  - Left ventricle: The cavity size was normal. Systolic function was normal. The estimated ejection fraction was in the range of 60% to 65%. Wall motion was normal; there were no regional wall motion abnormalities. Doppler parameters are consistent with abnormal left ventricular relaxation (grade 1 diastolic dysfunction).  Carotid Angiography 06/03/2017 FINDING(S):  Type I aortic arch: widely patent  Innominate artery: widely patent ? R common carotid artery: widely patent ? R internal carotid artery: minimal disease, diffuse atherosclerotic plaque suggested ? R external carotid artery: minimal disease, diffuse atherosclerotic plaque suggested ? R subclavian artery: patent ? R vertebral  artery: patent proximally  Left common carotid artery: patent ? L internal carotid artery: patent, 43% stenosis by NASCET proximally ? L external carotid artery: patent  Left subclavian artery: patent ? Left vertebral artery: patent  Intracranial findings to be read by Neuroradiology  CT Head Code Stroke  06/03/2017 IMPRESSION: 1. No acute finding or change from 3 days ago. 2. Known recent small cortical infarcts  in the left cerebrum are not clearly visualized. Chronic small vessel ischemic changes elsewhere. are stable  CTA Head/Neck 06/03/2017 IMPRESSION: 1. Right M1 embolus with subtotal occlusion but severe stenosis. The anterior temporal branch is covered and non-opacified.  2. Elongated embolus within the left M1 and proximal M2 vessels. 3. Negative for dissection or interval ulceration in the arch and cervical carotids. 4. Carotid bifurcation atherosclerosis with approximately 50-60% left ICA bulb narrowing. Visually the stenosis appears worse due to positive remodeling at the low-density plaque. 5. Moderate atheromatous narrowing of the right V4 and mid basilar.  CT Head WO Contrast 06/03/2017 IMPRESSION: 1. Several small foci of enhancement within bilateral parietal and left frontal cortex as well as left basal ganglia probably representing enhancing infarctions. 2. No large hemorrhage, focal mass effect, herniation, or hydrocephalus.  DG Chest Port 1 View 06/03/2017 IMPRESSION: 1. The ETT terminates 1.5 cm above the carina. Recommend withdrawing 1 cm. 2. New infiltrate in the lateral left lung base is nonspecific.  Recommend attention on follow-up.  DSA 06/03/2017 S/P bilateral common carotid arteriogram,and Rt vert angiogram,followed by complete revascularization of occluded  MCAs M1 segments bilaterally, with x 1 pass with embotrap 103mmx 35 mm  Retrieval device achieving a TICI3 reperfusion bilaterally.  EEG 06/03/2017 This EEG is abnormal and findings are suggestive of mild generalized cerebral dysfunction. Intermittent arm twitching was not associated with any epileptiform activity.  MRI Brain Wo Contrast 06/04/2017 IMPRESSION: 1. 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 No hemorrhagic conversion. 2. Moderate chronic small vessel ischemic disease.    PHYSICAL EXAM  Temp:  [97.5 F (36.4  C)-98 F (36.7 C)] 98 F (36.7 C) (09/05 1551) Pulse Rate:  [68-88] 81 (09/05 1702) Resp:  [16-22] 19 (09/05 1702) BP: (122-151)/(61-68) 122/61 (09/05 1551) SpO2:  [98 %-100 %] 100 % (09/05 1702) FiO2 (%):  [28 %] 28 % (09/05 1702) Weight:  [164 lb 3.9 oz (74.5 kg)] 164 lb 3.9 oz (74.5 kg) (09/05 0414)  General - pleasant middle aged Caucasian lady, well developed, s/p tracheostomy  Ophthalmologic - Fundi not visualized.  Cardiovascular - Regular rate and rhythm.  Neuro -s/p tracheostomy.. Awake alert with mild apahsia and following simple peripheral and central commands. Eyes moving both direction on commands, but not complete. PERRL, blinking to visual threat bilaterally and tracking.  . Mild left upper extremity weakness 3/5  . LLE 4/5 at least, RLE 4-/5. DTR 1+ and no babinski. Sensation, coordination and gait not tested.   ASSESSMENT/PLAN Ms. [name] is a 66 y.o. female with history of HTN, HLD, pseudomotor cerebri, DM  presenting with headache and weakness of right hand. She did not receive IV t-PA due to out of window. CTA head and neck showed left ICA 65% stenosis. Dr. Bridgett Larsson consulted. During cerebral angiogram this morning, patient had acute mental status decline, became unresponsive. Code stroke called.  Found to have M1 thrombus, status post mechanical thrombectomy. MRI 06/04/2017 with numerous acute new supra- and infratentorial infarcts spanning multiple vascular territories consistent with embolic phenomena.   Stroke -  bilateral MCA acute thrombosis, in aug 2018 likely related to cerebral angiogram procedure treated with bilateral mechanical embolectomy with embotrap device with excellent revascularization. New complains of vomiting and nausea of unclear etiology unlikely related to a new stroke. Abnormal CT scan raising concern for left M3 clot will follow this with a CT angiogram as patient is not cooperative for MRI  Acute mental status decline during procedure  CT  negative for acute hemorrhage or infarct  CTA head and neck showed bilateral MCA acute thrombosis  S/p mechanical thrombectomy with bilateral TICI3 reperfusion  EEG with no seizure activity  MRI showed bilateral supra-and infratentorial infarcts, consistent with cardioembolic phenomena  Continue DAPT and supportive care   Clinically much improved  Wean off vent as able  Recent stroke: acute and subacute small pathcy infarcts in the left MCA distribution, possibly artery to artery embolic due to left ICA stenosis.     Resultant  Right hand subtle weakness  MRI head Multiple small areas of acute infarct in the left parietal cortex and left occipital cortex. Numerous chronic white matter infarcts bilaterally  CTA head and neck - Large amount of noncalcified plaque at the proximal left internal carotid artery, causing 60-65% stenosis.   2D Echo  EF 60-65%  CUS - left ICA 40-59% stenosis  Cerebral angio < 50% left ICA stenosis  LDL 211  HgbA1c 9.5  SCDs for VTE prophylaxis Diet NPO time specified  aspirin 81 mg daily prior to admission, now on aspirin 325 mg daily and clopidogrel 75 mg daily.    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 self-reports leg numbness, chest pain, severe headaches with statins  on multiple occassions with multiple providers  Recommend follow up with PCP Dr. Felipa Eth for consideration of PCSK9 inhibitor. Medication name given to pt.  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 Jaynee Eagles at [facility] day # 5 Plan :  Check CT angiogram of the brain to evaluate the CT scan abnormality but I doubt this represents a fresh clot as patient does not have clinical symptoms to suggest a new stroke. I suspect the vomiting and nausea as not of central neurological etiology and would recommend medical evaluation for the same. Long discussion with the patient and daughter at the bedside and answered questions. Discussed with rehabilitation team physician assistant. Greater than 50% time during this 35 minute visit was spent in counseling and coordination of care about abnormal CT scan findings nausea vomiting and stroke and answering  questions  Antony Contras, MD Stroke Neurology 06/23/2017 6:39 PM   To contact Stroke Continuity provider, please refer to [email or web address] After hours, contact General Neurology

## 2017-06-23 NOTE — Progress Notes (Signed)
Dicussed patient with Dr.Sethi--he feels symptoms GI related as no other neurological changes noted. Recommended CTA head instead for MRI brain.   Discussed with Dr. Merrell--recommended GI consult and antiemetics/PPI and monitoring for improvement. If symptoms do not improve in 24 hours transfer to acute.  [facility] consulted for input.

## 2017-06-24 ENCOUNTER — Inpatient Hospital Stay (HOSPITAL_COMMUNITY): Payer: Medicare Other | Admitting: Physical Therapy

## 2017-06-24 ENCOUNTER — Inpatient Hospital Stay (HOSPITAL_COMMUNITY): Payer: Medicare Other | Admitting: Occupational Therapy

## 2017-06-24 ENCOUNTER — Inpatient Hospital Stay (HOSPITAL_COMMUNITY): Payer: Medicare Other | Admitting: Speech Pathology

## 2017-06-24 DIAGNOSIS — K920 Hematemesis: Secondary | ICD-10-CM

## 2017-06-24 DIAGNOSIS — R112 Nausea with vomiting, unspecified: Secondary | ICD-10-CM

## 2017-06-24 DIAGNOSIS — R140 Abdominal distension (gaseous): Secondary | ICD-10-CM

## 2017-06-24 LAB — BASIC METABOLIC PANEL
ANION GAP: 9 (ref 5–15)
BUN: 26 mg/dL — ABNORMAL HIGH (ref 6–20)
CALCIUM: 9.1 mg/dL (ref 8.9–10.3)
CO2: 20 mmol/L — AB (ref 22–32)
Chloride: 106 mmol/L (ref 101–111)
Creatinine, Ser: 0.92 mg/dL (ref 0.44–1.00)
GFR calc Af Amer: >60 mL/min (ref >60)
GFR calc non Af Amer: >60 mL/min (ref >60)
GLUCOSE: 197 mg/dL — AB (ref 65–99)
Potassium: 3.8 mmol/L (ref 3.5–5.1)
Sodium: 135 mmol/L (ref 135–145)

## 2017-06-24 LAB — GLUCOSE, CAPILLARY
GLUCOSE-CAPILLARY: 201 mg/dL — AB (ref 65–99)
GLUCOSE-CAPILLARY: 191 mg/dL — AB (ref 65–99)
GLUCOSE-CAPILLARY: 98 mg/dL (ref 65–99)
Glucose-Capillary: 186 mg/dL — ABNORMAL HIGH (ref 65–99)
Glucose-Capillary: 125 mg/dL — ABNORMAL HIGH (ref 65–99)
Glucose-Capillary: 204 mg/dL — ABNORMAL HIGH (ref 65–99)

## 2017-06-24 LAB — CBC
HCT: 30.5 % — ABNORMAL LOW (ref 36.0–46.0)
HEMOGLOBIN: 9.8 g/dL — AB (ref 12.0–15.0)
MCH: 28.6 pg (ref 26.0–34.0)
MCHC: 32.1 g/dL (ref 30.0–36.0)
MCV: 88.9 fL (ref 78.0–100.0)
Platelets: 393 10*3/uL (ref 150–400)
RBC: 3.43 MIL/uL — AB (ref 3.87–5.11)
RDW: 15.2 % (ref 11.5–15.5)
WBC: 9.4 10*3/uL (ref 4.0–10.5)

## 2017-06-24 MED ORDER — INSULIN GLARGINE 100 UNIT/ML ~~LOC~~ SOLN
25.0000 [IU] | Freq: Every day | SUBCUTANEOUS | Status: DC
  Administered 2017-06-25 – 2017-06-27 (×3): 25 [IU] via SUBCUTANEOUS
  Filled 2017-06-24 (×4): qty 0.25

## 2017-06-24 MED ORDER — VITAL 1.5 CAL PO LIQD
1000.0000 mL | ORAL | Status: DC
  Administered 2017-06-24: 1000 mL
  Filled 2017-06-24 (×4): qty 1000

## 2017-06-24 MED ORDER — TOPIRAMATE 25 MG PO TABS
50.0000 mg | ORAL_TABLET | Freq: Two times a day (BID) | ORAL | Status: DC
  Administered 2017-06-24 – 2017-06-29 (×9): 50 mg via ORAL
  Filled 2017-06-24 (×10): qty 2

## 2017-06-24 MED ORDER — CLOPIDOGREL BISULFATE 75 MG PO TABS: 75.0000 mg | ORAL_TABLET | Freq: Every day | ORAL | Status: DC

## 2017-06-24 NOTE — Progress Notes (Signed)
Occupational Therapy Weekly Progress Note  Patient Details  Name: [name] MRN: [id number] Date of Birth: 02/02/1951  Beginning of progress report period: June 19, 2017 End of progress report period: June 25, 2017  Patient has met 1 of 3 short term goals.  Pt has been very limited in her participation this reporting period due to constant complaints of nausea and episodes of vomiting. She requires max encouragement for participation in all therapy sessions. She can complete bed mobility with min A, however, requires max A with +2 for safety for squat pivot transfer.  Pt has not tolerated/ is unsafe this past week to complete toilet transfers. LB dressing has been done total A from bed level due to pt requesting/ requiring bed level activity most of reporting period. STG remain appropriate for next reporting period as at end of week pt appears with more energy and nausea/ vomiting subsiding. Pt's daughter has been present for OT sessions however, have no completed any hands on training at this time.   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  Patient progressing toward long term goals..  Continue plan of care. At this time pt's goals remain appropriate, however may need to downgrade pending pt participation and medical stability in the upcoming reporting period.   OT Short Term Goals Week 1:  OT Short Term Goal 1 (Week 1): Pt will perform toilet transfer with max A. OT Short Term Goal 1 - Progress (Week 1): Not met OT Short Term Goal 2 (Week 1): Pt will perform LB dressing with max A in order to decrease level of assistance with self care.  OT Short Term Goal 2 - Progress (Week 1): Not met OT Short Term Goal 3 (Week 1): Pt will  perform UB dressing with mod A and utilizing modifications as needed.  OT Short Term Goal 3 - Progress (Week 1): Met Week 2:  OT Short Term Goal 1 (Week 2): Pt will complete toilet transfer with max A in order to decrease burden of care OT Short Term Goal 2 (Week 2): Pt will recall and utilize hemi dressing technique with 1 VC OT Short Term Goal 3 (Week 2): Pt will complete sit <> stand at sink with max A in prep for LB     Therapy Documentation Precautions:  Precautions Precautions: Fall Precaution Comments: cortrak, trach Restrictions Weight Bearing Restrictions: No  See Function Navigator for Current Functional Status.   Bobby Rumpf, Ovida Delagarza C 06/24/2017, 3:48 PM

## 2017-06-24 NOTE — Progress Notes (Signed)
Orthopedic Tech Progress Note Patient Details:  [name] 01-27-1951 [id number]  Patient ID: [name], female   DOB: 31-Jul-1951, 66 y.o.   MRN: [id number]   [name] 06/24/2017, 11:44 AM Called in advanced brace order; spoke with [facility]

## 2017-06-24 NOTE — Progress Notes (Signed)
Physical Therapy Session Note  Patient Details  Name: [name] MRN: [id number] Date of Birth: Jun 12, 1951  Today's Date: 06/24/2017 PT Individual Time: 1300-1325 PT Individual Time Calculation (min): 25 min   Short Term Goals: Week 1:  PT Short Term Goal 1 (Week 1): Pt will transfer with max assist PT Short Term Goal 2 (Week 1): Pt will initiate gait training with PT PT Short Term Goal 3 (Week 1): Pt will tolerate out of bed in w/c x2 hours outside of therapy time.   Skilled Therapeutic Interventions/Progress Updates:  Pt received supine in bed and agreeable to PT. Supine>sit transfer with max assist and max cues for participations.   Pt very fearful of movement and nervous of getting dizzy when sitting EOB. Sitting balance and scooting to EOB x 15 minutes with min-supervision assist from PT.  PT attempted to instructed pt in lateral scooting EOB but Leg length prevented use of LE to assist with scooting. Pt then reports feeling "woozy" like she is going to pass out and return to supine with max assist. Pt required repositioning in supine and reports feeling pain in chest following by sanguine sputum with cough.  Max assist to return to sitting EOB an decreased chest pain and coughing. Total assist lateral scoot to [facility] with use of chuck pad to move pt. Returned to semirecumbent with max assist. Pt left in bed with call bell in reach and all needs met.          Therapy Documentation Precautions:  Precautions Precautions: Fall Precaution Comments: cortrak, trach Restrictions Weight Bearing Restrictions: No Vital Signs: Therapy Vitals Temp: 98 F (36.7 C) Temp Source: Oral Pulse Rate: 93 Resp: 18 BP: 130/72 Patient Position (if appropriate): Sitting Oxygen Therapy SpO2: 100 % O2 Device: Tracheostomy Collar O2 Flow Rate (L/min): 5 L/min FiO2 (%): 28 % Pain:   Faces: a little hurt.   See Function Navigator for Current Functional Status.   Therapy/Group: Individual  Therapy  Lorie Phenix 06/24/2017, 4:40 PM

## 2017-06-24 NOTE — Progress Notes (Signed)
Patient would like to get her trach and cortak out and go home. Discussed need for nutritional support--she is willing to have PEG placed. Discussed with Dr. Therisa Doyne who was present for part of conversation. She recommends IVR placing PEG. Will order consult--hold plavix. Resume ASA and tube feeds.   We also discussed need to keep trach in for now--she needs to have improvement in endurance levels as well as stability of medical issues. Informed her that we will try to downsize to CFS # 4 next week. Patient very upset with news and started yelling at her husband--hard to redirect. Informed patient and family that we would discuss this again tomorrow.

## 2017-06-24 NOTE — Progress Notes (Signed)
Occupational Therapy Session Note  Patient Details  Name: [name] MRN: [id number] Date of Birth: [birth year]  Today's Date: 06/24/2017 OT Individual Time: 0830-0900 and 1100-1200 OT Individual Time Calculation (min): 30 min (make up time) and 60 min   Short Term Goals: Week 1:  OT Short Term Goal 1 (Week 1): Pt will perform toilet transfer with max A. OT Short Term Goal 2 (Week 1): Pt will perform LB dressing with max A in order to decrease level of assistance with self care.  OT Short Term Goal 3 (Week 1): Pt will perform UB dressing with mod A and utilizing modifications as needed.   Skilled Therapeutic Interventions/Progress Updates:    Session One: Pt seen for OT make up session focusing on bed mobility and activity tolerance. Pt in supine upon arrival with RN present administering morning meds.  Pt required max encouragement for participation and use of PMSV however, when given options for activity pt responded much better. SHe opted to focus on rolling for donning pants. Able to request need for use of bed pan, rolled with min A using hospital bed functions for bed pan to be placed and to have pants pulled up.  She toelrated PMSV well throughout session, and O2 remaining >98% in supplemental O2 via trach collar.  Pt left in supine at end of session with MD and daughter present, all needs in reach.  Education provided throughout session regarding CVA recovery, POC, benefits of mobility/ OOB tolerance, and d/c planning.   Session Two: Pt seen for OT session focusing on ADL re-training, OOB tolerance, and functional sitting balance/ endurance. Pt in supine upon arrival with hand off from PT. With encouragement, pt agreeable to tx session.  She transferred to EOB with max A and use of hospital bed functions. Pt able to sit EOB with close supervision, however, requiring up to mod A when fatigued. She completed squat pivot transfer into w/c with max A and +2 for safety.  She completed  UB bathing/dressing from w/c level at sink, requiring encouragement for independence with self care and max cuing for technique to don shirt. Hand over hand assist provided for bathing B UEs as pt with decreased ROM and strength bilaterally and complaints of discomfort due to severe bruising.  Completed oral via suction toothbrush and assist for thoroughness, education provided regarding importance of oral hygiene when NPO. Pt encouraged to stay sitting up in w/c at lease one hour in order to increase OOB tolerance. Pt's daughter, NT and RN made aware of position and recommendation.    Therapy Documentation Precautions:  Precautions Precautions: Fall Precaution Comments: cortrak, trach Restrictions Weight Bearing Restrictions: No Pain:    Voiced generalized pain all over, RN was administering meds upon arrival.   See Function Navigator for Current Functional Status.   Therapy/Group: Individual Therapy  Lewis, Cordelro Gautreau C 06/24/2017, 7:15 AM

## 2017-06-24 NOTE — Progress Notes (Signed)
Speech Language Pathology Daily Session Note  Patient Details  Name: [name] MRN: [id number] Date of Birth: [birth year]  Today's Date: 06/24/2017 SLP Individual Time: 0930-1030 SLP Individual Time Calculation (min): 60 min  Short Term Goals: Week 1: SLP Short Term Goal 1 (Week 1): Pt will tolerate PMSV without decline in vitals during all waking hours.  SLP Short Term Goal 2 (Week 1): Pt will consume trials of honey thick via spoon and puree with minimal overt s/s of aspiration to demonstrate readiness for repeat instrumental swallow study. SLP Short Term Goal 3 (Week 1): Pt will increase vocal intensity with Mod A cues to achieve ~ 90% intelligiblity at the phrase level.  SLP Short Term Goal 4 (Week 1): Pt will increase sustained attention to task for ~ 20 minutes with Mod A cues.  SLP Short Term Goal 5 (Week 1): Pt will demonstrate basic problem solving for familiar tasks with Mod A cues.   Skilled Therapeutic Interventions: Skilled treatment session focused on cognition goals. SLP received pt in reclined position in bed, audible secretions at trach hub, PMSV off, daughter present and pt overall poor disposition to therapist's presence. SLP facilitated session by suctioning secretions at trach hub and placing PMSV. Max encouragement and education provided to pt on need to wear PMSV and participate in therapy. Pt able to rally and increase participation. Pt with intermittent cough but able to direct secretions to oral and then swallowed instead of spitting. Given initial hand over hand support, pt able to complete oral care with suction toothbrush. Pt able to initiate 5 swallows on request. Trials of PO not given d/t pt with recent nausea and SLP felt pt was use consumption as excuse not to participate. Overall, pt required Max A reminders to increase vocal intensity to achieve ~ 50% intelligibility at the phrase level. Pt required Mod A cues to sort pegs by single color. Pt able to sort  colors when also presented to her right.   Extensive education provided to pt and daughter that pt must wear PMSV as long as tolerated without any decline in vitals with full supervision. Education provided to pt that refusing to wear was not beneficial towards goals of decanulation.      Function:    Cognition Comprehension Comprehension assist level: Understands basic 50 - 74% of the time/ requires cueing 25 - 49% of the time;Understands basic 25 - 49% of the time/ requires cueing 50 - 75% of the time  Expression Expression assistive device: Talk trach valve Expression assist level: Expresses basic 25 - 49% of the time/requires cueing 50 - 75% of the time. Uses single words/gestures.  Social Interaction Social Interaction assist level: Interacts appropriately 25 - 49% of time - Needs frequent redirection.  Problem Solving Problem solving assist level: Solves basic 25 - 49% of the time - needs direction more than half the time to initiate, plan or complete simple activities  Memory Memory assist level: Recognizes or recalls less than 25% of the time/requires cueing greater than 75% of the time;Recognizes or recalls 25 - 49% of the time/requires cueing 50 - 75% of the time    Pain    Therapy/Group: Individual Therapy  Arnav Cregg 06/24/2017, 11:22 AM

## 2017-06-24 NOTE — Progress Notes (Signed)
The patient was seen at bedside. She has not had any melena or hematochezia. She has had minimal secretions with small amount of bright red blood-tinged mucus.  At this point, okay to resume antiplatelet if needed. Hemoglobin yesterday was 9.9 and today is 9.8. Remains hemodynamically stable.  Recommend PPI twice a day for one week, and then once daily as long as patient is on antiplatelets(likely indefinitely). Patient may have NG tube induced trauma or stress gastritis/ulcer from stroke.  Patient will need a feeding tube placement via IR guided assistance.  Will sign off from GI standpoint. Please recall if needed.  Ronnette Juniper, M.D. [phone]

## 2017-06-24 NOTE — Progress Notes (Signed)
[facility]IC[facility] RE[facility]ILITATION     PROGRESS NOTE  Subjective/Complaints:   Vomiting. Overall reduced, although had an episode last night, had some blood with it. Appreciate GI note, appreciate IM note, appreciate neuro note, discussed results of consultations with patient and her daughter. No new stroke, nausea, vomiting without obvious cause, monitoring hemoglobin and hematocrit given anticoagulation ROS: limited  Objective:  Vital Signs: Blood pressure 122/61, pulse 80, temperature 98 F (36.7 C), temperature source Oral, resp. rate 20, height 5\' 4"  (1.626 m), weight 74.5 kg (164 lb 3.9 oz), SpO2 100 %. Ct Angio Head W Or Wo Contrast  Result Date: 06/23/2017 CLINICAL DATA:  Worsening nausea and vomiting beginning this morning. Assess for recurrent stroke. Status post endovascular revascularization RIGHT middle cerebral artery occlusion. History of seizures, shunt placed for pseudotumor cerebri, hypertension. EXAM: CT ANGIOGRAPHY HEAD TECHNIQUE: Multidetector CT imaging of the head was performed using the standard protocol during bolus administration of intravenous contrast. Multiplanar CT image reconstructions and MIPs were obtained to evaluate the vascular anatomy. CONTRAST:  50 cc Isovue 370 COMPARISON:  MRI of the head June 04, 2017 and CT HEAD June 23, 2017 at 1111 hours FINDINGS: CT HEAD BRAIN: No intraparenchymal hemorrhage, mass effect nor midline shift. The ventricles and sulci are normal for age. Patchy supratentorial white matter hypodensities within normal range for patient's age, though non-specific are most compatible with chronic small vessel ischemic disease. Multiple small cortical infarcts better demonstrated on prior MRI. No acute large vascular territory infarcts. No abnormal extra-axial fluid collections. Basal cisterns are patent. VASCULAR: Mild calcific atherosclerosis of the carotid siphons. SKULL: No skull fracture. No significant scalp soft tissue  swelling. SINUSES/ORBITS: Nasogastric tube via LEFT nares. LEFT maxillary and RIGHT sphenoid mucosal thickening. Mastoid air cells are well aerated. The included ocular globes and orbital contents are non-suspicious. Soft post bilateral ocular lens implants. OTHER: None. CTA HEAD ANTERIOR CIRCULATION: Patent cervical internal carotid arteries, petrous, cavernous and supra clinoid internal carotid arteries. Widely patent anterior communicating artery. Prominent LEFT posterior communicating artery origin infundibulum. Patent anterior and middle cerebral arteries, mild-to-moderate luminal irregularity. No large vessel occlusion, significant stenosis, contrast extravasation or aneurysm. POSTERIOR CIRCULATION: LEFT vertebral artery is dominant. Patent vertebral arteries, vertebrobasilar junction and basilar artery, as well as main branch vessels. Mild tandem stenosis basilar artery. Patent posterior cerebral arteries, mild to moderate luminal irregularity. Fetal origin RIGHT posterior cerebral artery. Severe stenosis LEFT P3 origin. No large vessel occlusion, contrast extravasation or aneurysm. VENOUS SINUSES: Major dural venous sinuses are patent though not tailored for evaluation on this angiographic examination. ANATOMIC VARIANTS: None. DELAYED PHASE: Mild LEFT LEFT parietal and bifrontal cortical enhancement corresponding to areas of prior infarction. MIP images reviewed. IMPRESSION: CT HEAD: 1. No acute intracranial process. 2. Multiple subacute enhancing infarcts. 3. Moderate chronic small vessel ischemic disease. CTA HEAD: 1. No emergent large vessel occlusion with particular attention to LEFT M2 segment. 2. Mild to moderate intracranial atherosclerosis. Severe stenosis LEFT P3 origin. Electronically Signed   By: Elon Alas M.D.   On: 06/23/2017 20:13   Ct Head Wo Contrast  Result Date: 06/23/2017 CLINICAL DATA:  Right frontal headache with nausea and vomiting EXAM: CT HEAD WITHOUT CONTRAST TECHNIQUE:  Contiguous axial images were obtained from the base of the skull through the vertex without intravenous contrast. COMPARISON:  Head CT June 03, 2017 and brain MRI June 04, 2017 FINDINGS: Brain: There is slight diffuse atrophy. There is no demonstrable intracranial mass, hemorrhage, extra-axial fluid collection, or  midline shift. There is patchy small vessel disease throughout the centra semiovale bilaterally. No focal vascular distribution type infarct is evident. The previously noted multiple small infarcts on diffusion imaging are not delineated on this noncontrast enhanced CT examination. No acute infarct is demonstrated on this study currently. Vascular: There is increased attenuation in the M2 segment of the left middle cerebral artery, a finding that may represent early hyperdense vessel. No similar findings elsewhere. There is calcification in each carotid siphon. There is also calcification in the distal left vertebral artery. Skull: Bony calvarium appears intact. Sinuses/Orbits: There is opacification throughout much of the inferior left maxillary antrum with what appear to be retention cysts in this area. There is mucosal thickening in several ethmoid air cells. There are apparent retention cysts in the posterior right sphenoid sinus. Other paranasal sinuses appear clear. Orbits appear symmetric bilaterally. Other: Mastoid air cells are clear. IMPRESSION: 1. Question early hyperdense vessel in the periphery of the left middle cerebral artery. Given concern for hyperdense vessel in this area and potential for acute infarct, correlation with brain MRI including diffusion imaging is warranted at this time. 2. Mild atrophy with patchy supratentorial small vessel disease. No well-defined acute infarct evident. The multiple small infarcts noted on recent MR are not well delineated on noncontrast enhanced CT. 3.  No foci of hemorrhage.  No extra-axial fluid collection. 4.  Foci of arterial vascular  calcification noted. 5.  Areas of paranasal sinus disease. These results were called by telephone at the time of interpretation on 06/23/2017 at 11:29 am to Reesa Chew, [place] ,who verbally acknowledged these results. Electronically Signed   By: Lowella Grip III M.D.   On: 06/23/2017 11:29   Dg Chest Port 1 View  Result Date: 06/23/2017 CLINICAL DATA:  Cough, congestion, bloody mucus EXAM: PORTABLE CHEST 1 VIEW COMPARISON:  06/14/2017 FINDINGS: Minimal opacity at the left lung base with blunting of the left costophrenic angle, likely reflecting a small left pleural effusion with left basilar atelectasis. Right lung is clear. No pneumothorax. The heart is normal in size.  Tracheostomy in satisfactory position. Right arm PICC terminates at the cavoatrial junction. Enteric tube courses into the stomach. IMPRESSION: Small left pleural effusion with left basilar atelectasis. Tracheostomy in satisfactory position. Additional support apparatus as above. Electronically Signed   By: Julian Hy M.D.   On: 06/23/2017 14:03   Dg Abd 2 Views  Result Date: 06/23/2017 CLINICAL DATA:  Abdominal distention with vomiting EXAM: ABDOMEN - 2 VIEW COMPARISON:  June 14, 2017 FINDINGS: Supine and upright images were obtained. Feeding tube tip is at the level of the fourth portion of the duodenum. There is contrast in the colon. There is no bowel dilatation or air-fluid level to suggest bowel obstruction. No evident free air. Visualized lung bases are clear. IMPRESSION: No demonstrable bowel obstruction or free air. Feeding tube tip in fourth portion of duodenum. Electronically Signed   By: Lowella Grip III M.D.   On: 06/23/2017 11:50    Recent Labs  06/23/17 1258 06/24/17 0532  WBC 10.5 9.4  HGB 9.9* 9.8*  HCT 31.3* 30.5*  PLT 419* 393    Recent Labs  06/23/17 1258 06/24/17 0532  NA 135 135  K 4.1 3.8  CL 103 106  GLUCOSE 191* 197*  BUN 30* 26*  CREATININE 0.82 0.92  CALCIUM 9.1 9.1   CBG (last  3)   Recent Labs  06/23/17 2125 06/23/17 2349 06/24/17 0430  GLUCAP 144* 198* 186*  Wt Readings from Last 3 Encounters:  06/23/17 74.5 kg (164 lb 3.9 oz)  06/18/17 76.4 kg (168 lb 6.9 oz)  01/15/16 79.3 kg (174 lb 12.8 oz)    Physical Exam:  BP 122/61 (BP Location: Left Arm)   Pulse 80   Temp 98 F (36.7 C) (Oral)   Resp 20   Ht 5\' 4"  (1.626 m)   Wt 74.5 kg (164 lb 3.9 oz)   SpO2 100%   BMI 28.19 kg/m  Constitutional: She appears well-developed and well-nourished.  HENT: Normocephalic and atraumatic.  Eyes: EOM are normal. No discharge Neck: CFS #6 without drainage today Cardiovascular: RRR. No JVD Respiratory: normal effort. Fair flow, rhonchi heard GI: distended , reduced BS, NT  +tympany Musculoskeletal: She exhibits no edema or tenderness.  Neurological: Alert  Left facial droop with dysarthric speech.  Right sided weakness with decrease in motor control.  Right homonymous hemianopsia RUE grossly 3- to 3/5.Marland Kitchen  RLE grossly 4-/5.  Skin: Skin is warm and dry. She is not diaphoretic.  Psychiatric: affect flat, processing delayed Assessment/Plan: 1. 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 Physiatrist is providing close team supervision and 24 hour management of active medical problems listed below. Physiatrist and rehab team continue to assess barriers to discharge/monitor patient progress toward functional and medical goals.  Function:  Bathing Bathing position   Position: Bed  Bathing parts Body parts bathed by patient: Chest, Abdomen, Right upper leg Body parts bathed by helper: Right arm, Left arm, Left upper leg, Right lower leg, Left lower leg, Back  Bathing assist Assist Level:  (MaxA )      Upper Body Dressing/Undressing Upper body dressing   What is the patient wearing?: Pull over  shirt/dress       Pull over shirt/dress - Perfomed by helper: Thread/unthread right sleeve, Thread/unthread left sleeve, Put head through opening, Pull shirt over trunk        Upper body assist Assist Level:  (MaxA )   Set up : To obtain clothing/put away  Lower Body Dressing/Undressing Lower body dressing   What is the patient wearing?: Pants, Non-skid slipper socks       Pants- Performed by helper: Thread/unthread right pants leg, Thread/unthread left pants leg, Pull pants up/down   Non-skid slipper socks- Performed by helper: Don/doff right sock, Don/doff left sock                  Lower body assist Assist for lower body dressing:  (MaxA )      Toileting Toileting Toileting activity did not occur: No continent bowel/bladder event        Toileting assist     Transfers Chair/bed transfer   Chair/bed transfer method: Stand pivot Chair/bed transfer assist level: Maximal assist (Pt 25 - 49%/lift and lower) Chair/bed transfer assistive device: Armrests     Locomotion Ambulation Ambulation activity did not occur: Safety/medical concerns         Wheelchair          Cognition Comprehension Comprehension assist level: Understands basic 50 - 74% of the time/ requires cueing 25 - 49% of the time  Expression Expression assist level: Expresses basic 50 - 74% of the time/requires cueing 25 - 49% of the time. Needs to repeat parts of sentences.  Social Interaction Social Interaction assist level: Interacts appropriately 25 - 49% of time - Needs  frequent redirection.  Problem Solving Problem solving assist level: Solves basic 25 - 49% of the time - needs direction more than half the time to initiate, plan or complete simple activities  Memory Memory assist level: Recognizes or recalls 25 - 49% of the time/requires cueing 50 - 75% of the time    Medical Problem List and Plan:  1. 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 DVT Prophylaxis/Anticoagulation: Pharmaceutical: Lovenox  3. Headaches/Pain Management:   Topamax started 9/1--pt with persistent headaches   -increase to 50mg  and change to evening dosing 4. Mood: LCSW to follow for evaluation and support.  5. Neuropsych: This patient is not capable of making decisions on her own behalf.  6. Skin/Wound Care: Routine pressure relief measures.  7. Fluids/Electrolytes/Nutrition: Monitor I/Os  . Discontinue IV fluids, restart tube feeds. Monitor for increased vomiting 8. MSSA PNA: Completed treatment 7 day course of cefazolin on 8/30. Continue to monitor with strict aspiration precautions--HOB > 30 degrees at all times.  9. VDRF: Tolerating PMSV with supervision. Secretions felt to be in part due to GERD.   -continue #6 trach until secretion mgt/swallow better 10 HTN: Monitor BP bid--on lopressor bid and catapres Vitals:   06/24/17 0035 06/24/17 0439  BP:    Pulse: 76 80  Resp: 20 20  Temp:    SpO2: 100% 100%    bp controlled 9/5 11. ABLA: Improved with transfusion.   Hemoglobin 9.4 on 9/1  Continue to monitor 12. Anxiety disorder:Team to provide ego support. Daughter staying to encourage patient. Palliative following for support and placed on IV ativan tid for symptoms management--decrease to prn due to sedation.  13. T2DM: Hgb A1C-  - CBG (last 3)   Recent Labs  06/23/17 2125 06/23/17 2349 06/24/17 0430  GLUCAP 144* 198* 186*  Fair control   -increase lantus to 30 u daily  -continue scheduled novolog 14. 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 Leucocytosis: persistent .Monitor for signs of infection.   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 B-MCA  stenosis/L-ICA stenosis s/p revascularization: On ASA/Plavix.  19. Acute urinary retention:   Foley out  Ua+, ucx with multispecies- Resent   20.  Vomiting with distended abd, Negative KUB LOS (Days) 6 A FACE TO FACE EVALUATION WAS PERFORMED  [name] 06/24/2017 9:09 AM

## 2017-06-24 NOTE — Progress Notes (Signed)
Physical Therapy Session Note  Patient Details  Name: [name] MRN: [id number] Date of Birth: 04/25/1951  Today's Date: 06/24/2017 PT Individual Time: 1032-1107 PT Individual Time Calculation (min): 35 min   Short Term Goals: Week 1:  PT Short Term Goal 1 (Week 1): Pt will transfer with max assist PT Short Term Goal 2 (Week 1): Pt will initiate gait training with PT PT Short Term Goal 3 (Week 1): Pt will tolerate out of bed in w/c x2 hours outside of therapy time.   Skilled Therapeutic Interventions/Progress Updates:  Pt received in bed with daughter present in room. Pt reluctantly agreeable to tx with significant encouragement from daughter. Pt performed BLE strengthening exercises including ankle pumps, hip adduction pillow squeezes, and short arc quads with therapist providing instructional cuing for technique. Notified PA ([name]) that pt may benefit from [facility] boots at night as pt maintains B plantarflexion when lying in bed. Pt reported need to use bathroom and required total assist for clothing & brief management. Pt with incontinent void and additional continent void on bed pan. Pt requires total assist to don clean brief and gown and +2 assist to scoot to [facility]. At end of session pt left in bed with NT & OT entering room.  Therapy Documentation Precautions:  Precautions Precautions: Fall Precaution Comments: cortrak, trach Restrictions Weight Bearing Restrictions: No   See Function Navigator for Current Functional Status.   Therapy/Group: Individual Therapy  [name] 06/24/2017, 12:18 PM

## 2017-06-24 NOTE — Progress Notes (Signed)
Physical Therapy Note  Patient Details  Name: NIKEIA HENKES MRN: [id number] Date of Birth: [birth year] Today's Date: 06/24/2017    Time: 1300-1325 25 minutes make up time  1:1 Pt with no c/o pain. Pt agreeable to treatment with encouragement from PT, RN and family.  Pt performed supine to sit with mod A for trunk and LE control.  Sit to stand in stedy with max A.  7 x sit to stand from stedy with min A to stand, pt able to stand 30 seconds each attempt.  Pt requires max A for sit to supine and total A to scoot up in bed.  Pt left with needs at hand and family present.   Rahmel Nedved 06/24/2017, 1:32 PM

## 2017-06-24 NOTE — Progress Notes (Signed)
PROGRESS NOTE    [name]  VZC:[id number] DOB: 13-Aug-1951 DOA: 06/18/2017 PCP: [name], MD   No chief complaint on file.    Brief Narrative:  Consulted for nausea/vomiting, hematemesis versus hemoptysis  Assessment & Plan   CVA with right-sided heme paresis -Question of possible reinfarction on MRI however CTA head and neck obtained -Neurology following -Continue aspirin, Plavix  Intractable nausea/vomiting -Question etiology -Continue antiemetics as needed -Tube feeds were held, have been restarted -KUB unremarkable  Hematemesis -Hemoglobin currently stable at 9.4 -Gastroneurology consultation appreciated -Suspect secondary to possible esophageal or gastric irritation -Continue Carafate, PPI, Pepcid -Patient also currently on aspirin as well as Plavix  Questionable aspiration -Chest x-ray unremarkable, patient currently afebrile with no leukocytosis -Although aspiration is possible given tube feeds, would continue tube feeds with aspiration precautions -May consider alternate route  Pseudotumor cerebri -Continue Diamox  Headache -Continue Topamax  DVT Prophylaxis  lovenox  Code Status: Full  Family Communication: Daughter at bedside  Disposition Plan: Admitted to inpatient rehab  Consultants [facility] Neurology Gastroenterology  Procedures  None  Antibiotics   Anti-infectives    None      Subjective:   [facility] seen and examined today.  Feels nausea and vomiting have improved mildly. Currently denies chest pain or shortness of breath.  Objective:   Vitals:   06/24/17 0035 06/24/17 0439 06/24/17 1000 06/24/17 1420  BP:    130/72  Pulse: 76 80 88 79  Resp: _0 Temp:    98 F (36.7 C)  TempSrc:    Oral  SpO2: 100% 100% 99% 100%  Weight:      Height:        Intake/Output Summary (Last 24 hours) at 06/24/17 1503 Last data filed at 06/24/17 1230  Gross per 24 hour  Intake                0 ml  Output              1265 ml  Net            -1265 ml   Filed Weights   06/21/17 0536 06/22/17 0530 06/23/17 0414  Weight: 75.7 kg (166 lb 14.2 oz) 73 kg (160 lb 15 oz) 74.5 kg (164 lb 3.9 oz)    Exam  General: Well developed, well nourished, NAD, appears stated age  HEENT: NCAT, mucous membranes moist. NG tube in place   Neck: Trach   Cardiovascular: S1 S2 auscultated, 2/6 SEM, RRR  Respiratory: Clear to auscultation  Abdomen: Soft, nontender, nondistended, + bowel sounds  Extremities: warm dry without cyanosis clubbing. +LE edema B/L   Neuro: AAOx3, right upper charity weakness   Data Reviewed: I have personally reviewed following labs and imaging studies  CBC:  Recent Labs Lab 06/18/17 0459 06/19/17 1232 06/23/17 0619 06/23/17 1258 06/24/17 0532  WBC 11.3* 11.4* 14.5* 10.5 9.4  NEUTROABS  --  7.8*  --  8.6*  --   HGB 9.4* 9.4* 10.5* 9.9* 9.8*  HCT 30.3* 30.7* 33.1* 31.3* 30.5*  MCV 89.4 89.0 88.0 87.7 88.9  PLT 390 396 434* 419* 128   Basic Metabolic Panel:  Recent Labs Lab 06/18/17 0459 06/19/17 1232 06/23/17 1258 06/24/17 0532  NA 140 135 135 135  K 4.0 4.2 4.1 3.8  CL 106 104 103 106  CO2 _1 20*  GLUCOSE 155* 146* 191* 197*  BUN 17 30* 30* 26*  CREATININE 0.77 0.87 0.82 0.92  CALCIUM  8.7* 8.7* 9.1 9.1   GFR: Estimated Creatinine Clearance: 60.2 mL/min (by C-G formula based on SCr of 0.92 mg/dL). Liver Function Tests:  Recent Labs Lab 06/19/17 1232 06/23/17 1258  AST 106* 38  ALT 29 30  ALKPHOS 104 129*  BILITOT 0.3 0.5  PROT 6.0* 7.1  ALBUMIN 2.6* 3.0*   No results for input(s): LIPASE, AMYLASE in the last 168 hours. No results for input(s): AMMONIA in the last 168 hours. Coagulation Profile: No results for input(s): INR, PROTIME in the last 168 hours. Cardiac Enzymes: No results for input(s): CKTOTAL, CKMB, CKMBINDEX, TROPONINI in the last 168 hours. BNP (last 3 results) No results for input(s): PROBNP in the last 8760 hours. HbA1C: No  results for input(s): HGBA1C in the last 72 hours. CBG:  Recent Labs Lab 06/23/17 2125 06/23/17 2349 06/24/17 0430 06/24/17 0929 06/24/17 1250  GLUCAP 144* 198* 186* 201* 191*   Lipid Profile: No results for input(s): CHOL, HDL, LDLCALC, TRIG, CHOLHDL, LDLDIRECT in the last 72 hours. Thyroid Function Tests: No results for input(s): TSH, T4TOTAL, FREET4, T3FREE, THYROIDAB in the last 72 hours. Anemia Panel: No results for input(s): VITAMINB12, FOLATE, FERRITIN, TIBC, IRON, RETICCTPCT in the last 72 hours. Urine analysis:    Component Value Date/Time   COLORURINE YELLOW 06/20/2017 0642   APPEARANCEUR HAZY (A) 06/20/2017 0642   LABSPEC 1.010 06/20/2017 0642   PHURINE 6.0 06/20/2017 0642   GLUCOSEU 50 (A) 06/20/2017 0642   HGBUR LARGE (A) 06/20/2017 0642   BILIRUBINUR NEGATIVE 06/20/2017 0642   KETONESUR NEGATIVE 06/20/2017 0642   PROTEINUR NEGATIVE 06/20/2017 0642   NITRITE POSITIVE (A) 06/20/2017 0642   LEUKOCYTESUR LARGE (A) 06/20/2017 0642   Sepsis Labs: _0 (procalcitonin:4,lacticidven:4)  ) Recent Results (from the past 240 hour(s))  Urine culture     Status: Abnormal   Collection Time: 06/20/17  6:43 AM  Result Value Ref Range Status   Specimen Description URINE, CATHETERIZED  Final   Special Requests NONE  Final   Culture MULTIPLE SPECIES PRESENT, SUGGEST RECOLLECTION (A)  Final   Report Status 06/21/2017 FINAL  Final  Culture, Urine     Status: Abnormal   Collection Time: 06/21/17  5:37 PM  Result Value Ref Range Status   Specimen Description URINE, RANDOM  Final   Special Requests NONE  Final   Culture MULTIPLE SPECIES PRESENT, SUGGEST RECOLLECTION (A)  Final   Report Status 06/22/2017 FINAL  Final      Radiology Studies: Ct Angio Head W Or Wo Contrast  Result Date: 06/23/2017 CLINICAL DATA:  Worsening nausea and vomiting beginning this morning. Assess for recurrent stroke. Status post endovascular revascularization RIGHT middle cerebral artery  occlusion. History of seizures, shunt placed for pseudotumor cerebri, hypertension. EXAM: CT ANGIOGRAPHY HEAD TECHNIQUE: Multidetector CT imaging of the head was performed using the standard protocol during bolus administration of intravenous contrast. Multiplanar CT image reconstructions and MIPs were obtained to evaluate the vascular anatomy. CONTRAST:  50 cc Isovue 370 COMPARISON:  MRI of the head June 04, 2017 and CT HEAD June 23, 2017 at 1111 hours FINDINGS: CT HEAD BRAIN: No intraparenchymal hemorrhage, mass effect nor midline shift. The ventricles and sulci are normal for age. Patchy supratentorial white matter hypodensities within normal range for patient's age, though non-specific are most compatible with chronic small vessel ischemic disease. Multiple small cortical infarcts better demonstrated on prior MRI. No acute large vascular territory infarcts. No abnormal extra-axial fluid collections. Basal cisterns are patent. VASCULAR: Mild calcific atherosclerosis of the carotid siphons.  SKULL: No skull fracture. No significant scalp soft tissue swelling. SINUSES/ORBITS: Nasogastric tube via LEFT nares. LEFT maxillary and RIGHT sphenoid mucosal thickening. Mastoid air cells are well aerated. The included ocular globes and orbital contents are non-suspicious. Soft post bilateral ocular lens implants. OTHER: None. CTA HEAD ANTERIOR CIRCULATION: Patent cervical internal carotid arteries, petrous, cavernous and supra clinoid internal carotid arteries. Widely patent anterior communicating artery. Prominent LEFT posterior communicating artery origin infundibulum. Patent anterior and middle cerebral arteries, mild-to-moderate luminal irregularity. No large vessel occlusion, significant stenosis, contrast extravasation or aneurysm. POSTERIOR CIRCULATION: LEFT vertebral artery is dominant. Patent vertebral arteries, vertebrobasilar junction and basilar artery, as well as main branch vessels. Mild tandem stenosis  basilar artery. Patent posterior cerebral arteries, mild to moderate luminal irregularity. Fetal origin RIGHT posterior cerebral artery. Severe stenosis LEFT P3 origin. No large vessel occlusion, contrast extravasation or aneurysm. VENOUS SINUSES: Major dural venous sinuses are patent though not tailored for evaluation on this angiographic examination. ANATOMIC VARIANTS: None. DELAYED PHASE: Mild LEFT LEFT parietal and bifrontal cortical enhancement corresponding to areas of prior infarction. MIP images reviewed. IMPRESSION: CT HEAD: 1. No acute intracranial process. 2. Multiple subacute enhancing infarcts. 3. Moderate chronic small vessel ischemic disease. CTA HEAD: 1. No emergent large vessel occlusion with particular attention to LEFT M2 segment. 2. Mild to moderate intracranial atherosclerosis. Severe stenosis LEFT P3 origin. Electronically Signed   By: Elon Alas M.D.   On: 06/23/2017 20:13   Ct Head Wo Contrast  Result Date: 06/23/2017 CLINICAL DATA:  Right frontal headache with nausea and vomiting EXAM: CT HEAD WITHOUT CONTRAST TECHNIQUE: Contiguous axial images were obtained from the base of the skull through the vertex without intravenous contrast. COMPARISON:  Head CT June 03, 2017 and brain MRI June 04, 2017 FINDINGS: Brain: There is slight diffuse atrophy. There is no demonstrable intracranial mass, hemorrhage, extra-axial fluid collection, or midline shift. There is patchy small vessel disease throughout the centra semiovale bilaterally. No focal vascular distribution type infarct is evident. The previously noted multiple small infarcts on diffusion imaging are not delineated on this noncontrast enhanced CT examination. No acute infarct is demonstrated on this study currently. Vascular: There is increased attenuation in the M2 segment of the left middle cerebral artery, a finding that may represent early hyperdense vessel. No similar findings elsewhere. There is calcification in each  carotid siphon. There is also calcification in the distal left vertebral artery. Skull: Bony calvarium appears intact. Sinuses/Orbits: There is opacification throughout much of the inferior left maxillary antrum with what appear to be retention cysts in this area. There is mucosal thickening in several ethmoid air cells. There are apparent retention cysts in the posterior right sphenoid sinus. Other paranasal sinuses appear clear. Orbits appear symmetric bilaterally. Other: Mastoid air cells are clear. IMPRESSION: 1. Question early hyperdense vessel in the periphery of the left middle cerebral artery. Given concern for hyperdense vessel in this area and potential for acute infarct, correlation with brain MRI including diffusion imaging is warranted at this time. 2. Mild atrophy with patchy supratentorial small vessel disease. No well-defined acute infarct evident. The multiple small infarcts noted on recent MR are not well delineated on noncontrast enhanced CT. 3.  No foci of hemorrhage.  No extra-axial fluid collection. 4.  Foci of arterial vascular calcification noted. 5.  Areas of paranasal sinus disease. These results were called by telephone at the time of interpretation on 06/23/2017 at 11:29 am to Reesa Chew, [place] ,who verbally acknowledged these results. Electronically Signed  By: Lowella Grip III M.D.   On: 06/23/2017 11:29   Dg Chest Port 1 View  Result Date: 06/23/2017 CLINICAL DATA:  Cough, congestion, bloody mucus EXAM: PORTABLE CHEST 1 VIEW COMPARISON:  06/14/2017 FINDINGS: Minimal opacity at the left lung base with blunting of the left costophrenic angle, likely reflecting a small left pleural effusion with left basilar atelectasis. Right lung is clear. No pneumothorax. The heart is normal in size.  Tracheostomy in satisfactory position. Right arm PICC terminates at the cavoatrial junction. Enteric tube courses into the stomach. IMPRESSION: Small left pleural effusion with left basilar  atelectasis. Tracheostomy in satisfactory position. Additional support apparatus as above. Electronically Signed   By: Julian Hy M.D.   On: 06/23/2017 14:03   Dg Abd 2 Views  Result Date: 06/23/2017 CLINICAL DATA:  Abdominal distention with vomiting EXAM: ABDOMEN - 2 VIEW COMPARISON:  June 14, 2017 FINDINGS: Supine and upright images were obtained. Feeding tube tip is at the level of the fourth portion of the duodenum. There is contrast in the colon. There is no bowel dilatation or air-fluid level to suggest bowel obstruction. No evident free air. Visualized lung bases are clear. IMPRESSION: No demonstrable bowel obstruction or free air. Feeding tube tip in fourth portion of duodenum. Electronically Signed   By: Lowella Grip III M.D.   On: 06/23/2017 11:50     Scheduled Meds: . acetaZOLAMIDE  250 mg Per Tube TID  . aspirin  325 mg Per Tube Daily  . chlorhexidine gluconate (MEDLINE KIT)  15 mL Mouth Rinse BID  . cloNIDine  0.3 mg Transdermal Weekly  . clopidogrel  75 mg Per Tube Q breakfast  . docusate  100 mg Per Tube Daily  . Investigational - Study Medication  20 mg Per Tube TID  . enoxaparin (LOVENOX) injection  40 mg Subcutaneous Q24H  . estropipate  0.75 mg Per Tube Daily  . famotidine  20 mg Per Tube Daily  . feeding supplement (PRO-STAT SUGAR FREE 64)  30 mL Per Tube Daily  . free water  200 mL Per Tube TID WC & HS  . insulin aspart  0-15 Units Subcutaneous Q4H  . insulin aspart  4 Units Subcutaneous Q4H  . [START ON 06/25/2017] insulin glargine  25 Units Subcutaneous Daily  . mouth rinse  15 mL Mouth Rinse QID  . metoprolol tartrate  100 mg Per Tube BID  . nitrofurantoin (macrocrystal-monohydrate)  100 mg Oral Q12H  . ondansetron (ZOFRAN) IV  4 mg Intravenous Q6H  . pantoprazole (PROTONIX) IV  40 mg Intravenous Q12H  . sucralfate  1 g Oral TID WC & HS  . topiramate  50 mg Oral BID   Continuous Infusions: . dextrose 5 % and 0.45% NaCl 50 mL/hr at 06/23/17 1239  .  feeding supplement (VITAL 1.5 CAL)       LOS: 6 days   Time Spent in minutes   30 minutes  MIKHAIL, MARYANN D.O. on 06/24/2017 at 3:03 PM  Between 7am to 7pm - Pager - [phone]  After 7pm go to [email or web address] - password [email or web address]  And look for the night coverage person covering for me after hours  [facility] Office  [phone]

## 2017-06-25 ENCOUNTER — Inpatient Hospital Stay (HOSPITAL_COMMUNITY): Payer: Medicare Other | Admitting: Speech Pathology

## 2017-06-25 ENCOUNTER — Encounter (HOSPITAL_COMMUNITY): Payer: Self-pay | Admitting: General Surgery

## 2017-06-25 ENCOUNTER — Inpatient Hospital Stay (HOSPITAL_COMMUNITY): Payer: Medicare Other | Admitting: Physical Therapy

## 2017-06-25 ENCOUNTER — Telehealth: Payer: Self-pay | Admitting: Neurology

## 2017-06-25 ENCOUNTER — Inpatient Hospital Stay (HOSPITAL_COMMUNITY): Payer: Medicare Other

## 2017-06-25 ENCOUNTER — Inpatient Hospital Stay (HOSPITAL_COMMUNITY): Payer: Medicare Other | Admitting: Occupational Therapy

## 2017-06-25 HISTORY — PX: IR GASTROSTOMY TUBE MOD SED: IMG625

## 2017-06-25 LAB — BASIC METABOLIC PANEL
ANION GAP: 9 (ref 5–15)
BUN: 28 mg/dL — ABNORMAL HIGH (ref 6–20)
CHLORIDE: 107 mmol/L (ref 101–111)
CO2: 22 mmol/L (ref 22–32)
Calcium: 9.1 mg/dL (ref 8.9–10.3)
Creatinine, Ser: 0.96 mg/dL (ref 0.44–1.00)
GFR calc non Af Amer: >60 mL/min (ref >60)
Glucose, Bld: 173 mg/dL — ABNORMAL HIGH (ref 65–99)
POTASSIUM: 3.7 mmol/L (ref 3.5–5.1)
Sodium: 138 mmol/L (ref 135–145)

## 2017-06-25 LAB — GLUCOSE, CAPILLARY
GLUCOSE-CAPILLARY: 120 mg/dL — AB (ref 65–99)
Glucose-Capillary: 151 mg/dL — ABNORMAL HIGH (ref 65–99)
Glucose-Capillary: 166 mg/dL — ABNORMAL HIGH (ref 65–99)
Glucose-Capillary: 224 mg/dL — ABNORMAL HIGH (ref 65–99)

## 2017-06-25 LAB — APTT: aPTT: 26 seconds (ref 24–36)

## 2017-06-25 LAB — CBC
HCT: 30.5 % — ABNORMAL LOW (ref 36.0–46.0)
HEMOGLOBIN: 9.7 g/dL — AB (ref 12.0–15.0)
MCH: 28.1 pg (ref 26.0–34.0)
MCHC: 31.8 g/dL (ref 30.0–36.0)
MCV: 88.4 fL (ref 78.0–100.0)
PLATELETS: 380 10*3/uL (ref 150–400)
RBC: 3.45 MIL/uL — ABNORMAL LOW (ref 3.87–5.11)
RDW: 15.1 % (ref 11.5–15.5)
WBC: 9.4 10*3/uL (ref 4.0–10.5)

## 2017-06-25 LAB — PROTIME-INR
INR: 1.06
Prothrombin Time: 13.7 seconds (ref 11.4–15.2)

## 2017-06-25 LAB — PLATELET INHIBITION P2Y12: Platelet Function  P2Y12: 68 [PRU] — ABNORMAL LOW (ref 194–418)

## 2017-06-25 MED ORDER — LIDOCAINE HCL (PF) 1 % IJ SOLN
INTRAMUSCULAR | Status: AC
  Filled 2017-06-25: qty 30

## 2017-06-25 MED ORDER — IOPAMIDOL (ISOVUE-300) INJECTION 61%
INTRAVENOUS | Status: AC
  Administered 2017-06-25: 10 mL
  Filled 2017-06-25: qty 50

## 2017-06-25 MED ORDER — FREE WATER
200.0000 mL | Freq: Three times a day (TID) | Status: DC
  Administered 2017-06-26 – 2017-06-29 (×12): 200 mL

## 2017-06-25 MED ORDER — CLOPIDOGREL BISULFATE 75 MG PO TABS
75.0000 mg | ORAL_TABLET | Freq: Every day | ORAL | Status: DC
  Administered 2017-06-26 – 2017-06-29 (×4): 75 mg
  Filled 2017-06-25 (×4): qty 1

## 2017-06-25 MED ORDER — VANCOMYCIN HCL IN DEXTROSE 1-5 GM/200ML-% IV SOLN
1000.0000 mg | INTRAVENOUS | Status: AC
  Administered 2017-06-25: 1000 mg via INTRAVENOUS
  Filled 2017-06-25: qty 200

## 2017-06-25 MED ORDER — MIDAZOLAM HCL 2 MG/2ML IJ SOLN
INTRAMUSCULAR | Status: AC | PRN
  Administered 2017-06-25 (×2): 1 mg via INTRAVENOUS

## 2017-06-25 MED ORDER — FENTANYL CITRATE (PF) 100 MCG/2ML IJ SOLN
INTRAMUSCULAR | Status: AC | PRN
  Administered 2017-06-25 (×2): 50 ug via INTRAVENOUS

## 2017-06-25 MED ORDER — VANCOMYCIN HCL IN DEXTROSE 1-5 GM/200ML-% IV SOLN
INTRAVENOUS | Status: AC
  Administered 2017-06-25: 1 g
  Filled 2017-06-25: qty 200

## 2017-06-25 MED ORDER — VITAL 1.5 CAL PO LIQD
1000.0000 mL | ORAL | Status: DC
  Administered 2017-06-26 – 2017-07-01 (×4): 1000 mL
  Filled 2017-06-25 (×11): qty 1000

## 2017-06-25 MED ORDER — GLUCAGON HCL RDNA (DIAGNOSTIC) 1 MG IJ SOLR
INTRAMUSCULAR | Status: AC
  Filled 2017-06-25: qty 1

## 2017-06-25 MED ORDER — ENOXAPARIN SODIUM 40 MG/0.4ML ~~LOC~~ SOLN
40.0000 mg | SUBCUTANEOUS | Status: DC
  Administered 2017-06-25 – 2017-06-29 (×4): 40 mg via SUBCUTANEOUS
  Filled 2017-06-25 (×4): qty 0.4

## 2017-06-25 MED ORDER — FENTANYL CITRATE (PF) 100 MCG/2ML IJ SOLN
INTRAMUSCULAR | Status: AC
  Filled 2017-06-25: qty 2

## 2017-06-25 MED ORDER — MIDAZOLAM HCL 2 MG/2ML IJ SOLN
INTRAMUSCULAR | Status: AC
  Filled 2017-06-25: qty 2

## 2017-06-25 MED ORDER — LIDOCAINE HCL (PF) 1 % IJ SOLN
INTRAMUSCULAR | Status: DC | PRN
  Administered 2017-06-25: 7 mL

## 2017-06-25 MED ORDER — GLUCAGON HCL (RDNA) 1 MG IJ SOLR
INTRAMUSCULAR | Status: AC | PRN
  Administered 2017-06-25: 1 mg via INTRAVENOUS

## 2017-06-25 NOTE — Telephone Encounter (Signed)
Attempted to call home # and not available.  Will try later.

## 2017-06-25 NOTE — Consult Note (Signed)
Chief Complaint: dysphagia secondary to CVA  Referring Physician:Dr. Alysia Penna  Supervising Physician: Jacqulynn Cadet  Patient Status: [facility] [place] - In-pt  HPI: [name] is a 66 y.o. female who was admitted several weeks ago secondary to a CVA.  She underwent revascularization by Dr. Estanislado Pandy.  She was later unable to be extubated and required tracheostomy placement.  She has yet to pass a swallow evaluation and is now requiring a g-tube to be placed to assist with feeding.  She is on plavix secondary to this recent CVA.    Past Medical History:  Past Medical History:  Diagnosis Date  . Anemia    takes iron supplement  . Anesthesia complication    disorientation due to pseudotumor  . Diabetes mellitus    IDDM  . Family history of anesthesia complication 14 yrs ago   brother stopped breathing for a minute or two  . GERD (gastroesophageal reflux disease)   . Headache(784.0)    due to pseudotumor; daily headache  . Hypercholesteremia    unable to tolerate statins  . Hypertension    under control with meds., has been on med. x 5 yr.  . Peripheral neuropathy   . PONV (postoperative nausea and vomiting)   . 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 Synovitis of ankle 04/2013   left  . Wears dentures    full    Past Surgical History:  Past Surgical History:  Procedure Laterality Date  . ANKLE ARTHROSCOPY Left 05/18/2013   Procedure: LEFT ANKLE ARTHROSCOPY WITH DEBRIDEMENT,  SUBTALAR OPEN DEBRIDEMENT ;  Surgeon: Wylene Simmer, MD;  Location: [place];  Service: Orthopedics;  Laterality: Left;  . APPENDECTOMY     as a child  . BALLOON DILATION N/A 07/04/2013   Procedure: BALLOON DILATION;  Surgeon: Garlan Fair, MD;  Location: [facility];  Service: Endoscopy;  Laterality: N/A;  . BLADDER SUSPENSION    . BREAST LUMPECTOMY W/ NEEDLE LOCALIZATION Left 05/22/2011  . CAROTID  ANGIOGRAPHY Bilateral 06/03/2017   Procedure: Bilateral Carotid Angiography;  Surgeon: Conrad New Washington, MD;  Location: [place] [facility];  Service: Cardiovascular;  Laterality: Bilateral;  . COLONOSCOPY WITH PROPOFOL N/A 05/14/2014   Procedure: COLONOSCOPY WITH PROPOFOL;  Surgeon: Garlan Fair, MD;  Location: [facility];  Service: Endoscopy;  Laterality: N/A;  . ESOPHAGEAL DILATION  06/23/2006; 08/05/2004  . ESOPHAGOGASTRODUODENOSCOPY N/A 07/04/2013   Procedure: ESOPHAGOGASTRODUODENOSCOPY (EGD);  Surgeon: Garlan Fair, MD;  Location: [facility];  Service: Endoscopy;  Laterality: N/A;  . ESOPHAGOGASTRODUODENOSCOPY (EGD) WITH PROPOFOL N/A 05/14/2014   Procedure: ESOPHAGOGASTRODUODENOSCOPY (EGD) WITH PROPOFOL;  Surgeon: Garlan Fair, MD;  Location: [facility];  Service: Endoscopy;  Laterality: N/A;  . IR ANGIO VERTEBRAL SEL SUBCLAVIAN INNOMINATE UNI R MOD SED  06/03/2017  . IR PERCUTANEOUS ART THROMBECTOMY/INFUSION INTRACRANIAL INC DIAG ANGIO  06/03/2017  . IR PERCUTANEOUS ART THROMBECTOMY/INFUSION INTRACRANIAL INC DIAG ANGIO  06/03/2017  . LUMBAR PERITONEAL SHUNT     x 2  . OVARIAN CYST REMOVAL     age 7  . RADIOLOGY WITH ANESTHESIA N/A 06/03/2017   Procedure: RADIOLOGY WITH ANESTHESIA;  Surgeon: Luanne Bras, MD;  Location: [place];  Service: Radiology;  Laterality: N/A;  . SHUNT REVISION  2007  . TOTAL ABDOMINAL HYSTERECTOMY W/ BILATERAL SALPINGOOPHORECTOMY  1994   age 63s  . WRIST SURGERY Left 2014    dr  gramig    Family History:  Family History  Problem Relation Age of Onset  . Heart disease Father   . Migraines Neg Hx     Social History:  reports that she has never smoked. She has never used smokeless tobacco. She reports that she does not drink alcohol or use drugs.  Allergies:  Allergies  Allergen Reactions  . Carbamazepine Hives, Shortness Of Breath and Other (See Comments)  . Penicillins Anaphylaxis and Other (See Comments)    Mother, father and brother have  history of anaphylaxis reaction to penicillin so pt does not take   . Statins Other (See Comments)    Severe muscle weakness, leg numbness, severe headaches, chest pain   . Tricyclic Antidepressants Other (See Comments)    IMPAIRED MEMORY  . Atorvastatin Other (See Comments)    Severe muscle weakness, leg numbness, severe headaches, chest pain   . Ezetimibe Other (See Comments)    Severe muscle weakness, leg numbness, severe headaches, chest pain   . Nortriptyline Other (See Comments)    IMPAIRED MEMORY  . Quinapril Hcl Other (See Comments)    Unknown reaction  . Rifampin Diarrhea  . Metoclopramide Nausea And Vomiting and Rash    Medications: Medications reviewed in epic  Unable to do a ROS completely as the patient doesn't talk..  Mallampati Score: MD Evaluation Airway: Other (comments) Airway comments: trach in place Heart: WNL Abdomen: WNL Chest/ Lungs: Other (comments) Chest/ lungs comments: coarse breath sounds ASA  Classification: 3 Mallampati/Airway Score:  (trach in place)  Physical Exam: BP 121/69 (BP Location: Left Arm)   Pulse 75   Temp 98.6 F (37 C) (Oral)   Resp 16   Ht 5' 4"  (1.626 m)   Wt 164 lb 3.9 oz (74.5 kg)   SpO2 100%   BMI 28.19 kg/m  Body mass index is 28.19 kg/m. General: pleasant, chronically ill appearing white female who is laying in bed in NAD HEENT: head is normocephalic, atraumatic.  Sclera are noninjected.  PERRL.  Ears and nose without any masses or lesions.  Mouth is pink.  Trach in place Heart: regular, rate, and rhythm.  Normal s1,s2. No obvious murmurs, gallops, or rubs noted.  Palpable radial pulses bilaterally Lungs: Coarse breath sounds bilaterally.  Respiratory effort nonlabored on trach with PMV in place Abd: soft, NT, ND, +BS, no masses, hernias, or organomegaly.  Multiple scars from prior surgeries Psych: A&O with an appropriate affect.   Labs: Results for orders placed or performed during the hospital encounter of  06/18/17 (from the past 48 hour(s))  CBC with Differential/Platelet     Status: Abnormal   Collection Time: 06/23/17 12:58 PM  Result Value Ref Range   WBC 10.5 4.0 - 10.5 K/uL   RBC 3.57 (L) 3.87 - 5.11 MIL/uL   Hemoglobin 9.9 (L) 12.0 - 15.0 g/dL   HCT 31.3 (L) 36.0 - 46.0 %   MCV 87.7 78.0 - 100.0 fL   MCH 27.7 26.0 - 34.0 pg   MCHC 31.6 30.0 - 36.0 g/dL   RDW 14.7 11.5 - 15.5 %   Platelets 419 (H) 150 - 400 K/uL   Neutrophils Relative % 82 %   Neutro Abs 8.6 (H) 1.7 - 7.7 K/uL   Lymphocytes Relative 11 %   Lymphs Abs 1.2 0.7 - 4.0 K/uL   Monocytes Relative 6 %   Monocytes Absolute 0.6 0.1 - 1.0 K/uL   Eosinophils Relative 1 %   Eosinophils Absolute 0.2 0.0 - 0.7  K/uL   Basophils Relative 0 %   Basophils Absolute 0.0 0.0 - 0.1 K/uL  Comprehensive metabolic panel     Status: Abnormal   Collection Time: 06/23/17 12:58 PM  Result Value Ref Range   Sodium 135 135 - 145 mmol/L   Potassium 4.1 3.5 - 5.1 mmol/L   Chloride 103 101 - 111 mmol/L   CO2 23 22 - 32 mmol/L   Glucose, Bld 191 (H) 65 - 99 mg/dL   BUN 30 (H) 6 - 20 mg/dL   Creatinine, Ser 0.82 0.44 - 1.00 mg/dL   Calcium 9.1 8.9 - 10.3 mg/dL   Total Protein 7.1 6.5 - 8.1 g/dL   Albumin 3.0 (L) 3.5 - 5.0 g/dL   AST 38 15 - 41 U/L   ALT 30 14 - 54 U/L   Alkaline Phosphatase 129 (H) 38 - 126 U/L   Total Bilirubin 0.5 0.3 - 1.2 mg/dL   GFR calc non Af Amer >60 >60 mL/min   GFR calc Af Amer >60 >60 mL/min    Comment: (NOTE) The eGFR has been calculated using the CKD EPI equation. This calculation has not been validated in all clinical situations. eGFR's persistently <60 mL/min signify possible Chronic Kidney Disease.    Anion gap 9 5 - 15  Glucose, capillary     Status: Abnormal   Collection Time: 06/23/17  1:13 PM  Result Value Ref Range   Glucose-Capillary 161 (H) 65 - 99 mg/dL   Comment 1 Notify RN   Glucose, capillary     Status: Abnormal   Collection Time: 06/23/17  5:05 PM  Result Value Ref Range    Glucose-Capillary 170 (H) 65 - 99 mg/dL   Comment 1 Notify RN   Glucose, capillary     Status: Abnormal   Collection Time: 06/23/17  9:25 PM  Result Value Ref Range   Glucose-Capillary 144 (H) 65 - 99 mg/dL  Glucose, capillary     Status: Abnormal   Collection Time: 06/23/17 11:49 PM  Result Value Ref Range   Glucose-Capillary 198 (H) 65 - 99 mg/dL  Glucose, capillary     Status: Abnormal   Collection Time: 06/24/17  4:30 AM  Result Value Ref Range   Glucose-Capillary 186 (H) 65 - 99 mg/dL  Basic metabolic panel     Status: Abnormal   Collection Time: 06/24/17  5:32 AM  Result Value Ref Range   Sodium 135 135 - 145 mmol/L   Potassium 3.8 3.5 - 5.1 mmol/L   Chloride 106 101 - 111 mmol/L   CO2 20 (L) 22 - 32 mmol/L   Glucose, Bld 197 (H) 65 - 99 mg/dL   BUN 26 (H) 6 - 20 mg/dL   Creatinine, Ser 0.92 0.44 - 1.00 mg/dL   Calcium 9.1 8.9 - 10.3 mg/dL   GFR calc non Af Amer >60 >60 mL/min   GFR calc Af Amer >60 >60 mL/min    Comment: (NOTE) The eGFR has been calculated using the CKD EPI equation. This calculation has not been validated in all clinical situations. eGFR's persistently <60 mL/min signify possible Chronic Kidney Disease.    Anion gap 9 5 - 15  CBC     Status: Abnormal   Collection Time: 06/24/17  5:32 AM  Result Value Ref Range   WBC 9.4 4.0 - 10.5 K/uL   RBC 3.43 (L) 3.87 - 5.11 MIL/uL   Hemoglobin 9.8 (L) 12.0 - 15.0 g/dL   HCT 30.5 (L) 36.0 - 46.0 %  MCV 88.9 78.0 - 100.0 fL   MCH 28.6 26.0 - 34.0 pg   MCHC 32.1 30.0 - 36.0 g/dL   RDW 15.2 11.5 - 15.5 %   Platelets 393 150 - 400 K/uL  Glucose, capillary     Status: Abnormal   Collection Time: 06/24/17  9:29 AM  Result Value Ref Range   Glucose-Capillary 201 (H) 65 - 99 mg/dL  Glucose, capillary     Status: Abnormal   Collection Time: 06/24/17 12:50 PM  Result Value Ref Range   Glucose-Capillary 191 (H) 65 - 99 mg/dL  Glucose, capillary     Status: Abnormal   Collection Time: 06/24/17  5:07 PM  Result  Value Ref Range   Glucose-Capillary 125 (H) 65 - 99 mg/dL  Glucose, capillary     Status: Abnormal   Collection Time: 06/24/17  8:04 PM  Result Value Ref Range   Glucose-Capillary 204 (H) 65 - 99 mg/dL  Glucose, capillary     Status: None   Collection Time: 06/24/17 11:23 PM  Result Value Ref Range   Glucose-Capillary 98 65 - 99 mg/dL  Glucose, capillary     Status: Abnormal   Collection Time: 06/25/17  3:45 AM  Result Value Ref Range   Glucose-Capillary 151 (H) 65 - 99 mg/dL  CBC     Status: Abnormal   Collection Time: 06/25/17  4:33 AM  Result Value Ref Range   WBC 9.4 4.0 - 10.5 K/uL   RBC 3.45 (L) 3.87 - 5.11 MIL/uL   Hemoglobin 9.7 (L) 12.0 - 15.0 g/dL   HCT 30.5 (L) 36.0 - 46.0 %   MCV 88.4 78.0 - 100.0 fL   MCH 28.1 26.0 - 34.0 pg   MCHC 31.8 30.0 - 36.0 g/dL   RDW 15.1 11.5 - 15.5 %   Platelets 380 150 - 400 K/uL    Imaging: Ct Angio Head W Or Wo Contrast  Result Date: 06/23/2017 CLINICAL DATA:  Worsening nausea and vomiting beginning this morning. Assess for recurrent stroke. Status post endovascular revascularization RIGHT middle cerebral artery occlusion. History of seizures, shunt placed for pseudotumor cerebri, hypertension. EXAM: CT ANGIOGRAPHY HEAD TECHNIQUE: Multidetector CT imaging of the head was performed using the standard protocol during bolus administration of intravenous contrast. Multiplanar CT image reconstructions and MIPs were obtained to evaluate the vascular anatomy. CONTRAST:  50 cc Isovue 370 COMPARISON:  MRI of the head June 04, 2017 and CT HEAD June 23, 2017 at 1111 hours FINDINGS: CT HEAD BRAIN: No intraparenchymal hemorrhage, mass effect nor midline shift. The ventricles and sulci are normal for age. Patchy supratentorial white matter hypodensities within normal range for patient's age, though non-specific are most compatible with chronic small vessel ischemic disease. Multiple small cortical infarcts better demonstrated on prior MRI. No acute  large vascular territory infarcts. No abnormal extra-axial fluid collections. Basal cisterns are patent. VASCULAR: Mild calcific atherosclerosis of the carotid siphons. SKULL: No skull fracture. No significant scalp soft tissue swelling. SINUSES/ORBITS: Nasogastric tube via LEFT nares. LEFT maxillary and RIGHT sphenoid mucosal thickening. Mastoid air cells are well aerated. The included ocular globes and orbital contents are non-suspicious. Soft post bilateral ocular lens implants. OTHER: None. CTA HEAD ANTERIOR CIRCULATION: Patent cervical internal carotid arteries, petrous, cavernous and supra clinoid internal carotid arteries. Widely patent anterior communicating artery. Prominent LEFT posterior communicating artery origin infundibulum. Patent anterior and middle cerebral arteries, mild-to-moderate luminal irregularity. No large vessel occlusion, significant stenosis, contrast extravasation or aneurysm. POSTERIOR CIRCULATION: LEFT vertebral artery is  dominant. Patent vertebral arteries, vertebrobasilar junction and basilar artery, as well as main branch vessels. Mild tandem stenosis basilar artery. Patent posterior cerebral arteries, mild to moderate luminal irregularity. Fetal origin RIGHT posterior cerebral artery. Severe stenosis LEFT P3 origin. No large vessel occlusion, contrast extravasation or aneurysm. VENOUS SINUSES: Major dural venous sinuses are patent though not tailored for evaluation on this angiographic examination. ANATOMIC VARIANTS: None. DELAYED PHASE: Mild LEFT LEFT parietal and bifrontal cortical enhancement corresponding to areas of prior infarction. MIP images reviewed. IMPRESSION: CT HEAD: 1. No acute intracranial process. 2. Multiple subacute enhancing infarcts. 3. Moderate chronic small vessel ischemic disease. CTA HEAD: 1. No emergent large vessel occlusion with particular attention to LEFT M2 segment. 2. Mild to moderate intracranial atherosclerosis. Severe stenosis LEFT P3 origin.  Electronically Signed   By: Elon Alas M.D.   On: 06/23/2017 20:13   Dg Chest Port 1 View  Result Date: 06/23/2017 CLINICAL DATA:  Cough, congestion, bloody mucus EXAM: PORTABLE CHEST 1 VIEW COMPARISON:  06/14/2017 FINDINGS: Minimal opacity at the left lung base with blunting of the left costophrenic angle, likely reflecting a small left pleural effusion with left basilar atelectasis. Right lung is clear. No pneumothorax. The heart is normal in size.  Tracheostomy in satisfactory position. Right arm PICC terminates at the cavoatrial junction. Enteric tube courses into the stomach. IMPRESSION: Small left pleural effusion with left basilar atelectasis. Tracheostomy in satisfactory position. Additional support apparatus as above. Electronically Signed   By: Julian Hy M.D.   On: 06/23/2017 14:03   Dg Abd 2 Views  Result Date: 06/23/2017 CLINICAL DATA:  Abdominal distention with vomiting EXAM: ABDOMEN - 2 VIEW COMPARISON:  June 14, 2017 FINDINGS: Supine and upright images were obtained. Feeding tube tip is at the level of the fourth portion of the duodenum. There is contrast in the colon. There is no bowel dilatation or air-fluid level to suggest bowel obstruction. No evident free air. Visualized lung bases are clear. IMPRESSION: No demonstrable bowel obstruction or free air. Feeding tube tip in fourth portion of duodenum. Electronically Signed   By: Lowella Grip III M.D.   On: 06/23/2017 11:50    Assessment/Plan 1. Dysphagia secondary to CVA  We will plan to place a g-tube today while on plavix given recent CVA and neurology doesn't want the patient taken off of plavix.  Her tube feeds have been held and she is otherwise ready.  I have discussed the risks of bleeding and infection with the patient and daughter given the continuation of plavix as well as the risk of infection of her VP shunt.   Risks and benefits discussed with the patient and daughter including, but not limited to the  need for a barium enema during the procedure, bleeding, infection, peritonitis, or damage to adjacent structures. All of the patient's daughter's questions were answered, patient and daughter are agreeable to proceed. Consent signed and in chart.   Thank you for this interesting consult.  I greatly enjoyed meeting [name] and look forward to participating in their care.  A copy of this report was sent to the requesting provider on this date.  Electronically Signed: Henreitta Cea 06/25/2017, 11:35 AM   I spent a total of 40 Minutes    in face to face in clinical consultation, greater than 50% of which was counseling/coordinating care for dysphagia

## 2017-06-25 NOTE — Progress Notes (Signed)
Speech Language Pathology Weekly Progress and Session Note  Patient Details  Name: [name] MRN: [id number] Date of Birth: 10/24/1950  Beginning of progress report period: June 19, 2017 End of progress report period: June 25, 2017  Today's Date: 06/25/2017 SLP Individual Time: 1030-1100 SLP Individual Time Calculation (min): 30 min  Short Term Goals: Week 1: SLP Short Term Goal 1 (Week 1): Pt will tolerate PMSV without decline in vitals during all waking hours.  SLP Short Term Goal 1 - Progress (Week 1): Met SLP Short Term Goal 2 (Week 1): Pt will consume trials of honey thick via spoon and puree with minimal overt s/s of aspiration to demonstrate readiness for repeat instrumental swallow study. SLP Short Term Goal 2 - Progress (Week 1): Not met SLP Short Term Goal 3 (Week 1): Pt will increase vocal intensity with Mod A cues to achieve ~ 90% intelligiblity at the phrase level.  SLP Short Term Goal 3 - Progress (Week 1): Not met SLP Short Term Goal 4 (Week 1): Pt will increase sustained attention to task for ~ 20 minutes with Mod A cues.  SLP Short Term Goal 4 - Progress (Week 1): Not met SLP Short Term Goal 5 (Week 1): Pt will demonstrate basic problem solving for familiar tasks with Mod A cues.  SLP Short Term Goal 5 - Progress (Week 1): Met    New Short Term Goals: Week 2: SLP Short Term Goal 1 (Week 2): Pt will consume trials of honey thick via spoon and puree with minimal overt s/s of aspiration to demonstrate readiness for repeat instrumental swallow study. SLP Short Term Goal 2 (Week 2): Pt will increase vocal intensity with Mod A cues to achieve ~ 90% intelligiblity at the phrase level.  SLP Short Term Goal 3 (Week 2): Pt will increase sustained attention to task for ~ 20 minutes with Mod A cues.  SLP Short Term Goal 4 (Week 2): Pt will demonstrate basic problem solving for familiar tasks with Mod A cues.   Weekly Progress Updates: Pt with minimal progress this  reporting period d/t some medical issues with n/v. Pt has made progress towards PMSV toleration and has met that goal. Skilled ST is required to continue to address Max A deficits in all areas of function.       Intensity: Minumum of 1-2 x/day, 30 to 90 minutes Frequency: 3 to 5 out of 7 days Duration/Length of Stay: 3 weeks Treatment/Interventions: Cognitive remediation/compensation;Cueing hierarchy;Dysphagia/aspiration precaution training;Functional tasks;Patient/family education;Therapeutic Activities;Internal/external aids   Daily Session  Skilled Therapeutic Interventions:  Skilled treatment session #1 focused on cognition goals. SLP received pt upright in bed with daughter present and PMSV in place. Pt coughed x 2 with ability to spit orally with Max A emotional support given to spit. Pt required Max A cues to count money, perform simple money adding task and to increase vocal intensity to achieve ~ 50% intelligibility at the word to phrase level. PO intake withheld d/t NPO status for PEG procedure.       Function:    Cognition Comprehension Comprehension assist level: Understands basic 50 - 74% of the time/ requires cueing 25 - 49% of the time;Understands basic 25 - 49% of the time/ requires cueing 50 - 75% of the time  Expression Expression assistive device: Talk trach valve Expression assist level: Expresses basis less than 25% of the time/requires cueing >75% of the time.  Social Interaction Social Interaction assist level: Interacts appropriately 25 - 49% of time -  Needs frequent redirection.  Problem Solving Problem solving assist level: Solves basic 25 - 49% of the time - needs direction more than half the time to initiate, plan or complete simple activities  Memory Memory assist level: Recognizes or recalls less than 25% of the time/requires cueing greater than 75% of the time;Recognizes or recalls 25 - 49% of the time/requires cueing 50 - 75% of the time   General     Pain    Therapy/Group: Individual Therapy  [name] 06/25/2017, 12:19 PM

## 2017-06-25 NOTE — Telephone Encounter (Signed)
Can you call patient and ask if if she would come for an appointment with me in the next few weeks, before the end of September? I need 45 minutes to an hour with her. So you can give her 2x60minute slots or you can put her in a 45-minute emg slot.A 4pm appointment would work as well.  If I don;t have anything available, ask her roughly what days/times work for her and I will try and move around her schedule. I want to see her since she was in the hospital and discuss her stroke and her new headaches. thanks

## 2017-06-25 NOTE — Progress Notes (Signed)
Occupational Therapy Note  Patient Details  Name: [name] MRN: [id number] Date of Birth: November 24, 1950   Attempted to see pt for 30 mins make up time, however pt refusing therapy due to just receiving lorazepam with plan for PEG tube intervention and reports increase in lethargy.  Will continue to follow per POC.  Simonne Come 06/25/2017, 11:35 AM

## 2017-06-25 NOTE — Progress Notes (Signed)
Discussed patient with Ms. Reesa Chew, PA.   [facility] consulted for nausea, vomiting, hematemesis, and possible new CVA.  CTA head and neck obtained, no new CVA noted. Neurology also confirm this. Patient has been having intractable nausea vomiting, suspect possibly due to CVA. Continue antiemetics as needed. Patient may need alternate mode or route of tube feeding such as PEG placement, to reduce risk of aspiration. Patient was also noted to have some hematemesis, however hemoglobin currently stable. GI was also consulted. Suspect patient may have some type of gastric abrasion versus stress gastritis from recent CVA. Continue proton Today for 1 week, daily thereafter given the patient is on anti-platelets.   At this time, TR[facility] will sign off. Please reconsult if we can be of further assistance.   Time spent: 20 minutes  Ulmer Degen D.O. [facility] Pager [phone]  If 7PM-7AM, please contact night-coverage [email or web address] Password [email or web address] 06/25/2017, 12:41 PM

## 2017-06-25 NOTE — Progress Notes (Signed)
[place] [facility]IC[facility] & RE[facility]ABILITATION     PROGRESS NOTE  Subjective/Complaints:   Patient had no vomiting last night, no epistaxis or hemoptysis. Discussed test results with patient and daughter. Discussed need for PEG  ROS: limited  Objective:  Vital Signs: Blood pressure 121/69, pulse 75, temperature 98.6 F (37 C), temperature source Oral, resp. rate 16, height 5\' 4"  (1.626 m), weight 74.5 kg (164 lb 3.9 oz), SpO2 100 %. Ct Angio Head W Or Wo Contrast  Result Date: 06/23/2017 CLINICAL DATA:  Worsening nausea and vomiting beginning this morning. Assess for recurrent stroke. Status post endovascular revascularization RIGHT middle cerebral artery occlusion. History of seizures, shunt placed for pseudotumor cerebri, hypertension. EXAM: CT ANGIOGRAPHY HEAD TECHNIQUE: Multidetector CT imaging of the head was performed using the standard protocol during bolus administration of intravenous contrast. Multiplanar CT image reconstructions and MIPs were obtained to evaluate the vascular anatomy. CONTRAST:  50 cc Isovue 370 COMPARISON:  MRI of the head June 04, 2017 and CT HEAD June 23, 2017 at 1111 hours FINDINGS: CT HEAD BRAIN: No intraparenchymal hemorrhage, mass effect nor midline shift. The ventricles and sulci are normal for age. Patchy supratentorial white matter hypodensities within normal range for patient's age, though non-specific are most compatible with chronic small vessel ischemic disease. Multiple small cortical infarcts better demonstrated on prior MRI. No acute large vascular territory infarcts. No abnormal extra-axial fluid collections. Basal cisterns are patent. VASCULAR: Mild calcific atherosclerosis of the carotid siphons. SKULL: No skull fracture. No significant scalp soft tissue swelling. SINUSES/ORBITS: Nasogastric tube via LEFT nares. LEFT maxillary and RIGHT sphenoid mucosal thickening. Mastoid air cells are well aerated. The included ocular globes and orbital contents  are non-suspicious. Soft post bilateral ocular lens implants. OTHER: None. CTA HEAD ANTERIOR CIRCULATION: Patent cervical internal carotid arteries, petrous, cavernous and supra clinoid internal carotid arteries. Widely patent anterior communicating artery. Prominent LEFT posterior communicating artery origin infundibulum. Patent anterior and middle cerebral arteries, mild-to-moderate luminal irregularity. No large vessel occlusion, significant stenosis, contrast extravasation or aneurysm. POSTERIOR CIRCULATION: LEFT vertebral artery is dominant. Patent vertebral arteries, vertebrobasilar junction and basilar artery, as well as main branch vessels. Mild tandem stenosis basilar artery. Patent posterior cerebral arteries, mild to moderate luminal irregularity. Fetal origin RIGHT posterior cerebral artery. Severe stenosis LEFT P3 origin. No large vessel occlusion, contrast extravasation or aneurysm. VENOUS SINUSES: Major dural venous sinuses are patent though not tailored for evaluation on this angiographic examination. ANATOMIC VARIANTS: None. DELAYED PHASE: Mild LEFT LEFT parietal and bifrontal cortical enhancement corresponding to areas of prior infarction. MIP images reviewed. IMPRESSION: CT HEAD: 1. No acute intracranial process. 2. Multiple subacute enhancing infarcts. 3. Moderate chronic small vessel ischemic disease. CTA HEAD: 1. No emergent large vessel occlusion with particular attention to LEFT M2 segment. 2. Mild to moderate intracranial atherosclerosis. Severe stenosis LEFT P3 origin. Electronically Signed   By: Elon Alas M.D.   On: 06/23/2017 20:13   Ct Head Wo Contrast  Result Date: 06/23/2017 CLINICAL DATA:  Right frontal headache with nausea and vomiting EXAM: CT HEAD WITHOUT CONTRAST TECHNIQUE: Contiguous axial images were obtained from the base of the skull through the vertex without intravenous contrast. COMPARISON:  Head CT June 03, 2017 and brain MRI June 04, 2017 FINDINGS: Brain:  There is slight diffuse atrophy. There is no demonstrable intracranial mass, hemorrhage, extra-axial fluid collection, or midline shift. There is patchy small vessel disease throughout the centra semiovale bilaterally. No focal vascular distribution type infarct is evident. The previously noted  multiple small infarcts on diffusion imaging are not delineated on this noncontrast enhanced CT examination. No acute infarct is demonstrated on this study currently. Vascular: There is increased attenuation in the M2 segment of the left middle cerebral artery, a finding that may represent early hyperdense vessel. No similar findings elsewhere. There is calcification in each carotid siphon. There is also calcification in the distal left vertebral artery. Skull: Bony calvarium appears intact. Sinuses/Orbits: There is opacification throughout much of the inferior left maxillary antrum with what appear to be retention cysts in this area. There is mucosal thickening in several ethmoid air cells. There are apparent retention cysts in the posterior right sphenoid sinus. Other paranasal sinuses appear clear. Orbits appear symmetric bilaterally. Other: Mastoid air cells are clear. IMPRESSION: 1. Question early hyperdense vessel in the periphery of the left middle cerebral artery. Given concern for hyperdense vessel in this area and potential for acute infarct, correlation with brain MRI including diffusion imaging is warranted at this time. 2. Mild atrophy with patchy supratentorial small vessel disease. No well-defined acute infarct evident. The multiple small infarcts noted on recent MR are not well delineated on noncontrast enhanced CT. 3.  No foci of hemorrhage.  No extra-axial fluid collection. 4.  Foci of arterial vascular calcification noted. 5.  Areas of paranasal sinus disease. These results were called by telephone at the time of interpretation on 06/23/2017 at 11:29 am to Reesa Chew, [place] ,who verbally acknowledged these  results. Electronically Signed   By: Lowella Grip III M.D.   On: 06/23/2017 11:29   Dg Chest Port 1 View  Result Date: 06/23/2017 CLINICAL DATA:  Cough, congestion, bloody mucus EXAM: PORTABLE CHEST 1 VIEW COMPARISON:  06/14/2017 FINDINGS: Minimal opacity at the left lung base with blunting of the left costophrenic angle, likely reflecting a small left pleural effusion with left basilar atelectasis. Right lung is clear. No pneumothorax. The heart is normal in size.  Tracheostomy in satisfactory position. Right arm PICC terminates at the cavoatrial junction. Enteric tube courses into the stomach. IMPRESSION: Small left pleural effusion with left basilar atelectasis. Tracheostomy in satisfactory position. Additional support apparatus as above. Electronically Signed   By: Julian Hy M.D.   On: 06/23/2017 14:03   Dg Abd 2 Views  Result Date: 06/23/2017 CLINICAL DATA:  Abdominal distention with vomiting EXAM: ABDOMEN - 2 VIEW COMPARISON:  June 14, 2017 FINDINGS: Supine and upright images were obtained. Feeding tube tip is at the level of the fourth portion of the duodenum. There is contrast in the colon. There is no bowel dilatation or air-fluid level to suggest bowel obstruction. No evident free air. Visualized lung bases are clear. IMPRESSION: No demonstrable bowel obstruction or free air. Feeding tube tip in fourth portion of duodenum. Electronically Signed   By: Lowella Grip III M.D.   On: 06/23/2017 11:50    Recent Labs  06/24/17 0532 06/25/17 0433  WBC 9.4 9.4  HGB 9.8* 9.7*  HCT 30.5* 30.5*  PLT 393 380    Recent Labs  06/23/17 1258 06/24/17 0532  NA 135 135  K 4.1 3.8  CL 103 106  GLUCOSE 191* 197*  BUN 30* 26*  CREATININE 0.82 0.92  CALCIUM 9.1 9.1   CBG (last 3)   Recent Labs  06/24/17 2004 06/24/17 2323 06/25/17 0345  GLUCAP 204* 98 151*    Wt Readings from Last 3 Encounters:  06/23/17 74.5 kg (164 lb 3.9 oz)  06/18/17 76.4 kg (168 lb 6.9 oz)  01/15/16 79.3 kg (174 lb 12.8 oz)    Physical Exam:  BP 121/69 (BP Location: Left Arm)   Pulse 75   Temp 98.6 F (37 C) (Oral)   Resp 16   Ht 5\' 4"  (1.626 m)   Wt 74.5 kg (164 lb 3.9 oz)   SpO2 100%   BMI 28.19 kg/m  Constitutional: She appears well-developed and well-nourished.  HENT: Normocephalic and atraumatic.  Eyes: EOM are normal. No discharge Neck: CFS #6 without drainage today Cardiovascular: RRR. No JVD Respiratory: normal effort. Fair flow, rhonchi heard GI: distended , reduced BS, NT  +tympany Musculoskeletal: She exhibits no edema or tenderness.  Neurological: Alert  Left facial droop with dysarthric speech.  Right sided weakness with decrease in motor control.  Right homonymous hemianopsia RUE grossly 3- to 3/5.Marland Kitchen  RLE grossly 4-/5.  Skin: Skin is warm and dry. She is not diaphoretic.  Psychiatric: affect flat, processing delayed Assessment/Plan: 1. 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 Physiatrist is providing close team supervision and 24 hour management of active medical problems listed below. Physiatrist and rehab team continue to assess barriers to discharge/monitor patient progress toward functional and medical goals.  Function:  Bathing Bathing position   Position: Wheelchair/chair at sink  Bathing parts Body parts bathed by patient: Chest, Abdomen Body parts bathed by helper: Right arm, Left arm, Front perineal area, Buttocks, Back  Bathing assist Assist Level:  (MaxA )      Upper Body Dressing/Undressing Upper body dressing   What is the patient wearing?: Pull over shirt/dress     Pull over shirt/dress - Perfomed by patient: Thread/unthread left sleeve, Put head through opening, Pull shirt over trunk Pull over shirt/dress - Perfomed by helper: Thread/unthread right sleeve         Upper body assist Assist Level: Touching or steadying assistance(Pt > 75%)   Set up : To obtain clothing/put away  Lower Body Dressing/Undressing Lower body dressing   What is the patient wearing?: Pants, Non-skid slipper socks       Pants- Performed by helper: Thread/unthread right pants leg, Thread/unthread left pants leg, Pull pants up/down   Non-skid slipper socks- Performed by helper: Don/doff right sock, Don/doff left sock                  Lower body assist Assist for lower body dressing:  (MaxA )      Toileting Toileting Toileting activity did not occur: No continent bowel/bladder event   Toileting steps completed by helper: Adjust clothing prior to toileting, Performs perineal hygiene, Adjust clothing after toileting (Total A bed level)    Toileting assist     Transfers Chair/bed transfer   Chair/bed transfer method: Stand pivot Chair/bed transfer assist level: Maximal assist (Pt 25 - 49%/lift and lower) Chair/bed transfer assistive device: Armrests     Locomotion Ambulation Ambulation activity did not occur: Safety/medical concerns         Wheelchair          Cognition Comprehension Comprehension assist level: Understands basic 50 - 74% of the time/ requires cueing 25 - 49% of the time, Understands basic 25 - 49% of the time/ requires cueing 50 - 75% of the time  Expression Expression assist level: Expresses basic 25 - 49% of the time/requires cueing 50 - 75% of the time. Uses single words/gestures.  Social Interaction Social Interaction  assist level: Interacts appropriately 25 - 49% of time - Needs frequent redirection.  Problem Solving Problem solving assist level: Solves basic 25 - 49% of the time - needs direction more than half the time to initiate, plan or complete simple activities  Memory Memory assist level: Recognizes or recalls less than 25% of the time/requires cueing greater than 75% of the time, Recognizes or recalls 25 - 49% of the  time/requires cueing 50 - 75% of the time    Medical Problem List and Plan:  1. 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 DVT Prophylaxis/Anticoagulation: Pharmaceutical: Lovenox  3. Headaches/Pain Management:   Topamax started 9/1--pt with persistent headaches   -increase to 50mg  twice a day, also on Diamox, corresponded with patient's neurologist, who managed her pseudotumor cerebri, may be able to reduce Diamox 4. Mood: LCSW to follow for evaluation and support.  5. Neuropsych: This patient is not capable of making decisions on her own behalf.  6. Skin/Wound Care: Routine pressure relief measures.  7. Fluids/Electrolytes/Nutrition: Monitor I/Os  Consults IR for PEG 8. MSSA PNA: Completed treatment 7 day course of cefazolin on 8/30. Continue to monitor with strict aspiration precautions--HOB > 30 degrees at all times.  9. VDRF: Tolerating PMSV with supervision. Secretions felt to be in part due to GERD.   -continue #6 trach until secretion mgt/swallow better 10 HTN: Monitor BP bid--on lopressor bid and catapres Vitals:   06/25/17 0431 06/25/17 0742  BP:    Pulse: 87 75  Resp: 18 16  Temp:    SpO2: 100% 100%    bp controlled 9/7, 121/69, today 11. ABLA: Improved with transfusion.   Hemoglobin 9.4 on 9/1  Continue to monitor 12. Anxiety disorder:Team to provide ego support. Daughter staying to encourage patient. Palliative following for support and placed on IV ativan tid for symptoms management--decrease to prn due to sedation.  13. T2DM: Hgb A1C-  - CBG (last 3)   Recent Labs  06/24/17 2004 06/24/17 2323 06/25/17 0345  GLUCAP 204* 98 151*  Fair control   -increase lantus to 30 u daily  -continue scheduled novolog 14. 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  Leucocytosis: persistent .Monitor for signs of infection.   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 B-MCA stenosis/L-ICA stenosis s/p revascularization: On ASA/Plavix.  19. Acute urinary retention:   Foley out  Ua+, ucx with multispecies- Resent   20.  Vomiting with distended abd, Negative KUB, IMPROVED LOS (Days) 7 A FACE TO FACE EVALUATION WAS PERFORMED  Ekansh Sherk E 06/25/2017 8:30 AM

## 2017-06-25 NOTE — Progress Notes (Signed)
Occupational Therapy Session Note  Patient Details  Name: [name] MRN: [id number] Date of Birth: 11-20-1950  Today's Date: 06/25/2017 OT Individual Time: 0814-4818 OT Individual Time Calculation (min): 70 min    Short Term Goals: Week 2:  OT Short Term Goal 1 (Week 2): Pt will complete toilet transfer with max A in order to decrease burden of care OT Short Term Goal 2 (Week 2): Pt will recall and utilize hemi dressing technique with 1 VC OT Short Term Goal 3 (Week 2): Pt will complete sit <> stand at sink with max A in prep for LB   Skilled Therapeutic Interventions/Progress Updates:    Pt seen for OT session focusing on ADL re-training with functional sitting balance and ADL re-training. Pt in supine upon arrival, with encouragement agreeable to tx session. Following deep suctioning by RN, pt able to tolerate PMSV throughout session and maintain O2 > 97% on RA throughout session. She transferred to EOB with max A. Pt able to maintain static and dynamic sitting balance EOB with close supervision during bathing task. She required increased cuing and assist for clothing management/ orientation to don overhead shirt.  She stood with +2 max A from elevated EOB to stand into STEDY in order to pull pants up and complete transfer to w/c.  In BI gym, pt completed dynavision activity focusing on forced use of R UE. Used lower R quadrant only, pt required to come forward from back of w/c and activate light with R UE. Pt able to use R UE at gross level demonstrating shoulder flexion and extension movements independently though fatigued quickly. Completed x2 trials on R and x1 trial using L UE, sitting unsupported in w/c and reaching across midline and outside BOS in order to activate light. She required min cuing for scanning techniques. Pt returned to room at end of session, left seated in w/c awaiting next therapist with all needs in reach and daughter present.   Therapy  Documentation Precautions:  Precautions Precautions: Fall Precaution Comments: cortrak, trach Restrictions Weight Bearing Restrictions: No Pain:   No/ denies pain  See Function Navigator for Current Functional Status.   Therapy/Group: Individual Therapy  Lewis, Anikin Prosser C 06/25/2017, 7:07 AM

## 2017-06-25 NOTE — Progress Notes (Signed)
Physical Therapy Session Note  Patient Details  Name: [name] MRN: [id number] Date of Birth: 1951-01-18  Today's Date: 06/25/2017     Short Term Goals: Week 1:  PT Short Term Goal 1 (Week 1): Pt will transfer with max assist PT Short Term Goal 2 (Week 1): Pt will initiate gait training with PT PT Short Term Goal 3 (Week 1): Pt will tolerate out of bed in w/c x2 hours outside of therapy time.   Skilled Therapeutic Interventions/Progress Updates:   PT attempted to see pt to treatment. Pt off unit for PEG tube placement. Will re-attempt at later time/date.      Therapy Documentation Precautions:  Precautions Precautions: Fall Precaution Comments: cortrak, trach Restrictions Weight Bearing Restrictions: No General:   Missed Time: 60 min(off unit) Vital Signs: Therapy Vitals Pulse Rate: 77 Resp: 16 Patient Position (if appropriate): Lying Oxygen Therapy SpO2: 100 % O2 Device: Tracheostomy Collar O2 Flow Rate (L/min): 5 L/min FiO2 (%): 28 %  See Function Navigator for Current Functional Status.   Therapy/Group: Individual Therapy  Lorie Phenix 06/25/2017, 2:38 PM

## 2017-06-25 NOTE — Sedation Documentation (Signed)
Patient is resting comfortably. 

## 2017-06-26 ENCOUNTER — Inpatient Hospital Stay (HOSPITAL_COMMUNITY): Payer: Medicare Other | Admitting: Speech Pathology

## 2017-06-26 LAB — GLUCOSE, CAPILLARY
GLUCOSE-CAPILLARY: 106 mg/dL — AB (ref 65–99)
GLUCOSE-CAPILLARY: 145 mg/dL — AB (ref 65–99)
GLUCOSE-CAPILLARY: 186 mg/dL — AB (ref 65–99)
Glucose-Capillary: 87 mg/dL (ref 65–99)
Glucose-Capillary: 122 mg/dL — ABNORMAL HIGH (ref 65–99)
Glucose-Capillary: 228 mg/dL — ABNORMAL HIGH (ref 65–99)

## 2017-06-26 MED ORDER — DEXTROSE-NACL 5-0.45 % IV SOLN
INTRAVENOUS | Status: DC
  Administered 2017-06-26: 01:00:00 via INTRAVENOUS

## 2017-06-26 MED ORDER — ALTEPLASE 2 MG IJ SOLR
2.0000 mg | Freq: Once | INTRAMUSCULAR | Status: AC
  Administered 2017-06-26: 2 mg
  Filled 2017-06-26 (×2): qty 2

## 2017-06-26 NOTE — Progress Notes (Addendum)
CBG 0000 [id number], called on call (see order), [phone] only administered 4 units. Pt is able to make needs known. Pt assist with suctioning by raising her left arm, if suctioning to deep.  Pt can follow directions. Pt needs reassurance.

## 2017-06-26 NOTE — Progress Notes (Signed)
Patient ID: [name], female   DOB: 1951-04-11, 66 y.o.   MRN: [id number]   06/26/17.  [name] is a 66 y.o. 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 Anemia    takes iron supplement  . Anesthesia complication    disorientation due to pseudotumor  . Diabetes mellitus    IDDM  . Family history of anesthesia complication 69 yrs ago   brother stopped breathing for a minute or two  . GERD (gastroesophageal reflux disease)   . Headache(784.0)    due to pseudotumor; daily headache  . Hypercholesteremia    unable to tolerate statins  . Hypertension    under control with meds., has been on med. x 5 yr.  . Peripheral neuropathy   . PONV (postoperative nausea and vomiting)   . 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 Synovitis of ankle 04/2013   left  . Wears dentures    full     Subjective: No new complaints. No new problems. Continues to tolerate NGT feedings.  S/p PEG yesterday Remains on IV Vanco  Objective: Vital signs in last 24 hours: Pulse Rate:  [70-109] 83 (09/08 0821) Resp:  [12-19] 16 (09/08 0821) BP: (126-178)/(51-152) 126/51 (09/08 0446) SpO2:  [97 %-100 %] 97 % (09/08 0821) FiO2 (%):  [28 %] 28 % (09/08 0821) Weight:  [162 lb 14.7 oz (73.9 kg)] 162 lb 14.7 oz (73.9 kg) (09/08 0500) Weight change:  Last BM Date: 06/21/17  Intake/Output from previous day: 09/07 0701 - 09/08 0700 In: 3320.8 [I.V.:3320.8] Out: -  Last cbgs: CBG (last 3)   Recent Labs  06/26/17 0227 06/26/17 0512 06/26/17 0850  GLUCAP 87 122* 106*   BP Readings from Last 3 Encounters:  06/26/17 (!) 126/51  06/18/17 (!) 142/80  07/13/16 139/78   Physical Exam General: No apparent distress   HEENT: NGT in place; Trach in place  Lungs:  Normal effort. Lungs upper airway rhonchi, no crackles or wheezes. Cardiovascular: Regular rate and rhythm, no edema Abdomen: S/NT/ND; BS(+) Musculoskeletal:  unchanged Neurological: follows commands; R HP with R HH Mental state: Alert, oriented, cooperative    Lab Results: BMET    Component Value Date/Time   NA 138 06/25/2017 1234   NA 137 01/15/2016 1632   K 3.7 06/25/2017 1234   CL 107 06/25/2017 1234   CO2 22 06/25/2017 1234   GLUCOSE 173 (H) 06/25/2017 1234   BUN 28 (H) 06/25/2017 1234   BUN 14 01/15/2016 1632   CREATININE 0.96 06/25/2017 1234   CALCIUM 9.1 06/25/2017 1234   GFRNONAA >60 06/25/2017 1234   GFRAA >60 06/25/2017 1234   CBC    Component Value Date/Time   WBC 9.4 06/25/2017 0433   RBC 3.45 (L) 06/25/2017 0433   HGB 9.7 (L) 06/25/2017 0433   HCT 30.5 (L) 06/25/2017 0433   PLT 380 06/25/2017 0433   MCV 88.4 06/25/2017 0433   MCH 28.1 06/25/2017 0433   MCHC 31.8 06/25/2017 0433   RDW 15.1 06/25/2017 0433   LYMPHSABS 1.2 06/23/2017 1258   MONOABS 0.6 06/23/2017 1258   EOSABS 0.2 06/23/2017 1258   BASOSABS 0.0 06/23/2017 1258   CBG (last 3)   Recent Labs  06/26/17 0227 06/26/17 5053 06/26/17 9767  GLUCAP 87 122* 106*     Studies/Results: Ir Gastrostomy Tube Mod Sed  Result Date: 06/25/2017 INDICATION: 66 year old female with dysphagia due to recent stroke EXAM: Fluoroscopically guided placement of percutaneous pull-through gastrostomy tube Interventional Radiologist:  Criselda Peaches, MD MEDICATIONS: 1 g vancomycin; Antibiotics were administered within 1 hour of the procedure. 1 mg glucagon was also administered intravenously. ANESTHESIA/SEDATION: Versed 2 mg IV; Fentanyl 100 mcg IV Moderate Sedation Time:  15 minutes The patient was continuously monitored during the procedure by the interventional radiology nurse under my direct supervision. CONTRAST:  10 mL Isovue-300 FLUOROSCOPY TIME:  Fluoroscopy Time: 4 minutes 12 seconds (18 mGy).  COMPLICATIONS: None immediate. PROCEDURE: Informed written consent was obtained from the patient after a thorough discussion of the procedural risks, benefits and alternatives. All questions were addressed. Maximal Sterile Barrier Technique was utilized including caps, mask, sterile gowns, sterile gloves, sterile drape, hand hygiene and skin antiseptic. A timeout was performed prior to the initiation of the procedure. Maximal barrier sterile technique utilized including caps, mask, sterile gowns, sterile gloves, large sterile drape, hand hygiene, and chlorhexadine skin prep. An angled catheter was advanced over a wire under fluoroscopic guidance through the nose, down the esophagus and into the body of the stomach. The stomach was then insufflated with several 100 ml of air. Fluoroscopy confirmed location of the gastric bubble, as well as inferior displacement of the barium stained colon. Under direct fluoroscopic guidance, a single T-tack was placed, and the anterior gastric wall drawn up against the anterior abdominal wall. Percutaneous access was then obtained into the mid gastric body with an 18 gauge sheath needle. Aspiration of air, and injection of contrast material under fluoroscopy confirmed needle placement. An Amplatz wire was advanced in the gastric body and the access needle exchanged for a 9-French vascular sheath. A snare device was advanced through the vascular sheath and an Amplatz wire advanced through the angled catheter. The Amplatz wire was successfully snared and this was pulled up through the esophagus and out the mouth. A 20-French Alinda Dooms MIC-PEG tube was then connected to the snare and pulled through the mouth, down the esophagus, into the stomach and out to the anterior abdominal wall. Hand injection of contrast material confirmed intragastric location. The T-tack retention suture was then cut. The pull through peg tube was then secured with the external bumper and capped. The  patient will be observed for several hours with the newly placed tube on low wall suction to evaluate for any post procedure complication. The patient tolerated the procedure well, there is no immediate complication. IMPRESSION: Successful placement of a 20 French pull through gastrostomy tube. Electronically Signed   By: Jacqulynn Cadet M.D.   On: 06/25/2017 17:07    Medications: I have reviewed the patient's current medications.  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  Continue TF. PEG placed yesterday HTN. Stable DM- nice glycemic control   Length of stay, days: Justin , MD 06/26/2017, 10:00 AM

## 2017-06-26 NOTE — Progress Notes (Signed)
Speech Language Pathology Daily Session Note  Patient Details  Name: [name] MRN: [id number] Date of Birth: August 07, 1951  Today's Date: 06/26/2017 SLP Individual Time: 1040-1050 SLP Individual Time Calculation (min): 10 min  Short Term Goals: Week 2: SLP Short Term Goal 1 (Week 2): Pt will consume trials of honey thick via spoon and puree with minimal overt s/s of aspiration to demonstrate readiness for repeat instrumental swallow study. SLP Short Term Goal 2 (Week 2): Pt will increase vocal intensity with Mod A cues to achieve ~ 90% intelligiblity at the phrase level.  SLP Short Term Goal 3 (Week 2): Pt will increase sustained attention to task for ~ 20 minutes with Mod A cues.  SLP Short Term Goal 4 (Week 2): Pt will demonstrate basic problem solving for familiar tasks with Mod A cues.   Skilled Therapeutic Interventions: Skilled treatment session focused on speech goals. Upon arrival, RN was administering medications via the PEG and PMSV was donned. Patient required Mod verbal cues for utilization of an increased vocal intensity to achieve ~75% intelligible at the sentence level and appeared more confused this session. During the session, patient's son and daughter-in-law arrived and began asking RN multiple questions about medical issues, medications, etc 2/2 to patient reporting she doesn't need the current medications, doesn't know what they are for and is not involved in decision making. Attempted to educate family in regards to patient's current cognitive impairments and their impact on awareness, recall and reasoning. However, they will need reinforcement. Session was ended early due to multiple distractions impacting patient's ability to participate. Patient left upright in bed with PMSV on and RN/family in room. Continue with current plan of care.      Function:  Eating Eating   Modified Consistency Diet: Yes Eating Assist Level: Helper performs IV, parenteral or tube feed            Cognition Comprehension Comprehension assist level: Understands basic 50 - 74% of the time/ requires cueing 25 - 49% of the time  Expression   Expression assist level: Expresses basic 50 - 74% of the time/requires cueing 25 - 49% of the time. Needs to repeat parts of sentences.  Social Interaction Social Interaction assist level: Interacts appropriately 25 - 49% of time - Needs frequent redirection.  Problem Solving Problem solving assist level: Solves basic 25 - 49% of the time - needs direction more than half the time to initiate, plan or complete simple activities  Memory Memory assist level: Recognizes or recalls 25 - 49% of the time/requires cueing 50 - 75% of the time    Pain No/Denies Pain   Therapy/Group: Individual Therapy  Murl Zogg, [place] 06/26/2017, 3:12 PM

## 2017-06-27 LAB — GLUCOSE, CAPILLARY
GLUCOSE-CAPILLARY: 110 mg/dL — AB (ref 65–99)
GLUCOSE-CAPILLARY: 142 mg/dL — AB (ref 65–99)
GLUCOSE-CAPILLARY: 145 mg/dL — AB (ref 65–99)
GLUCOSE-CAPILLARY: 162 mg/dL — AB (ref 65–99)
GLUCOSE-CAPILLARY: 175 mg/dL — AB (ref 65–99)
GLUCOSE-CAPILLARY: 213 mg/dL — AB (ref 65–99)
Glucose-Capillary: 272 mg/dL — ABNORMAL HIGH (ref 65–99)

## 2017-06-27 NOTE — Progress Notes (Signed)
Patient ID: [name], female   DOB: 1951/02/19, 66 y.o.   MRN: [id number]   06/27/17.  [name] is a 66 y.o. 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 Anemia    takes iron supplement  . Anesthesia complication    disorientation due to pseudotumor  . Diabetes mellitus    IDDM  . Family history of anesthesia complication 31 yrs ago   brother stopped breathing for a minute or two  . GERD (gastroesophageal reflux disease)   . Headache(784.0)    due to pseudotumor; daily headache  . Hypercholesteremia    unable to tolerate statins  . Hypertension    under control with meds., has been on med. x 5 yr.  . Peripheral neuropathy   . PONV (postoperative nausea and vomiting)   . 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 Synovitis of ankle 04/2013   left  . Wears dentures    full     Subjective:  No new problems. Tolerating TF; s/p PEG 2 days ago  Objective: Vital signs in last 24 hours: Temp:  [97.9 F (36.6 C)-98 F (36.7 C)] 97.9 F (36.6 C) (09/09 0446) Pulse Rate:  [65-89] 80 (09/09 0446) Resp:  [14-18] 18 (09/09 0446) BP: (125-141)/(50-70) 133/70 (09/09 0446) SpO2:  [98 %-100 %] 100 % (09/09 0925) FiO2 (%):  [28 %] 28 % (09/09 0925) Weight:  [160 lb (72.6 kg)] 160 lb (72.6 kg) (09/09 0446) Weight change: -2 lb 14.7 oz (-1.325 kg) Last BM Date: 06/21/17  Intake/Output from previous day: 09/08 0701 - 09/09 0700 In: 200 [NG/GT:200] Out: -  Last cbgs: CBG (last 3)   Recent Labs  06/26/17 2355 06/27/17 0443 06/27/17 0800  GLUCAP 162* 272* 145*   BP Readings from Last 3 Encounters:  06/27/17 133/70  06/18/17 (!) 142/80  07/13/16 139/78    Physical Exam General: No apparent distress   HEENT:trach in place Lungs:  Normal effort. Lungs clear to auscultation-rhonchi have resolved; O2 sat 100% Cardiovascular: Regular rate and rhythm, no edema Abdomen: S/NT/ND; BS(+) Musculoskeletal:  unchanged Neurological: Follows commands; R HP with R HH Wounds: N/A    Skin: clear  Aging changes Mental state: Alert,     Lab Results: BMET    Component Value Date/Time   NA 138 06/25/2017 1234   NA 137 01/15/2016 1632   K 3.7 06/25/2017 1234   CL 107 06/25/2017 1234   CO2 22 06/25/2017 1234   GLUCOSE 173 (H) 06/25/2017 1234   BUN 28 (H) 06/25/2017 1234   BUN 14 01/15/2016 1632   CREATININE 0.96 06/25/2017 1234   CALCIUM 9.1 06/25/2017 1234   GFRNONAA >60 06/25/2017 1234   GFRAA >60 06/25/2017 1234   CBC    Component Value Date/Time   WBC 9.4 06/25/2017 0433   RBC 3.45 (L) 06/25/2017 0433   HGB 9.7 (L) 06/25/2017 0433   HCT 30.5 (L) 06/25/2017 0433   PLT 380 06/25/2017 0433   MCV 88.4 06/25/2017 0433   MCH 28.1 06/25/2017 0433   MCHC 31.8 06/25/2017 0433   RDW 15.1 06/25/2017 0433   LYMPHSABS 1.2 06/23/2017 1258   MONOABS 0.6 06/23/2017 1258   EOSABS 0.2 06/23/2017 1258   BASOSABS 0.0 06/23/2017 1258    Studies/Results: Ir  Gastrostomy Tube Mod Sed  Result Date: 06/25/2017 INDICATION: 66 year old female with dysphagia due to recent stroke EXAM: Fluoroscopically guided placement of percutaneous pull-through gastrostomy tube Interventional Radiologist:  Criselda Peaches, MD MEDICATIONS: 1 g vancomycin; Antibiotics were administered within 1 hour of the procedure. 1 mg glucagon was also administered intravenously. ANESTHESIA/SEDATION: Versed 2 mg IV; Fentanyl 100 mcg IV Moderate Sedation Time:  15 minutes The patient was continuously monitored during the procedure by the interventional radiology nurse under my direct supervision. CONTRAST:  10 mL Isovue-300 FLUOROSCOPY TIME:  Fluoroscopy Time: 4 minutes 12 seconds (18 mGy). COMPLICATIONS: None immediate. PROCEDURE: Informed written consent was  obtained from the patient after a thorough discussion of the procedural risks, benefits and alternatives. All questions were addressed. Maximal Sterile Barrier Technique was utilized including caps, mask, sterile gowns, sterile gloves, sterile drape, hand hygiene and skin antiseptic. A timeout was performed prior to the initiation of the procedure. Maximal barrier sterile technique utilized including caps, mask, sterile gowns, sterile gloves, large sterile drape, hand hygiene, and chlorhexadine skin prep. An angled catheter was advanced over a wire under fluoroscopic guidance through the nose, down the esophagus and into the body of the stomach. The stomach was then insufflated with several 100 ml of air. Fluoroscopy confirmed location of the gastric bubble, as well as inferior displacement of the barium stained colon. Under direct fluoroscopic guidance, a single T-tack was placed, and the anterior gastric wall drawn up against the anterior abdominal wall. Percutaneous access was then obtained into the mid gastric body with an 18 gauge sheath needle. Aspiration of air, and injection of contrast material under fluoroscopy confirmed needle placement. An Amplatz wire was advanced in the gastric body and the access needle exchanged for a 9-French vascular sheath. A snare device was advanced through the vascular sheath and an Amplatz wire advanced through the angled catheter. The Amplatz wire was successfully snared and this was pulled up through the esophagus and out the mouth. A 20-French Alinda Dooms MIC-PEG tube was then connected to the snare and pulled through the mouth, down the esophagus, into the stomach and out to the anterior abdominal wall. Hand injection of contrast material confirmed intragastric location. The T-tack retention suture was then cut. The pull through peg tube was then secured with the external bumper and capped. The patient will be observed for several hours with the newly placed tube on low  wall suction to evaluate for any post procedure complication. The patient tolerated the procedure well, there is no immediate complication. IMPRESSION: Successful placement of a 20 French pull through gastrostomy tube. Electronically Signed   By: Jacqulynn Cadet M.D.   On: 06/25/2017 17:07    Medications: I have reviewed the patient's current medications.  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  Continue TF. PEG placed 2 days ago HTN. Remains Stable DM- nice glycemic control   Length of stay, days: [place] , MD 06/27/2017, 9:39 AM

## 2017-06-27 NOTE — Progress Notes (Signed)
Called to room by family. Pt lying in bed after visiting with several visitors and reported that she felt like she was "going to pass out". O2 sat 100, P76, able to answer questions however reported she was disoriented and wanted to be able to answer questions from family. Felt tired and kept drifting off to sleep. Noted that she felt she should be more alert however she has been awake most of the day and has had visitors for awhile that may have overwhelmed her. VSS, no acute distress noted, PMSV removed, encouraged pt to rest. Noted that she had no acute pain, SOB or otherwise issue and could not specifically state why she felt "uneasy", just "eifferent" but was unable to explain what that meant, No other changes noted. Margarito Liner

## 2017-06-27 NOTE — Plan of Care (Signed)
Problem: RH BOWEL ELIMINATION Goal: RH STG MANAGE BOWEL WITH ASSISTANCE STG Manage Bowel with min Assistance.  Outcome: Not Progressing No BM>3 days Goal: RH STG MANAGE BOWEL W/MEDICATION W/ASSISTANCE STG Manage Bowel with Medication with min Assistance.  Outcome: Not Progressing No BM > 3 days

## 2017-06-28 ENCOUNTER — Inpatient Hospital Stay (HOSPITAL_COMMUNITY): Payer: Medicare Other | Admitting: Occupational Therapy

## 2017-06-28 ENCOUNTER — Inpatient Hospital Stay (HOSPITAL_COMMUNITY): Payer: Medicare Other | Admitting: Speech Pathology

## 2017-06-28 ENCOUNTER — Inpatient Hospital Stay (HOSPITAL_COMMUNITY): Payer: Medicare Other

## 2017-06-28 LAB — GLUCOSE, CAPILLARY
GLUCOSE-CAPILLARY: 185 mg/dL — AB (ref 65–99)
GLUCOSE-CAPILLARY: 223 mg/dL — AB (ref 65–99)
GLUCOSE-CAPILLARY: 56 mg/dL — AB (ref 65–99)
Glucose-Capillary: 181 mg/dL — ABNORMAL HIGH (ref 65–99)
Glucose-Capillary: 193 mg/dL — ABNORMAL HIGH (ref 65–99)
Glucose-Capillary: 203 mg/dL — ABNORMAL HIGH (ref 65–99)
Glucose-Capillary: 209 mg/dL — ABNORMAL HIGH (ref 65–99)

## 2017-06-28 MED ORDER — ACETAZOLAMIDE 250 MG PO TABS
250.0000 mg | ORAL_TABLET | Freq: Two times a day (BID) | ORAL | Status: DC
  Administered 2017-06-28 – 2017-06-29 (×2): 250 mg
  Filled 2017-06-28 (×2): qty 1

## 2017-06-28 MED ORDER — INSULIN GLARGINE 100 UNIT/ML ~~LOC~~ SOLN
30.0000 [IU] | Freq: Every day | SUBCUTANEOUS | Status: DC
  Administered 2017-06-29 – 2017-06-30 (×2): 30 [IU] via SUBCUTANEOUS
  Filled 2017-06-28 (×2): qty 0.3

## 2017-06-28 MED ORDER — DIPHENHYDRAMINE HCL 12.5 MG/5ML PO ELIX: 12.5000 mg | ORAL_SOLUTION | Freq: Four times a day (QID) | ORAL | Status: DC | PRN

## 2017-06-28 MED ORDER — PANTOPRAZOLE SODIUM 40 MG PO PACK
40.0000 mg | PACK | Freq: Two times a day (BID) | ORAL | Status: DC
  Administered 2017-06-28 – 2017-07-05 (×13): 40 mg
  Filled 2017-06-28 (×9): qty 20

## 2017-06-28 MED ORDER — PROCHLORPERAZINE MALEATE 5 MG PO TABS
5.0000 mg | ORAL_TABLET | Freq: Three times a day (TID) | ORAL | Status: DC
  Administered 2017-06-28 – 2017-07-06 (×25): 5 mg
  Filled 2017-06-28 (×27): qty 1

## 2017-06-28 NOTE — Progress Notes (Signed)
Occupational Therapy Session Note  Patient Details  Name: [name] MRN: [id number] Date of Birth: Aug 07, 1951  Today's Date: 06/28/2017 OT Individual Time: 2761-4709 OT Individual Time Calculation (min): 15 min  and Today's Date: 06/28/2017 OT Missed Time: 60 Minutes Missed Time Reason: Patient fatigue;Patient unwilling/refused to participate without medical reason   Short Term Goals: Week 2:  OT Short Term Goal 1 (Week 2): Pt will complete toilet transfer with max A in order to decrease burden of care OT Short Term Goal 2 (Week 2): Pt will recall and utilize hemi dressing technique with 1 VC OT Short Term Goal 3 (Week 2): Pt will complete sit <> stand at sink with max A in prep for LB   Skilled Therapeutic Interventions/Progress Updates:    Therapist arrived for scheduled session, pt in bed. Donned PMSV and pt began crying out she was nauseas and needing assist. Comfort provided and RN made aware. Returned to pt and educated regarding recommendations of nurse (get OOB for upright positioning and RN to complete suction). Pt then began cursing, threw emesis bag across room and screaming "lies, these are all lies". Attempted to provide therapeutic listening and encouragement, however, pt cont to be agitated. Educated regarding benefits of OOB and participation with ADLs, however, pt cont to refusing and requesting therapist exit. Pt left in supine at end of session, PMSV removed and all needs in reach. Will attempt to make up time as schedule allows.   Therapy Documentation Precautions:  Precautions Precautions: Fall Precaution Comments: cortrak, trach Restrictions Weight Bearing Restrictions: No  See Function Navigator for Current Functional Status.   Therapy/Group: Individual Therapy  Lewis, Bader Stubble[place] C 06/28/2017, 7:09 AM

## 2017-06-28 NOTE — Progress Notes (Signed)
[facility] PHYSICAL MEDICINE & REHABILITATION     PROGRESS NOTE  Subjective/Complaints:   PEG placed, pt wants to go home still has #6 shiley trach Refused OT this am We discussed that in order to go home , needs to get stronger.  Increased mobility will also increase likelihood of decannulation ROS: limited  Objective:  Vital Signs: Blood pressure (!) 121/51, pulse 72, temperature 98.4 F (36.9 C), temperature source Oral, resp. rate 16, height 5\' 4"  (1.626 m), weight 74.8 kg (164 lb 14.5 oz), SpO2 100 %. No results found. No results for input(s): WBC, HGB, HCT, PLT in the last 72 hours.  Recent Labs  06/25/17 1234  NA 138  K 3.7  CL 107  GLUCOSE 173*  BUN 28*  CREATININE 0.96  CALCIUM 9.1   CBG (last 3)   Recent Labs  06/27/17 1940 06/27/17 2353 06/28/17 0447  GLUCAP 142* 110* 223*    Wt Readings from Last 3 Encounters:  06/28/17 74.8 kg (164 lb 14.5 oz)  06/18/17 76.4 kg (168 lb 6.9 oz)  01/15/16 79.3 kg (174 lb 12.8 oz)    Physical Exam:  BP (!) 121/51 (BP Location: Left Arm)   Pulse 72   Temp 98.4 F (36.9 C) (Oral)   Resp 16   Ht 5\' 4"  (1.626 m)   Wt 74.8 kg (164 lb 14.5 oz)   SpO2 100%   BMI 28.31 kg/m  Constitutional: She appears well-developed and well-nourished.  HENT: Normocephalic and atraumatic.  Eyes: EOM are normal. No discharge Neck: CFS #6 without drainage today Cardiovascular: RRR. No JVD Respiratory: normal effort. Fair flow, rhonchi heard GI: distended , reduced BS, NT  +tympany Musculoskeletal: She exhibits no edema or tenderness.  Neurological: Alert  Left facial droop with dysarthric speech.  Right sided weakness with decrease in motor control.  Right homonymous hemianopsia RUE grossly 3- to 3/5.Marland Kitchen  RLE grossly 4-/5.  Skin: Skin is warm and dry. She is not diaphoretic.  Psychiatric: affect flat, processing delayed Assessment/Plan: 1. 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 Physiatrist is providing close team supervision and 24 hour management of active medical problems listed below. Physiatrist and rehab team continue to assess barriers to discharge/monitor patient progress toward functional and medical goals.  Function:  Bathing Bathing position   Position: Wheelchair/chair at sink  Bathing parts Body parts bathed by patient: Chest, Abdomen Body parts bathed by helper: Right arm, Left arm, Front perineal area, Buttocks, Back  Bathing assist Assist Level:  (MaxA )      Upper Body Dressing/Undressing Upper body dressing   What is the patient wearing?: Pull over shirt/dress     Pull over shirt/dress - Perfomed by patient: Thread/unthread left sleeve, Put head through opening, Pull shirt over trunk Pull over shirt/dress - Perfomed by helper: Thread/unthread right sleeve        Upper body assist Assist Level: Touching or steadying assistance(Pt > 75%)   Set up : To obtain clothing/put away  Lower Body Dressing/Undressing Lower body dressing   What is the patient wearing?: Pants, Non-skid slipper socks       Pants- Performed by helper: Thread/unthread right pants leg, Thread/unthread left pants leg, Pull pants up/down   Non-skid slipper socks- Performed by helper: Don/doff right sock, Don/doff left sock  Lower body assist Assist for lower body dressing: 2 Helpers      Toileting Toileting Toileting activity did not occur: Refused   Toileting steps completed by helper: Adjust clothing prior to toileting, Performs perineal hygiene, Adjust clothing after toileting    Toileting assist     Transfers Chair/bed transfer   Chair/bed transfer method: Other Chair/bed transfer assist level: 2 helpers Chair/bed transfer assistive device: Mechanical lift Mechanical lift: Ecologist Ambulation  activity did not occur: Safety/medical concerns         Wheelchair          Cognition Comprehension Comprehension assist level: Understands basic 50 - 74% of the time/ requires cueing 25 - 49% of the time  Expression Expression assist level: Expresses basic 50 - 74% of the time/requires cueing 25 - 49% of the time. Needs to repeat parts of sentences.  Social Interaction Social Interaction assist level: Interacts appropriately 25 - 49% of time - Needs frequent redirection.  Problem Solving Problem solving assist level: Solves basic 25 - 49% of the time - needs direction more than half the time to initiate, plan or complete simple activities  Memory Memory assist level: Recognizes or recalls 25 - 49% of the time/requires cueing 50 - 75% of the time    Medical Problem List and Plan:  1. 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 DVT Prophylaxis/Anticoagulation: Pharmaceutical: Lovenox  3. Headaches/Pain Management:   Topamax started 9/1--pt with persistent headaches   -increase to 50mg  twice a day, also on Diamox, per Dr Jaynee Eagles will  reduce Diamox 4. Mood: LCSW to follow for evaluation and support.  5. Neuropsych: This patient is not capable of making decisions on her own behalf.  6. Skin/Wound Care: Routine pressure relief measures.  7. Fluids/Electrolytes/Nutrition: Monitor I/Os  Consults IR for PEG 8. MSSA PNA: Completed treatment 7 day course of cefazolin on 8/30. Continue to monitor with strict aspiration precautions--HOB > 30 degrees at all times.  9. VDRF: Tolerating PMSV with supervision. Secretions felt to be in part due to GERD.   -continue #6 trach until secretion mgt/swallow better 10 HTN: Monitor BP bid--on lopressor bid and catapres Vitals:   06/28/17 0449 06/28/17 0833  BP: (!) 121/51   Pulse: 69 72   Resp: 16 16  Temp: 98.4 F (36.9 C)   SpO2: 100% 100%    bp controlled 9/7, 121/69, today 11. ABLA: Improved with transfusion.   Hemoglobin 9.4 on 9/1  Continue to monitor 12. Anxiety disorder:Team to provide ego support. Daughter staying to encourage patient. Palliative following for support and placed on IV ativan tid for symptoms management--decrease to prn due to sedation.  13. T2DM: Hgb A1C-  - CBG (last 3)   Recent Labs  06/27/17 1940 06/27/17 2353 06/28/17 0447  GLUCAP 142* 110* 223*  Fair control   -increase lantus to 30 u daily, on TF after PEG  -continue scheduled novolog 14. 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 Leucocytosis: persistent .Monitor for signs of infection.   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 B-MCA stenosis/L-ICA stenosis s/p revascularization: On ASA/Plavix.  69. Acute urinary retention:  Foley out  Ua+, ucx with multispecies- Resent   20.  Vomiting with distended abd, Negative KUB, IMPROVED LOS (Days) 10 A FACE TO FACE EVALUATION WAS PERFORMED  KIRSTEINS,ANDREW E 06/28/2017 9:04 AM

## 2017-06-28 NOTE — Progress Notes (Signed)
Physical Therapy Weekly Progress Note  Patient Details  Name: Myley C Yarde MRN: [id number] Date of Birth: 06/28/1951  Beginning of progress report period: June 19, 2017 End of progress report period: June 28, 2017  Today's Date: 06/28/2017 PT Individual Time: 1300-1325 PT Individual Time Calculation (min): 25 min   Patient has met 0 of 3 short term goals.  Pt has made slower than anticipated progress this weeks due to various medical and GI issues that have limited pt's tolerance to participation in therapy. When pt is able she currently demonstrates bed mobility and sit to stand transfers at maxA. Continued pt and family education has been evident to encourage pt's OOB activity and overall progression during therapy.   Patient continues to demonstrate the following deficits muscle weakness and muscle paralysis, decreased cardiorespiratoy endurance, impaired timing and sequencing, unbalanced muscle activation, motor apraxia, decreased coordination and decreased motor planning and decreased attention, decreased problem solving and decreased safety awareness and therefore will continue to benefit from skilled PT intervention to increase functional independence with mobility.  Patient progressing toward long term goals..  Continue plan of care.  PT Short Term Goals Week 1:  PT Short Term Goal 1 (Week 1): Pt will transfer with max assist PT Short Term Goal 1 - Progress (Week 1): Progressing toward goal PT Short Term Goal 2 (Week 1): Pt will initiate gait training with PT PT Short Term Goal 2 - Progress (Week 1): Not met PT Short Term Goal 3 (Week 1): Pt will tolerate out of bed in w/c x2 hours outside of therapy time.  PT Short Term Goal 3 - Progress (Week 1): Progressing toward goal Week 2:     Skilled Therapeutic Interventions/Progress Updates: Pt presented in bed indicated does not want to participate due to "procedure" being done (pt to have trach size decreased this pm, ok  per nsg to perform any activities during therapy).  Pt saying her "DIL put a curse on me" then expressing that she can't sit up without getting dizzy and/or nauseous. Pt eventually agreeable to perform sitting EOB with max encouragement. Pt able to roll to R minA, then requiring maxA supine to sit with HOB elevated. Upon sitting pt c/o "burning" at PEG tube site and expressing unable to tolerate sitting and wants to return to supine. Pt falling back into bed. Just wants to lay down and not participate in any further activity. Pt left in bed with alarm on and needs left with nsg notified.      Therapy Documentation Precautions:  Precautions Precautions: Fall Precaution Comments: cortrak, trach Restrictions Weight Bearing Restrictions: No General: PT Amount of Missed Time (min): 35 Minutes PT Missed Treatment Reason: Patient unwilling to participate;Pain Vital Signs: Therapy Vitals Pulse Rate: 80 Resp: 20 BP: (!) 130/58 Patient Position (if appropriate): Lying Oxygen Therapy SpO2: 100 % O2 Device: Tracheostomy Collar O2 Flow Rate (L/min): 5 L/min FiO2 (%): 28 % Pain: Pain Assessment Pain Assessment: 0-10 Pain Score: 9  Pain Type: Acute pain Pain Location: Head Pain Descriptors / Indicators: Headache Pain Frequency: Occasional Pain Onset: Gradual Patients Stated Pain Goal: 3 Pain Intervention(s): Medication (See eMAR);Emotional support;Relaxation (tylenol 650 mg po)  See Function Navigator for Current Functional Status.  Therapy/Group: Individual Therapy  Rosita DeChalus  Rosita DeChalus, PTA  06/28/2017, 1:56 PM  

## 2017-06-28 NOTE — Progress Notes (Signed)
Nutrition Follow-up  DOCUMENTATION CODES:   Not applicable  INTERVENTION:  Continue Vital 1.5 formula via PEG at goal rate of 60 ml/hr x 20 hours (may hold TF for up to 4 hours for therapy) with 30 ml Prostat once daily to provide 1900 kcal, 96 grams of protein, and 912 ml of water.  Continue free water flushes 200 ml QID per tube. Total free water: 1712 ml/day.  NUTRITION DIAGNOSIS:   Inadequate oral intake related to inability to eat, dysphagia (Stroke) as evidenced by NPO status; ongoing  GOAL:   Patient will meet greater than or equal to 90% of their needs; met  MONITOR:   TF tolerance, Skin, Labs, Weight trends, I & O's  REASON FOR ASSESSMENT:   Consult Enteral/tube feeding initiation and management  ASSESSMENT:   66yo female with history of DM, HTN, Peripheral neuropathy, HLD, pseudotumor cerebri, admitted on 8/13 with acute R hemispheric stroke s/p thrombectomy 8/16, trached 8/23 MBS done 8/30 showed dysphagia and silent aspiration, continues to be NPO. Presents to rehab with R hemiparesis, functional and cognitive deficits.  PEG placed 9/7. Pt with nausea last night. During time of visit this AM, pt reports her stomach has been feeling better. RD to continue with Vital 1.5 formula product with continuous infusion as pt with history of gastroparesis and history of unable to tolerate other formula (Vital AF 1.2 formula) in the past.   Diet Order:   NPO  Skin:   (R groin incision)  Last BM:  9/8  Height:   Ht Readings from Last 1 Encounters:  06/18/17 _0  (1.626 m)    Weight:   Wt Readings from Last 1 Encounters:  06/28/17 164 lb 14.5 oz (74.8 kg)    Ideal Body Weight:  54.5 kg  BMI:  Body mass index is 28.31 kg/m.  Estimated Nutritional Needs:   Kcal:  1700-1900  Protein:  85-100 grams  Fluid:  1.7 - 1.9 L/day  EDUCATION NEEDS:   No education needs identified at this time  Corrin Parker, MS, RD, LDN Pager # [phone] After hours/  weekend pager # [phone]

## 2017-06-28 NOTE — Progress Notes (Signed)
At 2100 complained of Nausea, no vomiting. Turned TF off from 2100-2300. Scheduled meds given later per daughter request. Refused carafate, IV protonix, and zofran. PRN ultram and trazodone given at [place]. Slept rest of night without complaint of. Refused PRAFO's. Patrici Ranks A

## 2017-06-28 NOTE — Progress Notes (Addendum)
Speech Language Pathology Daily Session Note  Patient Details  Name: NICIE MILAN MRN: [id number] Date of Birth: Nov 02, 1950  Today's Date: 06/28/2017   Skilled treatment session #1 SLP Individual Time: 4098-1191 SLP Individual Time Calculation (min): 45 min   Skilled treatment session #2 SLP Individual Time: 4782-9562 SLP Individual Time Calculation (min): 45 min  Short Term Goals: Week 2: SLP Short Term Goal 1 (Week 2): Pt will consume trials of honey thick via spoon and puree with minimal overt s/s of aspiration to demonstrate readiness for repeat instrumental swallow study. SLP Short Term Goal 2 (Week 2): Pt will increase vocal intensity with Mod A cues to achieve ~ 90% intelligiblity at the phrase level.  SLP Short Term Goal 3 (Week 2): Pt will increase sustained attention to task for ~ 20 minutes with Mod A cues.  SLP Short Term Goal 4 (Week 2): Pt will demonstrate basic problem solving for familiar tasks with Mod A cues.   Skilled Therapeutic Interventions:  Skilled treatment session #1 focused on dysphagia and cognition goals. SLP facilitated session by providing skilled observation of pt consuming 10 boluses of pudding. Pt with mild increase in oral phase but appeared to have good hyolaryngeal excursion and complete oral clearing. Pt's voice remained clear. Pt refused honey thick liquids d/t fear it might make her "sick." SLP placed pt's PMSV uponn arrived to room, and pt able to tolerate without decline in vitals for ~ 45 minutes. Pt required Mod A cues to achieve ~ 75% intelligibility at the phrase level. Pt was left upright in bed, bed alarm on, PMSV off and all needs within reach. Continue per current plan of care.   Skilled treatment session #2 focused on cognition goals. SLP place PMSV on pt when arriving to room and she tolerated without decline in vitals for ~ 45 minutes. Pt required Min A to Mod A cues for basic problem solving task for identifying what was wrong in  pictures. Pt able to recall favorite recipe with only supervision questions to increase interest in therapy session. Despite Max A cues, pt with no insight into deficits or purpose of therapy activities. Extensive education continued to be provided. Pt left upright in bed, bed alarm on, PMSV off and nursing arriving to pt's room. Continue per current plan of care.      Function:  Eating Eating   Modified Consistency Diet: Yes Eating Assist Level: Helper brings food to mouth       Helper Brings Food to Mouth: Occasionally   Cognition Comprehension Comprehension assist level: Understands basic 50 - 74% of the time/ requires cueing 25 - 49% of the time  Expression Expression assistive device: Talk trach valve Expression assist level: Expresses basic 50 - 74% of the time/requires cueing 25 - 49% of the time. Needs to repeat parts of sentences.  Social Interaction Social Interaction assist level: Interacts appropriately 25 - 49% of time - Needs frequent redirection.  Problem Solving Problem solving assist level: Solves basic 25 - 49% of the time - needs direction more than half the time to initiate, plan or complete simple activities  Memory Memory assist level: Recognizes or recalls 25 - 49% of the time/requires cueing 50 - 75% of the time    Pain    Therapy/Group: Individual Therapy  Kalief Kattner 06/28/2017, 10:19 AM

## 2017-06-28 NOTE — Procedures (Signed)
Tracheostomy Change Note  Patient Details:   Name: [name] DOB: 1951-10-15 MRN: [id number]    Airway Documentation:     Evaluation  O2 sats: stable throughout Complications: No apparent complications Patient did not tolerate procedure well. Bilateral Breath Sounds: Rhonchi, downsized trach to #4 CFS per MD order.  Pt tolerated well.  SPO2 100%, good color change on ETCO2,    Gonzella Lex 06/28/2017, 1:54 PM

## 2017-06-29 ENCOUNTER — Inpatient Hospital Stay (HOSPITAL_COMMUNITY): Payer: Medicare Other | Admitting: Physical Therapy

## 2017-06-29 ENCOUNTER — Encounter (HOSPITAL_COMMUNITY): Payer: Medicare Other | Admitting: Psychology

## 2017-06-29 ENCOUNTER — Inpatient Hospital Stay (HOSPITAL_COMMUNITY): Payer: Medicare Other | Admitting: Occupational Therapy

## 2017-06-29 ENCOUNTER — Inpatient Hospital Stay (HOSPITAL_COMMUNITY): Payer: Medicare Other | Admitting: Speech Pathology

## 2017-06-29 ENCOUNTER — Inpatient Hospital Stay (HOSPITAL_COMMUNITY): Payer: Medicare Other

## 2017-06-29 DIAGNOSIS — F4323 Adjustment disorder with mixed anxiety and depressed mood: Secondary | ICD-10-CM

## 2017-06-29 DIAGNOSIS — Z930 Tracheostomy status: Secondary | ICD-10-CM

## 2017-06-29 LAB — GLUCOSE, CAPILLARY
GLUCOSE-CAPILLARY: 158 mg/dL — AB (ref 65–99)
GLUCOSE-CAPILLARY: 183 mg/dL — AB (ref 65–99)
GLUCOSE-CAPILLARY: 186 mg/dL — AB (ref 65–99)
Glucose-Capillary: 153 mg/dL — ABNORMAL HIGH (ref 65–99)
Glucose-Capillary: 59 mg/dL — ABNORMAL LOW (ref 65–99)
Glucose-Capillary: 70 mg/dL (ref 65–99)
Glucose-Capillary: 164 mg/dL — ABNORMAL HIGH (ref 65–99)

## 2017-06-29 MED ORDER — ENOXAPARIN SODIUM 40 MG/0.4ML ~~LOC~~ SOLN
40.0000 mg | SUBCUTANEOUS | Status: DC
  Administered 2017-06-29 – 2017-07-07 (×9): 40 mg via SUBCUTANEOUS
  Filled 2017-06-29 (×9): qty 0.4

## 2017-06-29 MED ORDER — ASPIRIN 325 MG PO TABS
325.0000 mg | ORAL_TABLET | Freq: Every day | ORAL | Status: DC
  Administered 2017-06-30 – 2017-07-06 (×7): 325 mg
  Filled 2017-06-29 (×7): qty 1

## 2017-06-29 MED ORDER — INSULIN ASPART 100 UNIT/ML ~~LOC~~ SOLN
3.0000 [IU] | SUBCUTANEOUS | Status: DC
  Administered 2017-06-29 – 2017-06-30 (×6): 3 [IU] via SUBCUTANEOUS
  Administered 2017-06-30: 04:00:00 via SUBCUTANEOUS
  Administered 2017-07-01 – 2017-07-02 (×9): 3 [IU] via SUBCUTANEOUS

## 2017-06-29 MED ORDER — ACETAZOLAMIDE 250 MG PO TABS
250.0000 mg | ORAL_TABLET | Freq: Two times a day (BID) | ORAL | Status: DC
  Administered 2017-06-29 – 2017-07-06 (×15): 250 mg
  Filled 2017-06-29 (×17): qty 1

## 2017-06-29 MED ORDER — TOPIRAMATE 25 MG PO TABS
50.0000 mg | ORAL_TABLET | Freq: Two times a day (BID) | ORAL | Status: DC
  Administered 2017-06-29 – 2017-07-07 (×17): 50 mg via ORAL
  Filled 2017-06-29 (×19): qty 2

## 2017-06-29 MED ORDER — ALPRAZOLAM 0.25 MG PO TABS
0.2500 mg | ORAL_TABLET | Freq: Three times a day (TID) | ORAL | Status: DC | PRN
  Administered 2017-06-29 – 2017-07-05 (×7): 0.25 mg
  Filled 2017-06-29 (×7): qty 1

## 2017-06-29 MED ORDER — FREE WATER
100.0000 mL | Freq: Every day | Status: DC
  Administered 2017-06-29 – 2017-07-02 (×18): 100 mL

## 2017-06-29 MED ORDER — CLOPIDOGREL BISULFATE 75 MG PO TABS
75.0000 mg | ORAL_TABLET | Freq: Every day | ORAL | Status: DC
  Administered 2017-06-30 – 2017-07-06 (×7): 75 mg
  Filled 2017-06-29 (×7): qty 1

## 2017-06-29 NOTE — Consult Note (Signed)
Neuropsychological Consultation   Patient:   [name]   DOB:   04/04/1951  MR Number:  [id number]  Location:  [facility] [address] [id number] [place] [address] Dept: [place]: [phone]           Date of Service:   06/29/2017  Start Time:   1:30 PM End Time:   2:30 PM  Provider/Observer:  Ilean Skill, Psy.D.       Clinical Neuropsychologist       Billing Code/Service: [phone] 4 Units  Chief Complaint:    [name] is a 66 year old femal with history of DM, HTN, peripheral neuropathy, pseudotumor cerebri, siezure disorder.  Agfter one week history of right hand weakness and HA had MRI completed 05/30/2017.  Acute infarcts in left parietal cortex, white matter and occipital lobe.  After extensive medical interventions by 8/30 continued dysphagia due to sensorimotor deficits and silent aspiration.  Improvements ongoing and recommended due to decline in mobility and ability to carry out ADL tasks.    Reason for Service:  [name] was referred for neuropsychological consultation due to adjustment issues and development of depression and anxiety symptoms.  Below is the full HPI for the current admission.    HPI: [name] is a 66 y.o. 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 She was sent to the ED for full work up. History taken from chart review. Carotid dopplers done revealing L-40-59% ICA stenosis. Cardiac echo revealed EF 60-65% with no wall abnormality and grade 1 diastolic dysfunction. CTA head/neck done revealing large non-calcified plaque in proximal L-ICA causing 60-65% stenosis and likely source of emboli.  She underwent cerebral angiogram for work  up on 8/16 revealing < 50% L-ICA stenosis and during the procedure became unresponsive with bilateral UE tonic posturing and left sided weakness. She was taken to IR for endovascualr revascularization of B-MCA occlusions with TICI reperfusion bilaterally. Dr. Erlinda Hong recommended DAPT x 3 months followed by Plavix alone. EEG revealed mild generalized dysfunction and intermittent BUE twitching not associated with epileptiform activity. Follow up MRI brain reviewed, showing multiple infarcts. Per report, numerous acute new supra and infratentorial infarcts involving B-MCA, and bilateral PCA- no hemorrhage. She was briefly extubated on 8/20 but developed stridor with increase WOB requiring reintubation and beside tracheostomy on 8/23 by Dr. Nelda Marseille.  She was extubated to ATC and downsized to CFS #6 on 8/29. She is tolerating PMSV for 20 minutes with imporved phonation and breath support. She has had issues with copious secretions and mucomyst added. Tracheal secretion positive for MSSA and she was started on cefazolin 8/22 for treatment of MSSA bronchitis v/s PNA. She did have drop in H/H to 7.6 and transfused with one unit PRBC on 8/29. MBS done 8/30 showing dysphagia due to sensorimotor deficits and silent aspiration during swallow and remains NPO but to start therapeutic trials with ST. She is showing improvement in participation with encouragement and is tolerating activity at EOB. CIR recommended due to decline in mobility and ability to carry out ADL tasks.   Current Status:  The patient is acutely stressed due to loss of function and the fact that her 36 year old son is getting married in 9 days.  She  is worried that she will not be able to attend and worried that her disabilities will not recover.    Behavioral Observation: [name]  presents as a 66 y.o.-year-old Right Caucasian Female who appeared her stated age. her dress was Appropriate and she was Well Groomed and her manners were Appropriate to the  situation.  her participation was indicative of Appropriate behaviors.  There were physical disabilities noted.  she displayed an appropriate level of cooperation and motivation.     Interactions:    Active Appropriate and Drowsy  Attention:   abnormal and attention span appeared shorter than expected for age  Memory:   within normal limits; recent and remote memory intact  Visuo-spatial:  within normal limits  Speech (Volume):  low  Speech:   normal;   Thought Process:  Coherent and Relevant  Though Content:  WNL;   Orientation:   person, place, time/date and situation  Judgment:   Fair  Planning:   Fair  Affect:    Anxious and Depressed  Mood:    Anxious and Depressed  Insight:   Fair  Intelligence:   normal  Medical History:   Past Medical History:  Diagnosis Date  . Anemia    takes iron supplement  . Anesthesia complication    disorientation due to pseudotumor  . Diabetes mellitus    IDDM  . Family history of anesthesia complication 68 yrs ago   brother stopped breathing for a minute or two  . GERD (gastroesophageal reflux disease)   . Headache(784.0)    due to pseudotumor; daily headache  . Hypercholesteremia    unable to tolerate statins  . Hypertension    under control with meds., has been on med. x 5 yr.  . Peripheral neuropathy   . PONV (postoperative nausea and vomiting)   . 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 Synovitis of ankle 04/2013   left  . Wears dentures    full        Family Med/Psych History:  Family History  Problem Relation Age of Onset  . Heart disease Father   . Migraines Neg Hx     Risk of Suicide/Violence: low Patient denies SI or HI  Impression/DX:  [name] is a 66 year old femal with history of DM, HTN, peripheral neuropathy, pseudotumor cerebri, siezure disorder.  Agfter one week history of right hand weakness and HA had MRI  completed 05/30/2017.  Acute infarcts in left parietal cortex, white matter and occipital lobe.  After extensive medical interventions by 8/30 continued dysphagia due to sensorimotor deficits and silent aspiration.  Improvements ongoing and recommended due to decline in mobility and ability to carry out ADL tasks.   The patient did understand the limitations that are likely to keep her from her son's wedding in 9 days.  The patient is aware of limitations, but is having difficulty adjusting to changes post CVA.         Electronically Signed   _______________________ Ilean Skill, Psy.D.

## 2017-06-29 NOTE — Progress Notes (Signed)
Pt refusing Protonix and prot stat. RN educate pt and daughter on importance of medications. Pt continues to refuse. She states, "It makes me sick to my stomach."  RN notified Algis Liming, PA.  Pt educated on importance of pressure relief and turning while in bed and wheelchair. Pt refuses to turn and relief pressure to sacrum. Air mattress overlay ordered and RN continues to educate daughter and pt on prevention of skin breakdown.

## 2017-06-29 NOTE — Progress Notes (Signed)
RN to cork trach tomorrow during day shift. Pt having increased secretions and states she would rather try during the day. Daughter agrees. Pt had two episodes of emesis throughout shift. Algis Liming, PA aware

## 2017-06-29 NOTE — Progress Notes (Signed)
Physical Therapy Session Note  Patient Details  Name: [name] MRN: [id number] Date of Birth: July 03, 1951  Today's Date: 06/29/2017 PT Missed Time: 51 Minutes Missed Time Reason: Patient ill (Comment);Patient unwilling to participate (Pt reports feeling nauseated, pt with low blood sugar of 59)    Skilled Therapeutic Interventions/Progress Updates:    PT supine in bed upon PT arrival, reports feeling to sick to participate this session and low blood glucose level.    See Function Navigator for Current Functional Status.   Therapy/Group: Individual Therapy  Netta Corrigan, PT, DPT 06/29/2017, 4:09 PM

## 2017-06-29 NOTE — Progress Notes (Signed)
[place] PHYSICAL MEDICINE & REHABILITATION     PROGRESS NOTE  Subjective/Complaints:   Downsized to #4 shiley, we discussed capping trach at night. Tolerating Passy-Muir valve. Not using any oxygen ROS: limited  Objective:  Vital Signs: Blood pressure (!) 132/59, pulse 79, temperature 97.9 F (36.6 C), temperature source Oral, resp. rate 16, height 5\' 4"  (1.626 m), weight 77.4 kg (170 lb 10.2 oz), SpO2 93 %. No results found. No results for input(s): WBC, HGB, HCT, PLT in the last 72 hours. No results for input(s): NA, K, CL, GLUCOSE, BUN, CREATININE, CALCIUM in the last 72 hours.  Invalid input(s): CO CBG (last 3)   Recent Labs  06/28/17 2349 06/29/17 0348 06/29/17 0809  GLUCAP 203* 186* 153*    Wt Readings from Last 3 Encounters:  06/29/17 77.4 kg (170 lb 10.2 oz)  06/18/17 76.4 kg (168 lb 6.9 oz)  01/15/16 79.3 kg (174 lb 12.8 oz)    Physical Exam:  BP (!) 132/59 (BP Location: Left Arm)   Pulse 79   Temp 97.9 F (36.6 C) (Oral)   Resp 16   Ht 5\' 4"  (1.626 m)   Wt 77.4 kg (170 lb 10.2 oz)   SpO2 93% Comment: 97% post sx  BMI 29.29 kg/m  Constitutional: She appears well-developed and well-nourished.  HENT: Normocephalic and atraumatic.  Eyes: EOM are normal. No discharge Neck: CFS #6 without drainage today Cardiovascular: RRR. No JVD Respiratory: normal effort. Fair flow, rhonchi heard GI: distended , reduced BS, NT  +tympany Musculoskeletal: She exhibits no edema or tenderness.  Neurological: Alert  Left facial droop with dysarthric speech.  Right sided weakness with decrease in motor control.  Right homonymous hemianopsia RUE grossly 3- to 3/5.Marland Kitchen  RLE grossly 4-/5.  Skin: Skin is warm and dry. She is not diaphoretic.  Psychiatric: affect flat, processing delayed Assessment/Plan: 1. 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 Physiatrist is providing close team supervision and 24 hour management of active medical problems listed below. Physiatrist and rehab team continue to assess barriers to discharge/monitor patient progress toward functional and medical goals.  Function:  Bathing Bathing position   Position: Wheelchair/chair at sink  Bathing parts Body parts bathed by patient: Chest, Abdomen Body parts bathed by helper: Right arm, Left arm, Front perineal area, Buttocks, Back  Bathing assist Assist Level:  (MaxA )      Upper Body Dressing/Undressing Upper body dressing   What is the patient wearing?: Pull over shirt/dress     Pull over shirt/dress - Perfomed by patient: Thread/unthread left sleeve, Put head through opening, Pull shirt over trunk Pull over shirt/dress - Perfomed by helper: Thread/unthread right sleeve        Upper body assist Assist Level: Touching or steadying assistance(Pt > 75%)   Set up : To obtain clothing/put away  Lower Body Dressing/Undressing Lower body dressing   What is the patient wearing?: Pants, Non-skid slipper socks       Pants- Performed by helper: Thread/unthread right pants leg, Thread/unthread left pants leg, Pull pants up/down   Non-skid slipper socks- Performed by helper: Don/doff right sock, Don/doff left sock                  Lower body assist Assist for lower body dressing: 2 Helpers      Toileting Toileting Toileting activity did not occur: Refused  Toileting steps completed by helper: Adjust clothing prior to toileting, Performs perineal hygiene, Adjust clothing after toileting    Toileting assist     Transfers Chair/bed transfer   Chair/bed transfer method: Other Chair/bed transfer assist level: 2 helpers Chair/bed transfer assistive device: Mechanical lift Mechanical lift: Ecologist Ambulation activity did not occur: Safety/medical concerns         Wheelchair           Cognition Comprehension Comprehension assist level: Understands basic 50 - 74% of the time/ requires cueing 25 - 49% of the time  Expression Expression assist level: Expresses basic 50 - 74% of the time/requires cueing 25 - 49% of the time. Needs to repeat parts of sentences.  Social Interaction Social Interaction assist level: Interacts appropriately 25 - 49% of time - Needs frequent redirection.  Problem Solving Problem solving assist level: Solves basic 25 - 49% of the time - needs direction more than half the time to initiate, plan or complete simple activities  Memory Memory assist level: Recognizes or recalls 25 - 49% of the time/requires cueing 50 - 75% of the time    Medical Problem List and Plan:  1. 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 DVT Prophylaxis/Anticoagulation: Pharmaceutical: Lovenox  3. Headaches/Pain Management:   Topamax started 9/1--pt with persistent headaches   -increase to 50mg  twice a day, also on Diamox, per Dr Jaynee Eagles will  reduce Diamox 4. Mood: LCSW to follow for evaluation and support.  5. Neuropsych: This patient is not capable of making decisions on her own behalf.  6. Skin/Wound Care: Routine pressure relief measures.  7. Fluids/Electrolytes/Nutrition: Monitor I/Os Per dietary, via PEG tube 8. MSSA PNA: Completed treatment 7 day course of cefazolin on 8/30. Continue to monitor with strict aspiration precautions--HOB > 30 degrees at all times.  9. VDRF: Tolerating PMSV with supervision. Secretions felt to be in part due to GERD.   -continue #6 trach until secretion mgt/swallow better 10 HTN: Monitor BP bid--on lopressor bid and catapres Vitals:   06/29/17 0359 06/29/17 0420  BP: (!) 132/59   Pulse: 77 79  Resp: 16 16  Temp: 97.9 F (36.6 C)   SpO2: 100% 93%    bp  controlled . 06/29/2017 11. ABLA: Improved with transfusion.   Hemoglobin 9.4 on 9/1  Continue to monitor 12. Anxiety disorder:Team to provide ego support. Daughter staying to encourage patient. Palliative following for support and placed on IV ativan tid for symptoms management--decrease to prn due to sedation.  13. T2DM: Hgb A1C-  - CBG (last 3)   Recent Labs  06/28/17 2349 06/29/17 0348 06/29/17 0809  GLUCAP 203* 186* 153*  Fair control   -increase lantus to 30 u daily, on TF after PEG  -continue scheduled novolog 14. 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 Leucocytosis: persistent .Monitor for signs of infection.   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 B-MCA stenosis/L-ICA stenosis s/p revascularization: On ASA/Plavix.  54. Acute urinary retention:   Foley out  Ua+, ucx with multispecies-   LOS (Days) 11 A FACE TO FACE EVALUATION WAS PERFORMED  KIRSTEINS,ANDREW E 06/29/2017 8:52 AM

## 2017-06-29 NOTE — Progress Notes (Signed)
Occupational Therapy Session Note  Patient Details  Name: [name] MRN: [id number] Date of Birth: 02/24/1951  Today's Date: 06/29/2017 OT Individual Time: 4081-4481 OT Individual Time Calculation (min): 70 min    Short Term Goals: Week 2:  OT Short Term Goal 1 (Week 2): Pt will complete toilet transfer with max A in order to decrease burden of care OT Short Term Goal 2 (Week 2): Pt will recall and utilize hemi dressing technique with 1 VC OT Short Term Goal 3 (Week 2): Pt will complete sit <> stand at sink with max A in prep for LB   Skilled Therapeutic Interventions/Progress Updates:    Pt seren for OT session focusing on functional transfers, activity tolerance, and ADL re-training. Pt in supine upon arrival, daught eer present for session.  Pt required increased time and encouragement for participatin, hwoever, agreeable to transfer to [facility] for toileting task. She transferred to EOB with mod A and VCs for technique/ sequencing. Required +2 assist to stand in STEDY from EOB and transfer to [facility]. Total A for clothing management and hygiene. While seated on BSC, pt reported feeling "funny" however was unable to elaborate. BP in sitting 137/62,. Following extended seated break on [facility] she transferred to w/c in same manner as described above. Bathing/dressing completed from w/c level at sink. PT requiring significantly increased time, rest, breaks, and encouragement to complete all tasks. She required hand over hand assist to use R UE in functional manner during bathing and dressing tasks. Mod A for clothing orientation and problem solving. Unsure of self limiting behaviors vs. True cognitive deficits with dressing tasks.  Walked pt through deep breathing techniques in order to try to reduce anxiety. She stood at sink with +2 assist for LB dressing to be completed.  In hallway, practiced w/c mobility with verbal and demonstrational cuing for technique. Pt able to propel ~26ft with min A before  fatiguing and refusing to go further. Pt left seated in w/c at end of session with all needs in reach and daughter and RN present. Pt tolerated PMSV well throughout session on RA>   Therapy Documentation Precautions:  Precautions Precautions: Fall Precaution Comments: cortrak, trach Restrictions Weight Bearing Restrictions: No  See Function Navigator for Current Functional Status.   Therapy/Group: Individual Therapy  Lewis, Jelisha Weed C 06/29/2017, 7:00 AM

## 2017-06-29 NOTE — Progress Notes (Signed)
Speech Language Pathology Daily Session Note  Patient Details  Name: [name] MRN: [id number] Date of Birth: 1951/04/30  Today's Date: 06/29/2017 SLP Individual Time: 1305-1330 SLP Individual Time Calculation (min): 25 min  Short Term Goals: Week 2: SLP Short Term Goal 1 (Week 2): Pt will consume trials of honey thick via spoon and puree with minimal overt s/s of aspiration to demonstrate readiness for repeat instrumental swallow study. SLP Short Term Goal 2 (Week 2): Pt will increase vocal intensity with Mod A cues to achieve ~ 90% intelligiblity at the phrase level.  SLP Short Term Goal 3 (Week 2): Pt will increase sustained attention to task for ~ 20 minutes with Mod A cues.  SLP Short Term Goal 4 (Week 2): Pt will demonstrate basic problem solving for familiar tasks with Mod A cues.   Skilled Therapeutic Interventions:  Pt was seen for skilled ST targeting cognitive and dysphagia goals.  Pt slightly more awake this afternoon in comparison to AM treatment session but continued to need max encouragement for participation in therapies.  SLP attempted therapeutic trials of ice chips to continue working towards repeat objective assessment; however, pt orally expectorated trials stating that they were too cold.  Pt would not even orally accept teaspoons of thin liquids.  She was agreeable and able to consume ~5 teaspoons of applesauce and demonstrated no overt s/s of aspiration.  Pt sustained her attention to functional snack for ~5 minute intervals with mod verbal cues for redirection due to internal distraction and anxiety.  Pt was left in bed with daughter at bedside.  Continue per current plan of care.   Function:  Eating Eating   Modified Consistency Diet: Yes (trials of puree with SLP) Eating Assist Level: Helper feeds patient           Cognition Comprehension Comprehension assist level: Understands basic 75 - 89% of the time/ requires cueing 10 - 24% of the time  Expression  Expression assistive device: Talk trach valve Expression assist level: Expresses basic 75 - 89% of the time/requires cueing 10 - 24% of the time. Needs helper to occlude trach/needs to repeat words.  Social Interaction Social Interaction assist level: Interacts appropriately 25 - 49% of time - Needs frequent redirection.  Problem Solving Problem solving assist level: Solves basic 25 - 49% of the time - needs direction more than half the time to initiate, plan or complete simple activities  Memory Memory assist level: Recognizes or recalls 25 - 49% of the time/requires cueing 50 - 75% of the time    Pain Pain Assessment Pain Assessment: No/denies pain  Therapy/Group: Individual Therapy  Agustina Witzke, Selinda Orion 06/29/2017, 1:41 PM

## 2017-06-29 NOTE — Progress Notes (Signed)
RN educated daughter, [name], on tube feedings, medication administration and site care. RN demonstrated proper technique with daughter providing teach back.  Rn to continue to educate.

## 2017-06-29 NOTE — Significant Event (Signed)
Hypoglycemic Event  CBG: 59  Treatment: 15 GM carbohydrate snack  Symptoms: None  Follow-up CBG: Time:1643 CBG Result:70  Possible Reasons for Event: Vomiting and Unknown  Comments/MD notified: Algis Liming, PA aware- tube feeding started back    Cherlynn Polo

## 2017-06-29 NOTE — Progress Notes (Signed)
Speech Language Pathology Daily Session Note  Patient Details  Name: [name] MRN: [id number] Date of Birth: [birth year]  Today's Date: 06/29/2017 SLP Individual Time: 1005-1020 SLP Individual Time Calculation (min): 15 min  Short Term Goals: Week 2: SLP Short Term Goal 1 (Week 2): Pt will consume trials of honey thick via spoon and puree with minimal overt s/s of aspiration to demonstrate readiness for repeat instrumental swallow study. SLP Short Term Goal 2 (Week 2): Pt will increase vocal intensity with Mod A cues to achieve ~ 90% intelligiblity at the phrase level.  SLP Short Term Goal 3 (Week 2): Pt will increase sustained attention to task for ~ 20 minutes with Mod A cues.  SLP Short Term Goal 4 (Week 2): Pt will demonstrate basic problem solving for familiar tasks with Mod A cues.   Skilled Therapeutic Interventions:  Pt was seen for skilled ST targeting communication goals.  Pt was laying in bed upon arrival, lethargic and with complaints of nausea.  Per pt's daughter, pt had vomited ~40 minutes prior to therapist's arrival.  Pt's trach was downsized yesterday and pt was able to vocalize around trach but was only ~50% intelligible at the phrase level.  With speaking valve donned pt was 100% intelligible at the phrase level with min assist verbal cues for increased vocal intensity.  Pt demonstrated good toleration of valve for brief periods with no changes in breathing or vital signs; however, valve was removed upon therapist's departure due to pt falling asleep with valve on.  Session was ended early due to nausea and fatigue.  Pt was left in bed with bed alarm set and daughter at bedside.  Continue per current plan of care.    Function:  Eating Eating                 Cognition Comprehension Comprehension assist level: Understands basic 75 - 89% of the time/ requires cueing 10 - 24% of the time  Expression Expression assistive device: Talk trach valve Expression assist  level: Expresses basic 75 - 89% of the time/requires cueing 10 - 24% of the time. Needs helper to occlude trach/needs to repeat words.  Social Interaction Social Interaction assist level: Interacts appropriately 50 - 74% of the time - May be physically or verbally inappropriate.  Problem Solving Problem solving assist level: Solves basic 25 - 49% of the time - needs direction more than half the time to initiate, plan or complete simple activities  Memory Memory assist level: Recognizes or recalls 25 - 49% of the time/requires cueing 50 - 75% of the time    Pain Pain Assessment Pain Assessment: No/denies pain  Therapy/Group: Individual Therapy  Marna Weniger, Selinda Orion 06/29/2017, 10:34 AM

## 2017-06-30 ENCOUNTER — Inpatient Hospital Stay (HOSPITAL_COMMUNITY): Payer: Medicare Other | Admitting: Occupational Therapy

## 2017-06-30 ENCOUNTER — Inpatient Hospital Stay (HOSPITAL_COMMUNITY): Payer: Medicare Other | Admitting: Physical Therapy

## 2017-06-30 ENCOUNTER — Inpatient Hospital Stay (HOSPITAL_COMMUNITY): Payer: Medicare Other | Admitting: Speech Pathology

## 2017-06-30 ENCOUNTER — Inpatient Hospital Stay (HOSPITAL_COMMUNITY): Payer: Medicare Other

## 2017-06-30 DIAGNOSIS — Z794 Long term (current) use of insulin: Secondary | ICD-10-CM

## 2017-06-30 DIAGNOSIS — E0800 Diabetes mellitus due to underlying condition with hyperosmolarity without nonketotic hyperglycemic-hyperosmolar coma (NKHHC): Secondary | ICD-10-CM

## 2017-06-30 LAB — CBC
HEMATOCRIT: 30.5 % — AB (ref 36.0–46.0)
HEMOGLOBIN: 9.6 g/dL — AB (ref 12.0–15.0)
MCH: 27.7 pg (ref 26.0–34.0)
MCHC: 31.5 g/dL (ref 30.0–36.0)
MCV: 87.9 fL (ref 78.0–100.0)
Platelets: 414 10*3/uL — ABNORMAL HIGH (ref 150–400)
RBC: 3.47 MIL/uL — ABNORMAL LOW (ref 3.87–5.11)
RDW: 14.6 % (ref 11.5–15.5)
WBC: 10.2 10*3/uL (ref 4.0–10.5)

## 2017-06-30 LAB — GLUCOSE, CAPILLARY
GLUCOSE-CAPILLARY: 222 mg/dL — AB (ref 65–99)
GLUCOSE-CAPILLARY: 241 mg/dL — AB (ref 65–99)
GLUCOSE-CAPILLARY: 117 mg/dL — AB (ref 65–99)
GLUCOSE-CAPILLARY: 120 mg/dL — AB (ref 65–99)
Glucose-Capillary: 116 mg/dL — ABNORMAL HIGH (ref 65–99)

## 2017-06-30 MED ORDER — INSULIN GLARGINE 100 UNIT/ML ~~LOC~~ SOLN
35.0000 [IU] | Freq: Every day | SUBCUTANEOUS | Status: DC
  Administered 2017-07-01: 35 [IU] via SUBCUTANEOUS
  Filled 2017-06-30: qty 0.35

## 2017-06-30 NOTE — Progress Notes (Signed)
Occupational Therapy Session Note  Patient Details  Name: [name] MRN: [id number] Date of Birth: 11/26/1950  Today's Date: 06/30/2017 OT Individual Time: 3086 (make up)-1600 OT Individual Time Calculation (min): 40 min    Short Term Goals: Week 2:  OT Short Term Goal 1 (Week 2): Pt will complete toilet transfer with max A in order to decrease burden of care OT Short Term Goal 2 (Week 2): Pt will recall and utilize hemi dressing technique with 1 VC OT Short Term Goal 3 (Week 2): Pt will complete sit <> stand at sink with max A in prep for LB   Skilled Therapeutic Interventions/Progress Updates:    Upon entering the room, pt supine in bed with daughter present in room. Pt agreeable to OT intervention. Pt performed supine >sit with min A. Pt performed sit >stand with lifting assistance and min cues for hand placement. Pt ambulating 10' with RW and min A into bathroom. Pt performed transfer onto standard toilet with assistance for lifting and lowering. Once seated pt tearful and stating, " Why can't I go." Pt required increased time before voiding. Pt able to perform hygiene but needing assistance with LB clothing management. Pt returning to bed with increased fatigue and needing mod A for ambulation. Pt returned to bed with min A for sit >supine. Call bell and all needed items within reach upon exiting the room.   Therapy Documentation Precautions:.  Precautions Precautions: Fall Precaution Comments: cortrak, trach Restrictions Weight Bearing Restrictions: No General:   Vital Signs: Therapy Vitals Temp: (!) 97.5 F (36.4 C) Temp Source: Oral Pulse Rate: 72 Resp: 14 BP: (!) 122/57 Patient Position (if appropriate): Lying Oxygen Therapy SpO2: 98 % O2 Device: Not Delivered FiO2 (%): 21 % Pain: Pain Assessment Faces Pain Scale: Hurts little more Pain Type: Acute pain Pain Location: Abdomen Pain Descriptors / Indicators: Aching Pain Intervention(s): Medication (See  eMAR) ADL:   Vision   Perception    Praxis   Exercises:   Other Treatments:    See Function Navigator for Current Functional Status.   Therapy/Group: Individual Therapy  Gypsy Decant 06/30/2017, 4:21 PM

## 2017-06-30 NOTE — Progress Notes (Signed)
[facility] PHYSICAL MEDICINE & REHABILITATION     PROGRESS NOTE  Subjective/Complaints:   Downsized to #4 shiley,did not cap trach due to vomiting Has hx of gastroparesis ROS: limited  Objective:  Vital Signs: Blood pressure 139/60, pulse 84, temperature 97.9 F (36.6 C), temperature source Oral, resp. rate 16, height 5\' 4"  (1.626 m), weight 77.7 kg (171 lb 4.8 oz), SpO2 99 %. No results found.  Recent Labs  06/30/17 0444  WBC 10.2  HGB 9.6*  HCT 30.5*  PLT 414*   No results for input(s): NA, K, CL, GLUCOSE, BUN, CREATININE, CALCIUM in the last 72 hours.  Invalid input(s): CO CBG (last 3)   Recent Labs  06/29/17 2358 06/30/17 0426 06/30/17 0841  GLUCAP 183* 222* 241*    Wt Readings from Last 3 Encounters:  06/30/17 77.7 kg (171 lb 4.8 oz)  06/18/17 76.4 kg (168 lb 6.9 oz)  01/15/16 79.3 kg (174 lb 12.8 oz)    Physical Exam:  BP 139/60   Pulse 84   Temp 97.9 F (36.6 C) (Oral)   Resp 16   Ht 5\' 4"  (1.626 m)   Wt 77.7 kg (171 lb 4.8 oz)   SpO2 99%   BMI 29.40 kg/m  Constitutional: She appears well-developed and well-nourished.  HENT: Normocephalic and atraumatic.  Eyes: EOM are normal. No discharge Neck: CFS #6 without drainage today Cardiovascular: RRR. No JVD Respiratory: normal effort. Fair flow, rhonchi heard GI: distended , reduced BS, NT  +tympany Musculoskeletal: She exhibits no edema or tenderness.  Neurological: Alert  Left facial droop with dysarthric speech.  Right sided weakness with decrease in motor control.  Right homonymous hemianopsia RUE grossly 3- to 3/5.Marland Kitchen  RLE grossly 4-/5.  Skin: Skin is warm and dry. She is not diaphoretic.  Psychiatric: affect flat, processing delayed Assessment/Plan: 1. 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 Physiatrist is providing close team  supervision and 24 hour management of active medical problems listed below. Physiatrist and rehab team continue to assess barriers to discharge/monitor patient progress toward functional and medical goals.  Function:  Bathing Bathing position   Position: Wheelchair/chair at sink  Bathing parts Body parts bathed by patient: Chest, Abdomen, Right arm, Left arm, Front perineal area, Right upper leg, Left upper leg Body parts bathed by helper: Buttocks, Back, Right lower leg, Left lower leg  Bathing assist Assist Level:  (MaxA )      Upper Body Dressing/Undressing Upper body dressing   What is the patient wearing?: Pull over shirt/dress     Pull over shirt/dress - Perfomed by patient: Thread/unthread left sleeve, Put head through opening, Pull shirt over trunk Pull over shirt/dress - Perfomed by helper: Thread/unthread right sleeve        Upper body assist Assist Level: Touching or steadying assistance(Pt > 75%)   Set up : To obtain clothing/put away  Lower Body Dressing/Undressing Lower body dressing   What is the patient wearing?: Pants, Non-skid slipper socks       Pants- Performed by helper: Thread/unthread right pants leg, Thread/unthread left pants leg, Pull pants up/down   Non-skid slipper socks- Performed by helper: Don/doff right sock, Don/doff left sock                  Lower body assist Assist for lower body dressing: 2 Automotive engineer  activity did not occur: Refused   Toileting steps completed by helper: Adjust clothing prior to toileting, Performs perineal hygiene, Adjust clothing after toileting    Toileting assist Assist level: Two helpers   Transfers Chair/bed transfer   Chair/bed transfer method: Other Chair/bed transfer assist level: 2 helpers Chair/bed transfer assistive device: Mechanical lift Mechanical lift: Ecologist Ambulation activity did not occur: Safety/medical concerns          Wheelchair          Cognition Comprehension Comprehension assist level: Understands basic 75 - 89% of the time/ requires cueing 10 - 24% of the time  Expression Expression assist level: Expresses basic 75 - 89% of the time/requires cueing 10 - 24% of the time. Needs helper to occlude trach/needs to repeat words.  Social Interaction Social Interaction assist level: Interacts appropriately 25 - 49% of time - Needs frequent redirection.  Problem Solving Problem solving assist level: Solves basic 25 - 49% of the time - needs direction more than half the time to initiate, plan or complete simple activities  Memory Memory assist level: Recognizes or recalls 25 - 49% of the time/requires cueing 50 - 75% of the time    Medical Problem List and Plan:  1. 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 DVT Prophylaxis/Anticoagulation: Pharmaceutical: Lovenox PLT 414K on 9/12 3. Headaches/Pain Management:   Topamax started 9/1--pt with persistent headaches   -increase to 50mg  twice a day, also on Diamox, per Dr Jaynee Eagles will  reduce Diamox 4. Mood: LCSW to follow for evaluation and support.  5. Neuropsych: This patient is not capable of making decisions on her own behalf.  6. Skin/Wound Care: Routine pressure relief measures.  7. Fluids/Electrolytes/Nutrition: Monitor I/Os Per dietary, via PEG tube 8. MSSA PNA: Completed treatment 7 day course of cefazolin on 8/30. Continue to monitor with strict aspiration precautions--HOB > 30 degrees at all times.  9. VDRF: Tolerating PMSV with supervision. Secretions felt to be in part due to GERD.   -continue #6 trach until secretion mgt/swallow better 10 HTN: Monitor BP bid--on lopressor bid and catapres Vitals:   06/30/17 0445 06/30/17 0832  BP:  139/60  Pulse: 79 84  Resp: 16    Temp:    SpO2: 99% 99%    bp controlled . 06/30/2017 11. ABLA: Improved with transfusion.   Hemoglobin 9.6 on 9/12 Continue to monitor 12. Anxiety disorder:Team to provide ego support. Daughter staying to encourage patient. Palliative following for support and placed on IV ativan tid for symptoms management--decrease to prn due to sedation.  13. T2DM: Hgb A1C-  - CBG (last 3)   Recent Labs  06/29/17 2358 06/30/17 0426 06/30/17 0841  GLUCAP 183* 222* 241*  Fair control   -increase lantus to 35u daily, on TF after PEG  -continue scheduled novolog 14. 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 Leucocytosis:resolved .Monitor for signs of infection.   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 B-MCA stenosis/L-ICA stenosis s/p revascularization: On ASA/Plavix.  57. Acute urinary retention:   Foley out  Ua+, ucx with multispecies-   LOS (Days) 12 A FACE TO FACE EVALUATION WAS PERFORMED  Charlett Blake 06/30/2017 9:47 AM

## 2017-06-30 NOTE — Progress Notes (Signed)
Pt. Is tolerating trach being capped well at this time. No distress noted & all vitals are within normal limits. Pt. Is resting comfortably at this time.

## 2017-06-30 NOTE — Progress Notes (Signed)
Speech Language Pathology Daily Session Note  Patient Details  Name: [name] MRN: [id number] Date of Birth: [birth year]  Today's Date: 06/30/2017 SLP Individual Time: 1005-1100 SLP Individual Time Calculation (min): 55 min  Short Term Goals: Week 2: SLP Short Term Goal 1 (Week 2): Pt will consume trials of honey thick via spoon and puree with minimal overt s/s of aspiration to demonstrate readiness for repeat instrumental swallow study. SLP Short Term Goal 2 (Week 2): Pt will increase vocal intensity with Mod A cues to achieve ~ 90% intelligiblity at the phrase level.  SLP Short Term Goal 3 (Week 2): Pt will increase sustained attention to task for ~ 20 minutes with Mod A cues.  SLP Short Term Goal 4 (Week 2): Pt will demonstrate basic problem solving for familiar tasks with Mod A cues.   Skilled Therapeutic Interventions:  Pt was seen for skilled ST targeting goals for speech and swallowing.  Pt continues to need max encouragement to participate in therapies meaningfully.  Pt consumed 10 bites of puree without overt s/s of aspiration but declined any liquids or ice chips.  Therapist facilitated the session with a verbal description task to address use of intelligibility strategies.  Pt needed mod-max assist verbal cues to increase vocal intensity to achieve intelligibility at the phrase level.  Pt was left in bed with daughter at bedside.  Continue per current plan of care.    Function:  Eating Eating   Modified Consistency Diet:  (trials with SLP ) Eating Assist Level: Helper feeds patient           Cognition Comprehension Comprehension assist level: Understands basic 75 - 89% of the time/ requires cueing 10 - 24% of the time  Expression Expression assistive device: Talk trach valve Expression assist level: Expresses basic 50 - 74% of the time/requires cueing 25 - 49% of the time. Needs to repeat parts of sentences.  Social Interaction Social Interaction assist level:  Interacts appropriately 25 - 49% of time - Needs frequent redirection.  Problem Solving Problem solving assist level: Solves basic 25 - 49% of the time - needs direction more than half the time to initiate, plan or complete simple activities  Memory Memory assist level: Recognizes or recalls 25 - 49% of the time/requires cueing 50 - 75% of the time    Pain Pain Assessment Pain Assessment: No/denies pain  Therapy/Group: Individual Therapy  Idolina Mantell, Selinda Orion 06/30/2017, 11:59 AM

## 2017-06-30 NOTE — Telephone Encounter (Signed)
From epic notes, pt is still in hospital inpatient rehab. Rn made Dr. Jaynee Eagles aware of pt still in hospital. If patient or family calls back to schedule a follow up with Dr Jaynee Eagles please notified nurse so an appt can be made for a hospital follow up per Dr. Jaynee Eagles.

## 2017-06-30 NOTE — Progress Notes (Signed)
Social Work Patient ID: [name], female   DOB: [birth year], 66 y.o.   MRN: [id number]  Met with pt and daughter to inform team conference goals min assist wheelchair level and target discharge 9/21. Lurline Idol being capped today and hopeful to get out prior to discharge home. Daughter is here to learn her care and provide emotional support. Both hope she will be ready next Friday. Her son is getting married next Sat 9/22 and they have set up a way to watch on computer. Will work on equipment and follow up needs. Daughter is here and will continue to participate in her care.

## 2017-06-30 NOTE — Progress Notes (Signed)
Occupational Therapy Note  Patient Details  Name: [name] MRN: [id number] Date of Birth: 06-Apr-1951  Today's Date: 06/30/2017 OT Individual Time: 1130-1200 OT Individual Time Calculation (min): 30 min   Pt denies pain Individual Therapy  Pt resting in bed upon arrival and agreeable to participating in therapy.  Pt engaged in RUE therapeutic tasks with focus on increased grasp and shoulder flexion/extension.  Pt with diminished function but able to move RUE throughout functional range. Pt remained in bed with all needs with all needs within reach and bed alarm activated.    Leotis Shames [facility] 06/30/2017, 12:13 PM

## 2017-06-30 NOTE — Patient Care Conference (Signed)
Inpatient RehabilitationTeam Conference and Plan of Care Update Date: 06/30/2017   Time: 11:00 AM    Patient Name: [name]      Medical Record Number: [id number]  Date of Birth: 09/23/1951 Sex: Female         Room/Bed: 4W10C/4W10C-01 Payor Info: Payor: Theme park manager MEDICARE / Plan: [employer] MEDICARE / Product Type: *No Product type* /    Admitting Diagnosis: B CVA  Admit Date/Time:  06/18/2017  5:48 PM Admission Comments: No comment available   Primary Diagnosis:  <principal problem not specified> Principal Problem: <principal problem not specified>  Patient Active Problem List   Diagnosis Date Noted  . Adjustment disorder with mixed anxiety and depressed mood   . Hemoptysis   . Hematemesis   . Intractable vomiting with nausea   . Acute lower UTI   . Urinary retention   . Diabetic gastroparesis ([place])   . Vascular headache   . Diabetes mellitus ([place])   . CVA (cerebral vascular accident) ([place]) 06/18/2017  . Weakness   . 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 Right hemiparesis ([place])   . DNR (do not resuscitate) discussion 06/16/2017  . Palliative care by specialist 06/16/2017  . Anxiety state   . Acute embolic stroke ([place])   . Benign essential HTN   . Diabetic peripheral neuropathy ([place])   . Pseudotumor cerebri   . Hypokalemia   . Seizure ([place])   . Leukocytosis   . Acute blood loss anemia   . Post-operative pain   . Encounter for feeding tube placement   . Respiratory failure ([place])   . Status post tracheostomy ([place])   . Acute respiratory failure with hypoxemia ([place])   . Stenosis of left carotid artery   . Acute CVA (cerebrovascular accident) ([place]) 05/31/2017  . Type II diabetes mellitus with complication ([place]) 19/50/9326  . Hypertension 05/31/2017  . Hyponatremia 05/31/2017  . IIH (idiopathic intracranial hypertension) 01/18/2016  . 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  Worsening headaches 01/18/2016  . Increased abdominal girth 01/18/2016  . Abdominal fluid collection 01/18/2016  . Vision changes 01/18/2016  . Perceived hearing changes 01/18/2016  . Nausea without vomiting 01/18/2016  . Peripheral vision loss 01/18/2016  . Imbalance 01/18/2016  . Falls 01/18/2016  . Dysphagia, oropharyngeal 01/18/2016  . Subacute confusional state 01/18/2016  . Left breast mass 05/05/2011    Expected Discharge Date: Expected Discharge Date: 07/09/17  Team Members Present: Physician leading conference: Dr. Alysia Penna Social Worker Present: Ovidio Kin, LCSW Nurse Present: Junius Creamer, RN PT Present: Barrie Folk, PT OT Present: Napoleon Form, OT SLP Present: Charolett Bumpers, SLP PPS Coordinator present : Ileana Ladd, PT     Current Status/Progress Goal Weekly Team Focus  Medical   gastroparesis, vomiting  wean trach  increase therapy participation   Bowel/Bladder   reamin continent of B/B using bedpain , BM this shift  Remain continent with regular BM daily and prn   Assess for constipation within unit standards 2-3 days administer laxative    Swallow/Nutrition/ Hydration   NPO with PEG, limited PO intake, does not like anything offered for therapy   min assist  trials of ice chips, honey thick liquids, and purees to continue working towards repeat MBS   ADL's   Cont to be limited with participation; total A toileting; max A UB bathing/dressing; +2 functional transfes  Set at lofty min A currently, may have to downgrade  OOB tolerance; ADL re-training; functional transfers   Mobility   intermittent Min assist bed mobility, min-mod assist transfers with RW. gait up to 32ft with RW and min-mod assist to manage AD.  Pt continues to be limited in particpation due to N/v and dizziness.   Min-supervision assist with LRAD for all mobility tasks  endurance, activity particiation, transfers, gait, WC mobility, safety, balance neuromotor control.     Communication   PMSV, tolerating with all therapies and with supervision from daughter   min assist   intelligibility and increased vocal intensity   Safety/Cognition/ Behavioral Observations  mod-max assist  min assist  basic cognition    Pain   Denies pain or need for medication this shift  Remain painfree   Assess need for pain medication QS and prn with f/u assessment documented   Skin   GT irritation   Maintain skin intergrity prevent skin irritation and infection         *See Care Plan and progress notes for long and short-term goals.     Barriers to Discharge  Current Status/Progress Possible Resolutions Date Resolved   Physician    Medical stability;Decreased caregiver support;Trach  therapy compliance  poor progress toward goals         Nursing                  PT                    OT                  SLP                SW   Daughter taking FMLA            Discharge Planning/Teaching Needs:  Daughter has a FMLA to assist with pt's care upon DC. Husband has been in and observes in therapies unsure how much assist he can provide.      Team Discussion:  Goals min assist wheelchair level-needs to be pushed to participate in therapies due to gasteroparesis. Will need continuous tube feeds at discharge. Neuro-psych seeing for coping. Capping trach today and hopeful to get out prior to DC home. Pt plans to do as much as she can before going home. Daughter here to attend therapies with her.  Revisions to Treatment Plan:  DC 9/21    Continued Need for Acute Rehabilitation Level of Care: The patient requires daily medical management by a physician with specialized training in physical medicine and rehabilitation for the following conditions: Daily direction of a multidisciplinary physical rehabilitation program to ensure safe treatment while eliciting the highest outcome that is of practical value to the patient.: Yes Daily medical management of patient stability for  increased activity during participation in an intensive rehabilitation regime.: Yes Daily analysis of laboratory values and/or radiology reports with any subsequent need for medication adjustment of medical intervention for : Neurological problems;Nutritional problems  Elanda Garmany, Gardiner Rhyme 07/01/2017, 11:08 AM

## 2017-06-30 NOTE — Progress Notes (Signed)
Occupational Therapy Session Note  Patient Details  Name: [name] MRN: [id number] Date of Birth: [birth year]  Today's Date: 06/30/2017 OT Individual Time: 6294-7654 OT Individual Time Calculation (min): 55 min    Short Term Goals: Week 2:  OT Short Term Goal 1 (Week 2): Pt will complete toilet transfer with max A in order to decrease burden of care OT Short Term Goal 2 (Week 2): Pt will recall and utilize hemi dressing technique with 1 VC OT Short Term Goal 3 (Week 2): Pt will complete sit <> stand at sink with max A in prep for LB   Skilled Therapeutic Interventions/Progress Updates:    Pt seen for OT ADL bathing/dressing session. Pt in supine upon arrival, agreeable to tx session. She was able to compelte pericare hygiene from bed level and rolled with min A for clean brief to be donned. She transferred to EOB using hospital bed functions with multimodal cues for technique and min A to bring trunk upright. Completed squat pivot transfer to w/c with max A with +2 available for safety.  She completed bathing/dressing from w/c level at sin, able to use R UE independently today vs hand over hand required in previous sessions. She required max cuing for clothing orientation/ management though was able to correctly recall hemi dressing techniques.  She stood at sink with mod A x2 trials for clothing management to be completed total A. Pt tolerated ~10 seconds of static standing with B UE support each trial before requiring seated rest break. Multmiodal cuing for upright posture. Pt left seated in w/c at end of session with daughter present and all needs in reach.  Pt's vitals remained WFL throughout session on RA. Attempted use of PMSV which pt tolerated well when donned, however, pt coughed it off multiple times during session. Therapist suctioned outside of trach for thick secretions, RN made aware.  Pt and daughter educated throughout session regarding importance of participation with  therapies and d/c planning.   Therapy Documentation Precautions:  Precautions Precautions: Fall Precaution Comments: cortrak, trach Restrictions Weight Bearing Restrictions: No Pain:    See Function Navigator for Current Functional Status.   Therapy/Group: Individual Therapy  Lewis, Madonna Flegal C 06/30/2017, 7:01 AM

## 2017-06-30 NOTE — Progress Notes (Signed)
Physical Therapy Session Note  Patient Details  Name: [name] MRN: [id number] Date of Birth: 14-Dec-1950  Today's Date: 06/30/2017 PT Individual Time: 1400-1500 PT Individual Time Calculation (min): 60 min   Short Term Goals: Week 1:  PT Short Term Goal 1 (Week 1): Pt will transfer with max assist PT Short Term Goal 1 - Progress (Week 1): Progressing toward goal PT Short Term Goal 2 (Week 1): Pt will initiate gait training with PT PT Short Term Goal 2 - Progress (Week 1): Not met PT Short Term Goal 3 (Week 1): Pt will tolerate out of bed in w/c x2 hours outside of therapy time.  PT Short Term Goal 3 - Progress (Week 1): Progressing toward goal Week 2:  PT Short Term Goal 1 (Week 2): pt will perform functional transfers with max A PT Short Term Goal 2 (Week 2): pt will tolerate out of bed in w/c x 2 hours per day not in therapy time  Skilled Therapeutic Interventions/Progress Updates:     Pt received supine in bed and agreeable to PT. Supine>sit transfer with min  assist and min cues for use of bed features.    Stand pivot transfer with min assist and BUE support on therapist. Pt able to verbalize steps for safe transfer, requiring only min cues for safety  Sit<>stand in parallel bars x 5 with mod assist progressing to min assist from PT.  pregait stepping to target x 5BLE. Min assist from PT and moderate mulitmodal cues for improved weight shifting over the RLE   Gait in parallel bars with min assist x 71f. PT required to provide max cues for sequencing and UE placement.   Gait with RW x 6 ft and x 133fwith min-mod assist for AD management as well as max cues for sequencing in turns.   Pt returned to room and performed stand pivot transfer to bed with min assist and RW. Sit>supine completed with min assist to control the RLE   Supine NMR for stepping pattern on RLE. Hip/knee flexion followed by knee extension to improve nm control out of synergy.    Pt left supine in bed  with call bell in reach and all needs met.   Therapy Documentation Precautions:  Precautions Precautions: Fall Precaution Comments: cortrak, trach Restrictions Weight Bearing Restrictions: No Vital Signs: Therapy Vitals Pulse Rate: 66 Resp: 16 Oxygen Therapy SpO2: 98 % O2 Device: Not Delivered (corked trach) Pain: Pain Assessment Pain Assessment: No/denies pain Faces Pain Scale: Hurts little more Pain Type: Acute pain Pain Location: Abdomen Pain Descriptors / Indicators: Aching Pain Intervention(s): Medication (See eMAR)   See Function Navigator for Current Functional Status.   Therapy/Group: Individual Therapy  AuLorie Phenix/09/2017, 3:03 PM

## 2017-07-01 ENCOUNTER — Inpatient Hospital Stay (HOSPITAL_COMMUNITY): Payer: Medicare Other | Admitting: Speech Pathology

## 2017-07-01 ENCOUNTER — Inpatient Hospital Stay (HOSPITAL_COMMUNITY): Payer: Medicare Other | Admitting: Occupational Therapy

## 2017-07-01 ENCOUNTER — Inpatient Hospital Stay (HOSPITAL_COMMUNITY): Payer: Medicare Other | Admitting: Physical Therapy

## 2017-07-01 LAB — BASIC METABOLIC PANEL
Anion gap: 7 (ref 5–15)
BUN: 24 mg/dL — AB (ref 6–20)
CALCIUM: 8.6 mg/dL — AB (ref 8.9–10.3)
CO2: 25 mmol/L (ref 22–32)
Chloride: 101 mmol/L (ref 101–111)
Creatinine, Ser: 0.88 mg/dL (ref 0.44–1.00)
GFR calc Af Amer: >60 mL/min (ref >60)
GFR calc non Af Amer: >60 mL/min (ref >60)
GLUCOSE: 227 mg/dL — AB (ref 65–99)
Potassium: 3.3 mmol/L — ABNORMAL LOW (ref 3.5–5.1)
SODIUM: 133 mmol/L — AB (ref 135–145)

## 2017-07-01 LAB — GLUCOSE, CAPILLARY
GLUCOSE-CAPILLARY: 176 mg/dL — AB (ref 65–99)
GLUCOSE-CAPILLARY: 181 mg/dL — AB (ref 65–99)
Glucose-Capillary: 182 mg/dL — ABNORMAL HIGH (ref 65–99)
Glucose-Capillary: 185 mg/dL — ABNORMAL HIGH (ref 65–99)
Glucose-Capillary: 190 mg/dL — ABNORMAL HIGH (ref 65–99)
Glucose-Capillary: 213 mg/dL — ABNORMAL HIGH (ref 65–99)
Glucose-Capillary: 218 mg/dL — ABNORMAL HIGH (ref 65–99)

## 2017-07-01 MED ORDER — POTASSIUM CHLORIDE 20 MEQ PO PACK
20.0000 meq | PACK | Freq: Every day | ORAL | Status: DC
  Administered 2017-07-01 – 2017-07-08 (×6): 20 meq via ORAL
  Filled 2017-07-01 (×8): qty 1

## 2017-07-01 MED ORDER — INSULIN GLARGINE 100 UNIT/ML ~~LOC~~ SOLN
40.0000 [IU] | Freq: Every day | SUBCUTANEOUS | Status: DC
  Administered 2017-07-02 – 2017-07-03 (×2): 40 [IU] via SUBCUTANEOUS
  Filled 2017-07-01 (×2): qty 0.4

## 2017-07-01 NOTE — Progress Notes (Signed)
Speech Language Pathology Daily Session Note  Patient Details  Name: [name] MRN: [id number] Date of Birth: Jul 03, 1951  Today's Date: 07/01/2017   Skilled treatment session #1 SLP Individual Time: 0700-0730 SLP Individual Time Calculation (min): 30 min   Skilled treatment session #2 SLP Individual Time: 1030-1100 SLP Individual Time Calculation (min): 30 min  Short Term Goals: Week 2: SLP Short Term Goal 1 (Week 2): Pt will consume trials of honey thick via spoon and puree with minimal overt s/s of aspiration to demonstrate readiness for repeat instrumental swallow study. SLP Short Term Goal 2 (Week 2): Pt will increase vocal intensity with Mod A cues to achieve ~ 90% intelligiblity at the phrase level.  SLP Short Term Goal 3 (Week 2): Pt will increase sustained attention to task for ~ 20 minutes with Mod A cues.  SLP Short Term Goal 4 (Week 2): Pt will demonstrate basic problem solving for familiar tasks with Mod A cues.   Skilled Therapeutic Interventions:   Skilled treatment session #1 focused on dysphagia and cognition goals. SLP facilitated session by providing skilled observation of pt consuming ice chips. Pt is adverse to ice chips as they "make her sick." Max encouragement required. Pt is known silent aspirator but demonstrated good hyolaryngeal excursion. Delayed cough present at the end of session but was stronger in nature. Pt is currently capped and tolerated without any decline in vitals. Pt was able to achieve sustained attention for 30 minutes with Min A cues to supervision cues. Pt able to achieve ~ 75% intelligibility at the phrase level with Min A cues. Pt left upright in bed, bed alarm on and daughter present. Continue per current plan of care.   Skilled treatment session #2 focused on dysphagia and cognition goals. SLP facilitated session by providing skilled observation of pt consuming ice chips. Pt appears appropriate for repeat instrumental study and education  provided on study itself. All questions answered to pt' satisfaction. Pt left upright in bed and daughter present.      Function:  Eating Eating   Modified Consistency Diet: No Eating Assist Level: Helper performs IV, parenteral or tube feed       Helper Brings Food to Mouth: Every scoop   Cognition Comprehension Comprehension assist level: Understands basic 50 - 74% of the time/ requires cueing 25 - 49% of the time  Expression Expression assistive device: Talk trach valve Expression assist level: Expresses basic 50 - 74% of the time/requires cueing 25 - 49% of the time. Needs to repeat parts of sentences.  Social Interaction Social Interaction assist level: Interacts appropriately 25 - 49% of time - Needs frequent redirection.  Problem Solving Problem solving assist level: Solves basic 25 - 49% of the time - needs direction more than half the time to initiate, plan or complete simple activities  Memory Memory assist level: Recognizes or recalls 25 - 49% of the time/requires cueing 50 - 75% of the time    Pain    Therapy/Group: Individual Therapy  Azyah Flett 07/01/2017, 1:29 PM

## 2017-07-01 NOTE — Plan of Care (Signed)
Problem: RH Eating Goal: LTG Patient will perform eating w/assist, cues/equip (OT) LTG: Patient will perform eating with assist, with/without cues using equipment (OT)  Outcome: Not Applicable Date Met: 47/84/12 Goal d/c as pt NPO with PEG tube. Lain Tetterton Lewis, OTR/L  Problem: RH Dressing Goal: LTG Patient will perform lower body dressing w/assist (OT) LTG: Patient will perform lower body dressing with assist, with/without cues in positioning using equipment (OT)  Goal downgraded due to pt progress. Napoleon Form, OTR/L

## 2017-07-01 NOTE — Progress Notes (Signed)
Occupational Therapy Weekly Progress Note  Patient Details  Name: [name] MRN: [id number] Date of Birth: 09-Feb-1951  Beginning of progress report period: June 24, 2017 End of progress report period: July 01, 2017  Today's Date: 07/01/2017 OT Individual Time: 7829-5621 OT Individual Time Calculation (min): 70 min    Patient has met 3 of 3 short term goals.  Pt with slightly better participation this week compared to previous reporting period. She is now tolerating OOB sessions and has demonstrated ability to complete functional transfers and short distance ambulation with min A overall. She requires significantly increased time and encouragement to complete all tasks as well as extended rest breaks following each task. Pt's daughter has been present during tx sessions and offers pt encouragement, however, unsure if daughter understands extent of pt's cognitive/ behavioral deficits. Cont with POC for d/c home with daughter and husband providing assist.   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  Patient progressing toward long term goals..  Plan of care revisions: Eating goal d/c as pt NPO with PEG tube. Have also downgraded LB dressing goal to mod A. .  OT Short Term Goals Week 2:  OT Short Term Goal 1 (Week 2): Pt will complete toilet transfer with max A in order to decrease burden of care OT Short Term Goal 1 - Progress (Week 2): Met OT Short Term Goal 2 (Week 2): Pt will recall and utilize hemi dressing technique with 1 VC OT Short Term Goal 2 - Progress (Week 2): Met OT Short Term Goal 3 (Week 2): Pt will complete sit <> stand at sink with max A in prep for LB  OT Short Term Goal 3 - Progress (Week 2): Met Week 3:  OT  Short Term Goal 1 (Week 3): STG=LTG due to LOS  Skilled Therapeutic Interventions/Progress Updates:    Pt sseen for OT session focusing on ADL re-training and functional transfers. Pt in supine upon arrival, awake and agreeable to tx session. She was very adiment that she did not want to shower, stating it made her anxious regarding the trransfer and sitting balance required for task. Pt agreeable to practicing simulated shower today and real shower tomorrow. She transferred to EOB with min A using hospital bed functions. Seated EOB, pt dressed with mod cuing for sequencing/ technique, suspecting due to self limiting behaviors vs. True cognitive deficits. She stood with min A to RW at EOB for clothing management, requiring assist to pull over R hip.  Following seated rest break, she ambulated into bathroom with min A, mod A required for simulated transfer into shower stall. She then ambulated to toilet and completed toileting task with assist to pull pants up. Upon ambulating ot walk out of bathroom pt began having difficulty managing RW and following commands. Safely returned to w/c and BP 144/73. Following break pt requesting return to bed. Left in supine with all needs in reach and daughter present.  Pt cont to require extra encouragement for participation today due to self limiting behaviors Therapy Documentation Precautions:  Precautions Precautions: Fall Precaution Comments: cortrak, trach Restrictions Weight Bearing Restrictions: No Pain:   No/ denies pain  See Function Navigator for Current Functional Status.   Therapy/Group: Individual Therapy  Lewis, Royce Stegman C 07/01/2017, 6:55 AM

## 2017-07-01 NOTE — Progress Notes (Signed)
Occupational Therapy Session Note  Patient Details  Name: [name] MRN: [id number] Date of Birth: 05-02-1951  Today's Date: 07/01/2017 OT Individual Time: 5686-1683 OT Individual Time Calculation (min): 30 min Make up time   Short Term Goals: Week 3:  OT Short Term Goal 1 (Week 3): STG=LTG due to LOS  Skilled Therapeutic Interventions/Progress Updates:    Pt seen for make up session with focus on RUE NMR.  Pt received in bed reporting not having a good day but willing to engage in fine motor tasks at bed level.  Engaged in thumb opposition to first digit with focus on pinch grasp to obtain items.  Utilized hand over hand to increase movement and provide encouragement.  Pt tearful during session due to not feeling well, reinforced progress towards goals with pt's daughter also providing encouragement.    Therapy Documentation Precautions:  Precautions Precautions: Fall Precaution Comments: cortrak, trach Restrictions Weight Bearing Restrictions: No General:   Vital Signs: Therapy Vitals Temp: 98.4 F (36.9 C) Temp Source: Oral Pulse Rate: 99 Resp: 17 BP: (!) 148/68 Patient Position (if appropriate): Lying Oxygen Therapy SpO2: 98 % O2 Device: Not Delivered Pain: Pain Assessment Pain Assessment: 0-10 Pain Score: 7  Pain Location: Head Pain Orientation: Right Pain Descriptors / Indicators: Aching Pain Onset: Gradual Patients Stated Pain Goal: 4  See Function Navigator for Current Functional Status.   Therapy/Group: Individual Therapy  Simonne Come 07/01/2017, 3:33 PM

## 2017-07-01 NOTE — Progress Notes (Signed)
[facility] PHYSICAL MEDICINE & REHABILITATION     PROGRESS NOTE  Subjective/Complaints: Patient remained Yesterday and last night, had some nausea this morning after medications, but no vomiting. Had an excellent day in therapy ROS: limited  Objective:  Vital Signs: Blood pressure (!) 105/57, pulse 82, temperature 97.6 F (36.4 C), temperature source Oral, resp. rate 18, height 5\' 4"  (1.626 m), weight 77.7 kg (171 lb 4.8 oz), SpO2 100 %. No results found.  Recent Labs  06/30/17 0444  WBC 10.2  HGB 9.6*  HCT 30.5*  PLT 414*    Recent Labs  07/01/17 0440  NA 133*  K 3.3*  CL 101  GLUCOSE 227*  BUN 24*  CREATININE 0.88  CALCIUM 8.6*   CBG (last 3)   Recent Labs  06/30/17 1946 07/01/17 0013 07/01/17 0502  GLUCAP 120* 182* 213*    Wt Readings from Last 3 Encounters:  06/30/17 77.7 kg (171 lb 4.8 oz)  06/18/17 76.4 kg (168 lb 6.9 oz)  01/15/16 79.3 kg (174 lb 12.8 oz)    Physical Exam:  BP (!) 105/57 (BP Location: Left Arm)   Pulse 82   Temp 97.6 F (36.4 C) (Oral)   Resp 18   Ht 5\' 4"  (1.626 m)   Wt 77.7 kg (171 lb 4.8 oz)   SpO2 100%   BMI 29.40 kg/m  Constitutional: She appears well-developed and well-nourished.  HENT: Normocephalic and atraumatic.  Eyes: EOM are normal. No discharge Neck: CFS #6 without drainage today Cardiovascular: RRR. No JVD Respiratory: normal effort. Fair flow, rhonchi heard GI: distended , reduced BS, NT  +tympany Musculoskeletal: She exhibits no edema or tenderness.  Neurological: Alert  Left facial droop with dysarthric speech.  Right sided weakness with decrease in motor control.  Right homonymous hemianopsia RUE grossly 3- to 3/5.Marland Kitchen  RLE grossly 4-/5.  Skin: Skin is warm and dry. She is not diaphoretic.  Psychiatric: affect flat, processing delayed Assessment/Plan: 1. 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 Physiatrist is providing close team supervision and 24 hour management of active medical problems listed below. Physiatrist and rehab team continue to assess barriers to discharge/monitor patient progress toward functional and medical goals.  Function:  Bathing Bathing position   Position: Wheelchair/chair at sink  Bathing parts Body parts bathed by patient: Chest, Abdomen, Right arm, Left arm, Front perineal area, Right upper leg, Left upper leg Body parts bathed by helper: Buttocks, Back, Right lower leg, Left lower leg  Bathing assist Assist Level:  (MaxA )      Upper Body Dressing/Undressing Upper body dressing   What is the patient wearing?: Pull over shirt/dress     Pull over shirt/dress - Perfomed by patient: Thread/unthread left sleeve, Put head through opening, Pull shirt over trunk Pull over shirt/dress - Perfomed by helper: Thread/unthread right sleeve        Upper body assist Assist Level: Touching or steadying assistance(Pt > 75%)   Set up : To obtain clothing/put away  Lower Body Dressing/Undressing Lower body dressing   What is the patient wearing?: Pants, Non-skid slipper socks       Pants- Performed by helper: Thread/unthread right pants leg, Thread/unthread left pants leg, Pull pants up/down   Non-skid slipper socks- Performed by helper: Don/doff right sock, Don/doff left sock  Lower body assist Assist for lower body dressing: 2 Helpers      Toileting Toileting Toileting activity did not occur: Refused Toileting steps completed by patient: Performs perineal hygiene Toileting steps completed by helper: Adjust clothing prior to toileting, Adjust clothing after toileting    Toileting assist Assist level:  (max A)   Transfers Chair/bed transfer   Chair/bed transfer method: Squat pivot Chair/bed transfer assist level: Touching or steadying assistance (Pt > 75%) Chair/bed transfer  assistive device: Mechanical lift Mechanical lift: Stedy   Locomotion Ambulation Ambulation activity did not occur: Safety/medical concerns   Max distance: 10' Assist level: Touching or steadying assistance (Pt > 75%)   Wheelchair          Cognition Comprehension Comprehension assist level: Understands basic 75 - 89% of the time/ requires cueing 10 - 24% of the time  Expression Expression assist level: Expresses basic 50 - 74% of the time/requires cueing 25 - 49% of the time. Needs to repeat parts of sentences.  Social Interaction Social Interaction assist level: Interacts appropriately 25 - 49% of time - Needs frequent redirection.  Problem Solving Problem solving assist level: Solves basic 25 - 49% of the time - needs direction more than half the time to initiate, plan or complete simple activities  Memory Memory assist level: Recognizes or recalls 25 - 49% of the time/requires cueing 50 - 75% of the time    Medical Problem List and Plan:  1. 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 DVT Prophylaxis/Anticoagulation: Pharmaceutical: Lovenox PLT 414K on 9/12 3. Headaches/Pain Management:   Topamax started 9/1--pt with persistent headaches   -increase to 50mg  twice a day, also on Diamox, Will follow-up with Dr. Jaynee Eagles 4. Mood: LCSW to follow for evaluation and support.  5. Neuropsych: This patient is not capable of making decisions on her own behalf.  6. Skin/Wound Care: Routine pressure relief measures.  7. Fluids/Electrolytes/Nutrition: Monitor I/Os Per dietary, via PEG tube 8. MSSA PNA: Completed treatment 7 day course of cefazolin on 8/30. Continue to monitor with strict aspiration precautions--HOB > 30 degrees at all times.  9. VDRF: Tolerating PMSV with supervision. Secretions felt to be in part due to  GERD.   -continue #6 trach until secretion mgt/swallow better 10 HTN: Monitor BP bid--on lopressor bid and catapres Vitals:   07/01/17 0319 07/01/17 0520  BP:  (!) 105/57  Pulse: 72 82  Resp: 18 18  Temp:  97.6 F (36.4 C)  SpO2: 98% 100%    bp controlled . 07/01/2017 11. ABLA: Improved with transfusion.   Hemoglobin 9.6 on 9/13 Continue to monitor 12. Anxiety disorder:Team to provide ego support. Daughter staying to encourage patient. Palliative following for support and placed on IV ativan tid for symptoms management--decrease to prn due to sedation.  13. T2DM: Hgb A1C-  - CBG (last 3)   Recent Labs  06/30/17 1946 07/01/17 0013 07/01/17 0502  GLUCAP 120* 182* 213*  Fair control   -increase lantus to 40u daily . a.m. blood sugars greater than 200, on TF after PEG  -continue scheduled novolog 14. 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 Leucocytosis:resolved .Monitor for signs of infection.   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 B-MCA  stenosis/L-ICA stenosis s/p revascularization: On ASA/Plavix.  24. Acute urinary retention:   Foley out  Ua+, ucx with multispecies-   LOS (Days) 13 A FACE TO FACE EVALUATION WAS PERFORMED  Charlett Blake 07/01/2017 7:18 AM

## 2017-07-02 ENCOUNTER — Inpatient Hospital Stay (HOSPITAL_COMMUNITY): Payer: Medicare Other | Admitting: Physical Therapy

## 2017-07-02 ENCOUNTER — Inpatient Hospital Stay (HOSPITAL_COMMUNITY): Payer: Medicare Other | Admitting: Speech Pathology

## 2017-07-02 ENCOUNTER — Inpatient Hospital Stay (HOSPITAL_COMMUNITY): Payer: Medicare Other | Admitting: Occupational Therapy

## 2017-07-02 ENCOUNTER — Inpatient Hospital Stay (HOSPITAL_COMMUNITY): Payer: Medicare Other

## 2017-07-02 LAB — GLUCOSE, CAPILLARY
GLUCOSE-CAPILLARY: 215 mg/dL — AB (ref 65–99)
GLUCOSE-CAPILLARY: 141 mg/dL — AB (ref 65–99)
Glucose-Capillary: 104 mg/dL — ABNORMAL HIGH (ref 65–99)
Glucose-Capillary: 88 mg/dL (ref 65–99)
Glucose-Capillary: 140 mg/dL — ABNORMAL HIGH (ref 65–99)

## 2017-07-02 MED ORDER — FREE WATER
100.0000 mL | Freq: Three times a day (TID) | Status: DC
  Administered 2017-07-02 – 2017-07-03 (×5): 100 mL

## 2017-07-02 MED ORDER — INSULIN ASPART 100 UNIT/ML ~~LOC~~ SOLN: 0.0000 [IU] | Freq: Every day | SUBCUTANEOUS | Status: DC

## 2017-07-02 MED ORDER — INSULIN ASPART 100 UNIT/ML ~~LOC~~ SOLN
0.0000 [IU] | Freq: Three times a day (TID) | SUBCUTANEOUS | Status: DC
  Administered 2017-07-02: 2 [IU] via SUBCUTANEOUS
  Administered 2017-07-03: 5 [IU] via SUBCUTANEOUS
  Administered 2017-07-04 – 2017-07-05 (×2): 3 [IU] via SUBCUTANEOUS
  Administered 2017-07-06: 2 [IU] via SUBCUTANEOUS
  Administered 2017-07-07: 5 [IU] via SUBCUTANEOUS
  Administered 2017-07-08: 3 [IU] via SUBCUTANEOUS

## 2017-07-02 MED ORDER — GLUCERNA SHAKE PO LIQD
237.0000 mL | Freq: Three times a day (TID) | ORAL | Status: DC
  Administered 2017-07-02 – 2017-07-08 (×13): 237 mL via ORAL

## 2017-07-02 MED ORDER — VITAL 1.5 CAL PO LIQD
1000.0000 mL | ORAL | Status: DC
  Administered 2017-07-02: 1000 mL
  Filled 2017-07-02 (×2): qty 1000

## 2017-07-02 NOTE — Progress Notes (Signed)
Physical Therapy Session Note  Patient Details  Name: [name] MRN: [id number] Date of Birth: [birth year]  Today's Date: 07/02/2017 PT Individual Time: 1005-1100 AND 1415-1510 PT Individual Time Calculation (min): 55 min AND 55  Short Term Goals: Week 2:  PT Short Term Goal 1 (Week 2): pt will perform functional transfers with max A PT Short Term Goal 2 (Week 2): pt will tolerate out of bed in w/c x 2 hours per day not in therapy time  Skilled Therapeutic Interventions/Progress Updates:  Session 1 Pt received supine in bed and agreeable to PT. Supine>sit transfer with min assist and min cues for use of bed features. Reciprocal scooting to EOB with moderate cues and min assist with max cues for sequencing.   Stand pivot transfer with min assist from PT to improve anterior weight shift and AD management.   PT transported pt rehab gym with total assist for time management. Car transfer training with min assist from Pt and moderate cues for AD management and safety.   Stair training instructed by PT with with min assist for forward ascent and backward descent with moderate multimodal cues for sequencing, safety and gait pattern.   Gait training x 31f  With RW to room with min assist from PT min cues for safety in turns as well as gait pattern to improve control of the RLE and reduce ataxia.   Ambulatory transfer to bed with min assist and RW. Sit>supine completed with supervision assist and left supine in bed with call bell in reach and all needs met.    Session 2  Pt received supine in bed and agreeable to PT. Supine>sit transfer with min assist and min cues for improved use of RUE to come to sitting EOB.  [facility] from PT with min cues for proper UE placement to improve safety.   Gait training instructed by PT with RW x 574fwith min assist and min cues for gait pattern, improved pursed lip breathing, and AD management in turns.  Additional gait training with rollator x 1516fith min assist. Pt reports feeling very instable with rollator vs RW.   WC mobility training x [id number] min assist and moderate cues from PT for improved use of the RUE. Pt reports being unable to continue with WC propulsion due to UE fatigue.   Kinetron reciprocal movement training 4 x 1 minute 50cm/sec. Moderate cues from PT for equalized symmetry of movement and increased postural control.   Pt returned to room and performed stand pivot transfer to bed with min assist with RW. Sit>supine completed with supervision assist, and pt left supine in bed with call bell in reach and all needs met.          Therapy Documentation Precautions:  Precautions Precautions: Fall Precaution Comments: cortrak, trach Restrictions Weight Bearing Restrictions: No    Vital Signs: Therapy Vitals Temp: 97.7 F (36.5 C) Temp Source: Oral Pulse Rate: 76 Resp: 18 BP: 139/61 Patient Position (if appropriate): Sitting Oxygen Therapy SpO2: 99 % O2 Device: Not Delivered Pain:   0/10 at rest  See Function Navigator for Current Functional Status.   Therapy/Group: Individual Therapy  AustLorie Phenix4/2018, 3:22 PM

## 2017-07-02 NOTE — Progress Notes (Signed)
Physical Therapy Session Note  Patient Details  Name: [name] MRN: [id number] Date of Birth: 11/16/1950  Today's Date: 07/01/2017 PT Individual Time:1415-1515   60 min   Short Term Goals: Week 2:  PT Short Term Goal 1 (Week 2): pt will perform functional transfers with max A PT Short Term Goal 2 (Week 2): pt will tolerate out of bed in w/c x 2 hours per day not in therapy time  Skilled Therapeutic Interventions/Progress Updates:   Pt received supine in bed and agreeable to PT with max encouragement. Pt reports severe HA and demonstrates uncontrolled tremor in BLE. Pt educated on relaxation techniques and controlled breathing to reduce LE spasm.  Supine>sit transfer with min assist and moderate cues for use of rail on bed.   Sit<>stand with min-mod assist x 2 and RW and moderate cues for UE placement and anterior weight shift. Side stepping with RW 2 x 4 ft with min assist from PT and moderate cues for gait pattern.   Pt returned to supine with min assist to control the RLE and min cues for safety.   Supine NMR with emphasis on decreased speed, and improved accuracy of movement.  SAQ, SLR, hip adduction/abduction, heel slides. All completed x 10 BLE.   PT educated in Vestibular gaze stabilization exercises and horizontal and vertical rotation with instruction for how to properly maintain speed for 3 bouts of 30 sec.   Pt left supine in bed with call bell in reach and all needs met.         Therapy Documentation Precautions:  Precautions Precautions: Fall Precaution Comments: cortrak, trach Restrictions Weight Bearing Restrictions: No Vital Signs: Therapy Vitals Temp: 98.2 F (36.8 C) Temp Source: Oral Pulse Rate: 77 Resp: 18 BP: 139/60 Patient Position (if appropriate): Lying Oxygen Therapy SpO2: 99 % O2 Device: Not Delivered FiO2 (%): 21 % Pain: Pain Assessment Pain Score: 6/10 HA.  Mobility:     See Function Navigator for Current Functional  Status.   Therapy/Group: Individual Therapy  Austin E Tucker 07/02/2017, 6:54 AM  

## 2017-07-02 NOTE — Progress Notes (Signed)
Occupational Therapy Session Note  Patient Details  Name: [name] MRN: [id number] Date of Birth: 1951/03/18  Today's Date: 07/02/2017 OT Individual Time: 0700-0800 OT Individual Time Calculation (min): 60 min    Short Term Goals: Week 3:  OT Short Term Goal 1 (Week 3): STG=LTG due to LOS  Skilled Therapeutic Interventions/Progress Updates:    Pt seen for OT ADL bathing/dressing session. Pt in supine upon arrival, easily awoken and agreeable to tx session. She transferred to EOB with min A fusing hospital bed functions. She ambulated throughout room with min A, occasional mod and assist for steering of RW in functional context.  She bathed seated on tub transfer bench, requiring max cuing for sequencing as pt stating "what do I do now?" when presented with washcloth. This OT discussed with SLP cogntiive vs behavioral limitations of pt for functional tasks. SLP assisted OT in part of session, providing education to pt and caregiver regarding above problem.  Pt dressed seated in w/c at sink, able to dress UB/LB with min A overall and mod-max cuing for problem solving clothing orientation and self correcting when threaded incorrectly. She stood at [facility] with min and min A to pull pants over R hip, able to maintain dynamic standing balance without UE support when attempting to pull pants up entirely. Pt left seated in w/c at end of session, NT and daughter present.   Therapy Documentation Precautions:  Precautions Precautions: Fall Precaution Comments: cortrak, trach Restrictions Weight Bearing Restrictions: No Pain: Pain Assessment Pain Score: No/ denies pain  See Function Navigator for Current Functional Status.   Therapy/Group: Individual Therapy  Lewis, Shankar Silber C 07/02/2017, 6:39 AM

## 2017-07-02 NOTE — Progress Notes (Signed)
MBS results noted. Will change tube feeds to 10 hours at nights and decrease water flushes to 100 cc qid. Start calorie count to monitor intake. Change SSI to ac/hs. Will check lytes Tuesday.

## 2017-07-02 NOTE — Progress Notes (Signed)
Instructed on procedure. HOB less than 45*. Pt held breath during removal. Line removed with pressure held for 5 min. Pressure drsg applied to site. Instructed to remain in bed for 9min, and to keep drsg CDI and notify nurse with any complications. VU. Fran Lowes, RN VAST

## 2017-07-02 NOTE — Progress Notes (Signed)
Speech Language Pathology Weekly Progress and Session Note  Patient Details  Name: [name] MRN: [id number] Date of Birth: 1951-05-26  Beginning of progress report period: June 25, 2017 End of progress report period: July 02, 2017  Today's Date: 07/02/2017 SLP Individual Time: 0945-1000 SLP Individual Time Calculation (min): 15 min  Short Term Goals: Week 2: SLP Short Term Goal 1 (Week 2): Pt will consume trials of honey thick via spoon and puree with minimal overt s/s of aspiration to demonstrate readiness for repeat instrumental swallow study. SLP Short Term Goal 1 - Progress (Week 2): Met SLP Short Term Goal 2 (Week 2): Pt will increase vocal intensity with Mod A cues to achieve ~ 90% intelligiblity at the phrase level.  SLP Short Term Goal 2 - Progress (Week 2): Not met SLP Short Term Goal 3 (Week 2): Pt will increase sustained attention to task for ~ 20 minutes with Mod A cues.  SLP Short Term Goal 3 - Progress (Week 2): Met SLP Short Term Goal 4 (Week 2): Pt will demonstrate basic problem solving for familiar tasks with Mod A cues.  SLP Short Term Goal 4 - Progress (Week 2): Not met    New Short Term Goals: Week 3: SLP Short Term Goal 1 (Week 3): Pt will increase vocal intensity with Mod A cues to achieve ~ 90% intelligiblity at the phrase to sentence level.  SLP Short Term Goal 2 (Week 3): Given Min A cues, pt will self-correct during times of decreased intelligibility.  SLP Short Term Goal 3 (Week 3): Pt will increase sustained attention to task for ~ 30 minutes with Min A cues.  SLP Short Term Goal 4 (Week 3): Pt will demonstrate basic problem solving for familiar tasks related to ADLs with Mod A cues.  SLP Short Term Goal 5 (Week 3): Pt will consume current diet without overt s/s of aspiration and supervision cues for small sips of thin liquids.  SLP Short Term Goal 6 (Week 3): Pt will consume trials of dysphagia 3 with complete oral clearing and Mod A cues for  use of compensatory strategies.   Weekly Progress Updates: Pt with some progress this reporting period as evidenced by meeting 2 of 4 STGs. Pt with progress towards dysphagia goals and is upgraded to dysphagia 2 with thin liquids via cup with small sips. Pt is also tolerating having her trach capped without decline in vitals. Pt's speech is more intelligibility but cognitively needs reminders to monitor her ability to communicate. Pt continues to require skilled ST to target basic cognitive function and communication abilities. Anticipate that pt will require Min A at discharge with follow-up ST services.      Intensity: Minumum of 1-2 x/day, 30 to 90 minutes Frequency: 3 to 5 out of 7 days Duration/Length of Stay: 3 weeks Treatment/Interventions: Cognitive remediation/compensation;Cueing hierarchy;Dysphagia/aspiration precaution training;Functional tasks;Patient/family education;Therapeutic Activities;Internal/external aids   Daily Session  Skilled Therapeutic Interventions: Skilled treatment session focused on self-care education regarding today's MBS. Results shared with nursing anddaughter and recommend diet upgrade. All voiced understanding.    Function:   Eating Eating   Modified Consistency Diet: No Eating Assist Level: Help with picking up utensils;Help managing cup/glass;Helper brings food to mouth       Helper [place] to Mouth: Occasionally   Cognition Comprehension Comprehension assist level: Understands basic 50 - 74% of the time/ requires cueing 25 - 49% of the time;Understands basic 75 - 89% of the time/ requires cueing 10 - 24% of  the time  Expression Expression assistive device: Talk trach valve Expression assist level: Expresses basic 50 - 74% of the time/requires cueing 25 - 49% of the time. Needs to repeat parts of sentences.  Social Interaction Social Interaction assist level: Interacts appropriately 25 - 49% of time - Needs frequent redirection.  Problem  Solving Problem solving assist level: Solves basic 25 - 49% of the time - needs direction more than half the time to initiate, plan or complete simple activities  Memory Memory assist level: Recognizes or recalls 50 - 74% of the time/requires cueing 25 - 49% of the time   General    Pain    Therapy/Group: Individual Therapy  Sai Zinn 07/02/2017, 12:13 PM

## 2017-07-02 NOTE — Progress Notes (Signed)
Modified Barium Swallow Progress Note  Patient Details  Name: [name] MRN: [id number] Date of Birth: 1951/05/17  Today's Date: 07/02/2017  Modified Barium Swallow completed.  Full report located under Chart Review in the Imaging Section.  Brief recommendations include the following:  Clinical Impression  Pt with increased overall oropharyngeal abilities over previous swallow study. Pt continues to have #6 cuffless trach which has been capped for > 2 days. Pt on room air. Pt currently presents with mild to moderate oral phase dysphagia and mild pharyngeal phase dysphagia. Pt's oral phase is c/b decreased oral manipulation of soft solid texutres and whole pill presented with applesauce. Pt spit out the pill and one piece of diced peach d/t decreased coordination and she also spit out 1 piece of diced peach. Pt's pharyngeal phase presents with sensory deficits that led to delayed swallow initiaiton to pyriform sinuses with thin liquids via spoon and cup. Pt with flash penetration which cleared during the swallow when thin liquids presented via cup. Pt with frank penetration x 1 with large cup sips. Pt sensed and able to clear with weak throat clear. Recommend upgrade to dysphagia 2 with thin liquids via cup, medicine crushed in applesauce or puree, full nursing supervision d/t cognitive deficits. Lurline Idol must be capped.    Swallow Evaluation Recommendations       SLP Diet Recommendations: Dysphagia 2 (Fine chop) solids;Thin liquid   Liquid Administration via: Cup   Medication Administration: Crushed with puree   Supervision: Patient able to self feed;Full supervision/cueing for compensatory strategies   Compensations: Minimize environmental distractions;Slow rate;Small sips/bites   Postural Changes: Seated upright at 90 degrees   Oral Care Recommendations: Oral care BID        Mattias Walmsley 07/02/2017,10:26 AM

## 2017-07-02 NOTE — Progress Notes (Signed)
[facility]     PROGRESS NOTE  Subjective/Complaints:  Patient did not sleep well last night, didn't get out of bed for therapy yesterday. Had some headaches yesterday, but these are doing better today. She gets nauseated with her medication administration. Lurline Idol has remained capped  ROS: Denies  vomiting this morning, no abdominal pain, no breathing problems.  Objective:  Vital Signs: Blood pressure (!) 123/59, pulse 78, temperature 98.2 F (36.8 C), temperature source Oral, resp. rate 20, height 5\' 4"  (1.626 m), weight 78.1 kg (172 lb 2.9 oz), SpO2 100 %. No results found.  Recent Labs  06/30/17 0444  WBC 10.2  HGB 9.6*  HCT 30.5*  PLT 414*    Recent Labs  07/01/17 0440  NA 133*  K 3.3*  CL 101  GLUCOSE 227*  BUN 24*  CREATININE 0.88  CALCIUM 8.6*   CBG (last 3)   Recent Labs  07/02/17 0001 07/02/17 0321 07/02/17 0802  GLUCAP 218* 215* 104*    Wt Readings from Last 3 Encounters:  07/02/17 78.1 kg (172 lb 2.9 oz)  06/18/17 76.4 kg (168 lb 6.9 oz)  01/15/16 79.3 kg (174 lb 12.8 oz)    Physical Exam:  BP (!) 123/59 (BP Location: Left Arm)   Pulse 78   Temp 98.2 F (36.8 C) (Oral)   Resp 20   Ht 5\' 4"  (1.626 m)   Wt 78.1 kg (172 lb 2.9 oz)   SpO2 100%   BMI 29.55 kg/m  Constitutional: She appears well-developed and well-nourished.  HENT: Normocephalic and atraumatic.  Eyes: EOM are normal. No discharge Neck: CFS #6 without drainage today Cardiovascular: RRR. No JVD Respiratory: normal effort. Fair flow, rhonchi heard GI: distended , reduced BS, NT  +tympany Musculoskeletal: She exhibits no edema or tenderness.  Neurological: Alert  Left facial droop with dysarthric speech.  Right sided weakness with decrease in motor control.  Right homonymous hemianopsia RUE grossly 3- to 3/5.Marland Kitchen  RLE grossly 4-/5.  Skin: Skin is warm and dry. She is not diaphoretic.  Psychiatric: affect flat, processing  delayed Assessment/Plan: 1. 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 Physiatrist is providing close team supervision and 24 hour management of active medical problems listed below. Physiatrist and rehab team continue to assess barriers to discharge/monitor patient progress toward functional and medical goals.  Function:  Bathing Bathing position   Position: Shower  Bathing parts Body parts bathed by patient: Chest, Abdomen, Right arm, Left arm, Front perineal area, Right upper leg, Left upper leg, Buttocks Body parts bathed by helper: Buttocks, Back, Right lower leg, Left lower leg  Bathing assist Assist Level: Touching or steadying assistance(Pt > 75%)      Upper Body Dressing/Undressing Upper body dressing   What is the patient wearing?: Pull over shirt/dress     Pull over shirt/dress - Perfomed by patient: Thread/unthread left sleeve, Put head through opening, Pull shirt over trunk Pull over shirt/dress - Perfomed by helper: Thread/unthread right sleeve        Upper body assist Assist Level: Touching or steadying assistance(Pt > 75%)   Set up : To obtain clothing/put away  Lower Body Dressing/Undressing Lower body dressing   What is the patient wearing?: Pants, Non-skid slipper socks     Pants- Performed by patient: Thread/unthread right pants leg, Thread/unthread left pants leg, Pull pants up/down Pants- Performed  by helper: Thread/unthread right pants leg, Thread/unthread left pants leg, Pull pants up/down   Non-skid slipper socks- Performed by helper: Don/doff right sock, Don/doff left sock                  Lower body assist Assist for lower body dressing: Touching or steadying assistance (Pt > 75%)      Toileting Toileting Toileting activity did not occur: Refused Toileting steps completed by patient:  Performs perineal hygiene Toileting steps completed by helper: Adjust clothing prior to toileting, Performs perineal hygiene, Adjust clothing after toileting    Toileting assist Assist level:  (max A)   Transfers Chair/bed transfer   Chair/bed transfer method: Squat pivot Chair/bed transfer assist level: Touching or steadying assistance (Pt > 75%) Chair/bed transfer assistive device: Mechanical lift Mechanical lift: Stedy   Locomotion Ambulation Ambulation activity did not occur: Safety/medical concerns   Max distance: 10' Assist level: Touching or steadying assistance (Pt > 75%)   Wheelchair          Cognition Comprehension Comprehension assist level: Understands basic 50 - 74% of the time/ requires cueing 25 - 49% of the time  Expression Expression assist level: Expresses basic 50 - 74% of the time/requires cueing 25 - 49% of the time. Needs to repeat parts of sentences.  Social Interaction Social Interaction assist level: Interacts appropriately 25 - 49% of time - Needs frequent redirection.  Problem Solving Problem solving assist level: Solves basic 25 - 49% of the time - needs direction more than half the time to initiate, plan or complete simple activities  Memory Memory assist level: Recognizes or recalls 25 - 49% of the time/requires cueing 50 - 75% of the time    Medical Problem List and Plan:  1. 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 DVT Prophylaxis/Anticoagulation: Pharmaceutical: Lovenox PLT 414K on 9/12 3. Headaches/Pain Management:   Topamax started 9/1--pt with persistent headaches   -increase to 50mg  twice a day, also on Diamox, Will follow-up with Dr. Jaynee Eagles 4. Mood: LCSW to follow for evaluation and support.  5. Neuropsych: This patient is capable of making decisions on her own  behalf with assistance.  6. Skin/Wound Care: Routine pressure relief measures.  7. Fluids/Electrolytes/Nutrition: Monitor I/Os Per dietary, via PEG tube, tolerating TF, gets nauseated with med administration 8. MSSA PNA: Completed treatment 7 day course of cefazolin on 8/30. Continue to monitor with strict aspiration precautions--HOB > 30 degrees at all times.  9. VDRF: s/p trach, #4 being capped , hope to decannulate next week,     10 HTN: Monitor BP bid--on lopressor bid and catapres Vitals:   07/02/17 0406 07/02/17 0800  BP:  (!) 123/59  Pulse: 77 78  Resp: 18 20  Temp:    SpO2: 99% 100%    bp controlled . 07/02/2017 11. ABLA: Improved with transfusion.   Hemoglobin 9.6 on 9/13 Continue to monitor 12. Anxiety disorder:Team to provide ego support. Daughter staying to encourage patient. Palliative following for support and placed on IV ativan tid for symptoms management--decrease to prn due to sedation.  13. T2DM: Hgb A1C-  - CBG (last 3)   Recent Labs  07/02/17 0001 07/02/17 0321 07/02/17 0802  GLUCAP 218* 215* 104*  Fair control   -increase lantus to 40u daily . a.m. blood sugars greater than 200, on TF  after PEG  -continue scheduled novolog 14. 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 Leucocytosis:resolved .Monitor for signs of infection.   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 Per neurology when she has an outpatient appointment with Dr. Jaynee Eagles 18. B-MCA stenosis/L-ICA stenosis s/p revascularization: On ASA/Plavix.     LOS (Days) 14 A FACE TO FACE EVALUATION WAS PERFORMED  Charlett Blake 07/02/2017 8:50 AM

## 2017-07-02 NOTE — Progress Notes (Signed)
Nutrition Follow-up  DOCUMENTATION CODES:   Not applicable  INTERVENTION:  Nocturnal tube feeds of Vital 1.5 formula via PEG at goal rate of 60 ml/hr x 10 hours (7pm-5am) to provide 900 kcal (53% of kcal needs), 41 grams of protein (48% of protein needs), and 456 ml of water.  Continue free water flushes 100 ml QID per tube.  72 hour calorie count has been initiated. RD to follow up Monday, 9/17, for full calorie count results.  Provide Glucerna Shake po TID, each supplement provides 220 kcal and 10 grams of protein.  NUTRITION DIAGNOSIS:   Inadequate oral intake related to inability to eat, dysphagia (Stroke) as evidenced by NPO status; diet just advanced  GOAL:   Patient will meet greater than or equal to 90% of their needs; progressing  MONITOR:   PO intake, Supplement acceptance, Labs, Weight trends, Skin, I & O's, TF tolerance  REASON FOR ASSESSMENT:   Consult Enteral/tube feeding initiation and management  ASSESSMENT:   66yo female with history of DM, HTN, Peripheral neuropathy, HLD, pseudotumor cerebri, admitted on 8/13 with acute R hemispheric stroke s/p thrombectomy 8/16, trached 8/23 MBS done 8/30 showed dysphagia and silent aspiration, continues to be NPO. Presents to rehab with R hemiparesis, functional and cognitive deficits. Trach capped.  MBS done this AM. Diet has been advanced to a dysphagia 2 diet with thin liquids. Meal completion 20%. Tube feeding orders have been changed to nocturnal feeds to encourage PO intake during the day per [name], PA. RD to continue with current orders. Calorie count has also been initiated by PA. RD to follow up Monday with full calorie count results. RD to order Glucerna Shake to aid in caloric and protein needs.   Labs and medications reviewed.   Diet Order:  DIET DYS 2 Room service appropriate? Yes; Fluid consistency: Thin  Skin:   (Incision R groin)  Last BM:  9/12  Height:   Ht Readings from Last 1 Encounters:   06/18/17 5\' 4"  (1.626 m)    Weight:   Wt Readings from Last 1 Encounters:  07/02/17 172 lb 2.9 oz (78.1 kg)    Ideal Body Weight:  54.5 kg  BMI:  Body mass index is 29.55 kg/m.  Estimated Nutritional Needs:   Kcal:  1700-1900  Protein:  85-100 grams  Fluid:  1.7 - 1.9 L/day  EDUCATION NEEDS:   No education needs identified at this time  Corrin Parker, MS, RD, LDN Pager # [phone] After hours/ weekend pager # [phone]

## 2017-07-03 ENCOUNTER — Inpatient Hospital Stay (HOSPITAL_COMMUNITY): Payer: Medicare Other | Admitting: Physical Therapy

## 2017-07-03 LAB — GLUCOSE, CAPILLARY
GLUCOSE-CAPILLARY: 133 mg/dL — AB (ref 65–99)
Glucose-Capillary: 242 mg/dL — ABNORMAL HIGH (ref 65–99)
Glucose-Capillary: 55 mg/dL — ABNORMAL LOW (ref 65–99)
Glucose-Capillary: 133 mg/dL — ABNORMAL HIGH (ref 65–99)

## 2017-07-03 MED ORDER — INSULIN GLARGINE 100 UNIT/ML ~~LOC~~ SOLN
20.0000 [IU] | Freq: Every day | SUBCUTANEOUS | Status: DC
  Administered 2017-07-04 – 2017-07-08 (×5): 20 [IU] via SUBCUTANEOUS
  Filled 2017-07-03 (×5): qty 0.2

## 2017-07-03 MED ORDER — FREE WATER
200.0000 mL | Freq: Three times a day (TID) | Status: DC
  Administered 2017-07-03 – 2017-07-06 (×9): 200 mL

## 2017-07-03 NOTE — Progress Notes (Signed)
[place] PHYSICAL MEDICINE & REHABILITATION     PROGRESS NOTE  Subjective/Complaints:  Reviewed modified barium swallow results, has been tolerating meds by mouth route Patient participating better with therapy ROS: Denies  vomiting this morning, no abdominal pain, no breathing problems.  Objective:  Vital Signs: Blood pressure (!) 127/56, pulse 72, temperature 98 F (36.7 C), temperature source Oral, resp. rate 18, height 5\' 4"  (1.626 m), weight 78.3 kg (172 lb 9.9 oz), SpO2 98 %. Dg Swallowing Func-speech Pathology  Result Date: 07/02/2017 Objective Swallowing Evaluation: Type of Study: MBS-Modified Barium Swallow Study Patient Details Name: [name] MRN: [id number] Date of Birth: Jul 14, 1951 Today's Date: 07/02/2017 Time: SLP Start Time (ACUTE ONLY): 1050-SLP Stop Time (ACUTE ONLY): 1106 SLP Time Calculation (min) (ACUTE ONLY): 16 min Past Medical History: Past Medical History: Diagnosis Date . Anemia   takes iron supplement . Anesthesia complication   disorientation due to pseudotumor . Diabetes mellitus   IDDM . Family history of anesthesia complication 37 yrs ago  brother stopped breathing for a minute or two . GERD (gastroesophageal reflux disease)  . Headache(784.0)   due to pseudotumor; daily headache . Hypercholesteremia   unable to tolerate statins . Hypertension   under control with meds., has been on med. x 5 yr. . Peripheral neuropathy  . PONV (postoperative nausea and vomiting)  . 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 Synovitis of ankle 04/2013  left . Wears dentures   full Past Surgical History: Past Surgical History: Procedure Laterality Date . ANKLE ARTHROSCOPY Left 05/18/2013  Procedure: LEFT ANKLE ARTHROSCOPY WITH DEBRIDEMENT,  SUBTALAR OPEN DEBRIDEMENT ;  Surgeon: Wylene Simmer, MD;  Location: [place];  Service: Orthopedics;  Laterality: Left; . APPENDECTOMY    as a child . BALLOON  DILATION N/A 07/04/2013  Procedure: BALLOON DILATION;  Surgeon: Garlan Fair, MD;  Location: [facility];  Service: Endoscopy;  Laterality: N/A; . BLADDER SUSPENSION   . BREAST LUMPECTOMY W/ NEEDLE LOCALIZATION Left 05/22/2011 . CAROTID ANGIOGRAPHY Bilateral 06/03/2017  Procedure: Bilateral Carotid Angiography;  Surgeon: Conrad El Reno, MD;  Location: [place] [facility];  Service: Cardiovascular;  Laterality: Bilateral; . COLONOSCOPY WITH PROPOFOL N/A 05/14/2014  Procedure: COLONOSCOPY WITH PROPOFOL;  Surgeon: Garlan Fair, MD;  Location: [facility];  Service: Endoscopy;  Laterality: N/A; . ESOPHAGEAL DILATION  06/23/2006; 08/05/2004 . ESOPHAGOGASTRODUODENOSCOPY N/A 07/04/2013  Procedure: ESOPHAGOGASTRODUODENOSCOPY (EGD);  Surgeon: Garlan Fair, MD;  Location: [facility];  Service: Endoscopy;  Laterality: N/A; . ESOPHAGOGASTRODUODENOSCOPY (EGD) WITH PROPOFOL N/A 05/14/2014  Procedure: ESOPHAGOGASTRODUODENOSCOPY (EGD) WITH PROPOFOL;  Surgeon: Garlan Fair, MD;  Location: [facility];  Service: Endoscopy;  Laterality: N/A; . IR ANGIO VERTEBRAL SEL SUBCLAVIAN INNOMINATE UNI R MOD SED  06/03/2017 . IR GASTROSTOMY TUBE MOD SED  06/25/2017 . IR PERCUTANEOUS ART THROMBECTOMY/INFUSION INTRACRANIAL INC DIAG ANGIO  06/03/2017 . IR PERCUTANEOUS ART THROMBECTOMY/INFUSION INTRACRANIAL INC DIAG ANGIO  06/03/2017 . LUMBAR PERITONEAL SHUNT    x 2 . OVARIAN CYST REMOVAL    age 32 . RADIOLOGY WITH ANESTHESIA N/A 06/03/2017  Procedure: RADIOLOGY WITH ANESTHESIA;  Surgeon: Luanne Bras, MD;  Location: [place];  Service: Radiology;  Laterality: N/A; . SHUNT REVISION  2007 . TOTAL ABDOMINAL HYSTERECTOMY W/ BILATERAL SALPINGOOPHORECTOMY  1994  age 66s . WRIST SURGERY Left 2014   [name] HPI: 66yo female with hx HTN, DM who presented initially 8/13 to her PCP with headache, R sided  weakness and vision changes. She was sent for MRI which revealed multiple areas of acute L MCA infarct. She was sent to ER and admitted for CVA  workup. On 8/16 pt was in cath lab for diagnostic carotid and cerebral angiogram with vascular surgery when she became suddenly unresponsive with L sided weakness and possible L sided seizure-like activity. CTA revealed bilateral M1 occlusions. She was intubated and taken urgently to IR for revascularization and rapid resolution of embolism. ETT 8/16-8/20, 8/20-8/23, trach 8/23, pt tolerated trach collar overnight. Subjective: pt alert, worried about feeling nauseous from barium Assessment / Plan / Recommendation CHL IP CLINICAL IMPRESSIONS 07/02/2017 Clinical Impression Pt with increased overall oropharyngeal abilities over previous swallow study. Pt continues to have #6 cuffless trach which has been capped for > 2 days. Pt on room air. Pt currently presents with mild to moderate oral phase dysphagia and mild pharyngeal phase dysphagia. Pt's oral phase is c/b decreased oral manipulation of soft solid texutres and whole pill presented with applesauce. Pt spit out the pill and one piece of diced peach d/t decreased coordination and she also spit out 1 piece of diced peach. Pt's pharyngeal phase presents with sensory deficits that led to delayed swallow initiaiton to pyriform sinuses with thin liquids via spoon and cup. Pt with flash penetration which cleared during the swallow when thin liquids presented via cup. Pt with frank penetration x 1 with large cup sips. Pt sensed and able to clear with weak throat clear. Recommend upgrade to dysphagia 2 with thin liquids via cup, medicine crushed in applesauce or puree, full nursing supervision d/t cognitive deficits. Lurline Idol must be capped.  SLP Visit Diagnosis Dysphagia, oropharyngeal phase (R13.12) Attention and concentration deficit following -- Frontal lobe and executive function deficit following -- Impact on safety and function Mild aspiration risk   CHL IP TREATMENT RECOMMENDATION 07/02/2017 Treatment Recommendations Therapy as outlined in treatment plan below    Prognosis 06/17/2017 Prognosis for Safe Diet Advancement Good Barriers to Reach Goals -- Barriers/Prognosis Comment -- CHL IP DIET RECOMMENDATION 07/02/2017 SLP Diet Recommendations Dysphagia 2 (Fine chop) solids;Thin liquid Liquid Administration via Cup Medication Administration Crushed with puree Compensations Minimize environmental distractions;Slow rate;Small sips/bites Postural Changes Seated upright at 90 degrees   CHL IP OTHER RECOMMENDATIONS 07/02/2017 Recommended Consults -- Oral Care Recommendations Oral care BID Other Recommendations --   CHL IP FOLLOW UP RECOMMENDATIONS 06/17/2017 Follow up Recommendations Inpatient Rehab   CHL IP FREQUENCY AND DURATION 06/17/2017 Speech Therapy Frequency (ACUTE ONLY) min 3x week Treatment Duration 2 weeks      CHL IP ORAL PHASE 07/02/2017 Oral Phase Impaired Oral - Pudding Teaspoon -- Oral - Pudding Cup -- Oral - Honey Teaspoon -- Oral - Honey Cup -- Oral - Nectar Teaspoon -- Oral - Nectar Cup -- Oral - Nectar Straw -- Oral - Thin Teaspoon WFL Oral - Thin Cup WFL Oral - Thin Straw -- Oral - Puree WFL Oral - Mech Soft Piecemeal swallowing;Weak lingual manipulation;Decreased bolus cohesion Oral - Regular -- Oral - Multi-Consistency Piecemeal swallowing;Weak lingual manipulation;Decreased bolus cohesion;Reduced posterior propulsion Oral - Pill Weak lingual manipulation;Reduced posterior propulsion;Decreased bolus cohesion;Piecemeal swallowing Oral Phase - Comment --  CHL IP PHARYNGEAL PHASE 07/02/2017 Pharyngeal Phase Impaired Pharyngeal- Pudding Teaspoon -- Pharyngeal -- Pharyngeal- Pudding Cup -- Pharyngeal -- Pharyngeal- Honey Teaspoon NT Pharyngeal -- Pharyngeal- Honey Cup -- Pharyngeal -- Pharyngeal- Nectar Teaspoon NT Pharyngeal -- Pharyngeal- Nectar Cup -- Pharyngeal -- Pharyngeal- Nectar Straw -- Pharyngeal -- Pharyngeal- Thin Teaspoon Delayed swallow initiation-pyriform sinuses  Pharyngeal -- Pharyngeal- Thin Cup Delayed swallow initiation-pyriform  sinuses;Penetration/Aspiration before swallow Pharyngeal Material enters airway, remains ABOVE vocal cords then ejected out Pharyngeal- Thin Straw -- Pharyngeal -- Pharyngeal- Puree Delayed swallow initiation-pyriform sinuses Pharyngeal -- Pharyngeal- Mechanical Soft Delayed swallow initiation-pyriform sinuses Pharyngeal -- Pharyngeal- Regular -- Pharyngeal -- Pharyngeal- Multi-consistency Delayed swallow initiation-pyriform sinuses Pharyngeal -- Pharyngeal- Pill Delayed swallow initiation-pyriform sinuses Pharyngeal -- Pharyngeal Comment --  CHL IP CERVICAL ESOPHAGEAL PHASE 07/02/2017 Cervical Esophageal Phase WFL Pudding Teaspoon -- Pudding Cup -- Honey Teaspoon -- Honey Cup -- Nectar Teaspoon -- Nectar Cup -- Nectar Straw -- Thin Teaspoon -- Thin Cup -- Thin Straw -- Puree -- Mechanical Soft -- Regular -- Multi-consistency -- Pill -- Cervical Esophageal Comment -- No flowsheet data found. Happi Overton 07/02/2017, 10:28 AM              No results for input(s): WBC, HGB, HCT, PLT in the last 72 hours.  Recent Labs  07/01/17 0440  NA 133*  K 3.3*  CL 101  GLUCOSE 227*  BUN 24*  CREATININE 0.88  CALCIUM 8.6*   CBG (last 3)   Recent Labs  07/02/17 2048 07/03/17 0647 07/03/17 1154  GLUCAP 140* 242* 55*    Wt Readings from Last 3 Encounters:  07/03/17 78.3 kg (172 lb 9.9 oz)  06/18/17 76.4 kg (168 lb 6.9 oz)  01/15/16 79.3 kg (174 lb 12.8 oz)    Physical Exam:  BP (!) 127/56   Pulse 72   Temp 98 F (36.7 C) (Oral)   Resp 18   Ht 5\' 4"  (1.626 m)   Wt 78.3 kg (172 lb 9.9 oz)   SpO2 98%   BMI 29.63 kg/m  Constitutional: She appears well-developed and well-nourished.  HENT: Normocephalic and atraumatic.  Eyes: EOM are normal. No discharge Neck: CFS #6 without drainage today Cardiovascular: RRR. No JVD Respiratory: normal effort. Fair flow, rhonchi heard GI: distended , reduced BS, NT  +tympany Musculoskeletal: She exhibits no edema or tenderness.  Neurological: Alert  Left  facial droop with dysarthric speech.  Right sided weakness with decrease in motor control.  Right homonymous hemianopsia RUE grossly 3- to 3/5.Marland Kitchen  RLE grossly 4-/5.  Skin: Skin is warm and dry. She is not diaphoretic.  Psychiatric: affect flat, processing delayed Assessment/Plan: 1. 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 Physiatrist is providing close team supervision and 24 hour management of active medical problems listed below. Physiatrist and rehab team continue to assess barriers to discharge/monitor patient progress toward functional and medical goals.  Function:  Bathing Bathing position   Position: Shower  Bathing parts Body parts bathed by patient: Chest, Abdomen, Right arm, Left arm, Front perineal area, Right upper leg, Left upper leg, Buttocks Body parts bathed by helper: Buttocks, Back, Right lower leg, Left lower leg  Bathing assist Assist Level: Touching or steadying assistance(Pt > 75%)      Upper Body Dressing/Undressing Upper body dressing   What is the patient wearing?: Pull over shirt/dress     Pull over shirt/dress - Perfomed by patient: Thread/unthread left sleeve, Put head through opening, Pull shirt over trunk Pull over shirt/dress - Perfomed by helper: Thread/unthread right sleeve        Upper body assist Assist Level: Touching or steadying assistance(Pt > 75%)   Set up : To obtain clothing/put away  Lower Body Dressing/Undressing Lower body  dressing   What is the patient wearing?: Pants, Non-skid slipper socks     Pants- Performed by patient: Thread/unthread right pants leg, Thread/unthread left pants leg, Pull pants up/down Pants- Performed by helper: Thread/unthread right pants leg, Thread/unthread left pants leg, Pull pants up/down   Non-skid slipper socks- Performed by helper: Don/doff  right sock, Don/doff left sock                  Lower body assist Assist for lower body dressing: Touching or steadying assistance (Pt > 75%)      Toileting Toileting Toileting activity did not occur: Refused Toileting steps completed by patient: Performs perineal hygiene Toileting steps completed by helper: Adjust clothing prior to toileting, Performs perineal hygiene, Adjust clothing after toileting    Toileting assist Assist level:  (max A)   Transfers Chair/bed transfer   Chair/bed transfer method: Stand pivot Chair/bed transfer assist level: Touching or steadying assistance (Pt > 75%) Chair/bed transfer assistive device: Mechanical lift Mechanical lift: Stedy   Locomotion Ambulation Ambulation activity did not occur: Safety/medical concerns   Max distance: 1ft  Assist level: Touching or steadying assistance (Pt > 75%)   Wheelchair   Type: Manual Max wheelchair distance: 86ft Assist Level: Touching or steadying assistance (Pt > 75%)  Cognition Comprehension Comprehension assist level: Understands basic 50 - 74% of the time/ requires cueing 25 - 49% of the time, Understands basic 75 - 89% of the time/ requires cueing 10 - 24% of the time  Expression Expression assist level: Expresses basic 50 - 74% of the time/requires cueing 25 - 49% of the time. Needs to repeat parts of sentences.  Social Interaction Social Interaction assist level: Interacts appropriately 25 - 49% of time - Needs frequent redirection.  Problem Solving Problem solving assist level: Solves basic 25 - 49% of the time - needs direction more than half the time to initiate, plan or complete simple activities  Memory Memory assist level: Recognizes or recalls 50 - 74% of the time/requires cueing 25 - 49% of the time    Medical Problem List and Plan:  1. 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 DVT Prophylaxis/Anticoagulation: Pharmaceutical: Lovenox PLT 414K on 9/12 3. Headaches/Pain Management:   Topamax started 9/1--pt with persistent headaches   -increase to 50mg  twice a day, also on Diamox, Will follow-up with Dr. Jaynee Eagles 4. Mood: LCSW to follow for evaluation and support.  5. Neuropsych: This patient is capable of making decisions on her own behalf with assistance.  6. Skin/Wound Care: Routine pressure relief measures.  7. Fluids/Electrolytes/Nutrition: Monitor I/Os Off tube feeds monitor meal intake as well as fluid intake, took 50% of lunch today. Fluid intake less than 200 mL today will increase flushes 8. MSSA PNA: Completed treatment 7 day course of cefazolin on 8/30. Continue to monitor with strict aspiration precautions--HOB > 30 degrees at all times.  9. VDRF: s/p trach, #4 being capped , hope to decannulate next week,     10 HTN: Monitor BP bid--on lopressor bid and catapres Vitals:   07/03/17 0831 07/03/17 1130  BP:    Pulse: 75 72  Resp: 18 18  Temp:    SpO2: 98% 98%    bp controlled . 07/02/2017 11. ABLA: Improved with transfusion.   Hemoglobin 9.6 on 9/13 Continue to monitor 12. Anxiety disorder:Team  to provide ego support. Daughter staying to encourage patient. Palliative following for support and placed on IV ativan tid for symptoms management--decrease to prn due to sedation.  13. T2DM: Hgb A1C-  - CBG (last 3)   Recent Labs  07/02/17 2048 07/03/17 0647 07/03/17 1154  GLUCAP 140* 242* 25*  Fair control   -Reduce lantus to 20u daily . Off TF   -continue scheduled novolog 14. 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 Leucocytosis:resolved .Monitor for signs of infection.   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 Per neurology when she  has an outpatient appointment with Dr. Jaynee Eagles 18. B-MCA stenosis/L-ICA stenosis s/p revascularization: On ASA/Plavix.     LOS (Days) 15 A FACE TO FACE EVALUATION WAS PERFORMED  Charlett Blake 07/03/2017 2:19 PM

## 2017-07-04 ENCOUNTER — Inpatient Hospital Stay (HOSPITAL_COMMUNITY): Payer: Medicare Other

## 2017-07-04 LAB — GLUCOSE, CAPILLARY
GLUCOSE-CAPILLARY: 83 mg/dL (ref 65–99)
GLUCOSE-CAPILLARY: 155 mg/dL — AB (ref 65–99)
Glucose-Capillary: 91 mg/dL (ref 65–99)
Glucose-Capillary: 118 mg/dL — ABNORMAL HIGH (ref 65–99)

## 2017-07-04 MED ORDER — CLONIDINE HCL 0.2 MG/24HR TD PTWK
0.2000 mg | MEDICATED_PATCH | TRANSDERMAL | Status: DC
  Administered 2017-07-07: 0.2 mg via TRANSDERMAL
  Filled 2017-07-04: qty 1

## 2017-07-04 NOTE — Progress Notes (Signed)
Occupational Therapy Session Note  Patient Details  Name: [name] MRN: [id number] Date of Birth: [birth year]  Today's Date: 07/04/2017 OT Individual Time: 2633-3545 OT Individual Time Calculation (min): 53 min    Short Term Goals: Week 3:  OT Short Term Goal 1 (Week 3): STG=LTG due to LOS  Skilled Therapeutic Interventions/Progress Updates:    Pt resting in bed upon arrival.  Pt agreeable to participating in therapy but deferred bathing/dressing to later with assistance from daughter.  Pt performed squat pivot transfer to w/c with min A.  Pt transitioned to gym and used RW to perform SPT to mat (min A). Pt engaged in table tasks with focus on LUE/hand [facility] tasks.  Pt with limited functional use of L thumb and demonstrated difficulty with using L thumb to assist with removing/replacing caps on medicine bottles.  Pt stated her vision was getting "blurry" and she became anxious.  BP as noted below.  Pt required mod A for squat pivot back to w/c and returned to room.  Pt returned to bed and RN notified.  Pt remained in bed with daughter present.   Therapy Documentation Precautions:  Precautions Precautions: Fall Precaution Comments: cortrak, trach Restrictions Weight Bearing Restrictions: No Vital Signs: Therapy Vitals Pulse Rate: 98 BP: 97/71 Patient Position (if appropriate): Sitting Oxygen Therapy O2 Device: Not Delivered Pain:  Pt c/o generalized discomfort; RN aware and repsoitioned  See Function Navigator for Current Functional Status.   Therapy/Group: Individual Therapy  Leroy Libman 07/04/2017, 8:44 AM

## 2017-07-04 NOTE — Progress Notes (Signed)
[place] PHYSICAL MEDICINE & REHABILITATION     PROGRESS NOTE  Subjective/Complaints:   has been tolerating meds by mouth route, eating 25-50% of meals. History of delayed gastric emptying and take small meals at home with snacking in between. We discussed similar strategy.  ROS: Denies  vomiting this morning, no abdominal pain, no breathing problems.  Objective:  Vital Signs: Blood pressure 97/71, pulse 92, temperature 97.7 F (36.5 C), temperature source Oral, resp. rate 18, height 5\' 4"  (1.626 m), weight 78.4 kg (172 lb 13.9 oz), SpO2 99 %. No results found. No results for input(s): WBC, HGB, HCT, PLT in the last 72 hours. No results for input(s): NA, K, CL, GLUCOSE, BUN, CREATININE, CALCIUM in the last 72 hours.  Invalid input(s): CO CBG (last 3)   Recent Labs  07/03/17 2105 07/04/17 0621 07/04/17 1144  GLUCAP 133* 83 155*    Wt Readings from Last 3 Encounters:  07/04/17 78.4 kg (172 lb 13.9 oz)  06/18/17 76.4 kg (168 lb 6.9 oz)  01/15/16 79.3 kg (174 lb 12.8 oz)    Physical Exam:  BP 97/71 (BP Location: Left Arm)   Pulse 92   Temp 97.7 F (36.5 C) (Oral)   Resp 18   Ht 5\' 4"  (1.626 m)   Wt 78.4 kg (172 lb 13.9 oz)   SpO2 99%   BMI 29.67 kg/m  Constitutional: She appears well-developed and well-nourished.  HENT: Normocephalic and atraumatic.  Eyes: EOM are normal. No discharge Neck: CFS #6 without drainage today Cardiovascular: RRR. No JVD Respiratory: normal effort. Fair flow, rhonchi heard GI: distended , reduced BS, NT  +tympany Musculoskeletal: She exhibits no edema or tenderness.  Neurological: Alert  Left facial droop with dysarthric speech.  Right sided weakness with decrease in motor control.  Right homonymous hemianopsia RUE grossly 3- to 3/5.Marland Kitchen  RLE grossly 4-/5.  Skin: Skin is warm and dry. She is not diaphoretic.  Psychiatric: affect flat, processing delayed Assessment/Plan: 1. 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 Physiatrist is providing close team supervision and 24 hour management of active medical problems listed below. Physiatrist and rehab team continue to assess barriers to discharge/monitor patient progress toward functional and medical goals.  Function:  Bathing Bathing position   Position: Shower  Bathing parts Body parts bathed by patient: Chest, Abdomen, Right arm, Left arm, Front perineal area, Right upper leg, Left upper leg, Buttocks Body parts bathed by helper: Buttocks, Back, Right lower leg, Left lower leg  Bathing assist Assist Level: Touching or steadying assistance(Pt > 75%)      Upper Body Dressing/Undressing Upper body dressing   What is the patient wearing?: Pull over shirt/dress     Pull over shirt/dress - Perfomed by patient: Thread/unthread left sleeve, Put head through opening, Pull shirt over trunk Pull over shirt/dress - Perfomed by helper: Thread/unthread right sleeve        Upper body assist Assist Level: Touching or steadying assistance(Pt > 75%)   Set up : To obtain clothing/put away  Lower Body Dressing/Undressing Lower body dressing   What is the patient wearing?: Pants, Non-skid slipper socks     Pants- Performed by patient: Thread/unthread right pants leg, Thread/unthread left pants leg, Pull pants up/down Pants- Performed by helper: Thread/unthread right pants leg, Thread/unthread left pants leg, Pull pants up/down   Non-skid slipper socks- Performed by helper: Don/doff right  sock, Don/doff left sock                  Lower body assist Assist for lower body dressing: Touching or steadying assistance (Pt > 75%)      Toileting Toileting Toileting activity did not occur: Refused Toileting steps completed by patient: Performs perineal hygiene Toileting steps completed by helper: Adjust clothing prior  to toileting, Performs perineal hygiene, Adjust clothing after toileting    Toileting assist Assist level:  (max A)   Transfers Chair/bed transfer   Chair/bed transfer method: Stand pivot Chair/bed transfer assist level: Touching or steadying assistance (Pt > 75%) Chair/bed transfer assistive device: Mechanical lift Mechanical lift: Stedy   Locomotion Ambulation Ambulation activity did not occur: Safety/medical concerns   Max distance: 63ft  Assist level: Touching or steadying assistance (Pt > 75%)   Wheelchair   Type: Manual Max wheelchair distance: 39ft Assist Level: Touching or steadying assistance (Pt > 75%)  Cognition Comprehension Comprehension assist level: Understands basic 50 - 74% of the time/ requires cueing 25 - 49% of the time, Understands basic 75 - 89% of the time/ requires cueing 10 - 24% of the time  Expression Expression assist level: Expresses basic 50 - 74% of the time/requires cueing 25 - 49% of the time. Needs to repeat parts of sentences.  Social Interaction Social Interaction assist level: Interacts appropriately 25 - 49% of time - Needs frequent redirection.  Problem Solving Problem solving assist level: Solves basic 25 - 49% of the time - needs direction more than half the time to initiate, plan or complete simple activities  Memory Memory assist level: Recognizes or recalls 50 - 74% of the time/requires cueing 25 - 49% of the time    Medical Problem List and Plan:  1. 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 DVT Prophylaxis/Anticoagulation: Pharmaceutical: Lovenox PLT 414K on 9/12 3. Headaches/Pain Management:   Topamax started 9/1--pt with persistent headaches   -increase to 50mg  twice a day, also on Diamox, Will follow-up with Dr. Jaynee Eagles 4. Mood: LCSW to follow for evaluation  and support.  5. Neuropsych: This patient is capable of making decisions on her own behalf with assistance.  6. Skin/Wound Care: Routine pressure relief measures.  7. Fluids/Electrolytes/Nutrition: Monitor I/Os Off tube feeds monitor meal intake as well as fluid intake, took 50% of lunch today. Fluid intake less than 200 mL today will increase flushes 8. MSSA PNA: Completed treatment 7 day course of cefazolin on 8/30. Continue to monitor with strict aspiration precautions--HOB > 30 degrees at all times.  9. VDRF: s/p trach, #4 being capped , hope to decannulate 07/05/2017     10 HTN: Monitor BP bid--on lopressor bid and catapres Vitals:   07/04/17 0835 07/04/17 1146  BP: 97/71   Pulse: 98 92  Resp:  18  Temp:    SpO2:  99%    bp controlled . 07/04/2017, blood pressure on the low side, may be secondary to reduced fluid intake, will reduce Catapres patch dose to 0.2mg  start today 11. ABLA: Improved with transfusion.   Hemoglobin 9.6 on 9/13 Continue to monitor 12. Anxiety disorder:Team to provide ego support. Daughter staying to encourage patient. Palliative following for support and placed on IV ativan tid for symptoms management--decrease to prn due to sedation.  13. T2DM: Hgb A1C-  - CBG (  last 3)   Recent Labs  07/03/17 2105 07/04/17 0621 07/04/17 1144  GLUCAP 133* 24 155*  Fair control   -Reduce lantus to 20u daily . Off TF , May need to make further adjustments depending on intake  -continue scheduled novolog 14. 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 Leucocytosis:resolved .Monitor for signs of infection.   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 Per neurology when she has an outpatient appointment with Dr. Jaynee Eagles 18. B-MCA stenosis/L-ICA stenosis s/p revascularization: On ASA/Plavix.     LOS (Days) [name]  [name] 07/04/2017 12:54 PM

## 2017-07-04 NOTE — Progress Notes (Signed)
Held lopressor r/t BP. Educated the daughter on to crush meds and put in puree to administer. She has mastered the task. Tech taught daughter how to place bedpan properly and remove it properly. Tech stated daughter is able to complete task. Staff is continuing to educate daughter to prepare for successful transition to home.

## 2017-07-04 NOTE — Progress Notes (Signed)
Physical Therapy Session Note  Patient Details  Name: [name] MRN: [id number] Date of Birth: 07-May-1951  Today's Date: 07/03/2017 PT Individual Time:1435-1515   7mn   Short Term Goals: Week 2:  PT Short Term Goal 1 (Week 2): pt will perform functional transfers with max A PT Short Term Goal 2 (Week 2): pt will tolerate out of bed in w/c x 2 hours per day not in therapy time  Skilled Therapeutic Interventions/Progress Updates:   Pt received supine in bed and agreeable to PT. Supine>sit transfer with supervision assist and min cues for use of bed features.   [facility] assist from PT for improved anterior weight shifting. WC mobility instructed by PT x 764fwith min cues for equalized force through BUE.   PT instructed pt in gait training x [id number] [id number] RW and min assist from PT. Pt noted to have improved step length, and posture on this day. Sp O2 remains greater than 95% with gait training.   Modified [place] level A exercises instructed by PT.  Sit<>stand x 5 with BUE support  LAQ x 10 BLE minisquats x 10 with BUE support  Moderate cues from PT for safety and neuromotor control including decreased speed of eccentric movement and improved use of BLE compared to BUE.   Patient returned too room and [facility] in reach and all needs met.        Therapy Documentation Precautions:  Precautions Precautions: Fall Precaution Comments: cortrak, trach Restrictions Weight Bearing Restrictions: No General:   Vital Signs: Therapy Vitals Temp: 97.7 F (36.5 C) Temp Source: Oral Pulse Rate: 75 Resp: 20 BP: (!) 147/88 Patient Position (if appropriate): Lying Oxygen Therapy SpO2: 100 % O2 Device: Not Delivered Pain:   Mobility:   Locomotion :    Trunk/Postural Assessment :    Balance:   Exercises:   Other Treatments:     See Function Navigator for Current Functional Status.   Therapy/Group:  Individual Therapy  AustLorie Phenix6/2018, 5:41 AM

## 2017-07-05 ENCOUNTER — Inpatient Hospital Stay (HOSPITAL_COMMUNITY): Payer: Medicare Other

## 2017-07-05 ENCOUNTER — Inpatient Hospital Stay (HOSPITAL_COMMUNITY): Payer: Medicare Other | Admitting: Occupational Therapy

## 2017-07-05 ENCOUNTER — Inpatient Hospital Stay (HOSPITAL_COMMUNITY): Payer: Medicare Other | Admitting: Speech Pathology

## 2017-07-05 LAB — GLUCOSE, CAPILLARY
GLUCOSE-CAPILLARY: 159 mg/dL — AB (ref 65–99)
Glucose-Capillary: 58 mg/dL — ABNORMAL LOW (ref 65–99)
Glucose-Capillary: 111 mg/dL — ABNORMAL HIGH (ref 65–99)
Glucose-Capillary: 172 mg/dL — ABNORMAL HIGH (ref 65–99)
Glucose-Capillary: 101 mg/dL — ABNORMAL HIGH (ref 65–99)

## 2017-07-05 MED ORDER — PRO-STAT SUGAR FREE PO LIQD
30.0000 mL | Freq: Two times a day (BID) | ORAL | Status: DC
  Administered 2017-07-05: 30 mL via ORAL
  Filled 2017-07-05 (×4): qty 30

## 2017-07-05 NOTE — Progress Notes (Signed)
PHYSICAL MEDICINE & REHABILITATION     PROGRESS NOTE  Subjective/Complaints: Trach capped x 72 hours, no coughing  Starting to take more po as well  ROS: Denies  vomiting , no abdominal pain, no breathing problems.  Objective:  Vital Signs: Blood pressure (!) 142/68, pulse 88, temperature 99 F (37.2 C), temperature source Oral, resp. rate 20, height 5\' 4"  (1.626 m), weight 78.4 kg (172 lb 14.1 oz), SpO2 99 %. No results found. No results for input(s): WBC, HGB, HCT, PLT in the last 72 hours. No results for input(s): NA, K, CL, GLUCOSE, BUN, CREATININE, CALCIUM in the last 72 hours.  Invalid input(s): CO CBG (last 3)   Recent Labs  07/04/17 2127 07/05/17 0620 07/05/17 0647  GLUCAP 118* 58* 111*    Wt Readings from Last 3 Encounters:  07/05/17 78.4 kg (172 lb 14.1 oz)  06/18/17 76.4 kg (168 lb 6.9 oz)  01/15/16 79.3 kg (174 lb 12.8 oz)    Physical Exam:  BP (!) 142/68 (BP Location: Left Arm)   Pulse 88   Temp 99 F (37.2 C) (Oral)   Resp 20   Ht 5\' 4"  (1.626 m)   Wt 78.4 kg (172 lb 14.1 oz)   SpO2 99%   BMI 29.68 kg/m  Constitutional: She appears well-developed and well-nourished.  HENT: Normocephalic and atraumatic.  Eyes: EOM are normal. No discharge Neck: Shiley #4 without drainage today capped Cardiovascular: RRR. No JVD Respiratory: normal effort. Fair flow, rhonchi heard GI: distended , reduced BS, NT  +tympany Musculoskeletal: She exhibits no edema or tenderness.  Neurological: Alert  Left facial droop with dysarthric speech.  Right sided weakness with decrease in motor control.  Right homonymous hemianopsia RUE grossly 3- to 3/5.Marland Kitchen  RLE grossly 4-/5.  Skin: Skin is warm and dry. She is not diaphoretic.  Psychiatric: affect flat, processing delayed Assessment/Plan: 1. 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 Physiatrist is providing close team supervision and 24 hour management of active medical problems listed below. Physiatrist and rehab team continue to assess barriers to discharge/monitor patient progress toward functional and medical goals.  Function:  Bathing Bathing position   Position: Shower  Bathing parts Body parts bathed by patient: Chest, Abdomen, Right arm, Left arm, Front perineal area, Right upper leg, Left upper leg, Buttocks Body parts bathed by helper: Buttocks, Back, Right lower leg, Left lower leg  Bathing assist Assist Level: Touching or steadying assistance(Pt > 75%)      Upper Body Dressing/Undressing Upper body dressing   What is the patient wearing?: Pull over shirt/dress     Pull over shirt/dress - Perfomed by patient: Thread/unthread left sleeve, Put head through opening, Pull shirt over trunk Pull over shirt/dress - Perfomed by helper: Thread/unthread right sleeve        Upper body assist Assist Level: Touching or steadying assistance(Pt > 75%)   Set up : To obtain clothing/put away  Lower Body Dressing/Undressing Lower body dressing   What is the patient wearing?: Pants, Non-skid slipper socks     Pants- Performed by patient: Thread/unthread right pants leg, Thread/unthread left pants leg, Pull pants up/down Pants- Performed by helper: Thread/unthread right pants leg, Thread/unthread left pants leg, Pull pants up/down   Non-skid slipper socks- Performed by helper: Don/doff right sock, Don/doff left sock  Lower body assist Assist for lower body dressing: Touching or steadying assistance (Pt > 75%)      Toileting Toileting Toileting activity did not occur: Refused Toileting steps completed by patient: Performs perineal hygiene Toileting steps completed by helper: Adjust clothing prior to toileting, Performs perineal hygiene, Adjust clothing after toileting    Toileting assist Assist  level:  (max A)   Transfers Chair/bed transfer   Chair/bed transfer method: Stand pivot Chair/bed transfer assist level: Touching or steadying assistance (Pt > 75%) Chair/bed transfer assistive device: Mechanical lift Mechanical lift: Stedy   Locomotion Ambulation Ambulation activity did not occur: Safety/medical concerns   Max distance: 58ft  Assist level: Touching or steadying assistance (Pt > 75%)   Wheelchair   Type: Manual Max wheelchair distance: 21ft Assist Level: Touching or steadying assistance (Pt > 75%)  Cognition Comprehension Comprehension assist level: Understands basic 50 - 74% of the time/ requires cueing 25 - 49% of the time  Expression Expression assist level: Expresses basic 50 - 74% of the time/requires cueing 25 - 49% of the time. Needs to repeat parts of sentences.  Social Interaction Social Interaction assist level: Interacts appropriately 25 - 49% of time - Needs frequent redirection.  Problem Solving Problem solving assist level: Solves basic 25 - 49% of the time - needs direction more than half the time to initiate, plan or complete simple activities  Memory Memory assist level: Recognizes or recalls 50 - 74% of the time/requires cueing 25 - 49% of the time    Medical Problem List and Plan:  1. 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 DVT Prophylaxis/Anticoagulation: Pharmaceutical: Lovenox PLT 414K on 9/12 3. Headaches/Pain Management:   Topamax started 9/1--pt with persistent headaches   -increase to 50mg  twice a day, also on Diamox, Will follow-up with Dr. Jaynee Eagles 4. Mood: LCSW to follow for evaluation and support.  5. Neuropsych: This patient is capable of making decisions on her own behalf with assistance.  6. Skin/Wound Care: Routine pressure relief measures.  7.  Fluids/Electrolytes/Nutrition: Monitor I/Os Off tube feeds monitor meal intake as well as fluid intake, took 50% of lunch today. Fluid intake less than 200 mL today will increase flushes 8. MSSA PNA: Completed treatment 7 day course of cefazolin on 8/30. Continue to monitor with strict aspiration precautions--HOB > 30 degrees at all times.  9. VDRF: s/p trach, #4 being capped , hope to decannulate 07/05/2017     10 HTN: Monitor BP bid--on lopressor bid and catapres Vitals:   07/04/17 2100 07/05/17 0312  BP: 124/74 (!) 142/68  Pulse: 86 88  Resp:  20  Temp:  99 F (37.2 C)  SpO2:  99%    bp controlled . 07/05/2017, blood pressure on the low side, may be secondary to reduced fluid intake, will reduce Catapres patch dose to 0.2mg  start today 11. ABLA: Improved with transfusion.   Hemoglobin 9.6 on 9/13 Continue to monitor 12. Anxiety disorder:Team to provide ego support. Daughter staying to encourage patient. Palliative following for support and placed on IV ativan tid for symptoms management--decrease to prn due to sedation.  13. T2DM: Hgb A1C-  - CBG (last 3)   Recent Labs  07/04/17 2127 07/05/17 0620 07/05/17 0647  GLUCAP 118* 58* 111*  Fair control   -Reduce lantus to 20u daily . Off TF , May  need to make further adjustments depending on intake  -continue scheduled novolog 14. 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 Leucocytosis:resolved .Monitor for signs of infection.   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 Per neurology when she has an outpatient appointment with Dr. Jaynee Eagles 18. B-MCA stenosis/L-ICA stenosis s/p revascularization: On ASA/Plavix.     LOS (Days) 17 A FACE TO FACE EVALUATION WAS PERFORMED  [name] 07/05/2017 7:26 AM

## 2017-07-05 NOTE — Progress Notes (Signed)
Physical Therapy Session Note  Patient Details  Name: [name] MRN: [id number] Date of Birth: 01-Oct-1951  Today's Date: 07/05/2017 PT Individual Time: 1100-1200 PT Individual Time Calculation (min): 60 min   Short Term Goals: Week 2:  PT Short Term Goal 1 (Week 2): pt will perform functional transfers with max A PT Short Term Goal 2 (Week 2): pt will tolerate out of bed in w/c x 2 hours per day not in therapy time  Skilled Therapeutic Interventions/Progress Updates:    Pt reporting that she was nauseous earlier and feeling very tired initially declining OOB. Daughter present and encouraging pt to attempt therapy session and eventually pt agreeable. Extra time and use of rails with supervision to come to EOB with min assist for stand step transfer with RW with cues for safe RW placement. Pt reports that the trach is supposed to come out today and is worried she is going to do something to mess that up - encouraged pt that is not the case and working with therapy is beneficial. Focused on any functional mobility pt willing to participate in to progress overall endurance, upright and OOB tolerance, functional balance, and neuro re-ed for coordination and balance re-training. Engaged in Nustep x 5 min with 3 rest breaks and max encouragement to continue activity for reciprocal movement pattern retraining and coordination with BUE and BLE on level 4. Dynamic gait through obstacles on carpeted surface to simulate home environment mobility with min assist and max cues for safe use of RW and for problem solving mobility through tight spaces. Pt with 3 x LOB requiring min assist to recover. Blocked practice sit <> stands with focus on safe technique and taking RUE off of RW prior to sitting to decrease fall risk of RW tipping when hand gets "stuck". Educated and pt teach back techniques PT/OT has been using for RUE stretching and tone management. D/c planning discussed during rest breaks and pt becoming  emotional at times - support provided. End of session encouraged to sit up for lunch as pt requesting to return to bed. Encouraged family to have her 46 for meals as well in the hospital and then also upon d/c. Pt's husband verbalized understanding.   Therapy Documentation Precautions:  Precautions Precautions: Fall Precaution Comments: Trach capped Restrictions Weight Bearing Restrictions: No  Pain:  1 episode of cramping in LLE during mobility - sat and rested and pt able to continue. Denies other pain besides fatigue and nausea from earlier.    See Function Navigator for Current Functional Status.   Therapy/Group: Individual Therapy  Canary Brim Ivory Broad, PT, DPT  07/05/2017, 12:17 PM

## 2017-07-05 NOTE — Progress Notes (Signed)
Occupational Therapy Session Note  Patient Details  Name: [name] MRN: [id number] Date of Birth: 07/31/1951  Today's Date: 07/05/2017 OT Individual Time: 1224-8250 OT Individual Time Calculation (min): 60 min    Short Term Goals: Week 3:  OT Short Term Goal 1 (Week 3): STG=LTG due to LOS  Skilled Therapeutic Interventions/Progress Updates:    Pt seen for ADL bathing/dressing session. Pt in supine upon arrival, agreeable to tx session. Pt agreeable to bathing at shower level. She transferred to EOB with supervision using hospital bed functions. She ambulated throughout session with RW and min A, assist required for RW management in functional tasks as pt with little awareness to environmental barriers/ how to navigate around them.  She bathed seated on tub bench, able to reach down to wash B feet with min cuing for problem solving technique and using b UEs at dominant vs non-dominant level.  She returned to w/c to dress. She was able to recall hemi dressing techniques, however required cuing/ encouragement to problem solve donning shirt/ pants and unable to problem solve how to correct basic mistake in dressing. Pt left seated in w/c at end of session with hand off to SLP. Education provided to pt and daughter throughout session regarding benefits of OOB and upright toileting (recommend no use of bedpain any longer), DME, and d/c planning.   Therapy Documentation Precautions:  Precautions Precautions: Fall Precaution Comments: cortrak, trach Restrictions Weight Bearing Restrictions: No Pain:   Complaints of headache upon arrival, RN made aware.   See Function Navigator for Current Functional Status.   Therapy/Group: Individual Therapy  Lewis, Honore Wipperfurth C 07/05/2017, 7:10 AM

## 2017-07-05 NOTE — Progress Notes (Signed)
Hypoglycemic Event  CBG: 58  Treatment: 15 GM carbohydrate snack  Symptoms: None  Follow-up CBG: Time: 6:50a CBG Result: 111  Possible Reasons for Event: Unknown  Comments/MD notified:Yes    Delana Meyer

## 2017-07-05 NOTE — Progress Notes (Signed)
Calorie Count Note  72 hour calorie count ordered.  Diet: Dysphagia 2 diet with thin liquids. Supplements:  Glucerna Shake po TID, each supplement provides 220 kcal and 10 grams of protein.  Day 1: Breakfast: 4 kcal, 1 gram of protein Lunch: 23 kcal, 3 grams of protein Dinner: 185 kcal, 11 grams of protein Supplements: 220 kcal, 10 grams of protein Total intake: 432 kcal (25% of minimum estimated needs)  25 grams of protein (29% of minimum estimated needs)  Day 2: Breakfast: 109 kcal, 7 grams of protein Lunch: 98 kcal, 12 grams of protein Dinner: 77 kcal, 6 grams of protein Supplements: 660 kcal, 30 grams of protein Total intake: 944 kcal (56% of minimum estimated needs)  55 grams of protein (65% of minimum estimated needs)  Day 3: Breakfast: 51 kcal, 3 grams of protein Lunch: no information provided Dinner: no information provided Supplements: 660 kcal, 30 grams of protein Total intake: 711 kcal (42% of minimum estimated needs)  33 grams of protein (39% of minimum estimated needs)  Tube feeds have been discontinued. Pt reports she is improving on her PO intake and makes sure to eat at least 50% at meals. Pt currently has Glucerna ordered and has been consuming them. Family as additionally brought in chocolate flavored Glucerna from the store to add flavor variety. RD to order PRostat to aid in protein needs.  Estimated Nutritional Needs:  Kcal:  1700-1900 Protein:  85-100 grams Fluid:  1.7 - 1.9 L/day  Nutrition Dx:  Inadequate oral intake related to inability to eat, dysphagia (Stroke) as evidenced by NPO status; improving  Goal:  Pt to meet >/= 90% of their estimated nutrition needs; progressing   Intervention:   Discontinue calorie count  Continue Glucerna Shake po TID, each supplement provides 220 kcal and 10 grams of protein  Provide 30 ml Prostat po BID, each supplement provides 100 kcal and 15 grams of protein.   Encourage PO intake.  Corrin Parker, MS, RD, LDN Pager # [phone] After hours/ weekend pager # [phone]

## 2017-07-05 NOTE — Progress Notes (Signed)
Speech Language Pathology Daily Session Note  Patient Details  Name: [name] MRN: [id number] Date of Birth: Apr 12, 1951  Today's Date: 07/05/2017 SLP Individual Time: 0830-0930 SLP Individual Time Calculation (min): 60 min  Short Term Goals: Week 3: SLP Short Term Goal 1 (Week 3): Pt will increase vocal intensity with Mod A cues to achieve ~ 90% intelligiblity at the phrase to sentence level.  SLP Short Term Goal 2 (Week 3): Given Min A cues, pt will self-correct during times of decreased intelligibility.  SLP Short Term Goal 3 (Week 3): Pt will increase sustained attention to task for ~ 30 minutes with Min A cues.  SLP Short Term Goal 4 (Week 3): Pt will demonstrate basic problem solving for familiar tasks related to ADLs with Mod A cues.  SLP Short Term Goal 5 (Week 3): Pt will consume current diet without overt s/s of aspiration and supervision cues for small sips of thin liquids.  SLP Short Term Goal 6 (Week 3): Pt will consume trials of dysphagia 3 with complete oral clearing and Mod A cues for use of compensatory strategies.   Skilled Therapeutic Interventions: Skilled treatment session focused on dysphagia and cognition goals. SLP facilitated session by providing skilled observation of pt consuming dysphagia 2 breakfast tray with thin liquids supplemented by peanut butter graham crackers. Pt able to masticate and manipulate peanut butter graham crackers with more than a reasonable amount of time due to lack of dentures. Pt didn't exhibit any s/s of pocketing or decreased lingual manipulation. Pt prefers to reamain on dysphagia 2 d/t dentures being at home and poor task endurance/toleration. Pt requires Max A emotional support to continue to attempt tasks. Pt easily gives up and doesn't attempt any problem solving d/t poor task tolerance. Pt able to sustain attention for ~ 60 minutes with supervision cues and expresses herself verbally with ~ 95% intelligibility at the sentence level  in mildly noisy environment. Pt was returned to room, transferred to bed and left with all needs within reach and husband/daughter present. Continue per current plan of care.      Function:  Eating Eating   Modified Consistency Diet: Yes Eating Assist Level: Help with picking up utensils;Help managing cup/glass;Helper brings food to mouth       Helper [place] to Mouth: Occasionally   Cognition Comprehension Comprehension assist level: Understands basic 90% of the time/cues < 10% of the time  Expression Expression assistive device: Talk trach valve Expression assist level: Expresses basic 75 - 89% of the time/requires cueing 10 - 24% of the time. Needs helper to occlude trach/needs to repeat words.;Expresses basic 90% of the time/requires cueing < 10% of the time.  Social Interaction Social Interaction assist level: Interacts appropriately 75 - 89% of the time - Needs redirection for appropriate language or to initiate interaction.  Problem Solving Problem solving assist level: Solves basic 25 - 49% of the time - needs direction more than half the time to initiate, plan or complete simple activities  Memory Memory assist level: Recognizes or recalls 50 - 74% of the time/requires cueing 25 - 49% of the time    Pain    Therapy/Group: Individual Therapy  [name] 07/05/2017, 10:26 AM

## 2017-07-05 NOTE — Progress Notes (Addendum)
Physical Therapy Note  Patient Details  Name: [name] MRN: [id number] Date of Birth: Jan 14, 1951 Today's Date: 07/05/2017  tx 1:  0940-1000, 20 min individual tx Pain: 7/10 HA, premedicated  Bed mobility with HOB raised and use of rail, min assist to sit EOB to change shirt, as pt had vomited on shirt during meds.  Supine with HOB at 40 degrees for neuro re-ed: R/L straight leg raises, ankle pumps , ankle circles, R heel slides, R hip abd/adduction with ankle DF.  RLE movements focusing on smoothness of movement and eccentric control via multimodal cues.  Pt left resting in bed with all needs within reach and husband and dtr present.   tx 2:  1545-1554, 9 min individual tx   Pt missed 15 minutes due to (student)Nsg giving meds. Pt declined getting OOB for a short session.   Pt requested working with R hand.  With multimodal cues and hand over hand assistance at times, pt used R hand for fine motor task of manipulating strips of paper.    When pt became frustrated,PT recommended pt use bil hands for this task, and press paper against her body, which greatly increased success. Pt left resting in bed with all needs within reach. Dtr present.   See function navigator for current status. Glendoris Nodarse 07/05/2017, 7:38 AM

## 2017-07-06 ENCOUNTER — Inpatient Hospital Stay (HOSPITAL_COMMUNITY): Payer: Medicare Other | Admitting: Physical Therapy

## 2017-07-06 ENCOUNTER — Inpatient Hospital Stay (HOSPITAL_COMMUNITY): Payer: Medicare Other | Admitting: Speech Pathology

## 2017-07-06 ENCOUNTER — Inpatient Hospital Stay (HOSPITAL_COMMUNITY): Payer: Medicare Other | Admitting: Occupational Therapy

## 2017-07-06 LAB — BASIC METABOLIC PANEL
ANION GAP: 10 (ref 5–15)
BUN: 25 mg/dL — ABNORMAL HIGH (ref 6–20)
CALCIUM: 9.3 mg/dL (ref 8.9–10.3)
CO2: 20 mmol/L — AB (ref 22–32)
Chloride: 104 mmol/L (ref 101–111)
Creatinine, Ser: 1.21 mg/dL — ABNORMAL HIGH (ref 0.44–1.00)
GFR, EST AFRICAN AMERICAN: 53 mL/min — AB (ref >60)
GFR, EST NON AFRICAN AMERICAN: 46 mL/min — AB (ref >60)
Glucose, Bld: 118 mg/dL — ABNORMAL HIGH (ref 65–99)
Potassium: 4 mmol/L (ref 3.5–5.1)
Sodium: 134 mmol/L — ABNORMAL LOW (ref 135–145)

## 2017-07-06 LAB — CBC
HCT: 32.1 % — ABNORMAL LOW (ref 36.0–46.0)
HEMOGLOBIN: 9.9 g/dL — AB (ref 12.0–15.0)
MCH: 27.8 pg (ref 26.0–34.0)
MCHC: 30.8 g/dL (ref 30.0–36.0)
MCV: 90.2 fL (ref 78.0–100.0)
Platelets: 412 10*3/uL — ABNORMAL HIGH (ref 150–400)
RBC: 3.56 MIL/uL — AB (ref 3.87–5.11)
RDW: 14.6 % (ref 11.5–15.5)
WBC: 9.7 10*3/uL (ref 4.0–10.5)

## 2017-07-06 LAB — GLUCOSE, CAPILLARY
GLUCOSE-CAPILLARY: 113 mg/dL — AB (ref 65–99)
GLUCOSE-CAPILLARY: 134 mg/dL — AB (ref 65–99)
GLUCOSE-CAPILLARY: 60 mg/dL — AB (ref 65–99)
GLUCOSE-CAPILLARY: 148 mg/dL — AB (ref 65–99)
GLUCOSE-CAPILLARY: 66 mg/dL (ref 65–99)

## 2017-07-06 MED ORDER — ASPIRIN 325 MG PO TABS
325.0000 mg | ORAL_TABLET | Freq: Every day | ORAL | Status: DC
  Administered 2017-07-07 – 2017-07-08 (×2): 325 mg via ORAL
  Filled 2017-07-06 (×2): qty 1

## 2017-07-06 MED ORDER — PROCHLORPERAZINE 25 MG RE SUPP: 12.5000 mg | Freq: Four times a day (QID) | RECTAL | Status: DC | PRN

## 2017-07-06 MED ORDER — PANTOPRAZOLE SODIUM 40 MG PO TBEC
40.0000 mg | DELAYED_RELEASE_TABLET | Freq: Two times a day (BID) | ORAL | Status: DC
  Administered 2017-07-06 – 2017-07-08 (×4): 40 mg via ORAL
  Filled 2017-07-06 (×3): qty 1

## 2017-07-06 MED ORDER — DOCUSATE SODIUM 100 MG PO CAPS
100.0000 mg | ORAL_CAPSULE | Freq: Every day | ORAL | Status: DC
  Administered 2017-07-08: 100 mg via ORAL
  Filled 2017-07-06: qty 1

## 2017-07-06 MED ORDER — ALUM & MAG HYDROXIDE-SIMETH 200-200-20 MG/5ML PO SUSP: 30.0000 mL | ORAL | Status: DC | PRN

## 2017-07-06 MED ORDER — PROCHLORPERAZINE MALEATE 5 MG PO TABS: 5.0000 mg | ORAL_TABLET | Freq: Four times a day (QID) | ORAL | Status: DC | PRN

## 2017-07-06 MED ORDER — CLOPIDOGREL BISULFATE 75 MG PO TABS
75.0000 mg | ORAL_TABLET | Freq: Every day | ORAL | Status: DC
  Administered 2017-07-07 – 2017-07-08 (×2): 75 mg via ORAL
  Filled 2017-07-06 (×2): qty 1

## 2017-07-06 MED ORDER — GLUCOSE 40 % PO GEL
ORAL | Status: AC
  Administered 2017-07-06: 20:00:00
  Filled 2017-07-06: qty 1

## 2017-07-06 MED ORDER — PROCHLORPERAZINE EDISYLATE 5 MG/ML IJ SOLN: 5.0000 mg | Freq: Four times a day (QID) | INTRAMUSCULAR | Status: DC | PRN

## 2017-07-06 MED ORDER — DIPHENHYDRAMINE HCL 12.5 MG/5ML PO ELIX
12.5000 mg | ORAL_SOLUTION | Freq: Four times a day (QID) | ORAL | Status: DC | PRN
Start: 2017-07-06 — End: 2017-07-08

## 2017-07-06 MED ORDER — METOPROLOL TARTRATE 50 MG PO TABS
100.0000 mg | ORAL_TABLET | Freq: Two times a day (BID) | ORAL | Status: DC
  Administered 2017-07-06 – 2017-07-07 (×3): 100 mg via ORAL
  Filled 2017-07-06 (×3): qty 2

## 2017-07-06 MED ORDER — ALPRAZOLAM 0.25 MG PO TABS
0.2500 mg | ORAL_TABLET | Freq: Once | ORAL | Status: AC
  Administered 2017-07-06: 0.25 mg via ORAL
  Filled 2017-07-06: qty 1

## 2017-07-06 MED ORDER — GUAIFENESIN-DM 100-10 MG/5ML PO SYRP: 5.0000 mL | ORAL_SOLUTION | Freq: Four times a day (QID) | ORAL | Status: DC | PRN

## 2017-07-06 MED ORDER — TRAMADOL HCL 50 MG PO TABS: 50.0000 mg | ORAL_TABLET | Freq: Four times a day (QID) | ORAL | Status: DC | PRN

## 2017-07-06 MED ORDER — FREE WATER
200.0000 mL | Freq: Three times a day (TID) | Status: DC
  Administered 2017-07-06 – 2017-07-08 (×5): 200 mL via ORAL

## 2017-07-06 MED ORDER — PROCHLORPERAZINE MALEATE 5 MG PO TABS
5.0000 mg | ORAL_TABLET | Freq: Three times a day (TID) | ORAL | Status: DC
  Administered 2017-07-07 – 2017-07-08 (×4): 5 mg via ORAL
  Filled 2017-07-06 (×4): qty 1

## 2017-07-06 MED ORDER — STUDY - INVESTIGATIONAL MEDICATION
20.0000 mg | Freq: Three times a day (TID) | Status: DC
  Administered 2017-07-07 – 2017-07-08 (×3): 20 mg via ORAL
  Filled 2017-07-06 (×4): qty 1

## 2017-07-06 MED ORDER — ACETAZOLAMIDE 250 MG PO TABS
250.0000 mg | ORAL_TABLET | Freq: Two times a day (BID) | ORAL | Status: DC
  Administered 2017-07-07 – 2017-07-08 (×3): 250 mg via ORAL
  Filled 2017-07-06 (×3): qty 1

## 2017-07-06 NOTE — Progress Notes (Signed)
RT removed patient trach per MD order with assistance from another RT. No apparent complications, patient was able to speak, vitals were stable throughout and currently. Dressing applied to stoma site. RT will continue to monitor.

## 2017-07-06 NOTE — Progress Notes (Signed)
Physical Therapy Session Note  Patient Details  Name: [name] MRN: [id number] Date of Birth: [birth year]  Today's Date: 07/06/2017 PT Individual Time: 1600-1630 PT Individual Time Calculation (min): 30 min  PT Missed Time Calculation (min): 30 min (patient unwilling to participate)   Short Term Goals: Week 2:  PT Short Term Goal 1 (Week 2): pt will perform functional transfers with max A PT Short Term Goal 2 (Week 2): pt will tolerate out of bed in w/c x 2 hours per day not in therapy time  Skilled Therapeutic Interventions/Progress Updates:   Pt supine in bed upon arrival and agreeable to therapy, no c/o pain. Worked on functional mobility and endurance/tolerance to OOB activity this session. Pt transitioned to EOB and w/c w/ close supervision and increased time, she self-propelled w/c 50' w/ bilateral UEs. Pt limited in distance by fatigue. NuStep 5 min @ L1 w/ increased fatigue. Pt c/o of "tremors" in hands at this point and requesting to end session and return to bed. Pt noted to have increased anxiety and nervousness at this point and transferred w/c>bed> supine w/ supervision. Ended session in bed and in care of husband, all needs met.   Therapy Documentation Precautions:  Precautions Precautions: Fall Precaution Comments: Trach capped Restrictions Weight Bearing Restrictions: No Vital Signs: Therapy Vitals Temp: 97.6 F (36.4 C) Temp Source: Oral Pulse Rate: 92 Resp: 18 BP: 123/64 Patient Position (if appropriate): Lying Oxygen Therapy SpO2: 98 % O2 Device: Not Delivered  See Function Navigator for Current Functional Status.   Therapy/Group: Individual Therapy  Kira Hartl K Arnette 07/06/2017, 4:23 PM

## 2017-07-06 NOTE — Progress Notes (Signed)
Occupational Therapy Session Note  Patient Details  Name: [name] MRN: [id number] Date of Birth: 1951-09-25  Today's Date: 07/06/2017 OT Individual Time: 1030-1108 OT Individual Time Calculation (min): 38 min    Short Term Goals: Week 3:  OT Short Term Goal 1 (Week 3): STG=LTG due to LOS  Skilled Therapeutic Interventions/Progress Updates:    Treatment session with focus on sitting balance and RUE NMR.  Pt received supine in bed reporting not sleeping over night and increased anxiety and tremors this AM.  Pt's husband present, providing encouragement to participate.  Completed bed mobility with mod assist with cues for sequencing to sit at EOB.  Pt declining OOB activity and any transfer training.  Engaged in [place] and therapeutic activity with focus on increased coordination and strength with use of pegs in resistive peg board.  Pt with increased tremors and reporting dizziness, requesting to return to supine.  Vitals assessed 157/77 with pt with increased anxiety and refusing additional therapy.  Returned to bed and notified RN of current status.    Therapy Documentation Precautions:  Precautions Precautions: Fall Precaution Comments: Trach capped Restrictions Weight Bearing Restrictions: No General:   Vital Signs: Therapy Vitals Temp: 97.6 F (36.4 C) Temp Source: Oral Pulse Rate: 92 Resp: 18 BP: 123/64 Patient Position (if appropriate): Lying Oxygen Therapy SpO2: 98 % O2 Device: Not Delivered Pain:  Pt with no c/o pain  See Function Navigator for Current Functional Status.   Therapy/Group: Individual Therapy  Simonne Come 07/06/2017, 3:38 PM

## 2017-07-06 NOTE — Progress Notes (Signed)
Physical Therapy Session Note  Patient Details  Name: MERRELL RETTINGER MRN: [id number] Date of Birth: 1951/05/26  Today's Date: 07/06/2017 PT Individual Time: 1345-1415 PT Individual Time Calculation (min): 30 min   Short Term Goals: Week 3:     Skilled Therapeutic Interventions/Progress Updates:    no c/o pain.  Session focus on activity tolerance via transfers and w/c mobility.    Pt transfers supine>sit with HOB elevated and bed rails with supervision.  Stand/pivot transfer to w/c with RW and supervision (PT steadied walker as pt pulled up on walker).  W/C propulsion x60' +50' with supervision and increased time.  Rest breaks provided between trials.  Pt returned to room at end of session and positioned upright in w/c with call bell in reach and needs met.   Therapy Documentation Precautions:  Precautions Precautions: Fall Precaution Comments: Trach capped Restrictions Weight Bearing Restrictions: No   See Function Navigator for Current Functional Status.   Therapy/Group: Individual Therapy  Michel Santee 07/06/2017, 2:57 PM

## 2017-07-06 NOTE — Progress Notes (Signed)
Speech Language Pathology Daily Session Note  Patient Details  Name: [name] MRN: [id number] Date of Birth: June 04, 1951  Today's Date: 07/06/2017 SLP Individual Time: 1530-1600; 1530-1600 SLP Individual Time Calculation (min): 30 min; 30 min   Short Term Goals: Week 3: SLP Short Term Goal 1 (Week 3): Pt will increase vocal intensity with Mod A cues to achieve ~ 90% intelligiblity at the phrase to sentence level.  SLP Short Term Goal 2 (Week 3): Given Min A cues, pt will self-correct during times of decreased intelligibility.  SLP Short Term Goal 3 (Week 3): Pt will increase sustained attention to task for ~ 30 minutes with Min A cues.  SLP Short Term Goal 4 (Week 3): Pt will demonstrate basic problem solving for familiar tasks related to ADLs with Mod A cues.  SLP Short Term Goal 5 (Week 3): Pt will consume current diet without overt s/s of aspiration and supervision cues for small sips of thin liquids.  SLP Short Term Goal 6 (Week 3): Pt will consume trials of dysphagia 3 with complete oral clearing and Mod A cues for use of compensatory strategies.   Skilled Therapeutic Interventions:Pt was seen for skilled ST targeting dysphagia goals.  Pt just decannulated prior to therapy session with some soreness around stoma; however, no other complaints.  Pt with increased difficulty achieving voicing but this should improve as site heals.  Pt consumed dys 1 textures and thin liquids with no overt s/s of aspiration.  She declined solids at this time due to feeling sick from "medicine" administered last night.  Pt was left in bed with husband at bedside.  Continue per current plan of care.    Session 2: Pt was seen for skilled ST targeting cognitive goals.  SLP administered the MoCA-blind to measure progress from initial evaluation.  Pt scored 8/22 which is indicative of significant cognitive impairment (n >/=18).  Deficits were generalized across all cognitive domains.  Results were reviewed with  pt and husband who verbalized understanding.  SLP also provided ideas for activities to maximize cognitive remediation and compensation.  Pt was left in bed with bed alarm set and husband at bedside.  Continue per current plan of care.       Function:  Eating Eating   Modified Consistency Diet: Yes Eating Assist Level: Help with picking up utensils;Help managing cup/glass;Helper brings food to mouth           Cognition Comprehension Comprehension assist level: Understands basic 90% of the time/cues < 10% of the time  Expression   Expression assist level: Expresses basic 50 - 74% of the time/requires cueing 25 - 49% of the time. Needs to repeat parts of sentences.  Social Interaction Social Interaction assist level: Interacts appropriately 75 - 89% of the time - Needs redirection for appropriate language or to initiate interaction.  Problem Solving Problem solving assist level: Solves basic 25 - 49% of the time - needs direction more than half the time to initiate, plan or complete simple activities  Memory Memory assist level: Recognizes or recalls 50 - 74% of the time/requires cueing 25 - 49% of the time    Pain Pain Assessment Pain Assessment: No/denies pain  Therapy/Group: Individual Therapy  Mcgwire Dasaro, Selinda Orion 07/06/2017, 4:33 PM

## 2017-07-06 NOTE — Progress Notes (Signed)
[facility] PHYSICAL MEDICINE & REHABILITATION     PROGRESS NOTE  Subjective/Complaints: Trach capped>72 hours, no coughing  Starting to take more po as well Slept well after xanax but feels jittery this am, some increased nausea as well  ROS: Denies  vomiting , no abdominal pain, no breathing problems.  Objective:  Vital Signs: Blood pressure 111/70, pulse 80, temperature 98 F (36.7 C), temperature source Oral, resp. rate 18, height 5\' 4"  (1.626 m), weight 77.8 kg (171 lb 8.3 oz), SpO2 99 %. No results found.  Recent Labs  07/06/17 0714  WBC 9.7  HGB 9.9*  HCT 32.1*  PLT 412*    Recent Labs  07/06/17 0714  NA 134*  K 4.0  CL 104  GLUCOSE 118*  BUN 25*  CREATININE 1.21*  CALCIUM 9.3   CBG (last 3)   Recent Labs  07/05/17 1622 07/05/17 2112 07/06/17 0625  GLUCAP 101* 159* 113*    Wt Readings from Last 3 Encounters:  07/06/17 77.8 kg (171 lb 8.3 oz)  06/18/17 76.4 kg (168 lb 6.9 oz)  01/15/16 79.3 kg (174 lb 12.8 oz)    Physical Exam:  BP 111/70 (BP Location: Left Arm)   Pulse 80   Temp 98 F (36.7 C) (Oral)   Resp 18   Ht 5\' 4"  (1.626 m)   Wt 77.8 kg (171 lb 8.3 oz)   SpO2 99%   BMI 29.44 kg/m  Constitutional: She appears well-developed and well-nourished.  HENT: Normocephalic and atraumatic.  Eyes: EOM are normal. No discharge Neck: [place] #4 without drainage today capped Cardiovascular: RRR. No JVD Respiratory: normal effort. Fair flow, rhonchi heard GI: distended , reduced BS, NT  +tympany Musculoskeletal: She exhibits no edema or tenderness.  Neurological: Alert  Left facial droop with dysarthric speech.  Right sided weakness with decrease in motor control.  Right homonymous hemianopsia RUE grossly 3- to 3/5.[place] Kitchen  RLE grossly 4-/5.  Skin: Skin is warm and dry. She is not diaphoretic.  Psychiatric: affect flat, processing delayed Assessment/Plan: 1. 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 Physiatrist is providing close team supervision and 24 hour management of active medical problems listed below. Physiatrist and rehab team continue to assess barriers to discharge/monitor patient progress toward functional and medical goals.  Function:  Bathing Bathing position   Position: Shower  Bathing parts Body parts bathed by patient: Chest, Abdomen, Right arm, Left arm, Front perineal area, Right upper leg, Left upper leg, Buttocks, Right lower leg, Left lower leg Body parts bathed by helper: Back  Bathing assist Assist Level: Touching or steadying assistance(Pt > 75%)      Upper Body Dressing/Undressing Upper body dressing   What is the patient wearing?: Pull over shirt/dress     Pull over shirt/dress - Perfomed by patient: Thread/unthread left sleeve, Put head through opening, Pull shirt over trunk, Thread/unthread right sleeve Pull over shirt/dress - Perfomed by helper: Thread/unthread right sleeve        Upper body assist Assist Level: Touching or steadying assistance(Pt > 75%)   Set up : To obtain clothing/put away  Lower Body Dressing/Undressing Lower body dressing   What is the patient wearing?: Pants, Non-skid slipper socks     Pants- Performed by patient: Thread/unthread right pants leg, Thread/unthread left pants leg, Pull pants up/down Pants- Performed by helper: Thread/unthread right pants leg, Thread/unthread left pants leg,  Pull pants up/down   Non-skid slipper socks- Performed by helper: Don/doff right sock, Don/doff left sock                  Lower body assist Assist for lower body dressing: Touching or steadying assistance (Pt > 75%)      Toileting Toileting Toileting activity did not occur: Refused Toileting steps completed by patient: Performs perineal hygiene Toileting steps completed by helper: Adjust clothing prior to  toileting, Performs perineal hygiene, Adjust clothing after toileting    Toileting assist Assist level:  (max A)   Transfers Chair/bed transfer   Chair/bed transfer method: Stand pivot Chair/bed transfer assist level: Touching or steadying assistance (Pt > 75%) Chair/bed transfer assistive device: Armrests, Walker Mechanical lift: Ecologist Ambulation activity did not occur: Safety/medical concerns   Max distance: 38ft  Assist level: Touching or steadying assistance (Pt > 75%)   Wheelchair   Type: Manual Max wheelchair distance: 25' Assist Level: Supervision or verbal cues  Cognition Comprehension Comprehension assist level: Understands basic 90% of the time/cues < 10% of the time  Expression Expression assist level: Expresses basic 75 - 89% of the time/requires cueing 10 - 24% of the time. Needs helper to occlude trach/needs to repeat words.  Social Interaction Social Interaction assist level: Interacts appropriately 75 - 89% of the time - Needs redirection for appropriate language or to initiate interaction.  Problem Solving Problem solving assist level: Solves basic 25 - 49% of the time - needs direction more than half the time to initiate, plan or complete simple activities  Memory Memory assist level: Recognizes or recalls 50 - 74% of the time/requires cueing 25 - 49% of the time    Medical Problem List and Plan:  1. 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 DVT Prophylaxis/Anticoagulation: Pharmaceutical: Lovenox PLT 414K on 9/12 3. Headaches/Pain Management:   Topamax started 9/1--pt with persistent headaches   -increase to 50mg  twice a day, also on Diamox, Will follow-up with Dr. Jaynee Eagles 4. Mood: LCSW to follow for evaluation and support.  5. Neuropsych: This patient is capable of making  decisions on her own behalf with assistance.  6. Skin/Wound Care: Routine pressure relief measures.  7. Fluids/Electrolytes/Nutrition: Monitor I/Os Off tube feeds monitor meal intake as well as fluid intake, took 50% of lunch today. Fluid intake less than 200 mL today will increase flushes 8. MSSA PNA: Completed treatment 7 day course of cefazolin on 8/30. Continue to monitor with strict aspiration precautions--HOB > 30 degrees at all times.  9. VDRF: s/p trach, #4 being capped , hope to decannulate today 8     10 HTN: Monitor BP bid--on lopressor bid and catapres Vitals:   07/06/17 0315 07/06/17 0436  BP:  111/70  Pulse: 74 80  Resp: 16 18  Temp:  98 F (36.7 C)  SpO2: 98% 99%    bp controlled . 07/06/2017, blood pressure on the low side, may be secondary to reduced fluid intake, will reduce Catapres patch dose to 0.2mg  start tomorrow 11. ABLA: Improved with transfusion.   Hemoglobin 9.6 on 9/13 Continue to monitor 12. Anxiety disorder:Team to provide ego support. Daughter staying to encourage patient. Palliative following for support and placed on IV ativan tid for symptoms management--decrease to prn due to sedation.  13. T2DM: Hgb A1C-  - CBG (  last 3)   Recent Labs  07/05/17 1622 07/05/17 2112 07/06/17 0625  GLUCAP 101* 159* 113*  Fair control   -Reduce lantus to 20u daily . Off TF , May need to make further adjustments depending on intake  -continue scheduled novolog 14. 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 Leucocytosis:resolved .Monitor for signs of infection.   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 Per neurology when she has an outpatient appointment with Dr. Jaynee Eagles 18. B-MCA stenosis/L-ICA stenosis s/p revascularization: On ASA/Plavix.     LOS (Days) 18 A FACE TO FACE EVALUATION WAS PERFORMED  Charlett Blake 07/06/2017 8:53 AM

## 2017-07-07 ENCOUNTER — Inpatient Hospital Stay (HOSPITAL_COMMUNITY): Payer: Medicare Other | Admitting: Speech Pathology

## 2017-07-07 ENCOUNTER — Inpatient Hospital Stay (HOSPITAL_COMMUNITY): Payer: Medicare Other | Admitting: Occupational Therapy

## 2017-07-07 ENCOUNTER — Inpatient Hospital Stay (HOSPITAL_COMMUNITY): Payer: Medicare Other | Admitting: Physical Therapy

## 2017-07-07 ENCOUNTER — Inpatient Hospital Stay (HOSPITAL_COMMUNITY): Payer: Medicare Other

## 2017-07-07 LAB — GLUCOSE, CAPILLARY
Glucose-Capillary: 105 mg/dL — ABNORMAL HIGH (ref 65–99)
Glucose-Capillary: 206 mg/dL — ABNORMAL HIGH (ref 65–99)
Glucose-Capillary: 97 mg/dL (ref 65–99)
Glucose-Capillary: 161 mg/dL — ABNORMAL HIGH (ref 65–99)

## 2017-07-07 NOTE — Progress Notes (Signed)
Occupational Therapy Session Note  Patient Details  Name: [name] MRN: [id number] Date of Birth: [birth year]  Today's Date: 07/07/2017 OT Individual Time: 5885-0277 OT Individual Time Calculation (min): 60 min    Short Term Goals: Week 3:  OT Short Term Goal 1 (Week 3): STG=LTG due to LOS  Skilled Therapeutic Interventions/Progress Updates:    Pt seen for OT session focuisng on functional transfers, family education, and d/c planning. Pt in supine upon arrival, requiring some encouragement for participation, however, when stated that eh needed to be completed in order for pt to d/c she was more willing. She transferred to EOB using hospital bed functions wit supervision. She dressed LB seated EOB with steadying assist when standing to pull up pants. She ambulated to w/c, one posterior LB epsiode requiring mod-max A to correct. In ADL apartment, completed simulated tub/shower transfer x2 trials, first with assist from therapist and then second trial with daughter providing assist. Provided education regarding set-up of DME and recommendation for grab bars. She returned to room and daughter provided assist for ambulating into bathroom and completing toileting tasks. Pt then ate breakfast with assist required for set-up. Provided with built up gripper and recommendation for weighted utensils due to tremors in B UEs R>L. Pt left seated in w/c at end of session, daughter present and hand off to SLP.   Therapy Documentation Precautions:  Precautions Precautions: Fall Precaution Comments: Trach capped Restrictions Weight Bearing Restrictions: No Pain:   No/ denies pain  See Function Navigator for Current Functional Status.   Therapy/Group: Individual Therapy  Lewis, Marine Lezotte C 07/07/2017, 7:10 AM

## 2017-07-07 NOTE — Progress Notes (Signed)
Occupational Therapy Discharge Summary  Patient Details  Name: [name] MRN: [id number] Date of Birth: 1950/12/13   Patient has met 53 of 68 long term goals due to improved activity tolerance, improved balance, postural control, ability to compensate for deficits, functional use of  RIGHT upper and LEFT upper extremity, improved attention, improved awareness and improved coordination.  Patient to discharge at [facility] Assist level.  Patient's care partner is independent to provide the necessary physical and cognitive assistance at discharge.  Pt's daughter has been present throughout rehab admission and is aware of pt's deficits as well as type of assist pt requires. Pt can be self limiting with decreased motivation and poor problem solving skills, however, she has demonstrated ability to perform at overall min A level.  Pt and daughter both feel ready for d/c home.    Recommendation:  Patient will benefit from ongoing skilled OT services in home health setting to continue to advance functional skills in the area of BADL and Reduce care partner burden.  Equipment: BSC and tub transfer bench  Reasons for discharge: treatment goals met and discharge from hospital  Patient/family agrees with progress made and goals achieved: Yes  OT Discharge Precautions/Restrictions  Precautions Precautions: Fall Restrictions Weight Bearing Restrictions: No Vision Baseline Vision/History: (P)  (impaired vision at baseline) Wears Glasses: Reading only Patient Visual Report: No change from baseline Vision Assessment?: No apparent visual deficits Eye Alignment: Within Functional Limits Ocular Range of Motion: Within Functional Limits Alignment/Gaze Preference: Within Defined Limits Cognition Overall Cognitive Status: Impaired/Different from baseline Arousal/Alertness: Awake/alert Orientation Level: Oriented X4 Attention: Sustained Sustained Attention: Impaired Sustained Attention  Impairment: Verbal complex;Functional complex Memory: Impaired Memory Impairment: Decreased recall of new information Awareness: Impaired Awareness Impairment: Intellectual impairment;Emergent impairment Problem Solving: Impaired Problem Solving Impairment: Verbal complex;Functional basic;Verbal basic Executive Function:  (All impaired due to lower level deficits) Behaviors: Poor frustration tolerance Safety/Judgment: Impaired Sensation Sensation Light Touch: Impaired Detail Light Touch Impaired Details: Impaired RUE Proprioception: Impaired Detail Proprioception Impaired Details: Impaired RUE Coordination Gross Motor Movements are Fluid and Coordinated: No Fine Motor Movements are Fluid and Coordinated: No Motor  Motor Motor: Abnormal tone;Hemiplegia;Abnormal postural alignment and control Motor - Discharge Observations: Generalized weakness and ataxia R>L Trunk/Postural Assessment  Cervical Assessment Cervical Assessment: Exceptions to [facility] (Forward head) Thoracic Assessment Thoracic Assessment: Exceptions to [facility] (Kyphotic) Lumbar Assessment Lumbar Assessment: Exceptions to [facility] (Posterior pelvic tilt) Postural Control Postural Control: Deficits on evaluation  Balance Balance Balance Assessed: Yes Static Sitting Balance Static Sitting - Balance Support: Feet supported Static Sitting - Level of Assistance: 5: Stand by assistance Dynamic Sitting Balance Dynamic Sitting - Balance Support: During functional activity;Feet supported Dynamic Sitting - Level of Assistance: 5: Stand by assistance Sitting balance - Comments: Sitting EOB to dress LB Static Standing Balance Static Standing - Balance Support: During functional activity;Right upper extremity supported;Left upper extremity supported Static Standing - Level of Assistance: 5: Stand by assistance Dynamic Standing Balance Dynamic Standing - Balance Support: During functional activity;Right upper extremity supported;Left  upper extremity supported Dynamic Standing - Level of Assistance: 4: Min assist Dynamic Standing - Comments: Standing to complete LB dressing and toileting tasks Extremity/Trunk Assessment RUE Assessment RUE Assessment: Exceptions to [facility] RUE Strength RUE Overall Strength: Deficits (3+/5 throughout) LUE Assessment LUE Assessment: Within Functional Limits   See Function Navigator for Current Functional Status.  Bobby Rumpf, Audryanna Zurita C 07/07/2017, 3:32 PM

## 2017-07-07 NOTE — Progress Notes (Signed)
Speech Language Pathology Discharge Summary  Patient Details  Name: [name] MRN: [id number] Date of Birth: Oct 12, 1951  Today's Date: 07/07/2017 SLP Individual Time: 0830-0930 SLP Individual Time Calculation (min): 60 min   Skilled Therapeutic Interventions:  Skilled treatment session focused on dysphagia and completion of education for discharge planning. Current diet and diet items reviewed as well as recent score on [place]. Pt and daughter voice understanding of cognitive deficits and need for active 24 hour supervision. Pt is requesting to discharge 1 day earlier to prepare for son's wedding.      Patient has met 10 of 10 long term goals.  Patient to discharge at overall Supervision;Min level.    Clinical Impression/Discharge Summary:   Pt has made progress during skilled ST sessions and as a result, she has met all of her LTGs. She is currently at a Min A to supervision level of assist. Extensive education provided to daughter on level of support needed at home. Daughter has been present for all sessions and is able to provided Min A level of support.   Care Partner:  Caregiver Able to Provide Assistance: Yes  Type of Caregiver Assistance: Cognitive  Recommendation:  Home Health SLP;24 hour supervision/assistance  Rationale for SLP Follow Up: Maximize functional communication;Maximize cognitive function and independence;Maximize swallowing safety;Reduce caregiver burden   Equipment:     Reasons for discharge: Treatment goals met   Patient/Family Agrees with Progress Made and Goals Achieved: Yes   Function:  Eating Eating   Modified Consistency Diet: Yes Eating Assist Level: Supervision or verbal cues       Helper Brings Food to Mouth: Occasionally   Cognition Comprehension Comprehension assist level: Understands basic 90% of the time/cues < 10% of the time  Expression   Expression assist level: Expresses basic 90% of the time/requires cueing < 10% of the  time.;Expresses basic 75 - 89% of the time/requires cueing 10 - 24% of the time. Needs helper to occlude trach/needs to repeat words.  Social Interaction Social Interaction assist level: Interacts appropriately 75 - 89% of the time - Needs redirection for appropriate language or to initiate interaction.;Interacts appropriately 90% of the time - Needs monitoring or encouragement for participation or interaction.  Problem Solving Problem solving assist level: Solves basic 50 - 74% of the time/requires cueing 25 - 49% of the time;Solves basic 75 - 89% of the time/requires cueing 10 - 24% of the time  Memory Memory assist level: Recognizes or recalls 75 - 89% of the time/requires cueing 10 - 24% of the time;Recognizes or recalls 90% of the time/requires cueing < 10% of the time   Latunya Kissick 07/07/2017, 10:56 AM

## 2017-07-07 NOTE — Progress Notes (Signed)
Physical Therapy Discharge Summary  Patient Details  Name: [name] MRN: [id number] Date of Birth: Mar 08, 1951  Today's Date: 07/07/2017 PT Individual Time: 1500-1600   60 min    Patient has met 8 of 8 long term goals due to improved activity tolerance, improved balance, improved postural control, increased strength, increased range of motion, decreased pain, functional use of  right upper extremity and right lower extremity and improved awareness.  Patient to discharge at an ambulatory level Supervision.   Patient's care partner is independent to provide the necessary physical and cognitive assistance at discharge.  Reasons goals not met: all treatment goals met.   Recommendation:  Patient will benefit from ongoing skilled PT services in home health setting to continue to advance safe functional mobility, address ongoing impairments in balance coordination, transfers, strength, endurance, and minimize fall risk.  Equipment: RW and Transport WC  Reasons for discharge: treatment goals met and discharge from hospital  Patient/family agrees with progress made and goals achieved: Yes   PT treatment:  Pt instructed pt in day assessment to measure progress towards goals. See below for details. Pt reports cramping pain in BLE throughout treatment, but ambulating and performing all transfers at supervision level with RW and constant encouragement for participation.  Pt returned to room and performed ambulatory transfer to bed with supervision assist with RW. Sit>supine completed with supervision, and pt left supine in bed with call bell in reach and all needs met.    PT Discharge Precautions/Restrictions Precautions Precautions: Fall Restrictions Weight Bearing Restrictions: No Vital Signs Therapy Vitals Temp: 97.9 F (36.6 C) Temp Source: Oral Pulse Rate: 70 Resp: 18 BP: (!) 112/49 Patient Position (if appropriate): Lying Oxygen Therapy SpO2: 100 % O2 Device: Not  Delivered Pain   9/10 BLE. Cramping.  Vision/Perception  Vision - Assessment Eye Alignment: Within Functional Limits Ocular Range of Motion: Within Functional Limits Alignment/Gaze Preference: Within Defined Limits  Cognition Overall Cognitive Status: Impaired/Different from baseline Arousal/Alertness: Awake/alert Orientation Level: Oriented X4 Attention: Sustained Sustained Attention: Impaired Sustained Attention Impairment: Verbal complex;Functional complex Memory: Impaired Memory Impairment: Decreased recall of new information Awareness: Impaired Awareness Impairment: Intellectual impairment;Emergent impairment Problem Solving: Impaired Problem Solving Impairment: Verbal complex;Functional basic;Verbal basic Executive Function:  (All impaired due to lower level deficits) Behaviors: Poor frustration tolerance Safety/Judgment: Impaired Sensation Sensation Light Touch: Impaired Detail Light Touch Impaired Details: Impaired RUE;Impaired RLE Proprioception: Impaired Detail Proprioception Impaired Details: Impaired RUE;Impaired RLE Coordination Gross Motor Movements are Fluid and Coordinated: No Fine Motor Movements are Fluid and Coordinated: No Motor  Motor Motor: Abnormal tone;Hemiplegia;Abnormal postural alignment and control Motor - Discharge Observations: Generalized weakness and ataxia R>L  Mobility Bed Mobility Bed Mobility: Sit to Supine Rolling Right: 5: Supervision Supine to Sit: 5: Supervision Sit to Supine: 5: Supervision Transfers Sit to Stand: 5: Supervision (with RW ) Squat Pivot Transfers: 5: Supervision (with RW ) Squat Pivot Transfer Details (indicate cue type and reason): Car transfer with min assist due to mild posterior LOB.  Locomotion  Ambulation Ambulation: Yes Ambulation/Gait Assistance: 5: Supervision Ambulation Distance (Feet): 50 Feet Assistive device: Rolling walker Gait Gait: Yes Gait Pattern: Impaired Gait Pattern: Ataxic Stairs /  Additional Locomotion Stairs: Yes Stairs Assistance: 4: Min assist Stairs Assistance Details: Verbal cues for technique;Verbal cues for precautions/safety Stair Management Technique: Two rails Number of Stairs: 12 Height of Stairs: 6 (and 3) Product manager Mobility: Yes Wheelchair Assistance: 5: Careers information officer: Both upper extremities Wheelchair Parts Management: Needs assistance Distance: 30f  Trunk/Postural Assessment  Cervical Assessment Cervical Assessment: Exceptions to [facility] (Forward head) Thoracic Assessment Thoracic Assessment: Exceptions to [facility] (Kyphotic) Lumbar Assessment Lumbar Assessment: Exceptions to [facility] (Posterior pelvic tilt) Postural Control Postural Control: Deficits on evaluation  Balance Balance Balance Assessed: Yes Static Sitting Balance Static Sitting - Balance Support: Feet supported Static Sitting - Level of Assistance: 5: Stand by assistance Dynamic Sitting Balance Dynamic Sitting - Balance Support: During functional activity;Feet supported Dynamic Sitting - Level of Assistance: 5: Stand by assistance Sitting balance - Comments: Sitting EOB to dress LB Static Standing Balance Static Standing - Balance Support: During functional activity;Right upper extremity supported;Left upper extremity supported Static Standing - Level of Assistance: 5: Stand by assistance Dynamic Standing Balance Dynamic Standing - Balance Support: During functional activity;Right upper extremity supported;Left upper extremity supported Dynamic Standing - Level of Assistance: 4: Min assist Dynamic Standing - Comments: Standing to complete LB dressing and toileting tasks Extremity Assessment  RUE Assessment RUE Assessment: Exceptions to [facility] RUE Strength RUE Overall Strength: Deficits (3+/5 throughout) LUE Assessment LUE Assessment: Within Functional Limits RLE Assessment RLE Assessment: Exceptions to [facility] RLE Strength RLE Overall Strength  Comments: grossly 4-/5 at hip/knee LLE Assessment LLE Assessment: Exceptions to [facility] LLE Strength LLE Overall Strength Comments: at least 4/5 at hip and knee   See Function Navigator for Current Functional Status.  Lorie Phenix 07/07/2017, 3:41 PM

## 2017-07-07 NOTE — Progress Notes (Signed)
Occupational Therapy Note  Patient Details  Name: [name] MRN: [id number] Date of Birth: [birth year]  Today's Date: 07/07/2017 OT Individual Time: 1100-1130 OT Individual Time Calculation (min): 30 min   Pt denies pain Individual Therapy  Pt resting in bed upon arrival.  Pt initially resistant to participating until informed that she would be doing BUE therex at bed level.  Pt issued a "stress egg" to improve R hand grasp strength.  Pt able to flex her thumb this morning and attributed this to her husband "working it" the day before.  Pt engaged in BUE therex at bed level with 2# weight bar-arm raises and chest presses (3X8 each exercises).  Pt's RUE noticeably fatigued upon completion of last exercise.  Pt's husband entered room approx 15 mins into session.  Pt remained in bed with all needs within reach and husband present.    Leotis Shames [facility] 07/07/2017, 11:37 AM

## 2017-07-07 NOTE — Progress Notes (Signed)
[place] PHYSICAL MEDICINE & REHABILITATION     PROGRESS NOTE  Subjective/Complaints: No issues overnite, decannulated, O2 sats  ROS: Denies  vomiting , no abdominal pain, no breathing problems.  Objective:  Vital Signs: Blood pressure 112/67, pulse 66, temperature 99 F (37.2 C), temperature source Oral, resp. rate 18, height 5' 4"  (1.626 m), weight 78.1 kg (172 lb 4.5 oz), SpO2 95 %. No results found.  Recent Labs  07/06/17 0714  WBC 9.7  HGB 9.9*  HCT 32.1*  PLT 412*    Recent Labs  07/06/17 0714  NA 134*  K 4.0  CL 104  GLUCOSE 118*  BUN 25*  CREATININE 1.21*  CALCIUM 9.3   CBG (last 3)   Recent Labs  07/06/17 1939 07/06/17 2019 07/07/17 0647  GLUCAP 66 148* 105*    Wt Readings from Last 3 Encounters:  07/07/17 78.1 kg (172 lb 4.5 oz)  06/18/17 76.4 kg (168 lb 6.9 oz)  01/15/16 79.3 kg (174 lb 12.8 oz)    Physical Exam:  BP 112/67 (BP Location: Left Arm)   Pulse 66   Temp 99 F (37.2 C) (Oral)   Resp 18   Ht 5' 4"  (1.626 m)   Wt 78.1 kg (172 lb 4.5 oz)   SpO2 95%   BMI 29.57 kg/m  Constitutional: She appears well-developed and well-nourished.  HENT: Normocephalic and atraumatic.  Eyes: EOM are normal. No discharge Neck: Shiley #4 without drainage today capped Cardiovascular: RRR. No JVD Respiratory: normal effort. Fair flow, rhonchi heard GI: distended , reduced BS, NT  +tympany Musculoskeletal: She exhibits no edema or tenderness.  Neurological: Alert  Left facial droop with dysarthric speech.  Right sided weakness with decrease in motor control.  Right homonymous hemianopsia RUE grossly 3- to 3/5.Marland Kitchen  RLE grossly 4-/5.  Skin: Skin is warm and dry. She is not diaphoretic.  Psychiatric: affect flat, processing delayed Assessment/Plan: 1. 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 Physiatrist is providing close team supervision and 24 hour management of active medical problems listed below. Physiatrist and rehab team continue to assess barriers to discharge/monitor patient progress toward functional and medical goals.  Function:  Bathing Bathing position   Position: Shower  Bathing parts Body parts bathed by patient: Chest, Abdomen, Right arm, Left arm, Front perineal area, Right upper leg, Left upper leg, Buttocks, Right lower leg, Left lower leg Body parts bathed by helper: Back  Bathing assist Assist Level: Touching or steadying assistance(Pt > 75%)      Upper Body Dressing/Undressing Upper body dressing   What is the patient wearing?: Pull over shirt/dress     Pull over shirt/dress - Perfomed by patient: Thread/unthread left sleeve, Put head through opening, Pull shirt over trunk, Thread/unthread right sleeve Pull over shirt/dress - Perfomed by helper: Thread/unthread right sleeve        Upper body assist Assist Level: Touching or steadying assistance(Pt > 75%)   Set up : To obtain clothing/put away  Lower Body Dressing/Undressing Lower body dressing   What is the patient wearing?: Pants, Non-skid slipper socks     Pants- Performed by patient: Thread/unthread right pants leg, Thread/unthread left pants leg, Pull pants up/down Pants- Performed by helper: Thread/unthread right pants leg, Thread/unthread left pants leg, Pull pants up/down   Non-skid slipper socks- Performed by helper: Don/doff right sock, Don/doff left sock  Lower body assist Assist for lower body dressing: Touching or steadying assistance (Pt > 75%)      Toileting Toileting Toileting activity did not occur: Refused Toileting steps completed by patient: Performs perineal hygiene Toileting steps completed by helper: Adjust clothing prior to toileting, Performs perineal hygiene, Adjust clothing after toileting    Toileting assist Assist level:  (max  A)   Transfers Chair/bed transfer   Chair/bed transfer method: Stand pivot Chair/bed transfer assist level: Touching or steadying assistance (Pt > 75%) Chair/bed transfer assistive device: Armrests, Walker Mechanical lift: Ecologist Ambulation activity did not occur: Safety/medical concerns   Max distance: 76f  Assist level: Touching or steadying assistance (Pt > 75%)   Wheelchair   Type: Manual Max wheelchair distance: 25' Assist Level: Supervision or verbal cues  Cognition Comprehension Comprehension assist level: Understands basic 90% of the time/cues < 10% of the time  Expression Expression assist level: Expresses basic 50 - 74% of the time/requires cueing 25 - 49% of the time. Needs to repeat parts of sentences.  Social Interaction Social Interaction assist level: Interacts appropriately 75 - 89% of the time - Needs redirection for appropriate language or to initiate interaction.  Problem Solving Problem solving assist level: Solves basic 25 - 49% of the time - needs direction more than half the time to initiate, plan or complete simple activities  Memory Memory assist level: Recognizes or recalls 50 - 74% of the time/requires cueing 25 - 49% of the time    Medical Problem List and Plan:  1. 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 Formulized individual treatment plan based on medical history, underlying problem and comorbidities.   2. DVT Prophylaxis/Anticoagulation: Pharmaceutical: Lovenox PLT 412K on 9/18 3. Headaches/Pain Management:   Topamax started 9/1--pt with persistent headaches   -increase to 528mtwice a day, also on Diamox, Will follow-up with Dr.  AhJaynee Eagles. Mood: LCSW to follow for evaluation and support.  5. Neuropsych: This patient is capable of making decisions on her own behalf with assistance.  6. Skin/Wound Care: Routine pressure relief measures.  7. Fluids/Electrolytes/Nutrition: Monitor I/Os Off tube feeds monitor meal intake as well as fluid intake, took 50% of lunch today. Fluid intake less than 200 mL today will increase flushes 8. MSSA PNA: Completed treatment 7 day course of cefazolin on 8/30. Continue to monitor with strict aspiration precautions--HOB > 30 degrees at all times.  9. VDRF: s/p trach, #4 being capped , hope to decannulate today 8     10 HTN: Monitor BP bid--on lopressor bid and catapres Vitals:   07/07/17 0315 07/07/17 0623  BP:  112/67  Pulse: 76 66  Resp: 16 18  Temp:  99 F (37.2 C)  SpO2: 95% 95%    bp controlled . [id number], blood pressure on the low side, may be secondary to reduced fluid intake, will reduce Catapres patch dose to 0.79m51mtart today 11. ABLA: Improved with transfusion.   Hemoglobin 9.9 on 9/18 Continue to monitor 12. Anxiety disorder:Team to provide ego support. Daughter staying to encourage patient. Palliative following for support and placed on IV ativan tid for symptoms management--decrease to prn due to sedation.  13. T2DM: Hgb A1C-  - CBG (last 3)   Recent Labs  07/06/17  1939 07/06/17 2019 07/07/17 0647  GLUCAP 66 148* 105*  Fair control   -Reduce lantus to 15u daily . Off TF , May need to make further adjustments depending on intake  -continue scheduled novolog 14. 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 Leucocytosis:resolved .Monitor for signs of infection.   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 Per neurology when she has an outpatient appointment with Dr. Jaynee Eagles 18. B-MCA stenosis/L-ICA stenosis s/p revascularization:  On ASA/Plavix.     LOS (Days) 19 A FACE TO FACE EVALUATION WAS PERFORMED  Charlett Blake 07/07/2017 9:36 AM

## 2017-07-08 ENCOUNTER — Inpatient Hospital Stay (HOSPITAL_COMMUNITY): Payer: Medicare Other | Admitting: Occupational Therapy

## 2017-07-08 LAB — GLUCOSE, CAPILLARY: Glucose-Capillary: 151 mg/dL — ABNORMAL HIGH (ref 65–99)

## 2017-07-08 MED ORDER — METOPROLOL TARTRATE 100 MG PO TABS: 100.0000 mg | ORAL_TABLET | Freq: Two times a day (BID) | ORAL | 1 refills | Status: DC

## 2017-07-08 MED ORDER — INSULIN GLARGINE 100 UNITS/ML SOLOSTAR PEN: 20.0000 [IU] | PEN_INJECTOR | Freq: Every day | SUBCUTANEOUS | 11 refills | Status: DC

## 2017-07-08 MED ORDER — ASPIRIN 325 MG PO TABS: 325.0000 mg | ORAL_TABLET | Freq: Every day | ORAL | Status: DC

## 2017-07-08 MED ORDER — CLONIDINE HCL 0.2 MG/24HR TD PTWK: 0.2000 mg | MEDICATED_PATCH | TRANSDERMAL | 0 refills | Status: DC

## 2017-07-08 MED ORDER — FREE WATER: 200.0000 mL | Freq: Three times a day (TID) | Status: DC

## 2017-07-08 MED ORDER — CLOPIDOGREL BISULFATE 75 MG PO TABS: 75.0000 mg | ORAL_TABLET | Freq: Every day | ORAL | 0 refills | Status: DC

## 2017-07-08 MED ORDER — DOCUSATE SODIUM 100 MG PO CAPS: 100.0000 mg | ORAL_CAPSULE | Freq: Every day | ORAL | 0 refills | Status: DC

## 2017-07-08 MED ORDER — PROCHLORPERAZINE MALEATE 5 MG PO TABS: 5.0000 mg | ORAL_TABLET | Freq: Three times a day (TID) | ORAL | 0 refills | Status: DC | PRN

## 2017-07-08 MED ORDER — TOPIRAMATE 50 MG PO TABS: 50.0000 mg | ORAL_TABLET | Freq: Two times a day (BID) | ORAL | 0 refills | Status: DC

## 2017-07-08 MED ORDER — POTASSIUM CHLORIDE 20 MEQ PO PACK: 20.0000 meq | PACK | Freq: Every day | ORAL | 0 refills | Status: DC

## 2017-07-08 NOTE — Progress Notes (Signed)
Discharged to home accompanied by dtr, discharge info given to patient by Algis Liming PAC, no questions noted./ Obtained home med from pharmacy and equipment delivered to room. Belongings taken out on cart with pt. Margarito Liner

## 2017-07-08 NOTE — Patient Care Conference (Signed)
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 Adjustment disorder with mixed anxiety and depressed mood   . Hemoptysis   . Hematemesis   . Intractable vomiting with nausea   . Acute lower UTI   . Urinary retention   . Diabetic gastroparesis ([place])   . Vascular headache   . Diabetes mellitus ([place])   . CVA (cerebral vascular accident) ([place]) 06/18/2017  . Weakness   . 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 Right hemiparesis ([place])   . DNR (do not resuscitate) discussion 06/16/2017  . Palliative care by specialist 06/16/2017  . Anxiety state   . Acute embolic stroke ([place])   . Benign essential HTN   . Diabetic peripheral neuropathy ([place])   . Pseudotumor cerebri   . Hypokalemia   . Seizure ([place])   . Leukocytosis   . Acute blood loss anemia   . Post-operative pain   . Encounter for feeding tube placement   . Respiratory failure ([place])   . Status post tracheostomy ([place])   . Acute respiratory failure with hypoxemia ([place])   . Stenosis of left carotid artery   . Acute CVA (cerebrovascular accident) ([place]) 05/31/2017  . Type II diabetes mellitus with complication ([place]) 62/70/3500  . Hypertension 05/31/2017  . Hyponatremia 05/31/2017  . IIH (idiopathic intracranial hypertension) 01/18/2016  . 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 Worsening headaches 01/18/2016  . Increased abdominal girth 01/18/2016  . Abdominal fluid collection 01/18/2016  . Vision changes 01/18/2016  . Perceived hearing changes 01/18/2016  . Nausea without vomiting 01/18/2016  . Peripheral vision loss 01/18/2016  . Imbalance 01/18/2016  . Falls 01/18/2016  . Dysphagia, oropharyngeal 01/18/2016  . Subacute confusional state 01/18/2016  . Left breast mass 05/05/2011    Expected Discharge Date: Expected Discharge Date: 07/08/17  Team Members Present: Physician leading conference: Dr. Alysia Penna Social Worker Present: Lennart Pall, LCSW Nurse Present: Isla Pence, RN PT Present: Barrie Folk, PT OT Present: Napoleon Form, OT SLP Present: Stormy Fabian, SLP PPS Coordinator present : Daiva Nakayama, RN, CRRN     Current Status/Progress Goal Weekly Team Focus  Medical   nausea improving, decannulated, taking po  home with family support  d/c planning teach family PEG flush   Bowel/Bladder   continent of b/b, LBM 9/17, bedpan, bsc at night, 1-2 assist with stedy to bathroom  contintent of b/b with min assist (need to change goal?)  monitor b/b q shift and prn   Swallow/Nutrition/ Hydration   Dys 2, thin liquids   min assist   toleration of current diet, improved endurance   ADL's   (P) Supervision- min A with encouragement for particpation, independence, and problem solving         Mobility  ambulatory with rw at supervision level  [place] with LRAD for all mobility tasks  D/C planning, gait, transfers, stafety, balance   Communication   min assist for speech intelligibility   supervision, upgraded  continue to address increased vocal intensity    Safety/Cognition/ Behavioral Observations  mod assist   min assist   continue address basic cognition    Pain   c/o headache at times relieved with tylenol or tramadol  pain at or below 2  monitor pain q shift and prn    Skin   trache site, capped 07/06/17. GT site, CDI  maintain skin integrity with mod I assist and no infection  assess skin q shift and prn    Rehab Goals Patient on target to meet rehab goals: Yes *See Care Plan and progress notes for long and short-term goals.     Barriers to Discharge  Current Status/Progress Possible Resolutions Date Resolved   Physician    Home environment access/layout;Other (comments)  therapy compliance  should meet D/C goals  link participation to home d/c      Nursing                  PT                    OT                  SLP                SW                Discharge Planning/Teaching Needs:  Plan home with family able to provide 24/7 assistance.  TEaching completed this week.   Team Discussion:  Lurline Idol out;  Taking po but still with peg and family educated on flushing process.  Needs encouragement to eat.  Meeting all therapy goal and ready to d/c tomorrow (one day earlier than target)  Revisions to Treatment Plan:  Change in d/c date.    Continued Need for Acute Rehabilitation Level of Care: The patient requires daily medical management by a physician with specialized training in physical medicine and rehabilitation for the following conditions: Daily direction of a multidisciplinary physical rehabilitation program to ensure safe treatment while eliciting the highest outcome that is of practical value to the patient.: Yes Daily medical management of patient stability for increased activity during participation in an intensive rehabilitation regime.: Yes Daily analysis of laboratory values and/or radiology reports with any subsequent need for medication adjustment of medical intervention for : Post surgical problems;Mood/behavior problems;Neurological problems  Trenda Corliss 07/08/2017, 2:39 PM

## 2017-07-08 NOTE — Discharge Instructions (Signed)
Inpatient Rehab Discharge Instructions  [name] Discharge date and time: 07/08/17   Activities/Precautions/ Functional Status: Activity: no lifting, driving, or strenuous exercise for till cleared by MD.  Diet: diabetic diet--chopped foods Wound Care: keep wound clean and dry   Functional status:  ___ No restrictions     ___ Walk up steps independently _X_ 24/7 supervision/assistance   ___ Walk up steps with assistance ___ Intermittent supervision/assistance  ___ Bathe/dress independently ___ Walk with walker     _X__ Bathe/dress with assistance ___ Walk Independently    ___ Shower independently ___ Walk with assistance    ___ Shower with assistance _X__ No alcohol     ___ Return to work/school ________   Special Instructions: 1. Check blood sugars before meals and/or at bedtime.  2. Drinks fluids thorough out the day  to keep hydrated.    COMMUNITY REFERRALS UPON DISCHARGE:    Home Health:   PT     OT     ST    RN                     Agency:  [facility]     Phone: [phone]   Medical Equipment/Items Ordered: transport w/c, commode and walker                                                     Agency/Supplier:  [place] @ [phone]   STROKE/TIA DISCHARGE INSTRUCTIONS SMOKING Cigarette smoking nearly doubles your risk of having a stroke & is the single most alterable risk factor  If you smoke or have smoked in the last 12 months, you are advised to quit smoking for your health.  Most of the excess cardiovascular risk related to smoking disappears within a year of stopping.  Ask you doctor about anti-smoking medications  [place] Quit Line: 1-800-QUIT NOW  Free Smoking Cessation Classes [phone]  CHOLESTEROL Know your levels; limit fat & cholesterol in your diet  Lipid Panel     Component Value Date/Time   CHOL 297 (H) 06/02/2017 0741   TRIG 220 (H) 06/10/2017 0630   HDL 49 06/02/2017 0741   CHOLHDL 6.1 06/02/2017 0741   VLDL 37  06/02/2017 0741   LDLCALC 211 (H) 06/02/2017 0741      Many patients benefit from treatment even if their cholesterol is at goal.  Goal: Total Cholesterol (CHOL) less than 160  Goal:  Triglycerides (TRIG) less than 150  Goal:  HDL greater than 40  Goal:  LDL (LDLCALC) less than 100   BLOOD PRESSURE American Stroke Association blood pressure target is less that 120/80 mm/Hg  Your discharge blood pressure is:  BP: 129/61  Monitor your blood pressure  Limit your salt and alcohol intake  Many individuals will require more than one medication for high blood pressure  DIABETES (A1c is a blood sugar average for last 3 months) Goal HGBA1c is under 7% (HBGA1c is blood sugar average for last 3 months)  Diabetes:     Lab Results  Component Value Date   HGBA1C 9.5 (H) 06/02/2017     Your HGBA1c can be lowered with medications, healthy diet, and exercise.  Check your blood sugar as directed by your physician  Call your physician if you experience unexplained or low blood sugars.  PHYSICAL ACTIVITY/REHABILITATION Goal  is 30 minutes at least 4 days per week  Activity: No driving, Therapies: see above Return to work: N/A  Activity decreases your risk of heart attack and stroke and makes your heart stronger.  It helps control your weight and blood pressure; helps you relax and can improve your mood.  Participate in a regular exercise program.  Talk with your doctor about the best form of exercise for you (dancing, walking, swimming, cycling).  DIET/WEIGHT Goal is to maintain a healthy weight  Your discharge diet is: DIET DYS 2 Room service appropriate? Yes; Fluid consistency: Thin  liquids Your height is:  Height: 5\' 4"  (162.6 cm) Your current weight is: Weight: 78.1 kg (172 lb 4.5 oz) Your Body Mass Index (BMI) is:  BMI (Calculated): 29.56  Following the type of diet specifically designed for you will help prevent another stroke.  Your goal weight is:  145 lbs  Your goal Body  Mass Index (BMI) is 19-24.  Healthy food habits can help reduce 3 risk factors for stroke:  High cholesterol, hypertension, and excess weight.  RESOURCES Stroke/Support Group:  Call [phone]   STROKE EDUCATION PROVIDED/REVIEWED AND GIVEN TO PATIENT Stroke warning signs and symptoms How to activate emergency medical system (call 911). Medications prescribed at discharge. Need for follow-up after discharge. Personal risk factors for stroke. Pneumonia vaccine given:  Flu vaccine given:  My questions have been answered, the writing is legible, and I understand these instructions.  I will adhere to these goals & educational materials that have been provided to me after my discharge from the hospital.     My questions have been answered and I understand these instructions. I will adhere to these goals and the provided educational materials after my discharge from the hospital.  Patient/Caregiver Signature _______________________________ Date __________  Clinician Signature _______________________________________ Date __________  Please bring this form and your medication list with you to all your follow-up doctor's appointments.

## 2017-07-08 NOTE — Progress Notes (Signed)
Social Work  Discharge Note  The overall goal for the admission was met for:   Discharge location: Yes - home with family to provide 24/7 assistance  Length of Stay: Yes - 20 days  Discharge activity level: Yes - min assist overall  Home/community participation: Yes  Services provided included: MD, RD, PT, OT, SLP, RN, TR, Pharmacy, [place]: [facility] Medicare  Follow-up services arranged: Home Health: RN, PT, OT, ST via [facility], DME: transport w/c, bari droparm commode, rolling walker and tub bench via [place] and Patient/Family has no preference for HH/DME agencies  Comments (or additional information):  Patient/Family verbalized understanding of follow-up arrangements: Yes  Individual responsible for coordination of the follow-up plan: pt/spouse  Confirmed correct DME delivered: [name] 07/08/2017    [name]

## 2017-07-09 ENCOUNTER — Telehealth: Payer: Self-pay | Admitting: *Deleted

## 2017-07-09 NOTE — Telephone Encounter (Signed)
Transitional Care call-who you talked with daughter [name]    1. Are you/is patient experiencing any problems since coming home? Are there any questions regarding any aspect of care? They were not given the syringe equipment to flush out peg tube.  She is going to go by medical supply store and see if they can get one.  I also advixed when [facility] contacts them to let them know as well. (they were taught how to flush just not given the syringe) 2. Are there any questions regarding medications administration/dosing? Are meds being taken as prescribed? Patient should review meds with caller to confirm. they have all medications 3. Have there been any falls?No 4. Has Home Health been to the house and/or have they contacted you? If not, have you tried to contact them? Can we help you contact them? They have not made contact. She just got home from hospital yesterday pm. I advised they should call today or tomorrow at the latest. 5. Are bowels and bladder emptying properly? Are there any unexpected incontinence issues? If applicable, is patient following bowel/bladder programs? No 6. Any fevers, problems with breathing, unexpected pain? She is having some pain but no fevers or breathing difficulties. 7. Are there any skin problems or new areas of breakdown? NO 8. Has the patient/family member arranged specialty MD follow up (ie cardiology/neurology/renal/surgical/etc)?  Can we help arrange? I have given return appt with Dr Letta Pate 9. Does the patient need any other services or support that we can help arrange? NO 10. Are caregivers following through as expected in assisting the patient? YES 11. Has the patient quit smoking, drinking alcohol, or using drugs as recommended? N/A  Appointment Tuesday 07/20/17 @ 11:00 arrive by 10:30 to see Dr Letta Pate Address reviewed. [place]

## 2017-07-13 ENCOUNTER — Encounter: Payer: Self-pay | Admitting: Neurology

## 2017-07-13 ENCOUNTER — Ambulatory Visit (INDEPENDENT_AMBULATORY_CARE_PROVIDER_SITE_OTHER): Payer: Medicare Other | Admitting: Neurology

## 2017-07-13 VITALS — HR 80 | Ht 64.0 in | Wt 172.0 lb

## 2017-07-13 DIAGNOSIS — I63413 Cerebral infarction due to embolism of bilateral middle cerebral arteries: Secondary | ICD-10-CM | POA: Diagnosis not present

## 2017-07-13 NOTE — Progress Notes (Signed)
[facility]    Provider:  Dr Jaynee Eagles Referring Provider: [name], MD Primary Care Physician:  [name], MD  CC:  Stroke  HPI:  [name] is a 66 y.o. female here as a referral from Dr. Felipa Eth for stroke. She has a past medical history of chronic refractory headaches, pseudotumor cerebri status post lumboperotoneal shunt, peripheral neuropathy, hypertension, uncontrolled hypercholesterolemia(last ldl 211), uncontrolled diabetes(Hgba1c last 9.5). Review of records shows that patient was sent to the emergency room mid-August for right hand grip strength weakness. MRI showed acute and subacute infarcts in the left hemisphere in the left MCA distribution raising possibility of embolic source. CTA of the head and neck showed a large amount of noncalcified plaque in the proximal left internal carotid artery causing 60-65% stenosis and this was diagnosed as the likely etiology of emboli. Vascular was consulted for possible left carotid endarterectomy. A cerebral angiogram was ordered. During the procedure, patient had a sudden decline in mental status, became unresponsive, had roving eye movements, dilated and reactive pupils, bilateral upper extremity tonic posturing, diaphoresis taken immediately for stat CT of the head and CT head and neck. There were multiple thrombi in the bilateral M1 and proximal M2 segments bilaterally and emergent mechanical thrombectomy was performed. Patient status post complete revascularization of bilateral M1 segments with reperfusion bilaterally. EEG showed mild generalized cerebral dysfunction, intermittent arm twitching was not associated with any epileptiform activity. Repeat MRI showed numerous acute new supra-and infratentorial infarct spanning multiple vascular territories consistent with embolic phenomena  She is scheduled for OT/PT/Speech at home.   She was trached, and was sent to inpatient rehabilitation at the end of August.  She had intensive physical therapy, speech therapy and occupational therapy. Topamax was added to help manage persistent headache and titrated upwards without side effects. Her blood pressure was managed. She did require PEG placement.   Patient is here with daughter, is understandably distressed and both are crying. Reviewed imaging with patient, future plan including dual antiplatelet therapy for 3 months and then continuing with Plavix alone. She needs to strictly manage her hyperlipidemia and she was asked to discuss treatment with her primary care physician. Patient is continuing physical therapy, occupational therapy and speech therapy. Tracheostomy and PEG have been removed. Reassured patient that she can she see improvements even upwards of 6 months to a year after stroke and encouraged her to continue working hard in therapy.   Review of Systems: Patient complains of symptoms per HPI as well as the following symptoms: depression, anxiety, weakness, fatigue, nausea, headache. Pertinent negatives and positives per HPI. All others negative.   Social History   Social History  . Marital status: Married    Spouse name: Simona Huh  . Number of children: 2  . Years of education: 16   Occupational History  . Not on file.   Social History Main Topics  . Smoking status: Never Smoker  . Smokeless tobacco: Never Used  . Alcohol use No  . Drug use: No  . Sexual activity: Not on file   Other Topics Concern  . Not on file   Social History Narrative   Lives at home with husband and daughter   Caffeine use: coke daily       Family History  Problem Relation Age of Onset  . Heart disease Father   . Migraines Neg Hx     Past Medical History:  Diagnosis Date  . Anemia    takes iron supplement  . Anesthesia  complication    disorientation due to pseudotumor  . Diabetes mellitus    IDDM  . Family history of anesthesia complication 51 yrs ago   brother stopped breathing for a minute or  two  . GERD (gastroesophageal reflux disease)   . Headache(784.0)    due to pseudotumor; daily headache  . Hypercholesteremia    unable to tolerate statins  . Hypertension    under control with meds., has been on med. x 5 yr.  . Peripheral neuropathy   . PONV (postoperative nausea and vomiting)   . 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 Seizures ([place])    due to shunt failure; last seizure 2007  . Synovitis of ankle 04/2013   left  . Wears dentures    full    Past Surgical History:  Procedure Laterality Date  . ANKLE ARTHROSCOPY Left 05/18/2013   Procedure: LEFT ANKLE ARTHROSCOPY WITH DEBRIDEMENT,  SUBTALAR OPEN DEBRIDEMENT ;  Surgeon: Wylene Simmer, MD;  Location: [facility];  Service: Orthopedics;  Laterality: Left;  . APPENDECTOMY     as a child  . BALLOON DILATION N/A 07/04/2013   Procedure: BALLOON DILATION;  Surgeon: Garlan Fair, MD;  Location: [facility];  Service: Endoscopy;  Laterality: N/A;  . BLADDER SUSPENSION    . BREAST LUMPECTOMY W/ NEEDLE LOCALIZATION Left 05/22/2011  . CAROTID ANGIOGRAPHY Bilateral 06/03/2017   Procedure: Bilateral Carotid Angiography;  Surgeon: Conrad , MD;  Location: [facility];  Service: Cardiovascular;  Laterality: Bilateral;  . COLONOSCOPY WITH PROPOFOL N/A 05/14/2014   Procedure: COLONOSCOPY WITH PROPOFOL;  Surgeon: Garlan Fair, MD;  Location: [facility];  Service: Endoscopy;  Laterality: N/A;  . ESOPHAGEAL DILATION  06/23/2006; 08/05/2004  . ESOPHAGOGASTRODUODENOSCOPY N/A 07/04/2013   Procedure: ESOPHAGOGASTRODUODENOSCOPY (EGD);  Surgeon: Garlan Fair, MD;  Location: [facility];  Service: Endoscopy;  Laterality: N/A;  . ESOPHAGOGASTRODUODENOSCOPY (EGD) WITH PROPOFOL N/A 05/14/2014   Procedure: ESOPHAGOGASTRODUODENOSCOPY (EGD) WITH PROPOFOL;  Surgeon: Garlan Fair, MD;  Location: [facility];  Service: Endoscopy;  Laterality: N/A;  . IR ANGIO VERTEBRAL SEL SUBCLAVIAN INNOMINATE UNI R  MOD SED  06/03/2017  . IR GASTROSTOMY TUBE MOD SED  06/25/2017  . IR PERCUTANEOUS ART THROMBECTOMY/INFUSION INTRACRANIAL INC DIAG ANGIO  06/03/2017  . IR PERCUTANEOUS ART THROMBECTOMY/INFUSION INTRACRANIAL INC DIAG ANGIO  06/03/2017  . LUMBAR PERITONEAL SHUNT     x 2  . OVARIAN CYST REMOVAL     age 24  . RADIOLOGY WITH ANESTHESIA N/A 06/03/2017   Procedure: RADIOLOGY WITH ANESTHESIA;  Surgeon: Luanne Bras, MD;  Location: [facility];  Service: Radiology;  Laterality: N/A;  . SHUNT REVISION  2007  . TOTAL ABDOMINAL HYSTERECTOMY W/ BILATERAL SALPINGOOPHORECTOMY  1994   age 73s  . WRIST SURGERY Left 2014    [name]    Current Outpatient Prescriptions  Medication Sig Dispense Refill  . aspirin 325 MG tablet Take 1 tablet (325 mg total) by mouth daily.    . cloNIDine (CATAPRES - DOSED IN MG/24 HR) 0.2 mg/24hr patch Place 1 patch (0.2 mg total) onto the skin once a week. On wednesdays 4 patch 0  . clopidogrel (PLAVIX) 75 MG tablet Take 1 tablet (75 mg total) by mouth daily with breakfast. 30 tablet 0  . diclofenac sodium (VOLTAREN) 1 % GEL Apply 2 application topically 4 (four) times daily as needed (pain).     Marland Kitchen docusate sodium (COLACE) 100 MG capsule Take 1 capsule (100 mg  total) by mouth daily. 30 capsule 0  . esomeprazole (NEXIUM) 40 MG capsule Take 40 mg by mouth daily as needed (acid reflux). Acid reflux    . estropipate (OGEN) 1.5 MG tablet Take 0.75 mg by mouth daily.     . insulin glargine (LANTUS) 100 unit/mL SOPN Inject 0.2 mLs (20 Units total) into the skin at bedtime. 15 mL 11  . metoprolol tartrate (LOPRESSOR) 100 MG tablet Take 1 tablet (100 mg total) by mouth 2 (two) times daily. 60 tablet 1  . PRESCRIPTION MEDICATION Take 20 mg by mouth See admin instructions. Domperidone 10 mg from [place]: Take 2 tablets (20 mg) by mouth three times daily with meals - for diabetic gastroparesis    . prochlorperazine (COMPAZINE) 5 MG tablet Take 1 tablet (5 mg total) by mouth every 8 (eight)  hours as needed for nausea or vomiting. Have been taking it with meals--wean as able 90 tablet 0  . topiramate (TOPAMAX) 50 MG tablet Take 1 tablet (50 mg total) by mouth 2 (two) times daily. 60 tablet 0  . Water For Irrigation, Sterile (FREE WATER) SOLN Take 200 mLs by mouth every 8 (eight) hours. Use filtered water    . acetaZOLAMIDE (DIAMOX) 250 MG tablet Take 1 tablet (250 mg total) by mouth 3 (three) times daily. 90 tablet 12   No current facility-administered medications for this visit.     Allergies as of 07/13/2017 - Review Complete 07/13/2017  Allergen Reaction Noted  . Carbamazepine Hives, Shortness Of Breath, and Other (See Comments) 12/29/2010  . Penicillins Anaphylaxis and Other (See Comments) 05/05/2011  . Statins Other (See Comments) 05/05/2011  . Tricyclic antidepressants Other (See Comments) 05/05/2011  . Atorvastatin Other (See Comments) 12/29/2010  . Ezetimibe Other (See Comments) 12/29/2010  . Nortriptyline Other (See Comments) 06/28/2014  . Quinapril hcl Other (See Comments) 12/29/2010  . Rifampin Diarrhea 01/22/2012  . Metoclopramide Nausea And Vomiting and Rash 12/29/2010    Vitals: Pulse 80   Ht 5\' 4"  (1.626 m)   Wt 172 lb (78 kg)   BMI 29.52 kg/m  Last Weight:  Wt Readings from Last 1 Encounters:  07/13/17 172 lb (78 kg)   Last Height:   Ht Readings from Last 1 Encounters:  07/13/17 5\' 4"  (1.626 m)   Physical exam: Exam: Gen: crying, conversant, well nourised, obese, well groomed                     CV: RRR, no MRG. No Carotid Bruits. No peripheral edema, warm, nontender Eyes: Conjunctivae clear without exudates or hemorrhage  Neuro: Detailed Neurologic Exam  Speech:    Mild dysarthria Cognition:    The patient is oriented to person, place, and time;     recent and remote memory intact;   Cranial Nerves:    The pupils are equal, round, and reactive to light. right homonymous hemianopia . Extraocular movements are intact. Trigeminal  sensation is intact and the muscles of mastication are normal. Mild left facial droopThe palate elevates in the midline. Hearing intact. Voice is normal. Shoulder shrug is normal. The tongue has normal motion without fasciculations.   Motor Observation:    no involuntary movements noted.    Strength:    Patient has right hemiparesis 3+ to 4/5 throughout on the right, left is essential normal        Assessment/Plan:   66 y.o. female here as a referral from Dr. Felipa Eth for stroke. She has a past medical history of  chronic refractory headaches, pseudotumor cerebri status post lumboperotoneal shunt, peripheral neuropathy, hypertension, hypercholesterolemia, uncontrolled diabetes. Patient was admitted to [facility] in mid August due to new onset right hand weakness and acute embolic strokes seen on MRI. Cerebral angiogram was performed to evaluate left internal carotid artery stenosis and possible carotid endarterectomy, during cerebral angiogram patient had acute mental status decline found to have bilateral M1 thrombus status post mechanical thrombectomy. Follow-up MRI showed numerous acute new supratentorial and infratentorial infarcts bilaterally spanning multiple vascular territories embolic phenomena. Follow-up vascular studies showed left ICA likely less than 50% stenosis and no intervention needed at this time but recommended dual antiplatelet therapy.   I had a long d/w patient about her recent stroke, risk for recurrent stroke/TIAs, personally independently reviewed imaging studies and stroke evaluation results and answered questions.Continue Plavix and ASA for secondary stroke prevention for 3 months and then Plavix alone, and maintain strict control of hypertension with blood pressure goal below 130/90, diabetes with hemoglobin A1c goal below 7% and lipids with LDL cholesterol goal below 70 mg/dL.Patient has h/o statin intolerance hence recommend new PCSK9 inhibitor like Praluent. Recommend  she see her primary MD to get prescription. I also advised the patient to eat a healthy diet with plenty of whole grains, cereals, fruits and vegetables, exercise regularly and maintain ideal body weight.  Continue physical therapy, occupational therapy and speech therapy.  Continue with Dr.Kierstein is in physical rehabilitation   .Followup with me in 2 months or call earlier if necessary.    Sarina Ill, MD  [facility] [address] [place], [place] [address]  Phone [phone] Fax [phone]  A total of 30 minutes was spent face-to-face with this patient. Over half this time was spent on counseling patient on the embolic stroke diagnosis and different diagnostic and therapeutic options available.   greater than 50% of the visit was spent in counseling and coordination of care  Even stable problems: CHF:  Remains stable today  .Diagnostic results, impressions, or recommended diagnostic studies, .Prognosis, .Risks and benefits of management (treatment) options, .Instructions for management (treatment) or follow-up, .Importance of compliance with chosen management (treatment) options, .Risk factor reduction, .Patient and family education

## 2017-07-16 ENCOUNTER — Telehealth: Payer: Self-pay | Admitting: Neurology

## 2017-07-16 MED ORDER — ACETAZOLAMIDE 250 MG PO TABS: 250.0000 mg | ORAL_TABLET | Freq: Three times a day (TID) | ORAL | 12 refills | Status: DC

## 2017-07-16 NOTE — Addendum Note (Signed)
Addended by: Minna Antis on: 07/16/2017 12:39 PM   Modules accepted: Orders

## 2017-07-16 NOTE — Telephone Encounter (Signed)
Diamox reordered per Dr Jaynee Eagles.

## 2017-07-16 NOTE — Telephone Encounter (Signed)
Pt daughter (on [place]) is asking for a call re: the Diamox she and pt were told would be called into the pharmacy, daughter has checked with them and has not been received.  Please call

## 2017-07-20 ENCOUNTER — Encounter: Payer: Medicare Other | Attending: Physical Medicine & Rehabilitation

## 2017-07-20 ENCOUNTER — Encounter: Payer: Self-pay | Admitting: Physical Medicine & Rehabilitation

## 2017-07-20 ENCOUNTER — Ambulatory Visit (HOSPITAL_BASED_OUTPATIENT_CLINIC_OR_DEPARTMENT_OTHER): Payer: Medicare Other | Admitting: Physical Medicine & Rehabilitation

## 2017-07-20 VITALS — BP 150/91 | HR 129

## 2017-07-20 DIAGNOSIS — I69351 Hemiplegia and hemiparesis following cerebral infarction affecting right dominant side: Secondary | ICD-10-CM | POA: Diagnosis not present

## 2017-07-20 DIAGNOSIS — E7800 Pure hypercholesterolemia, unspecified: Secondary | ICD-10-CM | POA: Insufficient documentation

## 2017-07-20 DIAGNOSIS — R569 Unspecified convulsions: Secondary | ICD-10-CM | POA: Insufficient documentation

## 2017-07-20 DIAGNOSIS — I10 Essential (primary) hypertension: Secondary | ICD-10-CM | POA: Insufficient documentation

## 2017-07-20 DIAGNOSIS — Z8249 Family history of ischemic heart disease and other diseases of the circulatory system: Secondary | ICD-10-CM | POA: Insufficient documentation

## 2017-07-20 DIAGNOSIS — Z9889 Other specified postprocedural states: Secondary | ICD-10-CM | POA: Diagnosis not present

## 2017-07-20 DIAGNOSIS — K219 Gastro-esophageal reflux disease without esophagitis: Secondary | ICD-10-CM | POA: Insufficient documentation

## 2017-07-20 DIAGNOSIS — I69391 Dysphagia following cerebral infarction: Secondary | ICD-10-CM | POA: Diagnosis not present

## 2017-07-20 DIAGNOSIS — G932 Benign intracranial hypertension: Secondary | ICD-10-CM | POA: Diagnosis not present

## 2017-07-20 DIAGNOSIS — Z90710 Acquired absence of both cervix and uterus: Secondary | ICD-10-CM | POA: Diagnosis not present

## 2017-07-20 DIAGNOSIS — Z90722 Acquired absence of ovaries, bilateral: Secondary | ICD-10-CM | POA: Insufficient documentation

## 2017-07-20 DIAGNOSIS — E119 Type 2 diabetes mellitus without complications: Secondary | ICD-10-CM | POA: Diagnosis not present

## 2017-07-20 NOTE — Progress Notes (Signed)
Subjective:  Transitional care visit after rehabilitation stay. [place] , Attending [place]  Patient ID: YISELLE BABICH, female    DOB: 22-Mar-1951, 66 y.o.   MRN: [id number] 66 y.o. 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 She was sent to the ED for full work up and CTA head/neck done revealing large non-calcified plaque in proximal L-ICA causing 60-65% stenosis and likely source of emboli.   She underwent cerebral angiogram for work up on 8/16 revealing < 50% L-ICA stenosis and during the procedure became unresponsive with bilateral UE tonic posturing and left sided weakness. She  was taken to IR for endovascualr revascularization of B-MCA occlusions with TICI reperfusion bilaterally. Dr. Erlinda Hong recommended DAPT x 3 months followed by Plavix alone.Follow up MRI brain revealed numerous acute new supra and infratentorial infarcts involving B-MCA, and bilateral PCA- no hemorrhage. Hospital course course significant difficulty with vent wean requiring tracheostomy, MSSA bronchitis v/s PNA, severe dysphagia--NPO as well as drop in H/H requiring transfusion with one unit PRBC. She was showing improvement in participation and activity tolerance PEG placement by Dr. Laurence Ferrari (IVR) on 06/25/17  Admit date: 06/18/2017 Discharge date: 07/09/2017  HPI Eating less than 50% meals Drinks 3 cans of boost ensure per day Drinks 2 x 16oz btl of ginger ale per day [place] twice within first couple days of going home. Daughter is helping with dressing and bathing Stands with assist  No coughing or SOB at home, Still has some problems with her swallowing Seen by Dr Felipa Eth last THursday No change in meds.    [facility]. However, nurse came out last week, no therapist scheduled  to come out until this week.  Pain Inventory Average Pain 0 Pain Right Now 0 My pain is na  In the last 24 hours, has pain interfered with the following? General activity 0 Relation with others 0 Enjoyment of life 0 What TIME of day is your pain at its worst? na Sleep (in general) Poor  Pain is worse with: na Pain improves with: na Relief from Meds: na  Mobility use a walker ability to climb steps?  no do you drive?  no use a wheelchair  Function disabled: date disabled 1999  Neuro/Psych tremor trouble walking confusion anxiety loss of taste or smell  Prior Studies Any changes since last visit?  no  Physicians involved in your care Any changes since last visit?  no   Family History  Problem Relation Age of Onset  . Heart disease Father   . Migraines Neg Hx    Social History   Social History  . Marital status: Married    Spouse name: Simona Huh  . Number of children: 2  . Years of education: 27   Social History Main Topics  . Smoking status: Never Smoker  . Smokeless tobacco: Never Used  . Alcohol use No  . Drug use: No  . Sexual activity: Not on file   Other Topics Concern  . Not on file   Social History Narrative   Lives at home with husband and daughter   Caffeine use: coke daily      Past Surgical History:  Procedure Laterality Date  . ANKLE ARTHROSCOPY Left 05/18/2013   Procedure: LEFT ANKLE ARTHROSCOPY WITH DEBRIDEMENT,  SUBTALAR OPEN DEBRIDEMENT ;  Surgeon:  Wylene Simmer, MD;  Location: [facility];  Service: Orthopedics;  Laterality: Left;  . APPENDECTOMY     as a child  . BALLOON DILATION N/A 07/04/2013   Procedure: BALLOON DILATION;  Surgeon: Garlan Fair, MD;  Location: [facility];  Service: Endoscopy;  Laterality: N/A;  . BLADDER SUSPENSION    . BREAST LUMPECTOMY W/ NEEDLE LOCALIZATION Left 05/22/2011  . CAROTID ANGIOGRAPHY Bilateral 06/03/2017   Procedure: Bilateral Carotid Angiography;  Surgeon: Conrad Chappell, MD;   Location: [facility];  Service: Cardiovascular;  Laterality: Bilateral;  . COLONOSCOPY WITH PROPOFOL N/A 05/14/2014   Procedure: COLONOSCOPY WITH PROPOFOL;  Surgeon: Garlan Fair, MD;  [facility];  Service: Endoscopy;  Laterality: N/A;  . ESOPHAGEAL DILATION  06/23/2006; 08/05/2004  . ESOPHAGOGASTRODUODENOSCOPY N/A 07/04/2013   Procedure: ESOPHAGOGASTRODUODENOSCOPY (EGD);  Surgeon: Garlan Fair, MD;  Location: [facility];  Service: Endoscopy;  Laterality: N/A;  . ESOPHAGOGASTRODUODENOSCOPY (EGD) WITH PROPOFOL N/A 05/14/2014   Procedure: ESOPHAGOGASTRODUODENOSCOPY (EGD) WITH PROPOFOL;  Surgeon: Garlan Fair, MD;  [facility];  Service: Endoscopy;  Laterality: N/A;  . IR ANGIO VERTEBRAL SEL SUBCLAVIAN INNOMINATE UNI R MOD SED  06/03/2017  . IR GASTROSTOMY TUBE MOD SED  06/25/2017  . IR PERCUTANEOUS ART THROMBECTOMY/INFUSION INTRACRANIAL INC DIAG ANGIO  06/03/2017  . IR PERCUTANEOUS ART THROMBECTOMY/INFUSION INTRACRANIAL INC DIAG ANGIO  06/03/2017  . LUMBAR PERITONEAL SHUNT     x 2  . OVARIAN CYST REMOVAL     age 78  . RADIOLOGY WITH ANESTHESIA N/A 06/03/2017   Procedure: RADIOLOGY WITH ANESTHESIA;  Surgeon: Luanne Bras, MD;  Location: [facility];  Service: Radiology;  Laterality: N/A;  . SHUNT REVISION  2007  . TOTAL ABDOMINAL HYSTERECTOMY W/ BILATERAL SALPINGOOPHORECTOMY  1994   age 52s  . WRIST SURGERY Left 2014    [name]   Past Medical History:  Diagnosis Date  . Anemia    takes iron supplement  . Anesthesia complication    disorientation due to pseudotumor  . Diabetes mellitus    IDDM  . Family history of anesthesia complication 14 yrs ago   brother stopped breathing for a minute or two  . GERD (gastroesophageal reflux disease)   . Headache(784.0)    due to pseudotumor; daily headache  . Hypercholesteremia    unable to tolerate statins  . Hypertension    under control with meds., has been on med. x 5 yr.  . Peripheral neuropathy   .  PONV (postoperative nausea and vomiting)   . 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 Seizures ([place])    due to shunt failure; last seizure 2007  . Synovitis of ankle 04/2013   left  . Wears dentures    full   There were no vitals taken for this visit.  Opioid Risk Score:   Fall Risk Score:  `1  Depression screen PHQ 2/9  No flowsheet data found.   Review of Systems  Constitutional: Positive for appetite change.  HENT: Negative.   Eyes: Negative.   Respiratory: Negative.   Cardiovascular: Negative.   Gastrointestinal: Negative.   Endocrine: Negative.   Genitourinary: Negative.   Musculoskeletal: Negative.   Skin: Negative.   Allergic/Immunologic: Negative.   Neurological: Negative.   Hematological: Negative.   Psychiatric/Behavioral: Negative.   All other systems reviewed and are negative.      Objective:   Physical Exam  Constitutional: She is oriented to person, place, and time. She appears  well-developed and well-nourished. No distress.  HENT:  Head: Normocephalic and atraumatic.  Eyes: Pupils are equal, round, and reactive to light. Conjunctivae and EOM are normal. Right eye exhibits no discharge. Left eye exhibits no discharge. No scleral icterus.  Neck: Normal range of motion. No JVD present. No thyromegaly present.  Cardiovascular: Normal rate, regular rhythm and normal heart sounds.  Exam reveals no friction rub.   No murmur heard. Pulmonary/Chest: Effort normal and breath sounds normal. No stridor. No respiratory distress. She has no wheezes.  Abdominal: Soft. Bowel sounds are normal. She exhibits no distension. There is no tenderness.  Neurological: She is alert and oriented to person, place, and time.  Skin: She is not diaphoretic.  Psychiatric: She has a normal mood and affect.  Nursing note and vitals reviewed.   Motor strength is 4 minus in the right deltoid, biceps, triceps, grip, hip flexor, knee extensor, 3 minus at the right ankle  dorsiflexor, plantar flexion 4+/5 in the left deltoid, biceps, triceps, grip, 5/5 and left hip flexor, knee extensor, ankle dorsal flexor  Fine motor skills are reduced in the right upper limb with finger to thumb opposition. She has mild tremor bilateral hands.       Assessment & Plan:  1. Embolic infarcts, left parietal , occipital lobe, as well as deep white matter. Has resultant right upper extremity greater than lower extremity weakness. Her dysfunction, improved, she still has reported problems with swallowing, but not choking or aspirating. She is no longer using the gastrostomy tube. Pt has been able to take meds by mouth. Daughter is assisting. She flushes the G-tube. As long as her intake remained stable, she should be able to get the gastrostomy tube removed by interventional radiology on or after 08/08/2017. Continue Plavix and aspirin- neurology to determine duration, ? 3 months on dual antiplatelet agents, then down to clopidogrel only.  Continue home health PT, OT, speech, return to clinic one month, at that time I anticipate making a referral to outpatient PT, OT, speech  2. Pseudotumor cerebri according to the patient this is has been causing chronic headaches as well as some left upper extremity tremor Follow-up with Dr. Harlin Heys

## 2017-07-20 NOTE — Patient Instructions (Signed)
If you're appetite gets worse, you may need to postpone the removal of the gastrostomy tube.  We'll see you next month and address referral to outpatient PT, OT, speech

## 2017-07-21 ENCOUNTER — Telehealth (HOSPITAL_COMMUNITY): Payer: Self-pay

## 2017-07-21 NOTE — Telephone Encounter (Signed)
Called to schedule peg removal, left message for pt to return call. AW

## 2017-07-23 ENCOUNTER — Telehealth: Payer: Self-pay

## 2017-07-23 DIAGNOSIS — I63132 Cerebral infarction due to embolism of left carotid artery: Secondary | ICD-10-CM

## 2017-07-23 NOTE — Telephone Encounter (Signed)
[name] called for patient wanting a referral for outpatient PT. She stated that due to the construction going on at her house PT was not going to work there.

## 2017-07-23 NOTE — Telephone Encounter (Signed)
Please make referral for outpatient PT and OT at [facility] rehabilitation

## 2017-07-23 NOTE — Telephone Encounter (Deleted)
[name] called for patient wanting a referral for outpatient PT. She stated that due to the construction going on at her house PT was not going to work there.

## 2017-07-26 NOTE — Telephone Encounter (Signed)
Referral sent 

## 2017-07-27 ENCOUNTER — Telehealth (HOSPITAL_COMMUNITY): Payer: Self-pay

## 2017-07-27 NOTE — Telephone Encounter (Signed)
Called to schedule gtube removal, left message for pt to return call. AW

## 2017-08-02 ENCOUNTER — Telehealth: Payer: Self-pay | Admitting: Neurology

## 2017-08-02 DIAGNOSIS — I63413 Cerebral infarction due to embolism of bilateral middle cerebral arteries: Secondary | ICD-10-CM

## 2017-08-02 NOTE — Telephone Encounter (Signed)
Pt daughter(on [place]) calling re: a referral for physical therapy outside of pt home due to some construction work being done.  Pr daughter states she has tried numerous times and has not been able to reach Dr Sherre Poot, pt daughter asking to be called

## 2017-08-03 ENCOUNTER — Other Ambulatory Visit: Payer: Self-pay | Admitting: Physical Medicine & Rehabilitation

## 2017-08-03 DIAGNOSIS — I69391 Dysphagia following cerebral infarction: Secondary | ICD-10-CM

## 2017-08-03 DIAGNOSIS — I69351 Hemiplegia and hemiparesis following cerebral infarction affecting right dominant side: Secondary | ICD-10-CM

## 2017-08-04 ENCOUNTER — Telehealth: Payer: Self-pay | Admitting: Physical Medicine & Rehabilitation

## 2017-08-04 ENCOUNTER — Telehealth (HOSPITAL_COMMUNITY): Payer: Self-pay

## 2017-08-04 NOTE — Telephone Encounter (Signed)
Called to schedule f/u. Husband will talk to daughter and call back if they decide to schedule. AW

## 2017-08-04 NOTE — Telephone Encounter (Signed)
Received phone mail from daughter wants mom to get into PT outside home - told her referrals were placed [id number] by dr Letta Pate

## 2017-08-05 ENCOUNTER — Other Ambulatory Visit: Payer: Self-pay | Admitting: *Deleted

## 2017-08-05 DIAGNOSIS — I639 Cerebral infarction, unspecified: Secondary | ICD-10-CM

## 2017-08-05 NOTE — Telephone Encounter (Signed)
Called daughter [name] (on [place]) and made her aware that OP PT/OT orders were placed. She verbalized understanding and had no questions.

## 2017-08-12 ENCOUNTER — Other Ambulatory Visit (HOSPITAL_COMMUNITY): Payer: Self-pay | Admitting: Interventional Radiology

## 2017-08-12 DIAGNOSIS — I639 Cerebral infarction, unspecified: Secondary | ICD-10-CM

## 2017-08-16 ENCOUNTER — Ambulatory Visit: Payer: Medicare Other

## 2017-08-16 ENCOUNTER — Telehealth: Payer: Self-pay | Admitting: Occupational Therapy

## 2017-08-16 ENCOUNTER — Ambulatory Visit: Payer: Medicare Other | Attending: Neurology | Admitting: Occupational Therapy

## 2017-08-16 DIAGNOSIS — R2681 Unsteadiness on feet: Secondary | ICD-10-CM | POA: Diagnosis present

## 2017-08-16 DIAGNOSIS — M6281 Muscle weakness (generalized): Secondary | ICD-10-CM | POA: Diagnosis present

## 2017-08-16 DIAGNOSIS — I69318 Other symptoms and signs involving cognitive functions following cerebral infarction: Secondary | ICD-10-CM | POA: Diagnosis present

## 2017-08-16 DIAGNOSIS — R41842 Visuospatial deficit: Secondary | ICD-10-CM | POA: Diagnosis present

## 2017-08-16 DIAGNOSIS — R2689 Other abnormalities of gait and mobility: Secondary | ICD-10-CM | POA: Diagnosis present

## 2017-08-16 DIAGNOSIS — R29818 Other symptoms and signs involving the nervous system: Secondary | ICD-10-CM | POA: Diagnosis present

## 2017-08-16 DIAGNOSIS — I69351 Hemiplegia and hemiparesis following cerebral infarction affecting right dominant side: Secondary | ICD-10-CM | POA: Insufficient documentation

## 2017-08-16 DIAGNOSIS — R278 Other lack of coordination: Secondary | ICD-10-CM | POA: Insufficient documentation

## 2017-08-16 NOTE — Therapy (Signed)
[facility] [address] [place], [place], [address] Phone: [phone]   Fax:  [phone]  Occupational Therapy Evaluation  Patient Details  Name: [name] MRN: [id number] Date of Birth: 12-04-1950 Referring Provider: Dr. Alysia Penna  Encounter Date: 08/16/2017      OT End of Session - 08/16/17 2132    Visit Number 1   Number of Visits 17   Date for OT Re-Evaluation 10/15/17   Authorization Type [employer] Medicare, no visit limit/no auth; G-code needed   Authorization - Visit Number 1   Authorization - Number of Visits 10   OT Start Time [phone]   OT Stop Time 1017   OT Time Calculation (min) 44 min   Activity Tolerance Patient tolerated treatment well   Behavior During Therapy Agitated  easily frustrated with difficulty using RUE      Past Medical History:  Diagnosis Date  . Anemia    takes iron supplement  . Anesthesia complication    disorientation due to pseudotumor  . Diabetes mellitus    IDDM  . Family history of anesthesia complication 75 yrs ago   brother stopped breathing for a minute or two  . GERD (gastroesophageal reflux disease)   . Headache(784.0)    due to pseudotumor; daily headache  . Hypercholesteremia    unable to tolerate statins  . Hypertension    under control with meds., has been on med. x 5 yr.  . Peripheral neuropathy   . PONV (postoperative nausea and vomiting)   . 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 Seizures ([place])    due to shunt failure; last seizure 2007  . Synovitis of ankle 04/2013   left  . Wears dentures    full    Past Surgical History:  Procedure Laterality Date  . ANKLE ARTHROSCOPY Left 05/18/2013   Procedure: LEFT ANKLE ARTHROSCOPY WITH DEBRIDEMENT,  SUBTALAR OPEN DEBRIDEMENT ;  Surgeon: Wylene Simmer, MD;  Location: [facility];  Service: Orthopedics;  Laterality: Left;  . APPENDECTOMY     as a child  . BALLOON  DILATION N/A 07/04/2013   Procedure: BALLOON DILATION;  Surgeon: Garlan Fair, MD;  Location: [facility];  Service: Endoscopy;  Laterality: N/A;  . BLADDER SUSPENSION    . BREAST LUMPECTOMY W/ NEEDLE LOCALIZATION Left 05/22/2011  . CAROTID ANGIOGRAPHY Bilateral 06/03/2017   Procedure: Bilateral Carotid Angiography;  Surgeon: Conrad Lake Summerset, MD;  Location: [facility];  Service: Cardiovascular;  Laterality: Bilateral;  . COLONOSCOPY WITH PROPOFOL N/A 05/14/2014   Procedure: COLONOSCOPY WITH PROPOFOL;  Surgeon: Garlan Fair, MD;  Location: [facility];  Service: Endoscopy;  Laterality: N/A;  . ESOPHAGEAL DILATION  06/23/2006; 08/05/2004  . ESOPHAGOGASTRODUODENOSCOPY N/A 07/04/2013   Procedure: ESOPHAGOGASTRODUODENOSCOPY (EGD);  Surgeon: Garlan Fair, MD;  Location: [facility];  Service: Endoscopy;  Laterality: N/A;  . ESOPHAGOGASTRODUODENOSCOPY (EGD) WITH PROPOFOL N/A 05/14/2014   Procedure: ESOPHAGOGASTRODUODENOSCOPY (EGD) WITH PROPOFOL;  Surgeon: Garlan Fair, MD;  Location: [facility];  Service: Endoscopy;  Laterality: N/A;  . IR ANGIO VERTEBRAL SEL SUBCLAVIAN INNOMINATE UNI R MOD SED  06/03/2017  . IR GASTROSTOMY TUBE MOD SED  06/25/2017  . IR PERCUTANEOUS ART THROMBECTOMY/INFUSION INTRACRANIAL INC DIAG ANGIO  06/03/2017  . IR PERCUTANEOUS ART THROMBECTOMY/INFUSION INTRACRANIAL INC DIAG ANGIO  06/03/2017  . LUMBAR PERITONEAL SHUNT     x 2  . OVARIAN CYST REMOVAL     age 66  . RADIOLOGY  WITH ANESTHESIA N/A 06/03/2017   Procedure: RADIOLOGY WITH ANESTHESIA;  Surgeon: Luanne Bras, MD;  Location: [facility];  Service: Radiology;  Laterality: N/A;  . SHUNT REVISION  2007  . TOTAL ABDOMINAL HYSTERECTOMY W/ BILATERAL SALPINGOOPHORECTOMY  1994   age 89s  . WRIST SURGERY Left 2014    [name]    There were no vitals filed for this visit.      Subjective Assessment - 08/16/17 0940    Subjective  PEG tube will be removed Friday per dtr   Patient is accompained by: Family  member  daughter   Pertinent History recent L parietal cortex and occipital lobe infarcts per 05/31/17 MRI; 8/16 bilateral MCA infarcts during angiogram procedure;  pseudotumor cerebri with shunt; peripheral vision loss; HTN; diabetic peripheral neuropathy; anxiety; hearing loss; DM; hx of seizure; PEG tube (anticipated removal 08/20/17)   Limitations swallowing precautions:  diced up, crushing pills per dtr; fall risk; cognitive deficits; hard of hearing; peripheral vision loss; hx of seizure   Patient Stated Goals "I'm not sure"  (ADLs and RUE functional use when prompted)   Currently in Pain? --  PEG tube uncomfortable           [facility] OT Assessment - 08/16/17 0001      Assessment   Diagnosis CVA  multiple 05/2017   Referring Provider Dr. Alysia Penna   Onset Date 05/27/17  06/03/17   Prior Therapy CIR, Home health speech therapy only (ended last week)     Precautions   Precautions Fall     Balance Screen   Has the patient fallen in the past 6 months Yes   How many times? 1  traveling for wedding, slipped off chair     Home  Environment   Family/patient expects to be discharged to: Private residence   Type of Home Mobile home   Lives With Spouse;Daughter  24 hr. supervision     Prior Function   Level of Independence Independent with basic ADLs  stopped driving 1.5 years ago due to visual deficits     ADL   Eating/Feeding --  using L hand, attempts to use R hand (minimal)   Grooming Brushing hair  using R hand some, more L hand   Upper Body Bathing Maximal assistance   Lower Body Bathing Maximal assistance   Upper Body Dressing Moderate assistance   Lower Body Dressing Moderate assistance   Toilet Transfer --  BSC with min A   Toileting - Clothing Manipulation Moderate assistance   Toileting -  Hygiene Modified Independent   Tub/Shower Transfer Minimal assistance   Actuary bench   Transfers/Ambulation Related to ADL's min A      IADL   Prior Level of Function Light Housekeeping did most cleaning tasks   Light Housekeeping --  not performing   Prior Level of Function Meal Prep shared with husband   Meal Prep --  not performing   Prior Level of Function Community Mobility quit driving 9.5KDT ago due to visual deficits   Education officer, environmental on family or friends for transportation   Medication Management Is not capable of dispensing or managing own medication     Mobility   Mobility Status Needs assist   Mobility Status Comments Ambulating to [facility] with CGA without device, and standing with min A from sitting, ambulates with hand-held assist or with RW     Written Expression   Dominant Hand Right     Vision - History  Baseline Vision --  decr peripheral vision   Visual History Cataracts  bilateral surgery with complications   Patient Visual Report Peripheral vision impairment  incr blurriness from CVA per pt report     Vision Assessment   Vision Assessment Vision impaired  _ to be further tested in functional context     Cognition   Overall Cognitive Status Impaired/Different from baseline  to be assessed further in functional context prn   Area of Impairment Memory;Awareness   Memory Decreased short-term memory   Awareness Intellectual   Behaviors Poor frustration tolerance  anxious      Sensation   Light Touch Not tested     Coordination   9 Hole Peg Test Right;Left   Right 9 Hole Peg Test 25min 47sec   Left 9 Hole Peg Test 39.38   Box and Blocks R-31blocks, L-41blocks     Tone   Assessment Location Right Upper Extremity;Left Upper Extremity     ROM / Strength   AROM / PROM / Strength AROM;Strength     AROM   Overall AROM Comments approx 90% RUE shoulder flex, ER, finger flex.  unable to to perform finger opposition.   LUE grossly WFL     Strength   Overall Strength Deficits   Overall Strength Comments RUE proximal strength grossly 3 to 3+/5, LUE grossly 4+/5     Hand Function    Right Hand Grip (lbs) 7   Left Hand Grip (lbs) 27     RUE Tone   RUE Tone --  min-mod      LUE Tone   LUE Tone Within Functional Limits                           OT Short Term Goals - 08/16/17 2147      OT SHORT TERM GOAL #1   Title Pt/caregiver will be independent with initial HEP--check STGs 09/14/17   Time 4   Period Weeks   Status New     OT SHORT TERM GOAL #2   Title Pt will perform bathing with mod A.   Time 4   Period Weeks   Status New     OT SHORT TERM GOAL #3   Title Pt will perform dressing with min A.   Time 4   Period Weeks   Status New     OT SHORT TERM GOAL #4   Title Pt will improve R grip strength by at least 8lbs to assist with pulling up pants, opening containers.   Baseline 7lbs   Time 4   Period Weeks   Status New     OT SHORT TERM GOAL #5   Title Pt will improve RUE coordination and functional reaching as shown by improving score on box and blocks test by at least 6 blocks.   Baseline 31blocks   Time 4   Period Weeks   Status New     OT SHORT TERM GOAL #6   Title Pt will be able to eat with dominant RUE at least 50% of the time.   Time 4   Period Weeks   Status New           OT Long Term Goals - 08/16/17 2151      OT LONG TERM GOAL #1   Title Pt/caregiver will be independent with initial HEP--check LTGs 10/15/17   Time 8   Period Weeks   Status New  OT LONG TERM GOAL #2   Title Pt will perform bathing with min A.   Time 8   Period Weeks   Status New     OT LONG TERM GOAL #3   Title Pt will perform dressing mod I.   Time 8   Period Weeks   Status New     OT LONG TERM GOAL #4   Title Pt will improve R grip strength by at least 12lbs to assist with pulling up pants, opening containers.   Baseline 7lbs   Time 8   Period Weeks   Status New     OT LONG TERM GOAL #5   Title Pt will improve RUE coordination for ADLs as shown by completing 9-hole peg test in 100sec or less.   Baseline 69min  47sec   Time 8   Period Weeks   Status New     OT LONG TERM GOAL #6   Title Pt will use dominant RUE to eat at least 75% of the time.   Time 8   Period Weeks   Status New               Plan - 2017-09-14 02-12-2133    Clinical Impression Statement Pt presents today with R hemiparesis, weakness, decr coordination, decr ROM, spasticity, decr balance/functional mobility, cognitive deficits, visual deficits.   Pt would benefit from occupational therapy to address these deficits in order to incr independence with ADLs/IADLs, decr caregiver burden, and improve dominant RUE functional use.   Occupational Profile and client history currently impacting functional performance Pt is a 66 y.o. female s/p multiple CVAs.  Pt c/o initial R hand weakness with MRI showing L parietal cortex and occipital lobe infarcts.  Pt went to ED for further work-up.  During cerebral angiogram 06/03/17, pt sustained bilateral MCA strokes.  Pt with PMH that includes:  pseudotumor cerebri with shunt; peripheral vision loss; HTN; diabetic peripheral neuropathy; anxiety; hearing loss; DM; hx of seizure; PEG tube (anticipated removal 08/20/17).  Pt was independent with BADLs prior to CVAs, but did not drive due to visual deficits.    Occupational performance deficits (Please refer to evaluation for details): ADL's;IADL's;Leisure;Social Participation   Rehab Potential Good   Current Impairments/barriers affecting progress: anxiety   OT Frequency 2x / week   OT Duration 8 weeks  +eval   OT Treatment/Interventions Self-care/ADL training;Cryotherapy;Therapeutic exercise;DME and/or AE instruction;Therapist, nutritional;Therapeutic activities;Patient/family education;Cognitive remediation/compensation;Manual Therapy;Neuromuscular education;Fluidtherapy;Ultrasound;Moist Heat;Energy conservation;Passive range of motion;Therapeutic exercises;Visual/perceptual remediation/compensation;Balance training   Plan initiate HEP for RUE  (coordination, RUE functional use)   Clinical Decision Making Multiple treatment options, significant modification of task necessary   Recommended Other Services speech therapy   Consulted and Agree with Plan of Care Patient      Patient will benefit from skilled therapeutic intervention in order to improve the following deficits and impairments:  Decreased balance, Impaired vision/preception, Impaired tone, Decreased range of motion, Decreased cognition, Decreased activity tolerance, Decreased coordination, Decreased knowledge of use of DME, Impaired UE functional use, Decreased mobility  Visit Diagnosis: Hemiplegia and hemiparesis following cerebral infarction affecting right dominant side (HCC)  Muscle weakness (generalized)  Other lack of coordination  Other symptoms and signs involving the nervous system  Unsteadiness on feet  Other abnormalities of gait and mobility  Visuospatial deficit  Other symptoms and signs involving cognitive functions following cerebral infarction      G-Codes - 09-14-17 13-Feb-2156    Functional Assessment Tool Used (Outpatient only) 9-hole peg test: R-52min 47sec.  Box and blocks test:  R-31 blocks.  R grip strength:  7lbs.  Using RUE minimally <10% for eating   Functional Limitation Carrying, moving and handling objects   Carrying, Moving and Handling Objects Current Status [phone]) At least 80 percent but less than 100 percent impaired, limited or restricted   Carrying, Moving and Handling Objects Goal Status (X4128) At least 20 percent but less than 40 percent impaired, limited or restricted      Problem List Patient Active Problem List   Diagnosis Date Noted  . Adjustment disorder with mixed anxiety and depressed mood   . Hemoptysis   . Hematemesis   . Intractable vomiting with nausea   . Acute lower UTI   . Urinary retention   . Diabetic gastroparesis ([place])   . Vascular headache   . Diabetes mellitus ([place])   . CVA (cerebral vascular  accident) ([place]) 06/18/2017  . Weakness   . 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 Right hemiparesis ([place])   . DNR (do not resuscitate) discussion 06/16/2017  . Palliative care by specialist 06/16/2017  . Anxiety state   . Acute embolic stroke ([place])   . Benign essential HTN   . Diabetic peripheral neuropathy ([place])   . Pseudotumor cerebri   . Hypokalemia   . Seizure ([place])   . Leukocytosis   . Acute blood loss anemia   . Post-operative pain   . Encounter for feeding tube placement   . Respiratory failure ([place])   . Status post tracheostomy ([place])   . Acute respiratory failure with hypoxemia ([place])   . Stenosis of left carotid artery   . Acute CVA (cerebrovascular accident) ([place]) 05/31/2017  . Type II diabetes mellitus with complication ([place]) 78/67/6720  . Hypertension 05/31/2017  . Hyponatremia 05/31/2017  . IIH (idiopathic intracranial hypertension) 01/18/2016  . Malfunction of ventriculo-peritoneal shunt ([place]) 01/18/2016  . 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 Worsening headaches 01/18/2016  . Increased abdominal girth 01/18/2016  . Abdominal fluid collection 01/18/2016  . Vision changes 01/18/2016  . Perceived hearing changes 01/18/2016  . Nausea without vomiting 01/18/2016  . Peripheral vision loss 01/18/2016  . Imbalance 01/18/2016  . Falls 01/18/2016  . Dysphagia, oropharyngeal 01/18/2016  . Subacute confusional state 01/18/2016  . Left breast mass 05/05/2011    [facility] 08/16/2017, 10:05 PM  [facility] [address] [place], [place], [address] Phone: [phone]   Fax:  [phone]  Name: MARGARETH KANNER MRN: [id number] Date of Birth: [birth year]   Vianne Bulls, OTR/L [facility] [address] [place], [place]  [address] [phone] phone [phone] 08/16/17 10:05 PM

## 2017-08-16 NOTE — Telephone Encounter (Signed)
Dr. Jaynee Eagles,  [name] was seen today for occupational therapy evaluation and is scheduled for upcoming physical therapy evaluation.  Pt/daughter reports that pt was receiving home health speech therapy services, but had to stop due to start outpatient OT and PT.  Pt would like to resume speech therapy in outpatient setting.  Please place an order for outpatient speech therapy evaluation s/p CVA via Epic if you agree.   Thank you,  Vianne Bulls, OTR/L [facility] [address] [place], [place]  [address] [phone] phone [phone] 08/16/17 10:13 PM

## 2017-08-16 NOTE — Addendum Note (Signed)
Addended by: Gildardo Griffes on: 08/16/2017 02:19 PM   Modules accepted: Orders

## 2017-08-16 NOTE — Telephone Encounter (Signed)
Patient's daughter calling to get a referral for speech therapy. A returned call is not needed unless there are questions.

## 2017-08-16 NOTE — Telephone Encounter (Signed)
Referral sent to OP Speech Therapy.

## 2017-08-19 ENCOUNTER — Ambulatory Visit: Payer: Medicare Other | Admitting: Physical Medicine & Rehabilitation

## 2017-08-20 ENCOUNTER — Ambulatory Visit (HOSPITAL_COMMUNITY)
Admission: RE | Admit: 2017-08-20 | Discharge: 2017-08-20 | Disposition: A | Discharge status: Home / Self Care | Payer: Medicare Other | Source: Ambulatory Visit | Attending: Physical Medicine & Rehabilitation | Admitting: Physical Medicine & Rehabilitation

## 2017-08-20 ENCOUNTER — Encounter (HOSPITAL_COMMUNITY): Payer: Self-pay | Admitting: Radiology

## 2017-08-20 DIAGNOSIS — Z431 Encounter for attention to gastrostomy: Secondary | ICD-10-CM | POA: Insufficient documentation

## 2017-08-20 DIAGNOSIS — Z8673 Personal history of transient ischemic attack (TIA), and cerebral infarction without residual deficits: Secondary | ICD-10-CM | POA: Diagnosis not present

## 2017-08-20 DIAGNOSIS — I69391 Dysphagia following cerebral infarction: Secondary | ICD-10-CM

## 2017-08-20 DIAGNOSIS — I69351 Hemiplegia and hemiparesis following cerebral infarction affecting right dominant side: Secondary | ICD-10-CM

## 2017-08-20 HISTORY — PX: IR GASTROSTOMY TUBE REMOVAL: IMG5492

## 2017-08-20 MED ORDER — LIDOCAINE VISCOUS 2 % MT SOLN
OROMUCOSAL | Status: AC
  Filled 2017-08-20: qty 15

## 2017-08-20 MED ORDER — LIDOCAINE VISCOUS 2 % MT SOLN
OROMUCOSAL | Status: DC | PRN
  Administered 2017-08-20: 15 mL via OROMUCOSAL

## 2017-08-22 ENCOUNTER — Other Ambulatory Visit: Payer: Self-pay | Admitting: Neurology

## 2017-08-22 DIAGNOSIS — I639 Cerebral infarction, unspecified: Secondary | ICD-10-CM

## 2017-08-22 DIAGNOSIS — G4733 Obstructive sleep apnea (adult) (pediatric): Secondary | ICD-10-CM

## 2017-08-25 ENCOUNTER — Encounter: Payer: Self-pay | Admitting: Occupational Therapy

## 2017-08-25 ENCOUNTER — Encounter: Payer: Self-pay | Admitting: Rehabilitative and Restorative Service Providers"

## 2017-08-25 ENCOUNTER — Ambulatory Visit: Payer: Medicare Other | Attending: Neurology | Admitting: Rehabilitative and Restorative Service Providers"

## 2017-08-25 ENCOUNTER — Ambulatory Visit: Payer: Medicare Other | Admitting: Occupational Therapy

## 2017-08-25 DIAGNOSIS — R2689 Other abnormalities of gait and mobility: Secondary | ICD-10-CM

## 2017-08-25 DIAGNOSIS — R2681 Unsteadiness on feet: Secondary | ICD-10-CM

## 2017-08-25 DIAGNOSIS — I69318 Other symptoms and signs involving cognitive functions following cerebral infarction: Secondary | ICD-10-CM | POA: Insufficient documentation

## 2017-08-25 DIAGNOSIS — R278 Other lack of coordination: Secondary | ICD-10-CM

## 2017-08-25 DIAGNOSIS — R41842 Visuospatial deficit: Secondary | ICD-10-CM | POA: Diagnosis present

## 2017-08-25 DIAGNOSIS — M6281 Muscle weakness (generalized): Secondary | ICD-10-CM | POA: Diagnosis not present

## 2017-08-25 DIAGNOSIS — R41841 Cognitive communication deficit: Secondary | ICD-10-CM | POA: Insufficient documentation

## 2017-08-25 DIAGNOSIS — I69351 Hemiplegia and hemiparesis following cerebral infarction affecting right dominant side: Secondary | ICD-10-CM | POA: Insufficient documentation

## 2017-08-25 DIAGNOSIS — R1312 Dysphagia, oropharyngeal phase: Secondary | ICD-10-CM | POA: Insufficient documentation

## 2017-08-25 DIAGNOSIS — R29818 Other symptoms and signs involving the nervous system: Secondary | ICD-10-CM | POA: Diagnosis present

## 2017-08-25 NOTE — Patient Instructions (Signed)
Functional Quadriceps: Sit to Stand    Sit on edge of chair, USE YOUR HANDS* AND HAVE YOUR WALKER IN FRONT OF YOU.  ALSO HAVE HELP FROM YOUR DAUGHTER OR HUSBAND.  Stand upright, extending knees fully. Repeat _5___ times per set.  Do __1-2__ sessions per day.  http://orth.exer.us/735   Copyright  VHI. All rights reserved.

## 2017-08-25 NOTE — Therapy (Signed)
[facility] [address] [place], [place], [address] Phone: [phone]   Fax:  [phone]  Occupational Therapy Treatment  Patient Details  Name: [name] MRN: [id number] Date of Birth: 16-Jan-1951 Referring Provider: Dr. Alysia Penna   Encounter Date: 08/25/2017  OT End of Session - 08/25/17 1310    Visit Number  2    Number of Visits  17    Date for OT Re-Evaluation  10/15/17    Authorization Type  [employer] Medicare, no visit limit/no auth; G-code needed    Authorization - Visit Number  2    Authorization - Number of Visits  10    OT Start Time  [phone]    OT Stop Time  1015    OT Time Calculation (min)  41 min    Activity Tolerance  Patient limited by pain    Behavior During Therapy  Anxious tearful/labile   tearful/labile      Past Medical History:  Diagnosis Date  . Anemia    takes iron supplement  . Anesthesia complication    disorientation due to pseudotumor  . Diabetes mellitus    IDDM  . Family history of anesthesia complication 57 yrs ago   brother stopped breathing for a minute or two  . GERD (gastroesophageal reflux disease)   . Headache(784.0)    due to pseudotumor; daily headache  . Hypercholesteremia    unable to tolerate statins  . Hypertension    under control with meds., has been on med. x 5 yr.  . Peripheral neuropathy   . PONV (postoperative nausea and vomiting)   . 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 Synovitis of ankle 04/2013   left  . Wears dentures    full    Past Surgical History:  Procedure Laterality Date  . APPENDECTOMY     as a child  . BLADDER SUSPENSION    . BREAST LUMPECTOMY W/ NEEDLE LOCALIZATION Left 05/22/2011  . ESOPHAGEAL DILATION  06/23/2006; 08/05/2004  . IR ANGIO VERTEBRAL SEL SUBCLAVIAN INNOMINATE UNI R MOD SED  06/03/2017  . IR GASTROSTOMY TUBE MOD SED  06/25/2017  . IR  GASTROSTOMY TUBE REMOVAL  08/20/2017  . IR PERCUTANEOUS ART THROMBECTOMY/INFUSION INTRACRANIAL INC DIAG ANGIO  06/03/2017  . IR PERCUTANEOUS ART THROMBECTOMY/INFUSION INTRACRANIAL INC DIAG ANGIO  06/03/2017  . LUMBAR PERITONEAL SHUNT     x 2  . OVARIAN CYST REMOVAL     age 66  . SHUNT REVISION  2007  . TOTAL ABDOMINAL HYSTERECTOMY W/ BILATERAL SALPINGOOPHORECTOMY  1994   age 66s  . WRIST SURGERY Left 2014    [name]    There were no vitals filed for this visit.  Subjective Assessment - 08/25/17 0850    Subjective   Slid off bed yesterday and husband hurt R wrist when catching her.  Appt with Ortho MD this afternoon.  PEG tube taken out last Friday.      Patient is accompained by:  Family member daughter   daughter   Pertinent History  recent L parietal cortex and occipital lobe infarcts per 05/31/17 MRI; 8/16 bilateral MCA infarcts during angiogram procedure;  pseudotumor cerebri with shunt; peripheral vision loss; HTN; diabetic peripheral neuropathy; anxiety; hearing loss; DM; hx of seizure; PEG tube (anticipated removal 08/20/17)    Limitations  swallowing precautions:  diced up, crushing pills per dtr; fall risk;  cognitive deficits; hard of hearing; peripheral vision loss; hx of seizure    Patient Stated Goals  "I'm not sure"  (ADLs and RUE functional use when prompted)    Currently in Pain?  Yes    Pain Score  7     Pain Orientation  Right    Pain Descriptors / Indicators  -- unable to describe   unable to describe   Pain Type  Acute pain    Pain Onset  Yesterday    Aggravating Factors   movement    Pain Relieving Factors  rest       Pt observed to move R wrist spontaneously without incr distress during session, but when asked about wrist, pt became tearful.  Recommended/discussed that we would not attempt to move/use R hand/wrist during therapy today until pt follows up with Ortho MD later this morning as pt tearful and reports pain with wrist movement (no pain at rest).  Pt  reports that she has not taken any OTC pain meds or used ice.  Pt with mild edema at R wrist, but no discoloration noted and finger movement appears at baseline.  Encouraged pt/dtr to ask ortho MD if she has any restrictions and if she can take any OTC meds safely for pain.  Pt/dtr verbalized understanding.    Pt transferred w/c>mat with supervision.    In supine, AROM/AAROM shoulder flex with mod cueing to follow directions and to avoid guarding patterns.  Then AROM and AAROM elbow extension with mod cueing for directions and guarding patterns.  Pt needed multiple rest breaks and significant prompts/reassurance to move shoulder/elbow.  Pt tearful and would suddenly report pain during movement, even with movement that she previously performed without pain.  Pt demo greater ROM in sitting vs. Supine.  When questioned, pt reported numbness in fingers or pain in tricep area (unable to describe).  Dtr reports that pt has been transferring sit>stand without assist since OT eval and using RUE to eat with (prior to injury/fall yesterday)                     OT Education - 08/25/17 1309    Education provided  Yes    Education Details  Recommended pt discuss pain with Dr. Letta Pate at upcoming appt; Anxiety/guarding patterns appear to be affecting RUE functional use and movement (even of shoulder/elbow)    Person(s) Educated  Patient;Child(ren)    Methods  Explanation    Comprehension  Verbalized understanding       OT Short Term Goals - 08/16/17 2147      OT SHORT TERM GOAL #1   Title  Pt/caregiver will be independent with initial HEP--check STGs 09/14/17    Time  4    Period  Weeks    Status  New      OT SHORT TERM GOAL #2   Title  Pt will perform bathing with mod A.    Time  4    Period  Weeks    Status  New      OT SHORT TERM GOAL #3   Title  Pt will perform dressing with min A.    Time  4    Period  Weeks    Status  New      OT SHORT TERM GOAL #4   Title  Pt will  improve R grip strength by at least 8lbs to assist with pulling up pants, opening containers.    Baseline  7lbs  Time  4    Period  Weeks    Status  New      OT SHORT TERM GOAL #5   Title  Pt will improve RUE coordination and functional reaching as shown by improving score on box and blocks test by at least 6 blocks.    Baseline  31blocks    Time  4    Period  Weeks    Status  New      OT SHORT TERM GOAL #6   Title  Pt will be able to eat with dominant RUE at least 50% of the time.    Time  4    Period  Weeks    Status  New        OT Long Term Goals - 08/16/17 2151      OT LONG TERM GOAL #1   Title  Pt/caregiver will be independent with initial HEP--check LTGs 10/15/17    Time  8    Period  Weeks    Status  New      OT LONG TERM GOAL #2   Title  Pt will perform bathing with min A.    Time  8    Period  Weeks    Status  New      OT LONG TERM GOAL #3   Title  Pt will perform dressing mod I.    Time  8    Period  Weeks    Status  New      OT LONG TERM GOAL #4   Title  Pt will improve R grip strength by at least 12lbs to assist with pulling up pants, opening containers.    Baseline  7lbs    Time  8    Period  Weeks    Status  New      OT LONG TERM GOAL #5   Title  Pt will improve RUE coordination for ADLs as shown by completing 9-hole peg test in 100sec or less.    Baseline  8min 47sec    Time  8    Period  Weeks    Status  New      OT LONG TERM GOAL #6   Title  Pt will use dominant RUE to eat at least 75% of the time.    Time  8    Period  Weeks    Status  New            Plan - 08/25/17 1311    Clinical Impression Statement  Pt with new R wrist pain with follow-up with ortho MD later this morning.  Pt observed to move R wrist spontaneously without incr distress, but when asked about wrist, pt became tearful.  Lability, guarding patterns, and anxiety appear to be affecting attempts for RUE movement (even shoulder/elbow movement).      Rehab  Potential  Good    Current Impairments/barriers affecting progress:  anxiety, guarding patterns    OT Frequency  2x / week    OT Duration  8 weeks +eval   +eval   OT Treatment/Interventions  Self-care/ADL training;Cryotherapy;Therapeutic exercise;DME and/or AE instruction;Therapist, nutritional;Therapeutic activities;Patient/family education;Cognitive remediation/compensation;Manual Therapy;Neuromuscular education;Fluidtherapy;Ultrasound;Moist Heat;Energy conservation;Passive range of motion;Therapeutic exercises;Visual/perceptual remediation/compensation;Balance training    Plan  check to see how ortho appt went for R wrist/check on pain/see if pt has any restrictions, initiate HEP per pt tolerance (coordination, RUE functional use, cane ex)    Consulted and Agree with Plan of Care  Patient;Family member/caregiver  Family Member Consulted  daughter       Patient will benefit from skilled therapeutic intervention in order to improve the following deficits and impairments:  Decreased balance, Impaired vision/preception, Impaired tone, Decreased range of motion, Decreased cognition, Decreased activity tolerance, Decreased coordination, Decreased knowledge of use of DME, Impaired UE functional use, Decreased mobility  Visit Diagnosis: Hemiplegia and hemiparesis following cerebral infarction affecting right dominant side (HCC)  Other lack of coordination  Visuospatial deficit  Other symptoms and signs involving cognitive functions following cerebral infarction  Other symptoms and signs involving the nervous system  Unsteadiness on feet  Other abnormalities of gait and mobility  Muscle weakness (generalized)    Problem List Patient Active Problem List   Diagnosis Date Noted  . Adjustment disorder with mixed anxiety and depressed mood   . Hemoptysis   . Hematemesis   . Intractable vomiting with nausea   . Acute lower UTI   . Urinary retention   . Diabetic gastroparesis  ([place])   . Vascular headache   . Diabetes mellitus ([place])   . CVA (cerebral vascular accident) ([place]) 06/18/2017  . Weakness   . 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 Right hemiparesis ([place])   . DNR (do not resuscitate) discussion 06/16/2017  . Palliative care by specialist 06/16/2017  . Anxiety state   . Acute embolic stroke ([place])   . Benign essential HTN   . Diabetic peripheral neuropathy ([place])   . Pseudotumor cerebri   . Hypokalemia   . Seizure ([place])   . Leukocytosis   . Acute blood loss anemia   . Post-operative pain   . Encounter for feeding tube placement   . Respiratory failure ([place])   . Status post tracheostomy ([place])   . Acute respiratory failure with hypoxemia ([place])   . Stenosis of left carotid artery   . Acute CVA (cerebrovascular accident) ([place]) 05/31/2017  . Type II diabetes mellitus with complication ([place]) 40/98/1191  . Hypertension 05/31/2017  . Hyponatremia 05/31/2017  . IIH (idiopathic intracranial hypertension) 01/18/2016  . Malfunction of ventriculo-peritoneal shunt ([place]) 01/18/2016  . 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 Worsening headaches 01/18/2016  . Increased abdominal girth 01/18/2016  . Abdominal fluid collection 01/18/2016  . Vision changes 01/18/2016  . Perceived hearing changes 01/18/2016  . Nausea without vomiting 01/18/2016  . Peripheral vision loss 01/18/2016  . Imbalance 01/18/2016  . Falls 01/18/2016  . Dysphagia, oropharyngeal 01/18/2016  . Subacute confusional state 01/18/2016  . Left breast mass 05/05/2011    [facility] 08/25/2017, 3:11 PM  [facility] [address] [place], [place], [address] Phone: [phone]   Fax:  [phone]  Name: KAILENA LUBAS MRN: [id number] Date of Birth: 1951/06/13   Vianne Bulls, OTR/L [facility] [address] [place], [place]  [address] [phone]  phone [phone] 08/25/17 3:11 PM
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
## 2017-08-27 ENCOUNTER — Ambulatory Visit: Payer: Medicare Other

## 2017-08-27 ENCOUNTER — Ambulatory Visit: Payer: Medicare Other | Admitting: Occupational Therapy

## 2017-08-27 DIAGNOSIS — M6281 Muscle weakness (generalized): Secondary | ICD-10-CM | POA: Diagnosis not present

## 2017-08-27 DIAGNOSIS — I69351 Hemiplegia and hemiparesis following cerebral infarction affecting right dominant side: Secondary | ICD-10-CM

## 2017-08-27 DIAGNOSIS — I69318 Other symptoms and signs involving cognitive functions following cerebral infarction: Secondary | ICD-10-CM

## 2017-08-27 DIAGNOSIS — R1312 Dysphagia, oropharyngeal phase: Secondary | ICD-10-CM

## 2017-08-27 DIAGNOSIS — R278 Other lack of coordination: Secondary | ICD-10-CM

## 2017-08-27 DIAGNOSIS — R41841 Cognitive communication deficit: Secondary | ICD-10-CM

## 2017-08-27 NOTE — Therapy (Signed)
[facility] [address] [place], [place], [address] Phone: [phone]   Fax:  [phone]  Speech Language Pathology Evaluation  Patient Details  Name: [name] MRN: [id number] Date of Birth: 1951-10-10 Referring Provider: Marisa Sprinkles   Encounter Date: 08/27/2017  End of Session - 08/27/17 1404    Visit Number  1    Number of Visits  17    Date for SLP Re-Evaluation  11/05/17    Activity Tolerance  Patient tolerated treatment well       Past Medical History:  Diagnosis Date  . Anemia    takes iron supplement  . Anesthesia complication    disorientation due to pseudotumor  . Diabetes mellitus    IDDM  . Family history of anesthesia complication 51 yrs ago   brother stopped breathing for a minute or two  . GERD (gastroesophageal reflux disease)   . Headache(784.0)    due to pseudotumor; daily headache  . Hypercholesteremia    unable to tolerate statins  . Hypertension    under control with meds., has been on med. x 5 yr.  . Peripheral neuropathy   . PONV (postoperative nausea and vomiting)   . 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 Seizures ([place])    due to shunt failure; last seizure 2007  . Synovitis of ankle 04/2013   left  . Wears dentures    full    Past Surgical History:  Procedure Laterality Date  . APPENDECTOMY     as a child  . BLADDER SUSPENSION    . BREAST LUMPECTOMY W/ NEEDLE LOCALIZATION Left 05/22/2011  . ESOPHAGEAL DILATION  06/23/2006; 08/05/2004  . IR ANGIO VERTEBRAL SEL SUBCLAVIAN INNOMINATE UNI R MOD SED  06/03/2017  . IR GASTROSTOMY TUBE MOD SED  06/25/2017  . IR GASTROSTOMY TUBE REMOVAL  08/20/2017  . IR PERCUTANEOUS ART THROMBECTOMY/INFUSION INTRACRANIAL INC DIAG ANGIO  06/03/2017  . IR PERCUTANEOUS ART THROMBECTOMY/INFUSION INTRACRANIAL INC DIAG ANGIO  06/03/2017  . LUMBAR PERITONEAL SHUNT     x 2  . OVARIAN CYST REMOVAL     age 18  . SHUNT REVISION   2007  . TOTAL ABDOMINAL HYSTERECTOMY W/ BILATERAL SALPINGOOPHORECTOMY  1994   age 17s  . WRIST SURGERY Left 2014    [name]    There were no vitals filed for this visit.  Subjective Assessment - 08/27/17 0902    Subjective  Pt arrives with husband today. Has had HHST Hershal [facility]ia)    Currently in Pain?  Yes    Pain Score  6     Pain Location  Wrist    Pain Orientation  Right    Pain Descriptors / Indicators  Throbbing    Pain Type  Acute pain    Pain Onset  In the past 7 days    Pain Frequency  Constant    Aggravating Factors   "it's hard to explain"    Pain Relieving Factors  "nothing, since it started"         SLP Evaluation [facility] - 08/27/17 0902      SLP Visit Information   SLP Received On  08/27/17    Referring Provider  Sarina Ill, MD    Onset Date  June 03, 2017    Medical Diagnosis  CVA      General Information   HPI HPI: 66yo female with hx HTN, DM who presented initially 8/13 to her PCP with headache,  R sided weakness and vision changes.  She was sent for MRI which revealed multiple areas of acute L MCA infarct.  She was sent to ER and admitted for CVA workup. On 8/16 pt was in cath lab for diagnostic carotid and cerebral angiogram with vascular surgery when she became suddenly unresponsive with L sided weakness and possible L sided seizure-like activity.  CTA revealed bilateral M1 occlusions.  She was intubated and taken urgently to IR for revascularization and rapid resolution of embolism. Modified (MBSS) recommended dys 2/thin. Underwent tx on CIR - d/c'd 07-08-17, and short course of HHST.       Prior Functional Status   Cognitive/Linguistic Baseline  Within functional limits    Type of Home  Mobile home     Lives With  Spouse;Family      Cognition   Overall Cognitive Status  Impaired/Different from baseline    Area of Impairment  Memory;Awareness;Attention    Memory  Decreased short-term memory    Awareness  Intellectual      Please  note: Specific cognitive assessment results are found in flowsheets version of evaluation for today's ST evaluation. Please see flowsheets for those details.                SLP Education - 08/27/17 1007    Education provided  Yes    Education Details  memory compensations (calendar, notepad), ST eval results    Person(s) Educated  Patient;Spouse    Methods  Explanation    Comprehension  Verbalized understanding;Need further instruction       SLP Short Term Goals - 08/27/17 1403      SLP SHORT TERM GOAL #1   Title  pt will demo selective attention for 10 minutes in min noisy environment in simple cognitive linguistic task over three sessions    Time  4    Period  Weeks    Status  New      SLP [place] #2   Title  pt will tell SLP 3 non-physical deficits without cues    Time  4    Period  Weeks    Status  New      SLP SHORT TERM GOAL #3   Title  Pt will tell SLP 4 memory strategies with occasional min A    Time  4    Period  Weeks    Status  New      SLP SHORT TERM GOAL #4   Title  pt will perform safe swallow strategies with POs, given occasional min A over 3 sessions    Time  4    Period  Weeks    Status  New      SLP SHORT TERM GOAL #5   Title  pt will demo HEP for swallowing with occasional min A    Time  4    Period  Weeks    Status  New       SLP Long Term Goals - 08/27/17 1403      SLP LONG TERM GOAL #1   Title  pt will alternate attention with simple-mod complex cognitive linguistic tasks with at least 90% overall with self correction allowed, over three sessions    Time  8    Period  Weeks    Status  New      SLP LONG TERM GOAL #2   Title  pt will demo emergent awareness by self correcting 100% of errors on simple-mod complec linguistic tasks with  verbal cues, rarely    Time  8    Period  Weeks    Status  New      SLP LONG TERM GOAL #3   Title  pt will utilize a memory strategy in 5 ST sessions with nonverbal cues    Time  8     Period  Weeks    Status  New      SLP LONG TERM GOAL #4   Title  pt will perform swallow HEP with modified independence    Time  8    Period  Weeks    Status  New       Plan - 09/05/17 1403    Clinical Impression Statement  Pt presents today with cognitive-communidation deficits assessed in the area of memory, awareness, and problem solving/reasoning, however other cognitive deficits are suspected in attention. Pt scored at least 2 standard deviations below the mean in both recall and recognitions portions of the [facility] Verbal Learning Test. Further evaluation will be necessary to more fully gain understanding of depth of pt's cognitive-linguistic deficits. Pt was recommended dys 2/thin during her hospital stay and this was not upgraded, to SLP's knowledge based upon her chart. SLP did not have time to address today and will touch upon this ASAP. She would benefit from skilled ST focusing on cognitive-linguistic skills and also to monitor swalllowing safety.    Speech Therapy Frequency  2x / week    Duration  -- 8 weeks    Treatment/Interventions  Aspiration precaution training;Pharyngeal strengthening exercises;Diet toleration management by SLP;Environmental controls;Compensatory techniques;Trials of upgraded texture/liquids;Internal/external aids;Cognitive reorganization;Cueing hierarchy;SLP instruction and feedback;Functional tasks;Patient/family education any or all may be used    Potential to Achieve Goals  Good    Consulted and Agree with Plan of Care  Patient;Family member/caregiver    Family Member Consulted  husband       Patient will benefit from skilled therapeutic intervention in order to improve the following deficits and impairments:   Cognitive communication deficit  Dysphagia, oropharyngeal phase  G-Codes - 09/05/2017 1355    Functional Assessment Tool Used  noms, clinical judgment    Functional Limitations  Swallowing    Swallow Current Status (V9563)  At least 40  percent but less than 60 percent impaired, limited or restricted    Swallow Goal Status (O7564)  At least 1 percent but less than 20 percent impaired, limited or restricted       Problem List Patient Active Problem List   Diagnosis Date Noted  . Adjustment disorder with mixed anxiety and depressed mood   . Hemoptysis   . Hematemesis   . Intractable vomiting with nausea   . Acute lower UTI   . Urinary retention   . Diabetic gastroparesis ([place])   . Vascular headache   . Diabetes mellitus ([place])   . CVA (cerebral vascular accident) ([place]) 06/18/2017  . Weakness   . 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 Right hemiparesis ([place])   . DNR (do not resuscitate) discussion 06/16/2017  . Palliative care by specialist 06/16/2017  . Anxiety state   . Acute embolic stroke ([place])   . Benign essential HTN   . Diabetic peripheral neuropathy ([place])   . Pseudotumor cerebri   . Hypokalemia   . Seizure ([place])   . Leukocytosis   . Acute blood loss anemia   . Post-operative pain   . Encounter for feeding tube placement   . Respiratory failure ([place])   .  Status post tracheostomy ([place])   . Acute respiratory failure with hypoxemia ([place])   . Stenosis of left carotid artery   . Acute CVA (cerebrovascular accident) ([place]) 05/31/2017  . Type II diabetes mellitus with complication ([place]) 65/78/4696  . Hypertension 05/31/2017  . Hyponatremia 05/31/2017  . IIH (idiopathic intracranial hypertension) 01/18/2016  . Malfunction of ventriculo-peritoneal shunt ([place]) 01/18/2016  . 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 Worsening headaches 01/18/2016  . Increased abdominal girth 01/18/2016  . Abdominal fluid collection 01/18/2016  . Vision changes 01/18/2016  . Perceived hearing changes 01/18/2016  . Nausea without vomiting 01/18/2016  . Peripheral vision loss 01/18/2016  . Imbalance 01/18/2016  . Falls 01/18/2016  . Dysphagia, oropharyngeal 01/18/2016  . Subacute confusional state  01/18/2016  . Left breast mass 05/05/2011    [facility] ,MS, CCC-SLP  08/27/2017, 2:04 PM  [facility] [address] [place], [place], [address] Phone: [phone]   Fax:  [phone]  Name: [name] MRN: [id number] Date of Birth: November 27, 1950

## 2017-08-27 NOTE — Patient Instructions (Signed)
Keeping ONE calendar in a central location will assist you in remembering things.  You may want to have husband write one thing down you'd like to accomplish that day.

## 2017-08-27 NOTE — Therapy (Signed)
[facility] [address] [place], [place], [address] Phone: [phone]   Fax:  [phone]  Occupational Therapy Treatment  Patient Details  Name: [name] MRN: [id number] Date of Birth: 1951-04-16 Referring Provider: Dr. Alysia Penna   Encounter Date: 08/27/2017  OT End of Session - 08/27/17 0906    Visit Number  3    Number of Visits  17    Date for OT Re-Evaluation  10/15/17    Authorization Type  [employer] Medicare, no visit limit/no auth; G-code needed    Authorization - Visit Number  3    Authorization - Number of Visits  10    OT Start Time  0804    OT Stop Time  0845    OT Time Calculation (min)  41 min       Past Medical History:  Diagnosis Date  . Anemia    takes iron supplement  . Anesthesia complication    disorientation due to pseudotumor  . Diabetes mellitus    IDDM  . Family history of anesthesia complication 47 yrs ago   brother stopped breathing for a minute or two  . GERD (gastroesophageal reflux disease)   . Headache(784.0)    due to pseudotumor; daily headache  . Hypercholesteremia    unable to tolerate statins  . Hypertension    under control with meds., has been on med. x 5 yr.  . Peripheral neuropathy   . PONV (postoperative nausea and vomiting)   . 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 Seizures ([place])    due to shunt failure; last seizure 2007  . Synovitis of ankle 04/2013   left  . Wears dentures    full    Past Surgical History:  Procedure Laterality Date  . APPENDECTOMY     as a child  . BLADDER SUSPENSION    . BREAST LUMPECTOMY W/ NEEDLE LOCALIZATION Left 05/22/2011  . ESOPHAGEAL DILATION  06/23/2006; 08/05/2004  . IR ANGIO VERTEBRAL SEL SUBCLAVIAN INNOMINATE UNI R MOD SED  06/03/2017  . IR GASTROSTOMY TUBE MOD SED  06/25/2017  . IR GASTROSTOMY TUBE REMOVAL  08/20/2017  . IR PERCUTANEOUS ART THROMBECTOMY/INFUSION INTRACRANIAL INC DIAG ANGIO   06/03/2017  . IR PERCUTANEOUS ART THROMBECTOMY/INFUSION INTRACRANIAL INC DIAG ANGIO  06/03/2017  . LUMBAR PERITONEAL SHUNT     x 2  . OVARIAN CYST REMOVAL     age 38  . SHUNT REVISION  2007  . TOTAL ABDOMINAL HYSTERECTOMY W/ BILATERAL SALPINGOOPHORECTOMY  1994   age 34s  . WRIST SURGERY Left 2014    [name]    There were no vitals filed for this visit.  Subjective Assessment - 08/27/17 0904    Subjective   Pt/ husband report pt saw MD and he put her in a wrist brace, they did not have further instructions as pt's daughter went with her to MD    Pertinent History  recent L parietal cortex and occipital lobe infarcts per 05/31/17 MRI; 8/16 bilateral MCA infarcts during angiogram procedure;  pseudotumor cerebri with shunt; peripheral vision loss; HTN; diabetic peripheral neuropathy; anxiety; hearing loss; DM; hx of seizure; PEG tube (anticipated removal 08/20/17)    Limitations  swallowing precautions:  diced up, crushing pills per dtr; fall risk; cognitive deficits; hard of hearing; peripheral vision loss; hx of seizure- wrist brace, await further clarification of precautions    Patient Stated Goals  "I'm not sure"  (ADLs and RUE functional use  when prompted)    Currently in Pain?  Yes    Pain Score  6     Pain Location  Wrist    Pain Orientation  Right    Pain Descriptors / Indicators  Aching    Pain Type  Acute pain    Pain Onset  In the past 7 days    Pain Frequency  Constant    Aggravating Factors   movement    Pain Relieving Factors  rest            Treatment: Pt arrived wearing wrist brace applied by orthopedic MD. Pt/ husband were unsure of any new precautions. Therefore ROM to wrist was not performed. Therapist adjusted splint for improved postioning. Supine unilateral AA/ROM to shoulder in flexion and abduction, and elbow flexion/ extension while hotpack applied to shoulder for pain relief.x12 mins, no adverse reactions. Shoulder and scapular retraction x 10 reps min-mod  v.c Pt was tearful due to emotional lability throughout  session requiring frequent rest breaks and encouragement. Seated low ranger RUE shoulder flexion aa/ROM while wearing wrist brace, 15 -20 reps min facilitation v.c, Pt transferred to w/c stand pivot with minguard                  OT Short Term Goals - 08/16/17 2147      OT SHORT TERM GOAL #1   Title  Pt/caregiver will be independent with initial HEP--check STGs 09/14/17    Time  4    Period  Weeks    Status  New      OT SHORT TERM GOAL #2   Title  Pt will perform bathing with mod A.    Time  4    Period  Weeks    Status  New      OT SHORT TERM GOAL #3   Title  Pt will perform dressing with min A.    Time  4    Period  Weeks    Status  New      OT SHORT TERM GOAL #4   Title  Pt will improve R grip strength by at least 8lbs to assist with pulling up pants, opening containers.    Baseline  7lbs    Time  4    Period  Weeks    Status  New      OT SHORT TERM GOAL #5   Title  Pt will improve RUE coordination and functional reaching as shown by improving score on box and blocks test by at least 6 blocks.    Baseline  31blocks    Time  4    Period  Weeks    Status  New      OT SHORT TERM GOAL #6   Title  Pt will be able to eat with dominant RUE at least 50% of the time.    Time  4    Period  Weeks    Status  New        OT Long Term Goals - 08/16/17 2151      OT LONG TERM GOAL #1   Title  Pt/caregiver will be independent with initial HEP--check LTGs 10/15/17    Time  8    Period  Weeks    Status  New      OT LONG TERM GOAL #2   Title  Pt will perform bathing with min A.    Time  8    Period  Weeks    Status  New      OT LONG TERM GOAL #3   Title  Pt will perform dressing mod I.    Time  8    Period  Weeks    Status  New      OT LONG TERM GOAL #4   Title  Pt will improve R grip strength by at least 12lbs to assist with pulling up pants, opening containers.    Baseline  7lbs    Time  8     Period  Weeks    Status  New      OT LONG TERM GOAL #5   Title  Pt will improve RUE coordination for ADLs as shown by completing 9-hole peg test in 100sec or less.    Baseline  15min 47sec    Time  8    Period  Weeks    Status  New      OT LONG TERM GOAL #6   Title  Pt will use dominant RUE to eat at least 75% of the time.    Time  8    Period  Weeks    Status  New            Plan - 08/27/17 0906    Clinical Impression Statement  Pt arrived today wearing wrist brace applied by MD. Pt's husband to clarify new recommendations with his daughter who attended the MD appoint with patient. Pt is progressing slowly limited by pain and emotional lability    Rehab Potential  Good    Current Impairments/barriers affecting progress:  anxiety, guarding patterns    OT Frequency  2x / week    OT Duration  8 weeks    OT Treatment/Interventions  Self-care/ADL training;Cryotherapy;Therapeutic exercise;DME and/or AE instruction;Therapist, nutritional;Therapeutic activities;Patient/family education;Cognitive remediation/compensation;Manual Therapy;Neuromuscular education;Fluidtherapy;Ultrasound;Moist Heat;Energy conservation;Passive range of motion;Therapeutic exercises;Visual/perceptual remediation/compensation;Balance training    Plan  issue HEP, clarify precautions regarding wrist ROM    Consulted and Agree with Plan of Care  Patient;Family member/caregiver    Family Member Consulted  husband       Patient will benefit from skilled therapeutic intervention in order to improve the following deficits and impairments:  Decreased balance, Impaired vision/preception, Impaired tone, Decreased range of motion, Decreased cognition, Decreased activity tolerance, Decreased coordination, Decreased knowledge of use of DME, Impaired UE functional use, Decreased mobility  Visit Diagnosis: Hemiplegia and hemiparesis following cerebral infarction affecting right dominant side (HCC)  Other lack of  coordination  Other symptoms and signs involving cognitive functions following cerebral infarction  Muscle weakness (generalized)    Problem List Patient Active Problem List   Diagnosis Date Noted  . Adjustment disorder with mixed anxiety and depressed mood   . Hemoptysis   . Hematemesis   . Intractable vomiting with nausea   . Acute lower UTI   . Urinary retention   . Diabetic gastroparesis ([place])   . Vascular headache   . Diabetes mellitus ([place])   . CVA (cerebral vascular accident) ([place]) 06/18/2017  . Weakness   . 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 Right hemiparesis ([place])   . DNR (do not resuscitate) discussion 06/16/2017  . Palliative care by specialist 06/16/2017  . Anxiety state   . Acute embolic stroke ([place])   . Benign essential HTN   . Diabetic peripheral neuropathy ([place])   . Pseudotumor cerebri   . Hypokalemia   . Seizure ([place])   . Leukocytosis   . Acute blood loss anemia   .  Post-operative pain   . Encounter for feeding tube placement   . Respiratory failure ([place])   . Status post tracheostomy ([place])   . Acute respiratory failure with hypoxemia ([place])   . Stenosis of left carotid artery   . Acute CVA (cerebrovascular accident) ([place]) 05/31/2017  . Type II diabetes mellitus with complication ([place]) 10/62/6948  . Hypertension 05/31/2017  . Hyponatremia 05/31/2017  . IIH (idiopathic intracranial hypertension) 01/18/2016  . Malfunction of ventriculo-peritoneal shunt ([place]) 01/18/2016  . 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 Worsening headaches 01/18/2016  . Increased abdominal girth 01/18/2016  . Abdominal fluid collection 01/18/2016  . Vision changes 01/18/2016  . Perceived hearing changes 01/18/2016  . Nausea without vomiting 01/18/2016  . Peripheral vision loss 01/18/2016  . Imbalance 01/18/2016  . Falls 01/18/2016  . Dysphagia, oropharyngeal 01/18/2016  . Subacute confusional state 01/18/2016  . Left breast mass 05/05/2011     Gilmer Kaminsky 08/27/2017, 9:09 AM  [facility] [address] [place], [place], [address] Phone: [phone]   Fax:  [phone]  Name: [name] MRN: [id number] Date of Birth: 1951-01-04

## 2017-08-31 ENCOUNTER — Ambulatory Visit: Payer: Medicare Other | Admitting: Occupational Therapy

## 2017-09-02 ENCOUNTER — Ambulatory Visit: Payer: Medicare Other | Admitting: Occupational Therapy

## 2017-09-06 ENCOUNTER — Ambulatory Visit: Payer: Medicare Other | Admitting: Physical Medicine & Rehabilitation

## 2017-09-06 ENCOUNTER — Encounter: Payer: Medicare Other | Attending: Physical Medicine & Rehabilitation

## 2017-09-06 DIAGNOSIS — I69351 Hemiplegia and hemiparesis following cerebral infarction affecting right dominant side: Secondary | ICD-10-CM | POA: Insufficient documentation

## 2017-09-06 DIAGNOSIS — R569 Unspecified convulsions: Secondary | ICD-10-CM | POA: Insufficient documentation

## 2017-09-06 DIAGNOSIS — Z8249 Family history of ischemic heart disease and other diseases of the circulatory system: Secondary | ICD-10-CM | POA: Insufficient documentation

## 2017-09-06 DIAGNOSIS — G932 Benign intracranial hypertension: Secondary | ICD-10-CM | POA: Insufficient documentation

## 2017-09-06 DIAGNOSIS — E7800 Pure hypercholesterolemia, unspecified: Secondary | ICD-10-CM | POA: Insufficient documentation

## 2017-09-06 DIAGNOSIS — Z90722 Acquired absence of ovaries, bilateral: Secondary | ICD-10-CM | POA: Insufficient documentation

## 2017-09-06 DIAGNOSIS — I10 Essential (primary) hypertension: Secondary | ICD-10-CM | POA: Insufficient documentation

## 2017-09-06 DIAGNOSIS — Z9889 Other specified postprocedural states: Secondary | ICD-10-CM | POA: Insufficient documentation

## 2017-09-06 DIAGNOSIS — Z90710 Acquired absence of both cervix and uterus: Secondary | ICD-10-CM | POA: Insufficient documentation

## 2017-09-06 DIAGNOSIS — E119 Type 2 diabetes mellitus without complications: Secondary | ICD-10-CM | POA: Insufficient documentation

## 2017-09-06 DIAGNOSIS — I69391 Dysphagia following cerebral infarction: Secondary | ICD-10-CM | POA: Insufficient documentation

## 2017-09-06 DIAGNOSIS — K219 Gastro-esophageal reflux disease without esophagitis: Secondary | ICD-10-CM | POA: Insufficient documentation

## 2017-09-07 ENCOUNTER — Ambulatory Visit: Payer: Medicare Other | Admitting: Neurology

## 2017-09-07 ENCOUNTER — Ambulatory Visit (HOSPITAL_COMMUNITY)
Admission: RE | Admit: 2017-09-07 | Discharge: 2017-09-07 | Disposition: A | Discharge status: Home / Self Care | Payer: Medicare Other | Source: Ambulatory Visit | Attending: Interventional Radiology | Admitting: Interventional Radiology

## 2017-09-07 ENCOUNTER — Encounter: Payer: Medicare Other | Admitting: Occupational Therapy

## 2017-09-07 DIAGNOSIS — I639 Cerebral infarction, unspecified: Secondary | ICD-10-CM

## 2017-09-07 HISTORY — PX: IR RADIOLOGIST EVAL & MGMT: IMG5224

## 2017-09-08 ENCOUNTER — Encounter (HOSPITAL_COMMUNITY): Payer: Self-pay | Admitting: Interventional Radiology

## 2017-09-13 ENCOUNTER — Encounter: Payer: Medicare Other | Admitting: Occupational Therapy

## 2017-09-14 ENCOUNTER — Ambulatory Visit: Payer: Medicare Other

## 2017-09-14 ENCOUNTER — Encounter: Payer: Medicare Other | Admitting: Occupational Therapy

## 2017-09-14 ENCOUNTER — Ambulatory Visit: Payer: Medicare Other | Admitting: *Deleted

## 2017-09-14 DIAGNOSIS — R41841 Cognitive communication deficit: Secondary | ICD-10-CM

## 2017-09-14 DIAGNOSIS — M6281 Muscle weakness (generalized): Secondary | ICD-10-CM | POA: Diagnosis not present

## 2017-09-14 DIAGNOSIS — R1312 Dysphagia, oropharyngeal phase: Secondary | ICD-10-CM

## 2017-09-14 NOTE — Therapy (Signed)
[facility] [address] [place], [place], [address] Phone: [phone]   Fax:  [phone]  Speech Language Pathology Treatment  Patient Details  Name: [name] MRN: [id number] Date of Birth: [birth year] Referring Provider: Marisa Sprinkles   Encounter Date: 09/14/2017  End of Session - 09/14/17 1252    Visit Number  2    Number of Visits  17    Date for SLP Re-Evaluation  11/05/17    SLP Start Time  3716    SLP Stop Time   1100    SLP Time Calculation (min)  45 min    Activity Tolerance  Patient tolerated treatment well       Past Medical History:  Diagnosis Date  . Anemia    takes iron supplement  . Anesthesia complication    disorientation due to pseudotumor  . Diabetes mellitus    IDDM  . Family history of anesthesia complication 8 yrs ago   brother stopped breathing for a minute or two  . GERD (gastroesophageal reflux disease)   . Headache(784.0)    due to pseudotumor; daily headache  . Hypercholesteremia    unable to tolerate statins  . Hypertension    under control with meds., has been on med. x 5 yr.  . Peripheral neuropathy   . PONV (postoperative nausea and vomiting)   . 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 Seizures ([place])    due to shunt failure; last seizure 2007  . Synovitis of ankle 04/2013   left  . Wears dentures    full    Past Surgical History:  Procedure Laterality Date  . ANKLE ARTHROSCOPY Left 05/18/2013   Procedure: LEFT ANKLE ARTHROSCOPY WITH DEBRIDEMENT,  SUBTALAR OPEN DEBRIDEMENT ;  Surgeon: Wylene Simmer, MD;  Location: [facility];  Service: Orthopedics;  Laterality: Left;  . APPENDECTOMY     as a child  . BALLOON DILATION N/A 07/04/2013   Procedure: BALLOON DILATION;  Surgeon: Garlan Fair, MD;  Location: [facility];  Service: Endoscopy;  Laterality: N/A;  . BLADDER SUSPENSION    . BREAST LUMPECTOMY W/ NEEDLE LOCALIZATION Left  05/22/2011  . CAROTID ANGIOGRAPHY Bilateral 06/03/2017   Procedure: Bilateral Carotid Angiography;  Surgeon: Conrad Lyons Switch, MD;  Location: [facility];  Service: Cardiovascular;  Laterality: Bilateral;  . COLONOSCOPY WITH PROPOFOL N/A 05/14/2014   Procedure: COLONOSCOPY WITH PROPOFOL;  Surgeon: Garlan Fair, MD;  Location: [facility];  Service: Endoscopy;  Laterality: N/A;  . ESOPHAGEAL DILATION  06/23/2006; 08/05/2004  . ESOPHAGOGASTRODUODENOSCOPY N/A 07/04/2013   Procedure: ESOPHAGOGASTRODUODENOSCOPY (EGD);  Surgeon: Garlan Fair, MD;  Location: [facility];  Service: Endoscopy;  Laterality: N/A;  . ESOPHAGOGASTRODUODENOSCOPY (EGD) WITH PROPOFOL N/A 05/14/2014   Procedure: ESOPHAGOGASTRODUODENOSCOPY (EGD) WITH PROPOFOL;  Surgeon: Garlan Fair, MD;  [facility];  Service: Endoscopy;  Laterality: N/A;  . IR ANGIO VERTEBRAL SEL SUBCLAVIAN INNOMINATE UNI R MOD SED  06/03/2017  . IR GASTROSTOMY TUBE MOD SED  06/25/2017  . IR GASTROSTOMY TUBE REMOVAL  08/20/2017  . IR PERCUTANEOUS ART THROMBECTOMY/INFUSION INTRACRANIAL INC DIAG ANGIO  06/03/2017  . IR PERCUTANEOUS ART THROMBECTOMY/INFUSION INTRACRANIAL INC DIAG ANGIO  06/03/2017  . IR RADIOLOGIST EVAL & MGMT  09/07/2017  . LUMBAR PERITONEAL SHUNT     x 2  . OVARIAN CYST REMOVAL     age 66  . RADIOLOGY WITH ANESTHESIA N/A 06/03/2017   Procedure: RADIOLOGY WITH ANESTHESIA;  Surgeon:  Luanne Bras, MD;  Location: [facility];  Service: Radiology;  Laterality: N/A;  . SHUNT REVISION  2007  . TOTAL ABDOMINAL HYSTERECTOMY W/ BILATERAL SALPINGOOPHORECTOMY  1994   age 66s  . WRIST SURGERY Left 2014    [name]    There were no vitals filed for this visit.  Subjective Assessment - 09/14/17 1020    Subjective  I want to be able to see if I can talk a little better, say what I want to say....and get numbers - I can't figure them out.    Patient is accompained by:  Family member husband, dennis    Currently in Pain?  Yes    Pain  Score  7     Pain Location  Arm    Pain Orientation  Right    Pain Descriptors / Indicators  Sharp    Pain Type  Acute pain    Pain Onset  1 to 4 weeks ago injury    Pain Frequency  Constant    Aggravating Factors   movement    Pain Relieving Factors  rest    Multiple Pain Sites  No            ADULT SLP TREATMENT - 09/14/17 0001      General Information   Behavior/Cognition  Alert;Cooperative;Pleasant mood      Treatment Provided   Treatment provided  Dysphagia;Cognitive-Linquistic      Dysphagia Treatment   Temperature Spikes Noted  No    Respiratory Status  Room air    Oral Cavity - Dentition  Edentulous pt reports she has dentures, but has lost too much weight    Treatment Methods  Patient/caregiver education      Pain Assessment   Pain Assessment  0-10    Pain Score  7     Pain Location  right arm    Pain Descriptors / Indicators  Sharp    Pain Intervention(s)  Monitored during session      Cognitive-Linquistic Treatment   Treatment focused on  Aphasia;Patient/family/caregiver education    Skilled Treatment  ST facilitated skilled session by reviewing goals established after initial evaluation, discussing tolerance of current diet, and review of abdominal breathing. The [place] Cognitive Assessment (MoCA) was administered per goal for further evaluation. Pt scored 20/27 - visuoperceptual subtest not given, due to pt report of blurry/double vision. Points were lost on thought organization/naming task, calculations, and delayed recall.  Pt has a college education in elementary education. Diaphragmatic breathing exercises were provided to facilitate proper breath support and increased loudness. Pt reports no difficulty swallowing solids or liquids, and tolerates whole pills with liquid. She reports cutting one large pill in half and taking it with liquid.      Assessment / Recommendations / Plan   Plan  Continue with current plan of care      Dysphagia Recommendations    Diet recommendations  Dysphagia 3 (mechanical soft);Thin liquid    Liquids provided via  Cup;Straw    Medication Administration  Whole meds with liquid break large pills    Supervision  Patient able to self feed    Compensations  Slow rate;Small sips/bites    Postural Changes and/or Swallow Maneuvers  Seated upright 90 degrees      Progression Toward Goals   Progression toward goals  Progressing toward goals       SLP Education - 09/14/17 1251    Education provided  Yes    Education Details  MoCA results, deficits  noted, safe swallow precautions, pt/husband encouraged to bring calendar to facilitate establishment of routines    Person(s) Educated  Patient    Methods  Explanation;Verbal cues    Comprehension  Verbalized understanding;Need further instruction       SLP Short Term Goals - 09/14/17 1257      SLP SHORT TERM GOAL #1   Title  pt will demo selective attention for 10 minutes in min noisy environment in simple cognitive linguistic task over three sessions    Time  3    Period  Weeks    Status  On-going      SLP SHORT TERM GOAL #2   Title  pt will tell SLP 3 non-physical deficits without cues    Time  3    Period  Weeks    Status  On-going      SLP SHORT TERM GOAL #3   Title  Pt will tell SLP 4 memory strategies with occasional min A    Time  3    Period  Weeks    Status  On-going      SLP SHORT TERM GOAL #4   Title  pt will perform safe swallow strategies with POs, given occasional min A over 3 sessions    Time  3    Period  Weeks    Status  On-going      SLP SHORT TERM GOAL #5   Title  pt will demo HEP for swallowing with occasional min A    Time  3    Period  Weeks    Status  On-going       SLP Long Term Goals - 09/14/17 1258      SLP LONG TERM GOAL #1   Title  pt will alternate attention with simple-mod complex cognitive linguistic tasks with at least 90% overall with self correction allowed, over three sessions    Time  7    Period  Weeks     Status  On-going      SLP LONG TERM GOAL #2   Title  pt will demo emergent awareness by self correcting 100% of errors on simple-mod complec linguistic tasks with verbal cues, rarely    Time  7    Period  Weeks    Status  On-going      SLP LONG TERM GOAL #3   Title  pt will utilize a memory strategy in 5 ST sessions with nonverbal cues    Time  7    Period  Weeks    Status  On-going      SLP LONG TERM GOAL #4   Title  pt will perform swallow HEP with modified independence    Time  7    Period  Weeks    Status  On-going       Plan - 09/14/17 1253    Clinical Impression Statement  Pt cooperative with unfamiliar therapist. MoCA administration revealed score of 20/27, with deficits noted in word finding/verbal fluency, calculations, and delayed recall. Pt's spouse notes decreased loudness at home. Diaphragmatic breathing exercises were provided to facilitate breath support for loudnes. Pt reports no ongoing difficulty swallowing, and is tolerating regular consistency solids and thin liquids. Spouse cuts food for her, given right arm pain. Pt reports a goal to resume self medication, that her daughter currently manages pts meds. Continued ST intervention is recommended to maximize cognitive-linguistic skills, independence, and QOL.     Speech Therapy Frequency  2x / week  Duration  -- 8 weeks    Treatment/Interventions  Aspiration precaution training;Pharyngeal strengthening exercises;Diet toleration management by SLP;Environmental controls;Compensatory techniques;Trials of upgraded texture/liquids;Internal/external aids;Cognitive reorganization;Cueing hierarchy;SLP instruction and feedback;Functional tasks;Patient/family education    Potential to Achieve Goals  Good    Potential Considerations  Ability to learn/carryover information;Cooperation/participation level;Family/community support    Consulted and Agree with Plan of Care  Patient;Family member/caregiver    Family Member Consulted   husband dennis       Patient will benefit from skilled therapeutic intervention in order to improve the following deficits and impairments:   Cognitive communication deficit  Dysphagia, oropharyngeal phase    Problem List Patient Active Problem List   Diagnosis Date Noted  . Adjustment disorder with mixed anxiety and depressed mood   . Hemoptysis   . Hematemesis   . Intractable vomiting with nausea   . Acute lower UTI   . Urinary retention   . Diabetic gastroparesis ([place])   . Vascular headache   . Diabetes mellitus ([place])   . CVA (cerebral vascular accident) ([place]) 06/18/2017  . Weakness   . 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 Right hemiparesis ([place])   . DNR (do not resuscitate) discussion 06/16/2017  . Palliative care by specialist 06/16/2017  . Anxiety state   . Acute embolic stroke ([place])   . Benign essential HTN   . Diabetic peripheral neuropathy ([place])   . Pseudotumor cerebri   . Hypokalemia   . Seizure ([place])   . Leukocytosis   . Acute blood loss anemia   . Post-operative pain   . Encounter for feeding tube placement   . Respiratory failure ([place])   . Status post tracheostomy ([place])   . Acute respiratory failure with hypoxemia ([place])   . Stenosis of left carotid artery   . Acute CVA (cerebrovascular accident) ([place]) 05/31/2017  . Type II diabetes mellitus with complication ([place]) 13/24/4010  . Hypertension 05/31/2017  . Hyponatremia 05/31/2017  . IIH (idiopathic intracranial hypertension) 01/18/2016  . Malfunction of ventriculo-peritoneal shunt ([place]) 01/18/2016  . 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 Worsening headaches 01/18/2016  . Increased abdominal girth 01/18/2016  . Abdominal fluid collection 01/18/2016  . Vision changes 01/18/2016  . Perceived hearing changes 01/18/2016  . Nausea without vomiting 01/18/2016  . Peripheral vision loss 01/18/2016  . Imbalance 01/18/2016  . Falls 01/18/2016  . Dysphagia, oropharyngeal 01/18/2016  .  Subacute confusional state 01/18/2016  . Left breast mass 05/05/2011   Celia B. Quentin [facility], CCC-SLP Speech Language Pathologist  Shonna Chock 09/14/2017, 12:59 PM  [facility] [address] [place], [place], [address] Phone: [phone]   Fax:  [phone]   Name: NAIRA STANDIFORD MRN: [id number] Date of Birth: [birth year]

## 2017-09-15 ENCOUNTER — Encounter: Payer: Medicare Other | Admitting: Occupational Therapy

## 2017-09-16 ENCOUNTER — Encounter: Payer: Self-pay | Admitting: Speech Pathology

## 2017-09-16 ENCOUNTER — Other Ambulatory Visit: Payer: Self-pay

## 2017-09-16 ENCOUNTER — Encounter: Payer: Medicare Other | Admitting: Occupational Therapy

## 2017-09-16 ENCOUNTER — Ambulatory Visit: Payer: Medicare Other | Admitting: Speech Pathology

## 2017-09-16 ENCOUNTER — Ambulatory Visit: Payer: Medicare Other

## 2017-09-16 DIAGNOSIS — M6281 Muscle weakness (generalized): Secondary | ICD-10-CM | POA: Diagnosis not present

## 2017-09-16 DIAGNOSIS — R41841 Cognitive communication deficit: Secondary | ICD-10-CM

## 2017-09-16 NOTE — Patient Instructions (Signed)

## 2017-09-16 NOTE — Therapy (Signed)
[facility] [address] [place], [place], [address] Phone: [phone]   Fax:  [phone]  Speech Language Pathology Treatment  Patient Details  Name: [name] MRN: [id number] Date of Birth: 1950/11/15 Referring Provider: Marisa Sprinkles   Encounter Date: 09/16/2017  End of Session - 09/16/17 1725    Visit Number  3    Number of Visits  17    Date for SLP Re-Evaluation  11/05/17    SLP Start Time  1535    SLP Stop Time   65    SLP Time Calculation (min)  40 min    Activity Tolerance  Patient tolerated treatment well       Past Medical History:  Diagnosis Date  . Anemia    takes iron supplement  . Anesthesia complication    disorientation due to pseudotumor  . Diabetes mellitus    IDDM  . Family history of anesthesia complication 40 yrs ago   brother stopped breathing for a minute or two  . GERD (gastroesophageal reflux disease)   . Headache(784.0)    due to pseudotumor; daily headache  . Hypercholesteremia    unable to tolerate statins  . Hypertension    under control with meds., has been on med. x 5 yr.  . Peripheral neuropathy   . PONV (postoperative nausea and vomiting)   . 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 Seizures ([place])    due to shunt failure; last seizure 2007  . Synovitis of ankle 04/2013   left  . Wears dentures    full    Past Surgical History:  Procedure Laterality Date  . ANKLE ARTHROSCOPY Left 05/18/2013   Procedure: LEFT ANKLE ARTHROSCOPY WITH DEBRIDEMENT,  SUBTALAR OPEN DEBRIDEMENT ;  Surgeon: Wylene Simmer, MD;  Location: [facility];  Service: Orthopedics;  Laterality: Left;  . APPENDECTOMY     as a child  . BALLOON DILATION N/A 07/04/2013   Procedure: BALLOON DILATION;  Surgeon: Garlan Fair, MD;  Location: [facility];  Service: Endoscopy;  Laterality: N/A;  . BLADDER SUSPENSION    . BREAST LUMPECTOMY W/ NEEDLE LOCALIZATION Left  05/22/2011  . CAROTID ANGIOGRAPHY Bilateral 06/03/2017   Procedure: Bilateral Carotid Angiography;  Surgeon: Conrad [place], MD;  Location: [facility];  Service: Cardiovascular;  Laterality: Bilateral;  . COLONOSCOPY WITH PROPOFOL N/A 05/14/2014   Procedure: COLONOSCOPY WITH PROPOFOL;  Surgeon: Garlan Fair, MD;  [facility];  Service: Endoscopy;  Laterality: N/A;  . ESOPHAGEAL DILATION  06/23/2006; 08/05/2004  . ESOPHAGOGASTRODUODENOSCOPY N/A 07/04/2013   Procedure: ESOPHAGOGASTRODUODENOSCOPY (EGD);  Surgeon: Garlan Fair, MD;  Location: [facility];  Service: Endoscopy;  Laterality: N/A;  . ESOPHAGOGASTRODUODENOSCOPY (EGD) WITH PROPOFOL N/A 05/14/2014   Procedure: ESOPHAGOGASTRODUODENOSCOPY (EGD) WITH PROPOFOL;  Surgeon: Garlan Fair, MD;  [facility];  Service: Endoscopy;  Laterality: N/A;  . IR ANGIO VERTEBRAL SEL SUBCLAVIAN INNOMINATE UNI R MOD SED  06/03/2017  . IR GASTROSTOMY TUBE MOD SED  06/25/2017  . IR GASTROSTOMY TUBE REMOVAL  08/20/2017  . IR PERCUTANEOUS ART THROMBECTOMY/INFUSION INTRACRANIAL INC DIAG ANGIO  06/03/2017  . IR PERCUTANEOUS ART THROMBECTOMY/INFUSION INTRACRANIAL INC DIAG ANGIO  06/03/2017  . IR RADIOLOGIST EVAL & MGMT  09/07/2017  . LUMBAR PERITONEAL SHUNT     x 2  . OVARIAN CYST REMOVAL     age 68  . RADIOLOGY WITH ANESTHESIA N/A 06/03/2017   Procedure: RADIOLOGY WITH ANESTHESIA;  Surgeon:  Luanne Bras, MD;  Location: [facility];  Service: Radiology;  Laterality: N/A;  . SHUNT REVISION  2007  . TOTAL ABDOMINAL HYSTERECTOMY W/ BILATERAL SALPINGOOPHORECTOMY  1994   age 81s  . WRIST SURGERY Left 2014    [name]    There were no vitals filed for this visit.  Subjective Assessment - 09/16/17 1541    Subjective  "She gave me a bunch of numbers to look at."    Currently in Pain?  Yes    Pain Score  6     Pain Location  Arm    Pain Orientation  Right    Pain Type  Acute pain    Pain Onset  1 to 4 weeks ago    Pain Relieving  Factors  tylenol            ADULT SLP TREATMENT - 09/16/17 1544      General Information   Behavior/Cognition  Alert;Cooperative;Pleasant mood    Patient Positioning  Upright in chair      Treatment Provided   Treatment provided  Cognitive-Linquistic      Cognitive-Linquistic Treatment   Treatment focused on  Cognition    Skilled Treatment  SLP targeted cognition during today's session. Pt told SLP today several deficits without cues including "math and memory," "difficulty understanding instructions" and "remembering things just talked about." Pt and her daughter report visual impairment; they do report keeping a calendar in a central location in the home with large print which the pt is able to view. Daughter is also living at home as pt's husband recently diagnosed with possible early onset Alzheimer's disease. Pt unable to state strategies for recall. SLP educated and provided handout re: WARM strategies. Trained pt in use of repetition and mental imaging in functional working memory tasks. In task adding 3-5 coins (presented verbally), pt required usual min A for repetition and was able to solve 75% of problems accurately with extended time. Approximately 8 minutes into task, pt's attention interfering. Simple verbal 3 word manipulation task with frequent min A for repetition and mental imaging, 75% accuracy. Home tasks assigned.      Assessment / Recommendations / Plan   Plan  Continue with current plan of care      Progression Toward Goals   Progression toward goals  Progressing toward goals       SLP Education - 09/16/17 1725    Education provided  Yes    Education Details  WARM memory strategies    Person(s) Educated  Patient;Child(ren)    Methods  Explanation;Verbal cues;Handout    Comprehension  Verbalized understanding;Returned demonstration;Verbal cues required;Need further instruction       SLP Short Term Goals - 09/16/17 1544      SLP SHORT TERM GOAL #1    Title  pt will demo selective attention for 10 minutes in min noisy environment in simple cognitive linguistic task over three sessions    Time  3    Period  Weeks    Status  On-going      SLP SHORT TERM GOAL #2   Title  pt will tell SLP 3 non-physical deficits without cues    Baseline  09/16/17    Time  3    Period  Weeks    Status  On-going      SLP SHORT TERM GOAL #3   Title  Pt will tell SLP 4 memory strategies with occasional min A    Time  3  Period  Weeks    Status  On-going      SLP SHORT TERM GOAL #4   Title  pt will perform safe swallow strategies with POs, given occasional min A over 3 sessions    Time  3    Period  Weeks    Status  On-going      SLP SHORT TERM GOAL #5   Title  pt will demo HEP for swallowing with occasional min A    Time  3    Period  Weeks    Status  On-going       SLP Long Term Goals - 09/16/17 1716      SLP LONG TERM GOAL #1   Title  pt will alternate attention with simple-mod complex cognitive linguistic tasks with at least 90% overall with self correction allowed, over three sessions    Time  7    Period  Weeks    Status  On-going      SLP LONG TERM GOAL #2   Title  pt will demo emergent awareness by self correcting 100% of errors on simple-mod complec linguistic tasks with verbal cues, rarely    Time  7    Period  Weeks    Status  On-going      SLP LONG TERM GOAL #3   Title  pt will utilize a memory strategy in 5 ST sessions with nonverbal cues    Time  7    Period  Weeks    Status  On-going      SLP LONG TERM GOAL #4   Title  pt will perform swallow HEP with modified independence    Time  7    Period  Weeks    Status  On-going       Plan - 09/16/17 1726    Clinical Impression Statement  Pt cooperative with unfamiliar therapist. Deficits seen in sustained attention, working and auditory memory, as well as decreased frustration tolerance today. Pt reports no ongoing difficulty swallowing, and is tolerating regular  consistency solids and thin liquids. Spouse cuts food for her, given right arm pain. Continued ST intervention is recommended to maximize cognitive-linguistic skills, independence, and QOL.     Speech Therapy Frequency  2x / week    Treatment/Interventions  Aspiration precaution training;Pharyngeal strengthening exercises;Diet toleration management by SLP;Environmental controls;Compensatory techniques;Trials of upgraded texture/liquids;Internal/external aids;Cognitive reorganization;Cueing hierarchy;SLP instruction and feedback;Functional tasks;Patient/family education    Potential to Achieve Goals  Good    Potential Considerations  Ability to learn/carryover information;Cooperation/participation level;Family/community support    Consulted and Agree with Plan of Care  Patient;Family member/caregiver    Family Member Consulted  daughter [name]       Patient will benefit from skilled therapeutic intervention in order to improve the following deficits and impairments:   Cognitive communication deficit    Problem List Patient Active Problem List   Diagnosis Date Noted  . Adjustment disorder with mixed anxiety and depressed mood   . Hemoptysis   . Hematemesis   . Intractable vomiting with nausea   . Acute lower UTI   . Urinary retention   . Diabetic gastroparesis ([place])   . Vascular headache   . Diabetes mellitus ([place])   . CVA (cerebral vascular accident) ([place]) 06/18/2017  . Weakness   . 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 Right hemiparesis ([place])   . DNR (do not resuscitate) discussion 06/16/2017  . Palliative care by specialist 06/16/2017  .  Anxiety state   . Acute embolic stroke ([place])   . Benign essential HTN   . Diabetic peripheral neuropathy ([place])   . Pseudotumor cerebri   . Hypokalemia   . Seizure ([place])   . Leukocytosis   . Acute blood loss anemia   . Post-operative pain   . Encounter for feeding tube placement   . Respiratory failure ([place])    . Status post tracheostomy ([place])   . Acute respiratory failure with hypoxemia ([place])   . Stenosis of left carotid artery   . Acute CVA (cerebrovascular accident) ([place]) 05/31/2017  . Type II diabetes mellitus with complication ([place]) 35/59/7416  . Hypertension 05/31/2017  . Hyponatremia 05/31/2017  . IIH (idiopathic intracranial hypertension) 01/18/2016  . Malfunction of ventriculo-peritoneal shunt ([place]) 01/18/2016  . 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 Worsening headaches 01/18/2016  . Increased abdominal girth 01/18/2016  . Abdominal fluid collection 01/18/2016  . Vision changes 01/18/2016  . Perceived hearing changes 01/18/2016  . Nausea without vomiting 01/18/2016  . Peripheral vision loss 01/18/2016  . Imbalance 01/18/2016  . Falls 01/18/2016  . Dysphagia, oropharyngeal 01/18/2016  . Subacute confusional state 01/18/2016  . Left breast mass 05/05/2011   Deneise Lever, [place], [place] 09/16/2017, 5:32 PM  [facility] [address] [place], [place], [address] Phone: [phone]   Fax:  [phone]   Name: ITATI BROCKSMITH MRN: [id number] Date of Birth: [birth year]

## 2017-09-22 ENCOUNTER — Encounter: Payer: Medicare Other | Admitting: Occupational Therapy

## 2017-09-22 ENCOUNTER — Telehealth: Payer: Self-pay | Admitting: Rehabilitative and Restorative Service Providers"

## 2017-09-22 ENCOUNTER — Ambulatory Visit: Payer: Medicare Other | Admitting: Rehabilitative and Restorative Service Providers"

## 2017-09-22 NOTE — Telephone Encounter (Signed)
-----   Message from Primitivo Gauze, [place] sent at 09/07/2017 11:37 AM EST ----- Patient's daughter cancelled all of her PT and OT appointments for the next 3 weeks. She has a sprained wrist and her doctor wants her to keep it immobile.   Thanks [employer]

## 2017-09-23 ENCOUNTER — Other Ambulatory Visit: Payer: Self-pay

## 2017-09-24 ENCOUNTER — Ambulatory Visit: Payer: Medicare Other

## 2017-09-24 ENCOUNTER — Ambulatory Visit: Payer: Medicare Other | Admitting: Rehabilitative and Restorative Service Providers"

## 2017-09-24 ENCOUNTER — Encounter: Payer: Medicare Other | Admitting: Occupational Therapy

## 2017-09-28 ENCOUNTER — Encounter: Payer: Medicare Other | Admitting: Occupational Therapy

## 2017-09-28 ENCOUNTER — Encounter: Payer: Medicare Other | Admitting: *Deleted

## 2017-09-28 ENCOUNTER — Ambulatory Visit: Payer: Medicare Other

## 2017-09-28 ENCOUNTER — Other Ambulatory Visit: Payer: Self-pay

## 2017-09-30 ENCOUNTER — Encounter: Payer: Medicare Other | Admitting: Speech Pathology

## 2017-09-30 ENCOUNTER — Encounter: Payer: Medicare Other | Admitting: Occupational Therapy

## 2017-09-30 ENCOUNTER — Ambulatory Visit: Payer: Medicare Other | Admitting: Rehabilitative and Restorative Service Providers"

## 2017-10-05 ENCOUNTER — Ambulatory Visit: Payer: Medicare Other

## 2017-10-05 ENCOUNTER — Ambulatory Visit: Payer: Medicare Other | Attending: Neurology

## 2017-10-05 ENCOUNTER — Other Ambulatory Visit: Payer: Self-pay

## 2017-10-05 DIAGNOSIS — R1312 Dysphagia, oropharyngeal phase: Secondary | ICD-10-CM | POA: Insufficient documentation

## 2017-10-05 DIAGNOSIS — R41841 Cognitive communication deficit: Secondary | ICD-10-CM | POA: Diagnosis present

## 2017-10-06 NOTE — Patient Instructions (Signed)
Bring swallowing exercises with you next session.

## 2017-10-06 NOTE — Therapy (Signed)
[facility] [address] [place], [place], [address] Phone: [phone]   Fax:  [phone]  Speech Language Pathology Treatment  Patient Details  Name: [name] MRN: [id number] Date of Birth: 1950/10/26 Referring Provider: Marisa Sprinkles   Encounter Date: 10/05/2017  End of Session - 10/06/17 1707    Visit Number  4    Number of Visits  17    Date for SLP Re-Evaluation  11/05/17    SLP Start Time  6734    SLP Stop Time   1402    SLP Time Calculation (min)  45 min    Activity Tolerance  Patient tolerated treatment well       Past Medical History:  Diagnosis Date  . Anemia    takes iron supplement  . Anesthesia complication    disorientation due to pseudotumor  . Diabetes mellitus    IDDM  . Family history of anesthesia complication 47 yrs ago   brother stopped breathing for a minute or two  . GERD (gastroesophageal reflux disease)   . Headache(784.0)    due to pseudotumor; daily headache  . Hypercholesteremia    unable to tolerate statins  . Hypertension    under control with meds., has been on med. x 5 yr.  . Peripheral neuropathy   . PONV (postoperative nausea and vomiting)   . 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 Seizures ([place])    due to shunt failure; last seizure 2007  . Synovitis of ankle 04/2013   left  . Wears dentures    full    Past Surgical History:  Procedure Laterality Date  . ANKLE ARTHROSCOPY Left 05/18/2013   Procedure: LEFT ANKLE ARTHROSCOPY WITH DEBRIDEMENT,  SUBTALAR OPEN DEBRIDEMENT ;  Surgeon: Wylene Simmer, MD;  Location: [facility];  Service: Orthopedics;  Laterality: Left;  . APPENDECTOMY     as a child  . BALLOON DILATION N/A 07/04/2013   Procedure: BALLOON DILATION;  Surgeon: Garlan Fair, MD;  [facility];  Service: Endoscopy;  Laterality: N/A;  . BLADDER SUSPENSION    . BREAST LUMPECTOMY W/ NEEDLE LOCALIZATION Left  05/22/2011  . CAROTID ANGIOGRAPHY Bilateral 06/03/2017   Procedure: Bilateral Carotid Angiography;  Surgeon: Conrad Bethalto, MD;  Location: [facility];  Service: Cardiovascular;  Laterality: Bilateral;  . COLONOSCOPY WITH PROPOFOL N/A 05/14/2014   Procedure: COLONOSCOPY WITH PROPOFOL;  Surgeon: Garlan Fair, MD;  [facility];  Service: Endoscopy;  Laterality: N/A;  . ESOPHAGEAL DILATION  06/23/2006; 08/05/2004  . ESOPHAGOGASTRODUODENOSCOPY N/A 07/04/2013   Procedure: ESOPHAGOGASTRODUODENOSCOPY (EGD);  Surgeon: Garlan Fair, MD;  Location: [facility];  Service: Endoscopy;  Laterality: N/A;  . ESOPHAGOGASTRODUODENOSCOPY (EGD) WITH PROPOFOL N/A 05/14/2014   Procedure: ESOPHAGOGASTRODUODENOSCOPY (EGD) WITH PROPOFOL;  Surgeon: Garlan Fair, MD;  [facility];  Service: Endoscopy;  Laterality: N/A;  . IR ANGIO VERTEBRAL SEL SUBCLAVIAN INNOMINATE UNI R MOD SED  06/03/2017  . IR GASTROSTOMY TUBE MOD SED  06/25/2017  . IR GASTROSTOMY TUBE REMOVAL  08/20/2017  . IR PERCUTANEOUS ART THROMBECTOMY/INFUSION INTRACRANIAL INC DIAG ANGIO  06/03/2017  . IR PERCUTANEOUS ART THROMBECTOMY/INFUSION INTRACRANIAL INC DIAG ANGIO  06/03/2017  . IR RADIOLOGIST EVAL & MGMT  09/07/2017  . LUMBAR PERITONEAL SHUNT     x 2  . OVARIAN CYST REMOVAL     age 66  . RADIOLOGY WITH ANESTHESIA N/A 06/03/2017   Procedure: RADIOLOGY WITH ANESTHESIA;  Surgeon:  Luanne Bras, MD;  Location: [facility];  Service: Radiology;  Laterality: N/A;  . SHUNT REVISION  2007  . TOTAL ABDOMINAL HYSTERECTOMY W/ BILATERAL SALPINGOOPHORECTOMY  1994   age 66s  . WRIST SURGERY Left 2014    [name]    There were no vitals filed for this visit.  Subjective Assessment - 10/05/17 1405    Subjective  Pt recalled OT and PT are on hold. "I figured out what I did wrong, I just told you the ones I didn't remember before." (re: [employer] Verbal Learning Test)    Patient is accompained by:  Family member husband             ADULT SLP TREATMENT - 10/06/17 0001      General Information   Behavior/Cognition  Alert;Cooperative;Pleasant mood    Patient Positioning  Upright in chair      Treatment Provided   Treatment provided  Dysphagia;Cognitive-Linquistic      Dysphagia Treatment   Other treatment/comments  Pt reports eating fish, pasta, chinese food, hamburgers and other soft meats, pork chops. She performs what she reports as "my exercises" approx 3 times per week as previous SLP told her to wean off as her swallowing improved.Marland Kitchen SLP told pt to bring exercises next visit, and that she needed to complete every day. Her description/recall (?) of swallowing exercises was not optimal for SLP to know which exercises pt had been performing at home. SLP asked pt to bring HEP to next session.     Cognitive-Linquistic Treatment   Treatment focused on  Cognition    Skilled Treatment  SLP faciliated pt's memory compensations today by reviewing WARM memory strategies. Pt adamant these were not done last visit. SLP reviewed each and pt cont to tell SLP they were not done last visit, however last SLP note states specifically compensations were discussed in session adn a handout was provided. Pt adamant that handout was not provided except homework. Given pt's "s" statement, SLP initiated MOCA-Blind and pt with mild difficulty with 5-word recall, unquestionable difficulty with immediate recall, and working memory (number recall in reverse order, months in reverse order - pt endorsed the need to say months forward to get the previous month). SLP unable to complete entire assessment - will cont next session.       Assessment / Recommendations / Plan   Plan  Continue with current plan of care      Progression Toward Goals   Progression toward goals  Progressing toward goals       SLP Education - 10/06/17 1707    Education provided  Yes    Education Details  WARM memory strategies (re-education)    Person(s)  Educated  Patient;Spouse    Methods  Explanation;Demonstration    Comprehension  Verbalized understanding       SLP Short Term Goals - 10/06/17 1712      SLP SHORT TERM GOAL #1   Title  pt will demo selective attention for 10 minutes in min noisy environment in simple cognitive linguistic task over three sessions    Time  2    Period  Weeks    Status  On-going      SLP SHORT TERM GOAL #2   Title  pt will tell SLP 3 non-physical deficits without cues    Baseline  09/16/17    Time  2    Period  Weeks    Status  On-going      SLP SHORT TERM GOAL #  3   Title  Pt will tell SLP 4 memory strategies with occasional min A    Time  2    Period  Weeks    Status  On-going      SLP SHORT TERM GOAL #4   Title  pt will perform safe swallow strategies with POs, given occasional min A over 3 sessions    Time  2    Period  Weeks    Status  On-going      SLP SHORT TERM GOAL #5   Title  pt will demo HEP for swallowing with occasional min A    Time  2    Period  Weeks    Status  On-going       SLP Long Term Goals - 10/06/17 1712      SLP LONG TERM GOAL #1   Title  pt will alternate attention with simple-mod complex cognitive linguistic tasks with at least 90% overall with self correction allowed, over three sessions    Time  6    Period  Weeks    Status  On-going      SLP LONG TERM GOAL #2   Title  pt will demo emergent awareness by self correcting 100% of errors on simple-mod complec linguistic tasks with verbal cues, rarely    Time  6    Period  Weeks    Status  On-going      SLP LONG TERM GOAL #3   Title  pt will utilize a memory strategy in 5 ST sessions with nonverbal cues    Time  6    Period  Weeks    Status  On-going      SLP LONG TERM GOAL #4   Title  pt will perform swallow HEP with modified independence    Time  6    Period  Weeks    Status  On-going       Plan - 10/06/17 1709    Clinical Impression Statement  Pt's deficits seen in working and auditory  memory; pt cont to suffer from decreased frustration tolerance today given her adamancy about memory strategies (see "skilled intervention" for details). Pt was unable to adquately provide description of swallowing exercises she had been performing at home, reportedly twice a week. ? need for f/u swallowing assessment. Continued ST intervention is recommended to maximize cognitive-linguistic skills, independence, and QOL.     Speech Therapy Frequency  2x / week    Treatment/Interventions  Aspiration precaution training;Pharyngeal strengthening exercises;Diet toleration management by SLP;Environmental controls;Compensatory techniques;Trials of upgraded texture/liquids;Internal/external aids;Cognitive reorganization;Cueing hierarchy;SLP instruction and feedback;Functional tasks;Patient/family education    Potential to Achieve Goals  Good    Potential Considerations  Ability to learn/carryover information;Cooperation/participation level;Family/community support    Consulted and Agree with Plan of Care  Patient;Family member/caregiver    Family Member Consulted  daughter [name]       Patient will benefit from skilled therapeutic intervention in order to improve the following deficits and impairments:   Cognitive communication deficit  Dysphagia, oropharyngeal phase    Problem List Patient Active Problem List   Diagnosis Date Noted  . Adjustment disorder with mixed anxiety and depressed mood   . Hemoptysis   . Hematemesis   . Intractable vomiting with nausea   . Acute lower UTI   . Urinary retention   . Diabetic gastroparesis ([place])   . Vascular headache   . Diabetes mellitus ([place])   . CVA (cerebral vascular accident) ([place]) 06/18/2017  .  Weakness   . 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 Right hemiparesis ([place])   . DNR (do not resuscitate) discussion 06/16/2017  . Palliative care by specialist 06/16/2017  . Anxiety state   . Acute embolic stroke ([place])    . Benign essential HTN   . Diabetic peripheral neuropathy ([place])   . Pseudotumor cerebri   . Hypokalemia   . Seizure ([place])   . Leukocytosis   . Acute blood loss anemia   . Post-operative pain   . Encounter for feeding tube placement   . Respiratory failure ([place])   . Status post tracheostomy ([place])   . Acute respiratory failure with hypoxemia ([place])   . Stenosis of left carotid artery   . Acute CVA (cerebrovascular accident) ([place]) 05/31/2017  . Type II diabetes mellitus with complication ([place]) 09/62/8366  . Hypertension 05/31/2017  . Hyponatremia 05/31/2017  . IIH (idiopathic intracranial hypertension) 01/18/2016  . Malfunction of ventriculo-peritoneal shunt ([place]) 01/18/2016  . 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 Worsening headaches 01/18/2016  . Increased abdominal girth 01/18/2016  . Abdominal fluid collection 01/18/2016  . Vision changes 01/18/2016  . Perceived hearing changes 01/18/2016  . Nausea without vomiting 01/18/2016  . Peripheral vision loss 01/18/2016  . Imbalance 01/18/2016  . Falls 01/18/2016  . Dysphagia, oropharyngeal 01/18/2016  . Subacute confusional state 01/18/2016  . Left breast mass 05/05/2011    [facility] ,MS, CCC-SLP  10/06/2017, 5:13 PM  [facility] [address] [place], [place], [address] Phone: [phone]   Fax:  [phone]   Name: SHELLA LAHMAN MRN: [id number] Date of Birth: 1951-04-08

## 2017-10-07 ENCOUNTER — Ambulatory Visit: Payer: Medicare Other

## 2017-10-07 ENCOUNTER — Ambulatory Visit: Payer: Medicare Other | Admitting: Rehabilitative and Restorative Service Providers"

## 2017-10-13 ENCOUNTER — Ambulatory Visit: Payer: Medicare Other | Admitting: Rehabilitative and Restorative Service Providers"

## 2017-10-14 ENCOUNTER — Ambulatory Visit: Payer: Medicare Other | Admitting: Speech Pathology

## 2017-10-15 ENCOUNTER — Ambulatory Visit: Payer: Medicare Other

## 2017-10-20 ENCOUNTER — Ambulatory Visit: Payer: Medicare Other | Admitting: Physical Therapy

## 2017-10-20 ENCOUNTER — Other Ambulatory Visit: Payer: Self-pay

## 2017-10-20 ENCOUNTER — Ambulatory Visit: Payer: Medicare Other | Attending: Neurology

## 2017-10-20 DIAGNOSIS — R41841 Cognitive communication deficit: Secondary | ICD-10-CM | POA: Insufficient documentation

## 2017-10-20 DIAGNOSIS — R1312 Dysphagia, oropharyngeal phase: Secondary | ICD-10-CM | POA: Diagnosis present

## 2017-10-20 NOTE — Therapy (Signed)
[facility] [address] [place], [place], [address] Phone: [phone]   Fax:  [phone]  Speech Language Pathology Treatment  Patient Details  Name: [name] MRN: [id number] Date of Birth: 03-Oct-1951 Referring Provider: Marisa Sprinkles   Encounter Date: 10/20/2017  End of Session - 10/20/17 1643    Visit Number  5    Number of Visits  17    Date for SLP Re-Evaluation  11/05/17    SLP Start Time  1150    SLP Stop Time   1230    SLP Time Calculation (min)  40 min       Past Medical History:  Diagnosis Date  . Anemia    takes iron supplement  . Anesthesia complication    disorientation due to pseudotumor  . Diabetes mellitus    IDDM  . Family history of anesthesia complication 26 yrs ago   brother stopped breathing for a minute or two  . GERD (gastroesophageal reflux disease)   . Headache(784.0)    due to pseudotumor; daily headache  . Hypercholesteremia    unable to tolerate statins  . Hypertension    under control with meds., has been on med. x 5 yr.  . Peripheral neuropathy   . PONV (postoperative nausea and vomiting)   . 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 Seizures ([place])    due to shunt failure; last seizure 2007  . Synovitis of ankle 04/2013   left  . Wears dentures    full    Past Surgical History:  Procedure Laterality Date  . ANKLE ARTHROSCOPY Left 05/18/2013   Procedure: LEFT ANKLE ARTHROSCOPY WITH DEBRIDEMENT,  SUBTALAR OPEN DEBRIDEMENT ;  Surgeon: Wylene Simmer, MD;  Location: [facility];  Service: Orthopedics;  Laterality: Left;  . APPENDECTOMY     as a child  . BALLOON DILATION N/A 07/04/2013   Procedure: BALLOON DILATION;  Surgeon: Garlan Fair, MD;  Location[facility];  Service: Endoscopy;  Laterality: N/A;  . BLADDER SUSPENSION    . BREAST LUMPECTOMY W/ NEEDLE LOCALIZATION Left 05/22/2011  . CAROTID ANGIOGRAPHY Bilateral 06/03/2017   Procedure: Bilateral Carotid Angiography;  Surgeon: Conrad Arial, MD;  Location: [facility];  Service: Cardiovascular;  Laterality: Bilateral;  . COLONOSCOPY WITH PROPOFOL N/A 05/14/2014   Procedure: COLONOSCOPY WITH PROPOFOL;  Surgeon: Garlan Fair, MD;  [facility];  Service: Endoscopy;  Laterality: N/A;  . ESOPHAGEAL DILATION  06/23/2006; 08/05/2004  . ESOPHAGOGASTRODUODENOSCOPY N/A 07/04/2013   Procedure: ESOPHAGOGASTRODUODENOSCOPY (EGD);  Surgeon: Garlan Fair, MD;  [facility];  Service: Endoscopy;  Laterality: N/A;  . ESOPHAGOGASTRODUODENOSCOPY (EGD) WITH PROPOFOL N/A 05/14/2014   Procedure: ESOPHAGOGASTRODUODENOSCOPY (EGD) WITH PROPOFOL;  Surgeon: Garlan Fair, MD;  [facility];  Service: Endoscopy;  Laterality: N/A;  . IR ANGIO VERTEBRAL SEL SUBCLAVIAN INNOMINATE UNI R MOD SED  06/03/2017  . IR GASTROSTOMY TUBE MOD SED  06/25/2017  . IR GASTROSTOMY TUBE REMOVAL  08/20/2017  . IR PERCUTANEOUS ART THROMBECTOMY/INFUSION INTRACRANIAL INC DIAG ANGIO  06/03/2017  . IR PERCUTANEOUS ART THROMBECTOMY/INFUSION INTRACRANIAL INC DIAG ANGIO  06/03/2017  . IR RADIOLOGIST EVAL & MGMT  09/07/2017  . LUMBAR PERITONEAL SHUNT     x 2  . OVARIAN CYST REMOVAL     age 70  . RADIOLOGY WITH ANESTHESIA N/A 06/03/2017   Procedure: RADIOLOGY WITH ANESTHESIA;  Surgeon: Luanne Bras, MD;  Location: [facility];  Service: Radiology;  Laterality: N/A;  . SHUNT REVISION  2007  . TOTAL ABDOMINAL HYSTERECTOMY W/ BILATERAL SALPINGOOPHORECTOMY  1994   age 8s  . WRIST SURGERY Left 2014    [name]    There were no vitals filed for this visit.  Subjective Assessment - 10/20/17 1159    Subjective  "We don't know where they are. The house is in disarray" (pt, re: swallowing HEP)    Currently in Pain?  Yes    Pain Score  6     Pain Location  Other (Comment) thumb    Pain Orientation  Right    Pain Descriptors / Indicators  Sore    Pain Type  Acute pain    Pain  Radiating Towards  rt wrist    Pain Onset  More than a month ago    Pain Frequency  Constant    Aggravating Factors   moving it    Pain Relieving Factors  meds    Effect of Pain on Daily Activities  limits mobility of rt hand/thumb            ADULT SLP TREATMENT - 10/20/17 1202      General Information   Behavior/Cognition  Alert;Cooperative;Pleasant mood      Treatment Provided   Treatment provided  Dysphagia      Dysphagia Treatment   Treatment Methods  Therapeutic exercise;Skilled observation;Patient/caregiver education    Patient observed directly with PO's  No    Other treatment/comments  Pt politely refuses follow up modified due to reporting she vomited the barium last test. Pt reports having more difficulty with bread items but has had deli meat [place] sandwiches. Pt attempts to recall swallow HEP, she and husband recall what appears to be supraglottic, Masako and what was described as lingual ROM. Pt and husband do not recall Mendelsohn, effortful swallow, Shaker, any kind of vocal adduction, or chin tuck against resistance. With exercises pt/husband recall, pt req'd min-mod A usually for proper procedure. Given results from modified (MBSS) SLP also educated pt re: bolus manipulation exercise to her HEP.       Assessment / Recommendations / Plan   Plan  Continue with current plan of care      Progression Toward Goals   Progression toward goals  Progressing toward goals         SLP Short Term Goals - 10/20/17 1645      SLP SHORT TERM GOAL #1   Title  pt will demo selective attention for 10 minutes in min noisy environment in simple cognitive linguistic task over three sessions    Time  1    Period  Weeks    Status  On-going      SLP SHORT TERM GOAL #2   Title  pt will tell SLP 3 non-physical deficits without cues    Baseline  09/16/17    Time  1    Period  Weeks    Status  On-going      SLP SHORT TERM GOAL #3   Title  Pt will tell SLP 4 memory strategies  with occasional min A    Time  1    Period  Weeks    Status  On-going      SLP SHORT TERM GOAL #4   Title  pt will perform safe swallow strategies with POs, given occasional min A over 3 sessions    Time  1    Period  Weeks    Status  On-going  SLP SHORT TERM GOAL #5   Title  pt will demo HEP for swallowing with occasional min A    Time  1    Period  Weeks    Status  On-going       SLP Long Term Goals - 10/20/17 1646      SLP LONG TERM GOAL #1   Title  pt will alternate attention with simple-mod complex cognitive linguistic tasks with at least 90% overall with self correction allowed, over three sessions    Time  5    Period  Weeks    Status  On-going      SLP LONG TERM GOAL #2   Title  pt will demo emergent awareness by self correcting 100% of errors on simple-mod complec linguistic tasks with verbal cues, rarely    Time  5    Period  Weeks    Status  On-going      SLP LONG TERM GOAL #3   Title  pt will utilize a memory strategy in 5 ST sessions with nonverbal cues    Time  5    Period  Weeks    Status  On-going      SLP LONG TERM GOAL #4   Title  pt will perform swallow HEP with modified independence    Time  5    Period  Weeks    Status  On-going       Plan - 10/20/17 1643    Clinical Impression Statement  Pt seen today for update of swallowing HEP, however, pt could not locate swallowing HEP at home. See "skilled intervention" for more detail. Pt politely declined/refused follow up modified (MBSS) due to what she recalls as a "bad experience" with the barium during her previous study. Pt will cont to be seen for cognitive linguistics as well as dysphagia.     Speech Therapy Frequency  2x / week    Duration  -- 8 weeks    Treatment/Interventions  Aspiration precaution training;Pharyngeal strengthening exercises;Diet toleration management by SLP;Environmental controls;Compensatory techniques;Trials of upgraded texture/liquids;Internal/external aids;Cognitive  reorganization;Cueing hierarchy;SLP instruction and feedback;Functional tasks;Patient/family education    Potential to Achieve Goals  Good    Potential Considerations  Ability to learn/carryover information;Cooperation/participation level;Family/community support    Consulted and Agree with Plan of Care  Patient;Family member/caregiver    Family Member Consulted  daughter [name]       Patient will benefit from skilled therapeutic intervention in order to improve the following deficits and impairments:   Cognitive communication deficit  Dysphagia, oropharyngeal phase    Problem List Patient Active Problem List   Diagnosis Date Noted  . Adjustment disorder with mixed anxiety and depressed mood   . Hemoptysis   . Hematemesis   . Intractable vomiting with nausea   . Acute lower UTI   . Urinary retention   . Diabetic gastroparesis ([place])   . Vascular headache   . Diabetes mellitus ([place])   . CVA (cerebral vascular accident) ([place]) 06/18/2017  . Weakness   . 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 Right hemiparesis ([place])   . DNR (do not resuscitate) discussion 06/16/2017  . Palliative care by specialist 06/16/2017  . Anxiety state   . Acute embolic stroke ([place])   . Benign essential HTN   . Diabetic peripheral neuropathy ([place])   . Pseudotumor cerebri   . Hypokalemia   . Seizure ([place])   . Leukocytosis   . Acute blood loss  anemia   . Post-operative pain   . Encounter for feeding tube placement   . Respiratory failure ([place])   . Status post tracheostomy ([place])   . Acute respiratory failure with hypoxemia ([place])   . Stenosis of left carotid artery   . Acute CVA (cerebrovascular accident) ([place]) 05/31/2017  . Type II diabetes mellitus with complication ([place]) 78/67/6720  . Hypertension 05/31/2017  . Hyponatremia 05/31/2017  . IIH (idiopathic intracranial hypertension) 01/18/2016  . Malfunction of ventriculo-peritoneal shunt ([place]) 01/18/2016  . Optic  atrophy 01/18/2016  . Worsening headaches 01/18/2016  . Increased abdominal girth 01/18/2016  . Abdominal fluid collection 01/18/2016  . Vision changes 01/18/2016  . Perceived hearing changes 01/18/2016  . Nausea without vomiting 01/18/2016  . Peripheral vision loss 01/18/2016  . Imbalance 01/18/2016  . Falls 01/18/2016  . Dysphagia, oropharyngeal 01/18/2016  . Subacute confusional state 01/18/2016  . Left breast mass 05/05/2011    [facility] ,MS, CCC-SLP  10/20/2017, 4:46 PM  [facility] [address] [place], [place], [address] Phone: [phone]   Fax:  [phone]   Name: [name] MRN: [id number] Date of Birth: 1951-07-07

## 2017-10-20 NOTE — Patient Instructions (Signed)
  Add a food-moving exercise to your regimen:  Put a lollipop or Twizzlers (sugar-free) in one side of your mouth and move it back and forth from side to side - 10 times (back and forth)

## 2017-10-22 ENCOUNTER — Ambulatory Visit: Payer: Medicare Other

## 2017-10-22 ENCOUNTER — Other Ambulatory Visit: Payer: Self-pay

## 2017-10-22 ENCOUNTER — Ambulatory Visit: Payer: Medicare Other | Admitting: Rehabilitative and Restorative Service Providers"

## 2017-10-22 DIAGNOSIS — R1312 Dysphagia, oropharyngeal phase: Secondary | ICD-10-CM

## 2017-10-22 DIAGNOSIS — R41841 Cognitive communication deficit: Secondary | ICD-10-CM | POA: Diagnosis not present

## 2017-10-22 NOTE — Therapy (Signed)
[facility] [address] [place], [place], [address] Phone: [phone]   Fax:  [phone]  Speech Language Pathology Treatment  Patient Details  Name: [name] MRN: [id number] Date of Birth: 18-Aug-1951 Referring Provider: Marisa Sprinkles   Encounter Date: 10/22/2017  End of Session - 10/22/17 1625    Visit Number  6    Number of Visits  17    Date for SLP Re-Evaluation  11/05/17    SLP Start Time  8921 pt 11 minutes late    SLP Stop Time   25    SLP Time Calculation (min)  33 min       Past Medical History:  Diagnosis Date  . Anemia    takes iron supplement  . Anesthesia complication    disorientation due to pseudotumor  . Diabetes mellitus    IDDM  . Family history of anesthesia complication 22 yrs ago   brother stopped breathing for a minute or two  . GERD (gastroesophageal reflux disease)   . Headache(784.0)    due to pseudotumor; daily headache  . Hypercholesteremia    unable to tolerate statins  . Hypertension    under control with meds., has been on med. x 5 yr.  . Peripheral neuropathy   . PONV (postoperative nausea and vomiting)   . 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 Synovitis of ankle 04/2013   left  . Wears dentures    full    Past Surgical History:  Procedure Laterality Date  . ANKLE ARTHROSCOPY Left 05/18/2013   Procedure: LEFT ANKLE ARTHROSCOPY WITH DEBRIDEMENT,  SUBTALAR OPEN DEBRIDEMENT ;  Surgeon: Wylene Simmer, MD;  Location: [facility];  Service: Orthopedics;  Laterality: Left;  . APPENDECTOMY     as a child  . BALLOON DILATION N/A 07/04/2013   Procedure: BALLOON DILATION;  Surgeon: Garlan Fair, MD;  Location: [facility];  Service: Endoscopy;  Laterality: N/A;  . BLADDER SUSPENSION    . BREAST LUMPECTOMY W/ NEEDLE LOCALIZATION Left 05/22/2011  . CAROTID ANGIOGRAPHY  Bilateral 06/03/2017   Procedure: Bilateral Carotid Angiography;  Surgeon: Conrad Brule, MD;  Location: [facility];  Service: Cardiovascular;  Laterality: Bilateral;  . COLONOSCOPY WITH PROPOFOL N/A 05/14/2014   Procedure: COLONOSCOPY WITH PROPOFOL;  Surgeon: Garlan Fair, MD;  Location: [facility];  Service: Endoscopy;  Laterality: N/A;  . ESOPHAGEAL DILATION  06/23/2006; 08/05/2004  . ESOPHAGOGASTRODUODENOSCOPY N/A 07/04/2013   Procedure: ESOPHAGOGASTRODUODENOSCOPY (EGD);  Surgeon: Garlan Fair, MD;  Location: [facility];  Service: Endoscopy;  Laterality: N/A;  . ESOPHAGOGASTRODUODENOSCOPY (EGD) WITH PROPOFOL N/A 05/14/2014   Procedure: ESOPHAGOGASTRODUODENOSCOPY (EGD) WITH PROPOFOL;  Surgeon: Garlan Fair, MD;  Location: [facility];  Service: Endoscopy;  Laterality: N/A;  . IR ANGIO VERTEBRAL SEL SUBCLAVIAN INNOMINATE UNI R MOD SED  06/03/2017  . IR GASTROSTOMY TUBE MOD SED  06/25/2017  . IR GASTROSTOMY TUBE REMOVAL  08/20/2017  . IR PERCUTANEOUS ART THROMBECTOMY/INFUSION INTRACRANIAL INC DIAG ANGIO  06/03/2017  . IR PERCUTANEOUS ART THROMBECTOMY/INFUSION INTRACRANIAL INC DIAG ANGIO  06/03/2017  . IR RADIOLOGIST EVAL & MGMT  09/07/2017  . LUMBAR PERITONEAL SHUNT     x 2  . OVARIAN CYST REMOVAL     age 67  . RADIOLOGY WITH ANESTHESIA N/A 06/03/2017   Procedure: RADIOLOGY WITH ANESTHESIA;  Surgeon: Luanne Bras, MD;  Location: [facility]  OR;  Service: Radiology;  Laterality: N/A;  . SHUNT REVISION  2007  . TOTAL ABDOMINAL HYSTERECTOMY W/ BILATERAL SALPINGOOPHORECTOMY  1994   age 67  . WRIST SURGERY Left 2014    [name]    There were no vitals filed for this visit.  Subjective Assessment - 10/22/17 1159    Subjective  "You should be getting a fax later on from the speech therapist - she's going to send the sheet (HEP)."    Patient is accompained by:  Family member husband    Currently in Pain?  Yes    Pain Score  6     Pain Location  -- thumb    Pain Orientation   Right    Pain Descriptors / Indicators  Burning;Tender    Pain Type  Acute pain    Pain Radiating Towards  rt wrist    Pain Onset  More than a month ago    Pain Frequency  Constant            ADULT SLP TREATMENT - 10/22/17 1202      General Information   Behavior/Cognition  Alert;Cooperative;Pleasant mood      Treatment Provided   Treatment provided  Cognitive-Linquistic      Dysphagia Treatment   Treatment Methods  Skilled observation;Compensation strategy training;Patient/caregiver education    Patient observed directly with PO's  Yes    Type of PO's observed  Dysphagia 3 (soft);Thin liquids    Oral Phase Signs & Symptoms  -- none noted (residue WNL)    Pharyngeal Phase Signs & Symptoms  -- none noted    Other treatment/comments  SLP observed pt eating cereal ("nutrigrain") bar and Coke. Pt politely refused water. No overt s/s aspiration noted. Pt's husband cut up cereal bar into slightly smaller than dice-size pieces, reports pt's bite sizes are also this big at home.       Cognitive-Linquistic Treatment   Treatment focused on  Cognition    Skilled Treatment  SLP reviewed memory strategies with pt, who did not recall any of them. SLP provided examples of each for pt/husband.      Assessment / Recommendations / Plan   Plan  Continue with current plan of care      Progression Toward Goals   Progression toward goals  Progressing toward goals       SLP Education - 10/22/17 1624    Education provided  Yes    Education Details  WARM memory strategies (re-education)    Person(s) Educated  Patient;Spouse    Methods  Explanation    Comprehension  Verbalized understanding       SLP Short Term Goals - 10/22/17 1630      SLP SHORT TERM GOAL #1   Title  pt will demo selective attention for 10 minutes in min noisy environment in simple cognitive linguistic task over three sessions    Baseline  10-22-17    Status  Partially Met      SLP SHORT TERM GOAL #2   Title  pt will  tell SLP 3 non-physical deficits without cues    Status  Achieved      SLP SHORT TERM GOAL #3   Title  Pt will tell SLP 4 memory strategies with occasional min A    Status  Not Met      SLP SHORT TERM GOAL #4   Title  pt will perform safe swallow strategies with POs, given occasional min A over 3 sessions  Baseline  10-22-17    Status  Partially Met and moved to [place] #5   Title  pt will demo HEP for swallowing with occasional min A    Status  Partially Met       SLP Long Term Goals - 10/22/17 1632      SLP LONG TERM GOAL #1   Title  pt will alternate attention with simple-mod complex cognitive linguistic tasks with at least 90% overall with self correction allowed, over three sessions    Time  5    Period  Weeks    Status  On-going      SLP LONG TERM GOAL #2   Title  pt will demo emergent awareness by self correcting 100% of errors on simple-mod complec linguistic tasks with verbal cues, rarely    Time  5    Period  Weeks    Status  On-going      SLP LONG TERM GOAL #3   Title  pt will utilize a memory strategy in 5 ST sessions with nonverbal cues    Time  5    Period  Weeks    Status  On-going      SLP LONG TERM GOAL #4   Title  pt will perform swallow HEP with modified independence    Time  5    Period  Weeks    Status  On-going      SLP LONG TERM GOAL #5   Title  pt will perform swallow precautions over 2 sessions    Time  5    Period  Weeks    Status  New       Plan - 10/22/17 1626    Clinical Impression Statement  Pt seen today for observation of POs and for (what resulted as) re-education of memory strategies. See "skilled intervention" for more detail. Pt declined/refused water today and preferred drinking her Coke. Pt will cont to be seen for cognitive linguistics as well as dysphagia.     Speech Therapy Frequency  2x / week    Duration  -- 8 weeks    Treatment/Interventions  Aspiration precaution training;Pharyngeal  strengthening exercises;Diet toleration management by SLP;Environmental controls;Compensatory techniques;Trials of upgraded texture/liquids;Internal/external aids;Cognitive reorganization;Cueing hierarchy;SLP instruction and feedback;Functional tasks;Patient/family education    Potential to Achieve Goals  Good    Potential Considerations  Ability to learn/carryover information;Cooperation/participation level;Family/community support    Consulted and Agree with Plan of Care  Patient;Family member/caregiver    Family Member Consulted  daughter [name]       Patient will benefit from skilled therapeutic intervention in order to improve the following deficits and impairments:   Dysphagia, oropharyngeal phase  Cognitive communication deficit    Problem List Patient Active Problem List   Diagnosis Date Noted  . Adjustment disorder with mixed anxiety and depressed mood   . Hemoptysis   . Hematemesis   . Intractable vomiting with nausea   . Acute lower UTI   . Urinary retention   . Diabetic gastroparesis ([place])   . Vascular headache   . Diabetes mellitus ([place])   . CVA (cerebral vascular accident) ([place]) 06/18/2017  . Weakness   . 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 Right hemiparesis ([place])   . DNR (do not resuscitate) discussion 06/16/2017  . Palliative care by specialist 06/16/2017  . Anxiety state   . Acute embolic stroke ([place])   . Benign  essential HTN   . Diabetic peripheral neuropathy ([place])   . Pseudotumor cerebri   . Hypokalemia   . Seizure ([place])   . Leukocytosis   . Acute blood loss anemia   . Post-operative pain   . Encounter for feeding tube placement   . Respiratory failure ([place])   . Status post tracheostomy ([place])   . Acute respiratory failure with hypoxemia ([place])   . Stenosis of left carotid artery   . Acute CVA (cerebrovascular accident) ([place]) 05/31/2017  . Type II diabetes mellitus with complication ([place]) 74/45/1460  . Hypertension  05/31/2017  . Hyponatremia 05/31/2017  . IIH (idiopathic intracranial hypertension) 01/18/2016  . Malfunction of ventriculo-peritoneal shunt ([place]) 01/18/2016  . 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 Worsening headaches 01/18/2016  . Increased abdominal girth 01/18/2016  . Abdominal fluid collection 01/18/2016  . Vision changes 01/18/2016  . Perceived hearing changes 01/18/2016  . Nausea without vomiting 01/18/2016  . Peripheral vision loss 01/18/2016  . Imbalance 01/18/2016  . Falls 01/18/2016  . Dysphagia, oropharyngeal 01/18/2016  . Subacute confusional state 01/18/2016  . Left breast mass 05/05/2011    [facility] ,MS, CCC-SLP  10/22/2017, 4:34 PM  [facility] [address] [place], [place], [address] Phone: [phone]   Fax:  [phone]   Name: ANUM PALECEK MRN: [id number] Date of Birth: 19-Dec-1950

## 2017-11-02 ENCOUNTER — Encounter: Payer: Self-pay | Admitting: Neurology

## 2017-11-02 ENCOUNTER — Ambulatory Visit: Payer: Self-pay | Admitting: Neurology

## 2017-11-02 ENCOUNTER — Ambulatory Visit: Payer: Medicare Other | Admitting: Neurology

## 2017-11-02 VITALS — BP 113/72 | HR 56

## 2017-11-02 DIAGNOSIS — F4321 Adjustment disorder with depressed mood: Secondary | ICD-10-CM

## 2017-11-02 MED ORDER — SERTRALINE HCL 25 MG PO TABS: 25.0000 mg | ORAL_TABLET | Freq: Every day | ORAL | 11 refills | Status: DC

## 2017-11-02 NOTE — Patient Instructions (Addendum)
Start Zoloft 25mg  daily. Can slowly increase to 100mg  daily  Therapy and counseling Dr. Eino Farber or otherwise, can call insurance and see if there are therapists paid for on plan  Pt, OT: please make an appointment, let me know if you need another referral  Consider Botox for spasticity   Sertraline tablets What is this medicine? SERTRALINE (SER tra leen) is used to treat depression. It may also be used to treat obsessive compulsive disorder, panic disorder, post-trauma stress, premenstrual dysphoric disorder (PMDD) or social anxiety. This medicine may be used for other purposes; ask your health care provider or pharmacist if you have questions. COMMON BRAND NAME(S): Zoloft What should I tell my health care provider before I take this medicine? They need to know if you have any of these conditions: -bleeding disorders -bipolar disorder or a family history of bipolar disorder -glaucoma -heart disease -high blood pressure -history of irregular heartbeat -history of low levels of calcium, magnesium, or potassium in the blood -if you often drink alcohol -liver disease -receiving electroconvulsive therapy -seizures -suicidal thoughts, plans, or attempt; a previous suicide attempt by you or a family member -take medicines that treat or prevent blood clots -thyroid disease -an unusual or allergic reaction to sertraline, other medicines, foods, dyes, or preservatives -pregnant or trying to get pregnant -breast-feeding How should I use this medicine? Take this medicine by mouth with a glass of water. Follow the directions on the prescription label. You can take it with or without food. Take your medicine at regular intervals. Do not take your medicine more often than directed. Do not stop taking this medicine suddenly except upon the advice of your doctor. Stopping this medicine too quickly may cause serious side effects or your condition may worsen. A special MedGuide will be given to you  by the pharmacist with each prescription and refill. Be sure to read this information carefully each time. Talk to your pediatrician regarding the use of this medicine in children. While this drug may be prescribed for children as young as 7 years for selected conditions, precautions do apply. Overdosage: If you think you have taken too much of this medicine contact a poison control center or emergency room at once. NOTE: This medicine is only for you. Do not share this medicine with others. What if I miss a dose? If you miss a dose, take it as soon as you can. If it is almost time for your next dose, take only that dose. Do not take double or extra doses. What may interact with this medicine? Do not take this medicine with any of the following medications: -cisapride -dofetilide -dronedarone -linezolid -MAOIs like Carbex, Eldepryl, Marplan, Nardil, and Parnate -methylene blue (injected into a vein) -pimozide -thioridazine This medicine may also interact with the following medications: -alcohol -amphetamines -aspirin and aspirin-like medicines -certain medicines for depression, anxiety, or psychotic disturbances -certain medicines for fungal infections like ketoconazole, fluconazole, posaconazole, and itraconazole -certain medicines for irregular heart beat like flecainide, quinidine, propafenone -certain medicines for migraine headaches like almotriptan, eletriptan, frovatriptan, naratriptan, rizatriptan, sumatriptan, zolmitriptan -certain medicines for sleep -certain medicines for seizures like carbamazepine, valproic acid, phenytoin -certain medicines that treat or prevent blood clots like warfarin, enoxaparin, dalteparin -cimetidine -digoxin -diuretics -fentanyl -isoniazid -lithium -NSAIDs, medicines for pain and inflammation, like ibuprofen or naproxen -other medicines that prolong the QT interval (cause an abnormal heart rhythm) -rasagiline -safinamide -supplements like St.  John's wort, kava kava, valerian -tolbutamide -tramadol -tryptophan This list may not describe all possible  interactions. Give your health care provider a list of all the medicines, herbs, non-prescription drugs, or dietary supplements you use. Also tell them if you smoke, drink alcohol, or use illegal drugs. Some items may interact with your medicine. What should I watch for while using this medicine? Tell your doctor if your symptoms do not get better or if they get worse. Visit your doctor or health care professional for regular checks on your progress. Because it may take several weeks to see the full effects of this medicine, it is important to continue your treatment as prescribed by your doctor. Patients and their families should watch out for new or worsening thoughts of suicide or depression. Also watch out for sudden changes in feelings such as feeling anxious, agitated, panicky, irritable, hostile, aggressive, impulsive, severely restless, overly excited and hyperactive, or not being able to sleep. If this happens, especially at the beginning of treatment or after a change in dose, call your health care professional. Dennis Bast may get drowsy or dizzy. Do not drive, use machinery, or do anything that needs mental alertness until you know how this medicine affects you. Do not stand or sit up quickly, especially if you are an older patient. This reduces the risk of dizzy or fainting spells. Alcohol may interfere with the effect of this medicine. Avoid alcoholic drinks. Your mouth may get dry. Chewing sugarless gum or sucking hard candy, and drinking plenty of water may help. Contact your doctor if the problem does not go away or is severe. What side effects may I notice from receiving this medicine? Side effects that you should report to your doctor or health care professional as soon as possible: -allergic reactions like skin rash, itching or hives, swelling of the face, lips, or  tongue -anxious -black, tarry stools -changes in vision -confusion -elevated mood, decreased need for sleep, racing thoughts, impulsive behavior -eye pain -fast, irregular heartbeat -feeling faint or lightheaded, falls -feeling agitated, angry, or irritable -hallucination, loss of contact with reality -loss of balance or coordination -loss of memory -painful or prolonged erections -restlessness, pacing, inability to keep still -seizures -stiff muscles -suicidal thoughts or other mood changes -trouble sleeping -unusual bleeding or bruising -unusually weak or tired -vomiting Side effects that usually do not require medical attention (report to your doctor or health care professional if they continue or are bothersome): -change in appetite or weight -change in sex drive or performance -diarrhea -increased sweating -indigestion, nausea -tremors This list may not describe all possible side effects. Call your doctor for medical advice about side effects. You may report side effects to FDA at 1-800-FDA-1088. Where should I keep my medicine? Keep out of the reach of children. Store at room temperature between 15 and 30 degrees C (59 and 86 degrees F). Throw away any unused medicine after the expiration date. NOTE: This sheet is a summary. It may not cover all possible information. If you have questions about this medicine, talk to your doctor, pharmacist, or health care provider.  2018 Elsevier/Gold Standard (2016-10-09 14:17:49)

## 2017-11-02 NOTE — Progress Notes (Signed)
[facility]    Provider:  Dr Jaynee Eagles Referring Provider: [name], MD Primary Care Physician:  [name], MD  CC:  Stroke  Interval history 11/02/2017:  [name] is a 67 y.o. female here as a referral from Dr. Felipa Eth for stroke. She has a past medical history of chronic refractory headaches, pseudotumor cerebri status post lumboperotoneal shunt, peripheral neuropathy, hypertension, uncontrolled hypercholesterolemia(last ldl 211), uncontrolled diabetes(Hgba1c last 9.5). She has been to physical and occupational therapy. She did not feel that Physical therapy did anything for her. She had an OT session. She had a PT session outpatient. She broke her wrist in a fall, her wrist was set wrong and the tendon was damaged. She has difficulty with the wrist so can;t use the walker. She has not had much PT since her stroke. She has declined physically because she has not been mobile.  Recommend family therapy.  HPI:  [name] is a 67 y.o. female here as a referral from Dr. Felipa Eth for stroke. She has a past medical history of chronic refractory headaches, pseudotumor cerebri status post lumboperotoneal shunt, peripheral neuropathy, hypertension, uncontrolled hypercholesterolemia(last ldl 211), uncontrolled diabetes(Hgba1c last 9.5). Review of records shows that patient was sent to the emergency room mid-August for right hand grip strength weakness. MRI showed acute and subacute infarcts in the left hemisphere in the left MCA distribution raising possibility of embolic source. CTA of the head and neck showed a large amount of noncalcified plaque in the proximal left internal carotid artery causing 60-65% stenosis and this was diagnosed as the likely etiology of emboli. Vascular was consulted for possible left carotid endarterectomy. A cerebral angiogram was ordered. During the procedure, patient had a sudden decline in mental status, became unresponsive, had  roving eye movements, dilated and reactive pupils, bilateral upper extremity tonic posturing, diaphoresis taken immediately for stat CT of the head and CT head and neck. There were multiple thrombi in the bilateral M1 and proximal M2 segments bilaterally and emergent mechanical thrombectomy was performed. Patient status post complete revascularization of bilateral M1 segments with reperfusion bilaterally. EEG showed mild generalized cerebral dysfunction, intermittent arm twitching was not associated with any epileptiform activity. Repeat MRI showed numerous acute new supra-and infratentorial infarct spanning multiple vascular territories consistent with embolic phenomena  She is scheduled for OT/PT/Speech at home.   She was trached, and was sent to inpatient rehabilitation at the end of August. She had intensive physical therapy, speech therapy and occupational therapy. Topamax was added to help manage persistent headache and titrated upwards without side effects. Her blood pressure was managed. She did require PEG placement.   Patient is here with daughter, is understandably distressed and both are crying. Reviewed imaging with patient, future plan including dual antiplatelet therapy for 3 months and then continuing with Plavix alone. She needs to strictly manage her hyperlipidemia and she was asked to discuss treatment with her primary care physician. Patient is continuing physical therapy, occupational therapy and speech therapy. Tracheostomy and PEG have been removed. Reassured patient that she can she see improvements even upwards of 6 months to a year after stroke and encouraged her to continue working hard in therapy.   Review of Systems: Patient complains of symptoms per HPI as well as the following symptoms: depression, anxiety, weakness, fatigue, nausea, headache. Pertinent negatives and positives per HPI. All others negative.      Social History   Socioeconomic History  . Marital  status: Married    Spouse name:  Simona Huh  . Number of children: 2  . Years of education: 28  . Highest education level: Not on file  Social Needs  . Financial resource strain: Not on file  . Food insecurity - worry: Not on file  . Food insecurity - inability: Not on file  . Transportation needs - medical: Not on file  . Transportation needs - non-medical: Not on file  Occupational History  . Not on file  Tobacco Use  . Smoking status: Never Smoker  . Smokeless tobacco: Never Used  Substance and Sexual Activity  . Alcohol use: No  . Drug use: No  . Sexual activity: Not on file  Other Topics Concern  . Not on file  Social History Narrative   Lives at home with husband and daughter   Caffeine use: soda daily    Family History  Problem Relation Age of Onset  . Heart disease Father   . Migraines Neg Hx     Past Medical History:  Diagnosis Date  . Anemia    takes iron supplement  . Anesthesia complication    disorientation due to pseudotumor  . Diabetes mellitus    IDDM  . Family history of anesthesia complication 64 yrs ago   brother stopped breathing for a minute or two  . GERD (gastroesophageal reflux disease)   . Headache(784.0)    due to pseudotumor; daily headache  . Hypercholesteremia    unable to tolerate statins  . Hypertension    under control with meds., has been on med. x 5 yr.  . Peripheral neuropathy   . PONV (postoperative nausea and vomiting)   . 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 Synovitis of ankle 04/2013   left  . Wears dentures    full    Past Surgical History:  Procedure Laterality Date  . ANKLE ARTHROSCOPY Left 05/18/2013   Procedure: LEFT ANKLE ARTHROSCOPY WITH DEBRIDEMENT,  SUBTALAR OPEN DEBRIDEMENT ;  Surgeon: Wylene Simmer, MD;  Location: [place];  Service: Orthopedics;  Laterality: Left;  . APPENDECTOMY     as a child  . BALLOON DILATION  N/A 07/04/2013   Procedure: BALLOON DILATION;  Surgeon: Garlan Fair, MD;  Location: [facility];  Service: Endoscopy;  Laterality: N/A;  . BLADDER SUSPENSION    . BREAST LUMPECTOMY W/ NEEDLE LOCALIZATION Left 05/22/2011  . CAROTID ANGIOGRAPHY Bilateral 06/03/2017   Procedure: Bilateral Carotid Angiography;  Surgeon: Conrad Aguila, MD;  Location: [facility];  Service: Cardiovascular;  Laterality: Bilateral;  . COLONOSCOPY WITH PROPOFOL N/A 05/14/2014   Procedure: COLONOSCOPY WITH PROPOFOL;  Surgeon: Garlan Fair, MD;  Location: [facility];  Service: Endoscopy;  Laterality: N/A;  . ESOPHAGEAL DILATION  06/23/2006; 08/05/2004  . ESOPHAGOGASTRODUODENOSCOPY N/A 07/04/2013   Procedure: ESOPHAGOGASTRODUODENOSCOPY (EGD);  Surgeon: Garlan Fair, MD;  Location: [facility];  Service: Endoscopy;  Laterality: N/A;  . ESOPHAGOGASTRODUODENOSCOPY (EGD) WITH PROPOFOL N/A 05/14/2014   Procedure: ESOPHAGOGASTRODUODENOSCOPY (EGD) WITH PROPOFOL;  Surgeon: Garlan Fair, MD;  Location: [facility];  Service: Endoscopy;  Laterality: N/A;  . IR ANGIO VERTEBRAL SEL SUBCLAVIAN INNOMINATE UNI R MOD SED  06/03/2017  . IR GASTROSTOMY TUBE MOD SED  06/25/2017  . IR GASTROSTOMY TUBE REMOVAL  08/20/2017  . IR PERCUTANEOUS ART THROMBECTOMY/INFUSION INTRACRANIAL INC DIAG ANGIO  06/03/2017  . IR PERCUTANEOUS ART THROMBECTOMY/INFUSION INTRACRANIAL INC DIAG ANGIO  06/03/2017  .  IR RADIOLOGIST EVAL & MGMT  09/07/2017  . LUMBAR PERITONEAL SHUNT     x 2  . OVARIAN CYST REMOVAL     age 39  . RADIOLOGY WITH ANESTHESIA N/A 06/03/2017   Procedure: RADIOLOGY WITH ANESTHESIA;  Surgeon: Luanne Bras, MD;  Location: [facility];  Service: Radiology;  Laterality: N/A;  . SHUNT REVISION  2007  . TOTAL ABDOMINAL HYSTERECTOMY W/ BILATERAL SALPINGOOPHORECTOMY  1994   age 71s  . WRIST SURGERY Left 2014    [name]    Current Outpatient Medications  Medication Sig Dispense Refill  . acetaZOLAMIDE (DIAMOX) 250 MG tablet  Take 1 tablet (250 mg total) by mouth 3 (three) times daily. 90 tablet 12  . aspirin 325 MG tablet Take 1 tablet (325 mg total) by mouth daily.    . cloNIDine (CATAPRES - DOSED IN MG/24 HR) 0.2 mg/24hr patch Place 1 patch (0.2 mg total) onto the skin once a week. On wednesdays (Patient taking differently: 0.2 mg 2 (two) times daily. On wednesdays) 4 patch 0  . clopidogrel (PLAVIX) 75 MG tablet Take 1 tablet (75 mg total) by mouth daily with breakfast. 30 tablet 0  . diclofenac sodium (VOLTAREN) 1 % GEL Apply 2 application topically 4 (four) times daily as needed (pain).     Marland Kitchen docusate sodium (COLACE) 100 MG capsule Take 1 capsule (100 mg total) by mouth daily. (Patient taking differently: Take 100 mg by mouth as needed (liquid). ) 30 capsule 0  . esomeprazole (NEXIUM) 40 MG capsule Take 40 mg by mouth daily as needed (acid reflux). Acid reflux    . estropipate (OGEN) 1.5 MG tablet Take 0.75 mg by mouth daily.     . insulin glargine (LANTUS) 100 unit/mL SOPN Inject 0.2 mLs (20 Units total) into the skin at bedtime. 15 mL 11  . metoprolol tartrate (LOPRESSOR) 100 MG tablet Take 1 tablet (100 mg total) by mouth 2 (two) times daily. 60 tablet 1  . PRESCRIPTION MEDICATION Take 20 mg by mouth See admin instructions. Domperidone 10 mg from [place]: Take 2 tablets (20 mg) by mouth three times daily with meals - for diabetic gastroparesis    . prochlorperazine (COMPAZINE) 5 MG tablet Take 1 tablet (5 mg total) by mouth every 8 (eight) hours as needed for nausea or vomiting. Have been taking it with meals--wean as able 90 tablet 0  . topiramate (TOPAMAX) 50 MG tablet Take 1 tablet (50 mg total) by mouth 2 (two) times daily. 60 tablet 0  . Water For Irrigation, Sterile (FREE WATER) SOLN Take 200 mLs by mouth every 8 (eight) hours. Use filtered water    . sertraline (ZOLOFT) 25 MG tablet Take 1 tablet (25 mg total) by mouth daily. 30 tablet 11   No current facility-administered medications for this visit.      Allergies as of 11/02/2017 - Review Complete 11/02/2017  Allergen Reaction Noted  . Carbamazepine Hives, Shortness Of Breath, and Other (See Comments) 12/29/2010  . Penicillins Anaphylaxis and Other (See Comments) 05/05/2011  . Statins Other (See Comments) 05/05/2011  . Tricyclic antidepressants Other (See Comments) 05/05/2011  . Atorvastatin Other (See Comments) 12/29/2010  . Ezetimibe Other (See Comments) 12/29/2010  . Nortriptyline Other (See Comments) 06/28/2014  . Quinapril hcl Other (See Comments) 12/29/2010  . Rifampin Diarrhea 01/22/2012  . Metoclopramide Nausea And Vomiting and Rash 12/29/2010    Vitals: BP 113/72 (BP Location: Left Arm, Patient Position: Sitting)   Pulse (!) 56  Last Weight:  Wt Readings  from Last 1 Encounters:  07/13/17 172 lb (78 kg)   Last Height:   Ht Readings from Last 1 Encounters:  07/13/17 5\' 4"  (1.626 m)       Gen: crying, distressed                  CV: RRR, no MRG. No Carotid Bruits. No peripheral edema, warm, nontender Eyes: Conjunctivae clear without exudates or hemorrhage  Neuro: Detailed Neurologic Exam  Speech:    Mild dysarthria Cognition:    The patient is oriented to person, place, and time;     recent and remote memory intact;   Cranial Nerves:    The pupils are equal, round, and reactive to light. right homonymous hemianopia . Extraocular movements are intact. Trigeminal sensation is intact and the muscles of mastication are normal. Mild left facial droopThe palate elevates in the midline. Hearing intact. Voice is normal. Shoulder shrug is normal. The tongue has normal motion without fasciculations.   Motor Observation:    no involuntary movements noted.    Strength:  Right arm 3/5, right prox extremity 3/5, left prox extremity 3+/5.dstally in the lower extremities the strength is 4+-5/5.      Patient has right hemiparesis 3+ to 4/5 throughout on the right, left is essential normal  Increased tone right arm  > right leg        Assessment/Plan:   67 y.o. female here as a referral from Dr. Felipa Eth for stroke. She has a past medical history of chronic refractory headaches, pseudotumor cerebri status post lumboperotoneal shunt, peripheral neuropathy, hypertension, hypercholesterolemia, uncontrolled diabetes. Patient was admitted to [facility] in mid August due to new onset right hand weakness and acute embolic strokes seen on MRI. Cerebral angiogram was performed to evaluate left internal carotid artery stenosis and possible carotid endarterectomy, during cerebral angiogram patient had acute mental status decline found to have bilateral M1 thrombus status post mechanical thrombectomy. Follow-up MRI showed numerous acute new supratentorial and infratentorial infarcts bilaterally spanning multiple vascular territories embolic phenomena. Follow-up vascular studies showed left ICA likely less than 50% stenosis and no intervention needed at this time but recommended dual antiplatelet therapy.   Start Zoloft 25mg  daily. Can slowly increase to 100mg  daily for adjustment disorder with depression  Therapy and counseling Dr. Eino Farber or otherwise, can call insurance and see if there are therapists paid for on plan  Pt, OT: please make an appointment, let me know if you need another referral  Consider Botox for spasticity in the future if needed   I had a long d/w patient about her recent stroke, risk for recurrent stroke/TIAs, personally independently reviewed imaging studies and stroke evaluation results and answered questions.Continue Plavix and ASA for secondary stroke prevention for 3 months and then Plavix alone, and maintain strict control of hypertension with blood pressure goal below 130/90, diabetes with hemoglobin A1c goal below 7% and lipids with LDL cholesterol goal below 70 mg/dL.Patient has h/o statin intolerance hence recommend new PCSK9 inhibitor like Praluent. Recommend she see her primary MD  to get prescription. I also advised the patient to eat a healthy diet with plenty of whole grains, cereals, fruits and vegetables, exercise regularly and maintain ideal body weight.  Continue physical therapy, occupational therapy and speech therapy.  Continue with Dr.Kierstein is in physical rehabilitation   .Followup with me in 4-6 months or call earlier if necessary.  Sarina Ill, MD  [facility] [address] [place], [place] [address]  Phone [phone] Fax [phone]  A total of 35 minutes was spent face-to-face with this patient. Over half this time was spent on counseling patient on the adjustment disorder, stroke  diagnosis and different diagnostic and therapeutic options available.

## 2017-11-15 ENCOUNTER — Telehealth: Payer: Self-pay | Admitting: Neurology

## 2017-11-15 ENCOUNTER — Ambulatory Visit: Payer: Medicare Other | Admitting: Speech Pathology

## 2017-11-15 DIAGNOSIS — R41841 Cognitive communication deficit: Secondary | ICD-10-CM | POA: Diagnosis not present

## 2017-11-15 DIAGNOSIS — I63413 Cerebral infarction due to embolism of bilateral middle cerebral arteries: Secondary | ICD-10-CM

## 2017-11-15 DIAGNOSIS — R1312 Dysphagia, oropharyngeal phase: Secondary | ICD-10-CM

## 2017-11-15 NOTE — Patient Instructions (Addendum)
  Try having extra sauce (ketchup, gravy, etc) with your chicken if you are having difficulty chewing it.  Bring a copy of your swallowing exercises next time you come to therapy.   Memory Strategies  W - Write it down  A - Associate it with something  R - Repeat it  M - Mental Image     Play the memory game  Try to remember 3-5 items on your store list without looking  Study a detailed picture in a magazine for 1 minute, then write down everything you can remember from the picture

## 2017-11-15 NOTE — Telephone Encounter (Signed)
Patient needs new orders for occupational and physical therapy. Best call back [phone]

## 2017-11-15 NOTE — Telephone Encounter (Signed)
Placed orders per Dr. Jaynee Eagles for PT & OT referral. Called and spoke with pt's daughter who verbalized understanding.   While on phone with [name] (had spoken with her this AM), she stated she [name]) had not been to the hospital. I advised she can call 911, should take care of herself. She verbalized understanding.

## 2017-11-15 NOTE — Therapy (Addendum)
[facility] [address] [place], [place], [address] Phone: [phone]   Fax:  [phone]  Speech Language Pathology Treatment  Patient Details  Name: [name] MRN: [id number] Date of Birth: August 25, 1951 Referring Provider: Marisa Sprinkles   Encounter Date: 11/15/2017  End of Session - 11/15/17 2132    Visit Number  6    Number of Visits  17    Date for SLP Re-Evaluation  12/10/17    SLP Start Time  1154 11:53 check in    SLP Stop Time   40    SLP Time Calculation (min)  36 min    Activity Tolerance  Patient tolerated treatment well       Past Medical History:  Diagnosis Date  . Anemia    takes iron supplement  . Anesthesia complication    disorientation due to pseudotumor  . Diabetes mellitus    IDDM  . Family history of anesthesia complication 60 yrs ago   brother stopped breathing for a minute or two  . GERD (gastroesophageal reflux disease)   . Headache(784.0)    due to pseudotumor; daily headache  . Hypercholesteremia    unable to tolerate statins  . Hypertension    under control with meds., has been on med. x 5 yr.  . Peripheral neuropathy   . PONV (postoperative nausea and vomiting)   . 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 Synovitis of ankle 04/2013   left  . Wears dentures    full    Past Surgical History:  Procedure Laterality Date  . ANKLE ARTHROSCOPY Left 05/18/2013   Procedure: LEFT ANKLE ARTHROSCOPY WITH DEBRIDEMENT,  SUBTALAR OPEN DEBRIDEMENT ;  Surgeon: Wylene Simmer, MD;  Location: [facility];  Service: Orthopedics;  Laterality: Left;  . APPENDECTOMY     as a child  . BALLOON DILATION N/A 07/04/2013   Procedure: BALLOON DILATION;  Surgeon: Garlan Fair, MD;  Location: [facility];  Service: Endoscopy;  Laterality: N/A;  . BLADDER SUSPENSION    . BREAST LUMPECTOMY W/ NEEDLE  LOCALIZATION Left 05/22/2011  . CAROTID ANGIOGRAPHY Bilateral 06/03/2017   Procedure: Bilateral Carotid Angiography;  Surgeon: Conrad [place], MD;  Location: [facility];  Service: Cardiovascular;  Laterality: Bilateral;  . COLONOSCOPY WITH PROPOFOL N/A 05/14/2014   Procedure: COLONOSCOPY WITH PROPOFOL;  Surgeon: Garlan Fair, MD;  Location: [facility];  Service: Endoscopy;  Laterality: N/A;  . ESOPHAGEAL DILATION  06/23/2006; 08/05/2004  . ESOPHAGOGASTRODUODENOSCOPY N/A 07/04/2013   Procedure: ESOPHAGOGASTRODUODENOSCOPY (EGD);  Surgeon: Garlan Fair, MD;  Location: [facility];  Service: Endoscopy;  Laterality: N/A;  . ESOPHAGOGASTRODUODENOSCOPY (EGD) WITH PROPOFOL N/A 05/14/2014   Procedure: ESOPHAGOGASTRODUODENOSCOPY (EGD) WITH PROPOFOL;  Surgeon: Garlan Fair, MD;  Location: [facility];  Service: Endoscopy;  Laterality: N/A;  . IR ANGIO VERTEBRAL SEL SUBCLAVIAN INNOMINATE UNI R MOD SED  06/03/2017  . IR GASTROSTOMY TUBE MOD SED  06/25/2017  . IR GASTROSTOMY TUBE REMOVAL  08/20/2017  . IR PERCUTANEOUS ART THROMBECTOMY/INFUSION INTRACRANIAL INC DIAG ANGIO  06/03/2017  . IR PERCUTANEOUS ART THROMBECTOMY/INFUSION INTRACRANIAL INC DIAG ANGIO  06/03/2017  . IR RADIOLOGIST EVAL & MGMT  09/07/2017  . LUMBAR PERITONEAL SHUNT     x 2  . OVARIAN CYST REMOVAL     age 35  . RADIOLOGY WITH ANESTHESIA N/A 06/03/2017   Procedure: RADIOLOGY WITH  ANESTHESIA;  Surgeon: Luanne Bras, MD;  Location: [facility];  Service: Radiology;  Laterality: N/A;  . SHUNT REVISION  2007  . TOTAL ABDOMINAL HYSTERECTOMY W/ BILATERAL SALPINGOOPHORECTOMY  1994   age 16s  . WRIST SURGERY Left 2014    [name]    There were no vitals filed for this visit.  Subjective Assessment - 11/15/17 1155    Subjective  "I don't want another swallow test."    Patient is accompained by:  Family member husband    Currently in Pain?  Yes    Pain Score  6     Pain Location  Wrist    Pain Orientation  Right    Pain Type   Acute pain    Pain Onset  More than a month ago    Pain Frequency  Constant    Aggravating Factors   bumping it    Pain Relieving Factors  meds    Effect of Pain on Daily Activities  limits mobility of r hand/thumb    Multiple Pain Sites  No            ADULT SLP TREATMENT - 11/15/17 1156      General Information   Behavior/Cognition  Alert;Cooperative    Patient Positioning  Upright in chair    Oral care provided  N/A      Treatment Provided   Treatment provided  Dysphagia      Dysphagia Treatment   Temperature Spikes Noted  No    Respiratory Status  Room air    Oral Cavity - Dentition  Edentulous    Treatment Methods  Skilled observation;Compensation strategy training;Patient/caregiver education    Patient observed directly with PO's  Yes    Type of PO's observed  Regular;Thin liquids    Feeding  Able to feed self    Liquids provided via  Cup    Oral Phase Signs & Symptoms  Prolonged mastication    Pharyngeal Phase Signs & Symptoms  -- none    Amount of cueing  Independent    Other treatment/comments  Pt returns for first treatment session in over 3 weeks, arriving 8 minutes late. SLP reviewed plan of care with pt and suggested renewal of 2x per week for 4 weeks to continue targeting current goals as pt has missed several sessions due to illness, sprained wrist. Informed pt that our office did not receive her swallowing exercise program from [facility]. Pt described, demo'd exercises she was able to recall from her [facility] routine, including lingual press, ROM exercises and [place] swallow with good technique. Reviewed options discussed during prior sessions including repeat MBS however pt adamantly declines repeat swallow examination. SLP provided upgraded texture trials of regular solid crackers; pt also took sips of her preferred soda vs water. As pt has had weight loss and dentures no longer fit, her mastication is prolonged, but functional. Pt independently utilizing slow rate, lingual  sweep and washes of thin liquid for clearance. Reviewed pt's most recent MBS from 06/2017, which revealed mild-moderate oral deficits and mild pharyngeal impairments; instance of frank penetration, which was sensed and cleared, but no aspiration. She and her husband report she is consuming soft and regular solids without difficulty, including hot dogs, hamburgers, crackers. They deny coughing, choking, or regurgitation. Pt does continue to report difficulties with dry, fibrous meats including chicken. SLP recommended consuming such consistencies with extra sauce/gravy to moisten and aid bolus cohesion, clearance.      Cognitive-Linquistic Treatment   Treatment focused  on  Patient/family/caregiver education    Skilled Treatment  Pt recalled acronym WARM but could not recall strategies for memory with mod cues.      Assessment / Recommendations / Plan   Plan  Goals updated      Dysphagia Recommendations   Diet recommendations  Regular;Thin liquid;Dysphagia 3 (mechanical soft) as tolerated; use extra sauce/gravy/moisten dry foods    Liquids provided via  Cup;Straw    Medication Administration  Whole meds with liquid    Supervision  Patient able to self feed    Compensations  Slow rate;Small sips/bites    Postural Changes and/or Swallow Maneuvers  Seated upright 90 degrees      Progression Toward Goals   Progression toward goals  Progressing toward goals       SLP Education - 11/15/17 2131    Education provided  Yes    Education Details  WARM memory strategies (reprinted and given to pt), moisten dry foods with extra sauce    Person(s) Educated  Patient;Spouse    Methods  Explanation;Handout    Comprehension  Verbalized understanding       SLP Short Term Goals - 11/15/17 2135      SLP SHORT TERM GOAL #1   Title  pt will demo selective attention for 10 minutes in min noisy environment in simple cognitive linguistic task over three sessions    Status  Partially Met      SLP [place] #2   Title  pt will tell SLP 3 non-physical deficits without cues    Status  Achieved      SLP SHORT TERM GOAL #3   Title  Pt will tell SLP 4 memory strategies with occasional min A    Status  Not Met      SLP SHORT TERM GOAL #4   Title  pt will perform safe swallow strategies with POs, given occasional min A over 3 sessions    Status  Partially Met      SLP SHORT TERM GOAL #5   Title  pt will demo HEP for swallowing with occasional min A    Status  Partially Met       SLP Long Term Goals - 11/15/17 2136      SLP LONG TERM GOAL #1   Title  pt will alternate attention with simple-mod complex cognitive linguistic tasks with at least 90% overall with self correction allowed, over three sessions    Baseline  renewed for 4 weeks 11/15/08    Time  4    Period  Weeks    Status  On-going      SLP LONG TERM GOAL #2   Title  pt will demo emergent awareness by self correcting 100% of errors on simple-mod complec linguistic tasks with verbal cues, rarely    Baseline  renewed for 4 weeks 11/15/08    Time  4    Period  Weeks    Status  On-going      SLP LONG TERM GOAL #3   Title  pt will utilize a memory strategy in 5 ST sessions with nonverbal cues    Baseline  renewed for 4 weeks 11/15/08    Time  4    Period  Weeks      SLP LONG TERM GOAL #4   Title  pt will perform swallow HEP with modified independence    Time  4    Period  Weeks    Status  Deferred  SLP LONG TERM GOAL #5   Title  pt will perform swallow precautions over 2 sessions    Time  4    Period  Weeks    Status  Achieved       Plan - 11/15/17 2140    Clinical Impression Statement  Pt presents today with mild oral deficits impacted primarily by lack of dentition, and no overt signs of aspiration with upgraded trials of regular solids, thin liquids. Per pt and husband's report, she has been tolerating regular textures at home. While mastication is prolonged, pt appears to be compensating well, and I have  advised she should continue regular solids, thin liquids, adding extra sauce/gravy and chopping tougher foods as needed. Cognitive-communication impairments persist; memory deficits noted today. Renewal completed today for 2x per week for 4 weeks continuing to target current cognitive goals only. Pt's motivation and willingness to participate in treatment tasks have limited progress. If improvements in these areas not seen within next 2-3 sessions, will consider decrease in frequency to 1x per week/ d/c.   Speech Therapy Frequency  2x / week    Duration  4 weeks    Treatment/Interventions  Environmental controls;Compensatory techniques;Internal/external aids;Cognitive reorganization;Cueing hierarchy;SLP instruction and feedback;Functional tasks;Patient/family education    Potential to Achieve Goals  Fair    Potential Considerations  Ability to learn/carryover information;Cooperation/participation level;Family/community support    Consulted and Agree with Plan of Care  Patient;Family member/caregiver    Family Member Consulted  husband       Patient will benefit from skilled therapeutic intervention in order to improve the following deficits and impairments:   Cognitive communication deficit  Dysphagia, oropharyngeal phase    Problem List Patient Active Problem List   Diagnosis Date Noted  . Adjustment disorder with depressed mood 11/02/2017  . Adjustment disorder with mixed anxiety and depressed mood   . Hemoptysis   . Hematemesis   . Intractable vomiting with nausea   . Acute lower UTI   . Urinary retention   . Diabetic gastroparesis ([place])   . Vascular headache   . Diabetes mellitus ([place])   . CVA (cerebral vascular accident) ([place]) 06/18/2017  . Weakness   . 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 Right hemiparesis ([place])   . DNR (do not resuscitate) discussion 06/16/2017  . Palliative care by specialist 06/16/2017  . Anxiety state   . Acute embolic  stroke ([place])   . Benign essential HTN   . Diabetic peripheral neuropathy ([place])   . Pseudotumor cerebri   . Hypokalemia   . Seizure ([place])   . Leukocytosis   . Acute blood loss anemia   . Post-operative pain   . Encounter for feeding tube placement   . Respiratory failure ([place])   . Status post tracheostomy ([place])   . Acute respiratory failure with hypoxemia ([place])   . Stenosis of left carotid artery   . Acute CVA (cerebrovascular accident) ([place]) 05/31/2017  . Type II diabetes mellitus with complication ([place]) 16/38/4536  . Hypertension 05/31/2017  . Hyponatremia 05/31/2017  . IIH (idiopathic intracranial hypertension) 01/18/2016  . Malfunction of ventriculo-peritoneal shunt ([place]) 01/18/2016  . 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 Worsening headaches 01/18/2016  . Increased abdominal girth 01/18/2016  . Abdominal fluid collection 01/18/2016  . Vision changes 01/18/2016  . Perceived hearing changes 01/18/2016  . Nausea without vomiting 01/18/2016  . Peripheral vision loss 01/18/2016  . Imbalance 01/18/2016  . Falls 01/18/2016  . Dysphagia, oropharyngeal 01/18/2016  .  Subacute confusional state 01/18/2016  . Left breast mass 05/05/2011   Deneise Lever, [place], [place] 11/15/2017, 9:42 PM  [facility] [address] [place], [place], [address] Phone: [phone]   Fax:  [phone]   Name: [name] MRN: [id number] Date of Birth: 1951/08/03

## 2017-11-18 ENCOUNTER — Encounter: Payer: Self-pay | Admitting: Occupational Therapy

## 2017-11-18 ENCOUNTER — Ambulatory Visit: Payer: Medicare Other

## 2017-11-18 ENCOUNTER — Other Ambulatory Visit (HOSPITAL_COMMUNITY): Payer: Self-pay | Admitting: Interventional Radiology

## 2017-11-18 DIAGNOSIS — I771 Stricture of artery: Secondary | ICD-10-CM

## 2017-11-18 DIAGNOSIS — I639 Cerebral infarction, unspecified: Secondary | ICD-10-CM

## 2017-11-18 NOTE — Therapy (Signed)
[facility] [address] [place], [place], [address] Phone: [phone]   Fax:  [phone]  Patient Details  Name: [name] MRN: [id number] Date of Birth: September 02, 1951 Referring Provider:  No ref. provider found  Encounter Date: 11/18/2017   OCCUPATIONAL THERAPY DISCHARGE SUMMARY  Visits from Start of Care: 3  Current functional level related to goals / functional outcomes: Goals not met due to pt not returning to OT due to wrist injury.   Remaining deficits: See eval as pt only seen for eval +2 visits.   Education / Equipment: Education not completed due to pt not returning to OT.    Plan: Patient agrees to discharge.  Patient goals were not met. Patient is being discharged due to not returning since the last visit.  Pt's last OT visit was 08/27/17????  Received message that pt sustained wrist injury and was not able to participate in OT/PT for 3 weeks.  However, pt has not returned to OT and will be discharged at this time.       [facility] 11/18/2017, 1:01 PM  [facility] [address] [place], [place], [address] Phone: [phone]   Fax:  [place], OTR/L [facility] [address] [place], [place]  [address] [phone] phone [phone] 11/18/17 1:01 PM

## 2017-11-24 ENCOUNTER — Ambulatory Visit: Payer: Medicare Other

## 2017-11-26 ENCOUNTER — Ambulatory Visit: Payer: Medicare Other

## 2017-12-06 ENCOUNTER — Telehealth: Payer: Self-pay | Admitting: Neurology

## 2017-12-06 NOTE — Telephone Encounter (Signed)
Called and spoke to Patient's husband and relayed that [facility] . Telephone [phone] . Patient's husband relayed they would call and schedule apt.

## 2017-12-13 ENCOUNTER — Ambulatory Visit (HOSPITAL_COMMUNITY): Admission: RE | Admit: 2017-12-13 | Payer: Medicare Other | Source: Ambulatory Visit

## 2017-12-17 ENCOUNTER — Ambulatory Visit (HOSPITAL_COMMUNITY)
Admission: RE | Admit: 2017-12-17 | Discharge: 2017-12-17 | Disposition: A | Discharge status: Home / Self Care | Payer: Medicare Other | Source: Ambulatory Visit | Attending: Interventional Radiology | Admitting: Interventional Radiology

## 2017-12-17 DIAGNOSIS — I639 Cerebral infarction, unspecified: Secondary | ICD-10-CM | POA: Diagnosis not present

## 2017-12-17 DIAGNOSIS — I6523 Occlusion and stenosis of bilateral carotid arteries: Secondary | ICD-10-CM | POA: Insufficient documentation

## 2017-12-17 DIAGNOSIS — I771 Stricture of artery: Secondary | ICD-10-CM | POA: Diagnosis present

## 2017-12-17 DIAGNOSIS — Z8673 Personal history of transient ischemic attack (TIA), and cerebral infarction without residual deficits: Secondary | ICD-10-CM | POA: Diagnosis not present

## 2017-12-17 NOTE — Progress Notes (Signed)
Bilateral carotid duplex completed. Right - 1% to 39% ICA stenosis. Vertebral artery flow is antegrade. Left - 40% to 59% ICA stenosis. Vertebral artery flow is antegrade. There is no signiciant changes since study of 06/01/2017. [name],RVS 12/17/2017, 11:55 AM

## 2018-01-06 ENCOUNTER — Telehealth (HOSPITAL_COMMUNITY): Payer: Self-pay

## 2018-01-06 NOTE — Telephone Encounter (Signed)
Called pt regarding recent US carotid, no answer, left vm for pt to return call. AW

## 2018-01-17 ENCOUNTER — Other Ambulatory Visit: Payer: Self-pay | Admitting: Neurology

## 2018-01-17 ENCOUNTER — Telehealth: Payer: Self-pay | Admitting: Neurology

## 2018-01-17 MED ORDER — TRAMADOL HCL 50 MG PO TABS: 50.0000 mg | ORAL_TABLET | Freq: Four times a day (QID) | ORAL | 0 refills | Status: DC | PRN

## 2018-01-17 NOTE — Telephone Encounter (Signed)
Pts daughter called stating that on 3/26 the pt fell causing her to sprain both ankles. Due to the pt being on Plavix the ortho Dr. will not prescribe pt anything stronger than tylenol. Pt is in an intense amount of pain and would like something to help.

## 2018-01-17 NOTE — Telephone Encounter (Signed)
Dr. Jaynee Eagles has authorized a short term supply of Tramadol until patient can see Dr. Felipa Eth.   Called and spoke with pt's daughter [name]. Discussed that patient sprained both ankles severely while attempting to move from the wheelchair into her son's house. She was seen at the ortho urgent care. RN discussed they likely would not prescribe stronger meds due to risk of fall. RN informed [name] that Dr. Jaynee Eagles will give a temporary supply of the Tramadol with their understanding that fall risk can be increased with this type of medication and to be very careful.  She is aware that this is only until pt can be seen with [facility] as Dr. Jaynee Eagles does not treat sprained ankles or pain management. She verbalized understanding of all and was very appreciative.

## 2018-01-17 NOTE — Telephone Encounter (Signed)
Faxed signed Tramadol prescription to pt's pharmacy. Received a receipt of confirmation.

## 2018-01-26 ENCOUNTER — Encounter: Payer: Self-pay | Admitting: Rehabilitative and Restorative Service Providers"

## 2018-01-26 NOTE — Therapy (Signed)
[facility] [address] [place], [place], [address] Phone: [phone]   Fax:  [phone]  Patient Details  Name: [name] MRN: [id number] Date of Birth: 05-Sep-1951 Referring Provider:  Alysia Penna  Encounter Date: last encounter 08/25/17  PHYSICAL THERAPY DISCHARGE SUMMARY  Visits from Start of Care: eval only on 11/7  Current functional level related to goals / functional outcomes: See note from 11/7 for patient status.   Remaining deficits: See note from 08/25/17 for patient status.   Education / Equipment: See note from 09/04/17  Plan: Patient agrees to discharge.  Patient goals were not met. Patient is being discharged due to a change in medical status.  ?????      Thank you for the referral of this patient. Rudell Cobb, MPT   [name] 01/26/2018, 10:14 AM  [facility] [address] [place], [place], [address] Phone: [phone]   Fax:  [phone]

## 2018-03-08 ENCOUNTER — Ambulatory Visit: Payer: Medicare Other | Admitting: Neurology

## 2018-04-19 ENCOUNTER — Ambulatory Visit: Payer: Medicare Other | Admitting: Sports Medicine

## 2018-04-19 ENCOUNTER — Encounter: Payer: Self-pay | Admitting: Sports Medicine

## 2018-04-19 DIAGNOSIS — Z8673 Personal history of transient ischemic attack (TIA), and cerebral infarction without residual deficits: Secondary | ICD-10-CM

## 2018-04-19 DIAGNOSIS — B351 Tinea unguium: Secondary | ICD-10-CM | POA: Diagnosis not present

## 2018-04-19 DIAGNOSIS — M79674 Pain in right toe(s): Secondary | ICD-10-CM

## 2018-04-19 DIAGNOSIS — M79675 Pain in left toe(s): Secondary | ICD-10-CM

## 2018-04-19 DIAGNOSIS — L84 Corns and callosities: Secondary | ICD-10-CM

## 2018-04-19 DIAGNOSIS — E0800 Diabetes mellitus due to underlying condition with hyperosmolarity without nonketotic hyperglycemic-hyperosmolar coma (NKHHC): Secondary | ICD-10-CM | POA: Diagnosis not present

## 2018-04-19 DIAGNOSIS — D689 Coagulation defect, unspecified: Secondary | ICD-10-CM

## 2018-04-19 DIAGNOSIS — R531 Weakness: Secondary | ICD-10-CM

## 2018-04-19 NOTE — Progress Notes (Signed)
Subjective: [name] is a 67 y.o. female patient seen today in office with complaint of mildly painful thickened and elongated toenails; unable to trim and pain at right great toe. Patient reports a history of stroke and since her toes have bothered her. Patient is assisted by husband who reports this history. Patient has no other pedal complaints at this time.   Patient Active Problem List   Diagnosis Date Noted  . Adjustment disorder with depressed mood 11/02/2017  . Adjustment disorder with mixed anxiety and depressed mood   . Hemoptysis   . Hematemesis   . Intractable vomiting with nausea   . Acute lower UTI   . Urinary retention   . Diabetic gastroparesis ([place])   . Vascular headache   . Diabetes mellitus ([place])   . CVA (cerebral vascular accident) ([place]) 06/18/2017  . Weakness   . 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 Right hemiparesis ([place])   . DNR (do not resuscitate) discussion 06/16/2017  . Palliative care by specialist 06/16/2017  . Anxiety state   . Acute embolic stroke ([place])   . Benign essential HTN   . Diabetic peripheral neuropathy ([place])   . Pseudotumor cerebri   . Hypokalemia   . Seizure ([place])   . Leukocytosis   . Acute blood loss anemia   . Post-operative pain   . Encounter for feeding tube placement   . Respiratory failure ([place])   . Status post tracheostomy ([place])   . Acute respiratory failure with hypoxemia ([place])   . Stenosis of left carotid artery   . Acute CVA (cerebrovascular accident) ([place]) 05/31/2017  . Type II diabetes mellitus with complication ([place]) 81/19/1478  . Hypertension 05/31/2017  . Hyponatremia 05/31/2017  . IIH (idiopathic intracranial hypertension) 01/18/2016  . Malfunction of ventriculo-peritoneal shunt ([place]) 01/18/2016  . 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 Worsening headaches 01/18/2016  . Increased abdominal girth 01/18/2016  . Abdominal fluid collection 01/18/2016  . Vision changes 01/18/2016  .  Perceived hearing changes 01/18/2016  . Nausea without vomiting 01/18/2016  . Peripheral vision loss 01/18/2016  . Imbalance 01/18/2016  . Falls 01/18/2016  . Dysphagia, oropharyngeal 01/18/2016  . Subacute confusional state 01/18/2016  . Left breast mass 05/05/2011    Current Outpatient Medications on File Prior to Visit  Medication Sig Dispense Refill  . ACCU-CHEK COMPACT PLUS test strip   11  . acetaZOLAMIDE (DIAMOX) 250 MG tablet Take 1 tablet (250 mg total) by mouth 3 (three) times daily. 90 tablet 12  . aspirin 325 MG tablet Take 1 tablet (325 mg total) by mouth daily.    . busPIRone (BUSPAR) 5 MG tablet Take 5 mg by mouth 3 (three) times daily.  5  . cloNIDine (CATAPRES - DOSED IN MG/24 HR) 0.2 mg/24hr patch Place 1 patch (0.2 mg total) onto the skin once a week. On wednesdays (Patient taking differently: 0.2 mg 2 (two) times daily. On wednesdays) 4 patch 0  . cloNIDine (CATAPRES) 0.1 MG tablet Take 0.1 mg by mouth 2 (two) times daily.  11  . clopidogrel (PLAVIX) 75 MG tablet Take 1 tablet (75 mg total) by mouth daily with breakfast. 30 tablet 0  . diclofenac sodium (VOLTAREN) 1 % GEL Apply 2 application topically 4 (four) times daily as needed (pain).     Marland Kitchen docusate sodium (COLACE) 100 MG capsule Take 1 capsule (100 mg total) by mouth daily. (Patient taking differently: Take 100 mg by mouth as needed (liquid). )  30 capsule 0  . esomeprazole (NEXIUM) 40 MG capsule Take 40 mg by mouth daily as needed (acid reflux). Acid reflux    . estropipate (OGEN) 1.5 MG tablet Take 0.75 mg by mouth daily.     . insulin glargine (LANTUS) 100 unit/mL SOPN Inject 0.2 mLs (20 Units total) into the skin at bedtime. 15 mL 11  . metFORMIN (GLUMETZA) 500 MG (MOD) 24 hr tablet Take by mouth.    . metoprolol succinate (TOPROL-XL) 100 MG 24 hr tablet Take by mouth.    . metoprolol tartrate (LOPRESSOR) 100 MG tablet Take 1 tablet (100 mg total) by mouth 2 (two) times daily. 60 tablet 1  . PRESCRIPTION  MEDICATION Take 20 mg by mouth See admin instructions. Domperidone 10 mg from [place]: Take 2 tablets (20 mg) by mouth three times daily with meals - for diabetic gastroparesis    . prochlorperazine (COMPAZINE) 5 MG tablet Take 1 tablet (5 mg total) by mouth every 8 (eight) hours as needed for nausea or vomiting. Have been taking it with meals--wean as able 90 tablet 0  . ranitidine (ZANTAC) 75 MG tablet TAKE 1 TO 2 TABLETS BY MOUTH ONCE DAILY AS NEEDED  30-60 MINUTES BEFORE A MEAL  2  . sertraline (ZOLOFT) 25 MG tablet Take 1 tablet (25 mg total) by mouth daily. 30 tablet 11  . topiramate (TOPAMAX) 50 MG tablet Take 1 tablet (50 mg total) by mouth 2 (two) times daily. 60 tablet 0  . traMADol (ULTRAM) 50 MG tablet Take 1 tablet (50 mg total) by mouth every 6 (six) hours as needed. 30 tablet 0  . Water For Irrigation, Sterile (FREE WATER) SOLN Take 200 mLs by mouth every 8 (eight) hours. Use filtered water     No current facility-administered medications on file prior to visit.     Allergies  Allergen Reactions  . Carbamazepine Hives, Shortness Of Breath and Other (See Comments)  . Penicillins Anaphylaxis, Other (See Comments) and Swelling    Mother, father and brother have history of anaphylaxis reaction to penicillin so pt does not take  My throat swells Familial history of anaphylaxis   . Statins Other (See Comments)    Severe muscle weakness, leg numbness, severe headaches, chest pain   . Tricyclic Antidepressants Other (See Comments)    IMPAIRED MEMORY  . Atorvastatin Other (See Comments)    Severe muscle weakness, leg numbness, severe headaches, chest pain   . Ezetimibe Other (See Comments)    Severe muscle weakness, leg numbness, severe headaches, chest pain   . Nortriptyline Other (See Comments)    IMPAIRED MEMORY  . Quinapril Hcl Other (See Comments)    Unknown reaction  . Rifampin Diarrhea  . Metoclopramide Nausea And Vomiting and Rash    Objective: Physical  Exam  General: Well developed, nourished, no acute distress, awake, alert and oriented x 3  Vascular: Dorsalis pedis artery 1/4 bilateral, Posterior tibial artery 1/4 bilateral, skin temperature warm tocool proximal to distal bilateral lower extremities, + varicosities, no pedal hair present bilateral.  Neurological: Gross sensation present via light touch bilateral.   Dermatological: Skin is warm, dry, and supple bilateral, Nails 1-10 are tender, long, thick, and discolored with mild subungal debris,, no webspace macerations present bilateral, no open lesions present bilateral, minimal  hyperkeratotic tissue at distal right great toe. No signs of infection bilateral.  Musculoskeletal: Mild pain to toes. No pain with calf compression bilateral. Weakness R>L due to stroke.  Assessment and Plan:  Problem  List Items Addressed This Visit      Endocrine   Diabetes mellitus ([place])   Relevant Medications   metFORMIN (GLUMETZA) 500 MG (MOD) 24 hr tablet     Other   Weakness    Other Visit Diagnoses    Pre-ulcerative calluses    -  Primary   Pain due to onychomycosis of toenails of both feet       Coagulopathy (HCC)       History of embolic stroke          -Examined patient.  -Discussed treatment options for painful mycotic nails and pre-ulcerative callus. -Mechanically debrided and reduced mycotic nails with sterile nail nipper and dremel nail file without incident. -Recommend continue with bracing and protection of toes -May use OTC pain cream to toes at bedtime -Patient to return in 3 months for follow up evaluation or sooner if symptoms worsen.  Landis Martins, DPM

## 2018-05-18 ENCOUNTER — Ambulatory Visit: Payer: Medicare Other | Admitting: Neurology

## 2018-05-18 ENCOUNTER — Encounter: Payer: Self-pay | Admitting: Neurology

## 2018-05-18 VITALS — BP 152/96 | HR 71 | Ht 62.0 in | Wt 136.0 lb

## 2018-05-18 DIAGNOSIS — F4321 Adjustment disorder with depressed mood: Secondary | ICD-10-CM

## 2018-05-18 DIAGNOSIS — I69359 Hemiplegia and hemiparesis following cerebral infarction affecting unspecified side: Secondary | ICD-10-CM

## 2018-05-18 MED ORDER — SERTRALINE HCL 50 MG PO TABS: 50.0000 mg | ORAL_TABLET | Freq: Every day | ORAL | 11 refills | Status: DC

## 2018-05-18 NOTE — Patient Instructions (Signed)
Increase Zoloft Stop Aspirin and continue Plavix

## 2018-05-18 NOTE — Progress Notes (Signed)
[facility]    Provider:  Dr Jaynee Eagles Referring Provider: [name], MD Primary Care Physician:  [name], MD  CC:  Stroke  Interval history: She feels improved. She cannot afford more therapy at this time. She feels her speech is improved. She is walking at home, she walks out to the car. No falls. Feels memory improving. Strength is improving. Here with her husband and he provides information. She is on Aspirin and Plavix. She can stop ASA and continue with Plavix. Feels her headaches are stable.   Interval history 11/02/2017:  [name] is a 67 y.o. female here as a referral from Dr. Felipa Eth for stroke. She has a past medical history of chronic refractory headaches, pseudotumor cerebri status post lumboperotoneal shunt, peripheral neuropathy, hypertension, uncontrolled hypercholesterolemia(last ldl 211), uncontrolled diabetes(Hgba1c last 9.5). She has been to physical and occupational therapy. She did not feel that Physical therapy did anything for her. She had an OT session. She had a PT session outpatient. She broke her wrist in a fall, her wrist was set wrong and the tendon was damaged. She has difficulty with the wrist so can;t use the walker. She has not had much PT since her stroke. She has declined physically because she has not been mobile.  Recommend family therapy.  HPI:  [name] is a 67 y.o. female here as a referral from Dr. Felipa Eth for stroke. She has a past medical history of chronic refractory headaches, pseudotumor cerebri status post lumboperotoneal shunt, peripheral neuropathy, hypertension, uncontrolled hypercholesterolemia(last ldl 211), uncontrolled diabetes(Hgba1c last 9.5). Review of records shows that patient was sent to the emergency room mid-August for right hand grip strength weakness. MRI showed acute and subacute infarcts in the left hemisphere in the left MCA distribution raising possibility of embolic source. CTA of  the head and neck showed a large amount of noncalcified plaque in the proximal left internal carotid artery causing 60-65% stenosis and this was diagnosed as the likely etiology of emboli. Vascular was consulted for possible left carotid endarterectomy. A cerebral angiogram was ordered. During the procedure, patient had a sudden decline in mental status, became unresponsive, had roving eye movements, dilated and reactive pupils, bilateral upper extremity tonic posturing, diaphoresis taken immediately for stat CT of the head and CT head and neck. There were multiple thrombi in the bilateral M1 and proximal M2 segments bilaterally and emergent mechanical thrombectomy was performed. Patient status post complete revascularization of bilateral M1 segments with reperfusion bilaterally. EEG showed mild generalized cerebral dysfunction, intermittent arm twitching was not associated with any epileptiform activity. Repeat MRI showed numerous acute new supra-and infratentorial infarct spanning multiple vascular territories consistent with embolic phenomena  She is scheduled for OT/PT/Speech at home.   She was trached, and was sent to inpatient rehabilitation at the end of August. She had intensive physical therapy, speech therapy and occupational therapy. Topamax was added to help manage persistent headache and titrated upwards without side effects. Her blood pressure was managed. She did require PEG placement.   Patient is here with daughter, is understandably distressed and both are crying. Reviewed imaging with patient, future plan including dual antiplatelet therapy for 3 months and then continuing with Plavix alone. She needs to strictly manage her hyperlipidemia and she was asked to discuss treatment with her primary care physician. Patient is continuing physical therapy, occupational therapy and speech therapy. Tracheostomy and PEG have been removed. Reassured patient that she can she see improvements even  upwards of 6 months  to a year after stroke and encouraged her to continue working hard in therapy.   Review of Systems: Patient complains of symptoms per HPI as well as the following symptoms: depression, anxiety, weakness, fatigue, nausea, headache. Pertinent negatives and positives per HPI. All others negative.      Social History   Socioeconomic History  . Marital status: Married    Spouse name: Simona Huh  . Number of children: 2  . Years of education: 74  . Highest education level: Not on file  Occupational History  . Not on file  Social Needs  . Financial resource strain: Not on file  . Food insecurity:    Worry: Not on file    Inability: Not on file  . Transportation needs:    Medical: Not on file    Non-medical: Not on file  Tobacco Use  . Smoking status: Never Smoker  . Smokeless tobacco: Never Used  Substance and Sexual Activity  . Alcohol use: Yes    Comment: occasional beer  . Drug use: No  . Sexual activity: Not on file  Lifestyle  . Physical activity:    Days per week: Not on file    Minutes per session: Not on file  . Stress: Not on file  Relationships  . Social connections:    Talks on phone: Not on file    Gets together: Not on file    Attends religious service: Not on file    Active member of club or organization: Not on file    Attends meetings of clubs or organizations: Not on file    Relationship status: Not on file  . Intimate partner violence:    Fear of current or ex partner: Not on file    Emotionally abused: Not on file    Physically abused: Not on file    Forced sexual activity: Not on file  Other Topics Concern  . Not on file  Social History Narrative   Lives at home with husband and daughter   Caffeine use: soda daily   Right handed    Family History  Problem Relation Age of Onset  . Heart disease Father   . Migraines Neg Hx     Past Medical History:  Diagnosis Date  . Anemia    takes iron supplement  . Anesthesia  complication    disorientation due to pseudotumor  . Diabetes mellitus    IDDM  . Family history of anesthesia complication 21 yrs ago   brother stopped breathing for a minute or two  . GERD (gastroesophageal reflux disease)   . Headache(784.0)    due to pseudotumor; daily headache  . Hypercholesteremia    unable to tolerate statins  . Hypertension    under control with meds., has been on med. x 5 yr.  . Peripheral neuropathy   . PONV (postoperative nausea and vomiting)   . 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 Seizures ([place])    due to shunt failure; last seizure 2007  . Synovitis of ankle 04/2013   left  . Wears dentures    full    Past Surgical History:  Procedure Laterality Date  . ANKLE ARTHROSCOPY Left 05/18/2013   Procedure: LEFT ANKLE ARTHROSCOPY WITH DEBRIDEMENT,  SUBTALAR OPEN DEBRIDEMENT ;  Surgeon: Wylene Simmer, MD;  Location: [facility];  Service: Orthopedics;  Laterality: Left;  . APPENDECTOMY     as a child  . BALLOON DILATION N/A 07/04/2013   Procedure:  BALLOON DILATION;  Surgeon: Garlan Fair, MD;  Location: [facility];  Service: Endoscopy;  Laterality: N/A;  . BLADDER SUSPENSION    . BREAST LUMPECTOMY W/ NEEDLE LOCALIZATION Left 05/22/2011  . CAROTID ANGIOGRAPHY Bilateral 06/03/2017   Procedure: Bilateral Carotid Angiography;  Surgeon: Conrad [place], MD;  Location: [facility];  Service: Cardiovascular;  Laterality: Bilateral;  . COLONOSCOPY WITH PROPOFOL N/A 05/14/2014   Procedure: COLONOSCOPY WITH PROPOFOL;  Surgeon: Garlan Fair, MD;  Location: [facility];  Service: Endoscopy;  Laterality: N/A;  . ESOPHAGEAL DILATION  06/23/2006; 08/05/2004  . ESOPHAGOGASTRODUODENOSCOPY N/A 07/04/2013   Procedure: ESOPHAGOGASTRODUODENOSCOPY (EGD);  Surgeon: Garlan Fair, MD;  Location: [facility];  Service: Endoscopy;  Laterality: N/A;  . ESOPHAGOGASTRODUODENOSCOPY (EGD) WITH PROPOFOL N/A 05/14/2014   Procedure:  ESOPHAGOGASTRODUODENOSCOPY (EGD) WITH PROPOFOL;  Surgeon: Garlan Fair, MD;  Location: [facility];  Service: Endoscopy;  Laterality: N/A;  . IR ANGIO VERTEBRAL SEL SUBCLAVIAN INNOMINATE UNI R MOD SED  06/03/2017  . IR GASTROSTOMY TUBE MOD SED  06/25/2017  . IR GASTROSTOMY TUBE REMOVAL  08/20/2017  . IR PERCUTANEOUS ART THROMBECTOMY/INFUSION INTRACRANIAL INC DIAG ANGIO  06/03/2017  . IR PERCUTANEOUS ART THROMBECTOMY/INFUSION INTRACRANIAL INC DIAG ANGIO  06/03/2017  . IR RADIOLOGIST EVAL & MGMT  09/07/2017  . LUMBAR PERITONEAL SHUNT     x 2  . OVARIAN CYST REMOVAL     age 69  . RADIOLOGY WITH ANESTHESIA N/A 06/03/2017   Procedure: RADIOLOGY WITH ANESTHESIA;  Surgeon: Luanne Bras, MD;  Location: [facility];  Service: Radiology;  Laterality: N/A;  . SHUNT REVISION  2007  . TOTAL ABDOMINAL HYSTERECTOMY W/ BILATERAL SALPINGOOPHORECTOMY  1994   age 38s  . WRIST SURGERY Left 2014    [name]    Current Outpatient Medications  Medication Sig Dispense Refill  . busPIRone (BUSPAR) 5 MG tablet Take 5 mg by mouth 3 (three) times daily.  5  . CARTIA XT 180 MG 24 hr capsule Take 180 mg by mouth daily.  8  . cloNIDine (CATAPRES) 0.1 MG tablet Take 0.1 mg by mouth 2 (two) times daily.  11  . clopidogrel (PLAVIX) 75 MG tablet Take 1 tablet (75 mg total) by mouth daily with breakfast. 30 tablet 0  . esomeprazole (NEXIUM) 40 MG capsule Take 40 mg by mouth daily as needed (acid reflux). Acid reflux    . insulin glargine (LANTUS) 100 unit/mL SOPN Inject 0.2 mLs (20 Units total) into the skin at bedtime. (Patient taking differently: Inject 22 Units into the skin at bedtime. ) 15 mL 11  . metFORMIN (GLUMETZA) 500 MG (MOD) 24 hr tablet Take by mouth.    . metoprolol succinate (TOPROL-XL) 100 MG 24 hr tablet Take by mouth.    Marland Kitchen PRESCRIPTION MEDICATION Take 20 mg by mouth See admin instructions. Domperidone 10 mg from [place]: Take 2 tablets (20 mg) by mouth three times daily with meals - for diabetic  gastroparesis    . prochlorperazine (COMPAZINE) 5 MG tablet Take 1 tablet (5 mg total) by mouth every 8 (eight) hours as needed for nausea or vomiting. Have been taking it with meals--wean as able 90 tablet 0  . ranitidine (ZANTAC) 75 MG tablet TAKE 1 TO 2 TABLETS BY MOUTH ONCE DAILY AS NEEDED  30-60 MINUTES BEFORE A MEAL  2  . sertraline (ZOLOFT) 50 MG tablet Take 1 tablet (50 mg total) by mouth daily. 30 tablet 11  . topiramate (TOPAMAX) 50 MG tablet Take 1 tablet (50  mg total) by mouth 2 (two) times daily. 60 tablet 0  . ACCU-CHEK COMPACT PLUS test strip   11  . acetaZOLAMIDE (DIAMOX) 250 MG tablet Take 1 tablet (250 mg total) by mouth 3 (three) times daily. 90 tablet 12  . diclofenac sodium (VOLTAREN) 1 % GEL Apply 2 application topically 4 (four) times daily as needed (pain).     Marland Kitchen VICTOZA 18 MG/3ML SOPN INJECT 0.6MG  INTO THE SKIN ONCE A DAY FOR 2 WEEKS THEN 1.2MG   11   No current facility-administered medications for this visit.     Allergies as of 05/18/2018 - Review Complete 05/18/2018  Allergen Reaction Noted  . Carbamazepine Hives, Shortness Of Breath, and Other (See Comments) 12/29/2010  . Penicillins Anaphylaxis, Other (See Comments), and Swelling 05/05/2011  . Statins Other (See Comments) 05/05/2011  . Tricyclic antidepressants Other (See Comments) 05/05/2011  . Atorvastatin Other (See Comments) 12/29/2010  . Ezetimibe Other (See Comments) 12/29/2010  . Nortriptyline Other (See Comments) 06/28/2014  . Quinapril hcl Other (See Comments) 12/29/2010  . Rifampin Diarrhea 01/22/2012  . Metoclopramide Nausea And Vomiting and Rash 12/29/2010    Vitals: BP (!) 152/96 (BP Location: Right Arm, Patient Position: Sitting)   Pulse 71   Ht 5\' 2"  (1.575 m)   Wt 136 lb (61.7 kg)   BMI 24.87 kg/m  Last Weight:  Wt Readings from Last 1 Encounters:  05/18/18 136 lb (61.7 kg)   Last Height:   Ht Readings from Last 1 Encounters:  05/18/18 5\' 2"  (1.575 m)       Gen: crying,  distressed                  CV: RRR, no MRG. No Carotid Bruits. No peripheral edema, warm, nontender Eyes: Conjunctivae clear without exudates or hemorrhage  Neuro: Detailed Neurologic Exam  Speech:    Mild dysarthria, improved Cognition:    The patient is oriented to person, place, and time;     recent and remote memory intact;   Cranial Nerves:    The pupils are equal, round, and reactive to light. right homonymous hemianopia . Extraocular movements are intact. Trigeminal sensation is intact and the muscles of mastication are normal. Mild left facial droopThe palate elevates in the midline. Hearing intact. Voice is normal. Shoulder shrug is normal. The tongue has normal motion without fasciculations.   Motor Observation:    no involuntary movements noted.    Strength:  Right arm 3/5, right prox extremity 3/5, left prox extremity 3+/5, .dstally in the lower extremities the strength is 4+-5-/5. These are stable possibly slightly increased strength distally in the lower extremities.     Patient has right hemiparesis 3+ to 4/5 throughout on the right, left is essential normal  Increased tone right arm > right leg stable        Assessment/Plan:   67 y.o. female here as a referral from Dr. Felipa Eth for stroke. She has a past medical history of chronic refractory headaches, pseudotumor cerebri status post lumboperotoneal shunt, peripheral neuropathy, hypertension, hypercholesterolemia, uncontrolled diabetes. Patient was [facility] to [facility] in mid August due to new onset right hand weakness and acute embolic strokes seen on MRI. Cerebral angiogram was performed to evaluate left internal carotid artery stenosis and possible carotid endarterectomy, during cerebral angiogram patient had acute mental status decline found to have bilateral M1 thrombus status post mechanical thrombectomy. Follow-up MRI showed numerous acute new supratentorial and infratentorial infarcts bilaterally  spanning multiple vascular territories embolic phenomena. Follow-up vascular  studies showed left ICA likely less than 50% stenosis and no intervention needed at this time but recommended dual antiplatelet therapy.   She feels improved with speech, motor and mood.   Continue home therapy for spasticity as PT/OT showed patient and husband  Increase the Zoloft mg daily. Can slowly increase to 100mg  daily for adjustment disorder with depression  Therapy and counseling Dr. Eino Farber or otherwise, can call insurance and see if there are therapists paid for on plan  Stop asa and continue plavix  Pt, OT:  They decline  Consider Botox for spasticity in the future if needed   I had a long d/w patient about her recent stroke, risk for recurrent stroke/TIAs, personally independently reviewed imaging studies and stroke evaluation results and answered questions.Continue Plavix for secondary stroke prevention for 3 months and then Plavix alone, and maintain strict control of hypertension with blood pressure goal below 130/90, diabetes with hemoglobin A1c goal below 7% and lipids with LDL cholesterol goal below 70 mg/dL.Patient has h/o statin intolerance hence recommend new PCSK9 inhibitor like Praluent. Recommend she see her primary MD to get prescription. I also advised the patient to eat a healthy diet with plenty of whole grains, cereals, fruits and vegetables, exercise regularly and maintain ideal body weight.  Continue physical therapy, occupational therapy and speech therapy.  Continue with Dr.Kierstein is in physical rehabilitation   .Followup with me in 4-6 months or call earlier if necessary.  Sarina Ill, MD  [facility] [address] [place], [place] [address]  Phone [phone] Fax [phone]  A total of twenty minutes minutes was spent face-to-face with this patient. Over half this time was spent on counseling patient on the adjustment disorder,  stroke  diagnosis and different diagnostic and therapeutic options available.

## 2018-06-14 ENCOUNTER — Encounter: Payer: Self-pay | Admitting: Speech Pathology

## 2018-06-14 NOTE — Therapy (Signed)
SPEECH THERAPY DISCHARGE SUMMARY  Visits from Start of Care: 6  Current functional level related to goals / functional outcomes: Pt attended 6 speech therapy sessions addressing cognitive-communication impairments and dysphagia. See goals below.    Remaining deficits: Pt was tolerating regular diet and thin liquids with precautions at time of last therapy session. Deficits persisted in attention, awareness, and memory, as well as mild dysarthria.    Education / Equipment: Memory strategies provided, swallow precautions Plan: Patient agrees to discharge.  Patient goals were not met. Patient is being discharged due to not returning since the last visit.  ?????           SLP Short Term Goals - 06/14/18 1218      SLP SHORT TERM GOAL #1   Title  pt will demo selective attention for 10 minutes in min noisy environment in simple cognitive linguistic task over three sessions    Status  Partially Met      SLP [place] #2   Title  pt will tell SLP 3 non-physical deficits without cues    Status  Achieved      SLP SHORT TERM GOAL #3   Title  Pt will tell SLP 4 memory strategies with occasional min A    Status  Not Met      SLP SHORT TERM GOAL #4   Title  pt will perform safe swallow strategies with POs, given occasional min A over 3 sessions    Status  Partially Met      SLP SHORT TERM GOAL #5   Title  pt will demo HEP for swallowing with occasional min A    Status  Partially Met      SLP Long Term Goals - 06/14/18 1219      SLP LONG TERM GOAL #1   Title  pt will alternate attention with simple-mod complex cognitive linguistic tasks with at least 90% overall with self correction allowed, over three sessions    Status  Not Met      SLP LONG TERM GOAL #2   Title  pt will demo emergent awareness by self correcting 100% of errors on simple-mod complec linguistic tasks with verbal cues, rarely    Status  Not Met      SLP LONG TERM GOAL #3   Title  pt will utilize a  memory strategy in 5 ST sessions with nonverbal cues    Status  Not Met      SLP LONG TERM GOAL #4   Title  pt will perform swallow HEP with modified independence    Status  Not Met      SLP LONG TERM GOAL #5   Title  pt will perform swallow precautions over 2 sessions    Status  Not [facility] [address] [place], [place], [address] Phone: [phone]   Fax:  [phone]  Patient Details  Name: [name] MRN: [id number] Date of Birth: 1951/06/01 Referring Provider:  No ref. provider found  Encounter Date: 06/14/2018  Deneise Lever, [place], [place] 06/14/2018, 12:20 PM  [facility] [address] [place], [place], [address] Phone: [phone]   Fax:  [phone]

## 2018-07-13 ENCOUNTER — Inpatient Hospital Stay (HOSPITAL_COMMUNITY)
Admission: EM | Admit: 2018-07-13 | Discharge: 2018-08-16 | DRG: 064 | Disposition: A | Discharge status: Hospice | Payer: Medicare Other | Attending: Internal Medicine | Admitting: Internal Medicine

## 2018-07-13 ENCOUNTER — Emergency Department (HOSPITAL_COMMUNITY): Payer: Medicare Other

## 2018-07-13 ENCOUNTER — Encounter (HOSPITAL_COMMUNITY): Payer: Self-pay | Admitting: Neurology

## 2018-07-13 DIAGNOSIS — D696 Thrombocytopenia, unspecified: Secondary | ICD-10-CM | POA: Diagnosis present

## 2018-07-13 DIAGNOSIS — Z794 Long term (current) use of insulin: Secondary | ICD-10-CM

## 2018-07-13 DIAGNOSIS — E0800 Diabetes mellitus due to underlying condition with hyperosmolarity without nonketotic hyperglycemic-hyperosmolar coma (NKHHC): Secondary | ICD-10-CM | POA: Diagnosis not present

## 2018-07-13 DIAGNOSIS — E860 Dehydration: Secondary | ICD-10-CM | POA: Diagnosis present

## 2018-07-13 DIAGNOSIS — R1310 Dysphagia, unspecified: Secondary | ICD-10-CM

## 2018-07-13 DIAGNOSIS — D638 Anemia in other chronic diseases classified elsewhere: Secondary | ICD-10-CM | POA: Diagnosis present

## 2018-07-13 DIAGNOSIS — R4182 Altered mental status, unspecified: Secondary | ICD-10-CM | POA: Diagnosis not present

## 2018-07-13 DIAGNOSIS — J9601 Acute respiratory failure with hypoxia: Secondary | ICD-10-CM | POA: Diagnosis not present

## 2018-07-13 DIAGNOSIS — Z0189 Encounter for other specified special examinations: Secondary | ICD-10-CM | POA: Diagnosis not present

## 2018-07-13 DIAGNOSIS — E1159 Type 2 diabetes mellitus with other circulatory complications: Secondary | ICD-10-CM | POA: Diagnosis not present

## 2018-07-13 DIAGNOSIS — G932 Benign intracranial hypertension: Secondary | ICD-10-CM | POA: Diagnosis present

## 2018-07-13 DIAGNOSIS — R233 Spontaneous ecchymoses: Secondary | ICD-10-CM | POA: Diagnosis present

## 2018-07-13 DIAGNOSIS — Z66 Do not resuscitate: Secondary | ICD-10-CM | POA: Diagnosis not present

## 2018-07-13 DIAGNOSIS — T17908A Unspecified foreign body in respiratory tract, part unspecified causing other injury, initial encounter: Secondary | ICD-10-CM

## 2018-07-13 DIAGNOSIS — R29709 NIHSS score 9: Secondary | ICD-10-CM | POA: Diagnosis not present

## 2018-07-13 DIAGNOSIS — Z515 Encounter for palliative care: Secondary | ICD-10-CM | POA: Diagnosis present

## 2018-07-13 DIAGNOSIS — E7220 Disorder of urea cycle metabolism, unspecified: Secondary | ICD-10-CM | POA: Diagnosis not present

## 2018-07-13 DIAGNOSIS — E782 Mixed hyperlipidemia: Secondary | ICD-10-CM | POA: Diagnosis not present

## 2018-07-13 DIAGNOSIS — E1165 Type 2 diabetes mellitus with hyperglycemia: Secondary | ICD-10-CM | POA: Diagnosis present

## 2018-07-13 DIAGNOSIS — E119 Type 2 diabetes mellitus without complications: Secondary | ICD-10-CM

## 2018-07-13 DIAGNOSIS — J690 Pneumonitis due to inhalation of food and vomit: Secondary | ICD-10-CM | POA: Diagnosis present

## 2018-07-13 DIAGNOSIS — G9389 Other specified disorders of brain: Secondary | ICD-10-CM | POA: Diagnosis present

## 2018-07-13 DIAGNOSIS — N179 Acute kidney failure, unspecified: Secondary | ICD-10-CM | POA: Diagnosis present

## 2018-07-13 DIAGNOSIS — Z7189 Other specified counseling: Secondary | ICD-10-CM | POA: Diagnosis not present

## 2018-07-13 DIAGNOSIS — I639 Cerebral infarction, unspecified: Secondary | ICD-10-CM | POA: Diagnosis present

## 2018-07-13 DIAGNOSIS — Z8673 Personal history of transient ischemic attack (TIA), and cerebral infarction without residual deficits: Secondary | ICD-10-CM | POA: Diagnosis not present

## 2018-07-13 DIAGNOSIS — I63232 Cerebral infarction due to unspecified occlusion or stenosis of left carotid arteries: Secondary | ICD-10-CM | POA: Diagnosis not present

## 2018-07-13 DIAGNOSIS — Z880 Allergy status to penicillin: Secondary | ICD-10-CM

## 2018-07-13 DIAGNOSIS — G40101 Localization-related (focal) (partial) symptomatic epilepsy and epileptic syndromes with simple partial seizures, not intractable, with status epilepticus: Secondary | ICD-10-CM | POA: Diagnosis not present

## 2018-07-13 DIAGNOSIS — G9341 Metabolic encephalopathy: Secondary | ICD-10-CM | POA: Diagnosis not present

## 2018-07-13 DIAGNOSIS — E118 Type 2 diabetes mellitus with unspecified complications: Secondary | ICD-10-CM | POA: Diagnosis not present

## 2018-07-13 DIAGNOSIS — E872 Acidosis: Secondary | ICD-10-CM | POA: Diagnosis present

## 2018-07-13 DIAGNOSIS — I63233 Cerebral infarction due to unspecified occlusion or stenosis of bilateral carotid arteries: Secondary | ICD-10-CM | POA: Diagnosis not present

## 2018-07-13 DIAGNOSIS — E11649 Type 2 diabetes mellitus with hypoglycemia without coma: Secondary | ICD-10-CM | POA: Diagnosis not present

## 2018-07-13 DIAGNOSIS — R627 Adult failure to thrive: Secondary | ICD-10-CM

## 2018-07-13 DIAGNOSIS — R29708 NIHSS score 8: Secondary | ICD-10-CM | POA: Diagnosis present

## 2018-07-13 DIAGNOSIS — G40901 Epilepsy, unspecified, not intractable, with status epilepticus: Secondary | ICD-10-CM

## 2018-07-13 DIAGNOSIS — G912 (Idiopathic) normal pressure hydrocephalus: Secondary | ICD-10-CM | POA: Diagnosis present

## 2018-07-13 DIAGNOSIS — K219 Gastro-esophageal reflux disease without esophagitis: Secondary | ICD-10-CM | POA: Diagnosis present

## 2018-07-13 DIAGNOSIS — G9340 Encephalopathy, unspecified: Secondary | ICD-10-CM | POA: Diagnosis not present

## 2018-07-13 DIAGNOSIS — Z888 Allergy status to other drugs, medicaments and biological substances status: Secondary | ICD-10-CM

## 2018-07-13 DIAGNOSIS — Z4659 Encounter for fitting and adjustment of other gastrointestinal appliance and device: Secondary | ICD-10-CM

## 2018-07-13 DIAGNOSIS — I10 Essential (primary) hypertension: Secondary | ICD-10-CM | POA: Diagnosis present

## 2018-07-13 DIAGNOSIS — Z01818 Encounter for other preprocedural examination: Secondary | ICD-10-CM | POA: Diagnosis not present

## 2018-07-13 DIAGNOSIS — I6523 Occlusion and stenosis of bilateral carotid arteries: Secondary | ICD-10-CM | POA: Diagnosis not present

## 2018-07-13 DIAGNOSIS — G936 Cerebral edema: Secondary | ICD-10-CM | POA: Diagnosis present

## 2018-07-13 DIAGNOSIS — G8191 Hemiplegia, unspecified affecting right dominant side: Secondary | ICD-10-CM | POA: Diagnosis not present

## 2018-07-13 DIAGNOSIS — R29723 NIHSS score 23: Secondary | ICD-10-CM | POA: Diagnosis not present

## 2018-07-13 DIAGNOSIS — R1115 Cyclical vomiting syndrome unrelated to migraine: Secondary | ICD-10-CM

## 2018-07-13 DIAGNOSIS — R069 Unspecified abnormalities of breathing: Secondary | ICD-10-CM

## 2018-07-13 DIAGNOSIS — R569 Unspecified convulsions: Secondary | ICD-10-CM | POA: Diagnosis not present

## 2018-07-13 DIAGNOSIS — G40219 Localization-related (focal) (partial) symptomatic epilepsy and epileptic syndromes with complex partial seizures, intractable, without status epilepticus: Secondary | ICD-10-CM | POA: Diagnosis not present

## 2018-07-13 DIAGNOSIS — I69391 Dysphagia following cerebral infarction: Secondary | ICD-10-CM | POA: Diagnosis not present

## 2018-07-13 DIAGNOSIS — Z9181 History of falling: Secondary | ICD-10-CM

## 2018-07-13 DIAGNOSIS — J9600 Acute respiratory failure, unspecified whether with hypoxia or hypercapnia: Secondary | ICD-10-CM

## 2018-07-13 DIAGNOSIS — R0602 Shortness of breath: Secondary | ICD-10-CM

## 2018-07-13 DIAGNOSIS — J9690 Respiratory failure, unspecified, unspecified whether with hypoxia or hypercapnia: Secondary | ICD-10-CM

## 2018-07-13 DIAGNOSIS — I6522 Occlusion and stenosis of left carotid artery: Secondary | ICD-10-CM | POA: Diagnosis not present

## 2018-07-13 DIAGNOSIS — R1110 Vomiting, unspecified: Secondary | ICD-10-CM

## 2018-07-13 DIAGNOSIS — E1142 Type 2 diabetes mellitus with diabetic polyneuropathy: Secondary | ICD-10-CM | POA: Diagnosis present

## 2018-07-13 DIAGNOSIS — Z79899 Other long term (current) drug therapy: Secondary | ICD-10-CM

## 2018-07-13 DIAGNOSIS — R4701 Aphasia: Secondary | ICD-10-CM | POA: Diagnosis present

## 2018-07-13 DIAGNOSIS — E785 Hyperlipidemia, unspecified: Secondary | ICD-10-CM | POA: Diagnosis present

## 2018-07-13 DIAGNOSIS — R1312 Dysphagia, oropharyngeal phase: Secondary | ICD-10-CM | POA: Diagnosis not present

## 2018-07-13 DIAGNOSIS — E875 Hyperkalemia: Secondary | ICD-10-CM | POA: Diagnosis not present

## 2018-07-13 DIAGNOSIS — G249 Dystonia, unspecified: Secondary | ICD-10-CM | POA: Diagnosis present

## 2018-07-13 DIAGNOSIS — T17908D Unspecified foreign body in respiratory tract, part unspecified causing other injury, subsequent encounter: Secondary | ICD-10-CM | POA: Diagnosis not present

## 2018-07-13 DIAGNOSIS — E876 Hypokalemia: Secondary | ICD-10-CM | POA: Diagnosis present

## 2018-07-13 DIAGNOSIS — Z8249 Family history of ischemic heart disease and other diseases of the circulatory system: Secondary | ICD-10-CM

## 2018-07-13 DIAGNOSIS — E781 Pure hyperglyceridemia: Secondary | ICD-10-CM | POA: Diagnosis present

## 2018-07-13 DIAGNOSIS — Z9049 Acquired absence of other specified parts of digestive tract: Secondary | ICD-10-CM

## 2018-07-13 DIAGNOSIS — Z7902 Long term (current) use of antithrombotics/antiplatelets: Secondary | ICD-10-CM

## 2018-07-13 DIAGNOSIS — Z978 Presence of other specified devices: Secondary | ICD-10-CM

## 2018-07-13 LAB — COMPREHENSIVE METABOLIC PANEL
ALBUMIN: 4 g/dL (ref 3.5–5.0)
ALT: 23 U/L (ref 0–44)
AST: 27 U/L (ref 15–41)
Alkaline Phosphatase: 121 U/L (ref 38–126)
Anion gap: 14 (ref 5–15)
BILIRUBIN TOTAL: 0.4 mg/dL (ref 0.3–1.2)
BUN: 38 mg/dL — ABNORMAL HIGH (ref 8–23)
CALCIUM: 9.9 mg/dL (ref 8.9–10.3)
CO2: 24 mmol/L (ref 22–32)
CREATININE: 1.27 mg/dL — AB (ref 0.44–1.00)
Chloride: 100 mmol/L (ref 98–111)
GFR calc Af Amer: 50 mL/min — ABNORMAL LOW (ref >60)
GFR calc non Af Amer: 43 mL/min — ABNORMAL LOW (ref >60)
GLUCOSE: 130 mg/dL — AB (ref 70–99)
Potassium: 3.2 mmol/L — ABNORMAL LOW (ref 3.5–5.1)
SODIUM: 138 mmol/L (ref 135–145)
Total Protein: 7.2 g/dL (ref 6.5–8.1)

## 2018-07-13 LAB — I-STAT CHEM 8, ED
BUN: 40 mg/dL — AB (ref 8–23)
CALCIUM ION: 1.19 mmol/L (ref 1.15–1.40)
CHLORIDE: 103 mmol/L (ref 98–111)
CREATININE: 1.4 mg/dL — AB (ref 0.44–1.00)
GLUCOSE: 128 mg/dL — AB (ref 70–99)
HCT: 44 % (ref 36.0–46.0)
Hemoglobin: 15 g/dL (ref 12.0–15.0)
Potassium: 3.5 mmol/L (ref 3.5–5.1)
Sodium: 138 mmol/L (ref 135–145)
TCO2: 25 mmol/L (ref 22–32)

## 2018-07-13 LAB — DIFFERENTIAL
Abs Immature Granulocytes: 0.1 10*3/uL (ref 0.0–0.1)
Basophils Absolute: 0.1 10*3/uL (ref 0.0–0.1)
Basophils Relative: 1 %
Eosinophils Absolute: 0.2 10*3/uL (ref 0.0–0.7)
Eosinophils Relative: 2 %
IMMATURE GRANULOCYTES: 1 %
LYMPHS ABS: 2.3 10*3/uL (ref 0.7–4.0)
LYMPHS PCT: 22 %
MONO ABS: 0.7 10*3/uL (ref 0.1–1.0)
Monocytes Relative: 6 %
NEUTROS ABS: 7.5 10*3/uL (ref 1.7–7.7)
Neutrophils Relative %: 68 %

## 2018-07-13 LAB — CBC
HEMATOCRIT: 44.7 % (ref 36.0–46.0)
HEMOGLOBIN: 13.8 g/dL (ref 12.0–15.0)
MCH: 27.3 pg (ref 26.0–34.0)
MCHC: 30.9 g/dL (ref 30.0–36.0)
MCV: 88.5 fL (ref 78.0–100.0)
Platelets: 324 10*3/uL (ref 150–400)
RBC: 5.05 MIL/uL (ref 3.87–5.11)
RDW: 14.6 % (ref 11.5–15.5)
WBC: 10.8 10*3/uL — ABNORMAL HIGH (ref 4.0–10.5)

## 2018-07-13 LAB — I-STAT TROPONIN, ED: Troponin i, poc: 0.01 ng/mL (ref 0.00–0.08)

## 2018-07-13 LAB — GLUCOSE, CAPILLARY: Glucose-Capillary: 113 mg/dL — ABNORMAL HIGH (ref 70–99)

## 2018-07-13 LAB — PROTIME-INR
INR: 1
Prothrombin Time: 13.1 seconds (ref 11.4–15.2)

## 2018-07-13 LAB — APTT: APTT: 22 s — AB (ref 24–36)

## 2018-07-13 MED ORDER — INSULIN ASPART 100 UNIT/ML ~~LOC~~ SOLN
0.0000 [IU] | SUBCUTANEOUS | Status: DC
  Administered 2018-07-17: 1 [IU] via SUBCUTANEOUS
  Administered 2018-07-19: 2 [IU] via SUBCUTANEOUS
  Administered 2018-07-19: 1 [IU] via SUBCUTANEOUS
  Administered 2018-07-19 – 2018-07-20 (×5): 2 [IU] via SUBCUTANEOUS
  Administered 2018-07-20: 1 [IU] via SUBCUTANEOUS
  Administered 2018-07-20 (×3): 2 [IU] via SUBCUTANEOUS
  Administered 2018-07-21: 1 [IU] via SUBCUTANEOUS
  Administered 2018-07-21 (×2): 2 [IU] via SUBCUTANEOUS
  Administered 2018-07-21 (×2): 1 [IU] via SUBCUTANEOUS
  Administered 2018-07-22: 2 [IU] via SUBCUTANEOUS
  Administered 2018-07-22: 1 [IU] via SUBCUTANEOUS
  Administered 2018-07-22 – 2018-07-23 (×4): 2 [IU] via SUBCUTANEOUS
  Administered 2018-07-23: 1 [IU] via SUBCUTANEOUS
  Administered 2018-07-23: 2 [IU] via SUBCUTANEOUS
  Administered 2018-07-23 – 2018-07-24 (×2): 1 [IU] via SUBCUTANEOUS
  Administered 2018-07-24: 2 [IU] via SUBCUTANEOUS
  Administered 2018-07-24: 1 [IU] via SUBCUTANEOUS
  Administered 2018-07-24: 2 [IU] via SUBCUTANEOUS
  Administered 2018-07-25: 3 [IU] via SUBCUTANEOUS
  Administered 2018-07-25: 2 [IU] via SUBCUTANEOUS
  Administered 2018-07-25: 1 [IU] via SUBCUTANEOUS
  Administered 2018-07-26: 2 [IU] via SUBCUTANEOUS
  Administered 2018-07-26: 5 [IU] via SUBCUTANEOUS
  Administered 2018-07-26: 3 [IU] via SUBCUTANEOUS
  Administered 2018-07-26 (×3): 2 [IU] via SUBCUTANEOUS
  Administered 2018-07-26: 3 [IU] via SUBCUTANEOUS
  Administered 2018-07-27: 2 [IU] via SUBCUTANEOUS
  Administered 2018-07-27: 7 [IU] via SUBCUTANEOUS
  Administered 2018-07-27: 1 [IU] via SUBCUTANEOUS
  Administered 2018-07-27: 2 [IU] via SUBCUTANEOUS
  Administered 2018-07-27 (×2): 1 [IU] via SUBCUTANEOUS
  Administered 2018-07-28 (×2): 2 [IU] via SUBCUTANEOUS
  Administered 2018-07-28: 3 [IU] via SUBCUTANEOUS

## 2018-07-13 MED ORDER — SODIUM CHLORIDE 0.9 % IV BOLUS
500.0000 mL | Freq: Once | INTRAVENOUS | Status: AC
  Administered 2018-07-13: 500 mL via INTRAVENOUS

## 2018-07-13 MED ORDER — CLOPIDOGREL BISULFATE 75 MG PO TABS: 75.0000 mg | ORAL_TABLET | Freq: Every day | ORAL | Status: DC

## 2018-07-13 MED ORDER — ACETAMINOPHEN 325 MG PO TABS
650.0000 mg | ORAL_TABLET | ORAL | Status: DC | PRN
  Administered 2018-07-16 (×2): 650 mg via ORAL
  Filled 2018-07-13 (×2): qty 2

## 2018-07-13 MED ORDER — POTASSIUM CHLORIDE 10 MEQ/100ML IV SOLN
10.0000 meq | INTRAVENOUS | Status: DC
  Filled 2018-07-13: qty 100

## 2018-07-13 MED ORDER — INSULIN GLARGINE 100 UNIT/ML ~~LOC~~ SOLN
15.0000 [IU] | Freq: Every day | SUBCUTANEOUS | Status: DC
  Administered 2018-07-14 – 2018-07-15 (×2): 15 [IU] via SUBCUTANEOUS
  Filled 2018-07-13 (×3): qty 0.15

## 2018-07-13 MED ORDER — ACETAMINOPHEN 650 MG RE SUPP
650.0000 mg | RECTAL | Status: DC | PRN
  Administered 2018-07-24: 650 mg via RECTAL
  Filled 2018-07-13: qty 1

## 2018-07-13 MED ORDER — SODIUM CHLORIDE 0.9 % IV SOLN
INTRAVENOUS | Status: DC
  Administered 2018-07-14 – 2018-08-11 (×7): via INTRAVENOUS

## 2018-07-13 MED ORDER — STROKE: EARLY STAGES OF RECOVERY BOOK
Freq: Once | Status: AC
  Administered 2018-07-14: 01:00:00

## 2018-07-13 MED ORDER — ACETAMINOPHEN 160 MG/5ML PO SOLN
650.0000 mg | ORAL | Status: DC | PRN
  Administered 2018-07-26 – 2018-08-14 (×21): 650 mg
  Filled 2018-07-13 (×23): qty 20.3

## 2018-07-13 MED ORDER — DEXTROSE-NACL 5-0.45 % IV SOLN
INTRAVENOUS | Status: DC
  Administered 2018-07-13: 19:00:00 via INTRAVENOUS

## 2018-07-13 MED ORDER — SODIUM CHLORIDE 0.9 % IV SOLN
INTRAVENOUS | Status: DC
  Administered 2018-07-13: 14:00:00 via INTRAVENOUS

## 2018-07-13 NOTE — ED Notes (Signed)
Pt. returned from XR. 

## 2018-07-13 NOTE — ED Notes (Signed)
Patient transported to MRI 

## 2018-07-13 NOTE — Consult Note (Addendum)
Neurology Consultation  Reason for Consult: Stroke Referring Physician: Dr. Eulis Foster  History is obtained from: Husband  HPI: [name] is a 67 y.o. female with history of seizure, pseudotumor cerebri, hypertension, hypercholesterolemia, headaches, diabetes.  Per husband, last Thursday she was throwing up and hunsband though it was food poisoning. There was no weakness, however. Today, she was normal at 2 AM and upon waking at 0800 husband noted she was very weak, not talking, could not open jaw or eyes, but could follow instructions. Her husband had to help her dress. He did not note any new weakness or facial droop. She was able to walk to wheel chair and then to a car. She had a MRI as part of her ED assessment, which showed new strokes. No missed doses of Plavix.  MRI brain: 1. Acute multifocal LEFT frontoparietal/MCA territory infarcts 2. Old LEFT greater than RIGHT MCA territory infarcts. Old basal ganglia and cerebellar infarcts. 3. Moderate chronic small vessel ischemic changes. 4. Mild parenchymal brain volume loss.  She also has a history of pseudotumor cerebri, diagnosed in 1999. Has lumboperitoneal shunt. Last shunt revision in 2007.   LKW: 2 AM tpa given?: no, out of window Premorbid modified Rankin scale (mRS):  Stroke scale 3  Past Medical History:  Diagnosis Date  . Anemia    takes iron supplement  . Anesthesia complication    disorientation due to pseudotumor  . Diabetes mellitus    IDDM  . Family history of anesthesia complication 66 yrs ago   brother stopped breathing for a minute or two  . GERD (gastroesophageal reflux disease)   . Headache(784.0)    due to pseudotumor; daily headache  . Hypercholesteremia    unable to tolerate statins  . Hypertension    under control with meds., has been on med. x 5 yr.  . Peripheral neuropathy   . PONV (postoperative nausea and vomiting)   . 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 Seizures ([place])     due to shunt failure; last seizure 2007  . Synovitis of ankle 04/2013   left  . Wears dentures    full     Family History  Problem Relation Age of Onset  . Heart disease Father   . Migraines Neg Hx      Social History:   reports that she has never smoked. She has never used smokeless tobacco. She reports that she drinks alcohol. She reports that she does not use drugs.  Medications  Current Facility-Administered Medications:  .  0.9 %  sodium chloride infusion, , Intravenous, Continuous, Daleen Bo, MD, Stopped at 07/13/18 1815 .  dextrose 5 %-0.45 % sodium chloride infusion, , Intravenous, Continuous, Daleen Bo, MD  Current Outpatient Medications:  .  busPIRone (BUSPAR) 5 MG tablet, Take 5 mg by mouth 3 (three) times daily., Disp: , Rfl: 5 .  CARTIA XT 180 MG 24 hr capsule, Take 180 mg by mouth daily., Disp: , Rfl: 8 .  cloNIDine (CATAPRES) 0.1 MG tablet, Take 0.1 mg by mouth 2 (two) times daily., Disp: , Rfl: 11 .  clopidogrel (PLAVIX) 75 MG tablet, Take 1 tablet (75 mg total) by mouth daily with breakfast., Disp: 30 tablet, Rfl: 0 .  diclofenac sodium (VOLTAREN) 1 % GEL, Apply 2 application topically 4 (four) times daily as needed (pain). , Disp: , Rfl:  .  esomeprazole (NEXIUM) 40 MG capsule, Take 40 mg by mouth daily as needed (acid reflux). Acid reflux, Disp: ,  Rfl:  .  insulin glargine (LANTUS) 100 unit/mL SOPN, Inject 0.2 mLs (20 Units total) into the skin at bedtime. (Patient taking differently: Inject 22 Units into the skin at bedtime. ), Disp: 15 mL, Rfl: 11 .  metFORMIN (GLUMETZA) 500 MG (MOD) 24 hr tablet, Take 500 mg by mouth daily. , Disp: , Rfl:  .  metoprolol succinate (TOPROL-XL) 100 MG 24 hr tablet, Take 100 mg by mouth 2 (two) times daily. , Disp: , Rfl:  .  PRESCRIPTION MEDICATION, Take 20 mg by mouth See admin instructions. Domperidone 10 mg from [place]: Take 2 tablets (20 mg) by mouth three times daily with meals - for diabetic gastroparesis, Disp: ,  Rfl:  .  prochlorperazine (COMPAZINE) 5 MG tablet, Take 1 tablet (5 mg total) by mouth every 8 (eight) hours as needed for nausea or vomiting. Have been taking it with meals--wean as able (Patient taking differently: Take 5 mg by mouth every 8 (eight) hours as needed for nausea or vomiting. ), Disp: 90 tablet, Rfl: 0 .  ranitidine (ZANTAC) 75 MG tablet, Take 75-150 mg by mouth daily. , Disp: , Rfl: 2 .  sertraline (ZOLOFT) 50 MG tablet, Take 1 tablet (50 mg total) by mouth daily., Disp: 30 tablet, Rfl: 11 .  tiZANidine (ZANAFLEX) 4 MG tablet, Take 4 mg by mouth 2 (two) times daily as needed., Disp: , Rfl: 5 .  topiramate (TOPAMAX) 50 MG tablet, Take 1 tablet (50 mg total) by mouth 2 (two) times daily., Disp: 60 tablet, Rfl: 0 .  traMADol (ULTRAM) 50 MG tablet, Take 50 mg by mouth 3 (three) times daily as needed for pain., Disp: , Rfl: 0 .  VICTOZA 18 MG/3ML SOPN, Inject 1.2 mg into the skin at bedtime. , Disp: , Rfl: 11 .  ACCU-CHEK COMPACT PLUS test strip, , Disp: , Rfl: 11 .  acetaZOLAMIDE (DIAMOX) 250 MG tablet, Take 1 tablet (250 mg total) by mouth 3 (three) times daily., Disp: 90 tablet, Rfl: 12   Exam: Current vital signs: BP (!) 162/74   Pulse 65   Temp 97.7 F (36.5 C) (Oral)   Resp 15   Ht 5\' 2"  (1.575 m)   Wt 59 kg   SpO2 98%   BMI 23.78 kg/m  Vital signs in last 24 hours: Temp:  [97.7 F (36.5 C)] 97.7 F (36.5 C) (09/25 1103) Pulse Rate:  [54-65] 65 (09/25 1800) Resp:  [10-16] 15 (09/25 1800) BP: (132-162)/(59-90) 162/74 (09/25 1800) SpO2:  [96 %-100 %] 98 % (09/25 1800) Weight:  [59 kg] 59 kg (09/25 1106)  GENERAL: Awake, appears uncomfortable.  HEENT: - Normocephalic and atraumatic Ext: warm, well perfused  NEURO:  Mental Status: Alert but unable to tell month, year, however does know state and able to name thumb. Perseverates on some words. Dysfluent; not using full sentences. Repetition impaired and not able to do 2 step commands.  Speech is dysarthric and  garbled.   Cranial Nerves: PERRL 4 to2 mm/brisk. EOMI, visual fields full, no facial asymmetry, facial sensation intact, hearing intact to voice. Hypophonic.  Motor: RUE weak with increased tone 4-/5 and bradykinetic.  LUE 5/5, no drift 5/5 left leg and 4/5 right leg  Decreased muscle mass of RUE and RLE Sensation- Intact to light touch bilaterally, but extinction on the right is noted Coordination: no ataxia in LUE. Unable to perform with RUE due to increased tone DTR: Left UE DTR brisk 3/4, 3/4 bilateral KJ, no AJ Gait- deferred   Labs  I have reviewed labs in epic and the results pertinent to this consultation are:   CBC    Component Value Date/Time   WBC 10.8 (H) 07/13/2018 1109   RBC 5.05 07/13/2018 1109   HGB 15.0 07/13/2018 1125   HCT 44.0 07/13/2018 1125   PLT 324 07/13/2018 1109   MCV 88.5 07/13/2018 1109   MCH 27.3 07/13/2018 1109   MCHC 30.9 07/13/2018 1109   RDW 14.6 07/13/2018 1109   LYMPHSABS 2.3 07/13/2018 1109   MONOABS 0.7 07/13/2018 1109   EOSABS 0.2 07/13/2018 1109   BASOSABS 0.1 07/13/2018 1109    CMP     Component Value Date/Time   NA 138 07/13/2018 1125   NA 137 01/15/2016 1632   K 3.5 07/13/2018 1125   CL 103 07/13/2018 1125   CO2 24 07/13/2018 1109   GLUCOSE 128 (H) 07/13/2018 1125   BUN 40 (H) 07/13/2018 1125   BUN 14 01/15/2016 1632   CREATININE 1.40 (H) 07/13/2018 1125   CALCIUM 9.9 07/13/2018 1109   PROT 7.2 07/13/2018 1109   ALBUMIN 4.0 07/13/2018 1109   AST 27 07/13/2018 1109   ALT 23 07/13/2018 1109   ALKPHOS 121 07/13/2018 1109   BILITOT 0.4 07/13/2018 1109   GFRNONAA 43 (L) 07/13/2018 1109   GFRAA 50 (L) 07/13/2018 1109    Lipid Panel     Component Value Date/Time   CHOL 297 (H) 06/02/2017 0741   TRIG 220 (H) 06/10/2017 0630   HDL 49 06/02/2017 0741   CHOLHDL 6.1 06/02/2017 0741   VLDL 37 06/02/2017 0741   LDLCALC 211 (H) 06/02/2017 0741     Imaging I have reviewed the images obtained:  CT scan of the  brain Chronic brain atrophy and white matter microvascular ischemic changes. Evolutionary changes of multifocal subcortical infarcts bilaterally, largest in the high left frontal lobe   MRI examination of the brain IMPRESSION: 1. Acute multifocal LEFT frontoparietal/MCA territory infarcts 2. Old LEFT greater than RIGHT MCA territory infarcts. Old basal ganglia and cerebellar infarcts. 3. Moderate chronic small vessel ischemic changes. 4. Mild parenchymal brain volume loss. 5. Acute findings discussed with and reconfirmed by Dr.ELLIOTT WENTZ on 07/13/2018 at 5:24 pm.  Etta Quill PA-C [facility] [phone]  M-F  (9:00 am- 5:00 PM)  07/13/2018, 6:50 PM     Assessment: 67 year old female presenting with generalized weakness and MRI that shows acute multifocal LEFT frontoparietal/MCA territory infarcts, which are relatively small in size 1. Prior history of stroke. Chronic sequelae of this are seen on her MRI from today.  2. An EEG from August of 2018 showed slowing, but no seizures. 3. A carotid angiogram in August of 2018 revealed the following:   Patent type I aortic arch  Widely patent great vessels  Patent carotid artery with minimal disease  Patent L internal carotid artery with 43% stenosis by NASCET criteria proximally, disease extends minimally to angle of mandible, may be higher (on anterior-posterior projection)  Patent left external carotid artery with minimal disease 4. An echocardiogram from August of 2018 showed EF of 60-65% with no mural thrombus documented 5. Pseudotumor cerebri, diagnosed in 1999. Has lumboperitoneal shunt. Last shunt revision in 2007. 6. 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 Not currently on an anticonvulsant.    Recommendations: #MRA Head   #TEE  #Continue Plavix for now. May need escalation  #Unable to tolerate statins per husband # BP goal: permissive HTN upto 220/120 mmHg # HBAIC and Lipid  profile # Telemetry monitoring # Frequent neuro checks # NPO until passes stroke swallow screen # Husband requests that blood draws be limited due to patient often becoming uncooperative after repeat blood draws. He also requests a PICC line.  # please page stroke NP  Or  PA  Or MD from 8am -4 pm  as this patient from this time will be  followed by the stroke.   You can look them up on [email or web address]  Password [email or web address]  I have seen and examined the patient. I have amended the exam, assessment and recommendations above.  Electronically signed: Dr. Kerney Elbe

## 2018-07-13 NOTE — ED Notes (Signed)
Pt's family member informed that she needs to provide a UA when possible. Pt unable to verbalize understanding but family at bedside did.

## 2018-07-13 NOTE — ED Triage Notes (Signed)
On Thursday she had n/v, continued over the weekend. Sunday she was better. Last night went to bed at 2 am was fine. This morning at [place] her daughter tried to talk with her and she wouldn't open her eyes or talk. Has right sided deficits from prior CVA. Pt is drowsy, is moaning. Difficult to assess speech.

## 2018-07-13 NOTE — ED Notes (Signed)
Pt put on Purewick at 17:15.

## 2018-07-13 NOTE — ED Provider Notes (Signed)
MSE was initiated and I personally evaluated the patient and placed orders (if any) at  1135 on July 13, 2018.  History of stroke with right-sided deficits who presents the emergency department today with altered mental status and confusion.  Some of the patient was last seen well around 2:00 last night but this morning it sounds like the family thinks that she is not speaking normally and she has generalized weakness is abnormal for her.  I was asked to evaluate the patient and triaged to decide whether or not a code stroke based on Lucianne Lei status could be called.  On my evaluation patient had equal pupils, normal visual fields, significant weakness on the right, was able to tell me where she was, who she was and that she did not feel well.  I do not think she meets any other criteria for a code stroke based on VAN criteria and is able to get a work-up and wait for the next provider but does need to be seen urgently.  The patient appears stable so that the remainder of the MSE may be completed by another provider.    Merrily Pew, MD 07/13/18 [phone]

## 2018-07-13 NOTE — H&P (Signed)
[name]:[id number] DOB: [birth year] DOA: 07/13/2018     PCP: [name], MD   Outpatient Specialists:      NEurology    Dr. Jaynee Eagles      GI  Dr.  Sadie Haber,  ) : [name]    Patient arrived to ER on 07/13/18 at 1049  Patient coming from: home Lives  With family    Chief Complaint: Difficult to understand speech HPI: [name] is a 67 y.o. female with medical history significant of DM 2, CVA, anemia, GERD, HLD, HTN seizures disorder.Pseudotumor cerebri, chronic refractory headaches, debility, sp PEG and Trach now removed    Presented with diminished responsiveness was found this morning in her bed.  This started on Thursday when she started to have nausea vomiting proceeded to be sick over the weekend but by Sunday she was doing a little bit better on Tuesday night she went to bed and the last time was seen at 2 AM at baseline.  At 7 :15 a.m. this morning husband could not wake her up she would not open her eyes to talk to him when she did she was doing it in the very quiet voice and was moaning and drowsy. Family felt that she was not speaking normally and had generalized fatigue which was not her baseline they were concerned and to go to primary care provider who sent her into emergency department. Otherwise no chest pain no other complaints no fevers or chills patient unable to provide her own history and details. At her baseline patient requires assistance secondary to prior strokes she sometimes uses a wheelchair. Patient has had frequent falls last one was 10 days ago when she fell off of commode   Regarding pertinent Chronic problems: History of prior strokes with right-sided deficits at baseline. Review of records shows that patient was sent to the emergency room mid-August for right hand grip strength weakness. MRI showed acute and subacute infarcts in the left hemisphere in the left MCA distribution raising possibility of embolic source. CTA of  the head and neck showed a large amount of noncalcified plaque in the proximal left internal carotid artery causing 60-65% stenosis and this was diagnosed as the likely etiology of emboli. Vascular was consulted for possible left carotid endarterectomy. A cerebral angiogram was ordered. During the procedure, patient had a sudden decline in mental status, became unresponsive, had roving eye movements, dilated and reactive pupils, bilateral upper extremity tonic posturing, diaphoresis taken immediately for stat CT of the head and CT head and neck. There were multiple thrombi in the bilateral M1and proximal M2segments bilaterally and emergent mechanical thrombectomy was performed. Patient status post complete revascularization of bilateral M1 segments with reperfusion bilaterally. EEG showed mild generalized cerebral dysfunction, intermittent arm twitching was not associated with any epileptiform activity. Repeat MRI showed numerous acute new supra-and infratentorial infarct spanning multiple vascular territories consistent with embolic phenomena She was trached, and was sent to inpatient rehabilitation at the end of August. She had intensive physical therapy, speech therapy and occupational therapy. Topamax was added to help manage persistent headache and titrated upwards without side effects. Her blood pressure was managed. She did require PEG placement.  Tracheostomy and PEG have been removed  Carotid Dopplers from [employer] 2019 Right 1-39% Left 40--59%  History of diabetes on Lantus and metformin  While in ER: Initial CT head unremarkable MRI confirmed new CVA The following Work up has been ordered so far:  Orders Placed This Encounter  Procedures  . Critical Care  . CT HEAD WO CONTRAST  . DG Chest 2 View  . MR BRAIN WO CONTRAST  . Protime-INR  . APTT  . CBC  . Differential  . Comprehensive metabolic panel  . Urinalysis, Routine w reflex microscopic  . Diet NPO time specified  . Cardiac  monitoring  . Stroke swallow screen  . NIH Stroke Scale  . Modified Stroke Scale (mNIHSS) Document mNIHSS assessment every 2 hours for a total of 12 hours  . Saline Lock IV, Maintain IV access  . If O2 Sat <94% administer O2 at 2 liters/minute via nasal cannula  . Fluid/PO Challenge  . Patient may eat/drink  . Catherize if unable to void  . Consult to hospitalist  . Consult to Neuro Hospitalist  . Pulse oximetry, continuous  . SLP eval and treat Reason for evaluation: .Swallowing evaluation (BSE, MBS and/or diet order as indicated)  . I-stat troponin, ED  . CBG monitoring, ED  . I-Stat Chem 8, ED  . EKG 12-Lead  . EKG 12-Lead  . ED EKG     Following Medications were ordered in ER: Medications  0.9 %  sodium chloride infusion ( Intravenous Stopped 07/13/18 1815)  dextrose 5 %-0.45 % sodium chloride infusion (has no administration in time range)  sodium chloride 0.9 % bolus 500 mL (0 mLs Intravenous Stopped 07/13/18 1519)    Significant initial  Findings: Abnormal Labs Reviewed  APTT - Abnormal; Notable for the following components:      Result Value   aPTT 22 (*)    All other components within normal limits  CBC - Abnormal; Notable for the following components:   WBC 10.8 (*)    All other components within normal limits  COMPREHENSIVE METABOLIC PANEL - Abnormal; Notable for the following components:   Potassium 3.2 (*)    Glucose, Bld 130 (*)    BUN 38 (*)    Creatinine, Ser 1.27 (*)    GFR calc non Af Amer 43 (*)    GFR calc Af Amer 50 (*)    All other components within normal limits  I-STAT CHEM 8, ED - Abnormal; Notable for the following components:   BUN 40 (*)    Creatinine, Ser 1.40 (*)    Glucose, Bld 128 (*)    All other components within normal limits     Na 138 K 3.5  Cr  Up from baseline see below Lab Results  Component Value Date   CREATININE 1.40 (H) 07/13/2018   CREATININE 1.27 (H) 07/13/2018   CREATININE 1.21 (H) 07/06/2017      WBC   10.8  HG/HCT  stable,       Component Value Date/Time   HGB 15.0 07/13/2018 1125   HCT 44.0 07/13/2018 1125      Troponin (Point of Care Test) Recent Labs    07/13/18 1123  TROPIPOC 0.01       UA   ordered   CT HEAD   NON acute  CXR -   NON acute     MRI: Acute multifocal LEFT frontoparietal/MCA territory infarcts  ECG:  Personally reviewed by me showing: HR : 61 Rhythm:  NSR,   , no evidence of ischemic changes QTC 424      ED Triage Vitals  Enc Vitals Group     BP 07/13/18 1103 (!) 150/83     Pulse Rate 07/13/18 1103 (!) 59     Resp 07/13/18 1103 14  Temp 07/13/18 1103 97.7 F (36.5 C)     Temp Source 07/13/18 1103 Oral     SpO2 07/13/18 1103 100 %     Weight 07/13/18 1106 130 lb (59 kg)     Height 07/13/18 1106 5\' 2"  (1.575 m)     Head Circumference --      Peak Flow --      Pain Score --      Pain Loc --      Pain Edu? --      Excl. in [place]? --   TMAX(24)@       Latest  Blood pressure (!) 162/74, pulse 65, temperature 97.7 F (36.5 C), temperature source Oral, resp. rate 15, height 5\' 2"  (1.575 m), weight 59 kg, SpO2 98 %.    ER Provider Called:   Neurology   Dr. Tamala Julian They Recommend CVA work up  Will see I   in ER  Hospitalist was called for admission for new cVA   Review of Systems:    Pertinent positives include: fatigue,  Constitutional:  No weight loss, night sweats, Fevers, chills,  weight loss  HEENT:  No headaches, Difficulty swallowing,Tooth/dental problems,Sore throat,  No sneezing, itching, ear ache, nasal congestion, post nasal drip,  Cardio-vascular:  No chest pain, Orthopnea, PND, anasarca, dizziness, palpitations.no Bilateral lower extremity swelling  GI:  No heartburn, indigestion, abdominal pain, nausea, vomiting, diarrhea, change in bowel habits, loss of appetite, melena, blood in stool, hematemesis Resp:  no shortness of breath at rest. No dyspnea on exertion, No excess mucus, no productive cough, No  non-productive cough, No coughing up of blood.No change in color of mucus.No wheezing. Skin:  no rash or lesions. No jaundice GU:  no dysuria, change in color of urine, no urgency or frequency. No straining to urinate.  No flank pain.  Musculoskeletal:  No joint pain or no joint swelling. No decreased range of motion. No back pain.  Psych:  No change in mood or affect. No depression or anxiety. No memory loss.  Neuro: no localizing neurological complaints, no tingling, no weakness, no double vision, no gait abnormality, no slurred speech, no confusion  All systems reviewed and apart from [place] all are negative  Past Medical History:   Past Medical History:  Diagnosis Date  . Anemia    takes iron supplement  . Anesthesia complication    disorientation due to pseudotumor  . Diabetes mellitus    IDDM  . Family history of anesthesia complication 45 yrs ago   brother stopped breathing for a minute or two  . GERD (gastroesophageal reflux disease)   . Headache(784.0)    due to pseudotumor; daily headache  . Hypercholesteremia    unable to tolerate statins  . Hypertension    under control with meds., has been on med. x 5 yr.  . Peripheral neuropathy   . PONV (postoperative nausea and vomiting)   . 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 Seizures ([place])    due to shunt failure; last seizure 2007  . Synovitis of ankle 04/2013   left  . Wears dentures    full      Past Surgical History:  Procedure Laterality Date  . ANKLE ARTHROSCOPY Left 05/18/2013   Procedure: LEFT ANKLE ARTHROSCOPY WITH DEBRIDEMENT,  SUBTALAR OPEN DEBRIDEMENT ;  Surgeon: Wylene Simmer, MD;  Location: [facility];  Service: Orthopedics;  Laterality: Left;  . APPENDECTOMY     as a child  .  BALLOON DILATION N/A 07/04/2013   Procedure: BALLOON DILATION;  Surgeon: Morgan Fair, MD;  Location: [facility];  Service: Endoscopy;  Laterality: N/A;  . BLADDER SUSPENSION    . BREAST  LUMPECTOMY W/ NEEDLE LOCALIZATION Left 05/22/2011  . CAROTID ANGIOGRAPHY Bilateral 06/03/2017   Procedure: Bilateral Carotid Angiography;  Surgeon: Conrad Torrance, MD;  Location: [facility];  Service: Cardiovascular;  Laterality: Bilateral;  . COLONOSCOPY WITH PROPOFOL N/A 05/14/2014   Procedure: COLONOSCOPY WITH PROPOFOL;  Surgeon: Morgan Fair, MD;  Location: [facility];  Service: Endoscopy;  Laterality: N/A;  . ESOPHAGEAL DILATION  06/23/2006; 08/05/2004  . ESOPHAGOGASTRODUODENOSCOPY N/A 07/04/2013   Procedure: ESOPHAGOGASTRODUODENOSCOPY (EGD);  Surgeon: Morgan Fair, MD;  Location: [facility];  Service: Endoscopy;  Laterality: N/A;  . ESOPHAGOGASTRODUODENOSCOPY (EGD) WITH PROPOFOL N/A 05/14/2014   Procedure: ESOPHAGOGASTRODUODENOSCOPY (EGD) WITH PROPOFOL;  Surgeon: Morgan Fair, MD;  Location: [facility];  Service: Endoscopy;  Laterality: N/A;  . IR ANGIO VERTEBRAL SEL SUBCLAVIAN INNOMINATE UNI R MOD SED  06/03/2017  . IR GASTROSTOMY TUBE MOD SED  06/25/2017  . IR GASTROSTOMY TUBE REMOVAL  08/20/2017  . IR PERCUTANEOUS ART THROMBECTOMY/INFUSION INTRACRANIAL INC DIAG ANGIO  06/03/2017  . IR PERCUTANEOUS ART THROMBECTOMY/INFUSION INTRACRANIAL INC DIAG ANGIO  06/03/2017  . IR RADIOLOGIST EVAL & MGMT  09/07/2017  . LUMBAR PERITONEAL SHUNT     x 2  . OVARIAN CYST REMOVAL     age 60  . RADIOLOGY WITH ANESTHESIA N/A 06/03/2017   Procedure: RADIOLOGY WITH ANESTHESIA;  Surgeon: Luanne Bras, MD;  Location: [place];  Service: Radiology;  Laterality: N/A;  . SHUNT REVISION  2007  . TOTAL ABDOMINAL HYSTERECTOMY W/ BILATERAL SALPINGOOPHORECTOMY  1994   age 55s  . WRIST SURGERY Left 2014    [name]    Social History:  Ambulatory  independently  Or with assistance      reports that she has never smoked. She has never used smokeless tobacco. She reports that she drinks alcohol. She reports that she does not use drugs.     Family History:   Family History  Problem Relation  Age of Onset  . Heart disease Father   . Migraines Neg Hx     Allergies: Allergies  Allergen Reactions  . Carbamazepine Hives, Shortness Of Breath and Other (See Comments)  . Penicillins Anaphylaxis, Other (See Comments) and Swelling    Mother, father and brother have history of anaphylaxis reaction to penicillin so pt does not take  My throat swells Familial history of anaphylaxis   . Statins Other (See Comments)    Severe muscle weakness, leg numbness, severe headaches, chest pain   . Tricyclic Antidepressants Other (See Comments)    IMPAIRED MEMORY  . Atorvastatin Other (See Comments)    Severe muscle weakness, leg numbness, severe headaches, chest pain   . Ezetimibe Other (See Comments)    Severe muscle weakness, leg numbness, severe headaches, chest pain   . Nortriptyline Other (See Comments)    IMPAIRED MEMORY  . Quinapril Hcl Other (See Comments)    Unknown reaction  . Rifampin Diarrhea  . Metoclopramide Nausea And Vomiting and Rash     Prior to Admission medications   Medication Sig Start Date End Date Taking? Authorizing Provider  busPIRone (BUSPAR) 5 MG tablet Take 5 mg by mouth 3 (three) times daily. 03/12/18  Yes [provider]  CARTIA XT 180 MG 24 hr capsule Take 180 mg by mouth daily. 05/13/18  Yes [provider]  cloNIDine (CATAPRES) 0.1 MG tablet Take 0.1 mg by mouth 2 (two) times daily. 03/31/18  Yes [provider]  clopidogrel (PLAVIX) 75 MG tablet Take 1 tablet (75 mg total) by mouth daily with breakfast. 07/09/17  Yes Love, Ivan Anchors, PA-C  diclofenac sodium (VOLTAREN) 1 % GEL Apply 2 application topically 4 (four) times daily as needed (pain).    Yes [provider]  esomeprazole (NEXIUM) 40 MG capsule Take 40 mg by mouth daily as needed (acid reflux). Acid reflux   Yes [provider]  insulin glargine (LANTUS) 100 unit/mL SOPN Inject 0.2 mLs (20 Units total) into the skin at bedtime. Patient taking  differently: Inject 22 Units into the skin at bedtime.  07/08/17  Yes Love, Ivan Anchors, PA-C  metFORMIN (GLUMETZA) 500 MG (MOD) 24 hr tablet Take 500 mg by mouth daily.    Yes [provider]  metoprolol succinate (TOPROL-XL) 100 MG 24 hr tablet Take 100 mg by mouth 2 (two) times daily.    Yes [provider]  PRESCRIPTION MEDICATION Take 20 mg by mouth See admin instructions. Domperidone 10 mg from [place]: Take 2 tablets (20 mg) by mouth three times daily with meals - for diabetic gastroparesis   Yes [provider]  prochlorperazine (COMPAZINE) 5 MG tablet Take 1 tablet (5 mg total) by mouth every 8 (eight) hours as needed for nausea or vomiting. Have been taking it with meals--wean as able Patient taking differently: Take 5 mg by mouth every 8 (eight) hours as needed for nausea or vomiting.  07/08/17  Yes Love, Ivan Anchors, PA-C  ranitidine (ZANTAC) 75 MG tablet Take 75-150 mg by mouth daily.  03/07/18  Yes [provider]  sertraline (ZOLOFT) 50 MG tablet Take 1 tablet (50 mg total) by mouth daily. 05/18/18  Yes Melvenia Beam, MD  tiZANidine (ZANAFLEX) 4 MG tablet Take 4 mg by mouth 2 (two) times daily as needed. 06/30/18  Yes [provider]  topiramate (TOPAMAX) 50 MG tablet Take 1 tablet (50 mg total) by mouth 2 (two) times daily. 07/08/17  Yes Love, Ivan Anchors, PA-C  traMADol (ULTRAM) 50 MG tablet Take 50 mg by mouth 3 (three) times daily as needed for pain. 07/05/18  Yes [provider]  VICTOZA 18 MG/3ML SOPN Inject 1.2 mg into the skin at bedtime.  05/02/18  Yes [provider]  ACCU-CHEK COMPACT PLUS test strip  01/18/18   [provider]  acetaZOLAMIDE (DIAMOX) 250 MG tablet Take 1 tablet (250 mg total) by mouth 3 (three) times daily. 07/16/17   Melvenia Beam, MD   Physical Exam: Blood pressure (!) 162/74, pulse 65, temperature 97.7 F (36.5 C), temperature source Oral, resp. rate 15, height 5\' 2"  (1.575 m), weight 59 kg, SpO2  98 %. 1. General:  in No Acute distress   Chronically ill  -appearing 2. Psychological: Alert and   Oriented 3. Head/ENT:   Dry Mucous Membranes                          Head Non traumatic, neck supple                           Poor Dentition 4. SKIN:  decreased Skin turgor,  Skin clean Dry and intact no rash 5. Heart: Regular rate and rhythm no  Murmur, no Rub or gallop 6. Lungs:   no wheezes  or crackles   7. Abdomen: Soft,  non-tender, Non distended bowel sounds present 8. Lower extremities: no clubbing, cyanosis, or  edema 9. Neurologically strength 3 out of 5 on the right extremities,  cranial nerves II through XII intact, right facial droop  10. MSK: Normal range of motion   LABS:     Recent Labs  Lab 07/13/18 1109 07/13/18 1125  WBC 10.8*  --   NEUTROABS 7.5  --   HGB 13.8 15.0  HCT 44.7 44.0  MCV 88.5  --   PLT 324  --    Basic Metabolic Panel: Recent Labs  Lab 07/13/18 1109 07/13/18 1125  NA 138 138  K 3.2* 3.5  CL 100 103  CO2 24  --   GLUCOSE 130* 128*  BUN 38* 40*  CREATININE 1.27* 1.40*  CALCIUM 9.9  --       Recent Labs  Lab 07/13/18 1109  AST 27  ALT 23  ALKPHOS 121  BILITOT 0.4  PROT 7.2  ALBUMIN 4.0   No results for input(s): LIPASE, AMYLASE in the last 168 hours. No results for input(s): AMMONIA in the last 168 hours.    HbA1C: No results for input(s): HGBA1C in the last 72 hours. CBG: No results for input(s): GLUCAP in the last 168 hours.    Urine analysis:    Component Value Date/Time   COLORURINE YELLOW 06/20/2017 0642   APPEARANCEUR HAZY (A) 06/20/2017 0642   LABSPEC 1.010 06/20/2017 0642   PHURINE 6.0 06/20/2017 0642   GLUCOSEU 50 (A) 06/20/2017 0642   HGBUR LARGE (A) 06/20/2017 0642   BILIRUBINUR NEGATIVE 06/20/2017 0642   KETONESUR NEGATIVE 06/20/2017 0642   PROTEINUR NEGATIVE 06/20/2017 0642   NITRITE POSITIVE (A) 06/20/2017 0642   LEUKOCYTESUR LARGE (A) 06/20/2017 0642       Cultures:    Component Value  Date/Time   SDES URINE, RANDOM 06/21/2017 1737   SPECREQUEST NONE 06/21/2017 1737   CULT MULTIPLE SPECIES PRESENT, SUGGEST RECOLLECTION (A) 06/21/2017 1737   REPTSTATUS 06/22/2017 FINAL 06/21/2017 1737     Radiological Exams on Admission: Dg Chest 2 View  Result Date: 07/13/2018 CLINICAL DATA:  Generalized weakness.  Hypertension. EXAM: CHEST - 2 VIEW COMPARISON:  June 23, 2017 FINDINGS: There is mild scarring in the left base with questionable small left pleural effusion. Lungs elsewhere clear. Heart is mildly enlarged with pulmonary vascularity normal. No adenopathy. There is aortic atherosclerosis. No evident bone lesions. IMPRESSION: Mild scarring left base with questionable small left pleural effusion. Lungs elsewhere clear. Mild cardiac prominence. There is aortic atherosclerosis. Aortic Atherosclerosis (ICD10-I70.0). Electronically Signed   By: Lowella Grip III M.D.   On: 07/13/2018 13:13   Ct Head Wo Contrast  Result Date: 07/13/2018 CLINICAL DATA:  Altered mental status EXAM: CT HEAD WITHOUT CONTRAST TECHNIQUE: Contiguous axial images were obtained from the base of the skull through the vertex without intravenous contrast. COMPARISON:  06/23/2017, 06/04/2017 FINDINGS: Brain: Brain atrophy noted. Chronic white matter microvascular changes. Progressive subcortical white matter scattered hypodensities throughout both cerebral hemispheres, worse in the left frontal lobe compatible with evolutionary changes of previous multifocal infarcts from 06/04/2017. No acute intracranial hemorrhage, mass lesion, definite new infarction, midline shift, herniation, hydrocephalus, or extra-axial fluid collection. No focal mass effect or edema. Cisterns are patent. Cerebellar atrophy as well. Vascular: Intracranial atherosclerosis.  No hyperdense vessel. Skull: Normal. Negative for fracture or focal lesion. Sinuses/Orbits: No acute finding. Other: None. IMPRESSION: Chronic brain atrophy and white matter  microvascular ischemic changes.  Evolutionary changes of multifocal subcortical infarcts bilaterally, largest in the high left frontal lobe when compared 06/04/2017 MRI. No acute intracranial hemorrhage. Electronically Signed   By: Jerilynn Mages.  Shick M.D.   On: 07/13/2018 13:07   Mr Brain Wo Contrast  Result Date: 07/13/2018 CLINICAL DATA:  Altered mental status, somnolent. History of stroke and RIGHT-sided weakness. History of seizures, pseudotumor cerebral with lumboperitoneal shunt, hypertension, hypercholesterolemia, diabetes. EXAM: MRI HEAD WITHOUT CONTRAST TECHNIQUE: Multiplanar, multiecho pulse sequences of the brain and surrounding structures were obtained without intravenous contrast. COMPARISON:  CT HEAD July 13, 2018 and MRI head June 04, 2017. FINDINGS: INTRACRANIAL CONTENTS: Multifocal reduced diffusion LEFT frontoparietal lobes with low ADC values. LEFT frontoparietal encephalomalacia with mild hemosiderin staining and chronic microhemorrhages. Old bilateral basal ganglia infarcts. Multifocal small areas RIGHT frontal encephalomalacia. Old small LEFT cerebellar infarct. Patchy supratentorial and pontine white matter FLAIR T2 hyperintensities. No midline shift, mass effect or masses. Mild parenchymal brain volume loss. No hydrocephalus. No abnormal extra-axial fluid collections. VASCULAR: Normal major intracranial vascular flow voids present at skull base. SKULL AND UPPER CERVICAL SPINE: No abnormal sellar expansion. No suspicious calvarial bone marrow signal. Craniocervical junction maintained. SINUSES/ORBITS: The mastoid air-cells and included paranasal sinuses are well-aerated.The included ocular globes and orbital contents are non-suspicious. Status post bilateral ocular lens implants. OTHER: Patient is edentulous. IMPRESSION: 1. Acute multifocal LEFT frontoparietal/MCA territory infarcts 2. Old LEFT greater than RIGHT MCA territory infarcts. Old basal ganglia and cerebellar infarcts. 3. Moderate  chronic small vessel ischemic changes. 4. Mild parenchymal brain volume loss. 5. Acute findings discussed with and reconfirmed by Dr.ELLIOTT WENTZ on 07/13/2018 at 5:24 pm. Electronically Signed   By: Elon Alas M.D.   On: 07/13/2018 17:26    Chart has been reviewed    Assessment/Plan  67 y.o. female with medical history significant of DM 2, CVA, anemia, GERD, HLD, HTN seizures disorder.Pseudotumor cerebri, chronic refractory headaches, debility, sp PEG and Trach now removed Admitted for CVA  Present on Admission:   . Acute CVA (cerebrovascular accident) ([place]) -  - will admit based on TIA/CVA protocol, Initial Stroke scale 3       Monitor on Tele       MRI Resulted - showing acute ischemic CVA        Will order MRA head and neck       Carotid Doppler         Echo to evaluate for possible embolic source,        obtain cardiac enzymes,  ECG,   Lipid panel, TSH.        Order PT/OT evaluation.        Keep nothing by mouth will need Speech pathology evaluation      Restart  Plavix   when able to take PO       She is allergic to statins       Allow permissive Hypertension keep BP <220/120        Neurology consulted Have seen pt in ER   . Benign essential HTN allow permissive hypertension . Hypokalemia - - will replace and repeat in AM,  check magnesium level and replace as needed  . Type II diabetes mellitus with complication (HCC) -  - Order Sensitive  SSI   - decrease home insulin regimen   To  Lantus 15units,  -  check TSH and HgA1C  - Hold by mouth medications  History of headaches restart Topamax when able family denies history of true seizure disorder  Other plan as per orders.  DVT prophylaxis:  SCD     Code Status:  FULL CODE  as per patient  I had personally discussed CODE STATUS with patient and family   Family Communication:   Family  at  Bedside  plan of care was discussed with  Husband,    Disposition Plan:     likely will need placement for  rehabilitation                                            Would benefit from PT/OT eval prior to DC  Ordered                   Swallow eval - SLP ordered                   Social Work  consulted                   Nutrition    consulted                                     Consults called: Neurology   Admission status   inpatient     Expect 2 midnight stay secondary to severity of patient's current illness     and extensive comorbidities including:    DM2    history of stroke with residual deficits    That are currently affecting medical management.  I expect  patient to be hospitalized for 2 midnights requiring inpatient medical care.  Patient is at high risk for adverse outcome (such as loss of life or disability) if not treated.  Indication for inpatient stay as follows:      inability to maintain oral hydration    Need foR IV fluids      Level of care   tele   24H             Keyshon Stein 07/13/2018, 7:49 PM    [facility]  Pager [phone]   after 2 AM please page floor coverage PA If 7AM-7PM, please contact the day team taking care of the patient  [email or web address]  Password [email or web address]

## 2018-07-13 NOTE — ED Notes (Signed)
Patient transported to X-ray 

## 2018-07-13 NOTE — ED Provider Notes (Signed)
[facility] EMERGENCY DEPARTMENT Provider Note   CSN: [id number] Arrival date & time: 07/13/18  1049     History   Chief Complaint No chief complaint on file.   HPI [name] is a 67 y.o. female.  HPI   Patient presents for being unresponsive since 8 AM when she was found in her bed, after awakening.  Her husband describes asking her if she wanted to go to the hospital at that point and she stated no, but did not talk much beyond that.  He was able to get her into his car, she assisted by going from wheelchair to car, and standing at some point.  He decided to take her to an urgent care, where she was briefly seen and then sent here for further evaluation.  He has become more talkative during that transition.  The patient is able to respond to me and states that she "does not feel right."  She denies pain anywhere.  She is unable to give specific history.  Her husband states that she typically requires assistance to get around the house either standing near her, or helping her with a wheelchair.  She does not use a cane or walker.  He saw her PCP about 3 weeks ago for a checkup.  She had a fall about 10 days ago from the commode but did not need to see the doctor for "a foot contusion," which occurred after the fall.  Last weekend she had some vomiting that was suspected to be from food poisoning, after eating old [place] food.  She did not have diarrhea at that time.  There have been no fever, chills, complaint of chest pain or back pain.  The patient's husband describes her having had a stroke previously, diagnosis was delayed because it took a long time to get an MRI.  There are no other known modifying factors.  Past Medical History:  Diagnosis Date  . Anemia    takes iron supplement  . Anesthesia complication    disorientation due to pseudotumor  . Diabetes mellitus    IDDM  . Family history of anesthesia complication 59 yrs ago   brother stopped breathing  for a minute or two  . GERD (gastroesophageal reflux disease)   . Headache(784.0)    due to pseudotumor; daily headache  . Hypercholesteremia    unable to tolerate statins  . Hypertension    under control with meds., has been on med. x 5 yr.  . Peripheral neuropathy   . PONV (postoperative nausea and vomiting)   . 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 Seizures ([place])    due to shunt failure; last seizure 2007  . Synovitis of ankle 04/2013   left  . Wears dentures    full    Patient Active Problem List   Diagnosis Date Noted  . Adjustment disorder with depressed mood 11/02/2017  . Adjustment disorder with mixed anxiety and depressed mood   . Hemoptysis   . Hematemesis   . Intractable vomiting with nausea   . Acute lower UTI   . Urinary retention   . Diabetic gastroparesis ([place])   . Vascular headache   . Diabetes mellitus ([place])   . CVA (cerebral vascular accident) ([place]) 06/18/2017  . Weakness   . 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 Right hemiparesis ([place])   . DNR (do not resuscitate) discussion 06/16/2017  . Palliative care by  specialist 06/16/2017  . Anxiety state   . Acute embolic stroke ([place])   . Benign essential HTN   . Diabetic peripheral neuropathy ([place])   . Pseudotumor cerebri   . Hypokalemia   . Seizure ([place])   . Leukocytosis   . Acute blood loss anemia   . Post-operative pain   . Encounter for feeding tube placement   . Respiratory failure ([employer])   . Status post tracheostomy ([place])   . Acute respiratory failure with hypoxemia ([place])   . Stenosis of left carotid artery   . Acute CVA (cerebrovascular accident) ([place]) 05/31/2017  . Type II diabetes mellitus with complication ([place]) 81/10/7508  . Hypertension 05/31/2017  . Hyponatremia 05/31/2017  . IIH (idiopathic intracranial hypertension) 01/18/2016  . Malfunction of ventriculo-peritoneal shunt ([place]) 01/18/2016  . 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 Worsening  headaches 01/18/2016  . Increased abdominal girth 01/18/2016  . Abdominal fluid collection 01/18/2016  . Vision changes 01/18/2016  . Perceived hearing changes 01/18/2016  . Nausea without vomiting 01/18/2016  . Peripheral vision loss 01/18/2016  . Imbalance 01/18/2016  . Falls 01/18/2016  . Dysphagia, oropharyngeal 01/18/2016  . Subacute confusional state 01/18/2016  . Left breast mass 05/05/2011    Past Surgical History:  Procedure Laterality Date  . ANKLE ARTHROSCOPY Left 05/18/2013   Procedure: LEFT ANKLE ARTHROSCOPY WITH DEBRIDEMENT,  SUBTALAR OPEN DEBRIDEMENT ;  Surgeon: Wylene Simmer, MD;  Location: [facility];  Service: Orthopedics;  Laterality: Left;  . APPENDECTOMY     as a child  . BALLOON DILATION N/A 07/04/2013   Procedure: BALLOON DILATION;  Surgeon: Garlan Fair, MD;  Location: [facility];  Service: Endoscopy;  Laterality: N/A;  . BLADDER SUSPENSION    . BREAST LUMPECTOMY W/ NEEDLE LOCALIZATION Left 05/22/2011  . CAROTID ANGIOGRAPHY Bilateral 06/03/2017   Procedure: Bilateral Carotid Angiography;  Surgeon: Conrad Bellefonte, MD;  Location: [facility];  Service: Cardiovascular;  Laterality: Bilateral;  . COLONOSCOPY WITH PROPOFOL N/A 05/14/2014   Procedure: COLONOSCOPY WITH PROPOFOL;  Surgeon: Garlan Fair, MD;  Location: [facility];  Service: Endoscopy;  Laterality: N/A;  . ESOPHAGEAL DILATION  06/23/2006; 08/05/2004  . ESOPHAGOGASTRODUODENOSCOPY N/A 07/04/2013   Procedure: ESOPHAGOGASTRODUODENOSCOPY (EGD);  Surgeon: Garlan Fair, MD;  Location: [facility];  Service: Endoscopy;  Laterality: N/A;  . ESOPHAGOGASTRODUODENOSCOPY (EGD) WITH PROPOFOL N/A 05/14/2014   Procedure: ESOPHAGOGASTRODUODENOSCOPY (EGD) WITH PROPOFOL;  Surgeon: Garlan Fair, MD;  Location: [facility];  Service: Endoscopy;  Laterality: N/A;  . IR ANGIO VERTEBRAL SEL SUBCLAVIAN INNOMINATE UNI R MOD SED  06/03/2017  . IR GASTROSTOMY TUBE MOD SED  06/25/2017  . IR GASTROSTOMY  TUBE REMOVAL  08/20/2017  . IR PERCUTANEOUS ART THROMBECTOMY/INFUSION INTRACRANIAL INC DIAG ANGIO  06/03/2017  . IR PERCUTANEOUS ART THROMBECTOMY/INFUSION INTRACRANIAL INC DIAG ANGIO  06/03/2017  . IR RADIOLOGIST EVAL & MGMT  09/07/2017  . LUMBAR PERITONEAL SHUNT     x 2  . OVARIAN CYST REMOVAL     age 8  . RADIOLOGY WITH ANESTHESIA N/A 06/03/2017   Procedure: RADIOLOGY WITH ANESTHESIA;  Surgeon: Luanne Bras, MD;  Location: [facility];  Service: Radiology;  Laterality: N/A;  . SHUNT REVISION  2007  . TOTAL ABDOMINAL HYSTERECTOMY W/ BILATERAL SALPINGOOPHORECTOMY  1994   age 43s  . WRIST SURGERY Left 2014    [name]     OB History   None      Home Medications    Prior to Admission medications   Medication  Sig Start Date End Date Taking? Authorizing Provider  busPIRone (BUSPAR) 5 MG tablet Take 5 mg by mouth 3 (three) times daily. 03/12/18  Yes [provider]  CARTIA XT 180 MG 24 hr capsule Take 180 mg by mouth daily. 05/13/18  Yes [provider]  cloNIDine (CATAPRES) 0.1 MG tablet Take 0.1 mg by mouth 2 (two) times daily. 03/31/18  Yes [provider]  clopidogrel (PLAVIX) 75 MG tablet Take 1 tablet (75 mg total) by mouth daily with breakfast. 07/09/17  Yes Love, Ivan Anchors, PA-C  diclofenac sodium (VOLTAREN) 1 % GEL Apply 2 application topically 4 (four) times daily as needed (pain).    Yes [provider]  esomeprazole (NEXIUM) 40 MG capsule Take 40 mg by mouth daily as needed (acid reflux). Acid reflux   Yes [provider]  insulin glargine (LANTUS) 100 unit/mL SOPN Inject 0.2 mLs (20 Units total) into the skin at bedtime. Patient taking differently: Inject 22 Units into the skin at bedtime.  07/08/17  Yes Love, Ivan Anchors, PA-C  metFORMIN (GLUMETZA) 500 MG (MOD) 24 hr tablet Take 500 mg by mouth daily.    Yes [provider]  metoprolol succinate (TOPROL-XL) 100 MG 24 hr tablet Take 100 mg by mouth 2 (two) times daily.    Yes  [provider]  PRESCRIPTION MEDICATION Take 20 mg by mouth See admin instructions. Domperidone 10 mg from [place]: Take 2 tablets (20 mg) by mouth three times daily with meals - for diabetic gastroparesis   Yes [provider]  prochlorperazine (COMPAZINE) 5 MG tablet Take 1 tablet (5 mg total) by mouth every 8 (eight) hours as needed for nausea or vomiting. Have been taking it with meals--wean as able Patient taking differently: Take 5 mg by mouth every 8 (eight) hours as needed for nausea or vomiting.  07/08/17  Yes Love, Ivan Anchors, PA-C  ranitidine (ZANTAC) 75 MG tablet Take 75-150 mg by mouth daily.  03/07/18  Yes [provider]  sertraline (ZOLOFT) 50 MG tablet Take 1 tablet (50 mg total) by mouth daily. 05/18/18  Yes Melvenia Beam, MD  tiZANidine (ZANAFLEX) 4 MG tablet Take 4 mg by mouth 2 (two) times daily as needed. 06/30/18  Yes [provider]  topiramate (TOPAMAX) 50 MG tablet Take 1 tablet (50 mg total) by mouth 2 (two) times daily. 07/08/17  Yes Love, Ivan Anchors, PA-C  traMADol (ULTRAM) 50 MG tablet Take 50 mg by mouth 3 (three) times daily as needed for pain. 07/05/18  Yes [provider]  VICTOZA 18 MG/3ML SOPN Inject 1.2 mg into the skin at bedtime.  05/02/18  Yes [provider]  ACCU-CHEK COMPACT PLUS test strip  01/18/18   [provider]  acetaZOLAMIDE (DIAMOX) 250 MG tablet Take 1 tablet (250 mg total) by mouth 3 (three) times daily. 07/16/17   Melvenia Beam, MD    Family History Family History  Problem Relation Age of Onset  . Heart disease Father   . Migraines Neg Hx     Social History Social History   Tobacco Use  . Smoking status: Never Smoker  . Smokeless tobacco: Never Used  Substance Use Topics  . Alcohol use: Yes    Comment: occasional beer  . Drug use: No     Allergies   Carbamazepine; Penicillins; Statins; Tricyclic antidepressants; Atorvastatin; Ezetimibe; Nortriptyline; Quinapril hcl;  Rifampin; and Metoclopramide   Review of Systems Review of Systems  All other systems reviewed and are  negative.    Physical Exam Updated Vital Signs BP (!) 162/74   Pulse 65   Temp 97.7 F (36.5 C) (Oral)   Resp 15   Ht 5\' 2"  (1.575 m)   Wt 59 kg   SpO2 98%   BMI 23.78 kg/m   Physical Exam  Constitutional: She is oriented to person, place, and time. She appears well-developed. No distress.  Elderly, frail.  She appears under nourished.  HENT:  Head: Normocephalic and atraumatic.  Oral mucous membranes are dry  Eyes: Pupils are equal, round, and reactive to light. Conjunctivae and EOM are normal.  Neck: Normal range of motion and phonation normal. Neck supple.  Cardiovascular: Normal rate and regular rhythm.  Pulmonary/Chest: Effort normal and breath sounds normal. She exhibits no tenderness.  Abdominal: Soft. She exhibits no distension. There is no tenderness. There is no guarding.  Musculoskeletal: Normal range of motion. She exhibits no edema, tenderness or deformity.  Neurological: She is alert and oriented to person, place, and time. She exhibits normal muscle tone. Coordination normal.  Dysarthria or aphasia.  She speaks softly.  She answers questions and is cooperative.  Decreased right hand grip strength.  Normal leg extension bilaterally.  No facial asymmetry.  Skin: Skin is warm and dry.  Psychiatric: She has a normal mood and affect. Her behavior is normal.  Nursing note and vitals reviewed.    ED Treatments / Results  Labs (all labs ordered are listed, but only abnormal results are displayed) Labs Reviewed  APTT - Abnormal; Notable for the following components:      Result Value   aPTT 22 (*)    All other components within normal limits  CBC - Abnormal; Notable for the following components:   WBC 10.8 (*)    All other components within normal limits  COMPREHENSIVE METABOLIC PANEL - Abnormal; Notable for the following components:   Potassium 3.2 (*)      Glucose, Bld 130 (*)    BUN 38 (*)    Creatinine, Ser 1.27 (*)    GFR calc non Af Amer 43 (*)    GFR calc Af Amer 50 (*)    All other components within normal limits  I-STAT CHEM 8, ED - Abnormal; Notable for the following components:   BUN 40 (*)    Creatinine, Ser 1.40 (*)    Glucose, Bld 128 (*)    All other components within normal limits  PROTIME-INR  DIFFERENTIAL  URINALYSIS, ROUTINE W REFLEX MICROSCOPIC  I-STAT TROPONIN, ED  CBG MONITORING, ED    EKG EKG Interpretation  Date/Time:  Wednesday July 13 2018 10:56:57 EDT Ventricular Rate:  61 PR Interval:  154 QRS Duration: 70 QT Interval:  422 QTC Calculation: 424 R Axis:   1 Text Interpretation:  Normal sinus rhythm Possible Left atrial enlargement Anterior infarct , age undetermined Abnormal ECG since last tracing no significant change Confirmed by Daleen Bo [phone]) on 07/13/2018 12:03:05 PM   Radiology Dg Chest 2 View  Result Date: 07/13/2018 CLINICAL DATA:  Generalized weakness.  Hypertension. EXAM: CHEST - 2 VIEW COMPARISON:  June 23, 2017 FINDINGS: There is mild scarring in the left base with questionable small left pleural effusion. Lungs elsewhere clear. Heart is mildly enlarged with pulmonary vascularity normal. No adenopathy. There is aortic atherosclerosis. No evident bone lesions. IMPRESSION: Mild scarring left base with questionable small left pleural effusion. Lungs elsewhere clear. Mild cardiac prominence. There is aortic atherosclerosis. Aortic Atherosclerosis (ICD10-I70.0). Electronically Signed   By: Gwyndolyn Saxon  Jasmine December III M.D.   On: 07/13/2018 13:13   Ct Head Wo Contrast  Result Date: 07/13/2018 CLINICAL DATA:  Altered mental status EXAM: CT HEAD WITHOUT CONTRAST TECHNIQUE: Contiguous axial images were obtained from the base of the skull through the vertex without intravenous contrast. COMPARISON:  06/23/2017, 06/04/2017 FINDINGS: Brain: Brain atrophy noted. Chronic white matter  microvascular changes. Progressive subcortical white matter scattered hypodensities throughout both cerebral hemispheres, worse in the left frontal lobe compatible with evolutionary changes of previous multifocal infarcts from 06/04/2017. No acute intracranial hemorrhage, mass lesion, definite new infarction, midline shift, herniation, hydrocephalus, or extra-axial fluid collection. No focal mass effect or edema. Cisterns are patent. Cerebellar atrophy as well. Vascular: Intracranial atherosclerosis.  No hyperdense vessel. Skull: Normal. Negative for fracture or focal lesion. Sinuses/Orbits: No acute finding. Other: None. IMPRESSION: Chronic brain atrophy and white matter microvascular ischemic changes. Evolutionary changes of multifocal subcortical infarcts bilaterally, largest in the high left frontal lobe when compared 06/04/2017 MRI. No acute intracranial hemorrhage. Electronically Signed   By: Jerilynn Mages.  Shick M.D.   On: 07/13/2018 13:07   Mr Brain Wo Contrast  Result Date: 07/13/2018 CLINICAL DATA:  Altered mental status, somnolent. History of stroke and RIGHT-sided weakness. History of seizures, pseudotumor cerebral with lumboperitoneal shunt, hypertension, hypercholesterolemia, diabetes. EXAM: MRI HEAD WITHOUT CONTRAST TECHNIQUE: Multiplanar, multiecho pulse sequences of the brain and surrounding structures were obtained without intravenous contrast. COMPARISON:  CT HEAD July 13, 2018 and MRI head June 04, 2017. FINDINGS: INTRACRANIAL CONTENTS: Multifocal reduced diffusion LEFT frontoparietal lobes with low ADC values. LEFT frontoparietal encephalomalacia with mild hemosiderin staining and chronic microhemorrhages. Old bilateral basal ganglia infarcts. Multifocal small areas RIGHT frontal encephalomalacia. Old small LEFT cerebellar infarct. Patchy supratentorial and pontine white matter FLAIR T2 hyperintensities. No midline shift, mass effect or masses. Mild parenchymal brain volume loss. No  hydrocephalus. No abnormal extra-axial fluid collections. VASCULAR: Normal major intracranial vascular flow voids present at skull base. SKULL AND UPPER CERVICAL SPINE: No abnormal sellar expansion. No suspicious calvarial bone marrow signal. Craniocervical junction maintained. SINUSES/ORBITS: The mastoid air-cells and included paranasal sinuses are well-aerated.The included ocular globes and orbital contents are non-suspicious. Status post bilateral ocular lens implants. OTHER: Patient is edentulous. IMPRESSION: 1. Acute multifocal LEFT frontoparietal/MCA territory infarcts 2. Old LEFT greater than RIGHT MCA territory infarcts. Old basal ganglia and cerebellar infarcts. 3. Moderate chronic small vessel ischemic changes. 4. Mild parenchymal brain volume loss. 5. Acute findings discussed with and reconfirmed by Dr.Carleena Mires on 07/13/2018 at 5:24 pm. Electronically Signed   By: Elon Alas M.D.   On: 07/13/2018 17:26    Procedures .Critical Care Performed by: Daleen Bo, MD Authorized by: Daleen Bo, MD   Critical care provider statement:    Critical care time (minutes):  35   Critical care start time:  07/13/2018 12:50 PM   Critical care end time:  07/13/2018 6:09 PM   Critical care time was exclusive of:  Separately billable procedures and treating other patients   Critical care was necessary to treat or prevent imminent or life-threatening deterioration of the following conditions:  CNS failure or compromise   Critical care was time spent personally by me on the following activities:  Blood draw for specimens, development of treatment plan with patient or surrogate, discussions with consultants, evaluation of patient's response to treatment, examination of patient, obtaining history from patient or surrogate, ordering and performing treatments and interventions, ordering and review of laboratory studies, pulse oximetry, re-evaluation of patient's condition, review of old charts  and  ordering and review of radiographic studies   (including critical care time)  Medications Ordered in ED Medications  0.9 %  sodium chloride infusion ( Intravenous Stopped 07/13/18 1815)  dextrose 5 %-0.45 % sodium chloride infusion (has no administration in time range)  sodium chloride 0.9 % bolus 500 mL (0 mLs Intravenous Stopped 07/13/18 1519)     Initial Impression / Assessment and Plan / ED Course  I have reviewed the triage vital signs and the nursing notes.  Pertinent labs & imaging results that were available during my care of the patient were reviewed by me and considered in my medical decision making (see chart for details).  Clinical Course as of Jul 14 1839  Wed Jul 13, 2018  1326 No change in clinical status.  Patient unable to have swallow screen done because of prior dysphasia.  Reported CT findings to husband, and we discussed further evaluation with MRI to assess for degree of changes seen on CT as acute versus chronic.  He agrees to proceed in this fashion.   [EW]  1805 Low  APTT(!) [EW]  1805 Normal  Differential [EW]  1805 Normal  Protime-INR [EW]  1805 Normal except mild elevation white count  CBC(!) [EW]  1805 Normal except elevated BUN and creatinine, and glucose.  I-Stat Chem 8, ED(!) [EW]  1805 Normal  I-stat troponin, ED [EW]  1805 Normal except potassium low, glucose high, BUN high, creatinine high  Comprehensive metabolic panel(!) [EW]  4098 No acute abnormality.  Images reviewed by me  CT HEAD WO CONTRAST [EW]  1806 No acute left brain stroke  MR BRAIN WO CONTRAST [EW]    Clinical Course User Index [EW] Daleen Bo, MD     Patient Vitals for the past 24 hrs:  BP Temp Temp src Pulse Resp SpO2 Height Weight  07/13/18 1800 (!) 162/74 - - 65 15 98 % - -  07/13/18 1730 (!) 145/87 - - 64 16 99 % - -  07/13/18 1715 (!) 151/73 - - 61 15 98 % - -  07/13/18 1545 138/80 - - (!) 59 15 98 % - -  07/13/18 1515 (!) 149/80 - - 62 11 99 % - -  07/13/18  1500 132/61 - - (!) 59 13 98 % - -  07/13/18 1445 134/70 - - (!) 56 13 97 % - -  07/13/18 1430 137/67 - - 60 16 96 % - -  07/13/18 1400 (!) 132/59 - - 60 10 99 % - -  07/13/18 1330 135/62 - - (!) 56 14 98 % - -  07/13/18 1315 (!) 144/62 - - (!) 55 12 97 % - -  07/13/18 1230 (!) 142/63 - - (!) 58 16 98 % - -  07/13/18 1215 (!) 148/90 - - (!) 59 13 98 % - -  07/13/18 1200 138/60 - - (!) 54 16 99 % - -  07/13/18 1106 - - - - - - 5\' 2"  (1.575 m) 59 kg  07/13/18 1103 (!) 150/83 97.7 F (36.5 C) Oral (!) 59 14 100 % - -    6:07 PM Reevaluation with update and discussion. After initial assessment and treatment, an updated evaluation reveals she remains alert and communicative.  Speech is somewhat more clear at this time. Daleen Bo   Medical Decision Making: Patient with unresponsiveness per report of husband, never unresponsive in the ED, apprehensive evaluation required. New left brain CVA. VAN negative.  He will require hospitalization for further  evaluation and neurology consultation.  CRITICAL CARE-S Performed by: Daleen Bo  Nursing Notes Reviewed/ Care Coordinated Applicable Imaging Reviewed Interpretation of Laboratory Data incorporated into ED treatment  6:09 PM-Consult complete with hospitalist. Patient case explained and discussed.  She agrees to admit patient for further evaluation and treatment.  Asked that I contact neurology, for consult, which we will do.  Call ended at 6:38 PM  Plan: Admit  Final Clinical Impressions(s) / ED Diagnoses   Final diagnoses:  Cerebrovascular accident (CVA), unspecified mechanism [facility])    ED Discharge Orders    None       Daleen Bo, MD 07/13/18 1840

## 2018-07-13 NOTE — ED Notes (Signed)
Pt returned from CT °

## 2018-07-14 ENCOUNTER — Encounter (HOSPITAL_COMMUNITY): Payer: Self-pay | Admitting: Radiology

## 2018-07-14 ENCOUNTER — Inpatient Hospital Stay (HOSPITAL_COMMUNITY): Payer: Medicare Other

## 2018-07-14 DIAGNOSIS — E119 Type 2 diabetes mellitus without complications: Secondary | ICD-10-CM

## 2018-07-14 DIAGNOSIS — I6522 Occlusion and stenosis of left carotid artery: Secondary | ICD-10-CM

## 2018-07-14 DIAGNOSIS — I10 Essential (primary) hypertension: Secondary | ICD-10-CM

## 2018-07-14 DIAGNOSIS — R1312 Dysphagia, oropharyngeal phase: Secondary | ICD-10-CM

## 2018-07-14 DIAGNOSIS — E782 Mixed hyperlipidemia: Secondary | ICD-10-CM

## 2018-07-14 DIAGNOSIS — G8191 Hemiplegia, unspecified affecting right dominant side: Secondary | ICD-10-CM

## 2018-07-14 LAB — GLUCOSE, CAPILLARY
GLUCOSE-CAPILLARY: 100 mg/dL — AB (ref 70–99)
GLUCOSE-CAPILLARY: 110 mg/dL — AB (ref 70–99)
Glucose-Capillary: 131 mg/dL — ABNORMAL HIGH (ref 70–99)
Glucose-Capillary: 85 mg/dL (ref 70–99)
Glucose-Capillary: 90 mg/dL (ref 70–99)
Glucose-Capillary: 98 mg/dL (ref 70–99)

## 2018-07-14 LAB — HIV ANTIBODY (ROUTINE TESTING W REFLEX): HIV Screen 4th Generation wRfx: NONREACTIVE

## 2018-07-14 LAB — CBC
HCT: 39.3 % (ref 36.0–46.0)
Hemoglobin: 12.4 g/dL (ref 12.0–15.0)
MCH: 27.4 pg (ref 26.0–34.0)
MCHC: 31.6 g/dL (ref 30.0–36.0)
MCV: 86.8 fL (ref 78.0–100.0)
Platelets: 259 10*3/uL (ref 150–400)
RBC: 4.53 MIL/uL (ref 3.87–5.11)
RDW: 14.6 % (ref 11.5–15.5)
WBC: 8.3 10*3/uL (ref 4.0–10.5)

## 2018-07-14 LAB — ECHOCARDIOGRAM COMPLETE
HEIGHTINCHES: 62 in
WEIGHTICAEL: 2080 [oz_av]

## 2018-07-14 LAB — LIPID PANEL
Cholesterol: 258 mg/dL — ABNORMAL HIGH (ref 0–200)
HDL: 32 mg/dL — ABNORMAL LOW (ref >40)
LDL Cholesterol: UNDETERMINED mg/dL (ref 0–99)
Total CHOL/HDL Ratio: 8.1 RATIO
Triglycerides: 420 mg/dL — ABNORMAL HIGH (ref <150)
VLDL: UNDETERMINED mg/dL (ref 0–40)

## 2018-07-14 LAB — BASIC METABOLIC PANEL
Anion gap: 9 (ref 5–15)
BUN: 16 mg/dL (ref 8–23)
CO2: 21 mmol/L — ABNORMAL LOW (ref 22–32)
Calcium: 9 mg/dL (ref 8.9–10.3)
Chloride: 111 mmol/L (ref 98–111)
Creatinine, Ser: 0.88 mg/dL (ref 0.44–1.00)
GFR calc Af Amer: >60 mL/min (ref >60)
GFR calc non Af Amer: >60 mL/min (ref >60)
Glucose, Bld: 95 mg/dL (ref 70–99)
Potassium: 3.6 mmol/L (ref 3.5–5.1)
Sodium: 141 mmol/L (ref 135–145)

## 2018-07-14 LAB — HEMOGLOBIN A1C
HEMOGLOBIN A1C: 6.3 % — AB (ref 4.8–5.6)
Mean Plasma Glucose: 134.11 mg/dL

## 2018-07-14 LAB — PLATELET INHIBITION P2Y12: PLATELET FUNCTION P2Y12: 258 [PRU] (ref 194–418)

## 2018-07-14 LAB — MAGNESIUM: Magnesium: 1.7 mg/dL (ref 1.7–2.4)

## 2018-07-14 LAB — HEPARIN LEVEL (UNFRACTIONATED): HEPARIN UNFRACTIONATED: 0.49 [IU]/mL (ref 0.30–0.70)

## 2018-07-14 LAB — PHOSPHORUS: Phosphorus: 2.9 mg/dL (ref 2.5–4.6)

## 2018-07-14 LAB — PREALBUMIN: PREALBUMIN: 24.9 mg/dL (ref 18–38)

## 2018-07-14 MED ORDER — POTASSIUM CHLORIDE 10 MEQ/100ML IV SOLN
10.0000 meq | INTRAVENOUS | Status: AC
  Administered 2018-07-14 (×2): 10 meq via INTRAVENOUS
  Filled 2018-07-14 (×2): qty 100

## 2018-07-14 MED ORDER — IOPAMIDOL (ISOVUE-370) INJECTION 76%
50.0000 mL | Freq: Once | INTRAVENOUS | Status: AC | PRN
  Administered 2018-07-14: 50 mL via INTRAVENOUS

## 2018-07-14 MED ORDER — ONDANSETRON HCL 4 MG/2ML IJ SOLN
4.0000 mg | Freq: Four times a day (QID) | INTRAMUSCULAR | Status: DC | PRN
  Administered 2018-07-14 – 2018-08-14 (×23): 4 mg via INTRAVENOUS
  Filled 2018-07-14 (×23): qty 2

## 2018-07-14 MED ORDER — LORAZEPAM 2 MG/ML IJ SOLN
0.5000 mg | Freq: Once | INTRAMUSCULAR | Status: AC
  Administered 2018-07-14: 0.5 mg via INTRAVENOUS
  Filled 2018-07-14: qty 1

## 2018-07-14 MED ORDER — HEPARIN (PORCINE) IN NACL 100-0.45 UNIT/ML-% IJ SOLN
950.0000 [IU]/h | INTRAMUSCULAR | Status: DC
  Administered 2018-07-14: 750 [IU]/h via INTRAVENOUS
  Administered 2018-07-16: 650 [IU]/h via INTRAVENOUS
  Administered 2018-07-18: 600 [IU]/h via INTRAVENOUS
  Administered 2018-07-20: 550 [IU]/h via INTRAVENOUS
  Administered 2018-07-21: 500 [IU]/h via INTRAVENOUS
  Administered 2018-07-23: 600 [IU]/h via INTRAVENOUS
  Administered 2018-07-25: 800 [IU]/h via INTRAVENOUS
  Administered 2018-07-26: 850 [IU]/h via INTRAVENOUS
  Filled 2018-07-14 (×12): qty 250

## 2018-07-14 NOTE — Evaluation (Signed)
Clinical/Bedside Swallow Evaluation Patient Details  Name: [name] MRN: [id number] Date of Birth: 1951/04/01  Today's Date: 07/14/2018 Time: SLP Start Time (ACUTE ONLY): 1056 SLP Stop Time (ACUTE ONLY): 1115 SLP Time Calculation (min) (ACUTE ONLY): 19 min  Past Medical History:  Past Medical History:  Diagnosis Date  . Anemia    takes iron supplement  . Anesthesia complication    disorientation due to pseudotumor  . Diabetes mellitus    IDDM  . Family history of anesthesia complication 49 yrs ago   brother stopped breathing for a minute or two  . GERD (gastroesophageal reflux disease)   . Headache(784.0)    due to pseudotumor; daily headache  . Hypercholesteremia    unable to tolerate statins  . Hypertension    under control with meds., has been on med. x 5 yr.  . Peripheral neuropathy   . PONV (postoperative nausea and vomiting)   . 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 Seizures ([place])    due to shunt failure; last seizure 2007  . Synovitis of ankle 04/2013   left  . Wears dentures    full   Past Surgical History:  Past Surgical History:  Procedure Laterality Date  . ANKLE ARTHROSCOPY Left 05/18/2013   Procedure: LEFT ANKLE ARTHROSCOPY WITH DEBRIDEMENT,  SUBTALAR OPEN DEBRIDEMENT ;  Surgeon: Wylene Simmer, MD;  Location: [place];  Service: Orthopedics;  Laterality: Left;  . APPENDECTOMY     as a child  . BALLOON DILATION N/A 07/04/2013   Procedure: BALLOON DILATION;  Surgeon: Garlan Fair, MD;  Location: [facility];  Service: Endoscopy;  Laterality: N/A;  . BLADDER SUSPENSION    . BREAST LUMPECTOMY W/ NEEDLE LOCALIZATION Left 05/22/2011  . CAROTID ANGIOGRAPHY Bilateral 06/03/2017   Procedure: Bilateral Carotid Angiography;  Surgeon: Conrad [place], MD;  Location: [facility];  Service: Cardiovascular;  Laterality: Bilateral;  . COLONOSCOPY WITH PROPOFOL N/A 05/14/2014   Procedure: COLONOSCOPY WITH PROPOFOL;  Surgeon:  Garlan Fair, MD;  Location: [facility];  Service: Endoscopy;  Laterality: N/A;  . ESOPHAGEAL DILATION  06/23/2006; 08/05/2004  . ESOPHAGOGASTRODUODENOSCOPY N/A 07/04/2013   Procedure: ESOPHAGOGASTRODUODENOSCOPY (EGD);  Surgeon: Garlan Fair, MD;  Location: [facility];  Service: Endoscopy;  Laterality: N/A;  . ESOPHAGOGASTRODUODENOSCOPY (EGD) WITH PROPOFOL N/A 05/14/2014   Procedure: ESOPHAGOGASTRODUODENOSCOPY (EGD) WITH PROPOFOL;  Surgeon: Garlan Fair, MD;  Location: [facility];  Service: Endoscopy;  Laterality: N/A;  . IR ANGIO VERTEBRAL SEL SUBCLAVIAN INNOMINATE UNI R MOD SED  06/03/2017  . IR GASTROSTOMY TUBE MOD SED  06/25/2017  . IR GASTROSTOMY TUBE REMOVAL  08/20/2017  . IR PERCUTANEOUS ART THROMBECTOMY/INFUSION INTRACRANIAL INC DIAG ANGIO  06/03/2017  . IR PERCUTANEOUS ART THROMBECTOMY/INFUSION INTRACRANIAL INC DIAG ANGIO  06/03/2017  . IR RADIOLOGIST EVAL & MGMT  09/07/2017  . LUMBAR PERITONEAL SHUNT     x 2  . OVARIAN CYST REMOVAL     age 53  . RADIOLOGY WITH ANESTHESIA N/A 06/03/2017   Procedure: RADIOLOGY WITH ANESTHESIA;  Surgeon: Luanne Bras, MD;  Location: [facility];  Service: Radiology;  Laterality: N/A;  . SHUNT REVISION  2007  . TOTAL ABDOMINAL HYSTERECTOMY W/ BILATERAL SALPINGOOPHORECTOMY  1994   age 22s  . WRIST SURGERY Left 2014    [name]   HPI:  [name] is a 67 y.o. female with medical history significant of DM 2, CVA with right sided deficits and dysphagia requiring PEG tube, anemia,  GERD, HLD, HTN seizures disorder, chronic refractory headaches, debility, sp PEG and Trach now removed. Admitted with decreased responsiveness. MRI showed acute and subacute infarcts in the left hemisphere in the left MCA distribution raising possibility of embolic source. Note that basedon 05/2018 OP SLP therapy documentation, patient tolerating a regular diet, thin liquids.    Assessment / Plan / Recommendation Clinical Impression  Patient presents with  inconsistent s/s of aspiration with both liquids and solids charcterized by strong cough response post swallow. Although patient denies its relation to swallowing dysfunction, combination of dysphagia history and acute CVA with new deficits, raises concern for dysphagia. Recommend MBS to determine potential to initiate a po diet.  SLP Visit Diagnosis: Dysphagia, pharyngeal phase (R13.13)    Aspiration Risk  Moderate aspiration risk    Diet Recommendation NPO   Medication Administration: Via alternative means    Other  Recommendations Oral Care Recommendations: Oral care QID   Follow up Recommendations (TBD)             Prognosis Prognosis for Safe Diet Advancement: Good      Swallow Study   General HPI: [name] is a 67 y.o. female with medical history significant of DM 2, CVA with right sided deficits and dysphagia requiring PEG tube, anemia, GERD, HLD, HTN seizures disorder, chronic refractory headaches, debility, sp PEG and Trach now removed. Admitted with decreased responsiveness. MRI showed acute and subacute infarcts in the left hemisphere in the left MCA distribution raising possibility of embolic source. Note that basedon 05/2018 OP SLP therapy documentation, patient tolerating a regular diet, thin liquids.  Type of Study: Bedside Swallow Evaluation Previous Swallow Assessment: MBS 07/02/17-rec dysphagia 2 with thin liquid with largely flash penetration of liquids Diet Prior to this Study: NPO Temperature Spikes Noted: No Respiratory Status: Room air History of Recent Intubation: No Behavior/Cognition: Alert;Cooperative;Requires cueing Oral Cavity Assessment: Within Functional Limits Oral Care Completed by SLP: Recent completion by staff Oral Cavity - Dentition: Edentulous Vision: Functional for self-feeding Self-Feeding Abilities: Able to feed self;Needs assist Patient Positioning: Upright in bed Baseline Vocal Quality: Low vocal intensity;Hoarse;Other  (comment);Breathy(frequent pitch breaks) Volitional Cough: Strong Volitional Swallow: Able to elicit    Oral/Motor/Sensory Function Overall Oral Motor/Sensory Function: Generalized oral weakness   Ice Chips Ice chips: Impaired Pharyngeal Phase Impairments: Cough - Immediate   Thin Liquid Thin Liquid: Impaired Presentation: Cup;Self Fed;Straw Pharyngeal  Phase Impairments: Cough - Immediate    Nectar Thick Nectar Thick Liquid: Not tested   Honey Thick Honey Thick Liquid: Not tested   Puree Puree: Within functional limits Presentation: Spoon   Solid     Solid: Impaired Pharyngeal Phase Impairments: Cough - Delayed     Morgan Rolon MA, CCC-SLP   Abner Ardis Meryl 07/14/2018,11:59 AM

## 2018-07-14 NOTE — Progress Notes (Signed)
SLP Cancellation Note  Patient Details Name: [name] MRN: [id number] DOB: [birth year]   Cancelled treatment:       Reason Eval/Treat Not Completed: Patient at procedure or test/unavailable(leaving for CT)  Gabriel Rainwater MA, CCC-SLP   Starlyn Droge Meryl 07/14/2018, 10:12 AM

## 2018-07-14 NOTE — Evaluation (Signed)
Occupational Therapy Evaluation Patient Details Name: [name] MRN: [id number] DOB: May 25, 1951 Today's Date: 07/14/2018    History of Present Illness Patient is a 67 y/o female presenting to the ED on 07/13/18 with primary complaints of difficulty with speech. Patient with a PMH significant for DM 2, CVA, anemia, GERD, HLD, HTN seizures disorder.Pseudotumor cerebri, chronic refractory headaches, debility, sp PEG and Trach now removed. Initial CT head unremarkable. MRI revealing Acute multifocal LEFT frontoparietal/MCA territory infarcts.  Spouse reports carotid endarterectomy is planned this Monday.    Clinical Impression   PTA patient used RW for mobility with occasional assist and required assistance for some ADLs (UB/LB dressing, bathing and intermittently with toileting).  She was admitted for above and limited by cognition, fear of falling, decreased activity tolerance, decreased functional use of R UE from previous CVA, impaired balance, and generalized weakness. Today, requires min-mod assist for bed mobility, min assist for grooming, mod assist for UB bathing, max assist for UB dressing, max-total assist for LB ADLs, and mod assist for sit to stand (declinig further mobility).  Pt will benefit from continued OT services while admitted and after dc at CIR level in order to optimize independence with ADLs and mobility in order to return to PLOF with support of husband.     Follow Up Recommendations  CIR;Supervision/Assistance - 24 hour    Equipment Recommendations  None recommended by OT    Recommendations for Other Services Rehab consult     Precautions / Restrictions Precautions Precautions: Fall Precaution Comments: highly fearful of falling Restrictions Weight Bearing Restrictions: No Other Position/Activity Restrictions: husband reporting a fall in the home recently - noted yellow/purple bruising to R foot      Mobility Bed Mobility Overal bed mobility: Needs  Assistance Bed Mobility: Supine to Sit;Sit to Supine     Supine to sit: Mod assist Sit to supine: Min assist   General bed mobility comments: mod assist for trunk support and initate movement of B LEs to EOB, min assist for LEs to supine   Transfers Overall transfer level: Needs assistance Equipment used: None Transfers: Sit to/from Stand Sit to Stand: Mod assist         General transfer comment: moderate assist to power up into standing at EOB, cueing for hand placement and forward lean; limited by fear of falling     Balance Overall balance assessment: Needs assistance Sitting-balance support: Feet supported;Single extremity supported Sitting balance-Leahy Scale: Fair     Standing balance support: Bilateral upper extremity supported;During functional activity Standing balance-Leahy Scale: Poor Standing balance comment: reliant on external support                           ADL either performed or assessed with clinical judgement   ADL Overall ADL's : Needs assistance/impaired   Eating/Feeding Details (indicate cue type and reason): NPO Grooming: Minimal assistance;Sitting   Upper Body Bathing: Moderate assistance;Sitting   Lower Body Bathing: Total assistance;Bed level   Upper Body Dressing : Maximal assistance;Sitting   Lower Body Dressing: Total assistance;Bed level     Toilet Transfer Details (indicate cue type and reason): unable to assess, anticipate maximal assistance Toileting- Clothing Manipulation and Hygiene: Total assistance;Bed level       Functional mobility during ADLs: Moderate assistance General ADL Comments: Patient limited by anxiety and highly fearful of falling.      Vision Baseline Vision/History: Wears glasses Wears Glasses: (does not have with her ) Patient  Visual Report: Blurring of vision Additional Comments: further assessment recommended     Perception     Praxis Praxis Praxis tested?: Deficits Deficits:  Organization    Pertinent Vitals/Pain Pain Assessment: Faces Faces Pain Scale: Hurts even more Pain Location: generalized Pain Descriptors / Indicators: Grimacing;Moaning Pain Intervention(s): Limited activity within patient's tolerance;Repositioned     Hand Dominance     Extremity/Trunk Assessment Upper Extremity Assessment Upper Extremity Assessment: LUE deficits/detail;RUE deficits/detail RUE Deficits / Details: spastic hypertonicity with shoulder flexion to 90 degrees, WFL elbow, neutral wrist and index finger in extension and minimal grasp of digits  RUE: Unable to fully assess due to pain RUE Sensation: WNL(per pt report) RUE Coordination: decreased fine motor;decreased gross motor LUE Deficits / Details: grossly 3+/5 MMT   Lower Extremity Assessment Lower Extremity Assessment: Defer to PT evaluation   Cervical / Trunk Assessment Cervical / Trunk Assessment: Kyphotic   Communication Communication Communication: No difficulties;Other (comment)(speaks in a forced whisper )   Cognition Arousal/Alertness: Awake/alert Behavior During Therapy: Anxious;Flat affect Overall Cognitive Status: History of cognitive impairments - at baseline Area of Impairment: Attention;Memory;Following commands;Safety/judgement;Awareness;Problem solving                   Current Attention Level: Selective Memory: Decreased short-term memory Following Commands: Follows one step commands inconsistently;Follows one step commands with increased time Safety/Judgement: Decreased awareness of safety Awareness: Emergent Problem Solving: Slow processing;Decreased initiation;Difficulty sequencing;Requires verbal cues;Requires tactile cues General Comments: spouse reports baseline    General Comments  spouse present and supportive    Exercises     Shoulder Instructions      Home Living Family/patient expects to be discharged to:: Private residence Living Arrangements: Spouse/significant  other Available Help at Discharge: Family;Available PRN/intermittently Type of Home: Mobile home Home Access: Stairs to enter Entrance Stairs-Number of Steps: 4 Entrance Stairs-Rails: Can reach both Home Layout: One level     Bathroom Shower/Tub: Teacher, early years/pre: Standard     Home Equipment: Environmental consultant - 2 wheels;Bedside commode;Grab bars - tub/shower;Tub bench          Prior Functioning/Environment Level of Independence: Needs assistance  Gait / Transfers Assistance Needed: reports mobility with RW independently, occasionally needing assistance  ADL's / Homemaking Assistance Needed: able to complete eating/grooming without assist, requires assistance for UB/LB dressing/bathing and sometimes assist for toileting (depending on the day)             OT Problem List: Decreased strength;Decreased range of motion;Decreased activity tolerance;Impaired balance (sitting and/or standing);Decreased coordination;Decreased cognition;Decreased safety awareness;Decreased knowledge of precautions;Decreased knowledge of use of DME or AE;Pain      OT Treatment/Interventions: Self-care/ADL training;Therapeutic exercise;Neuromuscular education;Energy conservation;DME and/or AE instruction;Therapeutic activities;Cognitive remediation/compensation;Patient/family education;Balance training    OT Goals(Current goals can be found in the care plan section) Acute Rehab OT Goals Patient Stated Goal: spouse wants pt to go to rehab  OT Goal Formulation: With patient/family Time For Goal Achievement: 07/28/18 Potential to Achieve Goals: Good  OT Frequency: Min 2X/week   Barriers to D/C:            Co-evaluation              AM-PAC PT "6 Clicks" Daily Activity     Outcome Measure Help from another person eating meals?: Total(NPO) Help from another person taking care of personal grooming?: A Little Help from another person toileting, which includes using toliet, bedpan, or  urinal?: Total Help from another person bathing (including washing, rinsing, drying)?: A  Lot Help from another person to put on and taking off regular upper body clothing?: A Lot Help from another person to put on and taking off regular lower body clothing?: Total 6 Click Score: 10   End of Session Equipment Utilized During Treatment: Gait belt Nurse Communication: Mobility status  Activity Tolerance: Patient tolerated treatment well Patient left: in bed;with call bell/phone within reach;with bed alarm set;with family/visitor present  OT Visit Diagnosis: Unsteadiness on feet (R26.81);Other abnormalities of gait and mobility (R26.89);Other symptoms and signs involving the nervous system (R29.898);Other symptoms and signs involving cognitive function;Hemiplegia and hemiparesis;History of falling (Z91.81) Hemiplegia - Right/Left: 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

## 2018-07-14 NOTE — Progress Notes (Signed)
Rehab Admissions Coordinator Note:  Patient was screened by Cleatrice Burke for appropriateness for an Inpatient Acute Rehab Consult per PT recommendation. Pt previously admitted to [facility] 05/2017.  At this time, we are recommending Inpatient Rehab consult.  Danne Baxter, RN, MSN Rehab Admissions Coordinator [phone] 07/14/2018 2:49 PM

## 2018-07-14 NOTE — Progress Notes (Signed)
Initial Nutrition Assessment  DOCUMENTATION CODES:   Not applicable  INTERVENTION:  If unable to advance diet, Recommend Jevity 1.2 formula at rate of 20 ml/hr and increase by 10 ml every 4 hours to goal rate of 60 ml/hr to provide 1728 kcal, 80 grams of protein, and 1166 ml water.  RD to continue to monitor.   NUTRITION DIAGNOSIS:   Inadequate oral intake related to inability to eat as evidenced by NPO status.  GOAL:   Patient will meet greater than or equal to 90% of their needs  MONITOR:   Labs, Skin, Weight trends, I & O's, Diet advancement  REASON FOR ASSESSMENT:   Consult Assessment of nutrition requirement/status  ASSESSMENT:   67 y.o. female with medical history significant of DM 2, CVA, anemia, GERD, HLD, HTN seizures disorder.Pseudotumor cerebri, chronic refractory headaches, debility, sp PEG and Trach now removed. MRI confirmed new CVA  No family at bedside. Pt reports she was eating well PTA with consumption of 3 meals a day. Pt unable to describe usual foods eaten at meals and its amounts. Per weight record, pt with a 24% weight loss in 1 year which is significant for time frame. Swallow evaluation done today per SLP. Pt with moderate aspiration risk and recommends NPO as well as MBS to determine potential to initiate PO diet. If unable to advance diet, may need consideration of enteral nutrition. Tube feeding recommendations stated above.   Labs and medications reviewed.   NUTRITION - FOCUSED PHYSICAL EXAM:    Most Recent Value  Orbital Region  No depletion  Upper Arm Region  No depletion  Thoracic and Lumbar Region  No depletion  Buccal Region  No depletion  Temple Region  No depletion  Clavicle Bone Region  No depletion  Clavicle and Acromion Bone Region  No depletion  Scapular Bone Region  Unable to assess  Dorsal Hand  Unable to assess  Patellar Region  No depletion  Anterior Thigh Region  No depletion  Posterior Calf Region  No depletion  Edema  (RD Assessment)  None  Hair  Reviewed  Eyes  Reviewed  Mouth  Reviewed  Skin  Reviewed  Nails  Reviewed       Diet Order:   Diet Order            Diet NPO time specified  Diet effective now              EDUCATION NEEDS:   Not appropriate for education at this time  Skin:  Skin Assessment: Reviewed RN Assessment  Last BM:  9/25  Height:   Ht Readings from Last 1 Encounters:  07/13/18 5\' 2"  (1.575 m)    Weight:   Wt Readings from Last 1 Encounters:  07/13/18 59 kg    Ideal Body Weight:  50 kg  BMI:  Body mass index is 23.78 kg/m.  Estimated Nutritional Needs:   Kcal:  1600-1800  Protein:  70-85 grams  Fluid:  1.6 - 1.8 L/day    Corrin Parker, MS, RD, LDN Pager # [phone] After hours/ weekend pager # [phone]

## 2018-07-14 NOTE — Consult Note (Addendum)
[facility] NOTE   MRN : [id number]  Reason for Consult: Left ICA stenosis  Referring Physician: Dr. Erlinda Hong  History of Present Illness: 67 y/o female with symptoms of stroke generalized weakness, speech deficits, but able to follow directions.  She was eating and speaking well s/p left CVA right side paralysis the day before.    She has history of stroke events evident on CTA post angiogram by Dr. Bridgett Larsson a year ago 6/64/4034 complicated by B embolization ? B mech thrombectomy.  The angio revealed < 50% stenosis and she was medically managed with anti plalets.   Fortunately some neuro recovery after mech TE of both MCA by IR.   Past medical history:HTN, DM, anemia, Hypercholesterolemia, and left CVA. Post CVA she was managed on 325 Aspirin and Plavix.  The Aspirin was reduced to 81 mg and then about a month ago she was told to discontinue the 81 mg Aspirin.         Current Facility-Administered Medications  Medication Dose Route Frequency Provider Last Rate Last Dose  . 0.9 %  sodium chloride infusion   Intravenous Continuous Toy Baker, MD 75 mL/hr at 07/14/18 0006    . acetaminophen (TYLENOL) tablet 650 mg  650 mg Oral Q4H PRN Toy Baker, MD       Or  . acetaminophen (TYLENOL) solution 650 mg  650 mg Per Tube Q4H PRN Doutova, Anastassia, MD       Or  . acetaminophen (TYLENOL) suppository 650 mg  650 mg Rectal Q4H PRN Doutova, Anastassia, MD      . heparin ADULT infusion 100 units/mL (25000 units/235mL sodium chloride 0.45%)  750 Units/hr Intravenous Continuous Emokpae, Courage, MD      . insulin aspart (novoLOG) injection 0-9 Units  0-9 Units Subcutaneous Q4H Doutova, Anastassia, MD      . insulin glargine (LANTUS) injection 15 Units  15 Units Subcutaneous QHS Toy Baker, MD   15 Units at 07/14/18 0007    Pt meds include: Statin :No Betablocker: Yes ASA: No Other anticoagulants/antiplatelets: plavix  Past Medical  History:  Diagnosis Date  . Anemia    takes iron supplement  . Anesthesia complication    disorientation due to pseudotumor  . Diabetes mellitus    IDDM  . Family history of anesthesia complication 50 yrs ago   brother stopped breathing for a minute or two  . GERD (gastroesophageal reflux disease)   . Headache(784.0)    due to pseudotumor; daily headache  . Hypercholesteremia    unable to tolerate statins  . Hypertension    under control with meds., has been on med. x 5 yr.  . Peripheral neuropathy   . PONV (postoperative nausea and vomiting)   . 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 Seizures ([place])    due to shunt failure; last seizure 2007  . Synovitis of ankle 04/2013   left  . Wears dentures    full    Past Surgical History:  Procedure Laterality Date  . ANKLE ARTHROSCOPY Left 05/18/2013   Procedure: LEFT ANKLE ARTHROSCOPY WITH DEBRIDEMENT,  SUBTALAR OPEN DEBRIDEMENT ;  Surgeon: Wylene Simmer, MD;  Location: [facility];  Service: Orthopedics;  Laterality: Left;  . APPENDECTOMY     as a child  . BALLOON DILATION N/A 07/04/2013   Procedure: BALLOON DILATION;  Surgeon: Garlan Fair, MD;  Location: [facility];  Service: Endoscopy;  Laterality: N/A;  . BLADDER SUSPENSION    .  BREAST LUMPECTOMY W/ NEEDLE LOCALIZATION Left 05/22/2011  . CAROTID ANGIOGRAPHY Bilateral 06/03/2017   Procedure: Bilateral Carotid Angiography;  Surgeon: Conrad Sunrise, MD;  Location: [facility];  Service: Cardiovascular;  Laterality: Bilateral;  . COLONOSCOPY WITH PROPOFOL N/A 05/14/2014   Procedure: COLONOSCOPY WITH PROPOFOL;  Surgeon: Garlan Fair, MD;  Location: [facility];  Service: Endoscopy;  Laterality: N/A;  . ESOPHAGEAL DILATION  06/23/2006; 08/05/2004  . ESOPHAGOGASTRODUODENOSCOPY N/A 07/04/2013   Procedure: ESOPHAGOGASTRODUODENOSCOPY (EGD);  Surgeon: Garlan Fair, MD;  Location: [facility];  Service: Endoscopy;  Laterality: N/A;  .  ESOPHAGOGASTRODUODENOSCOPY (EGD) WITH PROPOFOL N/A 05/14/2014   Procedure: ESOPHAGOGASTRODUODENOSCOPY (EGD) WITH PROPOFOL;  Surgeon: Garlan Fair, MD;  Location: [facility];  Service: Endoscopy;  Laterality: N/A;  . IR ANGIO VERTEBRAL SEL SUBCLAVIAN INNOMINATE UNI R MOD SED  06/03/2017  . IR GASTROSTOMY TUBE MOD SED  06/25/2017  . IR GASTROSTOMY TUBE REMOVAL  08/20/2017  . IR PERCUTANEOUS ART THROMBECTOMY/INFUSION INTRACRANIAL INC DIAG ANGIO  06/03/2017  . IR PERCUTANEOUS ART THROMBECTOMY/INFUSION INTRACRANIAL INC DIAG ANGIO  06/03/2017  . IR RADIOLOGIST EVAL & MGMT  09/07/2017  . LUMBAR PERITONEAL SHUNT     x 2  . OVARIAN CYST REMOVAL     age 76  . RADIOLOGY WITH ANESTHESIA N/A 06/03/2017   Procedure: RADIOLOGY WITH ANESTHESIA;  Surgeon: Luanne Bras, MD;  Location: [facility];  Service: Radiology;  Laterality: N/A;  . SHUNT REVISION  2007  . TOTAL ABDOMINAL HYSTERECTOMY W/ BILATERAL SALPINGOOPHORECTOMY  1994   age 53s  . WRIST SURGERY Left 2014    [name]    Social History Social History   Tobacco Use  . Smoking status: Never Smoker  . Smokeless tobacco: Never Used  Substance Use Topics  . Alcohol use: Yes    Comment: occasional beer  . Drug use: No    Family History Family History  Problem Relation Age of Onset  . Heart disease Father   . Migraines Neg Hx     Allergies  Allergen Reactions  . Carbamazepine Hives, Shortness Of Breath and Other (See Comments)  . Penicillins Anaphylaxis, Other (See Comments) and Swelling    Mother, father and brother have history of anaphylaxis reaction to penicillin so pt does not take  My throat swells Familial history of anaphylaxis   . Statins Other (See Comments)    Severe muscle weakness, leg numbness, severe headaches, chest pain   . Tricyclic Antidepressants Other (See Comments)    IMPAIRED MEMORY  . Atorvastatin Other (See Comments)    Severe muscle weakness, leg numbness, severe headaches, chest pain   . Ezetimibe  Other (See Comments)    Severe muscle weakness, leg numbness, severe headaches, chest pain   . Nortriptyline Other (See Comments)    IMPAIRED MEMORY  . Quinapril Hcl Other (See Comments)    Unknown reaction  . Rifampin Diarrhea  . Metoclopramide Nausea And Vomiting and Rash     REVIEW OF SYSTEMS  General: [ ]  Weight loss, [ ]  Fever, [ ]  chills Neurologic: [ ]  Dizziness, [ ]  Blackouts, [ ]  Seizure [x ] Stroke, [ ]  "Mini stroke", [x ] Slurred speech, [ ]  Temporary blindness; [x ] weakness in arms or legs, [ ]  Hoarseness [ ]  Dysphagia Cardiac: [ ]  Chest pain/pressure, [ ]  Shortness of breath at rest [ ]  Shortness of breath with exertion, [ ]  Atrial fibrillation or irregular heartbeat  Vascular: [ ]  Pain in legs with walking, [ ]  Pain in  legs at rest, [ ]  Pain in legs at night,  [ ]  Non-healing ulcer, [ ]  Blood clot in vein/DVT,   Pulmonary: [ ]  Home oxygen, [ ]  Productive cough, [ ]  Coughing up blood, [ ]  Asthma,  [ ]  Wheezing [ ]  COPD Musculoskeletal:  [ ]  Arthritis, [ ]  Low back pain, [ ]  Joint pain Hematologic: [ ]  Easy Bruising, [ ]  Anemia; [ ]  Hepatitis Gastrointestinal: [ ]  Blood in stool, [ ]  Gastroesophageal Reflux/heartburn, Urinary: [ ]  chronic Kidney disease, [ ]  on HD - [ ]  MWF or [ ]  TTHS, [ ]  Burning with urination, [ ]  Difficulty urinating Skin: [ ]  Rashes, [ ]  Wounds Psychological: [ ]  Anxiety, [ ]  Depression  Physical Examination Vitals:   07/14/18 0200 07/14/18 0400 07/14/18 0600 07/14/18 1231  BP: 139/76 (!) 148/69 (!) 147/59 (!) 156/86  Pulse: 76 71 72 (!) 105  Resp:    18  Temp: 97.8 F (36.6 C) (!) 97.5 F (36.4 C)    TempSrc: Oral Oral    SpO2: 100% 98% 98% 97%  Weight:      Height:       Body mass index is 23.78 kg/m.  General:  WDWN in NAD HENT: WNL, speech soft, but clear Eyes: Pupils equal, blurred vision Pulmonary: normal non-labored breathing , without Rales, rhonchi,  wheezing Cardiac: RRR, without  Murmurs, rubs or gallops; No carotid  bruits Abdomen: soft, NT, no masses Skin: no rashes, ulcers noted;  no Gangrene , no cellulitis; no open wounds;   Vascular Exam/Pulses:radial, femoral, DP pulses B   Musculoskeletal: no muscle wasting or atrophy; no edema  Neurologic: A&O X 3; Appropriate Affect ;  SENSATION: normal; MOTOR FUNCTION: right UE and LE paralysis  Speech is fluent/normal   Significant Diagnostic Studies: CBC Lab Results  Component Value Date   WBC 8.3 07/14/2018   HGB 12.4 07/14/2018   HCT 39.3 07/14/2018   MCV 86.8 07/14/2018   PLT 259 07/14/2018    BMET    Component Value Date/Time   NA 141 07/14/2018 0805   NA 137 01/15/2016 1632   K 3.6 07/14/2018 0805   CL 111 07/14/2018 0805   CO2 21 (L) 07/14/2018 0805   GLUCOSE 95 07/14/2018 0805   BUN 16 07/14/2018 0805   BUN 14 01/15/2016 1632   CREATININE 0.88 07/14/2018 0805   CALCIUM 9.0 07/14/2018 0805   GFRNONAA >60 07/14/2018 0805   GFRAA >60 07/14/2018 0805   Estimated Creatinine Clearance: 49.7 mL/min (by C-G formula based on SCr of 0.88 mg/dL).  COAG Lab Results  Component Value Date   INR 1.00 07/13/2018   INR 1.06 06/25/2017   INR 0.99 06/10/2017     Non-Invasive Vascular Imaging:   EXAM: CT ANGIOGRAPHY HEAD AND NECK  TECHNIQUE: Multidetector CT imaging of the head and neck was performed using the standard protocol during bolus administration of intravenous contrast. Multiplanar CT image reconstructions and MIPs were obtained to evaluate the vascular anatomy. Carotid stenosis measurements (when applicable) are obtained utilizing NASCET criteria, using the distal internal carotid diameter as the denominator.  CONTRAST:  40mL ISOVUE-370 IOPAMIDOL (ISOVUE-370) INJECTION 76%  COMPARISON:  Brain MRI 07/13/2018  CTA head 06/23/2017  CTA neck and head 06/03/2017  FINDINGS: CTA NECK FINDINGS  AORTIC ARCH: There is mild calcific atherosclerosis of the aortic arch. There is no aneurysm, dissection or  hemodynamically significant stenosis of the visualized ascending aorta and aortic arch. Conventional 3 vessel aortic branching pattern. The visualized proximal subclavian  arteries are widely patent.  RIGHT CAROTID SYSTEM:  --Common carotid artery: Widely patent origin without common carotid artery dissection or aneurysm.  --Internal carotid artery: No dissection, occlusion or aneurysm. Mild atherosclerotic calcification at the carotid bifurcation without hemodynamically significant stenosis.  --External carotid artery: No acute abnormality.  LEFT CAROTID SYSTEM:  --Common carotid artery: Widely patent origin without common carotid artery dissection or aneurysm.  --Internal carotid artery:There is eccentric fibrofatty plaque within the proximal internal carotid artery that causes a stenosis of approximately 60% by NASCET criteria. The distal internal carotid artery is widely patent.  --External carotid artery: No acute abnormality.  VERTEBRAL ARTERIES: Left dominant configuration. Both origins are normal. No dissection, occlusion or flow-limiting stenosis to the vertebrobasilar confluence.  SKELETON: There is no bony spinal canal stenosis. No lytic or blastic lesion.  OTHER NECK: Normal pharynx, larynx and major salivary glands. No cervical lymphadenopathy. Unremarkable thyroid gland.  UPPER CHEST: No pneumothorax or pleural effusion. No nodules or masses.  CTA HEAD FINDINGS  ANTERIOR CIRCULATION:  --Intracranial internal carotid arteries: Atherosclerotic calcification of the internal carotid arteries at the skull base without hemodynamically significant stenosis.  --Anterior cerebral arteries: Short segment high-grade stenosis versus occlusion of the proximal left A1 segment is unchanged. The anterior communicating artery is patent. Right A1 segment is normal. The A2 segments and distal branches are normal otherwise. The left MCA is  normal.  --Posterior communicating arteries: Present bilaterally.  POSTERIOR CIRCULATION:  --Basilar artery: Normal.  --Posterior cerebral arteries: Normal.  --Superior cerebellar arteries: Normal.  --Inferior cerebellar arteries: Normal anterior and posterior inferior cerebellar arteries.  VENOUS SINUSES: As permitted by contrast timing, patent.  ANATOMIC VARIANTS: None  DELAYED PHASE: No parenchymal contrast enhancement. Redemonstration of acute or subacute infarct in the left frontoparietal MCA territory.  Review of the MIP images confirms the above findings.  IMPRESSION: 1. No emergent large vessel occlusion. 2. Approximately 60% stenosis of the left internal carotid artery by NASCET measurements. The eccentric appearance of the low density plaque within the lumen is unchanged. 3. Severe stenosis of the proximal left A1 segment, slightly worsened. 4. Mild narrowing of the distal portion of the right MCA M1 segment. 5. Acute/subacute infarct of the left frontoparietal MCA territory. 6.  Aortic Atherosclerosis (ICD10-I70.0).   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 Donzetta Matters.   Continue Heparin until 1 hour prior to surgery.  Risks and benefits were discussed with the patient and family by Dr. Donzetta Matters.      Roxy Horseman 07/14/2018 1:05 PM   I have interviewed and examined patient with PA and agree with assessment and plan above. Discussed with husband and Dr. Erlinda Hong with neurology. Given high risk of recurrent cva will tentatively plan for left CEA next week but will discuss further with husband prior.   Kehinde Totzke C. Donzetta Matters, MD [facility] Office: [phone] Pager: [phone]

## 2018-07-14 NOTE — Consult Note (Signed)
ANTICOAGULATION CONSULT NOTE - Initial Consult  Pharmacy Consult for Heparin  Indication: stroke  Allergies  Allergen Reactions  . Carbamazepine Hives, Shortness Of Breath and Other (See Comments)  . Penicillins Anaphylaxis, Other (See Comments) and Swelling    Mother, father and brother have history of anaphylaxis reaction to penicillin so pt does not take  My throat swells Familial history of anaphylaxis   . Statins Other (See Comments)    Severe muscle weakness, leg numbness, severe headaches, chest pain   . Tricyclic Antidepressants Other (See Comments)    IMPAIRED MEMORY  . Atorvastatin Other (See Comments)    Severe muscle weakness, leg numbness, severe headaches, chest pain   . Ezetimibe Other (See Comments)    Severe muscle weakness, leg numbness, severe headaches, chest pain   . Nortriptyline Other (See Comments)    IMPAIRED MEMORY  . Quinapril Hcl Other (See Comments)    Unknown reaction  . Rifampin Diarrhea  . Metoclopramide Nausea And Vomiting and Rash    Patient Measurements: Height: 5\' 2"  (157.5 cm) Weight: 130 lb (59 kg) IBW/kg (Calculated) : 50.1 Heparin Dosing Weight: 59 kg  Vital Signs: Temp: 97.5 F (36.4 C) (09/26 0400) Temp Source: Oral (09/26 0400) BP: 147/59 (09/26 0600) Pulse Rate: 72 (09/26 0600)  Labs: Recent Labs    07/13/18 1109 07/13/18 1125 07/14/18 0805  HGB 13.8 15.0 12.4  HCT 44.7 44.0 39.3  PLT 324  --  259  APTT 22*  --   --   LABPROT 13.1  --   --   INR 1.00  --   --   CREATININE 1.27* 1.40* 0.88    Estimated Creatinine Clearance: 49.7 mL/min (by C-G formula based on SCr of 0.88 mg/dL).   Medical History: Past Medical History:  Diagnosis Date  . Anemia    takes iron supplement  . Anesthesia complication    disorientation due to pseudotumor  . Diabetes mellitus    IDDM  . Family history of anesthesia complication 31 yrs ago   brother stopped breathing for a minute or two  . GERD (gastroesophageal reflux  disease)   . Headache(784.0)    due to pseudotumor; daily headache  . Hypercholesteremia    unable to tolerate statins  . Hypertension    under control with meds., has been on med. x 5 yr.  . Peripheral neuropathy   . PONV (postoperative nausea and vomiting)   . 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 Seizures ([place])    due to shunt failure; last seizure 2007  . Synovitis of ankle 04/2013   left  . Wears dentures    full     Assessment: 67 YO female requiring heparin anticoagulation for acute CVA. Baseline CBC stable.  Goal of Therapy:  Heparin level 0.3-0.5 units/ml Monitor platelets by anticoagulation protocol: Yes   Plan:  Start heparin infusion at 750 units/hr Check anti-Xa level in 8 hours and daily while on heparin Continue to monitor H&H and platelets  Latanya Maudlin 07/14/2018,12:03 PM

## 2018-07-14 NOTE — Progress Notes (Signed)
STROKE TEAM PROGRESS NOTE   SUBJECTIVE (INTERVAL HISTORY) Her speech therapist is at the bedside.  Overall she feels her condition is gradually improving.  She is still lethargic, but moving all extremities symmetrically.  Did not pass swallow at bedside, will do barium study.  CT head and neck showed continue the left ICA bifurcation calcified plaque and soft plaque with thrombosis protruding into the lumen.  Will do heparin IV, consulting vascular surgery.   OBJECTIVE Temp:  [97.5 F (36.4 C)-97.8 F (36.6 C)] 97.5 F (36.4 C) (09/26 0400) Pulse Rate:  [55-105] 105 (09/26 1231) Cardiac Rhythm: Normal sinus rhythm (09/26 0800) Resp:  [10-18] 18 (09/26 1231) BP: (130-164)/(59-95) 156/86 (09/26 1231) SpO2:  [96 %-100 %] 97 % (09/26 1231)  Recent Labs  Lab 07/13/18 2337 07/14/18 0326  GLUCAP 113* 131*   Recent Labs  Lab 07/13/18 1109 07/13/18 1125 07/13/18 2353 07/14/18 0805  NA 138 138  --  141  K 3.2* 3.5  --  3.6  CL 100 103  --  111  CO2 24  --   --  21*  GLUCOSE 130* 128*  --  95  BUN 38* 40*  --  16  CREATININE 1.27* 1.40*  --  0.88  CALCIUM 9.9  --   --  9.0  MG  --   --  1.7  --   PHOS  --   --  2.9  --    Recent Labs  Lab 07/13/18 1109  AST 27  ALT 23  ALKPHOS 121  BILITOT 0.4  PROT 7.2  ALBUMIN 4.0   Recent Labs  Lab 07/13/18 1109 07/13/18 1125 07/14/18 0805  WBC 10.8*  --  8.3  NEUTROABS 7.5  --   --   HGB 13.8 15.0 12.4  HCT 44.7 44.0 39.3  MCV 88.5  --  86.8  PLT 324  --  259   No results for input(s): CKTOTAL, CKMB, CKMBINDEX, TROPONINI in the last 168 hours. Recent Labs    07/13/18 1109  LABPROT 13.1  INR 1.00   No results for input(s): COLORURINE, LABSPEC, PHURINE, GLUCOSEU, HGBUR, BILIRUBINUR, KETONESUR, PROTEINUR, UROBILINOGEN, NITRITE, LEUKOCYTESUR in the last 72 hours.  Invalid input(s): APPERANCEUR     Component Value Date/Time   CHOL 258 (H) 07/14/2018 0007   TRIG 420 (H) 07/14/2018 0007   HDL 32 (L) 07/14/2018 0007    CHOLHDL 8.1 07/14/2018 0007   VLDL UNABLE TO CALCULATE IF TRIGLYCERIDE OVER 400 mg/dL 07/14/2018 0007   LDLCALC UNABLE TO CALCULATE IF TRIGLYCERIDE OVER 400 mg/dL 07/14/2018 0007   Lab Results  Component Value Date   HGBA1C 6.3 (H) 07/13/2018   No results found for: LABOPIA, COCAINSCRNUR, LABBENZ, AMPHETMU, THCU, LABBARB  No results for input(s): ETH in the last 168 hours.  I have personally reviewed the radiological images below and agree with the radiology interpretations.  Dg Chest 2 View  Result Date: 07/13/2018 CLINICAL DATA:  Generalized weakness.  Hypertension. EXAM: CHEST - 2 VIEW COMPARISON:  June 23, 2017 FINDINGS: There is mild scarring in the left base with questionable small left pleural effusion. Lungs elsewhere clear. Heart is mildly enlarged with pulmonary vascularity normal. No adenopathy. There is aortic atherosclerosis. No evident bone lesions. IMPRESSION: Mild scarring left base with questionable small left pleural effusion. Lungs elsewhere clear. Mild cardiac prominence. There is aortic atherosclerosis. Aortic Atherosclerosis (ICD10-I70.0). Electronically Signed   By: Lowella Grip III M.D.   On: 07/13/2018 13:13   Ct Head Wo Contrast  Result Date: 07/13/2018 CLINICAL DATA:  Altered mental status EXAM: CT HEAD WITHOUT CONTRAST TECHNIQUE: Contiguous axial images were obtained from the base of the skull through the vertex without intravenous contrast. COMPARISON:  06/23/2017, 06/04/2017 FINDINGS: Brain: Brain atrophy noted. Chronic white matter microvascular changes. Progressive subcortical white matter scattered hypodensities throughout both cerebral hemispheres, worse in the left frontal lobe compatible with evolutionary changes of previous multifocal infarcts from 06/04/2017. No acute intracranial hemorrhage, mass lesion, definite new infarction, midline shift, herniation, hydrocephalus, or extra-axial fluid collection. No focal mass effect or edema. Cisterns are  patent. Cerebellar atrophy as well. Vascular: Intracranial atherosclerosis.  No hyperdense vessel. Skull: Normal. Negative for fracture or focal lesion. Sinuses/Orbits: No acute finding. Other: None. IMPRESSION: Chronic brain atrophy and white matter microvascular ischemic changes. Evolutionary changes of multifocal subcortical infarcts bilaterally, largest in the high left frontal lobe when compared 06/04/2017 MRI. No acute intracranial hemorrhage. Electronically Signed   By: Jerilynn Mages.  Shick M.D.   On: 07/13/2018 13:07   Mr Brain Wo Contrast  Result Date: 07/13/2018 CLINICAL DATA:  Altered mental status, somnolent. History of stroke and RIGHT-sided weakness. History of seizures, pseudotumor cerebral with lumboperitoneal shunt, hypertension, hypercholesterolemia, diabetes. EXAM: MRI HEAD WITHOUT CONTRAST TECHNIQUE: Multiplanar, multiecho pulse sequences of the brain and surrounding structures were obtained without intravenous contrast. COMPARISON:  CT HEAD July 13, 2018 and MRI head June 04, 2017. FINDINGS: INTRACRANIAL CONTENTS: Multifocal reduced diffusion LEFT frontoparietal lobes with low ADC values. LEFT frontoparietal encephalomalacia with mild hemosiderin staining and chronic microhemorrhages. Old bilateral basal ganglia infarcts. Multifocal small areas RIGHT frontal encephalomalacia. Old small LEFT cerebellar infarct. Patchy supratentorial and pontine white matter FLAIR T2 hyperintensities. No midline shift, mass effect or masses. Mild parenchymal brain volume loss. No hydrocephalus. No abnormal extra-axial fluid collections. VASCULAR: Normal major intracranial vascular flow voids present at skull base. SKULL AND UPPER CERVICAL SPINE: No abnormal sellar expansion. No suspicious calvarial bone marrow signal. Craniocervical junction maintained. SINUSES/ORBITS: The mastoid air-cells and included paranasal sinuses are well-aerated.The included ocular globes and orbital contents are non-suspicious. Status  post bilateral ocular lens implants. OTHER: Patient is edentulous. IMPRESSION: 1. Acute multifocal LEFT frontoparietal/MCA territory infarcts 2. Old LEFT greater than RIGHT MCA territory infarcts. Old basal ganglia and cerebellar infarcts. 3. Moderate chronic small vessel ischemic changes. 4. Mild parenchymal brain volume loss. 5. Acute findings discussed with and reconfirmed by Dr.ELLIOTT WENTZ on 07/13/2018 at 5:24 pm. Electronically Signed   By: Elon Alas M.D.   On: 07/13/2018 17:26   CTA head and neck pending   PHYSICAL EXAM  Temp:  [97.5 F (36.4 C)-97.8 F (36.6 C)] 97.5 F (36.4 C) (09/26 0400) Pulse Rate:  [55-105] 105 (09/26 1231) Resp:  [10-18] 18 (09/26 1231) BP: (130-164)/(59-95) 156/86 (09/26 1231) SpO2:  [96 %-100 %] 97 % (09/26 1231)  General - Well nourished, well developed, lethargic.  Ophthalmologic - fundi not visualized due to noncooperation.  Cardiovascular - Regular rate and rhythm.  Neuro -awake, alert, lethargic.  Orientated to self and place, not orientated to time and age.  No aphasia, however paucity of speech, able to follow single commands.  Able to name and repeat simple sentences.  No hemianopia, visual field to full, PERRL, EOMI, left mild nasolabial fold flattening.  Tongue midline.  Bilateral upper extremity 4/5, with mild postural tremor, bilateral lower extremity 3/5 proximal and 4/5 distal.  DTR 1+, no Babinski.  Sensation symmetrical, bilateral upper extremity intact coordination, however slow on action.  Gait not tested.  ASSESSMENT/PLAN [name] is a 67 y.o. 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 No tPA given due to outside window.    Stroke:  left MCA and PCA patchy infarcts,  embolic likely secondary to left ICA bifurcation soft plaque rupture  Resultant lethargy, dysphagia  CT chronic bilateral patchy infarcts, left frontal new infarcts since last exam but not acute this time.  MRI left MCA and PCA patchy infarcts  CTA head neck bilateral ICA bifurcation calcified and soft plaques with left ICA stenosis and thrombus protruding into the lumen  2D Echo pending  LDLNTC due to TG 093  HgbA1c 6.3  SCDs for VTE prophylaxis  N.p.o. 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 Bridgett Larsson  This admission CTA head and neck again showed left ICA stenosis with significant calcified and soft plaque with thrombus protruding into the lumen  Currently on heparin IV  Vascular surgery consultation requested  History of strokes  05/2017 stroke due to right hand weakness, MRI showed left MCA patchy infarcts, CTA head and neck left ICA 60 to 65% stenosis.  EF 60/60%.  Carotid Doppler left ICA 40 to 59%.  Cerebral angiogram left ICA less than 50% by [facility].  LDL 211 and A1c 9.5.  05/2017 patient developed new symptoms after cerebral angiogram, CT abdomen showed bilateral M1 thrombosis, had thrombectomy status post bilateral MCA TICI3 reperfusion.  MRI showed bilateral inferior and superior infarcts indicating embolic source.  EEG negative.  Stroke considered angiogram procedure related.  She underwent trach and PEG and eventually  discharged to inpatient rehab with DAP[facility].  She has statin intolerance, recommend outpatient PCSK9 inhibitors.  She follows with Dr. Jaynee Eagles at [facility], was later on Plavix PTA.  Plan CT this admission showed possible interval left MCA infarcts  Diabetes  HgbA1c 6.3 goal < 7.0  Controlled  Currently on Lantus  CBG monitoring  SSI  DM education and close PCP follow up  Hypertension . Stable . Permissive hypertension (OK if <180/105) for 24-48 hours post stroke and then gradually normalized within 5-7 days.  Long term BP goal 130-150 given bilateral ICA stenosis  Hyperlipidemia  Home meds: None  LDL NTC due to TG 420, goal < 70  Consider Zetia once p.o. 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 This patient's care requiresreview of multiple databases, neurological assessment, discussion with family, other specialists and medical decision making of high complexity.  I have discussed with Dr. Joesph Fillers and vascular surgery PA.   Morgan Hawking, MD PhD Stroke Neurology 07/14/2018 1:08 PM    To contact Stroke Continuity provider, please refer to [email or web address] After hours, contact General Neurology

## 2018-07-14 NOTE — Progress Notes (Signed)
  Echocardiogram 2D Echocardiogram has been performed.  [name] 07/14/2018, 3:23 PM

## 2018-07-14 NOTE — Evaluation (Addendum)
Physical Therapy Evaluation Patient Details Name: [name] MRN: [id number] DOB: 02/11/1951 Today's Date: 07/14/2018   History of Present Illness  Patient is a 67 y/o female presenting to the ED on 07/13/18 with primary complaints of difficulty with speech. Patient with a PMH significant for DM 2, CVA, anemia, GERD, HLD, HTN seizures disorder.Pseudotumor cerebri, chronic refractory headaches, debility, sp PEG and Trach now removed. Initial CT head unremarkable. MRI revealing Acute multifocal LEFT frontoparietal/MCA territory infarcts.    Clinical Impression  [name] admitted with the above listed diagnosis. Patients husband present and providing most of PLOF. Reports that she was ambulatory in the home with his assist and required assist for ADLs. Patient today performing bed mobility and transfers at [place]. Able to stand at bedside for ~5 minutes for pericare and side step along bed for positioning. Patient moaning throughout session with difficulty expressing needs. PT to recommend CIR at discharge. Will continue to follow acutely.     Follow Up Recommendations CIR    Equipment Recommendations  None recommended by PT    Recommendations for Other Services Rehab consult;OT consult     Precautions / Restrictions Precautions Precautions: Fall Restrictions Weight Bearing Restrictions: No Other Position/Activity Restrictions: husband reporting a fall in the home recently - noted yellow/purple bruising to R foot      Mobility  Bed Mobility Overal bed mobility: Needs Assistance Bed Mobility: Supine to Sit;Sit to Supine     Supine to sit: Min assist Sit to supine: Min assist   General bed mobility comments: min A for LE and trunk management  Transfers Overall transfer level: Needs assistance Equipment used: Rolling walker (2 wheeled) Transfers: Sit to/from Stand Sit to Stand: Min assist         General transfer comment: Min A to power up at  bedside  Ambulation/Gait             General Gait Details: side stepping x 4 at bedside for positioning; patient declining any further mobility  Stairs            Wheelchair Mobility    Modified Rankin (Stroke Patients Only) Modified Rankin (Stroke Patients Only) Pre-Morbid Rankin Score: Slight disability Modified Rankin: Moderately severe disability     Balance Overall balance assessment: Needs assistance Sitting-balance support: Bilateral upper extremity supported;Feet supported Sitting balance-Leahy Scale: Fair     Standing balance support: Bilateral upper extremity supported;During functional activity Standing balance-Leahy Scale: Poor Standing balance comment: reliant on external support                             Pertinent Vitals/Pain Pain Assessment: Faces Faces Pain Scale: Hurts even more Pain Location: generalized Pain Descriptors / Indicators: Grimacing;Moaning Pain Intervention(s): Limited activity within patient's tolerance;Monitored during session    Home Living Family/patient expects to be discharged to:: Private residence Living Arrangements: Spouse/significant other Available Help at Discharge: Family;Available PRN/intermittently Type of Home: Mobile home Home Access: Stairs to enter Entrance Stairs-Rails: Can reach both Entrance Stairs-Number of Steps: 4 Home Layout: One level Home Equipment: Walker - 2 wheels;Bedside commode;Shower seat;Grab bars - tub/shower      Prior Function Level of Independence: Independent with assistive device(s)         Comments: husband would often act as AD     Hand Dominance        Extremity/Trunk Assessment   Upper Extremity Assessment Upper Extremity Assessment: Defer to OT evaluation  Lower Extremity Assessment Lower Extremity Assessment: Generalized weakness    Cervical / Trunk Assessment Cervical / Trunk Assessment: Kyphotic  Communication   Communication: No  difficulties;Other (comment)(speaks in a forced whisper)  Cognition Arousal/Alertness: Awake/alert Behavior During Therapy: WFL for tasks assessed/performed Overall Cognitive Status: Within Functional Limits for tasks assessed                                        General Comments      Exercises     Assessment/Plan    PT Assessment Patient needs continued PT services  PT Problem List Decreased strength;Decreased activity tolerance;Decreased balance;Decreased mobility;Decreased knowledge of use of DME;Decreased safety awareness       PT Treatment Interventions DME instruction;Gait training;Stair training;Therapeutic activities;Functional mobility training;Therapeutic exercise;Balance training;Neuromuscular re-education;Patient/family education    PT Goals (Current goals can be found in the Care Plan section)  Acute Rehab PT Goals Patient Stated Goal: none stated PT Goal Formulation: With patient Time For Goal Achievement: 07/28/18 Potential to Achieve Goals: Good    Frequency Min 4X/week   Barriers to discharge        Co-evaluation               AM-PAC PT "6 Clicks" Daily Activity  Outcome Measure Difficulty turning over in bed (including adjusting bedclothes, sheets and blankets)?: A Lot Difficulty moving from lying on back to sitting on the side of the bed? : Unable Difficulty sitting down on and standing up from a chair with arms (e.g., wheelchair, bedside commode, etc,.)?: Unable Help needed moving to and from a bed to chair (including a wheelchair)?: A Little Help needed walking in hospital room?: A Lot Help needed climbing 3-5 steps with a railing? : A Lot 6 Click Score: 11    End of Session Equipment Utilized During Treatment: Gait belt Activity Tolerance: Patient tolerated treatment well Patient left: in bed;with call bell/phone within reach;with nursing/sitter in room Nurse Communication: Mobility status PT Visit Diagnosis:  Unsteadiness on feet (R26.81);Other abnormalities of gait and mobility (R26.89);History of falling (Z91.81)    Time: 1400-1434 PT Time Calculation (min) (ACUTE ONLY): 34 min   Charges:   PT Evaluation $PT Eval Moderate Complexity: 1 Mod PT Treatments $Therapeutic Activity: 8-22 mins      Lanney Gins, PT, DPT Supplemental Physical Therapist 07/14/18 2:43 PM Pager: [phone] Office: [phone]

## 2018-07-14 NOTE — Progress Notes (Signed)
Patient Demographics:    [name], is a 67 y.o. female, DOB - 1951/06/25, KPT:[id number]  Admit date - 07/13/2018   Admitting Physician Toy Baker, MD  Outpatient Primary MD for the patient is [name], MD  LOS - 1   No chief complaint on file.       Subjective:    [facility] today has no fevers, no emesis,  No chest pain, husband at bedside resting comfortably  Assessment  & Plan :    Active Problems:   Acute CVA (cerebrovascular accident) ([place])   Type II diabetes mellitus with complication (HCC)   Benign essential HTN   Hypokalemia   CVA (cerebral vascular accident) ([place])   Diabetes mellitus ([place])  Brief summary  67 y.o. female with medical history significant of DM 2, CVA, anemia, GERD, HLD, HTN seizures disorder.Pseudotumor cerebri, chronic refractory headaches, debility, sp PEG and Trach now removed admitted on 07/13/2018 with unresponsive episode and found to have acute stroke,  left ICA bifurcation calcified plaque and soft plaque with thrombus protruding into the lumen    Plan:- 1)Acute CVA/Lt MCA Infarcts--CTA head and neck shows  left ICA bifurcation calcified plaque and soft plaque with thrombus protruding into the lumen , neurologist recommends IV heparin, vascular surgery consult for possible intervention, direct LDL 132, triglycerides over 420 , patient apparently is intolerant to statin drugs consider PCSK9 inhibitors as outpatient, consider Zetia once she passes swallow eval. plans for vascular intervention left ICA on Tuesday 07/20/18,   2)DM2-A1c 6.3, continue Lantus insulin, Use Novolog/Humalog Sliding scale insulin with Accu-Cheks/Fingersticks as ordered   3)Social/Ethics-plan of care  and CODE STATUS discussed with patient and husband at bedside, she is a full code  4) pseudotumor cerebri status post lumboperitoneal shunt-stable, history of prior  seizures due to shunt failure in 2007 by the admit stable lately  5)HTN-continue to allow permissive hypertension given bilateral ICA stenosis,  long-term BP goal is 170 to 017 systolic  Disposition/Need for in-Hospital Stay- patient unable to be discharged at this time due to IV heparin which is needed due to risk of imminent stroke in the setting of left ICA bifurcation calcified plaque and soft plaque with thrombus protruding into the lumen  Code Status : Full code   Disposition Plan  : TBD --- possibly CIR  Consults  : Neurology and vascular surgery   DVT Prophylaxis  : IV heparin Lab Results  Component Value Date   PLT 259 07/14/2018    Inpatient Medications  Scheduled Meds: . insulin aspart  0-9 Units Subcutaneous Q4H  . insulin glargine  15 Units Subcutaneous QHS   Continuous Infusions: . sodium chloride 75 mL/hr at 07/14/18 1335  . heparin 750 Units/hr (07/14/18 1332)   PRN Meds:.acetaminophen **OR** acetaminophen (TYLENOL) oral liquid 160 mg/5 mL **OR** acetaminophen    Anti-infectives (From admission, onward)   None        Objective:   Vitals:   07/14/18 0602 07/14/18 0735 07/14/18 1231 07/14/18 1926  BP: (!) 147/59 (!) 160/76 (!) 156/86 (!) 187/92  Pulse: 70 89 (!) 105 98  Resp:   18 18  Temp:  98.4 F (36.9 C)  98.5 F (36.9 C)  TempSrc:  Oral Oral Oral  SpO2: 98% 97%  97% 100%  Weight:      Height:        Wt Readings from Last 3 Encounters:  07/13/18 59 kg  05/18/18 61.7 kg  07/13/17 78 kg     Intake/Output Summary (Last 24 hours) at 07/14/2018 1932 Last data filed at 07/14/2018 0600 Gross per 24 hour  Intake 0 ml  Output 1450 ml  Net -1450 ml     Physical Exam Patient is examined daily including today on 07/14/18 , exams remain the same as of yesterday except that has changed   Gen:- Awake Alert, in no acute distress HEENT:- [place].AT, No sclera icterus Neck-Supple Neck,No JVD,.  Lungs-  CTAB , fair air movement CV- S1, S2  normal Abd-  +ve B.Sounds, Abd Soft, No tenderness,    Extremity/Skin:- No  edema,   good pulses Psych-affect is appropriate, oriented x3 Neuro-right-sided hemiparesis, RUE > RLE, no tremors   Data Review:   Micro Results No results found for this or any previous visit (from the past 240 hour(s)).  Radiology Reports Ct Angio Head W Or Wo Contrast  Result Date: 07/14/2018 CLINICAL DATA:  Recent infarcts. EXAM: CT ANGIOGRAPHY HEAD AND NECK TECHNIQUE: Multidetector CT imaging of the head and neck was performed using the standard protocol during bolus administration of intravenous contrast. Multiplanar CT image reconstructions and MIPs were obtained to evaluate the vascular anatomy. Carotid stenosis measurements (when applicable) are obtained utilizing NASCET criteria, using the distal internal carotid diameter as the denominator. CONTRAST:  24mL ISOVUE-370 IOPAMIDOL (ISOVUE-370) INJECTION 76% COMPARISON:  Brain MRI 07/13/2018 CTA head 06/23/2017 CTA neck and head 06/03/2017 FINDINGS: CTA NECK FINDINGS AORTIC ARCH: There is mild calcific atherosclerosis of the aortic arch. There is no aneurysm, dissection or hemodynamically significant stenosis of the visualized ascending aorta and aortic arch. Conventional 3 vessel aortic branching pattern. The visualized proximal subclavian arteries are widely patent. RIGHT CAROTID SYSTEM: --Common carotid artery: Widely patent origin without common carotid artery dissection or aneurysm. --Internal carotid artery: No dissection, occlusion or aneurysm. Mild atherosclerotic calcification at the carotid bifurcation without hemodynamically significant stenosis. --External carotid artery: No acute abnormality. LEFT CAROTID SYSTEM: --Common carotid artery: Widely patent origin without common carotid artery dissection or aneurysm. --Internal carotid artery:There is eccentric fibrofatty plaque within the proximal internal carotid artery that causes a stenosis of approximately  60% by NASCET criteria. The distal internal carotid artery is widely patent. --External carotid artery: No acute abnormality. VERTEBRAL ARTERIES: Left dominant configuration. Both origins are normal. No dissection, occlusion or flow-limiting stenosis to the vertebrobasilar confluence. SKELETON: There is no bony spinal canal stenosis. No lytic or blastic lesion. OTHER NECK: Normal pharynx, larynx and major salivary glands. No cervical lymphadenopathy. Unremarkable thyroid gland. UPPER CHEST: No pneumothorax or pleural effusion. No nodules or masses. CTA HEAD FINDINGS ANTERIOR CIRCULATION: --Intracranial internal carotid arteries: Atherosclerotic calcification of the internal carotid arteries at the skull base without hemodynamically significant stenosis. --Anterior cerebral arteries: Short segment high-grade stenosis versus occlusion of the proximal left A1 segment is unchanged. The anterior communicating artery is patent. Right A1 segment is normal. The A2 segments and distal branches are normal otherwise. The left MCA is normal. --Posterior communicating arteries: Present bilaterally. POSTERIOR CIRCULATION: --Basilar artery: Normal. --Posterior cerebral arteries: Normal. --Superior cerebellar arteries: Normal. --Inferior cerebellar arteries: Normal anterior and posterior inferior cerebellar arteries. VENOUS SINUSES: As permitted by contrast timing, patent. ANATOMIC VARIANTS: None DELAYED PHASE: No parenchymal contrast enhancement. Redemonstration of acute or subacute infarct in the left frontoparietal MCA territory. Review  of the MIP images confirms the above findings. IMPRESSION: 1. No emergent large vessel occlusion. 2. Approximately 60% stenosis of the left internal carotid artery by NASCET measurements. The eccentric appearance of the low density plaque within the lumen is unchanged. 3. Severe stenosis of the proximal left A1 segment, slightly worsened. 4. Mild narrowing of the distal portion of the right MCA  M1 segment. 5. Acute/subacute infarct of the left frontoparietal MCA territory. 6.  Aortic Atherosclerosis (ICD10-I70.0). Electronically Signed   By: Ulyses Jarred M.D.   On: 07/14/2018 14:08   Dg Chest 2 View  Result Date: 07/13/2018 CLINICAL DATA:  Generalized weakness.  Hypertension. EXAM: CHEST - 2 VIEW COMPARISON:  June 23, 2017 FINDINGS: There is mild scarring in the left base with questionable small left pleural effusion. Lungs elsewhere clear. Heart is mildly enlarged with pulmonary vascularity normal. No adenopathy. There is aortic atherosclerosis. No evident bone lesions. IMPRESSION: Mild scarring left base with questionable small left pleural effusion. Lungs elsewhere clear. Mild cardiac prominence. There is aortic atherosclerosis. Aortic Atherosclerosis (ICD10-I70.0). Electronically Signed   By: Lowella Grip III M.D.   On: 07/13/2018 13:13   Ct Head Wo Contrast  Result Date: 07/13/2018 CLINICAL DATA:  Altered mental status EXAM: CT HEAD WITHOUT CONTRAST TECHNIQUE: Contiguous axial images were obtained from the base of the skull through the vertex without intravenous contrast. COMPARISON:  06/23/2017, 06/04/2017 FINDINGS: Brain: Brain atrophy noted. Chronic white matter microvascular changes. Progressive subcortical white matter scattered hypodensities throughout both cerebral hemispheres, worse in the left frontal lobe compatible with evolutionary changes of previous multifocal infarcts from 06/04/2017. No acute intracranial hemorrhage, mass lesion, definite new infarction, midline shift, herniation, hydrocephalus, or extra-axial fluid collection. No focal mass effect or edema. Cisterns are patent. Cerebellar atrophy as well. Vascular: Intracranial atherosclerosis.  No hyperdense vessel. Skull: Normal. Negative for fracture or focal lesion. Sinuses/Orbits: No acute finding. Other: None. IMPRESSION: Chronic brain atrophy and white matter microvascular ischemic changes. Evolutionary  changes of multifocal subcortical infarcts bilaterally, largest in the high left frontal lobe when compared 06/04/2017 MRI. No acute intracranial hemorrhage. Electronically Signed   By: Jerilynn Mages.  Shick M.D.   On: 07/13/2018 13:07   Ct Angio Neck W Or Wo Contrast  Result Date: 07/14/2018 CLINICAL DATA:  Recent infarcts. EXAM: CT ANGIOGRAPHY HEAD AND NECK TECHNIQUE: Multidetector CT imaging of the head and neck was performed using the standard protocol during bolus administration of intravenous contrast. Multiplanar CT image reconstructions and MIPs were obtained to evaluate the vascular anatomy. Carotid stenosis measurements (when applicable) are obtained utilizing NASCET criteria, using the distal internal carotid diameter as the denominator. CONTRAST:  10mL ISOVUE-370 IOPAMIDOL (ISOVUE-370) INJECTION 76% COMPARISON:  Brain MRI 07/13/2018 CTA head 06/23/2017 CTA neck and head 06/03/2017 FINDINGS: CTA NECK FINDINGS AORTIC ARCH: There is mild calcific atherosclerosis of the aortic arch. There is no aneurysm, dissection or hemodynamically significant stenosis of the visualized ascending aorta and aortic arch. Conventional 3 vessel aortic branching pattern. The visualized proximal subclavian arteries are widely patent. RIGHT CAROTID SYSTEM: --Common carotid artery: Widely patent origin without common carotid artery dissection or aneurysm. --Internal carotid artery: No dissection, occlusion or aneurysm. Mild atherosclerotic calcification at the carotid bifurcation without hemodynamically significant stenosis. --External carotid artery: No acute abnormality. LEFT CAROTID SYSTEM: --Common carotid artery: Widely patent origin without common carotid artery dissection or aneurysm. --Internal carotid artery:There is eccentric fibrofatty plaque within the proximal internal carotid artery that causes a stenosis of approximately 60% by NASCET criteria. The distal internal  carotid artery is widely patent. --External carotid artery:  No acute abnormality. VERTEBRAL ARTERIES: Left dominant configuration. Both origins are normal. No dissection, occlusion or flow-limiting stenosis to the vertebrobasilar confluence. SKELETON: There is no bony spinal canal stenosis. No lytic or blastic lesion. OTHER NECK: Normal pharynx, larynx and major salivary glands. No cervical lymphadenopathy. Unremarkable thyroid gland. UPPER CHEST: No pneumothorax or pleural effusion. No nodules or masses. CTA HEAD FINDINGS ANTERIOR CIRCULATION: --Intracranial internal carotid arteries: Atherosclerotic calcification of the internal carotid arteries at the skull base without hemodynamically significant stenosis. --Anterior cerebral arteries: Short segment high-grade stenosis versus occlusion of the proximal left A1 segment is unchanged. The anterior communicating artery is patent. Right A1 segment is normal. The A2 segments and distal branches are normal otherwise. The left MCA is normal. --Posterior communicating arteries: Present bilaterally. POSTERIOR CIRCULATION: --Basilar artery: Normal. --Posterior cerebral arteries: Normal. --Superior cerebellar arteries: Normal. --Inferior cerebellar arteries: Normal anterior and posterior inferior cerebellar arteries. VENOUS SINUSES: As permitted by contrast timing, patent. ANATOMIC VARIANTS: None DELAYED PHASE: No parenchymal contrast enhancement. Redemonstration of acute or subacute infarct in the left frontoparietal MCA territory. Review of the MIP images confirms the above findings. IMPRESSION: 1. No emergent large vessel occlusion. 2. Approximately 60% stenosis of the left internal carotid artery by NASCET measurements. The eccentric appearance of the low density plaque within the lumen is unchanged. 3. Severe stenosis of the proximal left A1 segment, slightly worsened. 4. Mild narrowing of the distal portion of the right MCA M1 segment. 5. Acute/subacute infarct of the left frontoparietal MCA territory. 6.  Aortic  Atherosclerosis (ICD10-I70.0). Electronically Signed   By: Ulyses Jarred M.D.   On: 07/14/2018 14:08   Mr Brain Wo Contrast  Result Date: 07/13/2018 CLINICAL DATA:  Altered mental status, somnolent. History of stroke and RIGHT-sided weakness. History of seizures, pseudotumor cerebral with lumboperitoneal shunt, hypertension, hypercholesterolemia, diabetes. EXAM: MRI HEAD WITHOUT CONTRAST TECHNIQUE: Multiplanar, multiecho pulse sequences of the brain and surrounding structures were obtained without intravenous contrast. COMPARISON:  CT HEAD July 13, 2018 and MRI head June 04, 2017. FINDINGS: INTRACRANIAL CONTENTS: Multifocal reduced diffusion LEFT frontoparietal lobes with low ADC values. LEFT frontoparietal encephalomalacia with mild hemosiderin staining and chronic microhemorrhages. Old bilateral basal ganglia infarcts. Multifocal small areas RIGHT frontal encephalomalacia. Old small LEFT cerebellar infarct. Patchy supratentorial and pontine white matter FLAIR T2 hyperintensities. No midline shift, mass effect or masses. Mild parenchymal brain volume loss. No hydrocephalus. No abnormal extra-axial fluid collections. VASCULAR: Normal major intracranial vascular flow voids present at skull base. SKULL AND UPPER CERVICAL SPINE: No abnormal sellar expansion. No suspicious calvarial bone marrow signal. Craniocervical junction maintained. SINUSES/ORBITS: The mastoid air-cells and included paranasal sinuses are well-aerated.The included ocular globes and orbital contents are non-suspicious. Status post bilateral ocular lens implants. OTHER: Patient is edentulous. IMPRESSION: 1. Acute multifocal LEFT frontoparietal/MCA territory infarcts 2. Old LEFT greater than RIGHT MCA territory infarcts. Old basal ganglia and cerebellar infarcts. 3. Moderate chronic small vessel ischemic changes. 4. Mild parenchymal brain volume loss. 5. Acute findings discussed with and reconfirmed by Dr.ELLIOTT WENTZ on 07/13/2018 at 5:24  pm. Electronically Signed   By: Elon Alas M.D.   On: 07/13/2018 17:26     CBC Recent Labs  Lab 07/13/18 1109 07/13/18 1125 07/14/18 0805  WBC 10.8*  --  8.3  HGB 13.8 15.0 12.4  HCT 44.7 44.0 39.3  PLT 324  --  259  MCV 88.5  --  86.8  MCH 27.3  --  27.4  MCHC 30.9  --  31.6  RDW 14.6  --  14.6  LYMPHSABS 2.3  --   --   MONOABS 0.7  --   --   EOSABS 0.2  --   --   BASOSABS 0.1  --   --     Chemistries  Recent Labs  Lab 07/13/18 1109 07/13/18 1125 07/13/18 2353 07/14/18 0805  NA 138 138  --  141  K 3.2* 3.5  --  3.6  CL 100 103  --  111  CO2 24  --   --  21*  GLUCOSE 130* 128*  --  95  BUN 38* 40*  --  16  CREATININE 1.27* 1.40*  --  0.88  CALCIUM 9.9  --   --  9.0  MG  --   --  1.7  --   AST 27  --   --   --   ALT 23  --   --   --   ALKPHOS 121  --   --   --   BILITOT 0.4  --   --   --    ------------------------------------------------------------------------------------------------------------------ Recent Labs    07/14/18 0007  CHOL 258*  HDL 32*  LDLCALC UNABLE TO CALCULATE IF TRIGLYCERIDE OVER 400 mg/dL  TRIG 420*  CHOLHDL 8.1    Lab Results  Component Value Date   HGBA1C 6.3 (H) 07/13/2018   ------------------------------------------------------------------------------------------------------------------ No results for input(s): TSH, T4TOTAL, T3FREE, THYROIDAB in the last 72 hours.  Invalid input(s): FREET3 ------------------------------------------------------------------------------------------------------------------ No results for input(s): VITAMINB12, FOLATE, FERRITIN, TIBC, IRON, RETICCTPCT in the last 72 hours.  Coagulation profile Recent Labs  Lab 07/13/18 1109  INR 1.00    No results for input(s): DDIMER in the last 72 hours.  Cardiac Enzymes No results for input(s): CKMB, TROPONINI, MYOGLOBIN in the last 168 hours.  Invalid input(s):  CK ------------------------------------------------------------------------------------------------------------------ No results found for: BNP   Roxan Hockey M.D on 07/14/2018 at 7:32 PM  Pager---[phone] Go to [email or web address] - [email or web address] for contact info  [facility] - Office  [phone]

## 2018-07-14 NOTE — Consult Note (Signed)
Physical Medicine and Rehabilitation Consult   Reason for Consult: Functional deficits due to stroke.  Referring Physician: Dr. Denton Brick   HPI: [name] is a 67 y.o. female with history of seizures, prior strokes--CIR stay 05/2018,  pseudotumor cerebri with lumboperitoneal shunt, HTN, headaches, T2DM with peripheral neuropathy, who was admitted on 07/13/2017 with weakness and inability to talk.  MRI brain showed acute multifocal left frontoparietal/MCA infarcts and old left greater than right MCA territory infarcts.  CTA head/neck showed fibrofatty plaque within the proximal left  ICA causing 60% stenosis 60% stenosis and severe stenosis proximal left A1.  She was started on IV heparin and vascular consulted for input.  Dr. Erlinda Hong felt that stroke embolic due to plaque rupture. Bedside swallow showed signs and symptoms of aspiration with concerns of acute on chronic dysphagia and NPO recommended. Patient with aphasia, lethargy and weakness. Work up underway with plans for surgery in next few days. CIR recommended due to functional deficits.   According to husband patient was ambulating with a walker at home occasionally with physical assist however she was home while he was at work as well.  She did fall off the toilet and sprained her right ankle about 2 weeks prior to admission. Patient had previous PEG placed on 06/25/2017 but this was removed in 2018 after swallowing improved.  The patient was eating normal foods such as sandwiches just prior to this admission.  Patient's husband states that carotid endarterectomy is planned this Monday  Review of Systems  Constitutional: Negative for chills and fever.  HENT: Negative for hearing loss and tinnitus.   Eyes: Positive for blurred vision. Negative for double vision.  Respiratory: Negative for cough and shortness of breath.   Cardiovascular: Negative for chest pain, palpitations and leg swelling.  Gastrointestinal: Negative for abdominal  pain.  Genitourinary: Positive for urgency.  Musculoskeletal: Positive for falls (6-8 weeks ago). Negative for myalgias.  Skin: Negative for rash.  Neurological: Positive for sensory change, speech change, focal weakness and headaches.  Psychiatric/Behavioral: The patient is nervous/anxious.       Past Medical History:  Diagnosis Date  . Anemia    takes iron supplement  . Anesthesia complication    disorientation due to pseudotumor  . Diabetes mellitus    IDDM  . Family history of anesthesia complication 15 yrs ago   brother stopped breathing for a minute or two  . GERD (gastroesophageal reflux disease)   . Headache(784.0)    due to pseudotumor; daily headache  . Hypercholesteremia    unable to tolerate statins  . Hypertension    under control with meds., has been on med. x 5 yr.  . Peripheral neuropathy   . PONV (postoperative nausea and vomiting)   . 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 Seizures ([place])    due to shunt failure; last seizure 2007  . Synovitis of ankle 04/2013   left  . Wears dentures    full    Past Surgical History:  Procedure Laterality Date  . ANKLE ARTHROSCOPY Left 05/18/2013   Procedure: LEFT ANKLE ARTHROSCOPY WITH DEBRIDEMENT,  SUBTALAR OPEN DEBRIDEMENT ;  Surgeon: Wylene Simmer, MD;  Location: [facility];  Service: Orthopedics;  Laterality: Left;  . APPENDECTOMY     as a child  . BALLOON DILATION N/A 07/04/2013   Procedure: BALLOON DILATION;  Surgeon: Garlan Fair, MD;  Location: [facility];  Service: Endoscopy;  Laterality: N/A;  .  BLADDER SUSPENSION    . BREAST LUMPECTOMY W/ NEEDLE LOCALIZATION Left 05/22/2011  . CAROTID ANGIOGRAPHY Bilateral 06/03/2017   Procedure: Bilateral Carotid Angiography;  Surgeon: Conrad Decatur, MD;  Location: [facility];  Service: Cardiovascular;  Laterality: Bilateral;  . COLONOSCOPY WITH PROPOFOL N/A 05/14/2014   Procedure: COLONOSCOPY WITH PROPOFOL;  Surgeon: Garlan Fair, MD;  Location: [facility];  Service: Endoscopy;  Laterality: N/A;  . ESOPHAGEAL DILATION  06/23/2006; 08/05/2004  . ESOPHAGOGASTRODUODENOSCOPY N/A 07/04/2013   Procedure: ESOPHAGOGASTRODUODENOSCOPY (EGD);  Surgeon: Garlan Fair, MD;  Location: [facility];  Service: Endoscopy;  Laterality: N/A;  . ESOPHAGOGASTRODUODENOSCOPY (EGD) WITH PROPOFOL N/A 05/14/2014   Procedure: ESOPHAGOGASTRODUODENOSCOPY (EGD) WITH PROPOFOL;  Surgeon: Garlan Fair, MD;  Location: [facility];  Service: Endoscopy;  Laterality: N/A;  . IR ANGIO VERTEBRAL SEL SUBCLAVIAN INNOMINATE UNI R MOD SED  06/03/2017  . IR GASTROSTOMY TUBE MOD SED  06/25/2017  . IR GASTROSTOMY TUBE REMOVAL  08/20/2017  . IR PERCUTANEOUS ART THROMBECTOMY/INFUSION INTRACRANIAL INC DIAG ANGIO  06/03/2017  . IR PERCUTANEOUS ART THROMBECTOMY/INFUSION INTRACRANIAL INC DIAG ANGIO  06/03/2017  . IR RADIOLOGIST EVAL & MGMT  09/07/2017  . LUMBAR PERITONEAL SHUNT     x 2  . OVARIAN CYST REMOVAL     age 68  . RADIOLOGY WITH ANESTHESIA N/A 06/03/2017   Procedure: RADIOLOGY WITH ANESTHESIA;  Surgeon: Luanne Bras, MD;  Location: [facility];  Service: Radiology;  Laterality: N/A;  . SHUNT REVISION  2007  . TOTAL ABDOMINAL HYSTERECTOMY W/ BILATERAL SALPINGOOPHORECTOMY  1994   age 39s  . WRIST SURGERY Left 2014    [name]    Family History  Problem Relation Age of Onset  . Heart disease Father   . Migraines Neg Hx     Social History:  Married. Needed assistance with ADLs and  reports that she has never smoked. She has never used smokeless tobacco. She reports that she drinks couple of ounces of beer on occasion. She reports that she does not use drugs.    Allergies  Allergen Reactions  . Carbamazepine Hives, Shortness Of Breath and Other (See Comments)  . Penicillins Anaphylaxis, Other (See Comments) and Swelling    Mother, father and brother have history of anaphylaxis reaction to penicillin so pt does not take  My throat  swells Familial history of anaphylaxis   . Statins Other (See Comments)    Severe muscle weakness, leg numbness, severe headaches, chest pain   . Tricyclic Antidepressants Other (See Comments)    IMPAIRED MEMORY  . Atorvastatin Other (See Comments)    Severe muscle weakness, leg numbness, severe headaches, chest pain   . Ezetimibe Other (See Comments)    Severe muscle weakness, leg numbness, severe headaches, chest pain   . Nortriptyline Other (See Comments)    IMPAIRED MEMORY  . Quinapril Hcl Other (See Comments)    Unknown reaction  . Rifampin Diarrhea  . Metoclopramide Nausea And Vomiting and Rash    Medications Prior to Admission  Medication Sig Dispense Refill  . busPIRone (BUSPAR) 5 MG tablet Take 5 mg by mouth 3 (three) times daily.  5  . CARTIA XT 180 MG 24 hr capsule Take 180 mg by mouth daily.  8  . cloNIDine (CATAPRES) 0.1 MG tablet Take 0.1 mg by mouth 2 (two) times daily.  11  . clopidogrel (PLAVIX) 75 MG tablet Take 1 tablet (75 mg total) by mouth daily with breakfast. 30 tablet 0  . diclofenac sodium (  VOLTAREN) 1 % GEL Apply 2 application topically 4 (four) times daily as needed (pain).     Marland Kitchen esomeprazole (NEXIUM) 40 MG capsule Take 40 mg by mouth daily as needed (acid reflux). Acid reflux    . insulin glargine (LANTUS) 100 unit/mL SOPN Inject 0.2 mLs (20 Units total) into the skin at bedtime. (Patient taking differently: Inject 22 Units into the skin at bedtime. ) 15 mL 11  . metFORMIN (GLUMETZA) 500 MG (MOD) 24 hr tablet Take 500 mg by mouth daily.     . metoprolol succinate (TOPROL-XL) 100 MG 24 hr tablet Take 100 mg by mouth 2 (two) times daily.     Marland Kitchen PRESCRIPTION MEDICATION Take 20 mg by mouth See admin instructions. Domperidone 10 mg from [place]: Take 2 tablets (20 mg) by mouth three times daily with meals - for diabetic gastroparesis    . prochlorperazine (COMPAZINE) 5 MG tablet Take 1 tablet (5 mg total) by mouth every 8 (eight) hours as needed for nausea or  vomiting. Have been taking it with meals--wean as able (Patient taking differently: Take 5 mg by mouth every 8 (eight) hours as needed for nausea or vomiting. ) 90 tablet 0  . ranitidine (ZANTAC) 75 MG tablet Take 75-150 mg by mouth daily.   2  . sertraline (ZOLOFT) 50 MG tablet Take 1 tablet (50 mg total) by mouth daily. 30 tablet 11  . tiZANidine (ZANAFLEX) 4 MG tablet Take 4 mg by mouth 2 (two) times daily as needed.  5  . topiramate (TOPAMAX) 50 MG tablet Take 1 tablet (50 mg total) by mouth 2 (two) times daily. 60 tablet 0  . traMADol (ULTRAM) 50 MG tablet Take 50 mg by mouth 3 (three) times daily as needed for pain.  0  . VICTOZA 18 MG/3ML SOPN Inject 1.2 mg into the skin at bedtime.   11  . ACCU-CHEK COMPACT PLUS test strip   11  . acetaZOLAMIDE (DIAMOX) 250 MG tablet Take 1 tablet (250 mg total) by mouth 3 (three) times daily. 90 tablet 12    Home: Home Living Family/patient expects to be discharged to:: Private residence Living Arrangements: Spouse/significant other Available Help at Discharge: Family, Available PRN/intermittently Type of Home: Mobile home Home Access: Stairs to enter Technical brewer of Steps: 4 Entrance Stairs-Rails: Can reach both Home Layout: One level Bathroom Shower/Tub: Chiropodist: Standard Home Equipment: Environmental consultant - 2 wheels, Bedside commode, Shower seat, Grab bars - tub/shower  Functional History: Prior Function Level of Independence: Independent with assistive device(s) Comments: husband would often act as AD Functional Status:  Mobility: Bed Mobility Overal bed mobility: Needs Assistance Bed Mobility: Supine to Sit, Sit to Supine Supine to sit: Min assist Sit to supine: Min assist General bed mobility comments: min A for LE and trunk management Transfers Overall transfer level: Needs assistance Equipment used: Rolling walker (2 wheeled) Transfers: Sit to/from Stand Sit to Stand: Min assist General transfer comment:  Min A to power up at bedside Ambulation/Gait General Gait Details: side stepping x 4 at bedside for positioning; patient declining any further mobility    ADL:    Cognition: Cognition Overall Cognitive Status: Within Functional Limits for tasks assessed Orientation Level: Oriented to person, Oriented to place, Disoriented to time, Disoriented to situation Cognition Arousal/Alertness: Awake/alert Behavior During Therapy: WFL for tasks assessed/performed Overall Cognitive Status: Within Functional Limits for tasks assessed   Blood pressure (!) 156/86, pulse (!) 105, temperature (!) 97.5 F (36.4 C), temperature source Oral,  resp. rate 18, height 5\' 2"  (1.575 m), weight 59 kg, SpO2 97 %. Physical Exam  Nursing note and vitals reviewed. Constitutional: She is oriented to person, place, and time. She appears well-developed and well-nourished. No distress.  Teary and anxious appearing.   HENT:  Head: Normocephalic and atraumatic.  Edentulous   Eyes: Pupils are equal, round, and reactive to light. Conjunctivae and EOM are normal.  Neck: Normal range of motion.  Cardiovascular: Normal rate, regular rhythm and normal heart sounds. Exam reveals no friction rub.  No murmur heard. Respiratory: Effort normal and breath sounds normal. No respiratory distress. She has no wheezes.  GI: Soft. Bowel sounds are normal. She exhibits no distension. There is no tenderness.  Musculoskeletal:  Right foot with forefoot ecchymosis and tender to touch.  Also has lateral ankle ecchymosis on the right side pain with inversion of the foot as well as palpation over the lateral malleolus.  Neurological: She is alert and oriented to person, place, and time. Gait abnormal.  Left facial paresis with ataxic speech. Right spastic hemiparesis.  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 Right lower limb to minus in the hip flexion knee extension ankle dorsiflexion. 4/5 in the left deltoid bicep tricep grip hip flexor knee extensor ankle dorsiflexor Sensation is reduced to light touch in the right upper limb and right lower limb Speech has mild dysarthria, sparse verbal output but is able to speak at a phrase level   Skin: Skin is warm and dry. She is not diaphoretic.  Psychiatric: She has a normal mood and affect.    Results for orders placed or performed during the hospital encounter of 07/13/18 (from the past 24 hour(s))  Glucose, capillary     Status: Abnormal   Collection Time: 07/13/18 11:37 PM  Result Value Ref Range   Glucose-Capillary 113 (H) 70 - 99 mg/dL   Comment 1 Notify RN    Comment 2 Document in Chart   Hemoglobin A1c     Status: Abnormal   Collection Time: 07/13/18 11:53 PM  Result Value Ref Range   Hgb A1c MFr Bld 6.3 (H) 4.8 - 5.6 %   Mean Plasma Glucose 134.11 mg/dL  Magnesium     Status: None   Collection Time: 07/13/18 11:53 PM  Result Value Ref Range   Magnesium 1.7 1.7 - 2.4 mg/dL  Phosphorus     Status: None   Collection Time: 07/13/18 11:53 PM  Result Value Ref Range   Phosphorus 2.9 2.5 - 4.6 mg/dL  Prealbumin     Status: None   Collection Time: 07/14/18 12:07 AM  Result Value Ref Range   Prealbumin 24.9 18 - 38 mg/dL  HIV antibody (Routine Testing)     Status: None   Collection Time: 07/14/18 12:07 AM  Result Value Ref Range   HIV Screen 4th Generation wRfx Non Reactive Non Reactive  Lipid panel     Status: Abnormal   Collection Time: 07/14/18 12:07 AM  Result Value Ref Range   Cholesterol 258 (H) 0 - 200 mg/dL   Triglycerides 420 (H) <150 mg/dL   HDL 32 (L) >40 mg/dL   Total CHOL/HDL Ratio 8.1 RATIO   VLDL UNABLE TO CALCULATE IF TRIGLYCERIDE OVER 400 mg/dL 0 - 40 mg/dL   LDL Cholesterol UNABLE TO CALCULATE IF TRIGLYCERIDE OVER 400 mg/dL 0 - 99 mg/dL  Glucose, capillary     Status: Abnormal   Collection Time:  07/14/18  3:26 AM  Result Value Ref Range   Glucose-Capillary 131 (H) 70 - 99 mg/dL   Comment 1 Notify RN    Comment 2 Document in Chart   Glucose, capillary     Status: None   Collection Time: 07/14/18  7:37 AM  Result Value Ref Range   Glucose-Capillary 85 70 - 99 mg/dL  CBC     Status: None   Collection Time: 07/14/18  8:05 AM  Result Value Ref Range   WBC 8.3 4.0 - 10.5 K/uL   RBC 4.53 3.87 - 5.11 MIL/uL   Hemoglobin 12.4 12.0 - 15.0 g/dL   HCT 39.3 36.0 - 46.0 %   MCV 86.8 78.0 - 100.0 fL   MCH 27.4 26.0 - 34.0 pg   MCHC 31.6 30.0 - 36.0 g/dL   RDW 14.6 11.5 - 15.5 %   Platelets 259 150 - 400 K/uL  Basic metabolic panel     Status: Abnormal   Collection Time: 07/14/18  8:05 AM  Result Value Ref Range   Sodium 141 135 - 145 mmol/L   Potassium 3.6 3.5 - 5.1 mmol/L   Chloride 111 98 - 111 mmol/L   CO2 21 (L) 22 - 32 mmol/L   Glucose, Bld 95 70 - 99 mg/dL   BUN 16 8 - 23 mg/dL   Creatinine, Ser 0.88 0.44 - 1.00 mg/dL   Calcium 9.0 8.9 - 10.3 mg/dL   GFR calc non Af Amer >60 >60 mL/min   GFR calc Af Amer >60 >60 mL/min   Anion gap 9 5 - 15  Platelet inhibition p2y12 (Not at [facility])     Status: None   Collection Time: 07/14/18  8:05 AM  Result Value Ref Range   Platelet Function  P2Y12 258 194 - 418 PRU  Glucose, capillary     Status: Abnormal   Collection Time: 07/14/18 11:49 AM  Result Value Ref Range   Glucose-Capillary 100 (H) 70 - 99 mg/dL   Ct Angio Head W Or Wo Contrast  Result Date: 07/14/2018 CLINICAL DATA:  Recent infarcts. EXAM: CT ANGIOGRAPHY HEAD AND NECK TECHNIQUE: Multidetector CT imaging of the head and neck was performed using the standard protocol during bolus administration of intravenous contrast. Multiplanar CT image reconstructions and MIPs were obtained to evaluate the vascular anatomy. Carotid stenosis measurements (when applicable) are obtained utilizing NASCET criteria, using the distal internal carotid diameter as the denominator. CONTRAST:  9mL  ISOVUE-370 IOPAMIDOL (ISOVUE-370) INJECTION 76% COMPARISON:  Brain MRI 07/13/2018 CTA head 06/23/2017 CTA neck and head 06/03/2017 FINDINGS: CTA NECK FINDINGS AORTIC ARCH: There is mild calcific atherosclerosis of the aortic arch. There is no aneurysm, dissection or hemodynamically significant stenosis of the visualized ascending aorta and aortic arch. Conventional 3 vessel aortic branching pattern. The visualized proximal subclavian arteries are widely patent. RIGHT CAROTID SYSTEM: --Common carotid artery: Widely patent origin without common carotid artery dissection or aneurysm. --Internal carotid artery: No dissection, occlusion or aneurysm. Mild atherosclerotic calcification at the carotid bifurcation without hemodynamically significant stenosis. --External carotid artery: No acute abnormality. LEFT CAROTID SYSTEM: --Common carotid artery: Widely patent origin without common carotid artery dissection or aneurysm. --Internal carotid artery:There is eccentric fibrofatty plaque within the proximal internal carotid artery that causes a stenosis of approximately 60% by NASCET criteria. The distal internal carotid artery is widely patent. --External carotid artery: No acute abnormality. VERTEBRAL ARTERIES: Left dominant configuration. Both origins are  normal. No dissection, occlusion or flow-limiting stenosis to the vertebrobasilar confluence. SKELETON: There is no bony spinal canal stenosis. No lytic or blastic lesion. OTHER NECK: Normal pharynx, larynx and major salivary glands. No cervical lymphadenopathy. Unremarkable thyroid gland. UPPER CHEST: No pneumothorax or pleural effusion. No nodules or masses. CTA HEAD FINDINGS ANTERIOR CIRCULATION: --Intracranial internal carotid arteries: Atherosclerotic calcification of the internal carotid arteries at the skull base without hemodynamically significant stenosis. --Anterior cerebral arteries: Short segment high-grade stenosis versus occlusion of the proximal left A1  segment is unchanged. The anterior communicating artery is patent. Right A1 segment is normal. The A2 segments and distal branches are normal otherwise. The left MCA is normal. --Posterior communicating arteries: Present bilaterally. POSTERIOR CIRCULATION: --Basilar artery: Normal. --Posterior cerebral arteries: Normal. --Superior cerebellar arteries: Normal. --Inferior cerebellar arteries: Normal anterior and posterior inferior cerebellar arteries. VENOUS SINUSES: As permitted by contrast timing, patent. ANATOMIC VARIANTS: None DELAYED PHASE: No parenchymal contrast enhancement. Redemonstration of acute or subacute infarct in the left frontoparietal MCA territory. Review of the MIP images confirms the above findings. IMPRESSION: 1. No emergent large vessel occlusion. 2. Approximately 60% stenosis of the left internal carotid artery by NASCET measurements. The eccentric appearance of the low density plaque within the lumen is unchanged. 3. Severe stenosis of the proximal left A1 segment, slightly worsened. 4. Mild narrowing of the distal portion of the right MCA M1 segment. 5. Acute/subacute infarct of the left frontoparietal MCA territory. 6.  Aortic Atherosclerosis (ICD10-I70.0). Electronically Signed   By: Ulyses Jarred M.D.   On: 07/14/2018 14:08   Dg Chest 2 View  Result Date: 07/13/2018 CLINICAL DATA:  Generalized weakness.  Hypertension. EXAM: CHEST - 2 VIEW COMPARISON:  June 23, 2017 FINDINGS: There is mild scarring in the left base with questionable small left pleural effusion. Lungs elsewhere clear. Heart is mildly enlarged with pulmonary vascularity normal. No adenopathy. There is aortic atherosclerosis. No evident bone lesions. IMPRESSION: Mild scarring left base with questionable small left pleural effusion. Lungs elsewhere clear. Mild cardiac prominence. There is aortic atherosclerosis. Aortic Atherosclerosis (ICD10-I70.0). Electronically Signed   By: Lowella Grip III M.D.   On: 07/13/2018  13:13   Ct Head Wo Contrast  Result Date: 07/13/2018 CLINICAL DATA:  Altered mental status EXAM: CT HEAD WITHOUT CONTRAST TECHNIQUE: Contiguous axial images were obtained from the base of the skull through the vertex without intravenous contrast. COMPARISON:  06/23/2017, 06/04/2017 FINDINGS: Brain: Brain atrophy noted. Chronic white matter microvascular changes. Progressive subcortical white matter scattered hypodensities throughout both cerebral hemispheres, worse in the left frontal lobe compatible with evolutionary changes of previous multifocal infarcts from 06/04/2017. No acute intracranial hemorrhage, mass lesion, definite new infarction, midline shift, herniation, hydrocephalus, or extra-axial fluid collection. No focal mass effect or edema. Cisterns are patent. Cerebellar atrophy as well. Vascular: Intracranial atherosclerosis.  No hyperdense vessel. Skull: Normal. Negative for fracture or focal lesion. Sinuses/Orbits: No acute finding. Other: None. IMPRESSION: Chronic brain atrophy and white matter microvascular ischemic changes. Evolutionary changes of multifocal subcortical infarcts bilaterally, largest in the high left frontal lobe when compared 06/04/2017 MRI. No acute intracranial hemorrhage. Electronically Signed   By: Jerilynn Mages.  Shick M.D.   On: 07/13/2018 13:07   Ct Angio Neck W Or Wo Contrast  Result Date: 07/14/2018 CLINICAL DATA:  Recent infarcts. EXAM: CT ANGIOGRAPHY HEAD AND NECK TECHNIQUE: Multidetector CT imaging of the head and neck was performed using the standard protocol during bolus administration of intravenous contrast. Multiplanar CT image reconstructions and MIPs were obtained  to evaluate the vascular anatomy. Carotid stenosis measurements (when applicable) are obtained utilizing NASCET criteria, using the distal internal carotid diameter as the denominator. CONTRAST:  24mL ISOVUE-370 IOPAMIDOL (ISOVUE-370) INJECTION 76% COMPARISON:  Brain MRI 07/13/2018 CTA head 06/23/2017 CTA neck  and head 06/03/2017 FINDINGS: CTA NECK FINDINGS AORTIC ARCH: There is mild calcific atherosclerosis of the aortic arch. There is no aneurysm, dissection or hemodynamically significant stenosis of the visualized ascending aorta and aortic arch. Conventional 3 vessel aortic branching pattern. The visualized proximal subclavian arteries are widely patent. RIGHT CAROTID SYSTEM: --Common carotid artery: Widely patent origin without common carotid artery dissection or aneurysm. --Internal carotid artery: No dissection, occlusion or aneurysm. Mild atherosclerotic calcification at the carotid bifurcation without hemodynamically significant stenosis. --External carotid artery: No acute abnormality. LEFT CAROTID SYSTEM: --Common carotid artery: Widely patent origin without common carotid artery dissection or aneurysm. --Internal carotid artery:There is eccentric fibrofatty plaque within the proximal internal carotid artery that causes a stenosis of approximately 60% by NASCET criteria. The distal internal carotid artery is widely patent. --External carotid artery: No acute abnormality. VERTEBRAL ARTERIES: Left dominant configuration. Both origins are normal. No dissection, occlusion or flow-limiting stenosis to the vertebrobasilar confluence. SKELETON: There is no bony spinal canal stenosis. No lytic or blastic lesion. OTHER NECK: Normal pharynx, larynx and major salivary glands. No cervical lymphadenopathy. Unremarkable thyroid gland. UPPER CHEST: No pneumothorax or pleural effusion. No nodules or masses. CTA HEAD FINDINGS ANTERIOR CIRCULATION: --Intracranial internal carotid arteries: Atherosclerotic calcification of the internal carotid arteries at the skull base without hemodynamically significant stenosis. --Anterior cerebral arteries: Short segment high-grade stenosis versus occlusion of the proximal left A1 segment is unchanged. The anterior communicating artery is patent. Right A1 segment is normal. The A2 segments  and distal branches are normal otherwise. The left MCA is normal. --Posterior communicating arteries: Present bilaterally. POSTERIOR CIRCULATION: --Basilar artery: Normal. --Posterior cerebral arteries: Normal. --Superior cerebellar arteries: Normal. --Inferior cerebellar arteries: Normal anterior and posterior inferior cerebellar arteries. VENOUS SINUSES: As permitted by contrast timing, patent. ANATOMIC VARIANTS: None DELAYED PHASE: No parenchymal contrast enhancement. Redemonstration of acute or subacute infarct in the left frontoparietal MCA territory. Review of the MIP images confirms the above findings. IMPRESSION: 1. No emergent large vessel occlusion. 2. Approximately 60% stenosis of the left internal carotid artery by NASCET measurements. The eccentric appearance of the low density plaque within the lumen is unchanged. 3. Severe stenosis of the proximal left A1 segment, slightly worsened. 4. Mild narrowing of the distal portion of the right MCA M1 segment. 5. Acute/subacute infarct of the left frontoparietal MCA territory. 6.  Aortic Atherosclerosis (ICD10-I70.0). Electronically Signed   By: Ulyses Jarred M.D.   On: 07/14/2018 14:08   Mr Brain Wo Contrast  Result Date: 07/13/2018 CLINICAL DATA:  Altered mental status, somnolent. History of stroke and RIGHT-sided weakness. History of seizures, pseudotumor cerebral with lumboperitoneal shunt, hypertension, hypercholesterolemia, diabetes. EXAM: MRI HEAD WITHOUT CONTRAST TECHNIQUE: Multiplanar, multiecho pulse sequences of the brain and surrounding structures were obtained without intravenous contrast. COMPARISON:  CT HEAD July 13, 2018 and MRI head June 04, 2017. FINDINGS: INTRACRANIAL CONTENTS: Multifocal reduced diffusion LEFT frontoparietal lobes with low ADC values. LEFT frontoparietal encephalomalacia with mild hemosiderin staining and chronic microhemorrhages. Old bilateral basal ganglia infarcts. Multifocal small areas RIGHT frontal  encephalomalacia. Old small LEFT cerebellar infarct. Patchy supratentorial and pontine white matter FLAIR T2 hyperintensities. No midline shift, mass effect or masses. Mild parenchymal brain volume loss. No hydrocephalus. No abnormal extra-axial fluid collections. VASCULAR: Normal  major intracranial vascular flow voids present at skull base. SKULL AND UPPER CERVICAL SPINE: No abnormal sellar expansion. No suspicious calvarial bone marrow signal. Craniocervical junction maintained. SINUSES/ORBITS: The mastoid air-cells and included paranasal sinuses are well-aerated.The included ocular globes and orbital contents are non-suspicious. Status post bilateral ocular lens implants. OTHER: Patient is edentulous. IMPRESSION: 1. Acute multifocal LEFT frontoparietal/MCA territory infarcts 2. Old LEFT greater than RIGHT MCA territory infarcts. Old basal ganglia and cerebellar infarcts. 3. Moderate chronic small vessel ischemic changes. 4. Mild parenchymal brain volume loss. 5. Acute findings discussed with and reconfirmed by Dr.ELLIOTT WENTZ on 07/13/2018 at 5:24 pm. Electronically Signed   By: Elon Alas M.D.   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 Does the need for close, 24 hr/day medical supervision in concert with the patient's rehab needs make it unreasonable for this patient to be served in a less intensive setting? Yes 2. Co-Morbidities requiring supervision/potential complications: History of pseudotumor cerebri, type 2 diabetes with neuropathy 3. Due to bladder management, bowel management, safety, skin/wound care, disease management, medication administration, pain management and patient education, does the patient require 24 hr/day rehab nursing? Yes 4. Does the patient require coordinated care of a physician, rehab nurse, PT (1-2 hrs/day, 5 days/week), OT (1-2 hrs/day,  5 days/week) and SLP (.5-1 hrs/day, 5 days/week) to address physical and functional deficits in the context of the above medical diagnosis(es)? Yes Addressing deficits in the following areas: balance, endurance, locomotion, strength, transferring, bowel/bladder control, bathing, dressing, feeding, grooming, toileting, cognition, speech, language, swallowing and psychosocial support 5. Can the patient actively participate in an intensive therapy program of at least 3 hrs of therapy per day at least 5 days per week? Yes 6. The potential for patient to make measurable gains while on inpatient rehab is good 7. Anticipated functional outcomes upon discharge from inpatient rehab are supervision  with PT, supervision with OT, supervision with SLP. 8. Estimated rehab length of stay to reach the above functional goals is: 12-14d 9. Anticipated D/C setting: Home 10. Anticipated post D/C treatments: [place] therapy 11. Overall Rehab/Functional Prognosis: good  RECOMMENDATIONS: This patient's condition is appropriate for continued rehabilitative care in the following setting: CIR after CEA Patient has agreed to participate in recommended program. Yes Note that insurance prior authorization may be required for reimbursement for recommended care.  Comment: She is still completing CVA work-up "I have personally performed a face to face diagnostic evaluation of this patient.  Additionally, I have reviewed and concur with the physician assistant's documentation above." Charlett Blake M.D. [facility] FAAPM&R (Sports Med, Neuromuscular Med) Diplomate Am Board of [place], PA-C 07/14/2018

## 2018-07-15 ENCOUNTER — Inpatient Hospital Stay (HOSPITAL_COMMUNITY): Payer: Medicare Other

## 2018-07-15 DIAGNOSIS — I6523 Occlusion and stenosis of bilateral carotid arteries: Secondary | ICD-10-CM

## 2018-07-15 DIAGNOSIS — Z8673 Personal history of transient ischemic attack (TIA), and cerebral infarction without residual deficits: Secondary | ICD-10-CM

## 2018-07-15 LAB — GLUCOSE, CAPILLARY
GLUCOSE-CAPILLARY: 80 mg/dL (ref 70–99)
GLUCOSE-CAPILLARY: 82 mg/dL (ref 70–99)
GLUCOSE-CAPILLARY: 98 mg/dL (ref 70–99)
Glucose-Capillary: 107 mg/dL — ABNORMAL HIGH (ref 70–99)
Glucose-Capillary: 118 mg/dL — ABNORMAL HIGH (ref 70–99)
Glucose-Capillary: 95 mg/dL (ref 70–99)

## 2018-07-15 LAB — HEPARIN LEVEL (UNFRACTIONATED): HEPARIN UNFRACTIONATED: 0.7 [IU]/mL (ref 0.30–0.70)

## 2018-07-15 LAB — CBC
HEMATOCRIT: 35.1 % — AB (ref 36.0–46.0)
HEMOGLOBIN: 11 g/dL — AB (ref 12.0–15.0)
MCH: 27.4 pg (ref 26.0–34.0)
MCHC: 31.3 g/dL (ref 30.0–36.0)
MCV: 87.5 fL (ref 78.0–100.0)
Platelets: 220 10*3/uL (ref 150–400)
RBC: 4.01 MIL/uL (ref 3.87–5.11)
RDW: 14.6 % (ref 11.5–15.5)
WBC: 6.5 10*3/uL (ref 4.0–10.5)

## 2018-07-15 LAB — LDL CHOLESTEROL, DIRECT: LDL DIRECT: 132 mg/dL — AB (ref 0–99)

## 2018-07-15 MED ORDER — GEMFIBROZIL 600 MG PO TABS
600.0000 mg | ORAL_TABLET | Freq: Two times a day (BID) | ORAL | Status: DC
  Administered 2018-07-16 – 2018-07-29 (×22): 600 mg via ORAL
  Filled 2018-07-15 (×30): qty 1

## 2018-07-15 NOTE — Care Management Note (Signed)
Case Management Note  Patient Details  Name: [name] MRN: [id number] Date of Birth: 04-21-1951  Subjective/Objective:          Pt admitted with CVA. She is from home with spouse. Pt uses wheelchair at baseline.           Action/Plan: Pt to have CE on Tuesday. CIR is the plan once surgery is completed. CM following.  Expected Discharge Date:                  Expected Discharge Plan:  [place]  In-House Referral:     Discharge planning Services  CM Consult  Post Acute Care Choice:    Choice offered to:     DME Arranged:    DME Agency:     HH Arranged:    [place] Agency:     Status of Service:  In process, will continue to follow  If discussed at Long Length of Stay Meetings, dates discussed:    Additional Comments:  Pollie Friar, RN 07/15/2018, 2:59 PM

## 2018-07-15 NOTE — Progress Notes (Addendum)
Patient Demographics:    [name], is a 67 y.o. female, DOB - Jan 08, 1951, EUM:[id number]  Admit date - 07/13/2018   Admitting Physician Toy Baker, MD  Outpatient Primary MD for the patient is [name], MD  LOS - 2   No chief complaint on file.       Subjective:    [facility] today has no fevers, no emesis,  No chest pain, husband at bedside resting comfortably  Assessment  & Plan :    Active Problems:   Acute CVA (cerebrovascular accident) ([place])   Type II diabetes mellitus with complication (HCC)   Benign essential HTN   Hypokalemia   CVA (cerebral vascular accident) ([place])   Diabetes mellitus ([place])  Brief summary  67 y.o. female with medical history significant of DM 2, CVA, anemia, GERD, HLD, HTN seizures disorder.Pseudotumor cerebri, chronic refractory headaches, debility, sp PEG and Trach now removed admitted on 07/13/2018 with unresponsive episode and found to have acute stroke,  left ICA bifurcation calcified plaque and soft plaque with thrombus protruding into the lumen    Plan:- 1)Acute multifocal Lt frontoparietal/Lt MCA Infarcts--CTA head and neck shows  left ICA bifurcation calcified plaque and soft plaque with thrombus protruding into the lumen,   direct LDL 132, triglycerides over 420 , patient apparently is intolerant to statin drugs consider PCSK9 inhibitors as outpatient, patient apparently is intolerant to Zetia as well, will give Lopid for hypertriglyceridemia. plans for vascular intervention/CEA left ICA on Tuesday 07/20/18, continue IV heparin as recommended by neurologist, echo with preserved EF over 65% no intracardiac thrombus identified.  Post left ICA CEA procedure well vascular surgery on 07/20/2018 IV heparin will be discontinued and patient may be restarted on Plavix.  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  Post left ICA CEA procedure well vascular surgery on 07/20/2018 IV heparin will be discontinued and patient may be restarted on Plavix  Code Status : Full code   Disposition Plan  : TBD --- possibly CIR  Consults  : Neurology and vascular surgery   DVT Prophylaxis  : IV heparin Lab Results  Component Value Date   PLT 220 07/15/2018    Inpatient Medications  Scheduled Meds: . insulin aspart  0-9 Units Subcutaneous Q4H  . insulin glargine  15 Units Subcutaneous QHS   Continuous Infusions: . sodium chloride 75 mL/hr at 07/15/18 1728  . heparin 650 Units/hr (07/15/18 0857)   PRN Meds:.acetaminophen **OR** acetaminophen (TYLENOL) oral liquid 160 mg/5 mL **  OR** acetaminophen, ondansetron (ZOFRAN) IV    Anti-infectives (From admission, onward)   None        Objective:   Vitals:   07/15/18 0313 07/15/18 0818 07/15/18 1133 07/15/18 1554  BP: (!) 144/91 (!) 160/93 (!) 171/107 (!) 165/78  Pulse: 99 (!) 110 98 (!) 106  Resp: 18 16 20 20   Temp: 98 F (36.7 C) 98.7 F (37.1 C) (!) 97.5 F (36.4 C) 98.4 F (36.9 C)  TempSrc: Oral Oral Oral Oral  SpO2: 98% 96% 95% 99%  Weight:      Height:         Wt Readings from Last 3 Encounters:  07/13/18 59 kg  05/18/18 61.7 kg  07/13/17 78 kg     Intake/Output Summary (Last 24 hours) at 07/15/2018 1945 Last data filed at 07/15/2018 1240 Gross per 24 hour  Intake 1065 ml  Output 800 ml  Net 265 ml     Physical Exam Patient is examined daily including today on 07/14/18 , exams remain the same as of yesterday except that has changed   Gen:- Awake Alert, in no acute distress HEENT:- [place].AT, No sclera icterus Neck-Supple Neck,No JVD,.  Lungs-  CTAB , fair air movement CV- S1, S2 normal Abd-  +ve B.Sounds, Abd Soft, No tenderness,    Extremity/Skin:- No  edema,   good pulses Psych-affect is appropriate, oriented x3 Neuro-right-sided hemiparesis, RUE > RLE, no tremors   Data Review:   Micro Results No results found for this or any previous visit (from the past 240 hour(s)).  Radiology Reports Ct Angio Head W Or Wo Contrast  Result Date: 07/14/2018 CLINICAL DATA:  Recent infarcts. EXAM: CT ANGIOGRAPHY HEAD AND NECK TECHNIQUE: Multidetector CT imaging of the head and neck was performed using the standard protocol during bolus administration of intravenous contrast. Multiplanar CT image reconstructions and MIPs were obtained to evaluate the vascular anatomy. Carotid stenosis measurements (when applicable) are obtained utilizing NASCET criteria, using the distal internal carotid diameter as the denominator. CONTRAST:  16mL ISOVUE-370 IOPAMIDOL (ISOVUE-370) INJECTION 76% COMPARISON:  Brain MRI 07/13/2018 CTA head 06/23/2017 CTA neck and head 06/03/2017 FINDINGS: CTA NECK FINDINGS AORTIC ARCH: There is mild calcific atherosclerosis of the aortic arch. There is no aneurysm, dissection or hemodynamically significant stenosis of the visualized ascending aorta and aortic arch. Conventional 3 vessel aortic branching pattern. The visualized proximal subclavian arteries are widely patent. RIGHT CAROTID SYSTEM: --Common carotid artery: Widely patent  origin without common carotid artery dissection or aneurysm. --Internal carotid artery: No dissection, occlusion or aneurysm. Mild atherosclerotic calcification at the carotid bifurcation without hemodynamically significant stenosis. --External carotid artery: No acute abnormality. LEFT CAROTID SYSTEM: --Common carotid artery: Widely patent origin without common carotid artery dissection or aneurysm. --Internal carotid artery:There is eccentric fibrofatty plaque within the proximal internal carotid artery that causes a stenosis of approximately 60% by NASCET criteria. The distal internal carotid artery is widely patent. --External carotid artery: No acute abnormality. VERTEBRAL ARTERIES: Left dominant configuration. Both origins are normal. No dissection, occlusion or flow-limiting stenosis to the vertebrobasilar confluence. SKELETON: There is no bony spinal canal stenosis. No lytic or blastic lesion. OTHER NECK: Normal pharynx, larynx and major salivary glands. No cervical lymphadenopathy. Unremarkable thyroid gland. UPPER CHEST: No pneumothorax or pleural effusion. No nodules or masses. CTA HEAD FINDINGS ANTERIOR CIRCULATION: --Intracranial internal carotid arteries: Atherosclerotic calcification of the internal carotid arteries at the skull base without hemodynamically significant stenosis. --Anterior cerebral arteries: Short segment high-grade stenosis versus occlusion of the  proximal left A1 segment is unchanged. The anterior communicating artery is patent. Right A1 segment is normal. The A2 segments and distal branches are normal otherwise. The left MCA is normal. --Posterior communicating arteries: Present bilaterally. POSTERIOR CIRCULATION: --Basilar artery: Normal. --Posterior cerebral arteries: Normal. --Superior cerebellar arteries: Normal. --Inferior cerebellar arteries: Normal anterior and posterior inferior cerebellar arteries. VENOUS SINUSES: As permitted by contrast timing, patent. ANATOMIC VARIANTS:  None DELAYED PHASE: No parenchymal contrast enhancement. Redemonstration of acute or subacute infarct in the left frontoparietal MCA territory. Review of the MIP images confirms the above findings. IMPRESSION: 1. No emergent large vessel occlusion. 2. Approximately 60% stenosis of the left internal carotid artery by NASCET measurements. The eccentric appearance of the low density plaque within the lumen is unchanged. 3. Severe stenosis of the proximal left A1 segment, slightly worsened. 4. Mild narrowing of the distal portion of the right MCA M1 segment. 5. Acute/subacute infarct of the left frontoparietal MCA territory. 6.  Aortic Atherosclerosis (ICD10-I70.0). Electronically Signed   By: Ulyses Jarred M.D.   On: 07/14/2018 14:08   Dg Chest 2 View  Result Date: 07/13/2018 CLINICAL DATA:  Generalized weakness.  Hypertension. EXAM: CHEST - 2 VIEW COMPARISON:  June 23, 2017 FINDINGS: There is mild scarring in the left base with questionable small left pleural effusion. Lungs elsewhere clear. Heart is mildly enlarged with pulmonary vascularity normal. No adenopathy. There is aortic atherosclerosis. No evident bone lesions. IMPRESSION: Mild scarring left base with questionable small left pleural effusion. Lungs elsewhere clear. Mild cardiac prominence. There is aortic atherosclerosis. Aortic Atherosclerosis (ICD10-I70.0). Electronically Signed   By: Lowella Grip III M.D.   On: 07/13/2018 13:13   Ct Head Wo Contrast  Result Date: 07/13/2018 CLINICAL DATA:  Altered mental status EXAM: CT HEAD WITHOUT CONTRAST TECHNIQUE: Contiguous axial images were obtained from the base of the skull through the vertex without intravenous contrast. COMPARISON:  06/23/2017, 06/04/2017 FINDINGS: Brain: Brain atrophy noted. Chronic white matter microvascular changes. Progressive subcortical white matter scattered hypodensities throughout both cerebral hemispheres, worse in the left frontal lobe compatible with evolutionary  changes of previous multifocal infarcts from 06/04/2017. No acute intracranial hemorrhage, mass lesion, definite new infarction, midline shift, herniation, hydrocephalus, or extra-axial fluid collection. No focal mass effect or edema. Cisterns are patent. Cerebellar atrophy as well. Vascular: Intracranial atherosclerosis.  No hyperdense vessel. Skull: Normal. Negative for fracture or focal lesion. Sinuses/Orbits: No acute finding. Other: None. IMPRESSION: Chronic brain atrophy and white matter microvascular ischemic changes. Evolutionary changes of multifocal subcortical infarcts bilaterally, largest in the high left frontal lobe when compared 06/04/2017 MRI. No acute intracranial hemorrhage. Electronically Signed   By: Jerilynn Mages.  Shick M.D.   On: 07/13/2018 13:07   Ct Angio Neck W Or Wo Contrast  Result Date: 07/14/2018 CLINICAL DATA:  Recent infarcts. EXAM: CT ANGIOGRAPHY HEAD AND NECK TECHNIQUE: Multidetector CT imaging of the head and neck was performed using the standard protocol during bolus administration of intravenous contrast. Multiplanar CT image reconstructions and MIPs were obtained to evaluate the vascular anatomy. Carotid stenosis measurements (when applicable) are obtained utilizing NASCET criteria, using the distal internal carotid diameter as the denominator. CONTRAST:  44mL ISOVUE-370 IOPAMIDOL (ISOVUE-370) INJECTION 76% COMPARISON:  Brain MRI 07/13/2018 CTA head 06/23/2017 CTA neck and head 06/03/2017 FINDINGS: CTA NECK FINDINGS AORTIC ARCH: There is mild calcific atherosclerosis of the aortic arch. There is no aneurysm, dissection or hemodynamically significant stenosis of the visualized ascending aorta and aortic arch. Conventional 3 vessel aortic branching pattern. The visualized  proximal subclavian arteries are widely patent. RIGHT CAROTID SYSTEM: --Common carotid artery: Widely patent origin without common carotid artery dissection or aneurysm. --Internal carotid artery: No dissection,  occlusion or aneurysm. Mild atherosclerotic calcification at the carotid bifurcation without hemodynamically significant stenosis. --External carotid artery: No acute abnormality. LEFT CAROTID SYSTEM: --Common carotid artery: Widely patent origin without common carotid artery dissection or aneurysm. --Internal carotid artery:There is eccentric fibrofatty plaque within the proximal internal carotid artery that causes a stenosis of approximately 60% by NASCET criteria. The distal internal carotid artery is widely patent. --External carotid artery: No acute abnormality. VERTEBRAL ARTERIES: Left dominant configuration. Both origins are normal. No dissection, occlusion or flow-limiting stenosis to the vertebrobasilar confluence. SKELETON: There is no bony spinal canal stenosis. No lytic or blastic lesion. OTHER NECK: Normal pharynx, larynx and major salivary glands. No cervical lymphadenopathy. Unremarkable thyroid gland. UPPER CHEST: No pneumothorax or pleural effusion. No nodules or masses. CTA HEAD FINDINGS ANTERIOR CIRCULATION: --Intracranial internal carotid arteries: Atherosclerotic calcification of the internal carotid arteries at the skull base without hemodynamically significant stenosis. --Anterior cerebral arteries: Short segment high-grade stenosis versus occlusion of the proximal left A1 segment is unchanged. The anterior communicating artery is patent. Right A1 segment is normal. The A2 segments and distal branches are normal otherwise. The left MCA is normal. --Posterior communicating arteries: Present bilaterally. POSTERIOR CIRCULATION: --Basilar artery: Normal. --Posterior cerebral arteries: Normal. --Superior cerebellar arteries: Normal. --Inferior cerebellar arteries: Normal anterior and posterior inferior cerebellar arteries. VENOUS SINUSES: As permitted by contrast timing, patent. ANATOMIC VARIANTS: None DELAYED PHASE: No parenchymal contrast enhancement. Redemonstration of acute or subacute infarct  in the left frontoparietal MCA territory. Review of the MIP images confirms the above findings. IMPRESSION: 1. No emergent large vessel occlusion. 2. Approximately 60% stenosis of the left internal carotid artery by NASCET measurements. The eccentric appearance of the low density plaque within the lumen is unchanged. 3. Severe stenosis of the proximal left A1 segment, slightly worsened. 4. Mild narrowing of the distal portion of the right MCA M1 segment. 5. Acute/subacute infarct of the left frontoparietal MCA territory. 6.  Aortic Atherosclerosis (ICD10-I70.0). Electronically Signed   By: Ulyses Jarred M.D.   On: 07/14/2018 14:08   Mr Brain Wo Contrast  Result Date: 07/13/2018 CLINICAL DATA:  Altered mental status, somnolent. History of stroke and RIGHT-sided weakness. History of seizures, pseudotumor cerebral with lumboperitoneal shunt, hypertension, hypercholesterolemia, diabetes. EXAM: MRI HEAD WITHOUT CONTRAST TECHNIQUE: Multiplanar, multiecho pulse sequences of the brain and surrounding structures were obtained without intravenous contrast. COMPARISON:  CT HEAD July 13, 2018 and MRI head June 04, 2017. FINDINGS: INTRACRANIAL CONTENTS: Multifocal reduced diffusion LEFT frontoparietal lobes with low ADC values. LEFT frontoparietal encephalomalacia with mild hemosiderin staining and chronic microhemorrhages. Old bilateral basal ganglia infarcts. Multifocal small areas RIGHT frontal encephalomalacia. Old small LEFT cerebellar infarct. Patchy supratentorial and pontine white matter FLAIR T2 hyperintensities. No midline shift, mass effect or masses. Mild parenchymal brain volume loss. No hydrocephalus. No abnormal extra-axial fluid collections. VASCULAR: Normal major intracranial vascular flow voids present at skull base. SKULL AND UPPER CERVICAL SPINE: No abnormal sellar expansion. No suspicious calvarial bone marrow signal. Craniocervical junction maintained. SINUSES/ORBITS: The mastoid air-cells and  included paranasal sinuses are well-aerated.The included ocular globes and orbital contents are non-suspicious. Status post bilateral ocular lens implants. OTHER: Patient is edentulous. IMPRESSION: 1. Acute multifocal LEFT frontoparietal/MCA territory infarcts 2. Old LEFT greater than RIGHT MCA territory infarcts. Old basal ganglia and cerebellar infarcts. 3. Moderate chronic small vessel ischemic changes.  4. Mild parenchymal brain volume loss. 5. Acute findings discussed with and reconfirmed by Dr.ELLIOTT WENTZ on 07/13/2018 at 5:24 pm. Electronically Signed   By: Elon Alas M.D.   On: 07/13/2018 17:26     CBC Recent Labs  Lab 07/13/18 1109 07/13/18 1125 07/14/18 0805 07/15/18 0535  WBC 10.8*  --  8.3 6.5  HGB 13.8 15.0 12.4 11.0*  HCT 44.7 44.0 39.3 35.1*  PLT 324  --  259 220  MCV 88.5  --  86.8 87.5  MCH 27.3  --  27.4 27.4  MCHC 30.9  --  31.6 31.3  RDW 14.6  --  14.6 14.6  LYMPHSABS 2.3  --   --   --   MONOABS 0.7  --   --   --   EOSABS 0.2  --   --   --   BASOSABS 0.1  --   --   --     Chemistries  Recent Labs  Lab 07/13/18 1109 07/13/18 1125 07/13/18 2353 07/14/18 0805  NA 138 138  --  141  K 3.2* 3.5  --  3.6  CL 100 103  --  111  CO2 24  --   --  21*  GLUCOSE 130* 128*  --  95  BUN 38* 40*  --  16  CREATININE 1.27* 1.40*  --  0.88  CALCIUM 9.9  --   --  9.0  MG  --   --  1.7  --   AST 27  --   --   --   ALT 23  --   --   --   ALKPHOS 121  --   --   --   BILITOT 0.4  --   --   --    ------------------------------------------------------------------------------------------------------------------ Recent Labs    07/14/18 0007 07/14/18 0805  CHOL 258*  --   HDL 32*  --   LDLCALC UNABLE TO CALCULATE IF TRIGLYCERIDE OVER 400 mg/dL  --   TRIG 420*  --   CHOLHDL 8.1  --   LDLDIRECT  --  132*    Lab Results  Component Value Date   HGBA1C 6.3 (H) 07/13/2018    ------------------------------------------------------------------------------------------------------------------ No results for input(s): TSH, T4TOTAL, T3FREE, THYROIDAB in the last 72 hours.  Invalid input(s): FREET3 ------------------------------------------------------------------------------------------------------------------ No results for input(s): VITAMINB12, FOLATE, FERRITIN, TIBC, IRON, RETICCTPCT in the last 72 hours.  Coagulation profile Recent Labs  Lab 07/13/18 1109  INR 1.00    No results for input(s): DDIMER in the last 72 hours.  Cardiac Enzymes No results for input(s): CKMB, TROPONINI, MYOGLOBIN in the last 168 hours.  Invalid input(s): CK ------------------------------------------------------------------------------------------------------------------ No results found for: BNP   Roxan Hockey M.D on 07/15/2018 at 7:45 PM  Pager---[phone] Go to [email or web address] - password [email or web address] for contact info  [facility] - Office  [phone]

## 2018-07-15 NOTE — Progress Notes (Signed)
Inpatient Rehabilitation-Admissions Coordinator    Met with patient and her husband and family at the bedside to discuss team's recommendation for inpatient rehabilitation. Shared booklets and  expectations while in CIR.  Family interested in CIR as they have been prior and pt had good outcome. Noted pt has planned CEA next week. AC discussed that pt will be reassessed after her procedure and her candidacy for IP rehab will be based on her functional level at that time. Family verbalized understanding.   AC will follow for post acute needs once medically ready.   Jhonnie Garner, OTR/L  Rehab Admissions Coordinator  [phone] 07/15/2018 12:12 PM

## 2018-07-15 NOTE — Progress Notes (Signed)
STROKE TEAM PROGRESS NOTE   SUBJECTIVE (INTERVAL HISTORY) No family is at the bedside. Pt neuro stable, no acute event overnight. She passed swallow and currently on liquid diet given poor denture. Meds via puree. Dr. Donzetta Matters saw her yesterday and agreed with left CEA. He is talking with her husband for finalizing the surgery.    OBJECTIVE Temp:  [98 F (36.7 C)-98.7 F (37.1 C)] 98.7 F (37.1 C) (09/27 0818) Pulse Rate:  [91-110] 110 (09/27 0818) Cardiac Rhythm: Normal sinus rhythm (09/27 0700) Resp:  [16-18] 16 (09/27 0818) BP: (144-187)/(86-93) 160/93 (09/27 0818) SpO2:  [96 %-100 %] 96 % (09/27 0818)  Recent Labs  Lab 07/14/18 1149 07/14/18 1627 07/14/18 1925 07/14/18 2333 07/15/18 0312  GLUCAP 100* 110* 90 98 80   Recent Labs  Lab 07/13/18 1109 07/13/18 1125 07/13/18 2353 07/14/18 0805  NA 138 138  --  141  K 3.2* 3.5  --  3.6  CL 100 103  --  111  CO2 24  --   --  21*  GLUCOSE 130* 128*  --  95  BUN 38* 40*  --  16  CREATININE 1.27* 1.40*  --  0.88  CALCIUM 9.9  --   --  9.0  MG  --   --  1.7  --   PHOS  --   --  2.9  --    Recent Labs  Lab 07/13/18 1109  AST 27  ALT 23  ALKPHOS 121  BILITOT 0.4  PROT 7.2  ALBUMIN 4.0   Recent Labs  Lab 07/13/18 1109 07/13/18 1125 07/14/18 0805 07/15/18 0535  WBC 10.8*  --  8.3 6.5  NEUTROABS 7.5  --   --   --   HGB 13.8 15.0 12.4 11.0*  HCT 44.7 44.0 39.3 35.1*  MCV 88.5  --  86.8 87.5  PLT 324  --  259 220   No results for input(s): CKTOTAL, CKMB, CKMBINDEX, TROPONINI in the last 168 hours. Recent Labs    07/13/18 1109  LABPROT 13.1  INR 1.00   No results for input(s): COLORURINE, LABSPEC, PHURINE, GLUCOSEU, HGBUR, BILIRUBINUR, KETONESUR, PROTEINUR, UROBILINOGEN, NITRITE, LEUKOCYTESUR in the last 72 hours.  Invalid input(s): APPERANCEUR     Component Value Date/Time   CHOL 258 (H) 07/14/2018 0007   TRIG 420 (H) 07/14/2018 0007   HDL 32 (L) 07/14/2018 0007   CHOLHDL 8.1 07/14/2018 0007   VLDL  UNABLE TO CALCULATE IF TRIGLYCERIDE OVER 400 mg/dL 07/14/2018 0007   LDLCALC UNABLE TO CALCULATE IF TRIGLYCERIDE OVER 400 mg/dL 07/14/2018 0007   Lab Results  Component Value Date   HGBA1C 6.3 (H) 07/13/2018   No results found for: LABOPIA, COCAINSCRNUR, LABBENZ, AMPHETMU, THCU, LABBARB  No results for input(s): ETH in the last 168 hours.    IMAGING  I have personally reviewed the radiological images below and agree with the radiology interpretations.  Dg Chest 2 View 07/13/2018 IMPRESSION:  Mild scarring left base with questionable small left pleural effusion. Lungs elsewhere clear. Mild cardiac prominence. There is aortic atherosclerosis. Aortic Atherosclerosis (ICD10-I70.0).    Ct Head Wo Contrast 07/13/2018 IMPRESSION:  Chronic brain atrophy and white matter microvascular ischemic changes. Evolutionary changes of multifocal subcortical infarcts bilaterally, largest in the high left frontal lobe when compared 06/04/2017 MRI. No acute intracranial hemorrhage.    Mr Brain Wo Contrast 07/13/2018 IMPRESSION:  1. Acute multifocal LEFT frontoparietal/MCA territory infarcts  2. Old LEFT greater than RIGHT MCA territory infarcts. Old basal ganglia and cerebellar  infarcts.  3. Moderate chronic small vessel ischemic changes.  4. Mild parenchymal brain volume loss.    CTA Head and Neck 07/14/2018 IMPRESSION: 1. No emergent large vessel occlusion. 2. Approximately 60% stenosis of the left internal carotid artery by NASCET measurements. The eccentric appearance of the low density plaque within the lumen is unchanged. 3. Severe stenosis of the proximal left A1 segment, slightly worsened. 4. Mild narrowing of the distal portion of the right MCA M1 segment. 5. Acute/subacute infarct of the left frontoparietal MCA territory. 6.  Aortic Atherosclerosis (ICD10-I70.0).    Transthoracic Echocardiogram  07/14/2018 Study Conclusions  - Left ventricle: The cavity size was normal. There  was mild   concentric hypertrophy. Systolic function was vigorous. The   estimated ejection fraction was in the range of 65% to 70%. Wall   motion was normal; there were no regional wall motion   abnormalities. Doppler parameters are consistent with abnormal   left ventricular relaxation (grade 1 diastolic dysfunction).   There was no evidence of elevated ventricular filling pressure by   Doppler parameters. - Aortic valve: There was no regurgitation. - Mitral valve: There was no regurgitation. - Right ventricle: The cavity size was normal. Wall thickness was   normal. Systolic function was normal. - Tricuspid valve: There was trivial regurgitation. - Inferior vena cava: The vessel was normal in size. - Pericardium, extracardiac: There was no pericardial effusion. Impressions: - No cardiac source of emboli was indentified.     PHYSICAL EXAM  Temp:  [98 F (36.7 C)-98.7 F (37.1 C)] 98.7 F (37.1 C) (09/27 0818) Pulse Rate:  [91-110] 110 (09/27 0818) Resp:  [16-18] 16 (09/27 0818) BP: (144-187)/(86-93) 160/93 (09/27 0818) SpO2:  [96 %-100 %] 96 % (09/27 0818)  General - Well nourished, well developed, lethargic.  Ophthalmologic - fundi not visualized due to noncooperation.  Cardiovascular - Regular rate and rhythm.  Neuro -awake, alert, lethargic.  Orientated to self and place, not orientated to time and age.  No aphasia, however paucity of speech, able to follow single commands, mild to moderate dysarthria.  Able to name and repeat simple sentences.  No hemianopia, visual field to full, PERRL, EOMI, left mild nasolabial fold flattening.  Tongue midline.  Bilateral upper extremity 4/5, with mild postural tremor, bilateral lower extremity 3/5 proximal and 4/5 distal.  DTR 1+, no Babinski.  Sensation symmetrical, bilateral upper extremity intact coordination, however slow on action.  Gait not tested.   ASSESSMENT/PLAN Ms. KEARRA CALKIN is a 67 y.o. 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 No tPA given due to outside window.    Stroke:  left MCA and PCA patchy infarcts, embolic likely secondary to left ICA bifurcation soft plaque rupture  Resultant lethargy, dysphagia  CT chronic bilateral patchy infarcts, left frontal new infarcts since last exam but not acute this time.  MRI left MCA and PCA patchy infarcts  CTA head neck bilateral ICA bifurcation calcified and soft plaques with left ICA 60% stenosis and thrombus protruding into the lumen  2D Echo - EF 65 - 70%. No cardiac source of emboli identified.   LDL - NTC due to TG 420  HgbA1c 6.3  SCDs for VTE prophylaxis  clopidogrel 75 mg daily prior to admission, now on heparin IV given left ICA carotid thrombus into  the lumen. Once left CEA completed, can resume plavix on discharge.   Patient counseled to be compliant with her antithrombotic medications  Ongoing aggressive stroke risk factor management  Therapy recommendations: Pending  Disposition: Pending  Carotid stenosis and atherosclerosis  05/2017 CT head and neck showed left ICA 60 to 65% with significant calcified and soft plaque  05/2017 carotid Doppler showed left ICA 40 to 59% stenosis  05/2017 cerebral angiogram showed left ICA less than 50% by Dr. Bridgett Larsson  This admission CTA head and neck again showed left ICA stenosis with significant calcified and soft plaque with thrombus protruding into the lumen  Currently on heparin IV, once CEA completed, can resume plavix on discharge.  Vascular surgery consulted and Dr. Donzetta Matters agrees on left CEA.   History of strokes  05/2017 stroke due to right hand weakness, MRI showed left MCA patchy infarcts, CTA head and neck left ICA 60 to 65% stenosis.   EF 60/60%.  Carotid Doppler left ICA 40 to 59%.  Cerebral angiogram left ICA less than 50% by VVS.  LDL 211 and A1c 9.5.  05/2017 patient developed new symptoms after cerebral angiogram, CT abdomen showed bilateral M1 thrombosis, had thrombectomy status post bilateral MCA TICI3 reperfusion.  MRI showed bilateral inferior and superior infarcts indicating embolic source.  EEG negative.  Stroke considered angiogram procedure related.  She underwent trach and PEG and eventually discharged to inpatient rehab with DAPT.  She has statin intolerance, recommend outpatient PCSK9 inhibitors.  She follows with Dr. Jaynee Eagles at [facility], was later on Plavix PTA.  Plain CT this admission showed possible interval left MCA infarcts  Diabetes  HgbA1c 6.3 goal < 7.0  Controlled  Currently on Lantus  CBG monitoring  SSI  DM education and close PCP follow up  Hypertension . Stable . Permissive hypertension (OK if <180/105) for 24-48 hours post stroke and then gradually normalized within 5-7 days.  Long term BP goal 130-150 given bilateral ICA stenosis  Hyperlipidemia  Home meds: None  LDL NTC due to TG 420, goal < 70  Consider Zetia once p.o. 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 Please call with questions. Pt will follow up with Dr. Jaynee Eagles at [facility] in about 4 weeks. Thanks for the consult.  Rosalin Hawking, MD PhD Stroke Neurology 07/15/2018 2:21 PM    To contact Stroke Continuity provider, please refer to [email or web address] After hours, contact General Neurology

## 2018-07-15 NOTE — Consult Note (Signed)
[place] for Heparin  Indication: stroke  Allergies  Allergen Reactions  . Carbamazepine Hives, Shortness Of Breath and Other (See Comments)  . Penicillins Anaphylaxis, Other (See Comments) and Swelling    Mother, father and brother have history of anaphylaxis reaction to penicillin so pt does not take  My throat swells Familial history of anaphylaxis   . Statins Other (See Comments)    Severe muscle weakness, leg numbness, severe headaches, chest pain   . Tricyclic Antidepressants Other (See Comments)    IMPAIRED MEMORY  . Atorvastatin Other (See Comments)    Severe muscle weakness, leg numbness, severe headaches, chest pain   . Ezetimibe Other (See Comments)    Severe muscle weakness, leg numbness, severe headaches, chest pain   . Nortriptyline Other (See Comments)    IMPAIRED MEMORY  . Quinapril Hcl Other (See Comments)    Unknown reaction  . Rifampin Diarrhea  . Metoclopramide Nausea And Vomiting and Rash     Labs: Recent Labs    07/13/18 1109 07/13/18 1125 07/14/18 0805 07/14/18 2134 07/15/18 0535  HGB 13.8 15.0 12.4  --  11.0*  HCT 44.7 44.0 39.3  --  35.1*  PLT 324  --  259  --  220  APTT 22*  --   --   --   --   LABPROT 13.1  --   --   --   --   INR 1.00  --   --   --   --   HEPARINUNFRC  --   --   --  0.49 0.70  CREATININE 1.27* 1.40* 0.88  --   --     Estimated Creatinine Clearance: 49.7 mL/min (by C-G formula based on SCr of 0.88 mg/dL).    Assessment: 67 YO female requiring heparin anticoagulation for acute CVA.  Heparin level = 0.7 HgB = 11 (trending down)  Carotid endarterectomy planned for 10/1  Goal of Therapy:  Heparin level 0.3-0.5 units/ml Monitor platelets by anticoagulation protocol: Yes   Plan:  Decrease heparin to 650 units / hr Daily heparin level, CBC  Thank you Anette Guarneri, PharmD [phone]  07/15/2018,8:53 AM

## 2018-07-15 NOTE — Progress Notes (Addendum)
   Tentative plan is for left carotid endarterectomy in the operating room early next week likely Tuesday.  I will need to discuss this with her husband at a more reasonable hour of the day.  Addendum to follow pending our discussion.   Servando Snare, MD  Addendum: I have spoken with her husband and no definitive decision on surgery has been reached. He is going to talk with her this weekend. Tentative plan for left carotid endarterectomy on Tuesday but will further discuss with patient and her family on Monday.  Teauna Dubach C. Donzetta Matters, MD [facility] Office: [phone] Pager: [phone]

## 2018-07-15 NOTE — Care Management Important Message (Signed)
Important Message  Patient Details  Name: [name] MRN: [id number] Date of Birth: 05/22/1951   Medicare Important Message Given:  Yes    Muna Demers 07/15/2018, 3:20 PM

## 2018-07-15 NOTE — Evaluation (Addendum)
Speech Language Pathology Evaluation Patient Details Name: [name] MRN: [id number] DOB: [birth year] Today's Date: 07/15/2018 Time: 1010-1030 SLP Time Calculation (min) (ACUTE ONLY): 20 min  Problem List:  Patient Active Problem List   Diagnosis Date Noted  . Adjustment disorder with depressed mood 11/02/2017  . Adjustment disorder with mixed anxiety and depressed mood   . Hemoptysis   . Hematemesis   . Intractable vomiting with nausea   . Acute lower UTI   . Urinary retention   . Diabetic gastroparesis ([place])   . Vascular headache   . Diabetes mellitus ([place])   . CVA (cerebral vascular accident) ([place]) 06/18/2017  . Weakness   . 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 Right hemiparesis ([place])   . DNR (do not resuscitate) discussion 06/16/2017  . Palliative care by specialist 06/16/2017  . Anxiety state   . Acute embolic stroke ([place])   . Benign essential HTN   . Diabetic peripheral neuropathy ([place])   . Pseudotumor cerebri   . Hypokalemia   . Seizure ([place])   . Leukocytosis   . Acute blood loss anemia   . Post-operative pain   . Encounter for feeding tube placement   . Respiratory failure ([place])   . Status post tracheostomy ([place])   . Acute respiratory failure with hypoxemia ([place])   . Stenosis of left carotid artery   . Acute CVA (cerebrovascular accident) ([place]) 05/31/2017  . Type II diabetes mellitus with complication ([place]) 16/96/7893  . Hypertension 05/31/2017  . Hyponatremia 05/31/2017  . IIH (idiopathic intracranial hypertension) 01/18/2016  . Malfunction of ventriculo-peritoneal shunt ([place]) 01/18/2016  . 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 Worsening headaches 01/18/2016  . Increased abdominal girth 01/18/2016  . Abdominal fluid collection 01/18/2016  . Vision changes 01/18/2016  . Perceived hearing changes 01/18/2016  . Nausea without vomiting 01/18/2016  . Peripheral vision loss 01/18/2016  . Imbalance 01/18/2016  . Falls 01/18/2016   . Dysphagia, oropharyngeal 01/18/2016  . Subacute confusional state 01/18/2016  . Left breast mass 05/05/2011   Past Medical History:  Past Medical History:  Diagnosis Date  . Anemia    takes iron supplement  . Anesthesia complication    disorientation due to pseudotumor  . Diabetes mellitus    IDDM  . Family history of anesthesia complication 80 yrs ago   brother stopped breathing for a minute or two  . GERD (gastroesophageal reflux disease)   . Headache(784.0)    due to pseudotumor; daily headache  . Hypercholesteremia    unable to tolerate statins  . Hypertension    under control with meds., has been on med. x 5 yr.  . Peripheral neuropathy   . PONV (postoperative nausea and vomiting)   . 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 Seizures ([place])    due to shunt failure; last seizure 2007  . Synovitis of ankle 04/2013   left  . Wears dentures    full   Past Surgical History:  Past Surgical History:  Procedure Laterality Date  . ANKLE ARTHROSCOPY Left 05/18/2013   Procedure: LEFT ANKLE ARTHROSCOPY WITH DEBRIDEMENT,  SUBTALAR OPEN DEBRIDEMENT ;  Surgeon: Wylene Simmer, MD;  Location: [facility];  Service: Orthopedics;  Laterality: Left;  . APPENDECTOMY     as a child  . BALLOON DILATION N/A 07/04/2013   Procedure: BALLOON DILATION;  Surgeon: Garlan Fair, MD;  Location: [facility];  Service: Endoscopy;  Laterality: N/A;  .  BLADDER SUSPENSION    . BREAST LUMPECTOMY W/ NEEDLE LOCALIZATION Left 05/22/2011  . CAROTID ANGIOGRAPHY Bilateral 06/03/2017   Procedure: Bilateral Carotid Angiography;  Surgeon: Conrad Crestview, MD;  Location: [facility];  Service: Cardiovascular;  Laterality: Bilateral;  . COLONOSCOPY WITH PROPOFOL N/A 05/14/2014   Procedure: COLONOSCOPY WITH PROPOFOL;  Surgeon: Garlan Fair, MD;  Location: [facility];  Service: Endoscopy;  Laterality: N/A;  . ESOPHAGEAL DILATION  06/23/2006; 08/05/2004  .  ESOPHAGOGASTRODUODENOSCOPY N/A 07/04/2013   Procedure: ESOPHAGOGASTRODUODENOSCOPY (EGD);  Surgeon: Garlan Fair, MD;  Location: [facility];  Service: Endoscopy;  Laterality: N/A;  . ESOPHAGOGASTRODUODENOSCOPY (EGD) WITH PROPOFOL N/A 05/14/2014   Procedure: ESOPHAGOGASTRODUODENOSCOPY (EGD) WITH PROPOFOL;  Surgeon: Garlan Fair, MD;  Location: [facility];  Service: Endoscopy;  Laterality: N/A;  . IR ANGIO VERTEBRAL SEL SUBCLAVIAN INNOMINATE UNI R MOD SED  06/03/2017  . IR GASTROSTOMY TUBE MOD SED  06/25/2017  . IR GASTROSTOMY TUBE REMOVAL  08/20/2017  . IR PERCUTANEOUS ART THROMBECTOMY/INFUSION INTRACRANIAL INC DIAG ANGIO  06/03/2017  . IR PERCUTANEOUS ART THROMBECTOMY/INFUSION INTRACRANIAL INC DIAG ANGIO  06/03/2017  . IR RADIOLOGIST EVAL & MGMT  09/07/2017  . LUMBAR PERITONEAL SHUNT     x 2  . OVARIAN CYST REMOVAL     age 76  . RADIOLOGY WITH ANESTHESIA N/A 06/03/2017   Procedure: RADIOLOGY WITH ANESTHESIA;  Surgeon: Luanne Bras, MD;  Location: [facility];  Service: Radiology;  Laterality: N/A;  . SHUNT REVISION  2007  . TOTAL ABDOMINAL HYSTERECTOMY W/ BILATERAL SALPINGOOPHORECTOMY  1994   age 26s  . WRIST SURGERY Left 2014    [name]   HPI:  [name] is a 67 y.o. female with medical history significant for DM 2, CVA with right sided deficits and dysphagia requiring PEG tube, anemia, GERD, HLD, HTN seizures disorder, chronic refractory headaches, debility, sp PEG and Trach now removed. Admitted with decreased responsiveness. MRI showed acute and subacute infarcts in the left hemisphere in the left MCA distribution raising possibility of embolic source. Note that basedon 05/2018 OP SLP therapy documentation, patient tolerating a regular diet, thin liquids.    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  Expression is fluent, but slow with equal emphasis on all syllables.  Expression is characterized by intermittent deficits in word-retrieval, but pt is effectively able to convey her ideas with slow, deliberate output.  She has occasional phonemic errors and has difficulty with repetition at the sentence level.  Comprehension is functional for simple conversation, one-step commands, but breaks down with increased complexity.  Overall baseline cognition/language is unknown, no family available during assessment.  Pt appropriately emotional today, verbalizing her frustration over what she describes as a "backslide" after all the progress she made this year after her initial stroke. Pt would benefit from ongoing aphasia/speech therapy and dysphagia management to facilitate independence as much as able.       SLP Assessment  SLP Recommendation/Assessment: Patient needs continued Speech Lanaguage Pathology Services SLP Visit Diagnosis: Cognitive communication deficit (R41.841)    Follow Up Recommendations  Inpatient Rehab    Frequency and Duration min 2x/week  2 weeks      SLP Evaluation Cognition  Overall Cognitive Status: Difficult to assess Arousal/Alertness: Awake/alert Orientation Level: Oriented X4 Attention: Sustained Sustained Attention: Appears intact       Comprehension  Auditory Comprehension Overall Auditory Comprehension: Impaired Yes/No Questions: Impaired Complex Questions: 25-49% accurate Commands: Impaired Two Step Basic Commands: 50-74% accurate Conversation: Simple Interfering Components: Pain EffectiveTechniques: Extra processing time Reading Comprehension Reading Status: Not tested    Expression Expression Primary Mode of Expression: Verbal Verbal Expression Overall Verbal Expression: Impaired Initiation: No impairment Automatic Speech: Counting;Day of week(wnl) Level of Generative/Spontaneous Verbalization: Sentence Repetition: Impaired Level  of Impairment: Sentence level Naming: Impairment Responsive: 76-100% accurate Confrontation: Impaired Convergent: 75-100% accurate Verbal Errors: Phonemic paraphasias Pragmatics: No impairment Written Expression Dominant Hand: Left Written Expression: Not tested   Oral / Motor  Oral Motor/Sensory Function Overall Oral Motor/Sensory Function: Generalized oral weakness Motor Speech Overall Motor Speech: Impaired Respiration: Within functional limits Phonation: Hoarse Resonance: Within functional limits Articulation: Impaired Level of Impairment: Sentence Intelligibility: Intelligibility reduced Sentence: 75-100% accurate Motor Planning: Impaired Level of Impairment: Sentence   GO                    Juan Quam Laurice 07/15/2018, 11:13 AM  Estill Bamberg L. Tivis Ringer, [place] Office number [phone]

## 2018-07-15 NOTE — Progress Notes (Signed)
Physical Therapy Treatment Patient Details Name: [name] MRN: [id number] DOB: [birth year] Today's Date: 07/15/2018    History of Present Illness Patient is a 67 y/o female presenting to the ED on 07/13/18 with primary complaints of difficulty with speech. Patient with a PMH significant for DM 2, CVA, anemia, GERD, HLD, HTN seizures disorder.Pseudotumor cerebri, chronic refractory headaches, debility, sp PEG and Trach now removed. Initial CT head unremarkable. MRI revealing Acute multifocal LEFT frontoparietal/MCA territory infarcts.  Spouse reports carotid endarterectomy is planned this Monday.     PT Comments    Patient seen for mobility progression. Pt is making gradual progress toward PT goals. Limited by R foot pain with weight bearing. Pt requires +2 assist for gait with RW and +2 assist for bed mobility and functional transfers. Continue to progress as tolerated.    Follow Up Recommendations  CIR     Equipment Recommendations  None recommended by PT    Recommendations for Other Services Rehab consult;OT consult     Precautions / Restrictions Precautions Precautions: Fall Precaution Comments: highly fearful of falling Restrictions Weight Bearing Restrictions: No Other Position/Activity Restrictions: husband reporting a fall in the home recently - noted yellow/purple bruising to R foot    Mobility  Bed Mobility Overal bed mobility: Needs Assistance Bed Mobility: Supine to Sit     Supine to sit: Min assist     General bed mobility comments: assist to elevate trunk into sitting  Transfers Overall transfer level: Needs assistance Equipment used: None Transfers: Sit to/from Stand Sit to Stand: Min assist         General transfer comment: assist to power up into standing and for balance in standing; pt needs help to bring R UE onto RW due to limited R shoulder ROM and pain  Ambulation/Gait Ambulation/Gait assistance: Min assist;+2 physical assistance;Mod  assist Gait Distance (Feet): 10 Feet Assistive device: Rolling walker (2 wheeled) Gait Pattern/deviations: Step-to pattern;Decreased step length - right;Decreased step length - left;Decreased weight shift to right;Decreased dorsiflexion - right;Narrow base of support;Trunk flexed Gait velocity: slow   General Gait Details: cues for sequencing, posture, and use of AD; assistance for balance and guiding RW espeically on R side   Stairs             Wheelchair Mobility    Modified Rankin (Stroke Patients Only) Modified Rankin (Stroke Patients Only) Pre-Morbid Rankin Score: Slight disability Modified Rankin: Moderately severe disability     Balance Overall balance assessment: Needs assistance Sitting-balance support: Feet supported;Single extremity supported Sitting balance-Leahy Scale: Fair     Standing balance support: Bilateral upper extremity supported;During functional activity Standing balance-Leahy Scale: Poor                              Cognition Arousal/Alertness: Awake/alert Behavior During Therapy: Anxious;Flat affect Overall Cognitive Status: Difficult to assess Area of Impairment: Attention;Memory;Following commands;Safety/judgement;Awareness;Problem solving                   Current Attention Level: Selective Memory: Decreased short-term memory Following Commands: Follows one step commands inconsistently;Follows one step commands with increased time Safety/Judgement: Decreased awareness of safety Awareness: Emergent Problem Solving: Slow processing;Decreased initiation;Difficulty sequencing;Requires verbal cues;Requires tactile cues General Comments: spouse reports baseline-at time of eval(not present this session)      Exercises      General Comments        Pertinent Vitals/Pain Pain Assessment: Faces Faces Pain Scale: Hurts even  more Pain Location: R foot with weight bearing(associated with voiding) Pain Descriptors /  Indicators: Grimacing;Moaning Pain Intervention(s): Limited activity within patient's tolerance;Monitored during session;Repositioned    Home Living     Available Help at Discharge: Family;Available PRN/intermittently                Prior Function            PT Goals (current goals can now be found in the care plan section) Acute Rehab PT Goals Patient Stated Goal: spouse wants pt to go to rehab  Progress towards PT goals: Progressing toward goals    Frequency    Min 4X/week      PT Plan Current plan remains appropriate    Co-evaluation              AM-PAC PT "6 Clicks" Daily Activity  Outcome Measure  Difficulty turning over in bed (including adjusting bedclothes, sheets and blankets)?: A Lot Difficulty moving from lying on back to sitting on the side of the bed? : Unable Difficulty sitting down on and standing up from a chair with arms (e.g., wheelchair, bedside commode, etc,.)?: Unable Help needed moving to and from a bed to chair (including a wheelchair)?: A Little Help needed walking in hospital room?: A Lot Help needed climbing 3-5 steps with a railing? : A Lot 6 Click Score: 11    End of Session Equipment Utilized During Treatment: Gait belt Activity Tolerance: Patient limited by pain(R foot) Patient left: with call bell/phone within reach;in chair Nurse Communication: Mobility status PT Visit Diagnosis: Unsteadiness on feet (R26.81);Other abnormalities of gait and mobility (R26.89);History of falling (Z91.81)     Time: 1100-1120 PT Time Calculation (min) (ACUTE ONLY): 20 min  Charges:  $Gait Training: 8-22 mins                     Earney Navy, PTA Acute Rehabilitation Services Pager: [phone] Office: [phone]     Darliss Cheney 07/15/2018, 11:40 AM

## 2018-07-15 NOTE — Progress Notes (Signed)
  Speech Language Pathology Treatment: Dysphagia  Patient Details Name: [name] MRN: [id number] DOB: 04/21/1951 Today's Date: 07/15/2018 Time: 1000-1010 SLP Time Calculation (min) (ACUTE ONLY): 10 min  Assessment / Plan / Recommendation Clinical Impression  Received call from radiology that pt had refused MBS this am.  Had conversation with pt in an effort to encourage participation, but she adamantly and politely declined.  Initially reluctant to attempt PO trials, but ultimately was willing to drink some thin liquids and eat applesauce - required much encouragement.  There was initial coughing after consuming thin liquids, but this subsided as PO trials progressed.  She accepted limited amounts of applesauce with adequate oral manipulation, no overt s/s of difficulty.  Recommend beginning a full liquid diet; please provide full supervision to assist with self-feeding; if s/s of aspiration persist, please page SLP and we will pursue efforts at [facility]. Pt verbalizes agreement; D/W RN.      HPI HPI: [name] is a 67 y.o. female with medical history significant of DM 2, CVA with right sided deficits and dysphagia requiring PEG tube, anemia, GERD, HLD, HTN seizures disorder, chronic refractory headaches, debility, sp PEG and Trach now removed. Admitted with decreased responsiveness. MRI showed acute and subacute infarcts in the left hemisphere in the left MCA distribution raising possibility of embolic source. Note that basedon 05/2018 OP SLP therapy documentation, patient tolerating a regular diet, thin liquids.       SLP Plan  Continue with current plan of care       Recommendations  Diet recommendations: Thin liquid(full liquid diet) Liquids provided via: Cup;No straw Medication Administration: Whole meds with puree Supervision: Full supervision/cueing for compensatory strategies;Staff to assist with self feeding Compensations: Minimize environmental distractions;Slow  rate;Small sips/bites Postural Changes and/or Swallow Maneuvers: Seated upright 90 degrees                Oral Care Recommendations: Oral care BID Follow up Recommendations: Inpatient Rehab SLP Visit Diagnosis: Dysphagia, unspecified (R13.10) Plan: Continue with current plan of care       GO                Juan Quam Laurice 07/15/2018, 10:43 AM

## 2018-07-16 ENCOUNTER — Inpatient Hospital Stay (HOSPITAL_COMMUNITY): Payer: Medicare Other

## 2018-07-16 LAB — CBC
HEMATOCRIT: 36.3 % (ref 36.0–46.0)
HEMOGLOBIN: 11.7 g/dL — AB (ref 12.0–15.0)
MCH: 28 pg (ref 26.0–34.0)
MCHC: 32.2 g/dL (ref 30.0–36.0)
MCV: 86.8 fL (ref 78.0–100.0)
Platelets: 241 10*3/uL (ref 150–400)
RBC: 4.18 MIL/uL (ref 3.87–5.11)
RDW: 14.7 % (ref 11.5–15.5)
WBC: 7.1 10*3/uL (ref 4.0–10.5)

## 2018-07-16 LAB — HEPARIN LEVEL (UNFRACTIONATED)
Heparin Unfractionated: 0.76 IU/mL — ABNORMAL HIGH (ref 0.30–0.70)
Heparin Unfractionated: 0.38 IU/mL (ref 0.30–0.70)

## 2018-07-16 LAB — GLUCOSE, CAPILLARY
GLUCOSE-CAPILLARY: 92 mg/dL (ref 70–99)
GLUCOSE-CAPILLARY: 99 mg/dL (ref 70–99)
GLUCOSE-CAPILLARY: 117 mg/dL — AB (ref 70–99)
Glucose-Capillary: 94 mg/dL (ref 70–99)
Glucose-Capillary: 103 mg/dL — ABNORMAL HIGH (ref 70–99)
Glucose-Capillary: 104 mg/dL — ABNORMAL HIGH (ref 70–99)
Glucose-Capillary: 137 mg/dL — ABNORMAL HIGH (ref 70–99)

## 2018-07-16 MED ORDER — METOPROLOL TARTRATE 5 MG/5ML IV SOLN
5.0000 mg | Freq: Once | INTRAVENOUS | Status: AC
  Administered 2018-07-16: 5 mg via INTRAVENOUS
  Filled 2018-07-16: qty 5

## 2018-07-16 MED ORDER — LEVETIRACETAM IN NACL 1000 MG/100ML IV SOLN
1000.0000 mg | INTRAVENOUS | Status: AC
  Administered 2018-07-17: 1000 mg via INTRAVENOUS
  Filled 2018-07-16: qty 100

## 2018-07-16 MED ORDER — INSULIN GLARGINE 100 UNIT/ML ~~LOC~~ SOLN
10.0000 [IU] | Freq: Every day | SUBCUTANEOUS | Status: DC
  Administered 2018-07-16 – 2018-07-23 (×6): 10 [IU] via SUBCUTANEOUS
  Filled 2018-07-16 (×8): qty 0.1

## 2018-07-16 MED ORDER — LORAZEPAM 2 MG/ML IJ SOLN
INTRAMUSCULAR | Status: AC
  Filled 2018-07-16: qty 1

## 2018-07-16 MED ORDER — LORAZEPAM 2 MG/ML IJ SOLN
1.0000 mg | INTRAMUSCULAR | Status: AC
  Administered 2018-07-16 (×2): 1 mg via INTRAVENOUS
  Filled 2018-07-16: qty 1

## 2018-07-16 MED ORDER — LORAZEPAM 2 MG/ML IJ SOLN
1.0000 mg | INTRAMUSCULAR | Status: AC
  Administered 2018-07-17 (×2): 1 mg via INTRAVENOUS
  Filled 2018-07-16: qty 1

## 2018-07-16 NOTE — Progress Notes (Addendum)
Neurology Progress Note   S:// Recall for seizure-like activity. Patient seen by stroke team during this admission for left cerebral hemisphere stroke secondary to symptomatic left internal carotid.  Endarterectomy planned for 10/1.   Patient had been in her usual state of health most of the day today but around 9 PM started having some uncontrolled spontaneous activity of her right upper extremity.  This was followed by some twitching of her eyes and twitching of her face followed by whole body jerking. She was sent in for a stat CT head by the primary team. Neurology was called when she became less responsive after coming back from the CT scan.  O:// Current vital signs: BP (!) 126/91   Pulse (!) 124   Temp 98.6 F (37 C) (Oral)   Resp (!) 22   Ht 5\' 2"  (1.575 m)   Wt 59 kg   SpO2 96%   BMI 23.78 kg/m  Vital signs in last 24 hours: Temp:  [97.5 F (36.4 C)-98.9 F (37.2 C)] 98.6 F (37 C) (09/28 2328) Pulse Rate:  [101-124] 124 (09/28 2328) Resp:  [16-22] 22 (09/28 2124) BP: (126-194)/(91-110) 126/91 (09/28 2328) SpO2:  [91 %-99 %] 96 % (09/28 2328) General: Patient is drowsy, opens eyes to voice intermittently, in no apparent distress HEENT: Normocephalic atraumatic Lungs: Clear to auscultation Cardiovascular: S1-S2 heard regular rhythm Abdomen: Soft nondistended nontender Neurological exam Mental status: Drowsy, opens eyes to voice intermittently.  Follows some commands.  Is nonverbal. Cranial nerves: Pupils equal round reactive to light, right gaze preference which broke with Ativan, does not blink to threat from either side, right facial droop Motor exam: Is antigravity in both upper extremities and is able to wiggle toes on both lower extremities. Sensory exam: Intact to noxious stim all over Coordination: Unable to perform finger-nose-finger heel-knee-shin. Gait testing was deferred at this time  There was spontaneous twitching of the eyes and right face along with  right arm and hand jerking that was very rhythmic.  No twitching of the legs noted.  Medications  Current Facility-Administered Medications:  .  0.9 %  sodium chloride infusion, , Intravenous, Continuous, Emokpae, Courage, MD, Last Rate: 20 mL/hr at 07/15/18 2031 .  acetaminophen (TYLENOL) tablet 650 mg, 650 mg, Oral, Q4H PRN, 650 mg at 07/16/18 2056 **OR** acetaminophen (TYLENOL) solution 650 mg, 650 mg, Per Tube, Q4H PRN **OR** acetaminophen (TYLENOL) suppository 650 mg, 650 mg, Rectal, Q4H PRN, Doutova, Anastassia, MD .  gemfibrozil (LOPID) tablet 600 mg, 600 mg, Oral, BID AC, Emokpae, Courage, MD, 600 mg at 07/16/18 1858 .  heparin ADULT infusion 100 units/mL (25000 units/225mL sodium chloride 0.45%), 500 Units/hr, Intravenous, Continuous, BellOliver Hum, RPH, Last Rate: 5 mL/hr at 07/16/18 0623, 500 Units/hr at 07/16/18 0623 .  insulin aspart (novoLOG) injection 0-9 Units, 0-9 Units, Subcutaneous, Q4H, Doutova, Anastassia, MD .  insulin glargine (LANTUS) injection 10 Units, 10 Units, Subcutaneous, QHS, Nita Sells, MD, 10 Units at 07/16/18 2307 .  levETIRAcetam (KEPPRA) IVPB 1000 mg/100 mL premix, 1,000 mg, Intravenous, STAT, Amie Portland, MD .  LORazepam (ATIVAN) injection 1 mg, 1 mg, Intravenous, Q20 Min, Amie Portland, MD .  ondansetron [facility]) injection 4 mg, 4 mg, Intravenous, Q6H PRN, Bodenheimer, Charles A, NP, 4 mg at 07/14/18 2347 Labs CBC    Component Value Date/Time   WBC 7.1 07/16/2018 0429   RBC 4.18 07/16/2018 0429   HGB 11.7 (L) 07/16/2018 0429   HCT 36.3 07/16/2018 0429   PLT 241 07/16/2018 0429  MCV 86.8 07/16/2018 0429   MCH 28.0 07/16/2018 0429   MCHC 32.2 07/16/2018 0429   RDW 14.7 07/16/2018 0429   LYMPHSABS 2.3 07/13/2018 1109   MONOABS 0.7 07/13/2018 1109   EOSABS 0.2 07/13/2018 1109   BASOSABS 0.1 07/13/2018 1109    CMP     Component Value Date/Time   NA 141 07/14/2018 0805   NA 137 01/15/2016 1632   K 3.6 07/14/2018 0805   CL 111  07/14/2018 0805   CO2 21 (L) 07/14/2018 0805   GLUCOSE 95 07/14/2018 0805   BUN 16 07/14/2018 0805   BUN 14 01/15/2016 1632   CREATININE 0.88 07/14/2018 0805   CALCIUM 9.0 07/14/2018 0805   PROT 7.2 07/13/2018 1109   ALBUMIN 4.0 07/13/2018 1109   AST 27 07/13/2018 1109   ALT 23 07/13/2018 1109   ALKPHOS 121 07/13/2018 1109   BILITOT 0.4 07/13/2018 1109   GFRNONAA >60 07/14/2018 0805   GFRAA >60 07/14/2018 0805    Imaging I have reviewed images in epic and the results pertinent to this consultation are: CT-scan of the brain-acute on chronic left MCA stroke, other chronic strokes as noted in the formal report.  No bleed.  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 Left ICA showed 60% stenosis and thrombus protruding into the lumen. She has been started on heparin drip and has been on it since then. She became acutely altered with what sounded like a focal right upper extremity seizure progressing to secondary generalization. She was given multiple doses of Ativan-1 mg IV x4 with some resolution of the rhythmic shaking of her right upper extremity and facial twitching and it also broke the gaze deviation that she was able to later cross midline and look to the left but she continued to have some rhythmic jerking of her right wrist. Keppra 1 g IV also ordered stat.  Impression - Seizure secondary to recent stroke in the left MCA/PCA territory-greater than 5 minutes duration and multiple seizures without returning back to baseline consistent with status epilepticus clinically. - Recent large vessel artery to  artery embolic stroke-currently on heparin - Toxic metabolic encephalopathy - Pseudotumor cerebri status post lumboperitoneal shunt.  Recommendations: Stat CT head unremarkable If the patient's clinical seizure activity does not resolve with the benzodiazepines and Keppra, will consider loading with Dilantin. If the first-line therapies fail, would consider intubation and treatment with Versed drip/propofol drip. Currently full code and her husband is on board with full level of care. Labs drawn and pending.  Correct toxic metabolic derangements if present.  -- Amie Portland, MD [facility] Pager: [phone] If 7pm to 7am, please call on call as listed on AMION.  CRITICAL CARE ATTESTATION This patient is critically ill and at significant risk of neurological worsening, death and care requires constant monitoring of vital signs, hemodynamics, respiratory, and cardiac monitoring. I spent 40  minutes of neurocritical care time performing neurological assessment, discussion with family, other specialists and medical decision making of high complexity in the care of  this patient.  Addendum Patient is allergic to Dilantin Depakote 1250 mg x once added to meds. We will continue to follow

## 2018-07-16 NOTE — Plan of Care (Signed)
  Problem: Coping: Goal: Ability to identify and develop effective coping behavior will improve Outcome: Progressing Goal: Ability to interact with others will improve Outcome: Progressing Goal: Demonstration of participation in decision-making regarding own care will improve Outcome: Progressing Goal: Ability to use eye contact when communicating with others will improve Outcome: Progressing   Problem: Education: Goal: Knowledge of General Education information will improve Description Including pain rating scale, medication(s)/side effects and non-pharmacologic comfort measures Outcome: Progressing   Problem: Health Behavior/Discharge Planning: Goal: Ability to manage health-related needs will improve Outcome: Progressing   Problem: Clinical Measurements: Goal: Ability to maintain clinical measurements within normal limits will improve Outcome: Progressing Goal: Will remain free from infection Outcome: Progressing Goal: Diagnostic test results will improve Outcome: Progressing Goal: Respiratory complications will improve Outcome: Progressing Goal: Cardiovascular complication will be avoided Outcome: Progressing   Problem: Activity: Goal: Risk for activity intolerance will decrease Outcome: Progressing   Problem: Nutrition: Goal: Adequate nutrition will be maintained Outcome: Progressing   Problem: Coping: Goal: Level of anxiety will decrease Outcome: Progressing   Problem: Elimination: Goal: Will not experience complications related to bowel motility Outcome: Progressing Goal: Will not experience complications related to urinary retention Outcome: Progressing   Problem: Pain Managment: Goal: General experience of comfort will improve Outcome: Progressing   Problem: Safety: Goal: Ability to remain free from injury will improve Outcome: Progressing   Problem: Skin Integrity: Goal: Risk for impaired skin integrity will decrease Outcome: Progressing

## 2018-07-16 NOTE — Consult Note (Signed)
ANTICOAGULATION CONSULT NOTE  Pharmacy Consult for Heparin  Indication: stroke  Labs: Recent Labs    07/13/18 1109 07/13/18 1125 07/14/18 0805 07/14/18 2134 07/15/18 0535 07/16/18 0429  HGB 13.8 15.0 12.4  --  11.0* 11.7*  HCT 44.7 44.0 39.3  --  35.1* 36.3  PLT 324  --  259  --  220 241  APTT 22*  --   --   --   --   --   LABPROT 13.1  --   --   --   --   --   INR 1.00  --   --   --   --   --   HEPARINUNFRC  --   --   --  0.49 0.70 0.76*  CREATININE 1.27* 1.40* 0.88  --   --   --     Estimated Creatinine Clearance: 49.7 mL/min (by C-G formula based on SCr of 0.88 mg/dL).    Assessment: 67 YO female requiring heparin anticoagulation for acute CVA.  Heparin level = 0.78 supratherapeutic on 650 units/hr. Per pt, the blood was drawn from opposite arm  HgB = 11.7, pltc 241K stable  Carotid endarterectomy planned for 10/1  Goal of Therapy:  Heparin level 0.3-0.5 units/ml Monitor platelets by anticoagulation protocol: Yes   Plan:  Decrease heparin to 500 units / hr Recheck heparin level at 1230 Daily heparin level, CBC  Thank you  Maryanna Shape, PharmD, BCPS, BCPPS Clinical Pharmacist  Pager: [phone]    07/16/2018,6:02 AM

## 2018-07-16 NOTE — Progress Notes (Signed)
Patient Demographics:    [name], is a 67 y.o. female, DOB - 02/28/1951, ZTI:[id number]  Admit date - 07/13/2018   Admitting Physician Toy Baker, MD  Outpatient Primary MD for the patient is [name], MD  LOS - 3   No chief complaint on file.       Subjective:   Awake alert pleasan tin nad Trying to manipulate spoon to eat ice cream--maging only fairly No fever no chills no new deficit coherent    Assessment  & Plan :    Active Problems:   Acute CVA (cerebrovascular accident) ([place])   Type II diabetes mellitus with complication (HCC)   Benign essential HTN   Hypokalemia   CVA (cerebral vascular accident) ([place])   Diabetes mellitus ([place])  Brief summary  67 y.o. female with medical history significant of DM 2, CVA, anemia, GERD, HLD, HTN seizures disorder.Pseudotumor cerebri, chronic refractory headaches, debility,  Prolonged hospitalization 8/16 secondary to stroke-at that time found to have less than 50% L-ICA stenosis-while undergoing cerebral angiogram became unresponsive bilateral you E tonic posturing left-sided weakness patient had revascularization emergently and was found to have multiple new infratentorial infarcts at that time-she was difficult to wean off the ventilator and subsequently developed MSSA bronchitis versus pneumonia requiring tracheostomy and was eventually sent to CIR discharge 07/07/2017--was decannulated by pulmonary team Return to hospital and admitted on 07/13/2018 with unresponsive episode and found to have acute stroke,  left ICA bifurcation calcified plaque and soft plaque with thrombus protruding into the lumen    Plan:-  1)Acute multifocal Lt frontoparietal/Lt MCA Infarcts--CTA head and neck shows  left ICA bifurcation calcified plaque and soft plaque with thrombus protruding into the lumen,   direct LDL 132, triglycerides over 420  ,  intolerant to statin drugs consider PCSK9 inhibitors as outpatient, patient apparently is intolerant to Zetia as well, will give Lopid for hypertriglyceridemia.   Will get Vasc intervention 07/20/18, continue IV heparin as recommended by neurologist, echo with preserved EF over 65% no intracardiac thrombus identified.   Will need  left ICA CEA procedure per vascular surgery on 07/20/2018 IV heparin will be discontinued and patient may be restarted on Plavix.    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  Post left ICA CEA procedure well vascular surgery on 07/20/2018 IV heparin will be discontinued and patient may be restarted on Plavix  Code Status : Full code   Disposition Plan  : TBD --- possibly CIR  Consults  : Neurology and vascular surgery  DVT Prophylaxis  : IV heparin Lab Results  Component Value Date   PLT 241 07/16/2018    Inpatient Medications  Scheduled Meds: . gemfibrozil  600 mg Oral BID AC  . insulin aspart  0-9 Units Subcutaneous Q4H  . insulin glargine  15 Units Subcutaneous QHS   Continuous Infusions: . sodium chloride 20 mL/hr at 07/15/18 2031  . heparin 500 Units/hr (07/16/18  0623)   PRN Meds:.acetaminophen **OR** acetaminophen (TYLENOL) oral liquid 160 mg/5 mL **OR** acetaminophen, ondansetron (ZOFRAN) IV    Anti-infectives (From admission, onward)   None        Objective:   Vitals:   07/15/18 1554 07/15/18 2000 07/15/18 2345 07/16/18 0338  BP: (!) 165/78 (!) 159/96 (!) 170/94 (!) 168/101  Pulse: (!) 106 (!) 106 (!) 108 (!) 101  Resp: 20 18 20 20   Temp: 98.4 F (36.9 C) 99.1 F (37.3 C) 98.8 F (37.1 C) 98.4 F (36.9 C)  TempSrc: Oral Oral Oral Oral  SpO2: 99% 97% 99% 98%  Weight:      Height:        Wt Readings from Last 3 Encounters:  07/13/18 59 kg  05/18/18 61.7 kg  07/13/17 78 kg     Intake/Output Summary (Last 24 hours) at 07/16/2018 0720 Last data filed at 07/16/2018 0351 Gross per 24 hour  Intake 240 ml  Output 1800 ml  Net -1560 ml     Physical Exam  Awake pleasant but flat affect-slighlty labile in nad No ict no pallor s1 s2 no m/r/g abd soft nt nd no rebound poer 5/5 poor effort Flexure deformities RUE with some difficulty manipulating R hand and spoon Smile symm No pallior no ict   Data Review:   Micro Results No results found for this or any previous visit (from the past 240 hour(s)).  Radiology Reports Ct Angio Head W Or Wo Contrast  Result Date: 07/14/2018 CLINICAL DATA:  Recent infarcts. EXAM: CT ANGIOGRAPHY HEAD AND NECK TECHNIQUE: Multidetector CT imaging of the head and neck was performed using the standard protocol during bolus administration of intravenous contrast. Multiplanar CT image reconstructions and MIPs were obtained to evaluate the vascular anatomy. Carotid stenosis measurements (when applicable) are obtained utilizing NASCET criteria, using the distal internal carotid diameter as the denominator. CONTRAST:  76mL ISOVUE-370 IOPAMIDOL (ISOVUE-370) INJECTION 76% COMPARISON:  Brain MRI 07/13/2018 CTA head 06/23/2017 CTA neck and head 06/03/2017 FINDINGS: CTA NECK FINDINGS AORTIC ARCH: There is mild  calcific atherosclerosis of the aortic arch. There is no aneurysm, dissection or hemodynamically significant stenosis of the visualized ascending aorta and aortic arch. Conventional 3 vessel aortic branching pattern. The visualized proximal subclavian arteries are widely patent. RIGHT CAROTID SYSTEM: --Common carotid artery: Widely patent origin without common carotid artery dissection or aneurysm. --Internal carotid artery: No dissection, occlusion or aneurysm. Mild atherosclerotic calcification at the carotid bifurcation without hemodynamically significant stenosis. --External carotid artery: No acute abnormality. LEFT CAROTID SYSTEM: --Common carotid artery: Widely patent origin without common carotid artery dissection or aneurysm. --Internal carotid artery:There is eccentric fibrofatty plaque within the proximal internal carotid artery that causes a stenosis of approximately 60% by NASCET criteria. The distal internal carotid artery is widely patent. --External carotid artery: No acute abnormality. VERTEBRAL ARTERIES: Left dominant configuration. Both origins are normal. No dissection, occlusion or flow-limiting stenosis to the vertebrobasilar confluence. SKELETON: There is no bony spinal canal stenosis. No lytic or blastic lesion. OTHER NECK: Normal pharynx, larynx and major salivary glands. No cervical lymphadenopathy. Unremarkable thyroid  gland. UPPER CHEST: No pneumothorax or pleural effusion. No nodules or masses. CTA HEAD FINDINGS ANTERIOR CIRCULATION: --Intracranial internal carotid arteries: Atherosclerotic calcification of the internal carotid arteries at the skull base without hemodynamically significant stenosis. --Anterior cerebral arteries: Short segment high-grade stenosis versus occlusion of the proximal left A1 segment is unchanged. The anterior communicating artery is patent. Right A1 segment is normal. The A2 segments and distal branches are normal otherwise. The left MCA is normal. --Posterior  communicating arteries: Present bilaterally. POSTERIOR CIRCULATION: --Basilar artery: Normal. --Posterior cerebral arteries: Normal. --Superior cerebellar arteries: Normal. --Inferior cerebellar arteries: Normal anterior and posterior inferior cerebellar arteries. VENOUS SINUSES: As permitted by contrast timing, patent. ANATOMIC VARIANTS: None DELAYED PHASE: No parenchymal contrast enhancement. Redemonstration of acute or subacute infarct in the left frontoparietal MCA territory. Review of the MIP images confirms the above findings. IMPRESSION: 1. No emergent large vessel occlusion. 2. Approximately 60% stenosis of the left internal carotid artery by NASCET measurements. The eccentric appearance of the low density plaque within the lumen is unchanged. 3. Severe stenosis of the proximal left A1 segment, slightly worsened. 4. Mild narrowing of the distal portion of the right MCA M1 segment. 5. Acute/subacute infarct of the left frontoparietal MCA territory. 6.  Aortic Atherosclerosis (ICD10-I70.0). Electronically Signed   By: Ulyses Jarred M.D.   On: 07/14/2018 14:08   Dg Chest 2 View  Result Date: 07/13/2018 CLINICAL DATA:  Generalized weakness.  Hypertension. EXAM: CHEST - 2 VIEW COMPARISON:  June 23, 2017 FINDINGS: There is mild scarring in the left base with questionable small left pleural effusion. Lungs elsewhere clear. Heart is mildly enlarged with pulmonary vascularity normal. No adenopathy. There is aortic atherosclerosis. No evident bone lesions. IMPRESSION: Mild scarring left base with questionable small left pleural effusion. Lungs elsewhere clear. Mild cardiac prominence. There is aortic atherosclerosis. Aortic Atherosclerosis (ICD10-I70.0). Electronically Signed   By: Lowella Grip III M.D.   On: 07/13/2018 13:13   Ct Head Wo Contrast  Result Date: 07/13/2018 CLINICAL DATA:  Altered mental status EXAM: CT HEAD WITHOUT CONTRAST TECHNIQUE: Contiguous axial images were obtained from the base  of the skull through the vertex without intravenous contrast. COMPARISON:  06/23/2017, 06/04/2017 FINDINGS: Brain: Brain atrophy noted. Chronic white matter microvascular changes. Progressive subcortical white matter scattered hypodensities throughout both cerebral hemispheres, worse in the left frontal lobe compatible with evolutionary changes of previous multifocal infarcts from 06/04/2017. No acute intracranial hemorrhage, mass lesion, definite new infarction, midline shift, herniation, hydrocephalus, or extra-axial fluid collection. No focal mass effect or edema. Cisterns are patent. Cerebellar atrophy as well. Vascular: Intracranial atherosclerosis.  No hyperdense vessel. Skull: Normal. Negative for fracture or focal lesion. Sinuses/Orbits: No acute finding. Other: None. IMPRESSION: Chronic brain atrophy and white matter microvascular ischemic changes. Evolutionary changes of multifocal subcortical infarcts bilaterally, largest in the high left frontal lobe when compared 06/04/2017 MRI. No acute intracranial hemorrhage. Electronically Signed   By: Jerilynn Mages.  Shick M.D.   On: 07/13/2018 13:07   Ct Angio Neck W Or Wo Contrast  Result Date: 07/14/2018 CLINICAL DATA:  Recent infarcts. EXAM: CT ANGIOGRAPHY HEAD AND NECK TECHNIQUE: Multidetector CT imaging of the head and neck was performed using the standard protocol during bolus administration of intravenous contrast. Multiplanar CT image reconstructions and MIPs were obtained to evaluate the vascular anatomy. Carotid stenosis measurements (when applicable) are obtained utilizing NASCET criteria, using the distal internal carotid diameter as the denominator. CONTRAST:  31mL ISOVUE-370 IOPAMIDOL (ISOVUE-370) INJECTION 76% COMPARISON:  Brain MRI 07/13/2018 CTA  head 06/23/2017 CTA neck and head 06/03/2017 FINDINGS: CTA NECK FINDINGS AORTIC ARCH: There is mild calcific atherosclerosis of the aortic arch. There is no aneurysm, dissection or hemodynamically significant  stenosis of the visualized ascending aorta and aortic arch. Conventional 3 vessel aortic branching pattern. The visualized proximal subclavian arteries are widely patent. RIGHT CAROTID SYSTEM: --Common carotid artery: Widely patent origin without common carotid artery dissection or aneurysm. --Internal carotid artery: No dissection, occlusion or aneurysm. Mild atherosclerotic calcification at the carotid bifurcation without hemodynamically significant stenosis. --External carotid artery: No acute abnormality. LEFT CAROTID SYSTEM: --Common carotid artery: Widely patent origin without common carotid artery dissection or aneurysm. --Internal carotid artery:There is eccentric fibrofatty plaque within the proximal internal carotid artery that causes a stenosis of approximately 60% by NASCET criteria. The distal internal carotid artery is widely patent. --External carotid artery: No acute abnormality. VERTEBRAL ARTERIES: Left dominant configuration. Both origins are normal. No dissection, occlusion or flow-limiting stenosis to the vertebrobasilar confluence. SKELETON: There is no bony spinal canal stenosis. No lytic or blastic lesion. OTHER NECK: Normal pharynx, larynx and major salivary glands. No cervical lymphadenopathy. Unremarkable thyroid gland. UPPER CHEST: No pneumothorax or pleural effusion. No nodules or masses. CTA HEAD FINDINGS ANTERIOR CIRCULATION: --Intracranial internal carotid arteries: Atherosclerotic calcification of the internal carotid arteries at the skull base without hemodynamically significant stenosis. --Anterior cerebral arteries: Short segment high-grade stenosis versus occlusion of the proximal left A1 segment is unchanged. The anterior communicating artery is patent. Right A1 segment is normal. The A2 segments and distal branches are normal otherwise. The left MCA is normal. --Posterior communicating arteries: Present bilaterally. POSTERIOR CIRCULATION: --Basilar artery: Normal. --Posterior  cerebral arteries: Normal. --Superior cerebellar arteries: Normal. --Inferior cerebellar arteries: Normal anterior and posterior inferior cerebellar arteries. VENOUS SINUSES: As permitted by contrast timing, patent. ANATOMIC VARIANTS: None DELAYED PHASE: No parenchymal contrast enhancement. Redemonstration of acute or subacute infarct in the left frontoparietal MCA territory. Review of the MIP images confirms the above findings. IMPRESSION: 1. No emergent large vessel occlusion. 2. Approximately 60% stenosis of the left internal carotid artery by NASCET measurements. The eccentric appearance of the low density plaque within the lumen is unchanged. 3. Severe stenosis of the proximal left A1 segment, slightly worsened. 4. Mild narrowing of the distal portion of the right MCA M1 segment. 5. Acute/subacute infarct of the left frontoparietal MCA territory. 6.  Aortic Atherosclerosis (ICD10-I70.0). Electronically Signed   By: Ulyses Jarred M.D.   On: 07/14/2018 14:08   Mr Brain Wo Contrast  Result Date: 07/13/2018 CLINICAL DATA:  Altered mental status, somnolent. History of stroke and RIGHT-sided weakness. History of seizures, pseudotumor cerebral with lumboperitoneal shunt, hypertension, hypercholesterolemia, diabetes. EXAM: MRI HEAD WITHOUT CONTRAST TECHNIQUE: Multiplanar, multiecho pulse sequences of the brain and surrounding structures were obtained without intravenous contrast. COMPARISON:  CT HEAD July 13, 2018 and MRI head June 04, 2017. FINDINGS: INTRACRANIAL CONTENTS: Multifocal reduced diffusion LEFT frontoparietal lobes with low ADC values. LEFT frontoparietal encephalomalacia with mild hemosiderin staining and chronic microhemorrhages. Old bilateral basal ganglia infarcts. Multifocal small areas RIGHT frontal encephalomalacia. Old small LEFT cerebellar infarct. Patchy supratentorial and pontine white matter FLAIR T2 hyperintensities. No midline shift, mass effect or masses. Mild parenchymal brain  volume loss. No hydrocephalus. No abnormal extra-axial fluid collections. VASCULAR: Normal major intracranial vascular flow voids present at skull base. SKULL AND UPPER CERVICAL SPINE: No abnormal sellar expansion. No suspicious calvarial bone marrow signal. Craniocervical junction maintained. SINUSES/ORBITS: The mastoid air-cells and included paranasal sinuses are well-aerated.The included  ocular globes and orbital contents are non-suspicious. Status post bilateral ocular lens implants. OTHER: Patient is edentulous. IMPRESSION: 1. Acute multifocal LEFT frontoparietal/MCA territory infarcts 2. Old LEFT greater than RIGHT MCA territory infarcts. Old basal ganglia and cerebellar infarcts. 3. Moderate chronic small vessel ischemic changes. 4. Mild parenchymal brain volume loss. 5. Acute findings discussed with and reconfirmed by Dr.ELLIOTT WENTZ on 07/13/2018 at 5:24 pm. Electronically Signed   By: Elon Alas M.D.   On: 07/13/2018 17:26     CBC Recent Labs  Lab 07/13/18 1109 07/13/18 1125 07/14/18 0805 07/15/18 0535 07/16/18 0429  WBC 10.8*  --  8.3 6.5 7.1  HGB 13.8 15.0 12.4 11.0* 11.7*  HCT 44.7 44.0 39.3 35.1* 36.3  PLT 324  --  259 220 241  MCV 88.5  --  86.8 87.5 86.8  MCH 27.3  --  27.4 27.4 28.0  MCHC 30.9  --  31.6 31.3 32.2  RDW 14.6  --  14.6 14.6 14.7  LYMPHSABS 2.3  --   --   --   --   MONOABS 0.7  --   --   --   --   EOSABS 0.2  --   --   --   --   BASOSABS 0.1  --   --   --   --     Chemistries  Recent Labs  Lab 07/13/18 1109 07/13/18 1125 07/13/18 2353 07/14/18 0805  NA 138 138  --  141  K 3.2* 3.5  --  3.6  CL 100 103  --  111  CO2 24  --   --  21*  GLUCOSE 130* 128*  --  95  BUN 38* 40*  --  16  CREATININE 1.27* 1.40*  --  0.88  CALCIUM 9.9  --   --  9.0  MG  --   --  1.7  --   AST 27  --   --   --   ALT 23  --   --   --   ALKPHOS 121  --   --   --   BILITOT 0.4  --   --   --     ------------------------------------------------------------------------------------------------------------------ Recent Labs    07/14/18 0007 07/14/18 0805  CHOL 258*  --   HDL 32*  --   LDLCALC UNABLE TO CALCULATE IF TRIGLYCERIDE OVER 400 mg/dL  --   TRIG 420*  --   CHOLHDL 8.1  --   LDLDIRECT  --  132*    Lab Results  Component Value Date   HGBA1C 6.3 (H) 07/13/2018   ------------------------------------------------------------------------------------------------------------------ No results for input(s): TSH, T4TOTAL, T3FREE, THYROIDAB in the last 72 hours.  Invalid input(s): FREET3 ------------------------------------------------------------------------------------------------------------------ No results for input(s): VITAMINB12, FOLATE, FERRITIN, TIBC, IRON, RETICCTPCT in the last 72 hours.  Coagulation profile Recent Labs  Lab 07/13/18 1109  INR 1.00    No results for input(s): DDIMER in the last 72 hours.  Cardiac Enzymes No results for input(s): CKMB, TROPONINI, MYOGLOBIN in the last 168 hours.  Invalid input(s): CK ------------------------------------------------------------------------------------------------------------------ No results found for: BNP   Verneita Griffes, MD [facility] 7:23 AM   [facility] - Office  [phone]

## 2018-07-16 NOTE — Consult Note (Signed)
[place] for Heparin  Indication: stroke  Labs: Recent Labs    07/14/18 0805  07/15/18 0535 07/16/18 0429 07/16/18 1416  HGB 12.4  --  11.0* 11.7*  --   HCT 39.3  --  35.1* 36.3  --   PLT 259  --  220 241  --   HEPARINUNFRC  --    < > 0.70 0.76* 0.38  CREATININE 0.88  --   --   --   --    < > = values in this interval not displayed.    Estimated Creatinine Clearance: 49.7 mL/min (by C-G formula based on SCr of 0.88 mg/dL).    Assessment: 67 YO female requiring heparin anticoagulation for acute CVA. Not on anticoagulation PTA. Carotid endarterectomy planned for 10/1  Heparin level therapeutic now at 0.38 on 500 units/hr. Hgb and plts stable, no reports of bleeding.  Goal of Therapy:  Heparin level 0.3-0.5 units/ml Monitor platelets by anticoagulation protocol: Yes   Plan:  Continue heparin to 500 units / hr Recheck heparin level at 2330 to ensure remains within therapeutic range Daily heparin level, CBC, s/sx bleeding  Thank you,  Juanell Fairly, PharmD PGY1 Pharmacy Resident Phone [phone] 07/16/2018 3:41 PM

## 2018-07-17 ENCOUNTER — Other Ambulatory Visit: Payer: Self-pay

## 2018-07-17 ENCOUNTER — Inpatient Hospital Stay (HOSPITAL_COMMUNITY): Payer: Medicare Other

## 2018-07-17 DIAGNOSIS — Z01818 Encounter for other preprocedural examination: Secondary | ICD-10-CM

## 2018-07-17 DIAGNOSIS — I10 Essential (primary) hypertension: Secondary | ICD-10-CM

## 2018-07-17 DIAGNOSIS — R4182 Altered mental status, unspecified: Secondary | ICD-10-CM

## 2018-07-17 DIAGNOSIS — I639 Cerebral infarction, unspecified: Secondary | ICD-10-CM

## 2018-07-17 DIAGNOSIS — R0602 Shortness of breath: Secondary | ICD-10-CM

## 2018-07-17 DIAGNOSIS — I63232 Cerebral infarction due to unspecified occlusion or stenosis of left carotid arteries: Secondary | ICD-10-CM

## 2018-07-17 DIAGNOSIS — G40901 Epilepsy, unspecified, not intractable, with status epilepticus: Secondary | ICD-10-CM

## 2018-07-17 LAB — HEPARIN LEVEL (UNFRACTIONATED)
HEPARIN UNFRACTIONATED: 0.3 [IU]/mL (ref 0.30–0.70)
HEPARIN UNFRACTIONATED: 0.29 [IU]/mL — AB (ref 0.30–0.70)
HEPARIN UNFRACTIONATED: 0.28 [IU]/mL — AB (ref 0.30–0.70)

## 2018-07-17 LAB — CBC WITH DIFFERENTIAL/PLATELET
Abs Immature Granulocytes: 0 10*3/uL (ref 0.0–0.1)
Basophils Absolute: 0.1 10*3/uL (ref 0.0–0.1)
Basophils Relative: 1 %
EOS ABS: 0.1 10*3/uL (ref 0.0–0.7)
EOS PCT: 2 %
HEMATOCRIT: 36.2 % (ref 36.0–46.0)
Hemoglobin: 11.4 g/dL — ABNORMAL LOW (ref 12.0–15.0)
Immature Granulocytes: 1 %
LYMPHS ABS: 2.1 10*3/uL (ref 0.7–4.0)
Lymphocytes Relative: 24 %
MCH: 27.4 pg (ref 26.0–34.0)
MCHC: 31.5 g/dL (ref 30.0–36.0)
MCV: 87 fL (ref 78.0–100.0)
Monocytes Absolute: 0.6 10*3/uL (ref 0.1–1.0)
Monocytes Relative: 6 %
Neutro Abs: 5.9 10*3/uL (ref 1.7–7.7)
Neutrophils Relative %: 66 %
Platelets: 230 10*3/uL (ref 150–400)
RBC: 4.16 MIL/uL (ref 3.87–5.11)
RDW: 14.8 % (ref 11.5–15.5)
WBC: 8.8 10*3/uL (ref 4.0–10.5)

## 2018-07-17 LAB — COMPREHENSIVE METABOLIC PANEL
ALT: 17 U/L (ref 0–44)
ANION GAP: 11 (ref 5–15)
AST: 25 U/L (ref 15–41)
Albumin: 3.1 g/dL — ABNORMAL LOW (ref 3.5–5.0)
Alkaline Phosphatase: 96 U/L (ref 38–126)
BUN: 9 mg/dL (ref 8–23)
CHLORIDE: 107 mmol/L (ref 98–111)
CO2: 23 mmol/L (ref 22–32)
Calcium: 9.4 mg/dL (ref 8.9–10.3)
Creatinine, Ser: 1.05 mg/dL — ABNORMAL HIGH (ref 0.44–1.00)
GFR calc Af Amer: >60 mL/min (ref >60)
GFR, EST NON AFRICAN AMERICAN: 54 mL/min — AB (ref >60)
Glucose, Bld: 70 mg/dL (ref 70–99)
Potassium: 3.5 mmol/L (ref 3.5–5.1)
Sodium: 141 mmol/L (ref 135–145)
Total Bilirubin: 0.7 mg/dL (ref 0.3–1.2)
Total Protein: 6 g/dL — ABNORMAL LOW (ref 6.5–8.1)

## 2018-07-17 LAB — POCT I-STAT 3, ART BLOOD GAS (G3+)
Acid-base deficit: 1 mmol/L (ref 0.0–2.0)
BICARBONATE: 23.9 mmol/L (ref 20.0–28.0)
O2 Saturation: 100 %
PO2 ART: 219 mmHg — AB (ref 83.0–108.0)
TCO2: 25 mmol/L (ref 22–32)
pCO2 arterial: 40.9 mmHg (ref 32.0–48.0)
pH, Arterial: 7.374 (ref 7.350–7.450)

## 2018-07-17 LAB — GLUCOSE, CAPILLARY
GLUCOSE-CAPILLARY: 69 mg/dL — AB (ref 70–99)
GLUCOSE-CAPILLARY: 76 mg/dL (ref 70–99)
Glucose-Capillary: 76 mg/dL (ref 70–99)
Glucose-Capillary: 65 mg/dL — ABNORMAL LOW (ref 70–99)
Glucose-Capillary: 152 mg/dL — ABNORMAL HIGH (ref 70–99)
Glucose-Capillary: 112 mg/dL — ABNORMAL HIGH (ref 70–99)
Glucose-Capillary: 82 mg/dL (ref 70–99)

## 2018-07-17 LAB — MRSA PCR SCREENING: MRSA BY PCR: NEGATIVE

## 2018-07-17 MED ORDER — SODIUM CHLORIDE 0.9 % IV SOLN
60.0000 mg/h | INTRAVENOUS | Status: DC
  Administered 2018-07-17: 60 mg/h via INTRAVENOUS
  Administered 2018-07-17: 40 mg/h via INTRAVENOUS
  Administered 2018-07-17: 60 mg/h via INTRAVENOUS
  Administered 2018-07-17: 50 mg/h via INTRAVENOUS
  Administered 2018-07-18: 60 mg/h via INTRAVENOUS
  Filled 2018-07-17 (×5): qty 20

## 2018-07-17 MED ORDER — PHENYLEPHRINE HCL-NACL 10-0.9 MG/250ML-% IV SOLN
0.0000 ug/min | INTRAVENOUS | Status: DC
  Filled 2018-07-17: qty 250

## 2018-07-17 MED ORDER — FENTANYL CITRATE (PF) 100 MCG/2ML IJ SOLN: 100.0000 ug | Freq: Once | INTRAMUSCULAR | Status: DC

## 2018-07-17 MED ORDER — SODIUM CHLORIDE 0.9 % IV BOLUS
500.0000 mL | Freq: Once | INTRAVENOUS | Status: AC
  Administered 2018-07-17: 500 mL via INTRAVENOUS

## 2018-07-17 MED ORDER — CHLORHEXIDINE GLUCONATE 0.12% ORAL RINSE (MEDLINE KIT)
15.0000 mL | Freq: Two times a day (BID) | OROMUCOSAL | Status: DC
  Administered 2018-07-17 – 2018-07-24 (×15): 15 mL via OROMUCOSAL

## 2018-07-17 MED ORDER — MIDAZOLAM BOLUS VIA INFUSION
10.0000 mg | Freq: Once | INTRAVENOUS | Status: AC
  Administered 2018-07-17: 10 mg via INTRAVENOUS
  Filled 2018-07-17: qty 10

## 2018-07-17 MED ORDER — MIDAZOLAM BOLUS VIA INFUSION
10.0000 mg | Freq: Once | INTRAVENOUS | Status: AC
  Administered 2018-07-17: 10 mg via INTRAVENOUS

## 2018-07-17 MED ORDER — VALPROATE SODIUM 500 MG/5ML IV SOLN
1250.0000 mg | Freq: Once | INTRAVENOUS | Status: AC
  Administered 2018-07-17: 1250 mg via INTRAVENOUS
  Filled 2018-07-17: qty 12.5

## 2018-07-17 MED ORDER — ORAL CARE MOUTH RINSE
15.0000 mL | OROMUCOSAL | Status: DC
  Administered 2018-07-17 – 2018-07-24 (×67): 15 mL via OROMUCOSAL

## 2018-07-17 MED ORDER — FAMOTIDINE IN NACL 20-0.9 MG/50ML-% IV SOLN
20.0000 mg | Freq: Two times a day (BID) | INTRAVENOUS | Status: DC
  Administered 2018-07-17 – 2018-07-25 (×16): 20 mg via INTRAVENOUS
  Filled 2018-07-17 (×16): qty 50

## 2018-07-17 MED ORDER — LABETALOL HCL 5 MG/ML IV SOLN
10.0000 mg | INTRAVENOUS | Status: DC | PRN
  Administered 2018-07-17 – 2018-07-26 (×24): 10 mg via INTRAVENOUS
  Filled 2018-07-17 (×22): qty 4

## 2018-07-17 MED ORDER — DEXTROSE 50 % IV SOLN
INTRAVENOUS | Status: AC
  Administered 2018-07-17: 25 mL
  Filled 2018-07-17: qty 50

## 2018-07-17 MED ORDER — MIDAZOLAM HCL 2 MG/2ML IJ SOLN
4.0000 mg | Freq: Once | INTRAMUSCULAR | Status: AC
  Administered 2018-07-17: 4 mg via INTRAVENOUS

## 2018-07-17 MED ORDER — VALPROATE SODIUM 500 MG/5ML IV SOLN
15.0000 mg/kg/d | Freq: Three times a day (TID) | INTRAVENOUS | Status: DC
  Administered 2018-07-17 – 2018-07-18 (×5): 295 mg via INTRAVENOUS
  Filled 2018-07-17 (×7): qty 2.95

## 2018-07-17 MED ORDER — ETOMIDATE 2 MG/ML IV SOLN
20.0000 mg | Freq: Once | INTRAVENOUS | Status: AC
  Administered 2018-07-17: 20 mg via INTRAVENOUS

## 2018-07-17 MED ORDER — SODIUM CHLORIDE 0.9 % IV SOLN
30.0000 mg/h | INTRAVENOUS | Status: DC
  Administered 2018-07-17: 20 mg/h via INTRAVENOUS
  Administered 2018-07-17: 10 mg/h via INTRAVENOUS
  Filled 2018-07-17 (×2): qty 10

## 2018-07-17 MED ORDER — LEVETIRACETAM IN NACL 1000 MG/100ML IV SOLN
1000.0000 mg | Freq: Two times a day (BID) | INTRAVENOUS | Status: DC
  Administered 2018-07-17 – 2018-07-26 (×20): 1000 mg via INTRAVENOUS
  Filled 2018-07-17 (×24): qty 100

## 2018-07-17 NOTE — Progress Notes (Signed)
Pt right arm and right leg began twitching continuously. Spouse at bedside states that this is new and he has never seen this before. BP 194/104, MAP 131, p142, r22, temp 97.6 F, oral. Pt is diaphoretic and c/o of HA, pain score of 3/10. Tylenol po given, MD notified.

## 2018-07-17 NOTE — Progress Notes (Signed)
Patient continues to have breakthrough seizure-like activity in spite of giving benzodiazepines and being loaded with Keppra and Depakote. I have called and the EEG technologist for a stat hookup for long-term overnight EEG. I suspect that she will probably need benzodiazepine drip versus propofol drip and will have to be intubated. I will continue to follow her.  -- Amie Portland, MD [facility] Pager: [phone] If 7pm to 7am, please call on call as listed on AMION.

## 2018-07-17 NOTE — Progress Notes (Signed)
Rhythmic head and neck jerking started back up. Rue twitching noted.

## 2018-07-17 NOTE — Progress Notes (Signed)
Versed gtt has been increased as follows: 10 mg/hr >> 20 >> 30 >> 40, each preceded by a bolus of 10 mg. Still not in burst suppression but with significant improvement of the myoclonic discharges, which are now less frequent. Increasing Versed gtt to 50 after an additional bolus of 10 mg.   Electronically signed: Dr. Kerney Elbe

## 2018-07-17 NOTE — Consult Note (Signed)
[place] for Heparin  Indication: stroke  Labs: Recent Labs    07/15/18 0535 07/16/18 0429  07/16/18 2311 07/17/18 0637 07/17/18 1627  HGB 11.0* 11.7*  --   --  11.4*  --   HCT 35.1* 36.3  --   --  36.2  --   PLT 220 241  --   --  230  --   HEPARINUNFRC 0.70 0.76*   < > 0.30 0.29* 0.28*  CREATININE  --   --   --   --  1.05*  --    < > = values in this interval not displayed.    Estimated Creatinine Clearance: 41.7 mL/min (A) (by C-G formula based on SCr of 1.05 mg/dL (H)).   Assessment: 67 YO female requiring heparin anticoagulation for acute CVA.   Heparin level remains slightly subtherapeutic at 0.28. Hgb and plts stable. No reports of bleeding or complications with infusion.  Carotid endarterectomy planned for 10/1  Goal of Therapy:  Heparin level 0.3-0.5 units/ml Monitor platelets by anticoagulation protocol: Yes   Plan:  Increase heparin to 600 units/hr - will increase dose conservatively d/t narrow therapeutic goal Heparin level in 8 hours to confirm within therapeutic range Daily CBC/HL, s/sx bleeding  Albertina Parr, PharmD., BCPS Clinical Pharmacist Clinical phone for 07/17/18 until 8:30pm: [phone]

## 2018-07-17 NOTE — Progress Notes (Signed)
Assisted with intubation in ICU. Patient on vent no noted respiratory issues at this time.

## 2018-07-17 NOTE — Progress Notes (Signed)
STAT Spot EEG done, result pending.

## 2018-07-17 NOTE — Progress Notes (Signed)
The patient has been transferred to 4N. She was administered 4 mg IV Versed for intubation. CCM is managing the vent. Nursing is having Versed for drip sent up from pharmacy. LTM EEG running. Will bolus with 10 mg IV Versed, start Versed gtt at 10 mg/hr and reassess EEG for titration of Versed to burst suppression.   Electronically signed: Dr. Kerney Elbe

## 2018-07-17 NOTE — Procedures (Signed)
Date of recording 07/17/2018  Referring physician Rory Percy  Reason for the study Altered mental status  Technical Digital EEG recording using 10-20 international electrode system  Description of the recording Posterior dominant rhythm is4-6Hz  symmetrical  EEG comprises generalized delta and theta slowing with reactivity Pulse artifact Epileptiform features were not seen during this recording  Impression The EEG isabnormal and findings are consistent with moderate generalized cerbral dysfunction, epileptiform features were not seen during this recording

## 2018-07-17 NOTE — Progress Notes (Signed)
Pt still seizing; not adequately able to cough up secretions. Rapid Response called. Pt placed on 2L [place], O2 sats 94% on 2L [place]. RN suctioned patient with [place] orally.

## 2018-07-17 NOTE — Progress Notes (Signed)
cEEG running; no skin break down at time of hook up.

## 2018-07-17 NOTE — Consult Note (Signed)
[facility] for Heparin  Indication: stroke  Labs: Recent Labs    07/14/18 0805  07/15/18 0535 07/16/18 0429 07/16/18 1416 07/16/18 2311  HGB 12.4  --  11.0* 11.7*  --   --   HCT 39.3  --  35.1* 36.3  --   --   PLT 259  --  220 241  --   --   HEPARINUNFRC  --    < > 0.70 0.76* 0.38 0.30  CREATININE 0.88  --   --   --   --   --    < > = values in this interval not displayed.    Estimated Creatinine Clearance: 49.7 mL/min (by C-G formula based on SCr of 0.88 mg/dL).    Assessment: 67 YO female requiring heparin anticoagulation for acute CVA.   Heparin level therapeutic x 2   Carotid endarterectomy planned for 10/1  Goal of Therapy:  Heparin level 0.3-0.5 units/ml Monitor platelets by anticoagulation protocol: Yes   Plan:  Cont heparin at 500 units/hr Daily CBC/HL Monitor for bleeding  Narda Bonds, PharmD, BCPS Clinical Pharmacist Phone: [phone]

## 2018-07-17 NOTE — Consult Note (Signed)
Name: [name] MRN: [id number] DOB: [birth year]    ADMISSION DATE:  07/13/2018 CONSULTATION DATE: July 17, 2018  REFERRING MD : Neurology service  CHIEF COMPLAINT: Status epilepticus     HISTORY OF PRESENT ILLNESS:  Patient is 67 year old Caucasian female with history of normal pressure hydrocephalus status post shunting and multiple strokes carotid stenosis hypertension hyperlipidemia who is here because of status epilepticus that is not controlled with conventional therapy Patient was transferred to the ICU for intubation and starting her on Versed drip Case was discussed with neurology PA for thought processes of seizure focus is the multi area of embolic strokes and patient was switched from antiplatelets antithrombotic with heparin given her embolic nature of the stroke.  PAST MEDICAL HISTORY :   has a past medical history of Anemia, Anesthesia complication, Diabetes mellitus, Family history of anesthesia complication (32 yrs ago), GERD (gastroesophageal reflux disease), Headache(784.0), Hypercholesteremia, Hypertension, Peripheral neuropathy, PONV (postoperative nausea and vomiting), Pseudotumor cerebri, Seizures ([place]), Synovitis of ankle (04/2013), and Wears dentures.  has a past surgical history that includes Appendectomy; Bladder suspension; Lumbar peritoneal shunt; Total abdominal hysterectomy w/ bilateral salpingoophorectomy (1994); Ovarian cyst removal; Shunt revision (2007); Breast lumpectomy w/ needle localization (Left, 05/22/2011); Esophageal dilation (06/23/2006; 08/05/2004); Ankle arthroscopy (Left, 05/18/2013); Esophagogastroduodenoscopy (N/A, 07/04/2013); Balloon dilation (N/A, 07/04/2013); Wrist surgery (Left, 2014); Colonoscopy with propofol (N/A, 05/14/2014); Esophagogastroduodenoscopy (egd) with propofol (N/A, 05/14/2014); CAROTID ANGIOGRAPHY (Bilateral, 06/03/2017); Radiology with anesthesia (N/A, 06/03/2017); IR PERCUTANEOUS ART THROMBECTOMY/INFUSION INTRACRANIAL INC  DIAG ANGIO (06/03/2017); IR ANGIO VERTEBRAL SEL SUBCLAVIAN INNOMINATE UNI R MOD SED (06/03/2017); IR PERCUTANEOUS ART THROMBECTOMY/INFUSION INTRACRANIAL INC DIAG ANGIO (06/03/2017); IR GASTROSTOMY TUBE MOD SED (06/25/2017); IR GASTROSTOMY TUBE REMOVAL/REPAIR (08/20/2017); and IR Radiologist Eval & Mgmt (09/07/2017). Prior to Admission medications   Medication Sig Start Date End Date Taking? Authorizing Provider  busPIRone (BUSPAR) 5 MG tablet Take 5 mg by mouth 3 (three) times daily. 03/12/18  Yes [provider]  CARTIA XT 180 MG 24 hr capsule Take 180 mg by mouth daily. 05/13/18  Yes [provider]  cloNIDine (CATAPRES) 0.1 MG tablet Take 0.1 mg by mouth 2 (two) times daily. 03/31/18  Yes [provider]  clopidogrel (PLAVIX) 75 MG tablet Take 1 tablet (75 mg total) by mouth daily with breakfast. 07/09/17  Yes Love, Ivan Anchors, PA-C  diclofenac sodium (VOLTAREN) 1 % GEL Apply 2 application topically 4 (four) times daily as needed (pain).    Yes [provider]  esomeprazole (NEXIUM) 40 MG capsule Take 40 mg by mouth daily as needed (acid reflux). Acid reflux   Yes [provider]  insulin glargine (LANTUS) 100 unit/mL SOPN Inject 0.2 mLs (20 Units total) into the skin at bedtime. Patient taking differently: Inject 22 Units into the skin at bedtime.  07/08/17  Yes Love, Ivan Anchors, PA-C  metFORMIN (GLUMETZA) 500 MG (MOD) 24 hr tablet Take 500 mg by mouth daily.    Yes [provider]  metoprolol succinate (TOPROL-XL) 100 MG 24 hr tablet Take 100 mg by mouth 2 (two) times daily.    Yes [provider]  PRESCRIPTION MEDICATION Take 20 mg by mouth See admin instructions. Domperidone 10 mg from [place]: Take 2 tablets (20 mg) by mouth three times daily with meals - for diabetic gastroparesis   Yes [provider]  prochlorperazine (COMPAZINE) 5 MG tablet Take 1 tablet (5 mg total) by mouth every 8 (eight) hours as needed for nausea or vomiting. Have  been taking it with meals--wean  as able Patient taking differently: Take 5 mg by mouth every 8 (eight) hours as needed for nausea or vomiting.  07/08/17  Yes Love, Ivan Anchors, PA-C  ranitidine (ZANTAC) 75 MG tablet Take 75-150 mg by mouth daily.  03/07/18  Yes [provider]  sertraline (ZOLOFT) 50 MG tablet Take 1 tablet (50 mg total) by mouth daily. 05/18/18  Yes Melvenia Beam, MD  tiZANidine (ZANAFLEX) 4 MG tablet Take 4 mg by mouth 2 (two) times daily as needed. 06/30/18  Yes [provider]  topiramate (TOPAMAX) 50 MG tablet Take 1 tablet (50 mg total) by mouth 2 (two) times daily. 07/08/17  Yes Love, Ivan Anchors, PA-C  traMADol (ULTRAM) 50 MG tablet Take 50 mg by mouth 3 (three) times daily as needed for pain. 07/05/18  Yes [provider]  VICTOZA 18 MG/3ML SOPN Inject 1.2 mg into the skin at bedtime.  05/02/18  Yes [provider]  ACCU-CHEK COMPACT PLUS test strip  01/18/18   [provider]  acetaZOLAMIDE (DIAMOX) 250 MG tablet Take 1 tablet (250 mg total) by mouth 3 (three) times daily. 07/16/17   Melvenia Beam, MD   Allergies  Allergen Reactions  . Carbamazepine Hives, Shortness Of Breath and Other (See Comments)  . Penicillins Anaphylaxis, Other (See Comments) and Swelling    Mother, father and brother have history of anaphylaxis reaction to penicillin so pt does not take  My throat swells Familial history of anaphylaxis   . Statins Other (See Comments)    Severe muscle weakness, leg numbness, severe headaches, chest pain   . Tricyclic Antidepressants Other (See Comments)    IMPAIRED MEMORY  . Atorvastatin Other (See Comments)    Severe muscle weakness, leg numbness, severe headaches, chest pain   . Ezetimibe Other (See Comments)    Severe muscle weakness, leg numbness, severe headaches, chest pain   . Nortriptyline Other (See Comments)    IMPAIRED MEMORY  . Quinapril Hcl Other (See Comments)    Unknown reaction  . Rifampin Diarrhea   . Metoclopramide Nausea And Vomiting and Rash    FAMILY HISTORY:  family history includes Heart disease in her father. SOCIAL HISTORY:  reports that she has never smoked. She has never used smokeless tobacco. She reports that she drinks alcohol. She reports that she does not use drugs.  REVIEW OF SYSTEMS:   Unable to obtain due to patient's conditions.  SUBJECTIVE:   VITAL SIGNS: Temp:  [97.5 F (36.4 C)-98.6 F (37 C)] 98 F (36.7 C) (09/29 0355) Pulse Rate:  [95-124] 110 (09/29 1432) Resp:  [13-22] 13 (09/29 1432) BP: (126-202)/(90-115) 202/115 (09/29 1432) SpO2:  [91 %-99 %] 98 % (09/29 1432) FiO2 (%):  [60 %] 60 % (09/29 1432)  PHYSICAL EXAMINATION: General: Patient is a status epilepticus Neuro: Patient is in status epilepticus HEENT:  atraumatic , no jaundice , dry mucous membranes  Cardiovascular:  Irregular irregular , ESM 2/6 in the aortic area  Lungs:  CTA bilateral , no wheezing or crackles  Abdomen:  Soft lax +BS , no tenderness . Musculoskeletal:  WNL , normal pulses  Skin:  No rash    Recent Labs  Lab 07/13/18 1109 07/13/18 1125 07/14/18 0805 07/17/18 0637  NA 138 138 141 141  K 3.2* 3.5 3.6 3.5  CL 100 103 111 107  CO2 24  --  21* 23  BUN 38* 40* 16 9  CREATININE 1.27* 1.40* 0.88 1.05*  GLUCOSE 130* 128* 95 70  Recent Labs  Lab 07/15/18 0535 07/16/18 0429 07/17/18 0637  HGB 11.0* 11.7* 11.4*  HCT 35.1* 36.3 36.2  WBC 6.5 7.1 8.8  PLT 220 241 230   Ct Head Wo Contrast  Result Date: 07/16/2018 CLINICAL DATA:  Follow up stroke. EXAM: CT HEAD WITHOUT CONTRAST TECHNIQUE: Contiguous axial images were obtained from the base of the skull through the vertex without intravenous contrast. COMPARISON:  MRI head July 13, 2018 FINDINGS: Moderately motion degraded examination. BRAIN: No intraparenchymal hemorrhage, mass effect nor midline shift. Involving acute on chronic LEFT frontoparietal infarcts. Small area RIGHT frontal parietal  encephalomalacia. Old basal ganglia and cerebellar infarcts better demonstrated on prior MRI. No advanced parenchymal brain volume loss for age. No hydrocephalus. No abnormal extra-axial fluid collections. Basal cisterns are patent. VASCULAR: Mild calcific atherosclerosis of the carotid siphons. SKULL: No skull fracture. No significant scalp soft tissue swelling. SINUSES/ORBITS: Trace paranasal sinus mucosal thickening. Mastoid air cells are well aerated.The included ocular globes and orbital contents are non-suspicious. OTHER: Patient is edentulous. IMPRESSION: 1. Moderately motion degraded examination. 2. Acute on chronic LEFT MCA territory infarcts without hemorrhagic conversion. 3. Old small RIGHT MCA territory infarcts. Old small supra and infratentorial infarcts better seen on prior MRI due to motion. Electronically Signed   By: Elon Alas M.D.   On: 07/16/2018 23:31   Dg Chest Port 1 View  Result Date: 07/17/2018 CLINICAL DATA:  Shortness of breath, seizure. EXAM: PORTABLE CHEST 1 VIEW COMPARISON:  Chest radiograph July 13, 2018 FINDINGS: Similar hazy density LEFT lung base with blunting of the costophrenic angle. RIGHT lung is clear. Cardiomediastinal silhouette is normal. No pneumothorax. Catheters RIGHT upper quadrant. IMPRESSION: LEFT lung base suspected scarring with small LEFT pleural effusion versus pleural thickening. Electronically Signed   By: Elon Alas M.D.   On: 07/17/2018 01:16    ASSESSMENT / PLAN:  --Status epilepticus. --Normal pressure hydrocephalus status post shunting. --Multiple embolic strokes. --Severe hypertension. --Hyperlipidemia and diabetes   --Intubation with Versed drip might need to propofol drip for the seizures continue with Keppra and valproic acid. --Embolic stroke started on heparin consider adding antiplatelets with aspirin or Plavix will defer to neurology. --Normal pressure hydrocephalus seem to be functioning shunt. --Hypertension  we will add labetalol as needed I suspect her blood pressure will drop after that she will start on the sedation. --We will add a PPI GI prophylaxis she is on IV heparin for DVT prophylaxis and that she is a full code.    Pulmonary and [place] Pager: [phone]  07/17/2018, 2:39 PM

## 2018-07-17 NOTE — Progress Notes (Signed)
Twitching of head and LUE stopped at 0132 while depakote is infusing

## 2018-07-17 NOTE — Progress Notes (Signed)
EEG started. Prelim review shows generalized slowing with some bilateral sharps. Does not appear to be in status epilepticus but the formal read is pending at this time. Formal read to follow during AM.  Recs: Depakote 500 BID Keppra 500 BID Await formal read of cEEG and spot EEG. Further recs per clinical course. Husband expressing that she does not want to be sustained on life supporting measures if neurological meaningful recovery is not likely. She has been deteriorating neurolgically over past few months to year. Family conversations to continue in the AM. Will hold off on callng PCCM for intubation until formal EEG report available, if decision is made to intubate and use Versed/Propofol drips.   -- Amie Portland, MD [facility] Pager: [phone] If 7pm to 7am, please call on call as listed on AMION.

## 2018-07-17 NOTE — Progress Notes (Signed)
Events noted-  Patient seen examined and discussed serious nature of events with husband this am 06:45  husband was unclear about CODE status and wasn't sure if patient would have wanted to be intubated for SZ Rx  I defer to CCM and Neurology further care  Verneita Griffes, MD [facility] 1:07 PM

## 2018-07-17 NOTE — Progress Notes (Signed)
Patient transferred to 4N-24, report given

## 2018-07-17 NOTE — Progress Notes (Addendum)
LTM EEG reviewed with findings suspicious for generalized bursts of cortical myoclonus. Patient then reassessed, with AMS/somnolence noted in conjunction with intermittent low amplitude jerking of RUE and rightward jerking of neck, during and shortly after which her responsiveness drops to an obtunded state. Other findings on exam: 6 mm symmetric pupils constricting to 3 mm with light, 4+ bilateral patellar and achilles reflexes, 3+ upper extremity reflexes.   Also discussed EEG with Dr. Leonel Ramsay, who agrees that the tracings are most consistent with cortical myoclonus.  A/R: Cortical myoclonus status epilepticus following acute multifocal LEFT frontoparietal/MCA territory infarcts diagnosed on 9/25.    1. Discussed with ICU team. She is being transferred to 4N for intubation and sedation with Versed towards the goal of burst suppression.  2. Discussed with patient's husband, who expressed understanding and agreement with the plan.  3. Continue valproic acid and Keppra.  4. Frequent neuro checks.  5. CCM to call neurology after the patient is intubated.  6. Continue LTM EEG.   A total of 40 minutes was spent in the emergent neurological evaluation and management of this critically ill patient with status epilepticus. Time spent included coordination of care and EEG reviews.   Electronically signed: Dr. Kerney Elbe

## 2018-07-17 NOTE — Consult Note (Signed)
ANTICOAGULATION CONSULT NOTE  Pharmacy Consult for Heparin  Indication: stroke  Labs: Recent Labs    07/15/18 0535 07/16/18 0429 07/16/18 1416 07/16/18 2311 07/17/18 0637  HGB 11.0* 11.7*  --   --  11.4*  HCT 35.1* 36.3  --   --  36.2  PLT 220 241  --   --  230  HEPARINUNFRC 0.70 0.76* 0.38 0.30 0.29*    Estimated Creatinine Clearance: 49.7 mL/min (by C-G formula based on SCr of 0.88 mg/dL).   Assessment: 67 YO female requiring heparin anticoagulation for acute CVA.   Heparin level slightly subtherapeutic at 0.29. Hgb and plts stable. No reports of bleeding or complications with infusion.  Carotid endarterectomy planned for 10/1  Goal of Therapy:  Heparin level 0.3-0.5 units/ml Monitor platelets by anticoagulation protocol: Yes   Plan:  Increase heparin to 550 units/hr - will increase dose conservatively d/t narrow therapeutic goal Heparin level in 8 hours to confirm within therapeutic range Daily CBC/HL, s/sx bleeding   Juanell Fairly, PharmD PGY1 Pharmacy Resident Phone [phone] 07/17/2018 8:26 AM

## 2018-07-17 NOTE — Progress Notes (Signed)
STROKE TEAM PROGRESS NOTE   HISTORY OF PRESENT ILLNESS (per Dr Yvetta Coder consult 07/13/2018)  [name] is a 67 y.o. female with history of seizure, pseudotumor cerebri, hypertension, hypercholesterolemia, headaches, diabetes.  Per husband, last Thursday she was throwing up and hunsband thought it was food poisoning. There was no weakness, however. Today, she was normal at 2 AM and upon waking at 0800 husband noted she was very weak, not talking, could not open jaw or eyes, but could follow instructions. Her husband had to help her dress. He did not note any new weakness or facial droop. She was able to walk to wheel chair and then to a car. She had a MRI as part of her ED assessment, which showed new strokes. No missed doses of Plavix.  MRI brain 07/13/2018 1. Acute multifocal LEFT frontoparietal/MCA territory infarcts 2. Old LEFT greater than RIGHT MCA territory infarcts. Old basal ganglia and cerebellar infarcts. 3. Moderate chronic small vessel ischemic changes. 4. Mild parenchymal brain volume loss.  She also has a history of pseudotumor cerebri, diagnosed in 1999. Has lumboperitoneal shunt. Last shunt revision in 2007.   LKW: 2 AM tpa given?: no, out of window Premorbid modified Rankin scale (mRS):  Stroke scale 3  The patient was seen in consultation by Dr. Donzetta Matters on 07/14/2018 for a 60% stenosis of the left proximal carotid artery.  Carotid endarterectomy was planned for Tuesday, 07/19/2018.  In the interim the patient was to remain on IV heparin per pharmacy.  Dr. Erlinda Hong signed off on 07/15/2018; however, the patient developed seizure-like activity on 07/16/2018 and was seen by Dr. Rory Percy.  The patient continued to have breakthrough seizures despite multiple medications.  A CT scan of the head was repeated on 07/16/2018 which revealed acute on chronic left MCA territory infarcts.  We were asked to see the patient again.     SUBJECTIVE (INTERVAL HISTORY) Her  Husband and daughter are  at the bedside.  ,No more seizures noted this am. She remains unresponsive.EEG shows mild gen slowing. Long term EEG results are pending    OBJECTIVE Vitals:   07/16/18 2218 07/16/18 2328 07/17/18 0125 07/17/18 0355  BP: (!) 145/93 (!) 126/91 (!) 131/93 140/90  Pulse:  (!) 124 99 95  Resp:    20  Temp:  98.6 F (37 C) 97.6 F (36.4 C) 98 F (36.7 C)  TempSrc:  Oral Axillary Oral  SpO2:  96% 99% 99%  Weight:      Height:        CBC:  Recent Labs  Lab 07/13/18 1109  07/16/18 0429 07/17/18 0637  WBC 10.8*   < > 7.1 8.8  NEUTROABS 7.5  --   --  5.9  HGB 13.8   < > 11.7* 11.4*  HCT 44.7   < > 36.3 36.2  MCV 88.5   < > 86.8 87.0  PLT 324   < > 241 230   < > = values in this interval not displayed.    Basic Metabolic Panel:  Recent Labs  Lab 07/13/18 2353 07/14/18 0805 07/17/18 0637  NA  --  141 141  K  --  3.6 3.5  CL  --  111 107  CO2  --  21* 23  GLUCOSE  --  95 70  BUN  --  16 9  CREATININE  --  0.88 1.05*  CALCIUM  --  9.0 9.4  MG 1.7  --   --   PHOS 2.9  --   --  Lipid Panel:     Component Value Date/Time   CHOL 258 (H) 07/14/2018 0007   TRIG 420 (H) 07/14/2018 0007   HDL 32 (L) 07/14/2018 0007   CHOLHDL 8.1 07/14/2018 0007   VLDL UNABLE TO CALCULATE IF TRIGLYCERIDE OVER 400 mg/dL 07/14/2018 0007   LDLCALC UNABLE TO CALCULATE IF TRIGLYCERIDE OVER 400 mg/dL 07/14/2018 0007   HgbA1c:  Lab Results  Component Value Date   HGBA1C 6.3 (H) 07/13/2018   Urine Drug Screen: No results found for: LABOPIA, COCAINSCRNUR, LABBENZ, AMPHETMU, THCU, LABBARB  Alcohol Level No results found for: ETH   IMAGING  Ct Head Wo Contrast 07/16/2018 IMPRESSION:  1. Moderately motion degraded examination.  2. Acute on chronic LEFT MCA territory infarcts without hemorrhagic conversion.  3. Old small RIGHT MCA territory infarcts. Old small supra and infratentorial infarcts better seen on prior MRI due to motion.    Dg Chest Port 1 View 07/17/2018 IMPRESSION:   LEFT lung base suspected scarring with small LEFT pleural effusion versus pleural thickening.    Dg Chest 2 View 07/13/2018 IMPRESSION:  Mild scarring left base with questionable small left pleural effusion. Lungs elsewhere clear. Mild cardiac prominence. There is aortic atherosclerosis. Aortic Atherosclerosis (ICD10-I70.0).    Ct Head Wo Contrast 07/13/2018 IMPRESSION:  Chronic brain atrophy and white matter microvascular ischemic changes. Evolutionary changes of multifocal subcortical infarcts bilaterally, largest in the high left frontal lobe when compared 06/04/2017 MRI. No acute intracranial hemorrhage.    Mr Brain Wo Contrast 07/13/2018 IMPRESSION:  1. Acute multifocal LEFT frontoparietal/MCA territory infarcts  2. Old LEFT greater than RIGHT MCA territory infarcts. Old basal ganglia and cerebellar infarcts.  3. Moderate chronic small vessel ischemic changes.  4. Mild parenchymal brain volume loss.    CTA Head and Neck 07/14/2018 IMPRESSION: 1. No emergent large vessel occlusion. 2. Approximately 60% stenosis of the left internal carotid artery by NASCET measurements. The eccentric appearance of the low density plaque within the lumen is unchanged. 3. Severe stenosis of the proximal left A1 segment, slightly worsened. 4. Mild narrowing of the distal portion of the right MCA M1 segment. 5. Acute/subacute infarct of the left frontoparietal MCA territory. 6. Aortic Atherosclerosis (ICD10-I70.0).    Transthoracic Echocardiogram  07/14/2018 Study Conclusions - Left ventricle: The cavity size was normal. There was mild concentric hypertrophy. Systolic function was vigorous. The estimated ejection fraction was in the range of 65% to 70%. Wall motion was normal; there were no regional wall motion abnormalities. Doppler parameters are consistent with abnormal left ventricular relaxation (grade 1 diastolic dysfunction). There was no evidence of elevated  ventricular filling pressure by Doppler parameters. - Aortic valve: There was no regurgitation. - Mitral valve: There was no regurgitation. - Right ventricle: The cavity size was normal. Wall thickness was normal. Systolic function was normal. - Tricuspid valve: There was trivial regurgitation. - Inferior vena cava: The vessel was normal in size. - Pericardium, extracardiac: There was no pericardial effusion. Impressions: - No cardiac source of emboli was indentified.    EEG  07/17/2018 Impression The EEG isabnormal and findings are consistent with moderate generalized cerbral dysfunction, epileptiform features were not seen during this recording    PHYSICAL EXAM  Temp:  [98 F (36.7 C)-98.7 F (37.1 C)] 98.7 F (37.1 C) (09/27 0818) Pulse Rate:  [91-110] 110 (09/27 0818) Resp:  [16-18] 16 (09/27 0818) BP: (144-187)/(86-93) 160/93 (09/27 0818) SpO2:  [96 %-100 %] 96 % (09/27 0818)  General - frail elderly caucasian lady not in any  distress.  Ophthalmologic - fundi not visualized due to noncooperation.  Cardiovascular - Regular rate and rhythm.  Neuro unresponsive lethargic. Not following any commands does not blink to threat PERRL, EOMI, left mild nasolabial fold flattening.  Tongue midline.  Moves left side spontaneously and withdraws right side less than left DTR 1+, no Babinski.  Sensation symmetrical, bilateral upper extremity intact coordination, however slow on action.  Gait not tested.   ASSESSMENT/PLAN Ms. VERNA DESROCHER is a 67 y.o. 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 No tPA given due to outside window.    Stroke:  left MCA and PCA patchy infarcts, embolic likely secondary to left ICA  bifurcation soft plaque rupture  Resultant lethargy, dysphagia  CT chronic bilateral patchy infarcts, left frontal new infarcts since last exam but not acute this time.  CT Head 07/16/18 - Acute on chronic LEFT MCA territory infarcts without hemorrhagic conversion.   MRI left MCA and PCA patchy infarcts  CTA head neck bilateral ICA bifurcation calcified and soft plaques with left ICA 60% stenosis and thrombus protruding into the lumen  2D Echo - EF 65 - 70%. No cardiac source of emboli identified.   EEG - 07/17/18 - consistent with moderate generalized cerbral dysfunction.  LDL - NTC due to TG 420  HgbA1c 6.3  SCDs for VTE prophylaxis  Clopidogrel 75 mg daily prior to admission, now on heparin IV per pharmacy.  Patient counseled to be compliant with her antithrombotic medications  Ongoing aggressive stroke risk factor management  Therapy recommendations: Pending  Disposition: Pending  Carotid stenosis and atherosclerosis  05/2017 CT head and neck showed left ICA 60 to 65% with significant calcified and soft plaque  05/2017 carotid Doppler showed left ICA 40 to 59% stenosis  05/2017 cerebral angiogram showed left ICA less than 50% by Dr. Bridgett Larsson  This admission CTA head and neck again showed left ICA stenosis with significant calcified and soft plaque with thrombus protruding into the lumen  Currently on heparin IV, once CEA completed, can resume plavix on discharge.  Vascular surgery had been consulted and Dr. Donzetta Matters planned Lt CEA for 07/19/2018.  History of strokes  05/2017 stroke due to right hand weakness, MRI showed left MCA patchy infarcts, CTA head and neck left ICA 60 to 65% stenosis.  EF 60/60%.  Carotid Doppler left ICA 40 to 59%.  Cerebral angiogram left ICA less than 50% by [facility].  LDL 211 and A1c 9.5.  05/2017 patient developed new symptoms after cerebral angiogram, CT abdomen showed bilateral M1 thrombosis, had thrombectomy status post bilateral MCA TICI3  reperfusion.  MRI showed bilateral inferior and superior infarcts indicating embolic source.  EEG negative.  Stroke considered angiogram procedure related.  She underwent trach and PEG and eventually discharged to inpatient rehab with DAPT.  She has statin intolerance, recommend outpatient PCSK9 inhibitors.  She follows with Dr. Jaynee Eagles at [facility], was later on Plavix PTA.  Plain CT this admission showed possible interval left MCA infarcts  Diabetes  HgbA1c 6.3 goal < 7.0  Controlled  Currently on Lantus  CBG monitoring  SSI  DM education and close PCP follow up  Hypertension  Stable  Permissive hypertension (OK if <180/105) for 24-48 hours post stroke and then gradually normalized within 5-7 days.  Long term BP goal 130-150 given bilateral  ICA stenosis  Hyperlipidemia  Home meds: None  LDL NTC due to TG 420, goal < 70  Consider Zetia once p.o. 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  Hospital day # 2  I have personally examined this patient, reviewed notes, independently viewed imaging studies, participated in medical decision making and plan of care.ROS completed by me personally and pertinent positives fully documented  I have made any additions or clarifications directly to the above note.  She presented with a small left MCA infarct with symptomatic left ICA stenosis and was awaiting elective carotid surgery but has developed seizures. Prognosis is guarded at the present time. Long discussion with her husband and daughter at the bedside. Check long-term EEG results. If her condition improves gradually the next few days may proceed with planned for carotid surgery but if her condition deteriorates family  does not want prolonged life support for more aggressive measures. They're thinking about DO NOT RESUSCITATE but have not decided yet. Greater than 50% time during this 35 minute visit was spent in counseling and coordination of care about her seizures, stroke, carotid stenosis and answering questions  Antony Contras, MD Medical Director [facility] Pager: [phone] 07/17/2018 11:37 AM                       Hospital day # 4    To contact Stroke Continuity provider, please refer to [email or web address] After hours, contact General Neurology

## 2018-07-18 ENCOUNTER — Inpatient Hospital Stay (HOSPITAL_COMMUNITY): Payer: Medicare Other

## 2018-07-18 ENCOUNTER — Inpatient Hospital Stay: Payer: Self-pay

## 2018-07-18 DIAGNOSIS — Z794 Long term (current) use of insulin: Secondary | ICD-10-CM

## 2018-07-18 DIAGNOSIS — E1159 Type 2 diabetes mellitus with other circulatory complications: Secondary | ICD-10-CM

## 2018-07-18 LAB — CBC WITH DIFFERENTIAL/PLATELET
ABS IMMATURE GRANULOCYTES: 0 10*3/uL (ref 0.0–0.1)
Abs Immature Granulocytes: 0 10*3/uL (ref 0.0–0.1)
BASOS PCT: 0 %
BASOS PCT: 1 %
Basophils Absolute: 0 10*3/uL (ref 0.0–0.1)
Basophils Absolute: 0 10*3/uL (ref 0.0–0.1)
EOS ABS: 0.1 10*3/uL (ref 0.0–0.7)
Eosinophils Absolute: 0.1 10*3/uL (ref 0.0–0.7)
Eosinophils Relative: 2 %
Eosinophils Relative: 2 %
HCT: 27.6 % — ABNORMAL LOW (ref 36.0–46.0)
HEMATOCRIT: 32 % — AB (ref 36.0–46.0)
HEMOGLOBIN: 9.9 g/dL — AB (ref 12.0–15.0)
Hemoglobin: 8.5 g/dL — ABNORMAL LOW (ref 12.0–15.0)
IMMATURE GRANULOCYTES: 1 %
IMMATURE GRANULOCYTES: 1 %
LYMPHS ABS: 1.3 10*3/uL (ref 0.7–4.0)
LYMPHS ABS: 1.4 10*3/uL (ref 0.7–4.0)
Lymphocytes Relative: 25 %
Lymphocytes Relative: 21 %
MCH: 27.7 pg (ref 26.0–34.0)
MCH: 27.1 pg (ref 26.0–34.0)
MCHC: 30.8 g/dL (ref 30.0–36.0)
MCHC: 30.9 g/dL (ref 30.0–36.0)
MCV: 89.9 fL (ref 78.0–100.0)
MCV: 87.7 fL (ref 78.0–100.0)
MONO ABS: 0.5 10*3/uL (ref 0.1–1.0)
MONOS PCT: 7 %
Monocytes Absolute: 0.3 10*3/uL (ref 0.1–1.0)
Monocytes Relative: 7 %
NEUTROS PCT: 65 %
NEUTROS PCT: 68 %
Neutro Abs: 3.4 10*3/uL (ref 1.7–7.7)
Neutro Abs: 4.5 10*3/uL (ref 1.7–7.7)
PLATELETS: 123 10*3/uL — AB (ref 150–400)
PLATELETS: 172 10*3/uL (ref 150–400)
RBC: 3.07 MIL/uL — AB (ref 3.87–5.11)
RBC: 3.65 MIL/uL — ABNORMAL LOW (ref 3.87–5.11)
RDW: 15 % (ref 11.5–15.5)
RDW: 15.1 % (ref 11.5–15.5)
WBC: 5.1 10*3/uL (ref 4.0–10.5)
WBC: 6.5 10*3/uL (ref 4.0–10.5)

## 2018-07-18 LAB — HEPARIN LEVEL (UNFRACTIONATED)
HEPARIN UNFRACTIONATED: 0.21 [IU]/mL — AB (ref 0.30–0.70)
HEPARIN UNFRACTIONATED: 0.91 [IU]/mL — AB (ref 0.30–0.70)
Heparin Unfractionated: 0.58 IU/mL (ref 0.30–0.70)

## 2018-07-18 LAB — POCT I-STAT 3, ART BLOOD GAS (G3+)
ACID-BASE DEFICIT: 5 mmol/L — AB (ref 0.0–2.0)
Bicarbonate: 20.6 mmol/L (ref 20.0–28.0)
O2 Saturation: 100 %
PCO2 ART: 37.6 mmHg (ref 32.0–48.0)
PO2 ART: 208 mmHg — AB (ref 83.0–108.0)
Patient temperature: 97.6
TCO2: 22 mmol/L (ref 22–32)
pH, Arterial: 7.345 — ABNORMAL LOW (ref 7.350–7.450)

## 2018-07-18 LAB — COMPREHENSIVE METABOLIC PANEL
ALBUMIN: 2.6 g/dL — AB (ref 3.5–5.0)
ALK PHOS: 80 U/L (ref 38–126)
ALT: 13 U/L (ref 0–44)
ANION GAP: 7 (ref 5–15)
AST: 19 U/L (ref 15–41)
BUN: <5 mg/dL — ABNORMAL LOW (ref 8–23)
CALCIUM: 7.4 mg/dL — AB (ref 8.9–10.3)
CHLORIDE: 115 mmol/L — AB (ref 98–111)
CO2: 20 mmol/L — AB (ref 22–32)
Creatinine, Ser: 0.76 mg/dL (ref 0.44–1.00)
GFR calc Af Amer: >60 mL/min (ref >60)
GFR calc non Af Amer: >60 mL/min (ref >60)
GLUCOSE: 74 mg/dL (ref 70–99)
Potassium: 2.7 mmol/L — CL (ref 3.5–5.1)
SODIUM: 142 mmol/L (ref 135–145)
Total Bilirubin: 0.5 mg/dL (ref 0.3–1.2)
Total Protein: 5.1 g/dL — ABNORMAL LOW (ref 6.5–8.1)

## 2018-07-18 LAB — GLUCOSE, CAPILLARY
GLUCOSE-CAPILLARY: 70 mg/dL (ref 70–99)
Glucose-Capillary: 66 mg/dL — ABNORMAL LOW (ref 70–99)
Glucose-Capillary: 92 mg/dL (ref 70–99)
Glucose-Capillary: 68 mg/dL — ABNORMAL LOW (ref 70–99)
Glucose-Capillary: 104 mg/dL — ABNORMAL HIGH (ref 70–99)
Glucose-Capillary: 78 mg/dL (ref 70–99)
Glucose-Capillary: 128 mg/dL — ABNORMAL HIGH (ref 70–99)
Glucose-Capillary: 65 mg/dL — ABNORMAL LOW (ref 70–99)
Glucose-Capillary: 124 mg/dL — ABNORMAL HIGH (ref 70–99)

## 2018-07-18 LAB — PROTIME-INR
INR: 1.83
PROTHROMBIN TIME: 21 s — AB (ref 11.4–15.2)

## 2018-07-18 LAB — MAGNESIUM
Magnesium: 1.2 mg/dL — ABNORMAL LOW (ref 1.7–2.4)
Magnesium: 2.4 mg/dL (ref 1.7–2.4)

## 2018-07-18 LAB — APTT: aPTT: 143 seconds — ABNORMAL HIGH (ref 24–36)

## 2018-07-18 LAB — VALPROIC ACID LEVEL: VALPROIC ACID LVL: 72 ug/mL (ref 50.0–100.0)

## 2018-07-18 LAB — PHOSPHORUS: Phosphorus: 2.3 mg/dL — ABNORMAL LOW (ref 2.5–4.6)

## 2018-07-18 MED ORDER — DEXTROSE 50 % IV SOLN
25.0000 mL | Freq: Once | INTRAVENOUS | Status: AC
  Administered 2018-07-18: 25 mL via INTRAVENOUS

## 2018-07-18 MED ORDER — SODIUM CHLORIDE 0.9 % IV SOLN
200.0000 mg | Freq: Two times a day (BID) | INTRAVENOUS | Status: DC
  Administered 2018-07-19 – 2018-07-29 (×20): 200 mg via INTRAVENOUS
  Filled 2018-07-18 (×30): qty 20

## 2018-07-18 MED ORDER — SODIUM CHLORIDE 0.9 % IV SOLN
200.0000 mg | INTRAVENOUS | Status: AC
  Administered 2018-07-18: 200 mg via INTRAVENOUS
  Filled 2018-07-18: qty 20

## 2018-07-18 MED ORDER — DEXTROSE 50 % IV SOLN
12.5000 g | Freq: Once | INTRAVENOUS | Status: AC
  Administered 2018-07-18: 12.5 g via INTRAVENOUS

## 2018-07-18 MED ORDER — VANCOMYCIN HCL IN DEXTROSE 1-5 GM/200ML-% IV SOLN: 1000.0000 mg | INTRAVENOUS | Status: DC

## 2018-07-18 MED ORDER — VITAL HIGH PROTEIN PO LIQD
1000.0000 mL | ORAL | Status: DC
  Administered 2018-07-19: 1000 mL
  Filled 2018-07-18 (×2): qty 1000

## 2018-07-18 MED ORDER — PRO-STAT SUGAR FREE PO LIQD
30.0000 mL | Freq: Two times a day (BID) | ORAL | Status: DC
  Administered 2018-07-18 – 2018-07-19 (×2): 30 mL
  Filled 2018-07-18 (×2): qty 30

## 2018-07-18 MED ORDER — SODIUM CHLORIDE 0.9 % IV SOLN
60.0000 mg/h | INTRAVENOUS | Status: DC
  Administered 2018-07-18 (×2): 60 mg/h via INTRAVENOUS
  Filled 2018-07-18 (×2): qty 50

## 2018-07-18 MED ORDER — FENTANYL CITRATE (PF) 100 MCG/2ML IJ SOLN
50.0000 ug | INTRAMUSCULAR | Status: DC | PRN
  Administered 2018-07-21 – 2018-07-23 (×7): 50 ug via INTRAVENOUS
  Filled 2018-07-18 (×8): qty 2

## 2018-07-18 MED ORDER — MAGNESIUM SULFATE 4 GM/100ML IV SOLN
4.0000 g | Freq: Once | INTRAVENOUS | Status: AC
  Administered 2018-07-18: 4 g via INTRAVENOUS
  Filled 2018-07-18: qty 100

## 2018-07-18 MED ORDER — DEXTROSE 50 % IV SOLN
INTRAVENOUS | Status: AC
  Administered 2018-07-18: 25 mL via INTRAVENOUS
  Filled 2018-07-18: qty 50

## 2018-07-18 MED ORDER — POTASSIUM CHLORIDE 20 MEQ PO PACK
40.0000 meq | PACK | Freq: Once | ORAL | Status: AC
  Administered 2018-07-18: 40 meq
  Filled 2018-07-18: qty 2

## 2018-07-18 MED ORDER — DEXTROSE 50 % IV SOLN
INTRAVENOUS | Status: AC
  Filled 2018-07-18: qty 50

## 2018-07-18 MED ORDER — DEXTROSE 50 % IV SOLN
50.0000 mL | Freq: Once | INTRAVENOUS | Status: AC
  Administered 2018-07-18: 50 mL via INTRAVENOUS

## 2018-07-18 MED ORDER — POTASSIUM CHLORIDE 20 MEQ/15ML (10%) PO SOLN
40.0000 meq | Freq: Three times a day (TID) | ORAL | Status: AC
  Administered 2018-07-18: 40 meq
  Filled 2018-07-18: qty 30

## 2018-07-18 NOTE — Progress Notes (Signed)
NAME:  [name], MRN:  [id number], DOB:  11-27-1950, LOS: 5 ADMISSION DATE:  07/13/2018, CONSULTATION DATE: 07/17/2018 REFERRING MD: Dr. Cheral Marker, Neurology, CHIEF COMPLAINT: Seizures  Brief History   67 year old woman, history of prior CVAs with carotid artery disease, hypertension, hyperlipidemia, normal pressure hydrocephalus status post shunt.  Admitted with seizures and a new left MCA territory stroke.  Treated with heparin and antiplatelets.  Had recurrent seizures and transferred to ICU 9/29 for Versed and seizure suppression.  Intubated and ventilated 9/29 to tolerate.  Past Medical History   has a past medical history of Anemia, Anesthesia complication, Diabetes mellitus, Family history of anesthesia complication (32 yrs ago), GERD (gastroesophageal reflux disease), Headache(784.0), Hypercholesteremia, Hypertension, Peripheral neuropathy, PONV (postoperative nausea and vomiting), Pseudotumor cerebri, Seizures ([place]), Synovitis of ankle (04/2013), and Wears dentures.  Significant Hospital Events   Intubated for burst suppression, sedation 9/29  Consults: date of consult/date signed off & final recs:  Neurology 9/25  Procedures (surgical and bedside):  ETT 9/29 >>   Significant Diagnostic Tests:  CT head 9/25 >> evolutionary changes of multiple subcortical infarcts largest in the high left frontal lobe MRI brain 9/25 >> acute multifocal left frontoparietal MCA territory infarcts, old left greater than right MCA territory infarcts, chronic small vessel ischemic changes CT angio head and neck 9/26 >> left ICA 60% stenosis, severe stenosis of the proximal left A1 segment, mild narrowing distal portion right MCA M1 segment CT head 9/28 >> acute on chronic left MCA territory infarct without hemorrhagic conversion, old right MCA territory infarct LTM EEG 9/29 >> generalized burst of cortical myoclonus  Micro Data:  None  Antimicrobials:  None  Subjective:  Currently sedated,  intubated and on PRVC Versed discontinued this morning  Objective   Blood pressure 136/63, pulse 71, temperature (!) 97.5 F (36.4 C), temperature source Axillary, resp. rate (!) 9, height 5\' 2"  (1.575 m), weight 59 kg, SpO2 100 %.    Vent Mode: PRVC FiO2 (%):  [30 %-60 %] 30 % Set Rate:  [14 bmp] 14 bmp Vt Set:  [400 mL] 400 mL PEEP:  [5 cmH20] 5 cmH20 Plateau Pressure:  [12 cmH20-14 cmH20] 13 cmH20   Intake/Output Summary (Last 24 hours) at 07/18/2018 1118 Last data filed at 07/18/2018 0800 Gross per 24 hour  Intake 4442.31 ml  Output 520 ml  Net 3922.31 ml   Filed Weights   07/13/18 1106  Weight: 59 kg    Examination: General: Ill-appearing woman, ventilated HENT: Endotracheal tube in place, no oral lesions Lungs: Clear bilaterally, decreased at both bases Cardiovascular: Regular, no murmur Abdomen: Soft, nondistended, positive bowel sounds Extremities: No significant edema, she has some bruising on the dorsum of her right foot Neuro: Deeply sedated, does not respond to voice or pain  Resolved Hospital Problem list   Shock in the setting of sedation  Assessment & Plan:   Acute respiratory failure due to renal status, inability to protect airway -Assess for neurological improvement, readiness for spontaneous breathing trials as sedation is lightened  Acute frequent seizures -Appreciate neurology management -Keppra, Depakote as ordered -Versed discontinued 9/30 -Continuous EEG in place  Acute left MCA stroke Chronic stroke, chronic left carotid disease -Heparin drip -Timing of aspirin per neurology recommendations  Hypertension -Labetalol as needed, goal SBP < 160  Hypokalemia -Replacing 9/30 -Follow BMP  Hyperlipidemia -Gemfibrozil  Hyperglycemia -Lantus 10 units -Sliding scale insulin as ordered   Disposition / Summary of Today's Plan 07/18/18   Antiepileptics as per neurology plans,  note Versed discontinued 9/30.  Await improvement in mental  status before we can start to consider spontaneous breathing trials and extubation.  Difficult IV access, we will order a PICC line on 9/30    Diet: N.p.o. Pain/Anxiety/Delirium protocol (if indicated): Fentanyl as needed VAP protocol (if indicated): In place DVT prophylaxis: Heparin drip GI prophylaxis: Pepcid Hyperglycemia protocol: Sliding scale insulin in place Mobility: Bedrest Code Status: Full Family Communication: Spoke with daughter 9/30 at bedside   Labs   CBC: Recent Labs  Lab 07/13/18 1109  07/15/18 0535 07/16/18 0429 07/17/18 0637 07/18/18 0106 07/18/18 0235  WBC 10.8*   < > 6.5 7.1 8.8 5.1 6.5  NEUTROABS 7.5  --   --   --  5.9 3.4 4.5  HGB 13.8   < > 11.0* 11.7* 11.4* 8.5* 9.9*  HCT 44.7   < > 35.1* 36.3 36.2 27.6* 32.0*  MCV 88.5   < > 87.5 86.8 87.0 89.9 87.7  PLT 324   < > 220 241 230 123* 172   < > = values in this interval not displayed.    Basic Metabolic Panel: Recent Labs  Lab 07/13/18 1109 07/13/18 1125 07/13/18 2353 07/14/18 0805 07/17/18 0637 07/18/18 0235  NA 138 138  --  141 141 142  K 3.2* 3.5  --  3.6 3.5 2.7*  CL 100 103  --  111 107 115*  CO2 24  --   --  21* 23 20*  GLUCOSE 130* 128*  --  95 70 74  BUN 38* 40*  --  16 9 <5*  CREATININE 1.27* 1.40*  --  0.88 1.05* 0.76  CALCIUM 9.9  --   --  9.0 9.4 7.4*  MG  --   --  1.7  --   --  1.2*  PHOS  --   --  2.9  --   --   --    GFR: Estimated Creatinine Clearance: 54.7 mL/min (by C-G formula based on SCr of 0.76 mg/dL). Recent Labs  Lab 07/16/18 0429 07/17/18 0637 07/18/18 0106 07/18/18 0235  WBC 7.1 8.8 5.1 6.5    Liver Function Tests: Recent Labs  Lab 07/13/18 1109 07/17/18 0637 07/18/18 0235  AST 27 25 19   ALT 23 17 13   ALKPHOS 121 96 80  BILITOT 0.4 0.7 0.5  PROT 7.2 6.0* 5.1*  ALBUMIN 4.0 3.1* 2.6*   No results for input(s): LIPASE, AMYLASE in the last 168 hours. No results for input(s): AMMONIA in the last 168 hours.  ABG    Component Value Date/Time    PHART 7.345 (L) 07/18/2018 0452   PCO2ART 37.6 07/18/2018 0452   PO2ART 208.0 (H) 07/18/2018 0452   HCO3 20.6 07/18/2018 0452   TCO2 22 07/18/2018 0452   ACIDBASEDEF 5.0 (H) 07/18/2018 0452   O2SAT 100.0 07/18/2018 0452     Coagulation Profile: Recent Labs  Lab 07/13/18 1109 07/18/18 0106  INR 1.00 1.83    Cardiac Enzymes: No results for input(s): CKTOTAL, CKMB, CKMBINDEX, TROPONINI in the last 168 hours.  HbA1C: Hgb A1c MFr Bld  Date/Time Value Ref Range Status  07/13/2018 11:53 PM 6.3 (H) 4.8 - 5.6 % Final    Comment:    (NOTE) Pre diabetes:          5.7%-6.4% Diabetes:              >6.4% Glycemic control for   <7.0% adults with diabetes   06/02/2017 07:41 AM 9.5 (H) 4.8 - 5.6 %  Final    Comment:    (NOTE)         Prediabetes: 5.7 - 6.4         Diabetes: >6.4         Glycemic control for adults with diabetes: <7.0     CBG: Recent Labs  Lab 07/17/18 1930 07/17/18 2342 07/18/18 0340 07/18/18 0359 07/18/18 0803  GLUCAP 82 76 66* 92 70     Review of Systems:   Unable to obtain  Past Medical History  She,  has a past medical history of Anemia, Anesthesia complication, Diabetes mellitus, Family history of anesthesia complication (32 yrs ago), GERD (gastroesophageal reflux disease), Headache(784.0), Hypercholesteremia, Hypertension, Peripheral neuropathy, PONV (postoperative nausea and vomiting), Pseudotumor cerebri, Seizures ([place]), Synovitis of ankle (04/2013), and Wears dentures.   Surgical History    Past Surgical History:  Procedure Laterality Date  . ANKLE ARTHROSCOPY Left 05/18/2013   Procedure: LEFT ANKLE ARTHROSCOPY WITH DEBRIDEMENT,  SUBTALAR OPEN DEBRIDEMENT ;  Surgeon: Wylene Simmer, MD;  Location: [facility];  Service: Orthopedics;  Laterality: Left;  . APPENDECTOMY     as a child  . BALLOON DILATION N/A 07/04/2013   Procedure: BALLOON DILATION;  Surgeon: Garlan Fair, MD;  Location: [facility];  Service: Endoscopy;   Laterality: N/A;  . BLADDER SUSPENSION    . BREAST LUMPECTOMY W/ NEEDLE LOCALIZATION Left 05/22/2011  . CAROTID ANGIOGRAPHY Bilateral 06/03/2017   Procedure: Bilateral Carotid Angiography;  Surgeon: Conrad Eads, MD;  Location: [facility];  Service: Cardiovascular;  Laterality: Bilateral;  . COLONOSCOPY WITH PROPOFOL N/A 05/14/2014   Procedure: COLONOSCOPY WITH PROPOFOL;  Surgeon: Garlan Fair, MD;  Location: [facility];  Service: Endoscopy;  Laterality: N/A;  . ESOPHAGEAL DILATION  06/23/2006; 08/05/2004  . ESOPHAGOGASTRODUODENOSCOPY N/A 07/04/2013   Procedure: ESOPHAGOGASTRODUODENOSCOPY (EGD);  Surgeon: Garlan Fair, MD;  Location: [facility];  Service: Endoscopy;  Laterality: N/A;  . ESOPHAGOGASTRODUODENOSCOPY (EGD) WITH PROPOFOL N/A 05/14/2014   Procedure: ESOPHAGOGASTRODUODENOSCOPY (EGD) WITH PROPOFOL;  Surgeon: Garlan Fair, MD;  Location: [facility];  Service: Endoscopy;  Laterality: N/A;  . IR ANGIO VERTEBRAL SEL SUBCLAVIAN INNOMINATE UNI R MOD SED  06/03/2017  . IR GASTROSTOMY TUBE MOD SED  06/25/2017  . IR GASTROSTOMY TUBE REMOVAL  08/20/2017  . IR PERCUTANEOUS ART THROMBECTOMY/INFUSION INTRACRANIAL INC DIAG ANGIO  06/03/2017  . IR PERCUTANEOUS ART THROMBECTOMY/INFUSION INTRACRANIAL INC DIAG ANGIO  06/03/2017  . IR RADIOLOGIST EVAL & MGMT  09/07/2017  . LUMBAR PERITONEAL SHUNT     x 2  . OVARIAN CYST REMOVAL     age 56  . RADIOLOGY WITH ANESTHESIA N/A 06/03/2017   Procedure: RADIOLOGY WITH ANESTHESIA;  Surgeon: Luanne Bras, MD;  Location: [facility];  Service: Radiology;  Laterality: N/A;  . SHUNT REVISION  2007  . TOTAL ABDOMINAL HYSTERECTOMY W/ BILATERAL SALPINGOOPHORECTOMY  1994   age 79s  . WRIST SURGERY Left 2014    [name]     Social History   Social History   Socioeconomic History  . Marital status: Married    Spouse name: Simona Huh  . Number of children: 2  . Years of education: 41  . Highest education level: Not on file  Occupational History    . Not on file  Social Needs  . Financial resource strain: Not on file  . Food insecurity:    Worry: Not on file    Inability: Not on file  . Transportation needs:    Medical: Not on  file    Non-medical: Not on file  Tobacco Use  . Smoking status: Never Smoker  . Smokeless tobacco: Never Used  Substance and Sexual Activity  . Alcohol use: Yes    Comment: occasional beer  . Drug use: No  . Sexual activity: Not on file  Lifestyle  . Physical activity:    Days per week: Not on file    Minutes per session: Not on file  . Stress: Not on file  Relationships  . Social connections:    Talks on phone: Not on file    Gets together: Not on file    Attends religious service: Not on file    Active member of club or organization: Not on file    Attends meetings of clubs or organizations: Not on file    Relationship status: Not on file  . Intimate partner violence:    Fear of current or ex partner: Not on file    Emotionally abused: Not on file    Physically abused: Not on file    Forced sexual activity: Not on file  Other Topics Concern  . Not on file  Social History Narrative   Lives at home with husband and daughter   Caffeine use: soda daily   Right handed  ,  reports that she has never smoked. She has never used smokeless tobacco. She reports that she drinks alcohol. She reports that she does not use drugs.   Family History   Her family history includes Heart disease in her father. There is no history of Migraines.   Allergies Allergies  Allergen Reactions  . Carbamazepine Hives, Shortness Of Breath and Other (See Comments)  . Penicillins Anaphylaxis, Other (See Comments) and Swelling    Mother, father and brother have history of anaphylaxis reaction to penicillin so pt does not take  My throat swells Familial history of anaphylaxis   . Statins Other (See Comments)    Severe muscle weakness, leg numbness, severe headaches, chest pain   . Tricyclic Antidepressants  Other (See Comments)    IMPAIRED MEMORY  . Atorvastatin Other (See Comments)    Severe muscle weakness, leg numbness, severe headaches, chest pain   . Ezetimibe Other (See Comments)    Severe muscle weakness, leg numbness, severe headaches, chest pain   . Nortriptyline Other (See Comments)    IMPAIRED MEMORY  . Quinapril Hcl Other (See Comments)    Unknown reaction  . Rifampin Diarrhea  . Metoclopramide Nausea And Vomiting and Rash     Home Medications  Prior to Admission medications   Medication Sig Start Date End Date Taking? Authorizing Provider  busPIRone (BUSPAR) 5 MG tablet Take 5 mg by mouth 3 (three) times daily. 03/12/18  Yes [provider]  CARTIA XT 180 MG 24 hr capsule Take 180 mg by mouth daily. 05/13/18  Yes [provider]  cloNIDine (CATAPRES) 0.1 MG tablet Take 0.1 mg by mouth 2 (two) times daily. 03/31/18  Yes [provider]  clopidogrel (PLAVIX) 75 MG tablet Take 1 tablet (75 mg total) by mouth daily with breakfast. 07/09/17  Yes Love, Ivan Anchors, PA-C  diclofenac sodium (VOLTAREN) 1 % GEL Apply 2 application topically 4 (four) times daily as needed (pain).    Yes [provider]  esomeprazole (NEXIUM) 40 MG capsule Take 40 mg by mouth daily as needed (acid reflux). Acid reflux   Yes [provider]  insulin glargine (LANTUS) 100 unit/mL SOPN Inject 0.2 mLs (20 Units total) into  the skin at bedtime. Patient taking differently: Inject 22 Units into the skin at bedtime.  07/08/17  Yes Love, Ivan Anchors, PA-C  metFORMIN (GLUMETZA) 500 MG (MOD) 24 hr tablet Take 500 mg by mouth daily.    Yes [provider]  metoprolol succinate (TOPROL-XL) 100 MG 24 hr tablet Take 100 mg by mouth 2 (two) times daily.    Yes [provider]  PRESCRIPTION MEDICATION Take 20 mg by mouth See admin instructions. Domperidone 10 mg from [place]: Take 2 tablets (20 mg) by mouth three times daily with meals - for diabetic gastroparesis   Yes  [provider]  prochlorperazine (COMPAZINE) 5 MG tablet Take 1 tablet (5 mg total) by mouth every 8 (eight) hours as needed for nausea or vomiting. Have been taking it with meals--wean as able Patient taking differently: Take 5 mg by mouth every 8 (eight) hours as needed for nausea or vomiting.  07/08/17  Yes Love, Ivan Anchors, PA-C  ranitidine (ZANTAC) 75 MG tablet Take 75-150 mg by mouth daily.  03/07/18  Yes [provider]  sertraline (ZOLOFT) 50 MG tablet Take 1 tablet (50 mg total) by mouth daily. 05/18/18  Yes Melvenia Beam, MD  tiZANidine (ZANAFLEX) 4 MG tablet Take 4 mg by mouth 2 (two) times daily as needed. 06/30/18  Yes [provider]  topiramate (TOPAMAX) 50 MG tablet Take 1 tablet (50 mg total) by mouth 2 (two) times daily. 07/08/17  Yes Love, Ivan Anchors, PA-C  traMADol (ULTRAM) 50 MG tablet Take 50 mg by mouth 3 (three) times daily as needed for pain. 07/05/18  Yes [provider]  VICTOZA 18 MG/3ML SOPN Inject 1.2 mg into the skin at bedtime.  05/02/18  Yes [provider]  ACCU-CHEK COMPACT PLUS test strip  01/18/18   [provider]  acetaZOLAMIDE (DIAMOX) 250 MG tablet Take 1 tablet (250 mg total) by mouth 3 (three) times daily. 07/16/17   Melvenia Beam, MD    Independent CC time 35 minutes  Baltazar Apo, MD, PhD 07/18/2018, 11:47 AM [facility] [phone] or if no answer [phone]

## 2018-07-18 NOTE — Progress Notes (Addendum)
eLink Physician-Brief Progress Note Patient Name: [name] DOB: 12-Sep-1951 MRN: [id number]   Date of Service  07/18/2018  HPI/Events of Note  Hypokalemia hypomagnesemia  eICU Interventions  replace     Intervention Category Intermediate Interventions: Electrolyte abnormality - evaluation and management  Simonne Maffucci 07/18/2018, 3:27 AM

## 2018-07-18 NOTE — Progress Notes (Signed)
SLP Cancellation Note  Patient Details Name: [name] MRN: [id number] DOB: June 09, 1951   Cancelled treatment:       Reason Eval/Treat Not Completed: Medical issues which prohibited therapy; pt intubated.     Juan Quam Laurice 07/18/2018, 9:26 AM

## 2018-07-18 NOTE — Progress Notes (Signed)
PT Cancellation Note  Patient Details Name: [name] MRN: [id number] DOB: Oct 28, 1950   Cancelled Treatment:    Reason Eval/Treat Not Completed: Medical issues which prohibited therapy Thanks,  [name]. [name], PT, DPT  Acute Rehabilitation [phone] pager [phone]) [phone] office    [name] [name] 07/18/2018, 11:07 AM

## 2018-07-18 NOTE — Progress Notes (Signed)
Hypoglycemic Event  CBG: 65  Treatment: D50 IV 50 mL  Symptoms: None  Follow-up CBG: Time:2009 CBG Result:128  Possible Reasons for Event: Inadequate meal intake -MD asked about starting tube feeds or dextrose fluids. No new orders yet.     Alyjah Lovingood, [place] 8:16 PM

## 2018-07-18 NOTE — Progress Notes (Signed)
Chaplain received two spiritual consults one, one suggesting support needed, and another possible last rites. Chaplain saw pt's eyes were closed and someone else asleep in room.  Chaplain spoke with current on-call nurse who said "we are not there yet" and felt that my presence might be upsetting. I offered prayer just outside room.  Nurse will contact me if chaplain is needed.   Will follow.   Tamsen Snider Pager [phone]

## 2018-07-18 NOTE — Progress Notes (Signed)
   Current situation discussed with family.  She was scheduled for left carotid endarterectomy tomorrow we will postpone this.  Should she make sufficient recovery we can reevaluate for surgery electively.  Trannie Bardales C. Donzetta Matters, MD [facility] Office: [phone] Pager: [phone]

## 2018-07-18 NOTE — Progress Notes (Addendum)
Subjective: No ongoing seizures by my preliminary review on high dose of Versed, full report pending  Exam: Vitals:   07/18/18 0745 07/18/18 0800  BP: 119/66 137/68  Pulse: 70 71  Resp: 14 14  Temp:  (!) 97.5 F (36.4 C)  SpO2: 100% 100%   Gen: In bed, intubated Resp: Ventilated Abd: soft, nt  Neuro: Limited due to high-dose Versed MS: Does not open eyes CN: Pupils equal round reactive, eyes are disconjugate Motor: Does not respond to noxious stimulation Sensory: As above  Pertinent Labs: Hypokalemic at 2.7  Impression: 67 year old female with frequent clinical seizures following recent infarcts.  Due to the frequent nature and her failure to respond to initial antiepileptics, she was started on high-dose Versed with cessation of these discharges.  I would favor weaning her sedation at this time.  Recommendations: 1) Depakote level 2) continue Keppra 1 g twice daily 3) discontinue Versed 4) continue Depacon  This patient is critically ill and at significant risk of neurological worsening, death and care requires constant monitoring of vital signs, hemodynamics,respiratory and cardiac monitoring, neurological assessment, discussion with family, other specialists and medical decision making of high complexity. I spent 35 minutes of neurocritical care time  in the care of  this patient.  Roland Rack, MD [facility] [phone]  If 7pm- 7am, please page neurology on call as listed in [place]. 07/18/2018  9:40 AM

## 2018-07-18 NOTE — Progress Notes (Signed)
OT Cancellation Note  Patient Details Name: [name] MRN: [id number] DOB: 07-30-1951   Cancelled Treatment:    Reason Eval/Treat Not Completed: Patient not medically ready  Ramond Dial, OT/L   Acute OT Clinical Specialist Acute Rehabilitation Services Pager [phone] Office [phone]  07/18/2018, 9:41 AM

## 2018-07-18 NOTE — Progress Notes (Signed)
LTM EEG checked, no skin breakdown noted on frontal leads. 

## 2018-07-18 NOTE — Progress Notes (Signed)
ANTICOAGULATION CONSULT NOTE - Follow Up Consult  Pharmacy Consult for Heparin Indication: stroke  Allergies  Allergen Reactions  . Carbamazepine Hives, Shortness Of Breath and Other (See Comments)  . Penicillins Anaphylaxis, Other (See Comments) and Swelling    Mother, father and brother have history of anaphylaxis reaction to penicillin so pt does not take  My throat swells Familial history of anaphylaxis   . Statins Other (See Comments)    Severe muscle weakness, leg numbness, severe headaches, chest pain   . Tricyclic Antidepressants Other (See Comments)    IMPAIRED MEMORY  . Atorvastatin Other (See Comments)    Severe muscle weakness, leg numbness, severe headaches, chest pain   . Ezetimibe Other (See Comments)    Severe muscle weakness, leg numbness, severe headaches, chest pain   . Nortriptyline Other (See Comments)    IMPAIRED MEMORY  . Quinapril Hcl Other (See Comments)    Unknown reaction  . Rifampin Diarrhea  . Metoclopramide Nausea And Vomiting and Rash    Patient Measurements: Height: 5\' 2"  (157.5 cm) Weight: 130 lb (59 kg) IBW/kg (Calculated) : 50.1 Heparin Dosing Weight:  59 kg  Vital Signs: Temp: 97.4 F (36.3 C) (09/30 1600) Temp Source: Axillary (09/30 1600) BP: 125/93 (09/30 1700) Pulse Rate: 71 (09/30 1700)  Labs: Recent Labs    07/17/18 0637  07/18/18 0106 07/18/18 0235 07/18/18 0836 07/18/18 1538  HGB 11.4*  --  8.5* 9.9*  --   --   HCT 36.2  --  27.6* 32.0*  --   --   PLT 230  --  123* 172  --   --   APTT  --   --  143*  --   --   --   LABPROT  --   --  21.0*  --   --   --   INR  --   --  1.83  --   --   --   HEPARINUNFRC 0.29*   < > 0.21*  --  0.58 0.91*  CREATININE 1.05*  --   --  0.76  --   --    < > = values in this interval not displayed.    Estimated Creatinine Clearance: 54.7 mL/min (by C-G formula based on SCr of 0.76 mg/dL).  Assessment: Anticoag: carotid thrombus, CVA, HgB and plts stable. No bleeding or infusion  complications reported. Why is INR 1.83?  HL 0.58>0.91? Phlebotomist assured me it was drawn correctly in opposite arm.  Goal of Therapy:  Heparin level 0.3-0.5 units/ml Monitor platelets by anticoagulation protocol: Yes   Plan:   Decrease IV heparin to 450 units/hr and recheck level in 8 hrs for clearance.   Abdulmalik Darco S. Alford Highland, PharmD, BCPS Clinical Staff Pharmacist Pager [phone]  [name] 07/18/2018,5:36 PM

## 2018-07-18 NOTE — Progress Notes (Signed)
Hypoglycemic Event  CBG: 68  Treatment: D50 IV 25 mL  Symptoms: None  Follow-up CBG: Time:1225 CBG Result:  Possible Reasons for Event: Inadequate meal intake     [place], Emery P

## 2018-07-18 NOTE — Consult Note (Signed)
ANTICOAGULATION CONSULT NOTE  Pharmacy Consult for Heparin  Indication: stroke  Labs: Recent Labs    07/17/18 0637 07/17/18 1627 07/18/18 0106 07/18/18 0235 07/18/18 0836  HGB 11.4*  --  8.5* 9.9*  --   HCT 36.2  --  27.6* 32.0*  --   PLT 230  --  123* 172  --   APTT  --   --  143*  --   --   LABPROT  --   --  21.0*  --   --   INR  --   --  1.83  --   --   HEPARINUNFRC 0.29* 0.28* 0.21*  --  0.58  CREATININE 1.05*  --   --  0.76  --     Estimated Creatinine Clearance: 54.7 mL/min (by C-G formula based on SCr of 0.76 mg/dL).   Assessment: 67 YO female requiring heparin anticoagulation for acute CVA.   Heparin level is 0.58, above the goal range. No reports of bleeding or complications with infusion.  Goal of Therapy:  Heparin level 0.3-0.5 units/ml Monitor platelets by anticoagulation protocol: Yes   Plan:  Decrease heparin to 600 units/hr - will decrease dose conservatively d/t narrow therapeutic goal range Recheck heparin level at 1600 Daily CBC/HL, s/sx bleeding  Alanda Slim, PharmD, [facility] Clinical Pharmacist Please see AMION for all Pharmacists' Contact Phone Numbers 07/18/2018, 9:49 AM

## 2018-07-18 NOTE — Progress Notes (Signed)
Nutrition Follow-up  DOCUMENTATION CODES:   Not applicable  INTERVENTION:   If unable to extubated recommend Vital AF 1.2 @ 40 ml/hr (960 ml/day)  Provides: 1152 kcal, 84 grams protein, and 802 ml free water  NUTRITION DIAGNOSIS:   Inadequate oral intake related to inability to eat as evidenced by NPO status. Ongoing.   GOAL:   Patient will meet greater than or equal to 90% of their needs Not met.   MONITOR:   Labs, Skin, Weight trends, I & O's, Diet advancement  ASSESSMENT:   67 y.o. female with medical history significant of DM 2, CVA, anemia, GERD, HLD, HTN seizures disorder.Pseudotumor cerebri, chronic refractory headaches, debility, sp PEG and Trach now removed. MRI confirmed new CVA  Pt with new CVA developed seizures, transferred to the ICU for versed drip and intubated 9/29.   Patient is currently intubated on ventilator support MV: 6 L/min Temp (24hrs), Avg:97.6 F (36.4 C), Min:97.3 F (36.3 C), Max:97.9 F (36.6 C)  Medications reviewed and include: 10 units lantus Labs reviewed: K+ 2.7 (L) OG in stomach    Diet Order:   Diet Order            Diet NPO time specified  Diet effective now              EDUCATION NEEDS:   Not appropriate for education at this time  Skin:  Skin Assessment: Reviewed RN Assessment  Last BM:  9/25  Height:   Ht Readings from Last 1 Encounters:  07/13/18 5' 2"  (1.575 m)    Weight:   Wt Readings from Last 1 Encounters:  07/13/18 59 kg    Ideal Body Weight:  50 kg  BMI:  Body mass index is 23.78 kg/m.  Estimated Nutritional Needs:   Kcal:  1130  Protein:  70-85 grams  Fluid:  > 1.5 L/day  Maylon Peppers RD, LDN, CNSC [phone] Pager [phone] After Hours Pager

## 2018-07-18 NOTE — Progress Notes (Signed)
Elink MD notified of critical values. Mag 1.2 and K+ 2.7.

## 2018-07-18 NOTE — Procedures (Signed)
Electroencephalography report.  Long-term monitoring  Recording begins 07/17/2018 at 7:30 AM Recording ends on 07/18/2018 at 7:30 AM  CPT 95951 Day 1  Date acquisition: International 10-20 for eligible placement.  18 channels EEG with additional EKG channel  This intensive EEG monitoring with simultaneous video monitoring was spent with this patient presenting with head and arm twitching.  Background activities marked by 7 8 cps background activity slowing with superimposed muscle and movement artifact predominantly involving the right hemisphere based on the head position in the bed.  This movement artifact was accompanied by rhythmic right-handed jerking to the right and simultaneous right arm rhythmic jerking.  Sometimes right facial muscles were involved in dystonic and rhythmic jerking.  This is most consistent with focal motor seizures involving head the right face and right arm at least what was visible on video EEG.  There was no obvious ictal EEG changes with this events.  Again a rhythmic muscle twitch artifact was present.  Clinical interpretation: This day 1 of intensive EEG monitoring with simultaneous video monitoring recorded multiple rhythmic head jerking to the right with right facial dystonia and right arm jerking twitching and dystonic posturing.  These findings most consistent with focal motor seizures arising from a left lateral frontal lobe involving left primary motor cortex.  Level of awareness and alertness was not formally assessed.  The differential would include simple partial seizures versus complex partial seizures arising from left frontal motor cortex.  However lack of ictal pattern on EEG with this events would probably argue toward simple partial motor seizures.  Clinical correlation is advised.

## 2018-07-18 NOTE — Consult Note (Signed)
[place] for Heparin  Indication: stroke  Labs: Recent Labs    07/16/18 0429  07/17/18 0637 07/17/18 1627 07/18/18 0106  HGB 11.7*  --  11.4*  --  8.5*  HCT 36.3  --  36.2  --  27.6*  PLT 241  --  230  --  123*  HEPARINUNFRC 0.76*   < > 0.29* 0.28* 0.21*  CREATININE  --   --  1.05*  --   --    < > = values in this interval not displayed.    Estimated Creatinine Clearance: 41.7 mL/min (A) (by C-G formula based on SCr of 1.05 mg/dL (H)).   Assessment: 67 YO female requiring heparin anticoagulation for acute CVA.   Heparin level is 0.21, subtherapeutic and trending down. No reports of bleeding or complications with infusion.  Carotid endarterectomy planned for 10/1  Goal of Therapy:  Heparin level 0.3-0.5 units/ml Monitor platelets by anticoagulation protocol: Yes   Plan:  Increase heparin to 650 units/hr - will increase dose conservatively d/t narrow therapeutic goal Recheck heparin level at 0800 Daily CBC/HL, s/sx bleeding  Maryanna Shape, PharmD, BCPS, BCPPS Clinical Pharmacist  Pager: [phone]

## 2018-07-18 NOTE — Progress Notes (Signed)
Assessed patient's upper arm veins bilaterally for adequate vessel for PICC placement. Vessels observed were with many deviations and size were too small to safely reside the catheter. Patient's nurse informed and will speak with primary physician for alternative intravenous access.

## 2018-07-19 ENCOUNTER — Encounter (HOSPITAL_COMMUNITY)
Admission: EM | Disposition: A | Discharge status: Hospice | Payer: Self-pay | Source: Home / Self Care | Attending: Internal Medicine

## 2018-07-19 ENCOUNTER — Inpatient Hospital Stay (HOSPITAL_COMMUNITY): Payer: Medicare Other

## 2018-07-19 ENCOUNTER — Ambulatory Visit: Payer: Medicare Other | Admitting: Sports Medicine

## 2018-07-19 DIAGNOSIS — J9600 Acute respiratory failure, unspecified whether with hypoxia or hypercapnia: Secondary | ICD-10-CM

## 2018-07-19 LAB — BASIC METABOLIC PANEL
Anion gap: 11 (ref 5–15)
BUN: 9 mg/dL (ref 8–23)
CALCIUM: 8.7 mg/dL — AB (ref 8.9–10.3)
CHLORIDE: 112 mmol/L — AB (ref 98–111)
CO2: 18 mmol/L — ABNORMAL LOW (ref 22–32)
CREATININE: 0.85 mg/dL (ref 0.44–1.00)
Glucose, Bld: 157 mg/dL — ABNORMAL HIGH (ref 70–99)
Potassium: 4.6 mmol/L (ref 3.5–5.1)
Sodium: 141 mmol/L (ref 135–145)

## 2018-07-19 LAB — CBC
HEMATOCRIT: 34.3 % — AB (ref 36.0–46.0)
Hemoglobin: 11 g/dL — ABNORMAL LOW (ref 12.0–15.0)
MCH: 27.8 pg (ref 26.0–34.0)
MCHC: 32.1 g/dL (ref 30.0–36.0)
MCV: 86.6 fL (ref 78.0–100.0)
Platelets: 181 10*3/uL (ref 150–400)
RBC: 3.96 MIL/uL (ref 3.87–5.11)
RDW: 15.8 % — AB (ref 11.5–15.5)
WBC: 9.1 10*3/uL (ref 4.0–10.5)

## 2018-07-19 LAB — PROTIME-INR
INR: 1.41
Prothrombin Time: 17.2 seconds — ABNORMAL HIGH (ref 11.4–15.2)

## 2018-07-19 LAB — HEPARIN LEVEL (UNFRACTIONATED)
Heparin Unfractionated: 0.12 IU/mL — ABNORMAL LOW (ref 0.30–0.70)
Heparin Unfractionated: 0.41 IU/mL (ref 0.30–0.70)
Heparin Unfractionated: 0.49 IU/mL (ref 0.30–0.70)

## 2018-07-19 LAB — GLUCOSE, CAPILLARY
GLUCOSE-CAPILLARY: 159 mg/dL — AB (ref 70–99)
Glucose-Capillary: 128 mg/dL — ABNORMAL HIGH (ref 70–99)
Glucose-Capillary: 179 mg/dL — ABNORMAL HIGH (ref 70–99)
Glucose-Capillary: 152 mg/dL — ABNORMAL HIGH (ref 70–99)
Glucose-Capillary: 153 mg/dL — ABNORMAL HIGH (ref 70–99)
Glucose-Capillary: 167 mg/dL — ABNORMAL HIGH (ref 70–99)

## 2018-07-19 LAB — MAGNESIUM
MAGNESIUM: 2.1 mg/dL (ref 1.7–2.4)
Magnesium: 2 mg/dL (ref 1.7–2.4)

## 2018-07-19 LAB — PHOSPHORUS
Phosphorus: 2 mg/dL — ABNORMAL LOW (ref 2.5–4.6)
Phosphorus: 1.6 mg/dL — ABNORMAL LOW (ref 2.5–4.6)

## 2018-07-19 SURGERY — ENDARTERECTOMY, CAROTID
Anesthesia: General | Laterality: Left

## 2018-07-19 MED ORDER — VITAL AF 1.2 CAL PO LIQD
1000.0000 mL | ORAL | Status: DC
  Administered 2018-07-20 – 2018-07-22 (×2): 1000 mL

## 2018-07-19 NOTE — Progress Notes (Signed)
ANTICOAGULATION CONSULT NOTE - Follow Up Consult  Pharmacy Consult for Heparin Indication: stroke  Allergies  Allergen Reactions  . Carbamazepine Hives, Shortness Of Breath and Other (See Comments)  . Penicillins Anaphylaxis, Other (See Comments) and Swelling    Mother, father and brother have history of anaphylaxis reaction to penicillin so pt does not take  My throat swells Familial history of anaphylaxis   . Statins Other (See Comments)    Severe muscle weakness, leg numbness, severe headaches, chest pain   . Tricyclic Antidepressants Other (See Comments)    IMPAIRED MEMORY  . Atorvastatin Other (See Comments)    Severe muscle weakness, leg numbness, severe headaches, chest pain   . Ezetimibe Other (See Comments)    Severe muscle weakness, leg numbness, severe headaches, chest pain   . Nortriptyline Other (See Comments)    IMPAIRED MEMORY  . Quinapril Hcl Other (See Comments)    Unknown reaction  . Rifampin Diarrhea  . Metoclopramide Nausea And Vomiting and Rash    Patient Measurements: Height: 5\' 2"  (157.5 cm) Weight: 132 lb 4.4 oz (60 kg) IBW/kg (Calculated) : 50.1 Heparin Dosing Weight:  59 kg  Vital Signs: Temp: 98.1 F (36.7 C) (10/01 1200) Temp Source: Axillary (10/01 1200) BP: 166/112 (10/01 1200) Pulse Rate: 92 (10/01 1200)  Labs: Recent Labs    07/17/18 0637  07/18/18 0106 07/18/18 0235  07/18/18 1538 07/19/18 0230 07/19/18 0517 07/19/18 0756 07/19/18 1216  HGB 11.4*  --  8.5* 9.9*  --   --   --   --  11.0*  --   HCT 36.2  --  27.6* 32.0*  --   --   --   --  34.3*  --   PLT 230  --  123* 172  --   --   --   --  181  --   APTT  --   --  143*  --   --   --   --   --   --   --   LABPROT  --   --  21.0*  --   --   --   --  17.2*  --   --   INR  --   --  1.83  --   --   --   --  1.41  --   --   HEPARINUNFRC 0.29*   < > 0.21*  --    < > 0.91* 0.12*  --   --  0.41  CREATININE 1.05*  --   --  0.76  --   --   --  0.85  --   --    < > = values in  this interval not displayed.    Estimated Creatinine Clearance: 51.5 mL/min (by C-G formula based on SCr of 0.85 mg/dL).  Assessment: Anticoag: carotid thrombus, CVA, HgB and plts stable. No bleeding or infusion complications reported.   Heparin level now 0.41 units/ml which is within goal.    Goal of Therapy:  Heparin level 0.3-0.5 units/ml Monitor platelets by anticoagulation protocol: Yes   Plan:  Continue heparin to 550 units/hr Check heparin level in 8 hours   Alanda Slim, PharmD, [facility] Clinical Pharmacist Please see AMION for all Pharmacists' Contact Phone Numbers 07/19/2018, 2:29 PM

## 2018-07-19 NOTE — Progress Notes (Signed)
NAME:  [name], MRN:  [id number], DOB:  [birth year], LOS: 6 ADMISSION DATE:  07/13/2018, CONSULTATION DATE: 07/17/2018 REFERRING MD: Dr. Cheral Marker, Neurology, CHIEF COMPLAINT: Seizures  Brief History   67 year old woman, history of prior CVAs with carotid artery disease, hypertension, hyperlipidemia, normal pressure hydrocephalus status post shunt.  Admitted with seizures and a new left MCA territory stroke.  Treated with heparin and antiplatelets.  Had recurrent seizures and transferred to ICU 9/29 for Versed and seizure suppression.  Intubated and ventilated 9/29 to tolerate.  Past Medical History   has a past medical history of Anemia, Anesthesia complication, Diabetes mellitus, Family history of anesthesia complication (32 yrs ago), GERD (gastroesophageal reflux disease), Headache(784.0), Hypercholesteremia, Hypertension, Peripheral neuropathy, PONV (postoperative nausea and vomiting), Pseudotumor cerebri, Seizures ([place]), Synovitis of ankle (04/2013), and Wears dentures.  Significant Hospital Events   Intubated for burst suppression, sedation 9/29  Consults: date of consult/date signed off & final recs:  Neurology 9/25  Procedures (surgical and bedside):  ETT 9/29 >>   Significant Diagnostic Tests:  CT head 9/25 >> evolutionary changes of multiple subcortical infarcts largest in the high left frontal lobe MRI brain 9/25 >> acute multifocal left frontoparietal MCA territory infarcts, old left greater than right MCA territory infarcts, chronic small vessel ischemic changes CT angio head and neck 9/26 >> left ICA 60% stenosis, severe stenosis of the proximal left A1 segment, mild narrowing distal portion right MCA M1 segment CT head 9/28 >> acute on chronic left MCA territory infarct without hemorrhagic conversion, old right MCA territory infarct LTM EEG 9/29 - 9/30 >> generalized burst of cortical myoclonus, focal motor seizure activity  Micro Data:  None  Antimicrobials:   None  Subjective:  Currently sedated, intubated and on PRVC Versed discontinued this morning IV team attempted PICC on 9/30, unable to place  Objective   Blood pressure (!) 159/77, pulse 85, temperature 98.9 F (37.2 C), temperature source Axillary, resp. rate 14, height 5\' 2"  (1.575 m), weight 60 kg, SpO2 100 %.    Vent Mode: PSV;CPAP FiO2 (%):  [30 %] 30 % Set Rate:  [14 bmp] 14 bmp Vt Set:  [400 mL] 400 mL PEEP:  [5 cmH20] 5 cmH20 Pressure Support:  [10 cmH20] 10 cmH20 Plateau Pressure:  [11 cmH20-14 cmH20] 13 cmH20   Intake/Output Summary (Last 24 hours) at 07/19/2018 0940 Last data filed at 07/19/2018 0800 Gross per 24 hour  Intake 2489.3 ml  Output 1125 ml  Net 1364.3 ml   Filed Weights   07/13/18 1106 07/19/18 0434  Weight: 59 kg 60 kg    Examination: General: Ill-appearing woman, ventilated HENT: Tracheal tube in place, no oral secretions, no lesions Lungs: Decreased breath sounds at both bases, no wheezing, no crackles Cardiovascular: Regular, no murmur Abdomen: Soft, nondistended, positive bowel sounds, unable to assess tenderness Extremities: No edema, some bruising present on the dorsum of her right foot Neuro: Remains deeply sedated, does not grimace or move to pain, she does have spontaneous respiratory effort  Resolved Hospital Problem list   Shock in the setting of sedation  Assessment & Plan:   Acute respiratory failure due to renal status, inability to protect airway Tolerating pressure support ventilation 10/1 but does not have the mental status for an extubation yet.  Suspect we need to give more time for her sedation to wear off  Acute frequent seizures, improved based on continuous EEG History normal pressure hydrocephalus, shunt Appreciate neurology management Continue Keppra, Depakote changed to Vimpat on 9/30  per neurology plans Now off Versed drip Continuous EEG in place, improved  Acute left MCA stroke Chronic stroke, chronic left  carotid disease Currently on heparin infusion Timing of aspirin per neurology recommendations  Hypertension Labetalol when needed, goal SBP < 160  Mild non-anion gap metabolic acidosis Follow BMP, no intervention at this time  Relative thrombocytopenia, 324 >> 123 >> 181 on 10/1 Follow CBC (on heparin) May need to make a change from heparin/Pepcid depending on trend, hold off for now  Hypokalemia, improved Follow intermittent BMP  Hyperlipidemia Continue gemfibrozil  Hyperglycemia Lantus 10 units Sliding scale insulin as ordered   Disposition / Summary of Today's Plan 07/19/18   Antiepileptics as per neurology plans, note Versed discontinued 9/30.  Await improvement in mental status before we can start to consider spontaneous breathing trials and extubation.  Difficult IV access, we will order a PICC line on 9/30    Diet: N.p.o. Pain/Anxiety/Delirium protocol (if indicated): Fentanyl as needed VAP protocol (if indicated): In place DVT prophylaxis: Heparin drip (no thrombocytopenia) GI prophylaxis: Pepcid (note thrombocytopenia) Hyperglycemia protocol: Sliding scale insulin in place Mobility: Bedrest Code Status: Full Family Communication: Spoke with family, reviewed plans on 10/1   Labs   CBC: Recent Labs  Lab 07/13/18 1109  07/16/18 0429 07/17/18 0637 07/18/18 0106 07/18/18 0235 07/19/18 0756  WBC 10.8*   < > 7.1 8.8 5.1 6.5 9.1  NEUTROABS 7.5  --   --  5.9 3.4 4.5  --   HGB 13.8   < > 11.7* 11.4* 8.5* 9.9* 11.0*  HCT 44.7   < > 36.3 36.2 27.6* 32.0* 34.3*  MCV 88.5   < > 86.8 87.0 89.9 87.7 86.6  PLT 324   < > 241 230 123* 172 181   < > = values in this interval not displayed.    Basic Metabolic Panel: Recent Labs  Lab 07/13/18 1109 07/13/18 1125 07/13/18 2353 07/14/18 0805 07/17/18 6761 07/18/18 0235 07/18/18 2146 07/19/18 0517  NA 138 138  --  141 141 142  --  141  K 3.2* 3.5  --  3.6 3.5 2.7*  --  4.6  CL 100 103  --  111 107 115*  --  112*   CO2 24  --   --  21* 23 20*  --  18*  GLUCOSE 130* 128*  --  95 70 74  --  157*  BUN 38* 40*  --  16 9 <5*  --  9  CREATININE 1.27* 1.40*  --  0.88 1.05* 0.76  --  0.85  CALCIUM 9.9  --   --  9.0 9.4 7.4*  --  8.7*  MG  --   --  1.7  --   --  1.2* 2.4 2.1  PHOS  --   --  2.9  --   --   --  2.3* 2.0*   GFR: Estimated Creatinine Clearance: 51.5 mL/min (by C-G formula based on SCr of 0.85 mg/dL). Recent Labs  Lab 07/17/18 0637 07/18/18 0106 07/18/18 0235 07/19/18 0756  WBC 8.8 5.1 6.5 9.1    Liver Function Tests: Recent Labs  Lab 07/13/18 1109 07/17/18 0637 07/18/18 0235  AST 27 25 19   ALT 23 17 13   ALKPHOS 121 96 80  BILITOT 0.4 0.7 0.5  PROT 7.2 6.0* 5.1*  ALBUMIN 4.0 3.1* 2.6*   No results for input(s): LIPASE, AMYLASE in the last 168 hours. No results for input(s): AMMONIA in the last 168 hours.  ABG  Component Value Date/Time   PHART 7.345 (L) 07/18/2018 0452   PCO2ART 37.6 07/18/2018 0452   PO2ART 208.0 (H) 07/18/2018 0452   HCO3 20.6 07/18/2018 0452   TCO2 22 07/18/2018 0452   ACIDBASEDEF 5.0 (H) 07/18/2018 0452   O2SAT 100.0 07/18/2018 0452     Coagulation Profile: Recent Labs  Lab 07/13/18 1109 07/18/18 0106 07/19/18 0517  INR 1.00 1.83 1.41    Cardiac Enzymes: No results for input(s): CKTOTAL, CKMB, CKMBINDEX, TROPONINI in the last 168 hours.  HbA1C: Hgb A1c MFr Bld  Date/Time Value Ref Range Status  07/13/2018 11:53 PM 6.3 (H) 4.8 - 5.6 % Final    Comment:    (NOTE) Pre diabetes:          5.7%-6.4% Diabetes:              >6.4% Glycemic control for   <7.0% adults with diabetes   06/02/2017 07:41 AM 9.5 (H) 4.8 - 5.6 % Final    Comment:    (NOTE)         Prediabetes: 5.7 - 6.4         Diabetes: >6.4         Glycemic control for adults with diabetes: <7.0     CBG: Recent Labs  Lab 07/18/18 1943 07/18/18 2009 07/18/18 2328 07/19/18 0333 07/19/18 0802  GLUCAP 65* 128* 124* 128* 179*    Independent CC time 33  minutes  Baltazar Apo, MD, PhD 07/19/2018, 9:40 AM [facility] [phone] or if no answer [phone]

## 2018-07-19 NOTE — Progress Notes (Signed)
ANTICOAGULATION CONSULT NOTE - Follow Up Consult  Pharmacy Consult for Heparin Indication: stroke  Allergies  Allergen Reactions  . Carbamazepine Hives, Shortness Of Breath and Other (See Comments)  . Penicillins Anaphylaxis, Other (See Comments) and Swelling    Mother, father and brother have history of anaphylaxis reaction to penicillin so pt does not take  My throat swells Familial history of anaphylaxis   . Statins Other (See Comments)    Severe muscle weakness, leg numbness, severe headaches, chest pain   . Tricyclic Antidepressants Other (See Comments)    IMPAIRED MEMORY  . Atorvastatin Other (See Comments)    Severe muscle weakness, leg numbness, severe headaches, chest pain   . Ezetimibe Other (See Comments)    Severe muscle weakness, leg numbness, severe headaches, chest pain   . Nortriptyline Other (See Comments)    IMPAIRED MEMORY  . Quinapril Hcl Other (See Comments)    Unknown reaction  . Rifampin Diarrhea  . Metoclopramide Nausea And Vomiting and Rash    Patient Measurements: Height: 5\' 2"  (157.5 cm) Weight: 132 lb 4.4 oz (60 kg) IBW/kg (Calculated) : 50.1 Heparin Dosing Weight:  59 kg  Vital Signs: Temp: 98.6 F (37 C) (10/01 2000) Temp Source: Axillary (10/01 2000) BP: 138/70 (10/01 2100) Pulse Rate: 87 (10/01 2100)  Labs: Recent Labs    07/17/18 7867  07/18/18 0106 07/18/18 0235  07/19/18 0230 07/19/18 0517 07/19/18 0756 07/19/18 1216 07/19/18 2013  HGB 11.4*  --  8.5* 9.9*  --   --   --  11.0*  --   --   HCT 36.2  --  27.6* 32.0*  --   --   --  34.3*  --   --   PLT 230  --  123* 172  --   --   --  181  --   --   APTT  --   --  143*  --   --   --   --   --   --   --   LABPROT  --   --  21.0*  --   --   --  17.2*  --   --   --   INR  --   --  1.83  --   --   --  1.41  --   --   --   HEPARINUNFRC 0.29*   < > 0.21*  --    < > 0.12*  --   --  0.41 0.49  CREATININE 1.05*  --   --  0.76  --   --  0.85  --   --   --    < > = values in this  interval not displayed.    Estimated Creatinine Clearance: 51.5 mL/min (by C-G formula based on SCr of 0.85 mg/dL).  Assessment: Anticoag: carotid thrombus, CVA, HgB and plts stable. No bleeding or infusion complications reported.   Confirmatory heparin level remains therapeutic at 0.49.    Goal of Therapy:  Heparin level 0.3-0.5 units/ml Monitor platelets by anticoagulation protocol: Yes   Plan:  Continue heparin 550 units/hr Check heparin level and CBC daily  Arrie Senate, PharmD, BCPS Clinical Pharmacist [phone] Please check AMION for all [facility] Pharmacy numbers 07/19/2018

## 2018-07-19 NOTE — Procedures (Signed)
Electroencephalogram report- LTM  Ordering Physician : Dr. Leonel Ramsay    Beginning date or time: 07/18/2018 0730 hrs. Ending date or time: 07/19/2018 0730 hrs.  Day of study: 2  Medications include: Per EMR, Versed drip, Keppra  MENTAL STATUS (per technician's notes): Intubated, sedated  HISTORY: This 24 hours of intensive EEG monitoring with simultaneous video monitoring was performed for this patient with stroke and altered mental status. This EEG was requested to rule out subclinical electrographic seizures.  TECHNICAL DESCRIPTION:  The study consists of a continuous 16-channel multi-montage digital video EEG recording with twenty-one electrodes placed according to the International 10-20 System. Additional leads included eye leads, true temporal leads (T1, T2), and an EKG lead. Activation procedures were not done due to mental status.  REPORT: The background activity in this tracing consists of low voltage burst suppression pattern, with 3 to 4 seconds of suppression in between the bursts.  No clear focal slowing or epileptiform activity was seen.  IMPRESSION: This is an abnormal EEG due to diffuse slowing.  CLINICAL CORRELATION: This EEG is consistent with non-specific severe diffuse cerebral dysfunction, at least in part due to sedating medications. No electrographic seizures were seen.

## 2018-07-19 NOTE — Progress Notes (Signed)
PT Cancellation Note  Patient Details Name: [name] MRN: [id number] DOB: [birth year]   Cancelled Treatment:    Reason Eval/Treat Not Completed: Patient not medically ready;Medical issues which prohibited therapy.  Pt is not responsive.  Will see tomorrow is able. 07/19/2018  Donnella Sham, PT Acute Rehabilitation Services [phone]  (pager) [phone]  (office)   Tessie Fass Emmerich Cryer 07/19/2018, 4:05 PM

## 2018-07-19 NOTE — Progress Notes (Signed)
Nutrition Follow-up  DOCUMENTATION CODES:   Not applicable  INTERVENTION:   Vital AF 1.2 @ 40 ml/hr (960 ml/day) via OG tube  Provides: 1152 kcal, 84 grams protein, and 802 ml free water  NUTRITION DIAGNOSIS:   Inadequate oral intake related to inability to eat as evidenced by NPO status. Ongoing.   GOAL:   Patient will meet greater than or equal to 90% of their needs Progressing   MONITOR:   TF tolerance, Vent status  ASSESSMENT:   67 y.o. female with medical history significant of DM 2, CVA, anemia, GERD, HLD, HTN seizures disorder.Pseudotumor cerebri, chronic refractory headaches, debility, sp PEG and Trach now removed. MRI confirmed new CVA  Pt with new CVA developed seizures, transferred to the ICU for versed drip and intubated 9/29.  10/1 start TF as pt will remain on vent for now  Patient is currently intubated on ventilator support MV: 6 L/min Temp (24hrs), Avg:98.1 F (36.7 C), Min:97.4 F (36.3 C), Max:98.9 F (37.2 C)  Medications reviewed and include: 10 units lantus, SSI  Labs reviewed: PO4 2 (L)    Diet Order:   Diet Order            Diet NPO time specified  Diet effective now              EDUCATION NEEDS:   Not appropriate for education at this time  Skin:  Skin Assessment: Reviewed RN Assessment  Last BM:  9/30  Height:   Ht Readings from Last 1 Encounters:  07/13/18 5\' 2"  (1.575 m)    Weight:   Wt Readings from Last 1 Encounters:  07/19/18 60 kg    Ideal Body Weight:  50 kg  BMI:  Body mass index is 24.19 kg/m.  Estimated Nutritional Needs:   Kcal:  1130  Protein:  70-85 grams  Fluid:  > 1.5 L/day  [name] RD, LDN, CNSC [phone] Pager [phone] After Hours Pager

## 2018-07-19 NOTE — Progress Notes (Signed)
200 cc IV Versed wasted in sink as witnessed by Lianne Bushy, RN.

## 2018-07-19 NOTE — Progress Notes (Signed)
Subjective: Single event overnight, unclear etiology  Exam: Vitals:   07/19/18 0752 07/19/18 0800  BP: (!) 145/86 (!) 159/77  Pulse: 86 85  Resp: 14 14  Temp: 98.9 F (37.2 C)   SpO2: 100% 100%   Gen: In bed, intubated Resp: Ventilated Abd: soft, nt  Neuro: MS: Does not open eyes or follow commands, but does grimace to nox stim.  CN: Pupils equal round reactive, eyes are disconjugate Motor: withdraws to noxious  Sensory: As above  Pertinent Labs: Hypokalemic at 2.7  Impression: 67 year old female with frequent clinical seizures following recent infarcts.  Due to the frequent nature and her failure to respond to initial antiepileptics, she was started on high-dose Versed with cessation of these discharges.    Recommendations: 1) continue Keppra 1 g twice daily 2) hold Versed 3) continue Depacon  This patient is critically ill and at significant risk of neurological worsening, death and care requires constant monitoring of vital signs, hemodynamics,respiratory and cardiac monitoring, neurological assessment, discussion with family, other specialists and medical decision making of high complexity. I spent 32 minutes of neurocritical care time  in the care of  this patient.  Roland Rack, MD [facility] [phone]  If 7pm- 7am, please page neurology on call as listed in [place]. 07/19/2018  9:19 AM

## 2018-07-19 NOTE — Progress Notes (Signed)
ANTICOAGULATION CONSULT NOTE - Follow Up Consult  Pharmacy Consult for Heparin Indication: stroke  Allergies  Allergen Reactions  . Carbamazepine Hives, Shortness Of Breath and Other (See Comments)  . Penicillins Anaphylaxis, Other (See Comments) and Swelling    Mother, father and brother have history of anaphylaxis reaction to penicillin so pt does not take  My throat swells Familial history of anaphylaxis   . Statins Other (See Comments)    Severe muscle weakness, leg numbness, severe headaches, chest pain   . Tricyclic Antidepressants Other (See Comments)    IMPAIRED MEMORY  . Atorvastatin Other (See Comments)    Severe muscle weakness, leg numbness, severe headaches, chest pain   . Ezetimibe Other (See Comments)    Severe muscle weakness, leg numbness, severe headaches, chest pain   . Nortriptyline Other (See Comments)    IMPAIRED MEMORY  . Quinapril Hcl Other (See Comments)    Unknown reaction  . Rifampin Diarrhea  . Metoclopramide Nausea And Vomiting and Rash    Patient Measurements: Height: 5\' 2"  (157.5 cm) Weight: 130 lb (59 kg) IBW/kg (Calculated) : 50.1 Heparin Dosing Weight:  59 kg  Vital Signs: Temp: 98.4 F (36.9 C) (10/01 0000) Temp Source: Axillary (10/01 0000) BP: 144/84 (10/01 0100) Pulse Rate: 79 (10/01 0100)  Labs: Recent Labs    07/17/18 0637  07/18/18 0106 07/18/18 0235 07/18/18 0836 07/18/18 1538 07/19/18 0230  HGB 11.4*  --  8.5* 9.9*  --   --   --   HCT 36.2  --  27.6* 32.0*  --   --   --   PLT 230  --  123* 172  --   --   --   APTT  --   --  143*  --   --   --   --   LABPROT  --   --  21.0*  --   --   --   --   INR  --   --  1.83  --   --   --   --   HEPARINUNFRC 0.29*   < > 0.21*  --  0.58 0.91* 0.12*  CREATININE 1.05*  --   --  0.76  --   --   --    < > = values in this interval not displayed.    Estimated Creatinine Clearance: 54.7 mL/min (by C-G formula based on SCr of 0.76 mg/dL).  Assessment: Anticoag: carotid  thrombus, CVA, HgB and plts stable. No bleeding or infusion complications reported.  Heparin level now 0.12 units/ml.   Goal of Therapy:  Heparin level 0.3-0.5 units/ml Monitor platelets by anticoagulation protocol: Yes   Plan:  Increase heparin to 550 units/ml Check heparin level in 6-8 hours  [name] 07/19/2018,3:27 AM

## 2018-07-19 NOTE — Progress Notes (Signed)
OT Cancellation Note  Patient Details Name: [name] MRN: [id number] DOB: Mar 15, 1951   Cancelled Treatment:    Reason Eval/Treat Not Completed: Patient not medically ready. Intubated/sedated.   Ramond Dial, OT/L   Acute OT Clinical Specialist Acute Rehabilitation Services Pager [phone] Office [phone]  07/19/2018, 2:11 PM

## 2018-07-20 DIAGNOSIS — I639 Cerebral infarction, unspecified: Secondary | ICD-10-CM

## 2018-07-20 DIAGNOSIS — G40901 Epilepsy, unspecified, not intractable, with status epilepticus: Secondary | ICD-10-CM

## 2018-07-20 LAB — PHOSPHORUS: PHOSPHORUS: 1.7 mg/dL — AB (ref 2.5–4.6)

## 2018-07-20 LAB — BASIC METABOLIC PANEL
Anion gap: 7 (ref 5–15)
BUN: 16 mg/dL (ref 8–23)
CALCIUM: 8.3 mg/dL — AB (ref 8.9–10.3)
CO2: 22 mmol/L (ref 22–32)
CREATININE: 0.65 mg/dL (ref 0.44–1.00)
Chloride: 109 mmol/L (ref 98–111)
GFR calc non Af Amer: >60 mL/min (ref >60)
Glucose, Bld: 184 mg/dL — ABNORMAL HIGH (ref 70–99)
Potassium: 3.5 mmol/L (ref 3.5–5.1)
SODIUM: 138 mmol/L (ref 135–145)

## 2018-07-20 LAB — GLUCOSE, CAPILLARY
GLUCOSE-CAPILLARY: 141 mg/dL — AB (ref 70–99)
GLUCOSE-CAPILLARY: 162 mg/dL — AB (ref 70–99)
GLUCOSE-CAPILLARY: 171 mg/dL — AB (ref 70–99)
Glucose-Capillary: 150 mg/dL — ABNORMAL HIGH (ref 70–99)
Glucose-Capillary: 166 mg/dL — ABNORMAL HIGH (ref 70–99)
Glucose-Capillary: 158 mg/dL — ABNORMAL HIGH (ref 70–99)

## 2018-07-20 LAB — CBC
HCT: 30.1 % — ABNORMAL LOW (ref 36.0–46.0)
Hemoglobin: 9.4 g/dL — ABNORMAL LOW (ref 12.0–15.0)
MCH: 27.6 pg (ref 26.0–34.0)
MCHC: 31.2 g/dL (ref 30.0–36.0)
MCV: 88.3 fL (ref 78.0–100.0)
Platelets: 163 10*3/uL (ref 150–400)
RBC: 3.41 MIL/uL — AB (ref 3.87–5.11)
RDW: 16 % — ABNORMAL HIGH (ref 11.5–15.5)
WBC: 7.6 10*3/uL (ref 4.0–10.5)

## 2018-07-20 LAB — MAGNESIUM: MAGNESIUM: 1.8 mg/dL (ref 1.7–2.4)

## 2018-07-20 LAB — HEPARIN LEVEL (UNFRACTIONATED): Heparin Unfractionated: 0.41 IU/mL (ref 0.30–0.70)

## 2018-07-20 MED ORDER — POTASSIUM PHOSPHATES 15 MMOLE/5ML IV SOLN
30.0000 mmol | Freq: Once | INTRAVENOUS | Status: AC
  Administered 2018-07-20: 30 mmol via INTRAVENOUS
  Filled 2018-07-20: qty 10

## 2018-07-20 NOTE — Progress Notes (Signed)
ANTICOAGULATION CONSULT NOTE - Follow Up Consult  Pharmacy Consult for Heparin Indication: stroke  Patient Measurements: Height: 5\' 2"  (157.5 cm) Weight: 132 lb 7.9 oz (60.1 kg) IBW/kg (Calculated) : 50.1 Heparin Dosing Weight:  59 kg  Vital Signs: Temp: 99.2 F (37.3 C) (10/02 0800) Temp Source: Axillary (10/02 0800) BP: 124/64 (10/02 0830) Pulse Rate: 88 (10/02 0830)  Labs: Recent Labs    07/18/18 0106 07/18/18 0235  07/19/18 0517 07/19/18 0756 07/19/18 1216 07/19/18 2013 07/20/18 0546  HGB 8.5* 9.9*  --   --  11.0*  --   --  9.4*  HCT 27.6* 32.0*  --   --  34.3*  --   --  30.1*  PLT 123* 172  --   --  181  --   --  163  APTT 143*  --   --   --   --   --   --   --   LABPROT 21.0*  --   --  17.2*  --   --   --   --   INR 1.83  --   --  1.41  --   --   --   --   HEPARINUNFRC 0.21*  --    < >  --   --  0.41 0.49 0.41  CREATININE  --  0.76  --  0.85  --   --   --  0.65   < > = values in this interval not displayed.    Estimated Creatinine Clearance: 54.7 mL/min (by C-G formula based on SCr of 0.65 mg/dL).  Assessment: 73 yof continues on IV heparin for carotid thrombus and CVA. Heparin level remains therapeutic. Hgb down slightly and platelets are WNL. No bleeding noted.     Goal of Therapy:  Heparin level 0.3-0.5 units/ml Monitor platelets by anticoagulation protocol: Yes   Plan:  Continue heparin 550 units/hr Check heparin level and CBC daily  Salome Arnt, PharmD, BCPS Please see AMION for all pharmacy numbers 07/20/2018 9:41 AM

## 2018-07-20 NOTE — Evaluation (Signed)
Physical Therapy Re-Evaluation Patient Details Name: [name] MRN: [id number] DOB: 1951-08-27 Today's Date: 07/20/2018   History of Present Illness  Patient is a 67 y/o female presenting to the ED on 07/13/18 with primary complaints of difficulty with speech. Patient with a PMH significant for DM 2, CVA, anemia, GERD, HLD, HTN seizures disorder.Pseudotumor cerebri, chronic refractory headaches, debility, sp PEG and Trach now removed. Initial CT head unremarkable. MRI revealing Acute multifocal LEFT frontoparietal/MCA territory infarcts.  Developed seizured on 9/29 and was intubated and sedated.  No longer sedated as of 9/30, on 10/2 tolerating SBT and PS, but awaiting improved mental status prior to extubation.  Clinical Impression  Patient presents with decreased mobility over last sessions due to seizures, decreased arousal, mechanical ventilation and prolonged bedrest.  She was not interactive this session and remains intubated though on pressure support.  She will continue to need skilled PT in the acute setting and hope to improve mental status to remain appropriate for CIR level rehab when ready for d/c.      Follow Up Recommendations CIR    Equipment Recommendations  None recommended by PT    Recommendations for Other Services       Precautions / Restrictions Precautions Precautions: Fall Restrictions Other Position/Activity Restrictions: husband reporting a fall in the home recently - noted yellow/purple bruising to R foot      Mobility  Bed Mobility Overal bed mobility: Needs Assistance Bed Mobility: Rolling;Sidelying to Sit;Sit to Supine Rolling: Total assist;+2 for safety/equipment Sidelying to sit: Total assist;+2 for physical assistance   Sit to supine: Total assist;+2 for physical assistance   General bed mobility comments: patient not able to assist  Transfers                 General transfer comment: NT  Ambulation/Gait                 Stairs            Wheelchair Mobility    Modified Rankin (Stroke Patients Only) Modified Rankin (Stroke Patients Only) Pre-Morbid Rankin Score: Moderately severe disability Modified Rankin: Severe disability     Balance Overall balance assessment: Needs assistance Sitting-balance support: Feet unsupported Sitting balance-Leahy Scale: Zero Sitting balance - Comments: no attempt to assist with sitting balance or head righting throughout session about 6 minutes up at EOB with +2 total A; not opening eyes despite cues from therapists and family                                     Pertinent Vitals/Pain Pain Assessment: Faces Faces Pain Scale: Hurts little more Pain Location: R foot with movement Pain Descriptors / Indicators: Grimacing Pain Intervention(s): Repositioned;Monitored during session    Home Living Family/patient expects to be discharged to:: Private residence Living Arrangements: Spouse/significant other;Children Available Help at Discharge: Family;Available PRN/intermittently Type of Home: Mobile home Home Access: Stairs to enter Entrance Stairs-Rails: Can reach both Entrance Stairs-Number of Steps: 4 Home Layout: One level Home Equipment: Walker - 2 wheels;Bedside commode;Grab bars - tub/shower;Tub bench      Prior Function Level of Independence: Needs assistance   Gait / Transfers Assistance Needed: would only walk with walker and w/c following or with hand support  ADL's / Homemaking Assistance Needed: able to complete eating/grooming without assist, requires assistance for UB/LB dressing/bathing and sometimes assist for toileting (depending on the day)   Comments: fell  three weeks prior to admission and had R foot contusion, has not walked since due to fear of falling, just pivoted to chair with help     Hand Dominance        Extremity/Trunk Assessment   Upper Extremity Assessment Upper Extremity Assessment: Defer to OT  evaluation    Lower Extremity Assessment Lower Extremity Assessment: RLE deficits/detail;LLE deficits/detail RLE Deficits / Details: PROM grossly WFL, but increased tone throughout and stiffness in ankle plantarflexors with brusing along top and side of R foot from fall 3 weeks prior to admission LLE Deficits / Details: PROM grossly WFL    Cervical / Trunk Assessment Cervical / Trunk Assessment: Other exceptions Cervical / Trunk Exceptions: does not hold head up in sitting, defalts to R rotation in supine due to ventilator tubing on R  Communication   Communication: Other (comment)(intubated)  Cognition Arousal/Alertness: Lethargic Behavior During Therapy: Flat affect Overall Cognitive Status: Difficult to assess                                        General Comments General comments (skin integrity, edema, etc.): spouse and daughter present, daughter with recent knee surgery    Exercises Other Exercises Other Exercises: PROM bilateral hips/knees/ankles in supine   Assessment/Plan    PT Assessment Patient needs continued PT services  PT Problem List Decreased strength;Decreased mobility;Other (comment);Decreased activity tolerance;Decreased cognition;Decreased balance;Decreased knowledge of use of DME(decreased level of arousal)       PT Treatment Interventions DME instruction;Therapeutic activities;Cognitive remediation;Therapeutic exercise;Patient/family education;Balance training;Functional mobility training;Neuromuscular re-education    PT Goals (Current goals can be found in the Care Plan section)  Acute Rehab PT Goals Patient Stated Goal: family continue to want pt to recover and go to rehab PT Goal Formulation: Patient unable to participate in goal setting Time For Goal Achievement: 08/03/18 Potential to Achieve Goals: Fair    Frequency Min 4X/week   Barriers to discharge        Co-evaluation               AM-PAC PT "6 Clicks" Daily  Activity  Outcome Measure Difficulty turning over in bed (including adjusting bedclothes, sheets and blankets)?: Unable Difficulty moving from lying on back to sitting on the side of the bed? : Unable Difficulty sitting down on and standing up from a chair with arms (e.g., wheelchair, bedside commode, etc,.)?: Unable Help needed moving to and from a bed to chair (including a wheelchair)?: Total Help needed walking in hospital room?: Total Help needed climbing 3-5 steps with a railing? : Total 6 Click Score: 6    End of Session   Activity Tolerance: Patient limited by lethargy Patient left: in bed;with family/visitor present;with bed alarm set   PT Visit Diagnosis: Other abnormalities of gait and mobility (R26.89);History of falling (Z91.81);Other symptoms and signs involving the nervous system (R29.898);Muscle weakness (generalized) (M62.81)    Time: 6834-1962 PT Time Calculation (min) (ACUTE ONLY): 40 min   Charges:   PT Evaluation $PT Re-evaluation: [place], PT Acute Rehabilitation Services [phone] 07/20/2018   Reginia Naas 07/20/2018, 1:24 PM

## 2018-07-20 NOTE — Evaluation (Signed)
Occupational Therapy Evaluation Patient Details Name: [name] MRN: [id number] DOB: Nov 01, 1950 Today's Date: 07/20/2018    History of Present Illness Patient is a 67 y/o female presenting to the ED on 07/13/18 with primary complaints of difficulty with speech. Patient with a PMH significant for DM 2, CVA, anemia, GERD, HLD, HTN seizures disorder.Pseudotumor cerebri, chronic refractory headaches, debility, sp PEG and Trach now removed. Initial CT head unremarkable. MRI revealing Acute multifocal LEFT frontoparietal/MCA territory infarcts.  Developed seizured on 9/29 and was intubated and sedated.  No longer sedated as of 9/30, on 10/2 tolerating SBT and PS, but awaiting improved mental status prior to extubation.   Clinical Impression   Pt presenting with decreased functional status since previous OT session including increased lethargy, mobility and functional performance. Pt demonstrates occasional movements of extremities during session though are nonpurposeful movements. Pt maintaining eyes closed during session and does not arouse despite max stimulation. Currently requires totalA+2 for bed mobility and totalA for self-care tasks. Will continue to follow acutely to monitor progress/ability to participate in therapies. Pt family hopeful for CIR post acute when pt stable for d/c.     Follow Up Recommendations  CIR;Supervision/Assistance - 24 hour(will continue to assess)    Equipment Recommendations  None recommended by OT           Precautions / Restrictions Precautions Precautions: Fall Restrictions Weight Bearing Restrictions: No Other Position/Activity Restrictions: husband reporting a fall in the home recently - noted yellow/purple bruising to R foot      Mobility Bed Mobility Overal bed mobility: Needs Assistance Bed Mobility: Rolling;Sidelying to Sit;Sit to Supine Rolling: Total assist;+2 for safety/equipment Sidelying to sit: Total assist;+2 for physical  assistance   Sit to supine: Total assist;+2 for physical assistance   General bed mobility comments: patient not able to assist  Transfers                 General transfer comment: NT    Balance Overall balance assessment: Needs assistance Sitting-balance support: Feet unsupported Sitting balance-Leahy Scale: Zero Sitting balance - Comments: no attempt to assist with sitting balance or head righting throughout session about 6 minutes up at EOB with +2 total A; not opening eyes despite cues from therapists and family                                   ADL either performed or assessed with clinical judgement   ADL Overall ADL's : Needs assistance/impaired Eating/Feeding: NPO                                     General ADL Comments: Pt currently requiring totalA for all aspects of ADLs; difficult to arouse this session despite max multimodal cues; completed bed mobility for changing bed pad and gown as pt incontinent of urine     Vision   Additional Comments: pt with eyes closed throughout session; will continue to assess      Perception     Praxis      Pertinent Vitals/Pain Pain Assessment: Faces Faces Pain Scale: Hurts little more Pain Location: R foot with movement Pain Descriptors / Indicators: Grimacing Pain Intervention(s): Monitored during session;Repositioned     Hand Dominance Left   Extremity/Trunk Assessment Upper Extremity Assessment Upper Extremity Assessment: RUE deficits/detail;LUE deficits/detail;Difficult to assess due to impaired cognition RUE Deficits /  Details: increased tone noted with shoulder flexion/horizontal abduction LUE Deficits / Details: increased edema noted in LUE; stiffness noted in shoulder ROM up to 90*; pt with slightly spastic jerking/movements in LUE and digits   Lower Extremity Assessment Lower Extremity Assessment: Defer to PT evaluation RLE Deficits / Details: PROM grossly WFL, but increased  tone throughout and stiffness in ankle plantarflexors with brusing along top and side of R foot from fall 3 weeks prior to admission LLE Deficits / Details: PROM grossly WFL   Cervical / Trunk Assessment Cervical / Trunk Assessment: Other exceptions Cervical / Trunk Exceptions: does not hold head up in sitting, defalts to R rotation in supine due to ventilator tubing on R   Communication Communication Communication: Other (comment)(intubated)   Cognition Arousal/Alertness: Lethargic Behavior During Therapy: Flat affect Overall Cognitive Status: Difficult to assess                                     General Comments  spouse and daughter present during session, daughter with recent knee surgery    Exercises Other Exercises Other Exercises: PROM bil UEs   Shoulder Instructions      Home Living Family/patient expects to be discharged to:: Private residence Living Arrangements: Spouse/significant other;Children Available Help at Discharge: Family;Available PRN/intermittently Type of Home: Mobile home Home Access: Stairs to enter Entrance Stairs-Number of Steps: 4 Entrance Stairs-Rails: Can reach both Home Layout: One level     Bathroom Shower/Tub: Teacher, early years/pre: Standard     Home Equipment: Environmental consultant - 2 wheels;Bedside commode;Grab bars - tub/shower;Tub bench      Lives With: Spouse;Family    Prior Functioning/Environment Level of Independence: Needs assistance  Gait / Transfers Assistance Needed: would only walk with walker and w/c following or with hand support ADL's / Homemaking Assistance Needed: able to complete eating/grooming without assist, requires assistance for UB/LB dressing/bathing and sometimes assist for toileting (depending on the day)    Comments: fell three weeks prior to admission and had R foot contusion, has not walked since due to fear of falling, just pivoted to chair with help        OT Problem List: Decreased  strength;Decreased range of motion;Decreased activity tolerance;Impaired balance (sitting and/or standing);Decreased coordination;Decreased cognition;Decreased safety awareness;Decreased knowledge of precautions;Decreased knowledge of use of DME or AE;Pain;Impaired vision/perception      OT Treatment/Interventions: Self-care/ADL training;Therapeutic exercise;Neuromuscular education;Energy conservation;DME and/or AE instruction;Therapeutic activities;Cognitive remediation/compensation;Patient/family education;Balance training    OT Goals(Current goals can be found in the care plan section) Acute Rehab OT Goals Patient Stated Goal: family continue to want pt to recover and go to rehab OT Goal Formulation: With family Time For Goal Achievement: 07/28/18 Potential to Achieve Goals: Fair  OT Frequency: Min 2X/week   Barriers to D/C:            Co-evaluation PT/OT/SLP Co-Evaluation/Treatment: Yes Reason for Co-Treatment: Complexity of the patient's impairments (multi-system involvement);Necessary to address cognition/behavior during functional activity;For patient/therapist safety;To address functional/ADL transfers   OT goals addressed during session: Strengthening/ROM      AM-PAC PT "6 Clicks" Daily Activity     Outcome Measure Help from another person eating meals?: Total Help from another person taking care of personal grooming?: Total Help from another person toileting, which includes using toliet, bedpan, or urinal?: Total Help from another person bathing (including washing, rinsing, drying)?: Total Help from another person to put on and taking  off regular upper body clothing?: Total Help from another person to put on and taking off regular lower body clothing?: Total 6 Click Score: 6   End of Session Nurse Communication: Mobility status  Activity Tolerance: Patient limited by lethargy Patient left: in bed;with call bell/phone within reach;with bed alarm set;with family/visitor  present  OT Visit Diagnosis: Muscle weakness (generalized) (M62.81);Hemiplegia and hemiparesis Hemiplegia - Right/Left: Right Hemiplegia - caused by: Cerebral infarction                Time: 1202-1242 OT Time Calculation (min): 40 min Charges:  OT General Charges $OT Visit: 1 Visit OT Evaluation $OT Re-eval: 1 Re-eval  [name], OT Supplemental Rehabilitation Services Pager [phone] Office [phone]   [name] 07/20/2018, 1:57 PM

## 2018-07-20 NOTE — Progress Notes (Signed)
Subjective: No further events.   Exam: Vitals:   07/20/18 0800 07/20/18 0830  BP: (!) 179/87 124/64  Pulse: 99 88  Resp: (!) 21 16  Temp: 99.2 F (37.3 C)   SpO2: 98% 100%   Gen: In bed, intubated Resp: Ventilated Abd: soft, nt  Neuro: MS: Opens eyes partialy to noxious stimulation, sdoes not follow commands, but slightly more responsive than yesterday.  CN: Pupils equal round reactive, eyes are disconjugate, she does look to the right today.  Motor: withdraws to noxious stimulation, moves left more than right.  DTR: brisk throughout right side.  Sensory: As above  Pertinent Labs: Cr 0.65  Impression: 67 year old female with frequent clinical seizures following recent infarcts.  Due to the frequent nature and her failure to respond to initial antiepileptics, she was started on high-dose Versed with cessation of these discharges.  She has not had any further episodes concerning for seizure since that was started.  She is taking Mariann Laster wake up, but given the dose of Versed that she received, it is still possible that this is simply sedation effect.  Recommendations: 1) continue Keppra 1 g twice daily 2) continue vimpat  This patient is critically ill and at significant risk of neurological worsening, death and care requires constant monitoring of vital signs, hemodynamics,respiratory and cardiac monitoring, neurological assessment, discussion with family, other specialists and medical decision making of high complexity. I spent 35 minutes of neurocritical care time  in the care of  this patient.  Roland Rack, MD [facility] [phone]  If 7pm- 7am, please page neurology on call as listed in [place]. 07/20/2018  9:40 AM

## 2018-07-20 NOTE — Progress Notes (Signed)
vLTM EEG complete no skin breakdown 

## 2018-07-20 NOTE — Progress Notes (Signed)
NAME:  [name], MRN:  [id number], DOB:  [birth year], LOS: 7 ADMISSION DATE:  07/13/2018, CONSULTATION DATE: 07/17/2018 REFERRING MD: Dr. Cheral Marker, Neurology, CHIEF COMPLAINT: Seizures  Brief History   67 year old woman, history of prior CVAs with carotid artery disease, hypertension, hyperlipidemia, normal pressure hydrocephalus status post shunt.  Admitted with seizures and a new left MCA territory stroke.  Treated with heparin and antiplatelets.  Had recurrent seizures and transferred to ICU 9/29 for Versed and seizure suppression.  Intubated and ventilated 9/29 to tolerate.  Significant Hospital Events   Intubated for burst suppression, sedation 9/29  Consults: date of consult/date signed off & final recs:  Neurology 9/25  Procedures (surgical and bedside):  ETT 9/29 >>   Significant Diagnostic Tests:  CT head 9/25 >> evolutionary changes of multiple subcortical infarcts largest in the high left frontal lobe MRI brain 9/25 >> acute multifocal left frontoparietal MCA territory infarcts, old left greater than right MCA territory infarcts, chronic small vessel ischemic changes CT angio head and neck 9/26 >> left ICA 60% stenosis, severe stenosis of the proximal left A1 segment, mild narrowing distal portion right MCA M1 segment CT head 9/28 >> acute on chronic left MCA territory infarct without hemorrhagic conversion, old right MCA territory infarct LTM EEG 9/29 - 9/30 >> generalized burst of cortical myoclonus, focal motor seizure activity  Micro Data:  None  Antimicrobials:  None  Subjective:  Tolerating PSV at 8/5 this AM; however, remains hypersomnolent.  Vitals stable.  Objective   Blood pressure 124/64, pulse 88, temperature 99.2 F (37.3 C), temperature source Axillary, resp. rate 16, height 5\' 2"  (1.575 m), weight 60.1 kg, SpO2 100 %.    Vent Mode: PSV;CPAP FiO2 (%):  [30 %] 30 % Set Rate:  [14 bmp] 14 bmp Vt Set:  [400 mL] 400 mL PEEP:  [5 cmH20] 5  cmH20 Pressure Support:  [8 cmH20-10 cmH20] 8 cmH20 Plateau Pressure:  [11 cmH20-12 cmH20] 12 cmH20   Intake/Output Summary (Last 24 hours) at 07/20/2018 2505 Last data filed at 07/20/2018 0800 Gross per 24 hour  Intake 835.91 ml  Output 745 ml  Net 90.91 ml   Filed Weights   07/13/18 1106 07/19/18 0434 07/20/18 0500  Weight: 59 kg 60 kg 60.1 kg    Examination: General: Ill-appearing woman, ventilated, in NAD HENT: Tracheal tube in place, no oral secretions, no lesions Lungs: Normal effort, CTAB Cardiovascular: Regular, no murmur Abdomen: Soft, nondistended, positive bowel sounds, unable to assess tenderness Extremities: No edema, some bruising present on the dorsum of her right foot Neuro: Remains sedated, does not grimace or move to pain, she does have spontaneous respiratory effort  Resolved Hospital Problem list   Shock in the setting of sedation  Assessment & Plan:   Acute respiratory failure due to renal status, inability to protect airway Tolerating pressure support ventilation at 8/5 this AM 10/2 a but does not have the mental status for an extubation yet.  Suspect we need to give more time for her sedation to wear off.  Will revisit this PM or tomorrow AM 10/3.  Acute frequent seizures, improved based on continuous EEG History normal pressure hydrocephalus, shunt Appreciate neurology management Continue Keppra, Vimpat Continuous EEG in place, improved  Acute left MCA stroke Chronic stroke, chronic left carotid disease Continue heparin infusion Timing of aspirin per neurology recommendations  Hypertension Labetalol when needed, goal SBP < 160  Relative thrombocytopenia, 324 >> 123 >> 181 on 10/1 Follow CBC (on heparin) May need  to make a change from heparin/Pepcid depending on trend, hold off for now  Hypophosphatemia 26mmol K[place]os now Follow BMP  Hyperlipidemia Continue gemfibrozil  Hyperglycemia Continue lantus 10 units and SSI   Disposition /  Summary of Today's Plan 07/20/18   Antiepileptics as per neurology plans.  Tolerating SBT on PSV 8/5 but await improvement in mental status before we can start to consider extubation.     Diet: N.p.o. Pain/Anxiety/Delirium protocol (if indicated): Fentanyl as needed VAP protocol (if indicated): In place DVT prophylaxis: Heparin drip (no thrombocytopenia) GI prophylaxis: Pepcid (note thrombocytopenia) Hyperglycemia protocol: Sliding scale insulin in place Mobility: Bedrest Code Status: Full Family Communication: Spoke with family, reviewed plans on 10/2   Labs   CBC: Recent Labs  Lab 07/13/18 1109  07/17/18 0637 07/18/18 0106 07/18/18 0235 07/19/18 0756 07/20/18 0546  WBC 10.8*   < > 8.8 5.1 6.5 9.1 7.6  NEUTROABS 7.5  --  5.9 3.4 4.5  --   --   HGB 13.8   < > 11.4* 8.5* 9.9* 11.0* 9.4*  HCT 44.7   < > 36.2 27.6* 32.0* 34.3* 30.1*  MCV 88.5   < > 87.0 89.9 87.7 86.6 88.3  PLT 324   < > 230 123* 172 181 163   < > = values in this interval not displayed.    Basic Metabolic Panel: Recent Labs  Lab 07/13/18 2353 07/14/18 0805 07/17/18 0973 07/18/18 0235 07/18/18 2146 07/19/18 0517 07/19/18 1645 07/20/18 0546  NA  --  141 141 142  --  141  --  138  K  --  3.6 3.5 2.7*  --  4.6  --  3.5  CL  --  111 107 115*  --  112*  --  109  CO2  --  21* 23 20*  --  18*  --  22  GLUCOSE  --  95 70 74  --  157*  --  184*  BUN  --  16 9 <5*  --  9  --  16  CREATININE  --  0.88 1.05* 0.76  --  0.85  --  0.65  CALCIUM  --  9.0 9.4 7.4*  --  8.7*  --  8.3*  MG 1.7  --   --  1.2* 2.4 2.1 2.0 1.8  PHOS 2.9  --   --   --  2.3* 2.0* 1.6* 1.7*   GFR: Estimated Creatinine Clearance: 54.7 mL/min (by C-G formula based on SCr of 0.65 mg/dL). Recent Labs  Lab 07/18/18 0106 07/18/18 0235 07/19/18 0756 07/20/18 0546  WBC 5.1 6.5 9.1 7.6    Liver Function Tests: Recent Labs  Lab 07/13/18 1109 07/17/18 0637 07/18/18 0235  AST 27 25 19   ALT 23 17 13   ALKPHOS 121 96 80  BILITOT 0.4  0.7 0.5  PROT 7.2 6.0* 5.1*  ALBUMIN 4.0 3.1* 2.6*   No results for input(s): LIPASE, AMYLASE in the last 168 hours. No results for input(s): AMMONIA in the last 168 hours.  ABG    Component Value Date/Time   PHART 7.345 (L) 07/18/2018 0452   PCO2ART 37.6 07/18/2018 0452   PO2ART 208.0 (H) 07/18/2018 0452   HCO3 20.6 07/18/2018 0452   TCO2 22 07/18/2018 0452   ACIDBASEDEF 5.0 (H) 07/18/2018 0452   O2SAT 100.0 07/18/2018 0452     Coagulation Profile: Recent Labs  Lab 07/13/18 1109 07/18/18 0106 07/19/18 0517  INR 1.00 1.83 1.41    Cardiac Enzymes: No results for input(s):  CKTOTAL, CKMB, CKMBINDEX, TROPONINI in the last 168 hours.  HbA1C: Hgb A1c MFr Bld  Date/Time Value Ref Range Status  07/13/2018 11:53 PM 6.3 (H) 4.8 - 5.6 % Final    Comment:    (NOTE) Pre diabetes:          5.7%-6.4% Diabetes:              >6.4% Glycemic control for   <7.0% adults with diabetes   06/02/2017 07:41 AM 9.5 (H) 4.8 - 5.6 % Final    Comment:    (NOTE)         Prediabetes: 5.7 - 6.4         Diabetes: >6.4         Glycemic control for adults with diabetes: <7.0     CBG: Recent Labs  Lab 07/19/18 1522 07/19/18 1940 07/19/18 2322 07/20/18 0323 07/20/18 0814  GLUCAP 153* 167* 159* 150* 166*    CC time: 30 min.   Montey Hora, [facility] Pager: [phone]  or [phone] 07/20/2018, 9:13 AM

## 2018-07-20 NOTE — Procedures (Signed)
Continuous Video-EEG Monitoring Report    Study Duration: 07/19/2018 07:30 to 07/20/2018 07:30 CPT Code:  75797 Diagnosis:  Altered mental status (R41.82); Seizures (R56.9)  History: This is a 67 year old female presenting with altered mental status and seizures in the setting of acute on chronic left MCA territory stroke.  Video-EEG monitoring was performed to evaluate for seizures.  Technical Details:  Long-term video-EEG monitoring was performed using standard setting per the guidelines.  Briefly, a minimum of 21 electrodes were placed on scalp according to the International 10-20 or/and 10-10 Systems.  Supplemental electrodes were placed as needed.  Single EKG electrode was also used to detect cardiac arrhythmia.  Patient's behavior was continuously recorded on video simultaneously with EEG.  A minimum of 16 channels were used for data display.  Each epoch of study was reviewed manually daily and as needed using standard referential and bipolar montages.    EEG Description:  There was generalized polymorphic delta slowing.  No posterior dominant rhythm or sleep architecture was recorded.  Diffuse beta activity was present, likely due to medication effect (such as benzodiazepine, propofol).  No epileptiform discharge or seizure was present.     Impression:  This is an abnormal EEG due to the presence of generalized polymorphic delta slowing, suggesting severe encephalopathy, but medication effect could not be excluded.  No epileptiform discharge or seizure was present.    [place] Physician: Winfield Cunas, MD, PhD

## 2018-07-21 ENCOUNTER — Inpatient Hospital Stay (HOSPITAL_COMMUNITY): Payer: Medicare Other

## 2018-07-21 LAB — CBC
HCT: 32.1 % — ABNORMAL LOW (ref 36.0–46.0)
HEMOGLOBIN: 9.9 g/dL — AB (ref 12.0–15.0)
MCH: 27.1 pg (ref 26.0–34.0)
MCHC: 30.8 g/dL (ref 30.0–36.0)
MCV: 87.9 fL (ref 78.0–100.0)
Platelets: 178 10*3/uL (ref 150–400)
RBC: 3.65 MIL/uL — AB (ref 3.87–5.11)
RDW: 16.1 % — ABNORMAL HIGH (ref 11.5–15.5)
WBC: 9.9 10*3/uL (ref 4.0–10.5)

## 2018-07-21 LAB — GLUCOSE, CAPILLARY
GLUCOSE-CAPILLARY: 156 mg/dL — AB (ref 70–99)
GLUCOSE-CAPILLARY: 111 mg/dL — AB (ref 70–99)
GLUCOSE-CAPILLARY: 142 mg/dL — AB (ref 70–99)
Glucose-Capillary: 115 mg/dL — ABNORMAL HIGH (ref 70–99)
Glucose-Capillary: 150 mg/dL — ABNORMAL HIGH (ref 70–99)
Glucose-Capillary: 182 mg/dL — ABNORMAL HIGH (ref 70–99)

## 2018-07-21 LAB — BASIC METABOLIC PANEL
ANION GAP: 7 (ref 5–15)
BUN: 10 mg/dL (ref 8–23)
CHLORIDE: 105 mmol/L (ref 98–111)
CO2: 24 mmol/L (ref 22–32)
Calcium: 8.5 mg/dL — ABNORMAL LOW (ref 8.9–10.3)
Creatinine, Ser: 0.66 mg/dL (ref 0.44–1.00)
GFR calc non Af Amer: >60 mL/min (ref >60)
Glucose, Bld: 145 mg/dL — ABNORMAL HIGH (ref 70–99)
Potassium: 3.7 mmol/L (ref 3.5–5.1)
SODIUM: 136 mmol/L (ref 135–145)

## 2018-07-21 LAB — MAGNESIUM: Magnesium: 1.8 mg/dL (ref 1.7–2.4)

## 2018-07-21 LAB — HEPARIN LEVEL (UNFRACTIONATED): Heparin Unfractionated: 0.54 IU/mL (ref 0.30–0.70)

## 2018-07-21 LAB — PHOSPHORUS: PHOSPHORUS: 2.7 mg/dL (ref 2.5–4.6)

## 2018-07-21 NOTE — Progress Notes (Signed)
ANTICOAGULATION CONSULT NOTE - Follow Up Consult  Pharmacy Consult for Heparin Indication: stroke  Patient Measurements: Height: 5\' 2"  (157.5 cm) Weight: 132 lb 7.9 oz (60.1 kg) IBW/kg (Calculated) : 50.1 Heparin Dosing Weight:  59 kg  Vital Signs: Temp: 99.2 F (37.3 C) (10/03 0400) Temp Source: Axillary (10/03 0400) BP: 135/101 (10/03 0700) Pulse Rate: 104 (10/03 0700)  Labs: Recent Labs    07/19/18 0517  07/19/18 0756  07/19/18 2013 07/20/18 0546 07/21/18 0554  HGB  --    < > 11.0*  --   --  9.4* 9.9*  HCT  --   --  34.3*  --   --  30.1* 32.1*  PLT  --   --  181  --   --  163 178  LABPROT 17.2*  --   --   --   --   --   --   INR 1.41  --   --   --   --   --   --   HEPARINUNFRC  --   --   --    < > 0.49 0.41 0.54  CREATININE 0.85  --   --   --   --  0.65 0.66   < > = values in this interval not displayed.    Estimated Creatinine Clearance: 54.7 mL/min (by C-G formula based on SCr of 0.66 mg/dL).  Assessment: 66 yof continues on IV heparin for carotid thrombus and CVA. Heparin level is slightly supratherapeutic. Hgb is stable and platelets are WNL. No bleeding noted.     Goal of Therapy:  Heparin level 0.3-0.5 units/ml Monitor platelets by anticoagulation protocol: Yes   Plan:  Reduce heparin to 500 units/hr Check heparin level and CBC daily  Salome Arnt, PharmD, BCPS Please see AMION for all pharmacy numbers 07/21/2018 7:33 AM

## 2018-07-21 NOTE — Progress Notes (Signed)
NAME:  [name], MRN:  [id number], DOB:  1950-12-10, LOS: 8 ADMISSION DATE:  07/13/2018, CONSULTATION DATE: 07/17/2018 REFERRING MD: Dr. Cheral Marker, Neurology, CHIEF COMPLAINT: Seizures  Brief History   67 year old woman, history of prior CVAs with carotid artery disease, hypertension, hyperlipidemia, normal pressure hydrocephalus status post shunt.  Admitted with seizures and a new left MCA territory stroke.  Treated with heparin and antiplatelets. Had recurrent seizures and transferred to ICU 9/29 for Versed and seizure suppression.  Intubated and ventilated 9/29.  Significant Hospital Events   9/25 Admit  9/29 To ICU for burst suppression   Consults: date of consult/date signed off & final recs:  Neurology 9/25  Procedures (surgical and bedside):  ETT 9/29 >>    Significant Diagnostic Tests:  CT head 9/25 >> evolutionary changes of multiple subcortical infarcts largest in the high left frontal lobe MRI brain 9/25 >> acute multifocal left frontoparietal MCA territory infarcts, old left greater than right MCA territory infarcts, chronic small vessel ischemic changes CT angio head and neck 9/26 >> left ICA 60% stenosis, severe stenosis of the proximal left A1 segment, mild narrowing distal portion right MCA M1 segment CT head 9/28 >> acute on chronic left MCA territory infarct without hemorrhagic conversion, old right MCA territory infarct LTM EEG 9/29 - 9/30 >> generalized burst of cortical myoclonus, focal motor seizure activity  Micro Data:    Antimicrobials:    Subjective:  EEG am 10/3.  RT reports pt weaning on 8/5, did not tolerate 5/5.    Objective   Blood pressure (!) 155/82, pulse (!) 102, temperature 99.1 F (37.3 C), temperature source Oral, resp. rate 19, height 5\' 2"  (1.575 m), weight 60.1 kg, SpO2 96 %.    Vent Mode: PSV;CPAP FiO2 (%):  [30 %] 30 % Set Rate:  [14 bmp] 14 bmp Vt Set:  [400 mL] 400 mL PEEP:  [5 cmH20] 5 cmH20 Pressure Support:  [8 cmH20] 8  cmH20 Plateau Pressure:  [10 cmH20-14 cmH20] 13 cmH20   Intake/Output Summary (Last 24 hours) at 07/21/2018 1044 Last data filed at 07/21/2018 0700 Gross per 24 hour  Intake 1791.55 ml  Output 800 ml  Net 991.55 ml   Filed Weights   07/19/18 0434 07/20/18 0500 07/21/18 0500  Weight: 60 kg 60.1 kg 60.1 kg    Examination: General: ill appearing adult female lying in bed on vent  HEENT: MM pink/moist, ETT, old trach scar, drooling out of right side mouth Neuro: flickers eyes to voice, spontaneous movement of left side, no follow commands CV: s1s2 rrr, no m/r/g PULM: even/non-labored, lungs bilaterally clear YI:AXKP, non-tender, bsx4 active  Extremities: warm/dry, trace LE edema, bruising to R foot from prior fall  Skin: no rashes or lesions  Resolved Hospital Problem list   Shock in the setting of sedation  Assessment & Plan:   Acute respiratory failure due to mental status, inability to protect airway P: PRVC 8cc/kg as rest mode PSV ventilation as tolerated  Follow intermittent CXR  Mental status prohibits extubation  ? If she will need re-trach  Acute frequent seizures, improved based on continuous EEG History normal pressure hydrocephalus, shunt P: Neurology following, appreciate input  Continue keppa, vimpat EEG pending 10/3   Acute left MCA stroke Chronic stroke, chronic left carotid disease P: Heparin per pharmacy  Timing of ASA per Neurology   Hypertension P: Labetalol PRN for SBP <160   Relative thrombocytopenia  P: Trend CBC  Monitor for bleeding while on anticoagulation  Hypophosphatemia P: Monitor, replace as indicated  Hyperlipidemia P: Gemfibrozil as ordered  Hyperglycemia P: SSI  Lantus 10 units QD    Disposition / Summary of Today's Plan 07/21/18   Continue PSV wean.  Adjust ETT.  Mental status prohibits extubation, ? If she will need re-trach / appropriateness.  EEG results pending.     Diet: NPO. Pain/Anxiety/Delirium  protocol (if indicated): Fentanyl as needed VAP protocol (if indicated): In place DVT prophylaxis: Heparin gtt GI prophylaxis: Pepcid   Hyperglycemia protocol: SSI Mobility: Bedrest Code Status: Full Family Communication: Daughter updated on plan of care 10/3   Labs   CBC: Recent Labs  Lab 07/17/18 0637 07/18/18 0106 07/18/18 0235 07/19/18 0756 07/20/18 0546 07/21/18 0554  WBC 8.8 5.1 6.5 9.1 7.6 9.9  NEUTROABS 5.9 3.4 4.5  --   --   --   HGB 11.4* 8.5* 9.9* 11.0* 9.4* 9.9*  HCT 36.2 27.6* 32.0* 34.3* 30.1* 32.1*  MCV 87.0 89.9 87.7 86.6 88.3 87.9  PLT 230 123* 172 181 163 546    Basic Metabolic Panel: Recent Labs  Lab 07/17/18 0637  07/18/18 0235 07/18/18 2146 07/19/18 0517 07/19/18 1645 07/20/18 0546 07/21/18 0554  NA 141  --  142  --  141  --  138 136  K 3.5  --  2.7*  --  4.6  --  3.5 3.7  CL 107  --  115*  --  112*  --  109 105  CO2 23  --  20*  --  18*  --  22 24  GLUCOSE 70  --  74  --  157*  --  184* 145*  BUN 9  --  <5*  --  9  --  16 10  CREATININE 1.05*  --  0.76  --  0.85  --  0.65 0.66  CALCIUM 9.4  --  7.4*  --  8.7*  --  8.3* 8.5*  MG  --    < > 1.2* 2.4 2.1 2.0 1.8 1.8  PHOS  --   --   --  2.3* 2.0* 1.6* 1.7* 2.7   < > = values in this interval not displayed.   GFR: Estimated Creatinine Clearance: 54.7 mL/min (by C-G formula based on SCr of 0.66 mg/dL). Recent Labs  Lab 07/18/18 0235 07/19/18 0756 07/20/18 0546 07/21/18 0554  WBC 6.5 9.1 7.6 9.9    Liver Function Tests: Recent Labs  Lab 07/17/18 0637 07/18/18 0235  AST 25 19  ALT 17 13  ALKPHOS 96 80  BILITOT 0.7 0.5  PROT 6.0* 5.1*  ALBUMIN 3.1* 2.6*   No results for input(s): LIPASE, AMYLASE in the last 168 hours. No results for input(s): AMMONIA in the last 168 hours.  ABG    Component Value Date/Time   PHART 7.345 (L) 07/18/2018 0452   PCO2ART 37.6 07/18/2018 0452   PO2ART 208.0 (H) 07/18/2018 0452   HCO3 20.6 07/18/2018 0452   TCO2 22 07/18/2018 0452    ACIDBASEDEF 5.0 (H) 07/18/2018 0452   O2SAT 100.0 07/18/2018 0452     Coagulation Profile: Recent Labs  Lab 07/18/18 0106 07/19/18 0517  INR 1.83 1.41    Cardiac Enzymes: No results for input(s): CKTOTAL, CKMB, CKMBINDEX, TROPONINI in the last 168 hours.  HbA1C: Hgb A1c MFr Bld  Date/Time Value Ref Range Status  07/13/2018 11:53 PM 6.3 (H) 4.8 - 5.6 % Final    Comment:    (NOTE) Pre diabetes:  5.7%-6.4% Diabetes:              >6.4% Glycemic control for   <7.0% adults with diabetes   06/02/2017 07:41 AM 9.5 (H) 4.8 - 5.6 % Final    Comment:    (NOTE)         Prediabetes: 5.7 - 6.4         Diabetes: >6.4         Glycemic control for adults with diabetes: <7.0     CBG: Recent Labs  Lab 07/20/18 1554 07/20/18 1922 07/20/18 2348 07/21/18 0341 07/21/18 0750  GLUCAP 162* 171* [place], NP-C [facility] Pgr: [phone] or if no answer [phone] 07/21/2018, 10:44 AM

## 2018-07-21 NOTE — Progress Notes (Signed)
Spoke with NP Brandi in regards to BP high 540G-867Y systolic, despite PRN labetalol. Waiting for new orders to be placed.

## 2018-07-21 NOTE — Procedures (Signed)
History: 67 year old female status post multiple strokes who developed focal status epilepticus.  Sedation: None  Technique: This is a 21 channel routine scalp EEG performed at the bedside with bipolar and monopolar montages arranged in accordance to the international 10/20 system of electrode placement. One channel was dedicated to EKG recording.    Background: There is generalized irregular delta and theta activities.  In addition there are occasional K complexes consistent with sleep.  The right arm twitching type movements were observed with no cortical discharges associated with these movements.  Photic stimulation: Physiologic driving is not performed  EEG Abnormalities: 1) generalized irregular slow activity  Clinical Interpretation: This EEG is consistent with a generalized nonspecific cerebral dysfunction (encephalopathy). There was no seizure or seizure predisposition recorded on this study. Please note that a normal EEG does not preclude the possibility of epilepsy.   Roland Rack, MD [facility] [phone]  If 7pm- 7am, please page neurology on call as listed in [place].

## 2018-07-21 NOTE — Progress Notes (Signed)
Bedside EEG completed; results pending. 

## 2018-07-21 NOTE — Progress Notes (Signed)
Subjective: Some twitching of the right arm, not rhythmic.   Exam: Vitals:   07/21/18 0600 07/21/18 0700  BP: (!) 143/64 (!) 135/101  Pulse: 97 (!) 104  Resp: 16 (!) 21  Temp:    SpO2: 98% 99%   Gen: In bed, intubated Resp: Ventilated Abd: soft, nt  Neuro: MS: Opens eyes partialy to noxious stimulation, does not follow commands, but engages examiner by looking to him on the right side today.  CN: Pupils equal round reactive, eyes are conjugate, she does look to the right today, do not clearly see her look to left.  Motor: withdraws to noxious stimulation, moves left more than right, but moves both sides.  DTR: brisk throughout right side.  Sensory: As above  Pertinent Labs: Cr 0.65  Impression: 67 year old female with frequent clinical seizures following recent infarcts.  Due to the frequent nature and her failure to respond to initial antiepileptics, she was started on high-dose Versed with cessation of these discharges.  She has not had any further episodes concerning for seizure since that was started.  She is taking Mariann Laster wake up, but given the dose of Versed that she received, it is still possible that this is simply sedation effect.  Recommendations: 1) continue Keppra 1 g twice daily 2) continue vimpat 200mg  q12h 3) will follow.   Roland Rack, MD [facility] [phone]  If 7pm- 7am, please page neurology on call as listed in [place]. 07/21/2018  8:03 AM

## 2018-07-22 ENCOUNTER — Inpatient Hospital Stay (HOSPITAL_COMMUNITY): Payer: Medicare Other

## 2018-07-22 LAB — CBC
HCT: 30.9 % — ABNORMAL LOW (ref 36.0–46.0)
Hemoglobin: 9.5 g/dL — ABNORMAL LOW (ref 12.0–15.0)
MCH: 27.7 pg (ref 26.0–34.0)
MCHC: 30.7 g/dL (ref 30.0–36.0)
MCV: 90.1 fL (ref 78.0–100.0)
PLATELETS: 194 10*3/uL (ref 150–400)
RBC: 3.43 MIL/uL — ABNORMAL LOW (ref 3.87–5.11)
RDW: 16.1 % — AB (ref 11.5–15.5)
WBC: 8.5 10*3/uL (ref 4.0–10.5)

## 2018-07-22 LAB — BASIC METABOLIC PANEL
ANION GAP: 13 (ref 5–15)
BUN: 9 mg/dL (ref 8–23)
CALCIUM: 8.4 mg/dL — AB (ref 8.9–10.3)
CO2: 21 mmol/L — ABNORMAL LOW (ref 22–32)
Chloride: 104 mmol/L (ref 98–111)
Creatinine, Ser: 0.7 mg/dL (ref 0.44–1.00)
GLUCOSE: 193 mg/dL — AB (ref 70–99)
Potassium: 4.3 mmol/L (ref 3.5–5.1)
Sodium: 138 mmol/L (ref 135–145)

## 2018-07-22 LAB — HEPARIN LEVEL (UNFRACTIONATED)
HEPARIN UNFRACTIONATED: 0.23 [IU]/mL — AB (ref 0.30–0.70)
HEPARIN UNFRACTIONATED: 0.24 [IU]/mL — AB (ref 0.30–0.70)
HEPARIN UNFRACTIONATED: 0.35 [IU]/mL (ref 0.30–0.70)

## 2018-07-22 LAB — GLUCOSE, CAPILLARY
GLUCOSE-CAPILLARY: 176 mg/dL — AB (ref 70–99)
GLUCOSE-CAPILLARY: 109 mg/dL — AB (ref 70–99)
GLUCOSE-CAPILLARY: 168 mg/dL — AB (ref 70–99)
Glucose-Capillary: 174 mg/dL — ABNORMAL HIGH (ref 70–99)

## 2018-07-22 MED ORDER — INFLUENZA VAC SPLIT HIGH-DOSE 0.5 ML IM SUSY
0.5000 mL | PREFILLED_SYRINGE | INTRAMUSCULAR | Status: DC
  Filled 2018-07-22: qty 0.5

## 2018-07-22 MED ORDER — METOPROLOL TARTRATE 25 MG/10 ML ORAL SUSPENSION
25.0000 mg | Freq: Two times a day (BID) | ORAL | Status: DC
  Administered 2018-07-22 – 2018-07-23 (×4): 25 mg
  Filled 2018-07-22 (×4): qty 10

## 2018-07-22 MED ORDER — GABAPENTIN 250 MG/5ML PO SOLN
300.0000 mg | Freq: Three times a day (TID) | ORAL | Status: DC
  Administered 2018-07-22 – 2018-07-23 (×6): 300 mg
  Filled 2018-07-22 (×7): qty 6

## 2018-07-22 MED ORDER — GABAPENTIN 300 MG PO CAPS
300.0000 mg | ORAL_CAPSULE | Freq: Three times a day (TID) | ORAL | Status: DC
  Filled 2018-07-22: qty 1

## 2018-07-22 MED ORDER — GABAPENTIN 300 MG PO CAPS: 300.0000 mg | ORAL_CAPSULE | Freq: Three times a day (TID) | ORAL | Status: DC

## 2018-07-22 NOTE — Progress Notes (Signed)
ANTICOAGULATION CONSULT NOTE - Follow Up Consult  Pharmacy Consult for Heparin Indication: stroke  Patient Measurements: Height: 5\' 2"  (157.5 cm) Weight: 132 lb 7.9 oz (60.1 kg) IBW/kg (Calculated) : 50.1 Heparin Dosing Weight:  59 kg  Vital Signs: Temp: 98.6 F (37 C) (10/04 0400) Temp Source: Axillary (10/04 0400) BP: 159/87 (10/03 2300) Pulse Rate: 101 (10/04 0408)  Labs: Recent Labs    07/20/18 0546 07/21/18 0554 07/22/18 0348  HGB 9.4* 9.9* 9.5*  HCT 30.1* 32.1* 30.9*  PLT 163 178 194  HEPARINUNFRC 0.41 0.54 0.23*  CREATININE 0.65 0.66  --     Estimated Creatinine Clearance: 54.7 mL/min (by C-G formula based on SCr of 0.66 mg/dL).  Assessment: 28 yof continues on IV heparin for carotid thrombus and CVA. Heparin level now down to subtherapeutic (0.23) on gtt at 500 units/hr. Hgb is stable and platelets are WNL. No bleeding noted.     Goal of Therapy:  Heparin level 0.3-0.5 units/ml Monitor platelets by anticoagulation protocol: Yes   Plan:  Increase heparin back to 550 units/hr Will f/u 6 hr heparin level  Sherlon Handing, PharmD, BCPS Clinical pharmacist  **Pharmacist phone directory can now be found on [email or web address] ([email or web address]).  Listed under [place]. 07/22/2018 5:17 AM

## 2018-07-22 NOTE — Progress Notes (Signed)
Subjective: Contineus to have twitching of the righ tarm, more as she wakes up more.   Exam: Vitals:   07/22/18 0835 07/22/18 0838  BP:    Pulse: 89   Resp: 17   Temp:  98.7 F (37.1 C)  SpO2: 100% 100%   Gen: In bed, intubated Resp: Ventilated Abd: soft, nt  Neuro: MS: Opens eyes and engages examiner, does not clearly follow commands.  CN: Pupils equal round reactive, eyes are conjugate, she does look to the right today, do not clearly see her look to left.  Motor: withdraws to noxious stimulation, moves left more than right, but moves both sides.  DTR: brisk throughout right side.  Sensory: As above  Pertinent Labs: Cr 0.65  Impression: 67 year old female with frequent clinical seizures following recent infarcts.  Due to the frequent nature and her failure to respond to initial antiepileptics, she was started on high-dose Versed with cessation of these discharges.  She has not had any further episodes concerning for seizure since that was started.  She is gradually waking up. I do not think the right arm movements are ictal, and EEG yesterday did not demonstrate cortical discharges with the movements. I think it is subcortical myoclonus. Benzos can me useful for this, but will try gabapentin as this would hopefully be less sedation.   Recommendations: 1) continue Keppra 1 g twice daily 2) continue vimpat 200mg  q12h 3) will use gabapentin to see if this helps with myoclonus, but if it causes sedation, will stop it. Roland Rack, MD [facility] [phone]  If 7pm- 7am, please page neurology on call as listed in [place]. 07/22/2018  9:17 AM

## 2018-07-22 NOTE — Progress Notes (Addendum)
NAME:  [name], MRN:  [id number], DOB:  04/20/1951, LOS: 9 ADMISSION DATE:  07/13/2018, CONSULTATION DATE: 07/17/2018 REFERRING MD: Dr. Cheral Marker, Neurology, CHIEF COMPLAINT: Seizures  Brief History   67 year old woman, history of prior CVAs with carotid artery disease, hypertension, hyperlipidemia, normal pressure hydrocephalus status post shunt.  Admitted with seizures and a new left MCA territory stroke.  Treated with heparin and antiplatelets. Had recurrent seizures and transferred to ICU 9/29 for Versed and seizure suppression.  Intubated and ventilated 9/29.  Significant Hospital Events   9/25 Admit  9/29 To ICU for burst suppression   Consults: date of consult/date signed off & final recs:  Neurology 9/25  Procedures (surgical and bedside):  ETT 9/29 >>    Significant Diagnostic Tests:  CT head 9/25 >> evolutionary changes of multiple subcortical infarcts largest in the high left frontal lobe MRI brain 9/25 >> acute multifocal left frontoparietal MCA territory infarcts, old left greater than right MCA territory infarcts, chronic small vessel ischemic changes CT angio head and neck 9/26 >> left ICA 60% stenosis, severe stenosis of the proximal left A1 segment, mild narrowing distal portion right MCA M1 segment CT head 9/28 >> acute on chronic left MCA territory infarct without hemorrhagic conversion, old right MCA territory infarct LTM EEG 9/29 - 9/30 >> generalized burst of cortical myoclonus, focal motor seizure activity EEG 10/3 >> consistent with generalized nonspecific cerebral dysfunction (encephalopathy). No seizure or seizure predisposition. MRI Brain 10/4 >>   Micro Data:    Antimicrobials:    Subjective:  Tmax 100.5.  RN reports hypertension overnight.  Pt given fentanyl around 0400 for agitation.  Weaning on PSV but drowsy.   Objective   Blood pressure (!) 172/96, pulse 89, temperature 98.7 F (37.1 C), temperature source Axillary, resp. rate 17, height  5\' 2"  (1.575 m), weight 60.1 kg, SpO2 100 %.    Vent Mode: PSV;CPAP FiO2 (%):  [30 %] 30 % Set Rate:  [14 bmp] 14 bmp Vt Set:  [400 mL] 400 mL PEEP:  [5 cmH20] 5 cmH20 Pressure Support:  [5 cmH20-8 cmH20] 5 cmH20 Plateau Pressure:  [11 cmH20-14 cmH20] 14 cmH20   Intake/Output Summary (Last 24 hours) at 07/22/2018 0934 Last data filed at 07/22/2018 0700 Gross per 24 hour  Intake 1219.25 ml  Output 1450 ml  Net -230.75 ml   Filed Weights   07/19/18 0434 07/20/18 0500 07/21/18 0500  Weight: 60 kg 60.1 kg 60.1 kg    Examination: General: adult female, appears older than age, lying in bed on vent HEENT: MM pink/moist, ETT Neuro: opens eyes to voice, no follow commands, moves all extremities spontaneously  CV: s1s2 rrr, no m/r/g PULM: even/non-labored, lungs bilaterally clear  WU:JWJX, non-tender, bsx4 active  Extremities: warm/dry, no edema, bruising to right foot from prior fall  Skin: no rashes or lesions  Resolved Hospital Problem list   Shock in the setting of sedation  Assessment & Plan:   Acute respiratory failure due to mental status, inability to protect airway P: PRVC as rest mode  PSV as tolerated  Await improvement of mental status for extubation  Follow intermittent CXR   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 History normal pressure hydrocephalus, shunt P: Neurology  Appreciate Neurology  Continue Keppra, Vimpat  Follow neuro exam  Minimize sedation as able   Acute left MCA stroke Chronic stroke, chronic left carotid disease Carotid Thrombus  P: Heparin per pharmacy  Timing of ASA per Neurology MRI brain 10/4  Hypertension P: Resume home lopressor at reduced dose, 25 mg BID  PRN labetalol for SBP < 160  Relative thrombocytopenia - resolved P: Trend CBC Monitor for bleeding while on anticoagulation   Hypophosphatemia P: Monitor, replace as indicated   Hyperlipidemia P: Gemfibrozil    Hyperglycemia P: SSI  Lantus  10 units QD   Disposition / Summary of Today's Plan 07/22/18   Continue PSV as tolerated. Minimize sedating medications to allow for WUA / SBT.      Diet: NPO. Pain/Anxiety/Delirium protocol (if indicated): Fentanyl PRN DVT prophylaxis: Heparin gtt GI prophylaxis: Pepcid   Hyperglycemia protocol: SSI Mobility: Bedrest Code Status: Full Family Communication: No family available am 10/4.    Labs   CBC: Recent Labs  Lab 07/17/18 0637 07/18/18 0106 07/18/18 0235 07/19/18 0756 07/20/18 0546 07/21/18 0554 07/22/18 0348  WBC 8.8 5.1 6.5 9.1 7.6 9.9 8.5  NEUTROABS 5.9 3.4 4.5  --   --   --   --   HGB 11.4* 8.5* 9.9* 11.0* 9.4* 9.9* 9.5*  HCT 36.2 27.6* 32.0* 34.3* 30.1* 32.1* 30.9*  MCV 87.0 89.9 87.7 86.6 88.3 87.9 90.1  PLT 230 123* 172 181 163 178 161    Basic Metabolic Panel: Recent Labs  Lab 07/18/18 0235 07/18/18 2146 07/19/18 0517 07/19/18 1645 07/20/18 0546 07/21/18 0554 07/22/18 0348  NA 142  --  141  --  138 136 138  K 2.7*  --  4.6  --  3.5 3.7 4.3  CL 115*  --  112*  --  109 105 104  CO2 20*  --  18*  --  22 24 21*  GLUCOSE 74  --  157*  --  184* 145* 193*  BUN <5*  --  9  --  16 10 9   CREATININE 0.76  --  0.85  --  0.65 0.66 0.70  CALCIUM 7.4*  --  8.7*  --  8.3* 8.5* 8.4*  MG 1.2* 2.4 2.1 2.0 1.8 1.8  --   PHOS  --  2.3* 2.0* 1.6* 1.7* 2.7  --    GFR: Estimated Creatinine Clearance: 54.7 mL/min (by C-G formula based on SCr of 0.7 mg/dL). Recent Labs  Lab 07/19/18 0756 07/20/18 0546 07/21/18 0554 07/22/18 0348  WBC 9.1 7.6 9.9 8.5    Liver Function Tests: Recent Labs  Lab 07/17/18 0637 07/18/18 0235  AST 25 19  ALT 17 13  ALKPHOS 96 80  BILITOT 0.7 0.5  PROT 6.0* 5.1*  ALBUMIN 3.1* 2.6*   No results for input(s): LIPASE, AMYLASE in the last 168 hours. No results for input(s): AMMONIA in the last 168 hours.  ABG    Component Value Date/Time   PHART 7.345 (L) 07/18/2018 0452   PCO2ART 37.6 07/18/2018 0452   PO2ART 208.0 (H)  07/18/2018 0452   HCO3 20.6 07/18/2018 0452   TCO2 22 07/18/2018 0452   ACIDBASEDEF 5.0 (H) 07/18/2018 0452   O2SAT 100.0 07/18/2018 0452     Coagulation Profile: Recent Labs  Lab 07/18/18 0106 07/19/18 0517  INR 1.83 1.41    Cardiac Enzymes: No results for input(s): CKTOTAL, CKMB, CKMBINDEX, TROPONINI in the last 168 hours.  HbA1C: Hgb A1c MFr Bld  Date/Time Value Ref Range Status  07/13/2018 11:53 PM 6.3 (H) 4.8 - 5.6 % Final    Comment:    (NOTE) Pre diabetes:          5.7%-6.4% Diabetes:              >  6.4% Glycemic control for   <7.0% adults with diabetes   06/02/2017 07:41 AM 9.5 (H) 4.8 - 5.6 % Final    Comment:    (NOTE)         Prediabetes: 5.7 - 6.4         Diabetes: >6.4         Glycemic control for adults with diabetes: <7.0     CBG: Recent Labs  Lab 07/21/18 1640 07/21/18 1932 07/21/18 2346 07/22/18 0358 07/22/18 0819  GLUCAP 182* 142* 150* 174* 176*    Noe Gens, NP-C [facility] Pgr: [phone] or if no answer [phone] 07/22/2018, 9:34 AM

## 2018-07-22 NOTE — Progress Notes (Signed)
ANTICOAGULATION CONSULT NOTE - Follow Up Consult  Pharmacy Consult for Heparin Indication: stroke  Patient Measurements: Height: 5\' 2"  (157.5 cm) Weight: 132 lb 7.9 oz (60.1 kg) IBW/kg (Calculated) : 50.1 Heparin Dosing Weight:  59 kg  Vital Signs: Temp: 97.9 F (36.6 C) (10/04 1147) Temp Source: Axillary (10/04 1147) BP: 157/74 (10/04 1200) Pulse Rate: 78 (10/04 1200)  Labs: Recent Labs    07/20/18 0546 07/21/18 0554 07/22/18 0348 07/22/18 1104  HGB 9.4* 9.9* 9.5*  --   HCT 30.1* 32.1* 30.9*  --   PLT 163 178 194  --   HEPARINUNFRC 0.41 0.54 0.23* 0.24*  CREATININE 0.65 0.66 0.70  --     Estimated Creatinine Clearance: 54.7 mL/min (by C-G formula based on SCr of 0.7 mg/dL).  Assessment: 2 yof continues on IV heparin for carotid thrombus and CVA. Heparin level remains slightly sub therapeutic (0.24) on gtt at 550 units/hr. Hgb is stable and platelets are WNL. No bleeding noted.     Goal of Therapy:  Heparin level 0.3-0.5 units/ml Monitor platelets by anticoagulation protocol: Yes   Plan:  Increase heparin to 600 units/hr Check an 8 hr heparin level Daily heparin level and CBC  Salome Arnt, PharmD, BCPS Please see AMION for all pharmacy numbers 07/22/2018 12:36 PM

## 2018-07-22 NOTE — Progress Notes (Signed)
ANTICOAGULATION CONSULT NOTE - Follow Up Consult  Pharmacy Consult for Heparin Indication: stroke  Patient Measurements: Height: 5\' 2"  (157.5 cm) Weight: 132 lb 7.9 oz (60.1 kg) IBW/kg (Calculated) : 50.1 Heparin Dosing Weight:  59 kg  Vital Signs: Temp: 99.8 F (37.7 C) (10/04 2000) Temp Source: Axillary (10/04 2000) BP: 149/69 (10/04 1900) Pulse Rate: 106 (10/04 2005)  Labs: Recent Labs    07/20/18 0546 07/21/18 0554 07/22/18 0348 07/22/18 1104 07/22/18 2125  HGB 9.4* 9.9* 9.5*  --   --   HCT 30.1* 32.1* 30.9*  --   --   PLT 163 178 194  --   --   HEPARINUNFRC 0.41 0.54 0.23* 0.24* 0.35  CREATININE 0.65 0.66 0.70  --   --     Estimated Creatinine Clearance: 54.7 mL/min (by C-G formula based on SCr of 0.7 mg/dL).  Assessment: 56 yof continues on IV heparin for carotid thrombus and CVA. Heparin level within therapeutic range at 0.35 on drip at 600 units/hr. Hgb is stable and platelets are WNL. No bleeding noted.     Goal of Therapy:  Heparin level 0.3-0.5 units/ml Monitor platelets by anticoagulation protocol: Yes   Plan:  Continue heparin drip at 600 units/hr Check heparin level with am labs Daily heparin level and CBC   Alanda Slim, PharmD, [facility] Clinical Pharmacist Please see AMION for all Pharmacists' Contact Phone Numbers 07/22/2018, 10:17 PM

## 2018-07-22 NOTE — Progress Notes (Signed)
PT Cancellation Note  Patient Details Name: [name] MRN: [id number] DOB: 29-Aug-1951   Cancelled Treatment:    Reason Eval/Treat Not Completed: Patient not medically ready;Medical issues which prohibited therapy.  Held by RN, vented, not following any commands. 07/22/2018  [name], PT Acute Rehabilitation Services [phone]  (pager) [phone]  (office)   [name] 07/22/2018, 1:08 PM

## 2018-07-23 ENCOUNTER — Encounter (HOSPITAL_COMMUNITY): Payer: Self-pay | Admitting: Radiology

## 2018-07-23 ENCOUNTER — Inpatient Hospital Stay (HOSPITAL_COMMUNITY): Payer: Medicare Other

## 2018-07-23 DIAGNOSIS — J9601 Acute respiratory failure with hypoxia: Secondary | ICD-10-CM

## 2018-07-23 DIAGNOSIS — G9340 Encephalopathy, unspecified: Secondary | ICD-10-CM

## 2018-07-23 LAB — BASIC METABOLIC PANEL
ANION GAP: 11 (ref 5–15)
BUN: 10 mg/dL (ref 8–23)
CHLORIDE: 106 mmol/L (ref 98–111)
CO2: 24 mmol/L (ref 22–32)
CREATININE: 0.68 mg/dL (ref 0.44–1.00)
Calcium: 8.9 mg/dL (ref 8.9–10.3)
GFR calc non Af Amer: >60 mL/min (ref >60)
Glucose, Bld: 163 mg/dL — ABNORMAL HIGH (ref 70–99)
Potassium: 3.8 mmol/L (ref 3.5–5.1)
SODIUM: 141 mmol/L (ref 135–145)

## 2018-07-23 LAB — GLUCOSE, CAPILLARY
GLUCOSE-CAPILLARY: 198 mg/dL — AB (ref 70–99)
GLUCOSE-CAPILLARY: 135 mg/dL — AB (ref 70–99)
GLUCOSE-CAPILLARY: 139 mg/dL — AB (ref 70–99)
Glucose-Capillary: 138 mg/dL — ABNORMAL HIGH (ref 70–99)
Glucose-Capillary: 172 mg/dL — ABNORMAL HIGH (ref 70–99)
Glucose-Capillary: 185 mg/dL — ABNORMAL HIGH (ref 70–99)
Glucose-Capillary: 96 mg/dL (ref 70–99)

## 2018-07-23 LAB — CBC
HCT: 29.4 % — ABNORMAL LOW (ref 36.0–46.0)
HEMOGLOBIN: 9.1 g/dL — AB (ref 12.0–15.0)
MCH: 27.9 pg (ref 26.0–34.0)
MCHC: 31 g/dL (ref 30.0–36.0)
MCV: 90.2 fL (ref 78.0–100.0)
PLATELETS: 206 10*3/uL (ref 150–400)
RBC: 3.26 MIL/uL — ABNORMAL LOW (ref 3.87–5.11)
RDW: 15.9 % — AB (ref 11.5–15.5)
WBC: 7.5 10*3/uL (ref 4.0–10.5)

## 2018-07-23 LAB — HEPARIN LEVEL (UNFRACTIONATED): HEPARIN UNFRACTIONATED: 0.32 [IU]/mL (ref 0.30–0.70)

## 2018-07-23 NOTE — Progress Notes (Signed)
Subjective: More awake today, continues to have some twitching of her right arm  Exam: Vitals:   07/23/18 1700 07/23/18 1730  BP: 104/88 (!) 153/90  Pulse: 82 90  Resp: 14 14  Temp:    SpO2: 98% 100%   Gen: In bed, intubated Resp: Ventilated Abd: soft, nt  Neuro: MS: Opens eyes and engages examiner, she does follow commands to wiggle toes today CN: Pupils equal round reactive, eyes are conjugate, she does look to the right today, do not clearly see her look to left.  Motor: withdraws to noxious stimulation, moves left more than right, but moves both sides.  DTR: brisk throughout right side.  Sensory: As above  Pertinent Labs: Cr 0.65  Impression: 67 year old female with frequent clinical seizures following recent infarcts.  Due to the frequent nature and her failure to respond to initial antiepileptics, she was started on high-dose Versed with cessation of these discharges.  She has not had any further episodes concerning for seizure since that was started.  She is gradually waking up. I do not think the right arm movements are ictal, and EEG yesterday did not demonstrate cortical discharges with the movements. I think it is subcortical myoclonus. Benzos can me useful for this, but will try gabapentin as this would hopefully be less sedation.   Recommendations: 1) continue Keppra 1 g twice daily 2) continue vimpat 200mg  q12h 3) continue gabapentin 300 3 times daily  Roland Rack, MD [facility] [phone]  If 7pm- 7am, please page neurology on call as listed in [place]. 07/23/2018  6:54 PM

## 2018-07-23 NOTE — Progress Notes (Signed)
MD notified that urine malodorous. No other s/s infection. Cont to monitor.

## 2018-07-23 NOTE — Progress Notes (Signed)
PT Cancellation Note  Patient Details Name: CAROLEE CHANNELL MRN: [id number] DOB: 11-Sep-1951   Cancelled Treatment:    Reason Eval/Treat Not Completed: Medical issues which prohibited therapy.  I spoke with RN and we discussed the pros and cons of assessing pt today.  RN reporting that pt is weaning, but with any mobility she starts to cough on the vent and has to be given strong sedative to stop.  As she is weaning and hopeful to not sedate her to continue weaning, she recommended not mobilizing her today.  PT to check back another date.  Thanks,    [name]. Kharee Lesesne, PT, DPT  Acute Rehabilitation [phone] pager #[phone] office   07/23/2018, 3:38 PM

## 2018-07-23 NOTE — Progress Notes (Signed)
ANTICOAGULATION CONSULT NOTE - Follow Up Consult  Pharmacy Consult for Heparin Indication: stroke  Patient Measurements: Height: 5\' 2"  (157.5 cm) Weight: 132 lb 7.9 oz (60.1 kg) IBW/kg (Calculated) : 50.1 Heparin Dosing Weight:  59 kg  Vital Signs: Temp: 99.1 F (37.3 C) (10/05 0400) Temp Source: Axillary (10/05 0400) BP: 178/80 (10/05 0400) Pulse Rate: 96 (10/05 0400)  Labs: Recent Labs    07/21/18 0554 07/22/18 0348 07/22/18 1104 07/22/18 2125 07/23/18 0444  HGB 9.9* 9.5*  --   --  9.1*  HCT 32.1* 30.9*  --   --  29.4*  PLT 178 194  --   --  206  HEPARINUNFRC 0.54 0.23* 0.24* 0.35 0.32  CREATININE 0.66 0.70  --   --  0.68    Estimated Creatinine Clearance: 54.7 mL/min (by C-G formula based on SCr of 0.68 mg/dL).  Assessment: 40 yof continues on IV heparin for carotid thrombus and CVA. Heparin level within therapeutic range at 0.32 on drip at 600 units/hr. Hgb is low but stable and platelets are WNL. No bleeding noted.     Goal of Therapy:  Heparin level 0.3-0.5 units/ml Monitor platelets by anticoagulation protocol: Yes   Plan:  Continue heparin drip at 600 units/hr Daily heparin level and CBC  Excell Seltzer PharmD Clinical Pharmacist  07/23/2018, 5:51 AM

## 2018-07-23 NOTE — Progress Notes (Signed)
RT NOTE: RT transported patient on ventilator from room 4N24 to MRI and back to room 9T66 with no complications. Patient was placed on full support with 100% FIO2 for transport and MRI. Patient is now back weaning on PS of 8 and PEEP of 5. Vitals are stable. RT will continue to monitor.

## 2018-07-23 NOTE — Progress Notes (Addendum)
NAME:  [name], MRN:  [id number], DOB:  [birth year], LOS: 65 ADMISSION DATE:  07/13/2018, CONSULTATION DATE: 07/17/2018 REFERRING MD: Dr. Cheral Marker, Neurology, CHIEF COMPLAINT: Seizures  Brief History   67 year old woman, history of prior CVAs with carotid artery disease, hypertension, hyperlipidemia, normal pressure hydrocephalus status post shunt.  Admitted with seizures and a new left MCA territory stroke.  Treated with heparin and antiplatelets. Had recurrent seizures and transferred to ICU 9/29 for Versed and seizure suppression.  Intubated and ventilated 9/29.  Significant Hospital Events   9/25 Admit  9/29 To ICU for burst suppression   Consults: date of consult/date signed off & final recs:  Neurology 9/25  Procedures (surgical and bedside):  ETT 9/29 >>    Significant Diagnostic Tests:  CT head 9/25 >> evolutionary changes of multiple subcortical infarcts largest in the high left frontal lobe MRI brain 9/25 >> acute multifocal left frontoparietal MCA territory infarcts, old left greater than right MCA territory infarcts, chronic small vessel ischemic changes CT angio head and neck 9/26 >> left ICA 60% stenosis, severe stenosis of the proximal left A1 segment, mild narrowing distal portion right MCA M1 segment CT head 9/28 >> acute on chronic left MCA territory infarct without hemorrhagic conversion, old right MCA territory infarct LTM EEG 9/29 - 9/30 >> generalized burst of cortical myoclonus, focal motor seizure activity EEG 10/3 >> consistent with generalized nonspecific cerebral dysfunction (encephalopathy). No seizure or seizure predisposition. MRI Brain 10/4 >>   Micro Data:    Antimicrobials:    Subjective:  Remains drowsy but follows commands  Objective   Blood pressure (!) 152/77, pulse 85, temperature 97.9 F (36.6 C), temperature source Axillary, resp. rate 16, height 5\' 2"  (1.575 m), weight 60.1 kg, SpO2 100 %.    Vent Mode: PSV;CPAP FiO2 (%):  [30  %] 30 % Set Rate:  [14 bmp] 14 bmp Vt Set:  [400 mL] 400 mL PEEP:  [5 cmH20] 5 cmH20 Pressure Support:  [8 cmH20] 8 cmH20 Plateau Pressure:  [10 cmH20-12 cmH20] 10 cmH20   Intake/Output Summary (Last 24 hours) at 07/23/2018 1009 Last data filed at 07/23/2018 0600 Gross per 24 hour  Intake 1276.07 ml  Output 1240 ml  Net 36.07 ml   Filed Weights   07/19/18 0434 07/20/18 0500 07/21/18 0500  Weight: 60 kg 60.1 kg 60.1 kg    Examination: General: 67 year old female who looks older than stated age 29: Tracheal tube in place Neuro: Follows some commands.  Moves all extremities.  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 History normal pressure hydrocephalus, shunt P: Neurology  Neurology following appreciate their input Continue antiseizure medication Remains extremely sleepy from high dose sedatives for seizure suppression  Acute left MCA stroke Chronic stroke, chronic left carotid disease Carotid Thrombus  P: Heparin per pharmacy ASA timing per neurology Further radiological examination per neurology  Hypertension P: Back on home medication   Relative thrombocytopenia - resolved P: Trend CBC and platelet Monitor for bleeding issues  Hypophosphatemia P: Monitor and replace as needed  Hyperlipidemia  P:  New current treatment  Hyperglycemia CBG (last 3)  Recent Labs    07/22/18 1933 07/22/18 2315 07/23/18 0345  GLUCAP 135* 168* 139*    P:  Scale insulin 2 new Lantus with good control     Disposition / Summary of Today's Plan 07/23/18   Continue to wean as tolerated Much to sleep edema consider extubation Patient is a tracheostomy in the near future    Diet: NPO. Pain/Anxiety/Delirium protocol (if indicated): Fentanyl PRN DVT prophylaxis: Heparin gtt GI prophylaxis: Pepcid   Hyperglycemia protocol: SSI Mobility: Bedrest Code Status: Full Family Communication: No family available am 10/4.    Labs   CBC: Recent Labs  Lab 07/17/18 0637 07/18/18 0106 07/18/18 0235 07/19/18 0756 07/20/18 0546 07/21/18 0554 07/22/18 0348 07/23/18 0444  WBC 8.8 5.1 6.5 9.1 7.6 9.9 8.5 7.5  NEUTROABS 5.9 3.4 4.5  --   --   --   --   --   HGB 11.4* 8.5* 9.9* 11.0* 9.4* 9.9* 9.5* 9.1*  HCT 36.2 27.6* 32.0* 34.3* 30.1* 32.1* 30.9* 29.4*  MCV 87.0 89.9 87.7 86.6 88.3 87.9 90.1 90.2  PLT 230 123* 172 181 163 178 194 235    Basic Metabolic Panel: Recent Labs  Lab 07/18/18 2146 07/19/18 0517 07/19/18 1645 07/20/18 0546 07/21/18 0554 07/22/18 0348 07/23/18 0444  NA  --  141  --  138 136 138 141  K  --  4.6  --  3.5 3.7 4.3 3.8  CL  --  112*  --  109 105 104 106  CO2  --  18*  --  22 24 21* 24  GLUCOSE  --  157*  --  184* 145* 193* 163*  BUN  --  9  --  16 10 9 10   CREATININE  --  0.85  --  0.65 0.66 0.70 0.68  CALCIUM  --  8.7*  --  8.3* 8.5* 8.4* 8.9  MG 2.4 2.1 2.0 1.8 1.8  --   --   PHOS 2.3* 2.0* 1.6* 1.7* 2.7  --   --    GFR: Estimated Creatinine Clearance: 54.7 mL/min (by C-G formula based on SCr of 0.68 mg/dL). Recent Labs  Lab 07/20/18 0546 07/21/18 0554 07/22/18 0348 07/23/18 0444  WBC 7.6 9.9 8.5 7.5    Liver Function Tests: Recent Labs  Lab 07/17/18 0637 07/18/18 0235  AST 25 19  ALT 17 13  ALKPHOS 96 80  BILITOT 0.7 0.5  PROT 6.0* 5.1*  ALBUMIN 3.1* 2.6*   No results for input(s): LIPASE, AMYLASE in the last 168 hours. No results for input(s): AMMONIA in the last 168 hours.  ABG    Component Value Date/Time   PHART 7.345 (L)  07/18/2018 0452   PCO2ART 37.6 07/18/2018 0452   PO2ART 208.0 (H) 07/18/2018 0452   HCO3 20.6 07/18/2018 0452   TCO2 22 07/18/2018 0452   ACIDBASEDEF 5.0 (H) 07/18/2018 0452   O2SAT 100.0 07/18/2018 0452     Coagulation Profile: Recent Labs  Lab 07/18/18 0106 07/19/18 0517  INR 1.83 1.41    Cardiac Enzymes: No results for input(s): CKTOTAL, CKMB, CKMBINDEX, TROPONINI in the last 168 hours.  HbA1C: Hgb A1c MFr Bld  Date/Time Value Ref Range Status  07/13/2018 11:53 PM 6.3 (H) 4.8 - 5.6 % Final    Comment:    (NOTE) Pre diabetes:          5.7%-6.4% Diabetes:              >  6.4% Glycemic control for   <7.0% adults with diabetes   06/02/2017 07:41 AM 9.5 (H) 4.8 - 5.6 % Final    Comment:    (NOTE)         Prediabetes: 5.7 - 6.4         Diabetes: >6.4         Glycemic control for adults with diabetes: <7.0     CBG: Recent Labs  Lab 07/22/18 1136 07/22/18 1559 07/22/18 1933 07/22/18 2315 07/23/18 0345  GLUCAP 109* 198* 135* 168* 139*    App CCT 30 min  Richardson Landry Minor ACNP Maryanna Shape PCCM Pager [phone] till 1 pm If no answer page [phone] 07/23/2018, 10:10 AM  Attending Note:  67 year old female with CVA and seizure who underwent burst suppression.  Patient has been intubated for a week now and remains sleepy.  On exam, clear lungs noted.  I reviewed CXR myself, ETT is in a good position.  Discussed with PCCM-NP.  MRI pending for today.  May begin weaning but no extubation given mental status.  Will likely need to discuss trach/peg given duration of intubation and lack of improvement in mental status.  Improving, anticipate extubation in the next few days.  Hold off trach for now.  PCCM will continue to follow.  The patient is critically ill with multiple organ systems failure and requires high complexity decision making for assessment and support, frequent evaluation and titration of therapies, application of advanced monitoring technologies and extensive  interpretation of multiple databases.   Critical Care Time devoted to patient care services described in this note is  32  Minutes. This time reflects time of care of this signee Dr Jennet Maduro. This critical care time does not reflect procedure time, or teaching time or supervisory time of PA/NP/Med student/Med Resident etc but could involve care discussion time.  Rush Farmer, M.D. [facility] Pulmonary/Critical Care Medicine. Pager: [phone]. After hours pager: [phone].

## 2018-07-24 ENCOUNTER — Inpatient Hospital Stay (HOSPITAL_COMMUNITY): Payer: Medicare Other

## 2018-07-24 DIAGNOSIS — E118 Type 2 diabetes mellitus with unspecified complications: Secondary | ICD-10-CM

## 2018-07-24 LAB — BASIC METABOLIC PANEL
Anion gap: 8 (ref 5–15)
BUN: 10 mg/dL (ref 8–23)
CO2: 26 mmol/L (ref 22–32)
CREATININE: 0.72 mg/dL (ref 0.44–1.00)
Calcium: 8.9 mg/dL (ref 8.9–10.3)
Chloride: 106 mmol/L (ref 98–111)
GFR calc Af Amer: >60 mL/min (ref >60)
GLUCOSE: 160 mg/dL — AB (ref 70–99)
POTASSIUM: 3.6 mmol/L (ref 3.5–5.1)
Sodium: 140 mmol/L (ref 135–145)

## 2018-07-24 LAB — CBC WITH DIFFERENTIAL/PLATELET
ABS IMMATURE GRANULOCYTES: 0.1 10*3/uL (ref 0.0–0.1)
BASOS ABS: 0 10*3/uL (ref 0.0–0.1)
BASOS PCT: 1 %
EOS ABS: 0.4 10*3/uL (ref 0.0–0.7)
EOS PCT: 5 %
HCT: 28.8 % — ABNORMAL LOW (ref 36.0–46.0)
Hemoglobin: 8.8 g/dL — ABNORMAL LOW (ref 12.0–15.0)
Immature Granulocytes: 1 %
Lymphocytes Relative: 19 %
Lymphs Abs: 1.4 10*3/uL (ref 0.7–4.0)
MCH: 27.5 pg (ref 26.0–34.0)
MCHC: 30.6 g/dL (ref 30.0–36.0)
MCV: 90 fL (ref 78.0–100.0)
MONO ABS: 0.9 10*3/uL (ref 0.1–1.0)
MONOS PCT: 13 %
Neutro Abs: 4.4 10*3/uL (ref 1.7–7.7)
Neutrophils Relative %: 61 %
PLATELETS: 246 10*3/uL (ref 150–400)
RBC: 3.2 MIL/uL — ABNORMAL LOW (ref 3.87–5.11)
RDW: 15.9 % — AB (ref 11.5–15.5)
WBC: 7.2 10*3/uL (ref 4.0–10.5)

## 2018-07-24 LAB — GLUCOSE, CAPILLARY
GLUCOSE-CAPILLARY: 113 mg/dL — AB (ref 70–99)
Glucose-Capillary: 103 mg/dL — ABNORMAL HIGH (ref 70–99)
Glucose-Capillary: 138 mg/dL — ABNORMAL HIGH (ref 70–99)
Glucose-Capillary: 145 mg/dL — ABNORMAL HIGH (ref 70–99)
Glucose-Capillary: 161 mg/dL — ABNORMAL HIGH (ref 70–99)
Glucose-Capillary: 163 mg/dL — ABNORMAL HIGH (ref 70–99)

## 2018-07-24 LAB — HEPARIN LEVEL (UNFRACTIONATED)
HEPARIN UNFRACTIONATED: 0.11 [IU]/mL — AB (ref 0.30–0.70)
HEPARIN UNFRACTIONATED: 0.51 [IU]/mL (ref 0.30–0.70)
Heparin Unfractionated: 0.26 IU/mL — ABNORMAL LOW (ref 0.30–0.70)

## 2018-07-24 LAB — MAGNESIUM: Magnesium: 1.8 mg/dL (ref 1.7–2.4)

## 2018-07-24 LAB — PHOSPHORUS: Phosphorus: 3.2 mg/dL (ref 2.5–4.6)

## 2018-07-24 MED ORDER — FUROSEMIDE 10 MG/ML IJ SOLN
INTRAMUSCULAR | Status: AC
  Filled 2018-07-24: qty 4

## 2018-07-24 MED ORDER — RACEPINEPHRINE HCL 2.25 % IN NEBU
0.5000 mL | INHALATION_SOLUTION | Freq: Once | RESPIRATORY_TRACT | Status: DC
  Filled 2018-07-24: qty 0.5

## 2018-07-24 MED ORDER — MIDAZOLAM HCL 2 MG/2ML IJ SOLN
INTRAMUSCULAR | Status: AC
  Filled 2018-07-24: qty 2

## 2018-07-24 MED ORDER — FUROSEMIDE 10 MG/ML IJ SOLN
40.0000 mg | Freq: Once | INTRAMUSCULAR | Status: AC
  Administered 2018-07-24: 40 mg via INTRAVENOUS

## 2018-07-24 MED ORDER — FENTANYL CITRATE (PF) 100 MCG/2ML IJ SOLN
INTRAMUSCULAR | Status: AC
  Filled 2018-07-24: qty 4

## 2018-07-24 MED ORDER — RACEPINEPHRINE HCL 2.25 % IN NEBU
0.5000 mL | INHALATION_SOLUTION | Freq: Once | RESPIRATORY_TRACT | Status: AC
  Administered 2018-07-24: 0.5 mL via RESPIRATORY_TRACT

## 2018-07-24 MED ORDER — METHYLPREDNISOLONE SODIUM SUCC 125 MG IJ SOLR
60.0000 mg | Freq: Once | INTRAMUSCULAR | Status: AC
  Administered 2018-07-24: 60 mg via INTRAVENOUS
  Filled 2018-07-24: qty 2

## 2018-07-24 MED ORDER — RACEPINEPHRINE HCL 2.25 % IN NEBU
INHALATION_SOLUTION | RESPIRATORY_TRACT | Status: AC
  Administered 2018-07-24: 21:00:00
  Filled 2018-07-24: qty 0.5

## 2018-07-24 MED ORDER — METOPROLOL TARTRATE 25 MG PO TABS
25.0000 mg | ORAL_TABLET | Freq: Two times a day (BID) | ORAL | Status: DC
  Administered 2018-07-25 – 2018-07-29 (×8): 25 mg via ORAL
  Filled 2018-07-24 (×8): qty 1

## 2018-07-24 MED ORDER — GABAPENTIN 300 MG PO CAPS
300.0000 mg | ORAL_CAPSULE | Freq: Three times a day (TID) | ORAL | Status: DC
  Administered 2018-07-25 – 2018-08-07 (×39): 300 mg via ORAL
  Filled 2018-07-24 (×39): qty 1

## 2018-07-24 NOTE — Progress Notes (Signed)
Subjective: Significantly improved.  Exam: Vitals:   07/24/18 1330 07/24/18 1400  BP: 140/84 (!) 184/99  Pulse: 89 93  Resp: (!) 24 (!) 21  Temp:    SpO2: 97% 98%   Gen: In bed, intubated Resp: Ventilated Abd: soft, nt  Neuro: MS: Opens eyes and engages examiner, she follows commands to lift arms, wiggle toes, answers questions with nods/shakes CN: Pupils equal round reactive, eyes are conjugate,  Motor: She has a mild right hemiparesis, but does lift both sides against gravity.  She has persistent intermittent twitching of her right upper extremity. DTR: brisk throughout right side.  Sensory: As above  Pertinent Labs: Cr 0.65  Impression: 67 year old female with frequent clinical seizures following recent infarcts.  Due to the frequent nature and her failure to respond to initial antiepileptics, she was started on high-dose Versed with cessation of these discharges.  She has not had any further episodes concerning for seizure since that was started.  She is gradually waking up. I do not think the right arm movements are ictal, and EEG did not demonstrate cortical discharges with the movements. I think it is subcortical myoclonus. Benzos can me useful for this, but will try gabapentin as this would hopefully be less sedation.   She had a carotid thrombus found during work-up for her most recent stroke, and has been started on IV heparin.  If she tolerates being extubated without difficulty(e.g. will not need trach/PEG), would change to Eliquis.  Recommendations: 1) continue Keppra 1 g twice daily 2) continue vimpat 200mg  q12h 3) continue gabapentin 300 3 times daily 4) once extubated, consider changing heparin to Eliquis.  Roland Rack, MD [facility] [phone]  If 7pm- 7am, please page neurology on call as listed in [place]. 07/24/2018  2:23 PM

## 2018-07-24 NOTE — Progress Notes (Signed)
ANTICOAGULATION CONSULT NOTE - Follow Up Consult  Pharmacy Consult for Heparin Indication: stroke  Patient Measurements: Height: 5\' 2"  (157.5 cm) Weight: 132 lb 7.9 oz (60.1 kg) IBW/kg (Calculated) : 50.1 Heparin Dosing Weight:  59 kg  Vital Signs: Temp: 98.9 F (37.2 C) (10/06 1630) Temp Source: Axillary (10/06 1630) BP: 164/117 (10/06 1900) Pulse Rate: 103 (10/06 1930)  Labs: Recent Labs    07/22/18 0348  07/23/18 0444 07/24/18 0347 07/24/18 1128 07/24/18 1906  HGB 9.5*  --  9.1* 8.8*  --   --   HCT 30.9*  --  29.4* 28.8*  --   --   PLT 194  --  206 246  --   --   HEPARINUNFRC 0.23*   < > 0.32 0.11* 0.26* 0.51  CREATININE 0.70  --  0.68 0.72  --   --    < > = values in this interval not displayed.    Estimated Creatinine Clearance: 54.7 mL/min (by C-G formula based on SCr of 0.72 mg/dL).  Assessment: 66 yof continues on IV heparin for carotid thrombus and CVA. Heparin level now 0.51 on 850 units/h - will reduce slightly and recheck with am labs.    Goal of Therapy:  Heparin level 0.3-0.5 units/ml Monitor platelets by anticoagulation protocol: Yes   Plan:  Reduce heparin to 800 units/h Recheck with am labs  Arrie Senate, PharmD, BCPS Clinical Pharmacist [phone] Please check AMION for all [place] numbers 07/24/2018

## 2018-07-24 NOTE — Progress Notes (Signed)
  Speech Language Pathology Treatment: Dysphagia  Patient Details Name: [name] MRN: [id number] DOB: 1951/06/23 Today's Date: 07/24/2018 Time: 2395-3202 SLP Time Calculation (min) (ACUTE ONLY): 28 min  Assessment / Plan / Recommendation Clinical Impression  Pt was seen post-extubation to reassess oropharyngeal swallow and readiness to resume PO diet. Pt is very lethargic this afternoon, perhaps fatigued after PT session and working with nursing. She keeps her eyes closed, mouth open. Oral care was completed with suction, after which pt had audible wetness and increased noise upon inhalation. Max cues were provided to cough and swallow, but no swallow was initiated and only occasional, very weak coughing was produced. No phonation was heard despite cueing, although her daughter at bedside said that earlier today, when she was more alert, she was able to produce dysphonic speech. Given the above as well as pt's prolonged intubation, do not feel she is ready for any POs today. However, will continue to f/u for readiness as she becomes more alert, although I do anticipate her needing MBS/FEES (if willing to participate).   HPI HPI: Pt is a 67 y.o. female with medical history significant of DM 2, CVA with right sided deficits and dysphagia requiring PEG tube, anemia, GERD, HLD, HTN seizures disorder, chronic refractory headaches, debility, sp PEG and Trach now removed. Admitted with decreased responsiveness. MRI showed acute and subacute infarcts in the left hemisphere in the left MCA distribution raising possibility of embolic source. Note that based on 05/2018 OP SLP therapy documentation, patient tolerating a regular diet, thin liquids. BSE completed 9/26 this admission recomending NPO pending MBS due to inconsistent s/s of aspiration. Pt declined MBS and was started on a trial of full liquids, but then developed seizure activity and was intubated 9/29. Extubated 10/6.      SLP Plan  Continue  with current plan of care       Recommendations  Diet recommendations: NPO Medication Administration: Via alternative means                Oral Care Recommendations: Oral care QID Follow up Recommendations: Inpatient Rehab SLP Visit Diagnosis: Dysphagia, unspecified (R13.10) Plan: Continue with current plan of care       GO                [name] 07/24/2018, 4:03 PM  [name], M.A. [place] Acute Environmental education officer [phone] Office [phone]

## 2018-07-24 NOTE — Progress Notes (Signed)
RT NOTE: RT called to room by RN due to patient desating into the 70's. Upon arrival patient was on NRB with sats of 93%. Stat chest x-ray was ordered and MD paged. MD ordered RT to begin bipap and ordered Lasix to be administered. Patient placed on NIV of 14/5, 50% FIO2. Vitals are stable. RT will continue to monitor.

## 2018-07-24 NOTE — Progress Notes (Signed)
Per MD foley was placed and second dose of 40mg  of Lasix given.  MD wants patient to rest to help with her pulmonary edema.  Will continue to monitor patient.

## 2018-07-24 NOTE — Progress Notes (Signed)
ANTICOAGULATION CONSULT NOTE - Follow Up Consult  Pharmacy Consult for Heparin Indication: stroke  Patient Measurements: Height: 5\' 2"  (157.5 cm) Weight: 132 lb 7.9 oz (60.1 kg) IBW/kg (Calculated) : 50.1 Heparin Dosing Weight:  59 kg  Vital Signs: Temp: 98.3 F (36.8 C) (10/06 0328) Temp Source: Axillary (10/05 2346) BP: 148/80 (10/06 0300) Pulse Rate: 84 (10/06 0300)  Labs: Recent Labs    07/21/18 0554 07/22/18 0348  07/22/18 2125 07/23/18 0444 07/24/18 0347  HGB 9.9* 9.5*  --   --  9.1* 8.8*  HCT 32.1* 30.9*  --   --  29.4* 28.8*  PLT 178 194  --   --  206 246  HEPARINUNFRC 0.54 0.23*   < > 0.35 0.32 0.11*  CREATININE 0.66 0.70  --   --  0.68  --    < > = values in this interval not displayed.    Estimated Creatinine Clearance: 54.7 mL/min (by C-G formula based on SCr of 0.68 mg/dL).  Assessment: 45 yof continues on IV heparin for carotid thrombus and CVA. Heparin level now 0.11 units/ml on drip at 600 units/hr. Hgb is low but stable and platelets are WNL. No bleeding noted pre RN    Goal of Therapy:  Heparin level 0.3-0.5 units/ml Monitor platelets by anticoagulation protocol: Yes   Plan:  Increase heparin drip to 700 units/hr Check heparin level later today to confirm Daily heparin level and CBC  Excell Seltzer PharmD Clinical Pharmacist  07/24/2018, 4:48 AM

## 2018-07-24 NOTE — Procedures (Signed)
Extubation Procedure Note  Patient Details:   Name: [name] DOB: [birth year] MRN: [id number]   Airway Documentation:    Vent end date: 07/24/18 Vent end time: 0902   Evaluation  O2 sats: stable throughout Complications: No apparent complications Patient did tolerate procedure well. Bilateral Breath Sounds: Clear, Diminished   Yes   Patient extubated per order to 3L [name] with no apparent complications. Positive cuff leak was noted prior to extubation. Patient is alert and is able to speak. Vitals are stable. RT will continue to monitor.   Catie Chiao Clyda Greener 07/24/2018, 9:19 AM

## 2018-07-24 NOTE — Progress Notes (Addendum)
NAME:  [name], MRN:  [id number], DOB:  Oct 17, 1951, LOS: 65 ADMISSION DATE:  07/13/2018, CONSULTATION DATE: 07/17/2018 REFERRING MD: Dr. Cheral Marker, Neurology, CHIEF COMPLAINT: Seizures  Brief History   67 year old woman, history of prior CVAs with carotid artery disease, hypertension, hyperlipidemia, normal pressure hydrocephalus status post shunt.  Admitted with seizures and a new left MCA territory stroke.  Treated with heparin and antiplatelets. Had recurrent seizures and transferred to ICU 9/29 for Versed and seizure suppression.  Intubated and ventilated 9/29.  Significant Hospital Events   9/25 Admit  9/29 To ICU for burst suppression  07/24/2018 extubated  Consults: date of consult/date signed off & final recs:  Neurology 9/25  Procedures (surgical and bedside):  ETT 9/29 >> 07/24/2018   Significant Diagnostic Tests:  CT head 9/25 >> evolutionary changes of multiple subcortical infarcts largest in the high left frontal lobe MRI brain 9/25 >> acute multifocal left frontoparietal MCA territory infarcts, old left greater than right MCA territory infarcts, chronic small vessel ischemic changes CT angio head and neck 9/26 >> left ICA 60% stenosis, severe stenosis of the proximal left A1 segment, mild narrowing distal portion right MCA M1 segment CT head 9/28 >> acute on chronic left MCA territory infarct without hemorrhagic conversion, old right MCA territory infarct LTM EEG 9/29 - 9/30 >> generalized burst of cortical myoclonus, focal motor seizure activity EEG 10/3 >> consistent with generalized nonspecific cerebral dysfunction (encephalopathy). No seizure or seizure predisposition. 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  Underlying subacute and chronic ischemia in the region.  Persistent hemorrhage but no malignant hemorrhage genetic transformation.  Micro Data:    Antimicrobials:    Subjective:  Much more  awake extubated without difficult  Objective   Blood pressure (!) 170/122, pulse (!) 111, temperature 98.3 F (36.8 C), temperature source Axillary, resp. rate 18, height 5\' 2"  (1.575 m), weight 60.1 kg, SpO2 98 %.    Vent Mode: PSV;CPAP FiO2 (%):  [30 %-32 %] 32 % Set Rate:  [14 bmp] 14 bmp Vt Set:  [400 mL] 400 mL PEEP:  [5 cmH20] 5 cmH20 Pressure Support:  [8 cmH20] 8 cmH20 Plateau Pressure:  [12 cmH20-13 cmH20] 12 cmH20   Intake/Output Summary (Last 24 hours) at 07/24/2018 6222 Last data filed at 07/24/2018 0000 Gross per 24 hour  Intake 1106.35 ml  Output -  Net 1106.35 ml   Filed Weights   07/19/18 0434 07/20/18 0500 07/21/18 0500  Weight: 60 kg 60.1 kg 60.1 kg    Examination: General: Elderly female extubated no acute distress HEENT: Left facial droop, no JVD or lymphadenopathy is appreciated Neuro: Arouses to voice follow commands speech is clear CV: s1s2 rrr, no m/r/g PULM: even/non-labored, lungs bilaterally mild rhonchi LN:LGXQ, non-tender, bsx4 active  Extremities: warm/dry, 1+ edema  Skin: no rashes or lesions    Resolved Hospital Problem list   Shock in the setting of sedation Respiratory failure  Assessment & Plan:   Acute respiratory failure due to mental status, inability to protect airway P: Extubated 07/24/2018 [place] toilet    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 History normal pressure hydrocephalus, shunt P: Neurology  Also following appreciate their input Continue antiseizure medication consider changes to p.o.  Acute left MCA stroke Chronic stroke, chronic left carotid disease Carotid Thrombus  P: Heparin per pharmacy Yesterday per neurology  Hypertension P: Continue current treatment   Relative thrombocytopenia - resolved P: Monitor CBC and  platelet  Hypophosphatemia P: Monitor and replete as needed  Hyperlipidemia P:  Monitor  Hyperglycemia CBG (last 3)  Recent Labs    07/23/18 1941  07/23/18 2344 07/24/18 0405  GLUCAP 96 185* 138*    P: SSI Adequate control   Disposition / Summary of Today's Plan 07/24/18   Extubated 07/24/2018 Swallow evaluation Mobilization If stable in 24 hour will transfer out    Diet: NPO.  07/24/2018 swallowing evaluation been ordered post extubation Pain/Anxiety/Delirium protocol (if indicated): Stop 07/24/2018 postextubation DVT prophylaxis: Heparin gtt GI prophylaxis: Pepcid   Hyperglycemia protocol: SSI Mobility: Bedrest given to mobilize 07/24/2018 Code Status: Full Family Communication: Family updated at bedside 07/24/2018   Labs   CBC: Recent Labs  Lab 07/18/18 0106 07/18/18 0235  07/20/18 0546 07/21/18 0554 07/22/18 0348 07/23/18 0444 07/24/18 0347  WBC 5.1 6.5   < > 7.6 9.9 8.5 7.5 7.2  NEUTROABS 3.4 4.5  --   --   --   --   --  4.4  HGB 8.5* 9.9*   < > 9.4* 9.9* 9.5* 9.1* 8.8*  HCT 27.6* 32.0*   < > 30.1* 32.1* 30.9* 29.4* 28.8*  MCV 89.9 87.7   < > 88.3 87.9 90.1 90.2 90.0  PLT 123* 172   < > 163 178 194 206 246   < > = values in this interval not displayed.    Basic Metabolic Panel: Recent Labs  Lab 07/19/18 0517 07/19/18 1645 07/20/18 0546 07/21/18 0554 07/22/18 0348 07/23/18 0444 07/24/18 0347  NA 141  --  138 136 138 141 140  K 4.6  --  3.5 3.7 4.3 3.8 3.6  CL 112*  --  109 105 104 106 106  CO2 18*  --  22 24 21* 24 26  GLUCOSE 157*  --  184* 145* 193* 163* 160*  BUN 9  --  16 10 9 10 10   CREATININE 0.85  --  0.65 0.66 0.70 0.68 0.72  CALCIUM 8.7*  --  8.3* 8.5* 8.4* 8.9 8.9  MG 2.1 2.0 1.8 1.8  --   --  1.8  PHOS 2.0* 1.6* 1.7* 2.7  --   --  3.2   GFR: Estimated Creatinine Clearance: 54.7 mL/min (by C-G formula based on SCr of 0.72 mg/dL). Recent Labs  Lab 07/21/18 0554 07/22/18 0348 07/23/18 0444 07/24/18 0347  WBC 9.9 8.5 7.5 7.2    Liver Function Tests: Recent Labs  Lab 07/18/18 0235  AST 19  ALT 13  ALKPHOS 80  BILITOT 0.5  PROT 5.1*  ALBUMIN 2.6*   No results for  input(s): LIPASE, AMYLASE in the last 168 hours. No results for input(s): AMMONIA in the last 168 hours.  ABG    Component Value Date/Time   PHART 7.345 (L) 07/18/2018 0452   PCO2ART 37.6 07/18/2018 0452   PO2ART 208.0 (H) 07/18/2018 0452   HCO3 20.6 07/18/2018 0452   TCO2 22 07/18/2018 0452   ACIDBASEDEF 5.0 (H) 07/18/2018 0452   O2SAT 100.0 07/18/2018 0452     Coagulation Profile: Recent Labs  Lab 07/18/18 0106 07/19/18 0517  INR 1.83 1.41    Cardiac Enzymes: No results for input(s): CKTOTAL, CKMB, CKMBINDEX, TROPONINI in the last 168 hours.  HbA1C: Hgb A1c MFr Bld  Date/Time Value Ref Range Status  07/13/2018 11:53 PM 6.3 (H) 4.8 - 5.6 % Final    Comment:    (NOTE) Pre diabetes:          5.7%-6.4% Diabetes:              >  6.4% Glycemic control for   <7.0% adults with diabetes   06/02/2017 07:41 AM 9.5 (H) 4.8 - 5.6 % Final    Comment:    (NOTE)         Prediabetes: 5.7 - 6.4         Diabetes: >6.4         Glycemic control for adults with diabetes: <7.0     CBG: Recent Labs  Lab 07/23/18 0800 07/23/18 1201 07/23/18 1941 07/23/18 2344 07/24/18 0405  GLUCAP 138* 172* 96 185* 138*    App CCT 30 min  Richardson Landry Minor ACNP Maryanna Shape PCCM Pager [phone] till 1 pm If no answer page [phone] 07/24/2018, 9:23 AM  Attending Note:  67 year old female with CVA and seizure who underwent burst suppression and is now stable, extubated yesterday.  On exam, lungs are clear.  I reviewed CXR myself, no acute disease noted.  Discussed with PCCM-NP.  Respiratory failure:  - Monitor for airway protection  - Ambulate  - IS  - Flutter valve  - Extubate  Hypoxemia:  - Titrate O2 for sat of 88-92%  Deconditioning:  - OOB  - PT/OT  Dysphagia:  - SLP  Malnutrition:  - Nutrition consult  The patient is critically ill with multiple organ systems failure and requires high complexity decision making for assessment and support, frequent evaluation and titration  of therapies, application of advanced monitoring technologies and extensive interpretation of multiple databases.   Critical Care Time devoted to patient care services described in this note is  34  Minutes. This time reflects time of care of this signee Dr Jennet Maduro. This critical care time does not reflect procedure time, or teaching time or supervisory time of PA/NP/Med student/Med Resident etc but could involve care discussion time.  Rush Farmer, M.D. [facility] Pulmonary/Critical Care Medicine. Pager: [phone]. After hours pager: [phone].

## 2018-07-24 NOTE — Progress Notes (Signed)
Patient started to desat into the 70s. Immediately was placed on NRB and sats increased to 93%.  MD and RT called.  MD ordered for stat chest x-ray, to begin bipap and ordered 40mg  Lasix.  Patient is stable.  Will continue to monitor patient.

## 2018-07-24 NOTE — Evaluation (Signed)
Physical Therapy Evaluation Patient Details Name: [name] MRN: [id number] DOB: 11-Oct-1951 Today's Date: 07/24/2018   History of Present Illness  67 year old woman, history of prior CVAs with carotid artery disease, hypertension, hyperlipidemia, normal pressure hydrocephalus status post shunt.  Admitted with seizures and a new left MCA territory stroke. . Had recurrent seizures and transferred to ICU 9/29 for Versed and seizure suppression.  Intubated and ventilated 9/29. Extubated 10/6.  Clinical Impression  Pt admitted with above. Pt extubated on earlier this morning. Pt with flat affect and resistive to OOB mobility. Pt was amb with cane in home, minA with bathing and was able to feed self up until a week ago. Pt now requiring maxA for all mobility. Recommend CIR to achieve min level of assist for safe transition home. Acute PT to cont to follow.    Follow Up Recommendations CIR    Equipment Recommendations  None recommended by PT    Recommendations for Other Services Rehab consult;OT consult     Precautions / Restrictions Precautions Precautions: Fall Restrictions Weight Bearing Restrictions: No Other Position/Activity Restrictions: dtr reports pt fell a week ago and has a R ankle contusion, bruising noted      Mobility  Bed Mobility Overal bed mobility: Needs Assistance Bed Mobility: Sidelying to Sit;Sit to Sidelying   Sidelying to sit: Max assist;+2 for physical assistance     Sit to sidelying: (pt impulsively returned self to supine in bed) General bed mobility comments: pt initiated LE movement to EOB, maxA for trunk elevation and to bring to EOB, pt with pushing posteriorly despite verbal cues to sit up straight and encouragement from daughter  Transfers Overall transfer level: Needs assistance Equipment used: (2 person lift with bed pad) Transfers: Sit to/from [employer] Transfers Sit to Stand: Max assist;+2 physical assistance Stand pivot  transfers: Max assist;+2 physical assistance       General transfer comment: pt unable to advance LEs, however unsure if it was physical deficits or pt resisting OOB mobiltiy  Ambulation/Gait             General Gait Details: unable this date  Stairs            Wheelchair Mobility    Modified Rankin (Stroke Patients Only) Modified Rankin (Stroke Patients Only) Pre-Morbid Rankin Score: Moderately severe disability Modified Rankin: Severe disability     Balance Overall balance assessment: Needs assistance Sitting-balance support: Feet unsupported Sitting balance-Leahy Scale: Zero Sitting balance - Comments: pt with posterior pushing trying to return to supine   Standing balance support: Bilateral upper extremity supported;During functional activity Standing balance-Leahy Scale: Poor Standing balance comment: dependent on physical assist                             Pertinent Vitals/Pain Pain Assessment: Faces Faces Pain Scale: No hurt    Home Living Family/patient expects to be discharged to:: Private residence Living Arrangements: Spouse/significant other;Children Available Help at Discharge: Family;Available PRN/intermittently Type of Home: Mobile home Home Access: Stairs to enter Entrance Stairs-Rails: Can reach both Entrance Stairs-Number of Steps: 4 Home Layout: One level Home Equipment: Walker - 2 wheels;Bedside commode;Grab bars - tub/shower;Tub bench Additional Comments: dtr reports she just had knee surgery and is out on FMLA    Prior Function Level of Independence: Needs assistance   Gait / Transfers Assistance Needed: walked with cane majority of time, walker some, and transfer w/c for long distance  ADL's / Homemaking  Assistance Needed: dtr reports she assists with bathing, pt can feed her self, doesn't drive        Hand Dominance   Dominant Hand: Left    Extremity/Trunk Assessment   Upper Extremity Assessment Upper  Extremity Assessment: RUE deficits/detail RUE Deficits / Details: increased tone noted with shoulder flexion/horizontal abduction RUE Coordination: decreased fine motor;decreased gross motor    Lower Extremity Assessment Lower Extremity Assessment: RLE deficits/detail;LLE deficits/detail RLE Deficits / Details: pt with limited participation making it difficult to accurately assess LLE Deficits / Details: pt with limited participation making it difficult to accurately assess    Cervical / Trunk Assessment Cervical / Trunk Assessment: Other exceptions Cervical / Trunk Exceptions: doesn't hold head up in sitting  Communication   Communication: (garbled speech)  Cognition Arousal/Alertness: Lethargic Behavior During Therapy: Flat affect(resistive to OOB) Overall Cognitive Status: Difficult to assess(pt didnt want to participate)                         Following Commands: Follows one step commands with increased time     Problem Solving: Slow processing;Decreased initiation;Difficulty sequencing;Requires verbal cues;Requires tactile cues General Comments: pt able to state number of fingers PT was holding up and initiated LE mvmt to EOB however resisted OOB to chair      General Comments General comments (skin integrity, edema, etc.): pt with bruising on R foot    Exercises     Assessment/Plan    PT Assessment Patient needs continued PT services  PT Problem List Decreased strength;Decreased mobility;Other (comment);Decreased activity tolerance;Decreased cognition;Decreased balance;Decreased knowledge of use of DME       PT Treatment Interventions DME instruction;Therapeutic activities;Cognitive remediation;Therapeutic exercise;Patient/family education;Balance training;Functional mobility training;Neuromuscular re-education    PT Goals (Current goals can be found in the Care Plan section)  Acute Rehab PT Goals Patient Stated Goal: didn't state PT Goal Formulation:  Patient unable to participate in goal setting Time For Goal Achievement: 08/07/18 Potential to Achieve Goals: Fair    Frequency Min 4X/week   Barriers to discharge Decreased caregiver support pt's daughter unable to care for patient due to recent knee surgery    Co-evaluation               AM-PAC PT "6 Clicks" Daily Activity  Outcome Measure Difficulty turning over in bed (including adjusting bedclothes, sheets and blankets)?: Unable Difficulty moving from lying on back to sitting on the side of the bed? : Unable Difficulty sitting down on and standing up from a chair with arms (e.g., wheelchair, bedside commode, etc,.)?: Unable Help needed moving to and from a bed to chair (including a wheelchair)?: Total Help needed walking in hospital room?: Total Help needed climbing 3-5 steps with a railing? : Total 6 Click Score: 6    End of Session Equipment Utilized During Treatment: Gait belt Activity Tolerance: Patient limited by lethargy Patient left: in chair;with call bell/phone within reach;with family/visitor present Nurse Communication: Mobility status PT Visit Diagnosis: Other abnormalities of gait and mobility (R26.89);History of falling (Z91.81);Other symptoms and signs involving the nervous system (R29.898);Muscle weakness (generalized) (M62.81)    Time: 2355-7322 PT Time Calculation (min) (ACUTE ONLY): 20 min   Charges:   PT Evaluation $PT Eval Moderate Complexity: 1 Mod          Kittie Plater, PT, DPT Acute Rehabilitation Services Pager #: [phone] Office #: [phone]   Berline Lopes 07/24/2018, 2:42 PM

## 2018-07-25 ENCOUNTER — Inpatient Hospital Stay (HOSPITAL_COMMUNITY): Payer: Medicare Other

## 2018-07-25 LAB — BASIC METABOLIC PANEL
ANION GAP: 17 — AB (ref 5–15)
ANION GAP: 13 (ref 5–15)
BUN: 11 mg/dL (ref 8–23)
BUN: 15 mg/dL (ref 8–23)
CALCIUM: 10.1 mg/dL (ref 8.9–10.3)
CHLORIDE: 97 mmol/L — AB (ref 98–111)
CO2: 27 mmol/L (ref 22–32)
CO2: 32 mmol/L (ref 22–32)
CREATININE: 0.82 mg/dL (ref 0.44–1.00)
Calcium: 9.8 mg/dL (ref 8.9–10.3)
Chloride: 98 mmol/L (ref 98–111)
Creatinine, Ser: 0.92 mg/dL (ref 0.44–1.00)
GFR calc Af Amer: >60 mL/min (ref >60)
GFR calc Af Amer: >60 mL/min (ref >60)
Glucose, Bld: 111 mg/dL — ABNORMAL HIGH (ref 70–99)
Glucose, Bld: 160 mg/dL — ABNORMAL HIGH (ref 70–99)
POTASSIUM: 3.8 mmol/L (ref 3.5–5.1)
Potassium: 3.2 mmol/L — ABNORMAL LOW (ref 3.5–5.1)
SODIUM: 141 mmol/L (ref 135–145)
SODIUM: 143 mmol/L (ref 135–145)

## 2018-07-25 LAB — GLUCOSE, CAPILLARY
GLUCOSE-CAPILLARY: 158 mg/dL — AB (ref 70–99)
GLUCOSE-CAPILLARY: 150 mg/dL — AB (ref 70–99)
GLUCOSE-CAPILLARY: 136 mg/dL — AB (ref 70–99)
Glucose-Capillary: 152 mg/dL — ABNORMAL HIGH (ref 70–99)
Glucose-Capillary: 113 mg/dL — ABNORMAL HIGH (ref 70–99)
Glucose-Capillary: 127 mg/dL — ABNORMAL HIGH (ref 70–99)
Glucose-Capillary: 205 mg/dL — ABNORMAL HIGH (ref 70–99)

## 2018-07-25 LAB — CBC WITH DIFFERENTIAL/PLATELET
Abs Immature Granulocytes: 0.1 K/uL (ref 0.0–0.1)
Basophils Absolute: 0 K/uL (ref 0.0–0.1)
Basophils Relative: 1 %
Eosinophils Absolute: 0 K/uL (ref 0.0–0.7)
Eosinophils Relative: 0 %
HCT: 36.1 % (ref 36.0–46.0)
Hemoglobin: 11.2 g/dL — ABNORMAL LOW (ref 12.0–15.0)
Immature Granulocytes: 1 %
Lymphocytes Relative: 14 %
Lymphs Abs: 1.2 K/uL (ref 0.7–4.0)
MCH: 27.7 pg (ref 26.0–34.0)
MCHC: 31 g/dL (ref 30.0–36.0)
MCV: 89.4 fL (ref 78.0–100.0)
Monocytes Absolute: 0.4 K/uL (ref 0.1–1.0)
Monocytes Relative: 5 %
Neutro Abs: 6.7 K/uL (ref 1.7–7.7)
Neutrophils Relative %: 79 %
Platelets: 339 K/uL (ref 150–400)
RBC: 4.04 MIL/uL (ref 3.87–5.11)
RDW: 15.2 % (ref 11.5–15.5)
WBC: 8.4 K/uL (ref 4.0–10.5)

## 2018-07-25 LAB — PHOSPHORUS: PHOSPHORUS: 5.9 mg/dL — AB (ref 2.5–4.6)

## 2018-07-25 LAB — HEPARIN LEVEL (UNFRACTIONATED)
HEPARIN UNFRACTIONATED: 0.68 [IU]/mL (ref 0.30–0.70)
Heparin Unfractionated: 0.37 [IU]/mL (ref 0.30–0.70)

## 2018-07-25 LAB — MAGNESIUM: MAGNESIUM: 1.8 mg/dL (ref 1.7–2.4)

## 2018-07-25 MED ORDER — ORAL CARE MOUTH RINSE
15.0000 mL | Freq: Two times a day (BID) | OROMUCOSAL | Status: DC
  Administered 2018-07-25 – 2018-08-12 (×34): 15 mL via OROMUCOSAL

## 2018-07-25 MED ORDER — JEVITY 1.2 CAL PO LIQD
1000.0000 mL | ORAL | Status: DC
  Administered 2018-07-25: 17:00:00
  Administered 2018-07-26 – 2018-08-01 (×7): 1000 mL
  Filled 2018-07-25 (×15): qty 1000

## 2018-07-25 MED ORDER — CHLORHEXIDINE GLUCONATE 0.12 % MT SOLN
15.0000 mL | Freq: Two times a day (BID) | OROMUCOSAL | Status: DC
  Administered 2018-07-25 – 2018-08-13 (×39): 15 mL via OROMUCOSAL
  Filled 2018-07-25 (×38): qty 15

## 2018-07-25 NOTE — Progress Notes (Signed)
ANTICOAGULATION CONSULT NOTE - Follow Up Consult  Pharmacy Consult for Heparin Indication: stroke  Patient Measurements: Height: 5\' 2"  (157.5 cm) Weight: 132 lb 7.9 oz (60.1 kg) IBW/kg (Calculated) : 50.1 Heparin Dosing Weight:  59 kg  Vital Signs: Temp: 98.2 F (36.8 C) (10/07 2000) Temp Source: Oral (10/07 2000) BP: 138/86 (10/07 2000) Pulse Rate: 102 (10/07 2000)  Labs: Recent Labs    07/23/18 0444 07/24/18 0347  07/24/18 1906 07/24/18 2358 07/25/18 0453 07/25/18 0736 07/25/18 1816  HGB 9.1* 8.8*  --   --   --   --  11.2*  --   HCT 29.4* 28.8*  --   --   --   --  36.1  --   PLT 206 246  --   --   --   --  339  --   HEPARINUNFRC 0.32 0.11*   < > 0.51  --  0.68  --  0.37  CREATININE 0.68 0.72  --   --  0.82 0.92  --   --    < > = values in this interval not displayed.    Estimated Creatinine Clearance: 47.6 mL/min (by C-G formula based on SCr of 0.92 mg/dL).  Assessment: 59 yof continues on IV heparin for carotid thrombus and CVA. Heparin level therapeutic this PM. We will confirm with AM level.     Goal of Therapy:  Heparin level 0.3-0.5 units/ml Monitor platelets by anticoagulation protocol: Yes   Plan:  Continue heparin at 750 units/h Daily hep level  Onnie Boer, PharmD, [place], AAHIVP, CPP Infectious Disease Pharmacist Pager: [phone] 07/25/2018 8:31 PM

## 2018-07-25 NOTE — Progress Notes (Signed)
Neurology Progress Note   S:// Patient seen and examined. She was extubated successfully overnight. She did develop some stridor which is being managed by respiratory therapy and critical care medicine. Currently awake and responsive-see detailed exam below.   O:// Current vital signs: BP (!) 128/113   Pulse 97   Temp 98.1 F (36.7 C) (Axillary)   Resp (!) 33   Ht 5\' 2"  (1.575 m)   Wt 60.1 kg   SpO2 100%   BMI 24.23 kg/m  Vital signs in last 24 hours: Temp:  [98.1 F (36.7 C)-98.9 F (37.2 C)] 98.1 F (36.7 C) (10/07 0400) Pulse Rate:  [86-112] 97 (10/07 0729) Resp:  [13-33] 33 (10/07 0729) BP: (124-197)/(74-127) 128/113 (10/07 0729) SpO2:  [93 %-100 %] 100 % (10/07 0729) FiO2 (%):  [32 %-100 %] 100 % (10/06 2210) GENERAL: Well-developed well-nourished, awake in no distress HEENT: - Normocephalic and atraumatic, dry mm, no LN++, no Thyromegally LUNGS - Clear to auscultation bilaterally with no wheezes CV - S1S2 RRR, no m/r/g, equal pulses bilaterally. ABDOMEN - Soft, nontender, nondistended with normoactive BS Ext: warm, well perfused, intact peripheral pulses, no edema NEURO:  Mental Status: Awake alert oriented to self and place.  Not oriented to time.  Able to answer questions appropriately with yes and no.  Unable to tell me the year of her birth or her age correctly Language: speech is mildly dysarthric and hypophonic.  Poor attention concentration at this time.  Unable to participate in testing naming and repetition.  Able to comprehend simple commands consistently but unable to follow multistep commands. Cranial Nerves: PERRL . EOMI, visual fields full, no facial asymmetry Motor: Able to raise both arms up against gravity but is weaker on the right upper extremity 4/5 with right grip strength barely 2/5.  Able to lift both legs against gravity with mild paresis of the right lower extremity-4/5. Tone: is normal and bulk is normal Sensation- Intact to light touch  bilaterally Coordination: FTN intact bilaterally Gait- deferred DTRs-brisk throughout the right side  Medications  Current Facility-Administered Medications:  .  0.9 %  sodium chloride infusion, , Intravenous, Continuous, Roxan Hockey, MD, Stopped at 07/23/18 2355 .  acetaminophen (TYLENOL) tablet 650 mg, 650 mg, Oral, Q4H PRN, 650 mg at 07/16/18 2056 **OR** acetaminophen (TYLENOL) solution 650 mg, 650 mg, Per Tube, Q4H PRN **OR** acetaminophen (TYLENOL) suppository 650 mg, 650 mg, Rectal, Q4H PRN, Doutova, Anastassia, MD, 650 mg at 07/24/18 1225 .  chlorhexidine (PERIDEX) 0.12 % solution 15 mL, 15 mL, Mouth Rinse, BID, Agarwala, Ravi, MD .  famotidine (PEPCID) IVPB 20 mg premix, 20 mg, Intravenous, Q12H, Etheleen Nicks, MD, Stopped at 07/24/18 2242 .  feeding supplement (VITAL AF 1.2 CAL) liquid 1,000 mL, 1,000 mL, Per Tube, Continuous, Kipp Brood, MD, Stopped at 07/24/18 0800 .  gabapentin (NEURONTIN) capsule 300 mg, 300 mg, Oral, TID, Minor, Grace Bushy, NP .  gemfibrozil (LOPID) tablet 600 mg, 600 mg, Oral, BID AC, Emokpae, Courage, MD, 600 mg at 07/23/18 1651 .  heparin ADULT infusion 100 units/mL (25000 units/238mL sodium chloride 0.45%), 800 Units/hr, Intravenous, Continuous, Einar Grad, RPH, Last Rate: 8 mL/hr at 07/25/18 0735, 800 Units/hr at 07/25/18 0735 .  Influenza vac split quadrivalent PF (FLUZONE HIGH-DOSE) injection 0.5 mL, 0.5 mL, Intramuscular, Tomorrow-1000, Deterding, Elizabeth C, MD .  insulin aspart (novoLOG) injection 0-9 Units, 0-9 Units, Subcutaneous, Q4H, Doutova, Anastassia, MD, 2 Units at 07/25/18 0539 .  labetalol (NORMODYNE,TRANDATE) injection 10 mg, 10 mg, Intravenous, Q2H  PRN, Etheleen Nicks, MD, 10 mg at 07/24/18 2023 .  lacosamide (VIMPAT) 200 mg in sodium chloride 0.9 % 25 mL IVPB, 200 mg, Intravenous, Q12H, Greta Doom, MD, Stopped at 07/24/18 2205 .  levETIRAcetam (KEPPRA) IVPB 1000 mg/100 mL premix, 1,000 mg, Intravenous, Q12H,  Kerney Elbe, MD, Last Rate: 400 mL/hr at 07/24/18 2028, 1,000 mg at 07/24/18 2028 .  MEDLINE mouth rinse, 15 mL, Mouth Rinse, q12n4p, Agarwala, Ravi, MD .  metoprolol tartrate (LOPRESSOR) tablet 25 mg, 25 mg, Oral, BID, Minor, Grace Bushy, NP .  ondansetron (ZOFRAN) injection 4 mg, 4 mg, Intravenous, Q6H PRN, Bodenheimer, Charles A, NP, 4 mg at 07/22/18 1048 .  Racepinephrine HCl 2.25 % nebulizer solution 0.5 mL, 0.5 mL, Nebulization, Once, Omar Person, NP Labs CBC    Component Value Date/Time   WBC 7.2 07/24/2018 0347   RBC 3.20 (L) 07/24/2018 0347   HGB 8.8 (L) 07/24/2018 0347   HCT 28.8 (L) 07/24/2018 0347   PLT 246 07/24/2018 0347   MCV 90.0 07/24/2018 0347   MCH 27.5 07/24/2018 0347   MCHC 30.6 07/24/2018 0347   RDW 15.9 (H) 07/24/2018 0347   LYMPHSABS 1.4 07/24/2018 0347   MONOABS 0.9 07/24/2018 0347   EOSABS 0.4 07/24/2018 0347   BASOSABS 0.0 07/24/2018 0347    CMP     Component Value Date/Time   NA 141 07/25/2018 0453   NA 137 01/15/2016 1632   K 3.8 07/25/2018 0453   CL 97 (L) 07/25/2018 0453   CO2 27 07/25/2018 0453   GLUCOSE 160 (H) 07/25/2018 0453   BUN 15 07/25/2018 0453   BUN 14 01/15/2016 1632   CREATININE 0.92 07/25/2018 0453   CALCIUM 9.8 07/25/2018 0453   PROT 5.1 (L) 07/18/2018 0235   ALBUMIN 2.6 (L) 07/18/2018 0235   AST 19 07/18/2018 0235   ALT 13 07/18/2018 0235   ALKPHOS 80 07/18/2018 0235   BILITOT 0.5 07/18/2018 0235   GFRNONAA >60 07/25/2018 0453   GFRAA >60 07/25/2018 0453    Imaging I have reviewed images in epic and the results pertinent to this consultation are: No new imaging to review.  Assessment:  67 year old woman with a recent scattered embolic strokes in a watershed pattern in the left cerebral hemisphere with a left ICA 60% stenosis and a left ICA thrombus, started exhibiting frequent seizures on the right.  She was started on multiple antiepileptics and continued to have breakthrough seizures and then started on  high-dose Versed with cessation of the discharges on EEG.  She continues to have some twitching on the right upper extremity intermittently, which is likely subcortical myoclonus versus the hyperreflexia from her stroke. Gabapentin was added yesterday to her Keppra, Vimpat. Remains extubated since yesterday and is tolerating it decently for now. If she is able to breathe on her own and no procedures are planned, will consider changing to oral anticoagulations tomorrow for the left ICA thrombus-currently on heparin drip.   Impression: Recent left hemispheric stroke Partial seizures originating from the left hemisphere with status epilepticus that has resolved Possible myoclonus involving the right arm  Recommendations: Continue Keppra 1 g twice daily Continue Vimpat 200 mg every 12 hours Continue gabapentin 303 times daily Monitor clinically.  If remains stable and able to tolerate being extubated and no procedures are planned, can start Eliquis tomorrow.  Please call with questions.  -- Amie Portland, MD [facility] Pager: [phone] If 7pm to 7am, please call on call as listed on AMION.

## 2018-07-25 NOTE — Progress Notes (Signed)
ANTICOAGULATION CONSULT NOTE - Follow Up Consult  Pharmacy Consult for Heparin Indication: stroke  Patient Measurements: Height: 5\' 2"  (157.5 cm) Weight: 132 lb 7.9 oz (60.1 kg) IBW/kg (Calculated) : 50.1 Heparin Dosing Weight:  59 kg  Vital Signs: Temp: 97.4 F (36.3 C) (10/07 0800) Temp Source: Axillary (10/07 0800) BP: 128/113 (10/07 0729) Pulse Rate: 97 (10/07 0729)  Labs: Recent Labs    07/23/18 0444 07/24/18 0347 07/24/18 1128 07/24/18 1906 07/24/18 2358 07/25/18 0453 07/25/18 0736  HGB 9.1* 8.8*  --   --   --   --  11.2*  HCT 29.4* 28.8*  --   --   --   --  36.1  PLT 206 246  --   --   --   --  339  HEPARINUNFRC 0.32 0.11* 0.26* 0.51  --  0.68  --   CREATININE 0.68 0.72  --   --  0.82 0.92  --     Estimated Creatinine Clearance: 47.6 mL/min (by C-G formula based on SCr of 0.92 mg/dL).  Assessment: 70 yof continues on IV heparin for carotid thrombus and CVA. Heparin level this am 0.68 on 800 units/h.    Goal of Therapy:  Heparin level 0.3-0.5 units/ml Monitor platelets by anticoagulation protocol: Yes   Plan:  Reduce heparin to 750 units/h Recheck HL this afternoon and with am labs   Alanda Slim, PharmD, [facility] Clinical Pharmacist Please see AMION for all Pharmacists' Contact Phone Numbers 07/25/2018, 9:56 AM

## 2018-07-25 NOTE — Progress Notes (Signed)
Pt developed stridor.  Racemic neb given x2 with no improvement.  Per MD order, to place pt on Heliox.  Pt placed on Heliox 80/20 through [facility].  Pt looks comfortable VS stable. Will continue to monitor.

## 2018-07-25 NOTE — Progress Notes (Signed)
Inpatient Rehabilitation-Admissions Coordinator   [facility] continues to follow pt for post acute rehab needs.  Jhonnie Garner, OTR/L  Rehab Admissions Coordinator  [phone] 07/25/2018 4:53 PM

## 2018-07-25 NOTE — Progress Notes (Signed)
NAME:  [name], MRN:  [id number], DOB:  [birth year], LOS: 89 ADMISSION DATE:  07/13/2018, CONSULTATION DATE: 07/17/2018 REFERRING MD: Dr. Cheral Marker, Neurology, CHIEF COMPLAINT: Seizures  Brief History   67 year old woman, history of prior CVAs with carotid artery disease, hypertension, hyperlipidemia, normal pressure hydrocephalus status post shunt.  Admitted with seizures and a new left MCA territory stroke.  Treated with heparin and antiplatelets. Had recurrent seizures and transferred to ICU 9/29 for Versed and seizure suppression.  Intubated and ventilated 9/29.  Significant Hospital Events   9/25 Admit  9/29 To ICU for burst suppression  07/24/2018 extubated, had post extubation stridor treated with racemic epi and heliox, started on BiPAP overnight 10/7 Off BiPAP  Consults: date of consult/date signed off & final recs:  Neurology 9/25  Procedures (surgical and bedside):  ETT 9/29 >> 07/24/2018   Significant Diagnostic Tests:  CT head 9/25 >> evolutionary changes of multiple subcortical infarcts largest in the high left frontal lobe MRI brain 9/25 >> acute multifocal left frontoparietal MCA territory infarcts, old left greater than right MCA territory infarcts, chronic small vessel ischemic changes CT angio head and neck 9/26 >> left ICA 60% stenosis, severe stenosis of the proximal left A1 segment, mild narrowing distal portion right MCA M1 segment CT head 9/28 >> acute on chronic left MCA territory infarct without hemorrhagic conversion, old right MCA territory infarct LTM EEG 9/29 - 9/30 >> generalized burst of cortical myoclonus, focal motor seizure activity EEG 10/3 >> consistent with generalized nonspecific cerebral dysfunction (encephalopathy). No seizure or seizure predisposition. 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  Underlying subacute and chronic ischemia in the region.  Persistent hemorrhage but no  malignant hemorrhage genetic transformation.  Micro Data:    Antimicrobials:    Subjective:  Had stridor overnight treated with racemic epi, heliox.  Required BiPAP but now off and on 4L [place].  Objective   Blood pressure (!) 128/113, pulse 97, temperature (!) 97.4 F (36.3 C), temperature source Axillary, resp. rate (!) 33, height 5\' 2"  (1.575 m), weight 60.1 kg, SpO2 100 %.    Vent Mode: BIPAP;PSV FiO2 (%):  [50 %-100 %] 100 % PEEP:  [5 cmH20] 5 cmH20 Pressure Support:  [14 cmH20] 14 cmH20   Intake/Output Summary (Last 24 hours) at 07/25/2018 1118 Last data filed at 07/25/2018 0600 Gross per 24 hour  Intake 499.78 ml  Output 2850 ml  Net -2350.22 ml   Filed Weights   07/19/18 0434 07/20/18 0500 07/21/18 0500  Weight: 60 kg 60.1 kg 60.1 kg    Examination:  General: Elderly female, somnolent but no distress HEENT: [place] / AT. MM dry. EOMI Neuro: Somnolent but arouses to voice, generalized weakness.  CV: RRR, no M/R/G PULM: even/non-labored, CTAB.  No stridor ZH:GDJM, non-tender, bsx4 active  Extremities: warm/dry, no edema  Skin: no rashes or lesions   Assessment & Plan:   Acute respiratory failure due to mental status, inability to protect airway.  Extubated 10/6. P: Bronchial hygiene CXR intermittently  Left basilar atelectasis P: Incentive spirometry / bronchial hygiene   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 Possible myoclonus involving right arm History normal pressure hydrocephalus with shunt P: Neurology was following, recommends to continue current AEDs (keppra, vimpat, gabapentin)  Acute left MCA stroke Chronic stroke, chronic left carotid disease Carotid Thrombus  P: Heparin per pharmacy  Hypertension P: Continue labetalol lopressor  Relative thrombocytopenia - resolved P: Monitor CBC  and platelet  Hyperlipidemia P: Continue gemfibrozil  Hyperglycemia P: Continue SSI  Malnutrition - has failed SLP evals. P: Place  cortrak and start tube feeds per nutrition Continue swallow efforts per SLP  Generalized weakness. P: PT / OT eval.   Disposition / Summary of Today's Plan 07/25/18   If remains stable through this afternoon, then transfer out of ICU to SDU and will ask TRH to assume care in AM 10/8. Place cortrak today and start TF's. Continue SLP and PT / OT efforts.    Diet: NPO.  Start TF's and continue SLP efforts Pain/Anxiety/Delirium protocol (if indicated): None DVT prophylaxis: Heparin gtt GI prophylaxis: None Hyperglycemia protocol: SSI Mobility: Bedrest while awaiting PT eval Code Status: Full Family Communication: Family updated at bedside 10/7   Labs   CBC: Recent Labs  Lab 07/21/18 0554 07/22/18 0348 07/23/18 0444 07/24/18 0347 07/25/18 0736  WBC 9.9 8.5 7.5 7.2 8.4  NEUTROABS  --   --   --  4.4 6.7  HGB 9.9* 9.5* 9.1* 8.8* 11.2*  HCT 32.1* 30.9* 29.4* 28.8* 36.1  MCV 87.9 90.1 90.2 90.0 89.4  PLT 178 194 206 246 427    Basic Metabolic Panel: Recent Labs  Lab 07/19/18 1645 07/20/18 0546 07/21/18 0554 07/22/18 0348 07/23/18 0444 07/24/18 0347 07/24/18 2358 07/25/18 0453  NA  --  138 136 138 141 140 143 141  K  --  3.5 3.7 4.3 3.8 3.6 3.2* 3.8  CL  --  109 105 104 106 106 98 97*  CO2  --  22 24 21* 24 26 32 27  GLUCOSE  --  184* 145* 193* 163* 160* 111* 160*  BUN  --  16 10 9 10 10 11 15   CREATININE  --  0.65 0.66 0.70 0.68 0.72 0.82 0.92  CALCIUM  --  8.3* 8.5* 8.4* 8.9 8.9 10.1 9.8  MG 2.0 1.8 1.8  --   --  1.8  --  1.8  PHOS 1.6* 1.7* 2.7  --   --  3.2  --  5.9*   GFR: Estimated Creatinine Clearance: 47.6 mL/min (by C-G formula based on SCr of 0.92 mg/dL). Recent Labs  Lab 07/22/18 0348 07/23/18 0444 07/24/18 0347 07/25/18 0736  WBC 8.5 7.5 7.2 8.4    Liver Function Tests: No results for input(s): AST, ALT, ALKPHOS, BILITOT, PROT, ALBUMIN in the last 168 hours. No results for input(s): LIPASE, AMYLASE in the last 168 hours. No results for  input(s): AMMONIA in the last 168 hours.  ABG    Component Value Date/Time   PHART 7.345 (L) 07/18/2018 0452   PCO2ART 37.6 07/18/2018 0452   PO2ART 208.0 (H) 07/18/2018 0452   HCO3 20.6 07/18/2018 0452   TCO2 22 07/18/2018 0452   ACIDBASEDEF 5.0 (H) 07/18/2018 0452   O2SAT 100.0 07/18/2018 0452     Coagulation Profile: Recent Labs  Lab 07/19/18 0517  INR 1.41    Cardiac Enzymes: No results for input(s): CKTOTAL, CKMB, CKMBINDEX, TROPONINI in the last 168 hours.  HbA1C: Hgb A1c MFr Bld  Date/Time Value Ref Range Status  07/13/2018 11:53 PM 6.3 (H) 4.8 - 5.6 % Final    Comment:    (NOTE) Pre diabetes:          5.7%-6.4% Diabetes:              >6.4% Glycemic control for   <7.0% adults with diabetes   06/02/2017 07:41 AM 9.5 (H) 4.8 - 5.6 % Final  Comment:    (NOTE)         Prediabetes: 5.7 - 6.4         Diabetes: >6.4         Glycemic control for adults with diabetes: <7.0     CBG: Recent Labs  Lab 07/24/18 2340 07/25/18 0320 07/25/18 0536 07/25/18 0811 07/25/18 1051  GLUCAP 103* 158* 150* 113* 127*    Montey Hora, [place] - [facility] [place] [facility] Critical [facility] Medicine Pager: [phone]  or [phone] 07/25/2018, 11:35 AM

## 2018-07-25 NOTE — Procedures (Signed)
Cortrak  Person Inserting Tube:  Swayzee Wadley, RD Tube Type:  Cortrak - 43 inches Tube Location:  Left nare Initial Placement:  Postpyloric Secured by: Bridle Technique Used to Measure Tube Placement:  Documented cm marking at nare/ corner of mouth Cortrak Secured At:  73 cm Procedure Comments:    X-ray is required, abdominal x-ray has been ordered by the Cortrak team. Please confirm tube placement before using the Cortrak tube.   If the tube becomes dislodged please keep the tube and contact the Cortrak team at [email or web address] ([email or web address]) for replacement.  If after hours and replacement cannot be delayed, place a NG tube and confirm placement with an abdominal x-ray.

## 2018-07-25 NOTE — Progress Notes (Signed)
  Speech Language Pathology Treatment: Dysphagia;Cognitive-Linquistic  Patient Details Name: [name] MRN: [id number] DOB: 1951/05/19 Today's Date: 07/25/2018 Time: [id number] SLP Time Calculation (min) (ACUTE ONLY): 17 min  Assessment / Plan / Recommendation Clinical Impression  Events of overnight noted during chart review. Pt can be better aroused today than during initial eval, although she remains lethargic. She can produce some phonation today, although hoarse and brief in duration. She is oriented to person, location, and situation (did not assess time). Max cues were provided for sustained attention to oral care. Small amount of water via swab was administered, but pt had limited lingual/labial response and no attempts at swallowing. Audible wetness and weak coughing were elicited, suggestive of premature spillage into the airway. When cued to cough with more force, SLP retrieved a small amount of frothy, thin secretions via yankauer. Recommend that pt remain NPO given current mentation and swallow function as well as more tenuous respiratory status. Pending improved mentation, may wish to consider temporary, alternative means of nutrition.    HPI HPI: Pt is a 67 y.o. female with medical history significant of DM 2, CVA with right sided deficits and dysphagia requiring PEG tube, anemia, GERD, HLD, HTN seizures disorder, chronic refractory headaches, debility, sp PEG and Trach now removed. Admitted with decreased responsiveness. MRI showed acute and subacute infarcts in the left hemisphere in the left MCA distribution raising possibility of embolic source. Note that based on 05/2018 OP SLP therapy documentation, patient tolerating a regular diet, thin liquids. BSE completed 9/26 this admission recomending NPO pending MBS due to inconsistent s/s of aspiration. Pt declined MBS and was started on a trial of full liquids, but then developed seizure activity and was intubated 9/29. Extubated  10/6.      SLP Plan  Continue with current plan of care       Recommendations  Diet recommendations: NPO Medication Administration: Via alternative means                Oral Care Recommendations: Oral care QID Follow up Recommendations: Inpatient Rehab SLP Visit Diagnosis: Dysphagia, unspecified (R13.10);Cognitive communication deficit (R51.884) Plan: Continue with current plan of care       GO                [name] 07/25/2018, 11:35 AM  [name], M.A. [place] Acute Environmental education officer [phone] Office [phone]

## 2018-07-25 NOTE — Progress Notes (Signed)
Nutrition Follow-up  DOCUMENTATION CODES:   Not applicable  INTERVENTION:   Jevity 1.2 @ 60 ml/hr (1440 ml/day) via Cortrak tube  Provides: 1728 kcal, 80 grams protein, and 1167 ml free water.    NUTRITION DIAGNOSIS:   Inadequate oral intake related to inability to eat as evidenced by NPO status. Ongoing.   GOAL:   Patient will meet greater than or equal to 90% of their needs Progressing.   MONITOR:   TF tolerance, Vent status  ASSESSMENT:   67 y.o. female with medical history significant of DM 2, CVA, anemia, GERD, HLD, HTN seizures disorder.Pseudotumor cerebri, chronic refractory headaches, debility, sp PEG and Trach now removed. MRI confirmed new CVA  9/25 admit 9/29 ICU for burst suppression 10/6 extubated  10/7 failed swallow, Cortak placed (tip duodenal bulb)  Spoke with daughter who reports that since her stroke last year pt has continued to have a gradual weight loss. Per initial nutrition assessment pt had a 24% weight loss in the last year. Pt lives with her husband and her adult daughter. Husband does the cooking. Pt sleeps late and does not get up until 12:30. She eats a muffin in the morning. She does not really eat anything else until dinner time. Husband usually prepares a meat, starch and veggie. She eats about 1/2 of her meal. She only drinks coke or gingerale, water makes her sick and has since she was a child. Per daughter pt was 160 lb last year and is now down to 130 lb.    Diet Order:   Diet Order            Diet NPO time specified  Diet effective now              EDUCATION NEEDS:   Not appropriate for education at this time  Skin:  Skin Assessment: Reviewed RN Assessment  Last BM:  10/6 - type 4  Height:   Ht Readings from Last 1 Encounters:  07/13/18 5\' 2"  (1.575 m)    Weight:   Wt Readings from Last 1 Encounters:  07/25/18 60.1 kg    Ideal Body Weight:  50 kg  BMI:  Body mass index is 24.23 kg/m.  Estimated Nutritional  Needs:   Kcal:  1600-1800  Protein:  70-85 grams  Fluid:  > 1.6 L/day  Maylon Peppers RD, LDN, CNSC [phone] Pager [phone] After Hours Pager

## 2018-07-25 NOTE — Progress Notes (Signed)
Pt. transferred to 3W-12.  Vimpat dose given to nurse responsible for patient.

## 2018-07-25 NOTE — Progress Notes (Signed)
Physical Therapy Treatment Patient Details Name: [name] MRN: [id number] DOB: 14-Aug-1951 Today's Date: 07/25/2018    History of Present Illness 67 year old woman, history of prior CVAs with carotid artery disease, hypertension, hyperlipidemia, normal pressure hydrocephalus status post shunt.  Admitted with seizures and a new left MCA territory stroke. . Had recurrent seizures and transferred to ICU 9/29 for Versed and seizure suppression.  Intubated and ventilated 9/29. Extubated 10/6.    PT Comments    Pt performed rolling on arrival to assistance nursing with pericare as patient presented with bowel incontinence.  Pt required assistance to roll hips but was able to follow commands for reaching for railing when rolling to the L side.  Pt sat edge of bed after pericare for 5 minutes.  Pt pushing posterior intially but able to come to sitting unassisted but remains kyphotic.  Continue to recommend rehab in a post acute setting to improve strength and function.     Follow Up Recommendations  CIR     Equipment Recommendations  None recommended by PT    Recommendations for Other Services Rehab consult;OT consult     Precautions / Restrictions Precautions Precautions: Fall Precaution Comments: highly fearful of falling Restrictions Weight Bearing Restrictions: No Other Position/Activity Restrictions: dtr reports pt fell a week ago and has a R ankle contusion, bruising noted    Mobility  Bed Mobility Overal bed mobility: Needs Assistance Bed Mobility: Rolling;Sidelying to Sit;Sit to Sidelying Rolling: +2 for safety/equipment;Mod assist Sidelying to sit: +2 for physical assistance;Max assist     Sit to sidelying: Total assist;+2 for physical assistance General bed mobility comments: Pt slow and guarded during session.  ON arrival soiled in bed and required multiple bouts of rolling to achieve pericare.  Pt able to follow commands to reach for railing.  Pt performed supine  to sit after pericare.  She required max assistance to achieve sitting and unmotivated to progress sitting tolerance as she reports fatigue.  Cues provided for trunk support.   Transfers Overall transfer level: (NT deferred to bed level treatment due to fatigue.  )                  Ambulation/Gait                 Stairs             Wheelchair Mobility    Modified Rankin (Stroke Patients Only)       Balance Overall balance assessment: Needs assistance   Sitting balance-Leahy Scale: Poor Sitting balance - Comments: pt with posterior pushing trying to return to supine, with cueing and education she was able to sit for brief periods of time unassisted.                                      Cognition Arousal/Alertness: Lethargic Behavior During Therapy: Flat affect Overall Cognitive Status: Difficult to assess                                        Exercises      General Comments        Pertinent Vitals/Pain Pain Assessment: Faces Pain Location: Generalized.   Pain Descriptors / Indicators: Grimacing Pain Intervention(s): Monitored during session;Repositioned;Ice applied    Home Living  Prior Function            PT Goals (current goals can now be found in the care plan section) Acute Rehab PT Goals Patient Stated Goal: didn't state Potential to Achieve Goals: Fair Progress towards PT goals: Progressing toward goals    Frequency    Min 4X/week      PT Plan Current plan remains appropriate    Co-evaluation              AM-PAC PT "6 Clicks" Daily Activity  Outcome Measure  Difficulty turning over in bed (including adjusting bedclothes, sheets and blankets)?: Unable Difficulty moving from lying on back to sitting on the side of the bed? : Unable Difficulty sitting down on and standing up from a chair with arms (e.g., wheelchair, bedside commode, etc,.)?: Unable Help  needed moving to and from a bed to chair (including a wheelchair)?: Total Help needed walking in hospital room?: Total Help needed climbing 3-5 steps with a railing? : Total 6 Click Score: 6    End of Session Equipment Utilized During Treatment: Gait belt Activity Tolerance: Patient limited by lethargy Patient left: in chair;with call bell/phone within reach;with family/visitor present Nurse Communication: Mobility status PT Visit Diagnosis: Other abnormalities of gait and mobility (R26.89);History of falling (Z91.81);Other symptoms and signs involving the nervous system (R29.898);Muscle weakness (generalized) (M62.81)     Time: 8592-9244 PT Time Calculation (min) (ACUTE ONLY): 26 min  Charges:  $Therapeutic Activity: 23-37 mins                     Governor Rooks, PTA Acute Rehabilitation Services Pager [phone] Office [phone]     [employer] [name] 07/25/2018, 5:35 PM

## 2018-07-25 NOTE — Progress Notes (Signed)
PT taken off Heliox per verbal MD order.  Placed pt on 4L [place].  Pt's Sp02 is 100 RR 15.   PT seems comfortable and not distress noted. Will continue to monitor.

## 2018-07-26 ENCOUNTER — Inpatient Hospital Stay (HOSPITAL_COMMUNITY): Payer: Medicare Other

## 2018-07-26 LAB — GLUCOSE, CAPILLARY
GLUCOSE-CAPILLARY: 165 mg/dL — AB (ref 70–99)
GLUCOSE-CAPILLARY: 185 mg/dL — AB (ref 70–99)
GLUCOSE-CAPILLARY: 235 mg/dL — AB (ref 70–99)
Glucose-Capillary: 173 mg/dL — ABNORMAL HIGH (ref 70–99)
Glucose-Capillary: 192 mg/dL — ABNORMAL HIGH (ref 70–99)
Glucose-Capillary: 212 mg/dL — ABNORMAL HIGH (ref 70–99)
Glucose-Capillary: 274 mg/dL — ABNORMAL HIGH (ref 70–99)

## 2018-07-26 LAB — BASIC METABOLIC PANEL
Anion gap: 10 (ref 5–15)
BUN: 19 mg/dL (ref 8–23)
CALCIUM: 9 mg/dL (ref 8.9–10.3)
CO2: 31 mmol/L (ref 22–32)
CREATININE: 0.89 mg/dL (ref 0.44–1.00)
Chloride: 101 mmol/L (ref 98–111)
GFR calc Af Amer: >60 mL/min (ref >60)
GFR calc non Af Amer: >60 mL/min (ref >60)
Glucose, Bld: 266 mg/dL — ABNORMAL HIGH (ref 70–99)
Potassium: 3.5 mmol/L (ref 3.5–5.1)
Sodium: 142 mmol/L (ref 135–145)

## 2018-07-26 LAB — CBC
HCT: 35.1 % — ABNORMAL LOW (ref 36.0–46.0)
Hemoglobin: 10.8 g/dL — ABNORMAL LOW (ref 12.0–15.0)
MCH: 27.8 pg (ref 26.0–34.0)
MCHC: 30.8 g/dL (ref 30.0–36.0)
MCV: 90.2 fL (ref 80.0–100.0)
PLATELETS: 379 10*3/uL (ref 150–400)
RBC: 3.89 MIL/uL (ref 3.87–5.11)
RDW: 15.2 % (ref 11.5–15.5)
WBC: 10 10*3/uL (ref 4.0–10.5)

## 2018-07-26 LAB — HEPARIN LEVEL (UNFRACTIONATED)
HEPARIN UNFRACTIONATED: 0.29 [IU]/mL — AB (ref 0.30–0.70)
HEPARIN UNFRACTIONATED: 0.25 [IU]/mL — AB (ref 0.30–0.70)
Heparin Unfractionated: 0.37 IU/mL (ref 0.30–0.70)

## 2018-07-26 LAB — MAGNESIUM: Magnesium: 2 mg/dL (ref 1.7–2.4)

## 2018-07-26 LAB — PHOSPHORUS: Phosphorus: 3.1 mg/dL (ref 2.5–4.6)

## 2018-07-26 MED ORDER — VALPROATE SODIUM 500 MG/5ML IV SOLN
1000.0000 mg | Freq: Once | INTRAVENOUS | Status: AC
  Administered 2018-07-26: 1000 mg via INTRAVENOUS
  Filled 2018-07-26: qty 10

## 2018-07-26 MED ORDER — LORAZEPAM 2 MG/ML IJ SOLN
INTRAMUSCULAR | Status: AC
  Administered 2018-07-26: 2 mg
  Filled 2018-07-26: qty 1

## 2018-07-26 MED ORDER — LORAZEPAM 2 MG/ML IJ SOLN
2.0000 mg | Freq: Once | INTRAMUSCULAR | Status: AC
  Administered 2018-07-26: 2 mg via INTRAVENOUS

## 2018-07-26 NOTE — Progress Notes (Signed)
ANTICOAGULATION CONSULT NOTE - Follow Up Consult  Pharmacy Consult for Heparin  Indication: CVA and carotid thrombus  Allergies  Allergen Reactions  . Carbamazepine Hives, Shortness Of Breath and Other (See Comments)  . Penicillins Anaphylaxis, Other (See Comments) and Swelling    Mother, father and brother have history of anaphylaxis reaction to penicillin so pt does not take  My throat swells Familial history of anaphylaxis   . Statins Other (See Comments)    Severe muscle weakness, leg numbness, severe headaches, chest pain   . Tricyclic Antidepressants Other (See Comments)    IMPAIRED MEMORY  . Atorvastatin Other (See Comments)    Severe muscle weakness, leg numbness, severe headaches, chest pain   . Ezetimibe Other (See Comments)    Severe muscle weakness, leg numbness, severe headaches, chest pain   . Nortriptyline Other (See Comments)    IMPAIRED MEMORY  . Quinapril Hcl Other (See Comments)    Unknown reaction  . Rifampin Diarrhea  . Metoclopramide Nausea And Vomiting and Rash    Patient Measurements: Height: 5\' 2"  (157.5 cm) Weight: 125 lb 3.5 oz (56.8 kg) IBW/kg (Calculated) : 50.1 Heparin Dosing Weight:  60.1 kg  Vital Signs: Temp: 98.1 F (36.7 C) (10/08 1604) Temp Source: Oral (10/08 1604) BP: 176/86 (10/08 1604)  Labs: Recent Labs    07/24/18 0347  07/24/18 2358 07/25/18 0453 07/25/18 0736  07/26/18 0353 07/26/18 1056 07/26/18 1938  HGB 8.8*  --   --   --  11.2*  --  10.8*  --   --   HCT 28.8*  --   --   --  36.1  --  35.1*  --   --   PLT 246  --   --   --  339  --  379  --   --   HEPARINUNFRC 0.11*   < >  --  0.68  --    < > 0.29* 0.25* 0.37  CREATININE 0.72  --  0.82 0.92  --   --  0.89  --   --    < > = values in this interval not displayed.    Estimated Creatinine Clearance: 49.2 mL/min (by C-G formula based on SCr of 0.89 mg/dL).  Assessment:  Anticoag: Heparin for carotid thrombus, CVA. HL 0.37 now Hgb 10.8, plts WNL, no bleeding  noted   We will confirm level in AM.  Goal of Therapy:  Heparin level 0.3-0.5 units/ml Monitor platelets by anticoagulation protocol: Yes   Plan:  Cont IV heparin 850 units/hr Recheck HL in AM  Onnie Boer, PharmD, [place], AAHIVP, CPP Infectious Disease Pharmacist Pager: [phone] 07/26/2018 8:16 PM

## 2018-07-26 NOTE — Progress Notes (Addendum)
Physical Therapy Treatment Patient Details Name: [name] MRN: [id number] DOB: 04/01/1951 Today's Date: 07/26/2018    History of Present Illness 67 year old woman, history of prior CVAs with carotid artery disease, hypertension, hyperlipidemia, normal pressure hydrocephalus status post shunt.  Admitted with seizures and a new left MCA territory stroke. . Had recurrent seizures and transferred to ICU 9/29 for Versed and seizure suppression.  Intubated and ventilated 9/29. Extubated 10/6.    PT Comments    Pt performed progression of activity as evident by standing trials and LE exercises.  Pt presents with difficulty following commands during exercises, and sequencing to stand.  Pt continues to benefit from skilled rehab in a post acute setting to improve strength and function.  Pt with noticeable DOE and require modifications of tx.     Follow Up Recommendations  CIR     Equipment Recommendations  None recommended by PT    Recommendations for Other Services Rehab consult;OT consult     Precautions / Restrictions Precautions Precautions: Fall Precaution Comments: highly fearful of falling Restrictions Weight Bearing Restrictions: No Other Position/Activity Restrictions: dtr reports pt fell a week ago and has a R ankle contusion, bruising noted    Mobility  Bed Mobility Overal bed mobility: Needs Assistance Bed Mobility: Rolling;Sidelying to Sit;Sit to Supine Rolling: +2 for safety/equipment;Mod assist Sidelying to sit: Mod assist;+2 for safety/equipment   Sit to supine: +2 for physical assistance;Max assist(Pt pushing to return to bed after standing trials.  )   General bed mobility comments: Pt able to sit edge of bed with increased participation and required decreased assistance.  Pt able to sit upright unassisted initially but as she fatigued she started to lean forward and required moderate assistance to correct with cues to hold to stedy frame.     Transfers Overall transfer level: Needs assistance Equipment used: Ambulation equipment used(sara stedy pad.  ) Transfers: Sit to/from Stand Sit to Stand: Mod assist;Max assist;+2 safety/equipment         General transfer comment: Upon standing patient presents with bowel incontinence and required pericare in standing to clean.  Pt with flexed hips and upper trunk in standing depsite cues and facilitation.  Pt tolerated brief periods of standing before she began to fatigue.    Ambulation/Gait                 Stairs             Wheelchair Mobility    Modified Rankin (Stroke Patients Only)       Balance Overall balance assessment: Needs assistance Sitting-balance support: Feet unsupported Sitting balance-Leahy Scale: Poor Sitting balance - Comments: pt with posterior pushing trying to return to supine, with cueing and education she was able to sit for brief periods of time unassisted.       Standing balance-Leahy Scale: Poor Standing balance comment: dependent on physical assist                            Cognition Arousal/Alertness: Lethargic Behavior During Therapy: Flat affect Overall Cognitive Status: Difficult to assess                         Following Commands: Follows one step commands with increased time;Follows one step commands inconsistently Safety/Judgement: Decreased awareness of safety;Decreased awareness of deficits   Problem Solving: Slow processing;Difficulty sequencing;Requires verbal cues;Requires tactile cues;Decreased initiation General Comments: Pt with increased difficulty to follow  commands for exercises.  Unable to grasp concept of isometric exercises.        Exercises General Exercises - Lower Extremity Ankle Circles/Pumps: AROM;Both;20 reps;Supine Heel Slides: AAROM;Both;10 reps;Supine;Limitations Heel Slides Limitations: difficulty following commands to extend R knee after R knee flexion. Straight Leg  Raises: AAROM;Both;10 reps;Supine;Limitations Straight Leg Raises Limitations: B extensor lag.     General Comments        Pertinent Vitals/Pain Pain Assessment: Faces Faces Pain Scale: Hurts little more Pain Location: Generalized.   Pain Descriptors / Indicators: Grimacing Pain Intervention(s): Repositioned    Home Living                      Prior Function            PT Goals (current goals can now be found in the care plan section) Acute Rehab PT Goals Patient Stated Goal: didn't state Potential to Achieve Goals: Fair Progress towards PT goals: Progressing toward goals    Frequency    Min 4X/week      PT Plan Current plan remains appropriate    Co-evaluation              AM-PAC PT "6 Clicks" Daily Activity  Outcome Measure  Difficulty turning over in bed (including adjusting bedclothes, sheets and blankets)?: Unable Difficulty moving from lying on back to sitting on the side of the bed? : Unable Difficulty sitting down on and standing up from a chair with arms (e.g., wheelchair, bedside commode, etc,.)?: Unable Help needed moving to and from a bed to chair (including a wheelchair)?: A Lot Help needed walking in hospital room?: Total Help needed climbing 3-5 steps with a railing? : Total 6 Click Score: 7    End of Session Equipment Utilized During Treatment: Gait belt Activity Tolerance: Patient limited by lethargy;Patient limited by fatigue Patient left: in chair;with call bell/phone within reach;with family/visitor present Nurse Communication: Mobility status PT Visit Diagnosis: Other abnormalities of gait and mobility (R26.89);History of falling (Z91.81);Other symptoms and signs involving the nervous system (R29.898);Muscle weakness (generalized) (M62.81)     Time: 5456-2563 PT Time Calculation (min) (ACUTE ONLY): 23 min  Charges:  $Therapeutic Activity: 23-37 mins                     Governor Rooks, PTA Acute Rehabilitation  Services Pager [phone] Office [phone]     [name] 07/26/2018, 4:31 PM

## 2018-07-26 NOTE — Progress Notes (Signed)
RT came to check on the Pt. She suggested a valve to help her to cough up the congestion because she is too weak to cough. RN has suctioned her mouth out.

## 2018-07-26 NOTE — Progress Notes (Signed)
[facility] TEAM 1 - Stepdown/ICU TEAM  [name]  LPF:[id number] DOB: 05-14-1951 DOA: 07/13/2018 PCP: [name], MD    Brief Narrative:  67 year old F w/ a history of prior CVAs with carotid artery disease, hypertension, hyperlipidemia, normal pressure hydrocephalus status post shunt who was admitted with seizures and a new left MCA territory stroke.  Treated with heparin and antiplatelets. Had recurrent seizures and transferred to ICU 9/29 for Versed and seizure suppression.  Intubated and ventilated 9/29.  Significant Events: 9/25 Admit  9/29 To ICU for burst suppression  10/6 extubated - post extubation stridor treated with racemic epi, heliox, BiPAP 10/7 Off BiPAP 10/8 TR[facility] assumed care   Subjective: Awake and conversant. Denies cp, sob, n/v, or abdom pain. Is hopeful she will pass her swallow eval today.   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  Care as per Neurology - appears to be controlled at this time  Possible myoclonus right arm Neurology adding klonopin - controlled at time of my visit today   Acute respiratory failure due to AMS and inability to protect airway Resolved w/ pt now on minimal O2 support   NPH s/p shunt placement   HTN BP remains poorly controlled - avoid overaggressive correction - gently adjust tx and follow trend   HLD Cont lopid  DM A1c 6.3 - cont SSI - CBG controlled at this time   Dysphagia  SLP following - cont cortrak feeding for now - to have MBS today   DVT prophylaxis: IV heparin  Code Status: FULL CODE Family Communication: spoke w/ daughter at bedside  Disposition Plan: SDU  Consultants:  PCCM Neurology  Antimicrobials:  None    Objective: Blood pressure (!)  166/88, pulse 80, temperature 97.8 F (36.6 C), temperature source Oral, resp. rate 15, height 5\' 2"  (1.575 m), weight 60.1 kg, SpO2 100 %.  Intake/Output Summary (Last 24 hours) at 07/26/2018 1004 Last data filed at 07/25/2018 2120 Gross per 24 hour  Intake 438.76 ml  Output 600 ml  Net -161.24 ml   Filed Weights   07/20/18 0500 07/21/18 0500 07/25/18 1400  Weight: 60.1 kg 60.1 kg 60.1 kg    Examination: General: No acute respiratory distress at rest in bed  Lungs: mild bibasilar crackles w/o wheezing  Cardiovascular: Regular rate and rhythm without murmur gallop or rub normal S1 and S2 Abdomen: Nontender, nondistended, soft, bowel sounds positive, no rebound, no ascites, no appreciable mass  Extremities: trace edema bilateral lower extremities  CBC: Recent Labs  Lab 07/24/18 0347 07/25/18 0736 07/26/18 0353  WBC 7.2 8.4 10.0  NEUTROABS 4.4 6.7  --   HGB 8.8* 11.2* 10.8*  HCT 28.8* 36.1 35.1*  MCV 90.0 89.4 90.2  PLT 246 339 532   Basic Metabolic Panel: Recent Labs  Lab 07/24/18 0347 07/24/18 2358 07/25/18 0453 07/26/18 0353  NA 140 143 141 142  K 3.6 3.2* 3.8 3.5  CL 106 98 97* 101  CO2 26 32 27 31  GLUCOSE 160* 111* 160* 266*  BUN 10 11 15 19   CREATININE 0.72 0.82 0.92 0.89  CALCIUM 8.9 10.1 9.8 9.0  MG 1.8  --  1.8 2.0  PHOS 3.2  --  5.9* 3.1   GFR: Estimated Creatinine Clearance: 49.2 mL/min (  by C-G formula based on SCr of 0.89 mg/dL).  Liver Function Tests: No results for input(s): AST, ALT, ALKPHOS, BILITOT, PROT, ALBUMIN in the last 168 hours. No results for input(s): LIPASE, AMYLASE in the last 168 hours. No results for input(s): AMMONIA in the last 168 hours.  HbA1C: Hgb A1c MFr Bld  Date/Time Value Ref Range Status  07/13/2018 11:53 PM 6.3 (H) 4.8 - 5.6 % Final    Comment:    (NOTE) Pre diabetes:          5.7%-6.4% Diabetes:              >6.4% Glycemic control for   <7.0% adults with diabetes   06/02/2017 07:41 AM 9.5 (H) 4.8 - 5.6 %  Final    Comment:    (NOTE)         Prediabetes: 5.7 - 6.4         Diabetes: >6.4         Glycemic control for adults with diabetes: <7.0     CBG: Recent Labs  Lab 07/25/18 1605 07/25/18 1938 07/25/18 2359 07/26/18 0439 07/26/18 0755  GLUCAP 136* 205* 173* 235* 192*    Recent Results (from the past 240 hour(s))  MRSA PCR Screening     Status: None   Collection Time: 07/17/18  3:01 PM  Result Value Ref Range Status   MRSA by PCR NEGATIVE NEGATIVE Final    Comment:        The GeneXpert MRSA Assay (FDA approved for NASAL specimens only), is one component of a comprehensive MRSA colonization surveillance program. It is not intended to diagnose MRSA infection nor to guide or monitor treatment for MRSA infections. Performed at [facility], [place] [address], [place], [place] [address]      Scheduled Meds: . chlorhexidine  15 mL Mouth Rinse BID  . gabapentin  300 mg Oral TID  . gemfibrozil  600 mg Oral BID AC  . Influenza vac split quadrivalent PF  0.5 mL Intramuscular Tomorrow-1000  . insulin aspart  0-9 Units Subcutaneous Q4H  . mouth rinse  15 mL Mouth Rinse q12n4p  . metoprolol tartrate  25 mg Oral BID   Continuous Infusions: . sodium chloride Stopped (07/23/18 0958)  . feeding supplement (JEVITY 1.2 CAL) 60 mL/hr at 07/25/18 2000  . heparin 750 Units/hr (07/25/18 2000)  . lacosamide (VIMPAT) IV 200 mg (07/25/18 2117)  . levETIRAcetam 1,000 mg (07/25/18 2339)     LOS: 13 days   Cherene Altes, MD [facility] Office  [phone] Pager - Text Page per Amion  If 7PM-7AM, please contact night-coverage per Amion 07/26/2018, 10:04 AM

## 2018-07-26 NOTE — Progress Notes (Signed)
ANTICOAGULATION CONSULT NOTE - Follow Up Consult  Pharmacy Consult for Heparin Indication: stroke  Patient Measurements: Height: 5\' 2"  (157.5 cm) Weight: 132 lb 7.9 oz (60.1 kg) IBW/kg (Calculated) : 50.1 Heparin Dosing Weight:  59 kg  Vital Signs: Temp: 98.1 F (36.7 C) (10/08 0322) Temp Source: Oral (10/08 0322) BP: 148/94 (10/08 0322) Pulse Rate: 80 (10/08 0322)  Labs: Recent Labs    07/24/18 0347  07/24/18 2358 07/25/18 0453 07/25/18 0736 07/25/18 1816 07/26/18 0353  HGB 8.8*  --   --   --  11.2*  --   --   HCT 28.8*  --   --   --  36.1  --   --   PLT 246  --   --   --  339  --   --   HEPARINUNFRC 0.11*   < >  --  0.68  --  0.37 0.29*  CREATININE 0.72  --  0.82 0.92  --   --   --    < > = values in this interval not displayed.    Estimated Creatinine Clearance: 47.6 mL/min (by C-G formula based on SCr of 0.92 mg/dL).  Assessment: [name] continues on IV heparin for carotid thrombus and CVA. Heparin level slightly subtherapeutic (0.29) on gtt at 750 units/hr. RN reports that due to position of heparin line, it keeps beeping so she has to keep restarting it.    Goal of Therapy:  Heparin level 0.3-0.5 units/ml Monitor platelets by anticoagulation protocol: Yes   Plan:  Continue heparin 750 units/hr Will f/u 6 hr heparin level  Sherlon Handing, PharmD, BCPS Clinical pharmacist  **Pharmacist phone directory can now be found on [email or web address] ([email or web address]).  Listed under [place]. 07/26/2018 4:45 AM

## 2018-07-26 NOTE — Progress Notes (Signed)
Neurology Progress Note   S:// Patient seen and examined. Was transferred to the floor overnight. Continues to have some intermittent right arm twitching Has been awake and interactive with the family  O:// Current vital signs: BP (!) 166/88 (BP Location: Right Arm)   Pulse 80   Temp 97.8 F (36.6 C) (Oral)   Resp 15   Ht 5\' 2"  (1.575 m)   Wt 60.1 kg   SpO2 100%   BMI 24.23 kg/m  Vital signs in last 24 hours: Temp:  [97.8 F (36.6 C)-98.2 F (36.8 C)] 97.8 F (36.6 C) (10/08 0757) Pulse Rate:  [78-104] 80 (10/08 0322) Resp:  [15-23] 15 (10/08 0322) BP: (113-178)/(83-121) 166/88 (10/08 0757) SpO2:  [98 %-100 %] 100 % (10/08 0322) Weight:  [60.1 kg] 60.1 kg (10/07 1400) Essentially unchanged exam since yesterday GENERAL: Well-developed well-nourished, awake in no distress HEENT: - Normocephalic and atraumatic, dry mm, no LN++, no Thyromegally LUNGS - Clear to auscultation bilaterally with no wheezes CV - S1S2 RRR, no m/r/g, equal pulses bilaterally. ABDOMEN - Soft, nontender, nondistended with normoactive BS Ext: warm, well perfused, intact peripheral pulses, no edema NEURO:  Mental Status: Awake alert oriented to self and place.  Not oriented to time.  Able to answer questions appropriately with yes and no.  Unable to tell me the year of her birth or her age correctly Language: speech is mildly dysarthric and hypophonic.  Poor attention concentration at this time.  Unable to participate in testing naming and repetition.  Able to comprehend simple commands consistently but unable to follow multistep commands. Cranial Nerves: PERRL . EOMI, visual fields full, no facial asymmetry Motor: Able to raise both arms up against gravity but is weaker on the right upper extremity 4/5 with right grip strength barely 2/5.  Able to lift both legs against gravity with mild paresis of the right lower extremity-4/5. Tone: is normal and bulk is normal Sensation- Intact to light touch  bilaterally Coordination: FTN intact bilaterally Gait- deferred DTRs-brisk throughout the right side   Medications  Current Facility-Administered Medications:  .  0.9 %  sodium chloride infusion, , Intravenous, Continuous, Roxan Hockey, MD, Stopped at 07/23/18 2458 .  acetaminophen (TYLENOL) tablet 650 mg, 650 mg, Oral, Q4H PRN, 650 mg at 07/16/18 2056 **OR** acetaminophen (TYLENOL) solution 650 mg, 650 mg, Per Tube, Q4H PRN, 650 mg at 07/26/18 0017 **OR** acetaminophen (TYLENOL) suppository 650 mg, 650 mg, Rectal, Q4H PRN, Doutova, Anastassia, MD, 650 mg at 07/24/18 1225 .  chlorhexidine (PERIDEX) 0.12 % solution 15 mL, 15 mL, Mouth Rinse, BID, Agarwala, Ravi, MD, 15 mL at 07/25/18 2253 .  feeding supplement (JEVITY 1.2 CAL) liquid 1,000 mL, 1,000 mL, Per Tube, Continuous, Agarwala, Ravi, MD, Last Rate: 60 mL/hr at 07/25/18 2000 .  gabapentin (NEURONTIN) capsule 300 mg, 300 mg, Oral, TID, Minor, Grace Bushy, NP, 300 mg at 07/25/18 2254 .  gemfibrozil (LOPID) tablet 600 mg, 600 mg, Oral, BID AC, Emokpae, Courage, MD, 600 mg at 07/25/18 1648 .  heparin ADULT infusion 100 units/mL (25000 units/244mL sodium chloride 0.45%), 750 Units/hr, Intravenous, Continuous, Corinda Gubler, RPH, Last Rate: 7.5 mL/hr at 07/25/18 2000, 750 Units/hr at 07/25/18 2000 .  Influenza vac split quadrivalent PF (FLUZONE HIGH-DOSE) injection 0.5 mL, 0.5 mL, Intramuscular, Tomorrow-1000, Deterding, Elizabeth C, MD .  insulin aspart (novoLOG) injection 0-9 Units, 0-9 Units, Subcutaneous, Q4H, Doutova, Anastassia, MD, 3 Units at 07/26/18 0527 .  labetalol (NORMODYNE,TRANDATE) injection 10 mg, 10 mg, Intravenous, Q2H PRN, Rollene Fare  M, MD, 10 mg at 07/25/18 1018 .  lacosamide (VIMPAT) 200 mg in sodium chloride 0.9 % 25 mL IVPB, 200 mg, Intravenous, Q12H, Greta Doom, MD, Last Rate: 90 mL/hr at 07/25/18 2117, 200 mg at 07/25/18 2117 .  levETIRAcetam (KEPPRA) IVPB 1000 mg/100 mL premix, 1,000 mg,  Intravenous, Q12H, Kerney Elbe, MD, Last Rate: 400 mL/hr at 07/25/18 2339, 1,000 mg at 07/25/18 2339 .  MEDLINE mouth rinse, 15 mL, Mouth Rinse, q12n4p, Agarwala, Ravi, MD, 15 mL at 07/25/18 1636 .  metoprolol tartrate (LOPRESSOR) tablet 25 mg, 25 mg, Oral, BID, Minor, Grace Bushy, NP, 25 mg at 07/25/18 2253 .  ondansetron (ZOFRAN) injection 4 mg, 4 mg, Intravenous, Q6H PRN, Bodenheimer, Charles A, NP, 4 mg at 07/26/18 0017 Labs CBC    Component Value Date/Time   WBC 10.0 07/26/2018 0353   RBC 3.89 07/26/2018 0353   HGB 10.8 (L) 07/26/2018 0353   HCT 35.1 (L) 07/26/2018 0353   PLT 379 07/26/2018 0353   MCV 90.2 07/26/2018 0353   MCH 27.8 07/26/2018 0353   MCHC 30.8 07/26/2018 0353   RDW 15.2 07/26/2018 0353   LYMPHSABS 1.2 07/25/2018 0736   MONOABS 0.4 07/25/2018 0736   EOSABS 0.0 07/25/2018 0736   BASOSABS 0.0 07/25/2018 0736    CMP     Component Value Date/Time   NA 142 07/26/2018 0353   NA 137 01/15/2016 1632   K 3.5 07/26/2018 0353   CL 101 07/26/2018 0353   CO2 31 07/26/2018 0353   GLUCOSE 266 (H) 07/26/2018 0353   BUN 19 07/26/2018 0353   BUN 14 01/15/2016 1632   CREATININE 0.89 07/26/2018 0353   CALCIUM 9.0 07/26/2018 0353   PROT 5.1 (L) 07/18/2018 0235   ALBUMIN 2.6 (L) 07/18/2018 0235   AST 19 07/18/2018 0235   ALT 13 07/18/2018 0235   ALKPHOS 80 07/18/2018 0235   BILITOT 0.5 07/18/2018 0235   GFRNONAA >60 07/26/2018 0353   GFRAA >60 07/26/2018 0353    Assessment:  67 year old woman with recent scattered embolic strokes in watershed pattern in the left cerebral hemisphere with a left 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  She continues to have some twitching of the right upper extremity intermittently likely subcortical myoclonus versus hyperreflexia from her stroke. She was  started on gabapentin in addition to her Keppra and Vimpat a couple days ago and has been stable on it with intermittent right upper extremity twitching. I would not change this further with any more aggressive antiepileptics but will recommend adding Klonopin at low doses as this can help supporting her myoclonus. If she is cleared for swallow by a formal swallow evaluation, and does not require PEG tube placement, she can come off the heparin and be started on Eliquis.  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 twice daily and if patient improves, can increase to a total of 1 mg twice daily gradually as needed. -Can change heparin to Eliquis once it is clear that she does not require any surgical procedure such  as PEG tube placement in the near future.  Most recent speech-language pathology evaluation recommends n.p.o. and still not cleared for by mouth intake.  I have communicated my plan over the phone to Dr. Thereasa Solo, primary hospitalist with the patient.  -- Amie Portland, MD [facility] Pager: [phone] If 7pm to 7am, please call on call as listed on AMION.

## 2018-07-26 NOTE — Progress Notes (Signed)
Modified Barium Swallow Progress Note  Patient Details  Name: [name] MRN: [id number] Date of Birth: 06-04-1951  Today's Date: 07/26/2018  Modified Barium Swallow completed.  Full report located under Chart Review in the Imaging Section.  Brief recommendations include the following:  Clinical Impression  Pt has a mild oral and moderate pharyngeal dysphagia impacted largely by motor deficits/reduced strength. Lingual manipulation is mildly weak, resulting in decreased cohesion and longer oral transit times especially with purees. Pt also has reduced hyolaryngeal movement, epiglottic inversion, and airway closure, allowing nectar thick and honey thick liquid boluses to enter the laryngeal vestibule even when given small amounts. Also question if they are mixing with secretions given audible wetness. She appears to have either a delayed cough response and/or is coughing on secretions in between boluses, but her cough is not productive enough to clear her airway well. The weight of pureed boluses helps to mildly increase epiglottic inversion, enough so that she protects her airway well across all boluses of this textures. Recommend continuing alternative means of nutrition but allowing snacks of purees from the floor stock in order to increase use of the hyolaryngeam musculature. Pt may also beenfit from RMT to assist swallow and cough. Will follow for readiness to start diet versus need for repeat testing as overall alertness and endurance begin to improve.   Swallow Evaluation Recommendations       SLP Diet Recommendations: Alternative means - temporary;Other (Comment)(pureed snacks from floor stock (not mean trays))       Medication Administration: Via alternative means               Oral Care Recommendations: Oral care QID        [name] 07/26/2018,2:10 PM   [name], M.A. [place] Acute Environmental education officer [phone] Office  [phone]

## 2018-07-26 NOTE — Progress Notes (Signed)
Patient declined NIV at this time and stated she does not wear anything to sleep at home, and did not wish to wear NIV at anytime tonight. No distress noted at this time.

## 2018-07-26 NOTE — Progress Notes (Signed)
ANTICOAGULATION CONSULT NOTE - Follow Up Consult  Pharmacy Consult for Heparin  Indication: CVA and carotid thrombus  Allergies  Allergen Reactions  . Carbamazepine Hives, Shortness Of Breath and Other (See Comments)  . Penicillins Anaphylaxis, Other (See Comments) and Swelling    Mother, father and brother have history of anaphylaxis reaction to penicillin so pt does not take  My throat swells Familial history of anaphylaxis   . Statins Other (See Comments)    Severe muscle weakness, leg numbness, severe headaches, chest pain   . Tricyclic Antidepressants Other (See Comments)    IMPAIRED MEMORY  . Atorvastatin Other (See Comments)    Severe muscle weakness, leg numbness, severe headaches, chest pain   . Ezetimibe Other (See Comments)    Severe muscle weakness, leg numbness, severe headaches, chest pain   . Nortriptyline Other (See Comments)    IMPAIRED MEMORY  . Quinapril Hcl Other (See Comments)    Unknown reaction  . Rifampin Diarrhea  . Metoclopramide Nausea And Vomiting and Rash    Patient Measurements: Height: 5\' 2"  (157.5 cm) Weight: 132 lb 7.9 oz (60.1 kg) IBW/kg (Calculated) : 50.1 Heparin Dosing Weight:  60.1 kg  Vital Signs: Temp: 98 F (36.7 C) (10/08 1122) Temp Source: Oral (10/08 1122) BP: 157/92 (10/08 1122) Pulse Rate: 80 (10/08 0322)  Labs: Recent Labs    07/24/18 0347  07/24/18 2358 07/25/18 0453 07/25/18 0736 07/25/18 1816 07/26/18 0353 07/26/18 1056  HGB 8.8*  --   --   --  11.2*  --  10.8*  --   HCT 28.8*  --   --   --  36.1  --  35.1*  --   PLT 246  --   --   --  339  --  379  --   HEPARINUNFRC 0.11*   < >  --  0.68  --  0.37 0.29* 0.25*  CREATININE 0.72  --  0.82 0.92  --   --  0.89  --    < > = values in this interval not displayed.    Estimated Creatinine Clearance: 49.2 mL/min (by C-G formula based on SCr of 0.89 mg/dL).  Assessment:  Anticoag: Heparin for carotid thrombus, CVA. HL 0.29>0.25 Hgb 10.8, plts WNL, no bleeding  noted  - 10/8: 3rd shift RN reports that due to position of heparin line, it keeps beeping so she has to keep restarting it. 1st shift still reporting same problem with line  Goal of Therapy:  Heparin level 0.3-0.5 units/ml Monitor platelets by anticoagulation protocol: Yes   Plan:  Increase IV heparin 850 units/hr Recheck HL in 6 hrs.  Kevonna Nolte S. Alford Highland, PharmD, BCPS Clinical Staff Pharmacist Pager [phone]  [name] 07/26/2018,12:45 PM

## 2018-07-26 NOTE — Progress Notes (Signed)
  Speech Language Pathology Treatment: Dysphagia  Patient Details Name: [name] MRN: [id number] DOB: 04/12/1951 Today's Date: 07/26/2018 Time: 3491-7915 SLP Time Calculation (min) (ACUTE ONLY): 21 min  Assessment / Plan / Recommendation Clinical Impression  Pt remains weak, drowsy, but continues to show small improvements in alertness, sustained attention, and strength of cough/voice. She has been asking for something to drink this morning. Today she had more of an active role in oral care, having difficulty holding the suction with her hand but instead making attempts to move her head to clean different parts of her mouth. Oral acceptance and manipulation is sluggish but functional with minimal cueing. No overt signs of aspiration were observed with ice chips and small amounts of water (pt would not take larger boluses as she does not like water), but when prompted to cough it was productive. Discussed with pt/daughter: I am still concerned about dysphagia post-extubation, with likely reduced ability to tolerate aspiration given reduced functional reserve. Recommend proceeding with MBS to better assess oropharyngeal function to determine if any boluses can be started, at least therapeutically. Pt/daughter are in agreement - test is scheduled for later today.   HPI HPI: Pt is a 67 y.o. female with medical history significant of DM 2, CVA with right sided deficits and dysphagia requiring PEG tube, anemia, GERD, HLD, HTN seizures disorder, chronic refractory headaches, debility, sp PEG and Trach now removed. Admitted with decreased responsiveness. MRI showed acute and subacute infarcts in the left hemisphere in the left MCA distribution raising possibility of embolic source. Note that based on 05/2018 OP SLP therapy documentation, patient tolerating a regular diet, thin liquids. BSE completed 9/26 this admission recomending NPO pending MBS due to inconsistent s/s of aspiration. Pt declined MBS and  was started on a trial of full liquids, but then developed seizure activity and was intubated 9/29. Extubated 10/6.      SLP Plan  MBS       Recommendations  Diet recommendations: NPO Medication Administration: Via alternative means                Oral Care Recommendations: Oral care QID Follow up Recommendations: Inpatient Rehab SLP Visit Diagnosis: Dysphagia, unspecified (R13.10);Cognitive communication deficit [phone]) Plan: MBS       GO                [name] 07/26/2018, 9:22 AM  [name], M.A. [place] Acute Environmental education officer [phone] Office [phone]

## 2018-07-27 ENCOUNTER — Inpatient Hospital Stay (HOSPITAL_COMMUNITY): Payer: Medicare Other

## 2018-07-27 LAB — GLUCOSE, CAPILLARY
GLUCOSE-CAPILLARY: 131 mg/dL — AB (ref 70–99)
GLUCOSE-CAPILLARY: 146 mg/dL — AB (ref 70–99)
GLUCOSE-CAPILLARY: 174 mg/dL — AB (ref 70–99)
GLUCOSE-CAPILLARY: 209 mg/dL — AB (ref 70–99)
Glucose-Capillary: 132 mg/dL — ABNORMAL HIGH (ref 70–99)
Glucose-Capillary: 170 mg/dL — ABNORMAL HIGH (ref 70–99)

## 2018-07-27 LAB — COMPREHENSIVE METABOLIC PANEL
ALT: 16 U/L (ref 0–44)
ANION GAP: 11 (ref 5–15)
AST: 19 U/L (ref 15–41)
Albumin: 2.8 g/dL — ABNORMAL LOW (ref 3.5–5.0)
Alkaline Phosphatase: 72 U/L (ref 38–126)
BILIRUBIN TOTAL: 0.4 mg/dL (ref 0.3–1.2)
BUN: 15 mg/dL (ref 8–23)
CO2: 26 mmol/L (ref 22–32)
Calcium: 8.7 mg/dL — ABNORMAL LOW (ref 8.9–10.3)
Chloride: 102 mmol/L (ref 98–111)
Creatinine, Ser: 0.72 mg/dL (ref 0.44–1.00)
Glucose, Bld: 99 mg/dL (ref 70–99)
POTASSIUM: 3.7 mmol/L (ref 3.5–5.1)
Sodium: 139 mmol/L (ref 135–145)
TOTAL PROTEIN: 6.3 g/dL — AB (ref 6.5–8.1)

## 2018-07-27 LAB — CBC
HEMATOCRIT: 32.4 % — AB (ref 36.0–46.0)
HEMOGLOBIN: 9.6 g/dL — AB (ref 12.0–15.0)
MCH: 26.9 pg (ref 26.0–34.0)
MCHC: 29.6 g/dL — AB (ref 30.0–36.0)
MCV: 90.8 fL (ref 80.0–100.0)
NRBC: 0 % (ref 0.0–0.2)
Platelets: 395 10*3/uL (ref 150–400)
RBC: 3.57 MIL/uL — AB (ref 3.87–5.11)
RDW: 15.3 % (ref 11.5–15.5)
WBC: 12 10*3/uL — AB (ref 4.0–10.5)

## 2018-07-27 LAB — AMMONIA: AMMONIA: 34 umol/L (ref 9–35)

## 2018-07-27 LAB — HEPARIN LEVEL (UNFRACTIONATED): HEPARIN UNFRACTIONATED: 0.27 [IU]/mL — AB (ref 0.30–0.70)

## 2018-07-27 LAB — VALPROIC ACID LEVEL: VALPROIC ACID LVL: 32 ug/mL — AB (ref 50.0–100.0)

## 2018-07-27 MED ORDER — LEVETIRACETAM IN NACL 1500 MG/100ML IV SOLN
1500.0000 mg | Freq: Two times a day (BID) | INTRAVENOUS | Status: DC
  Administered 2018-07-27 – 2018-07-29 (×4): 1500 mg via INTRAVENOUS
  Filled 2018-07-27 (×5): qty 100

## 2018-07-27 MED ORDER — LEVETIRACETAM IN NACL 1000 MG/100ML IV SOLN
1000.0000 mg | INTRAVENOUS | Status: AC
  Administered 2018-07-27: 1000 mg via INTRAVENOUS
  Filled 2018-07-27: qty 100

## 2018-07-27 MED ORDER — HEPARIN (PORCINE) IN NACL 100-0.45 UNIT/ML-% IJ SOLN
750.0000 [IU]/h | INTRAMUSCULAR | Status: DC
  Administered 2018-07-27 – 2018-07-31 (×6): 950 [IU]/h via INTRAVENOUS
  Administered 2018-08-02: 650 [IU]/h via INTRAVENOUS
  Administered 2018-08-03: 800 [IU]/h via INTRAVENOUS
  Filled 2018-07-27 (×7): qty 250

## 2018-07-27 MED ORDER — LORAZEPAM 2 MG/ML IJ SOLN: 1.0000 mg | Freq: Once | INTRAMUSCULAR | Status: DC

## 2018-07-27 MED ORDER — VALPROATE SODIUM 500 MG/5ML IV SOLN
500.0000 mg | Freq: Two times a day (BID) | INTRAVENOUS | Status: DC
  Filled 2018-07-27: qty 5

## 2018-07-27 MED ORDER — LORAZEPAM 2 MG/ML IJ SOLN
INTRAMUSCULAR | Status: AC
  Filled 2018-07-27: qty 1

## 2018-07-27 MED ORDER — APIXABAN 5 MG PO TABS: 5.0000 mg | ORAL_TABLET | Freq: Two times a day (BID) | ORAL | Status: DC

## 2018-07-27 NOTE — Progress Notes (Signed)
I was called around 11 pm the patient was having a seizure.   On assessment patient had rhythmic jerking of right upper extremity and right lower extremity.  Patient was awake, drowsy but following commands.   Milligrams of Ativan was given which seemed to reduce the intensity of jerking.  Ordered valproic acid 1 g IV load. Called nurse approximately 1 hour after receiving valproic acid and told patient was asleep and no longer having jerking movements.  Checked again on the patient around 4:00 AM, patient having occasional twitches in the right arm, much improved.  Recommendations Repeat EEG in the morning Start VPA 500 mg twice daily Continue remaining AEDs Obtain valproic acid level, if low may increase dose

## 2018-07-27 NOTE — Progress Notes (Signed)
  Speech Language Pathology Treatment: Dysphagia  Patient Details Name: [name] MRN: [id number] DOB: October 16, 1951 Today's Date: 07/27/2018 Time: 8891-6945 SLP Time Calculation (min) (ACUTE ONLY): 19 min  Assessment / Plan / Recommendation Clinical Impression  Pt presents with increased drowsiness with decreased alertness and ability to sustain attention this date despite max verbal cues. Note audible wetness with respirations; with max verbal cues Pt did demonstrate one moderately strong yet non productive cough  however SLP only retrieved a small amount of frothy, thin secretions via yankauer resulting in mildly decreased audible wetness. Following oral care pt did demonstrate a reflexive swallow and pt also triggered a swallow after a small amount of water was provided via swab in order to thin secretions for better clearance. Pt demonstrated some verbalizations that were ~25% intelligible with continued hoarse and weak vocal quality. Secondary to decreased alertness and lack of ability to attend pt was not provided PO trials this date. Continue to recommend permit puree snacks when Pt is alert and requesting PO. ST will continue to follow  HPI HPI: Pt is a 67 y.o. female with medical history significant of DM 2, CVA with right sided deficits and dysphagia requiring PEG tube, anemia, GERD, HLD, HTN seizures disorder, chronic refractory headaches, debility, sp PEG and Trach now removed. Admitted with decreased responsiveness. MRI showed acute and subacute infarcts in the left hemisphere in the left MCA distribution raising possibility of embolic source. Note that based on 05/2018 OP SLP therapy documentation, patient tolerating a regular diet, thin liquids. BSE completed 9/26 this admission recomending NPO pending MBS due to inconsistent s/s of aspiration. Pt declined MBS and was started on a trial of full liquids, but then developed seizure activity and was intubated 9/29. Extubated 10/6. MBS 10/8  recommending continue alternative means of nutrition but allowing snacks of purees to increase use of the hyolaryngeam musculature      SLP Plan  Continue with current plan of care       Recommendations  Diet recommendations: NPO(permit puree snacks when pt is alert) Medication Administration: Via alternative means Supervision: Full supervision/cueing for compensatory strategies;Staff to assist with self feeding Compensations: Minimize environmental distractions;Slow rate;Small sips/bites Postural Changes and/or Swallow Maneuvers: Seated upright 90 degrees                Oral Care Recommendations: Oral care QID Follow up Recommendations: Inpatient Rehab SLP Visit Diagnosis: Dysphagia, oropharyngeal phase (R13.12) Plan: Continue with current plan of care       GO                Wende Bushy 07/27/2018, 10:26 AM

## 2018-07-27 NOTE — Progress Notes (Signed)
ANTICOAGULATION CONSULT NOTE - Follow Up Consult  Pharmacy Consult for Heparin  Indication: CVA and carotid thrombus  Allergies  Allergen Reactions  . Carbamazepine Hives, Shortness Of Breath and Other (See Comments)  . Penicillins Anaphylaxis, Other (See Comments) and Swelling    Mother, father and brother have history of anaphylaxis reaction to penicillin so pt does not take  My throat swells Familial history of anaphylaxis   . Statins Other (See Comments)    Severe muscle weakness, leg numbness, severe headaches, chest pain   . Tricyclic Antidepressants Other (See Comments)    IMPAIRED MEMORY  . Atorvastatin Other (See Comments)    Severe muscle weakness, leg numbness, severe headaches, chest pain   . Ezetimibe Other (See Comments)    Severe muscle weakness, leg numbness, severe headaches, chest pain   . Nortriptyline Other (See Comments)    IMPAIRED MEMORY  . Quinapril Hcl Other (See Comments)    Unknown reaction  . Rifampin Diarrhea  . Metoclopramide Nausea And Vomiting and Rash    Patient Measurements: Height: 5\' 2"  (157.5 cm) Weight: 125 lb 3.5 oz (56.8 kg) IBW/kg (Calculated) : 50.1 Heparin Dosing Weight:  60.1 kg  Vital Signs: Temp: 99.4 F (37.4 C) (10/09 0737) Temp Source: Axillary (10/09 0737) BP: 156/87 (10/09 0737) Pulse Rate: 88 (10/09 0327)  Labs: Recent Labs    07/25/18 0453  07/25/18 0736  07/26/18 0353 07/26/18 1056 07/26/18 1938 07/27/18 0548  HGB  --    < > 11.2*  --  10.8*  --   --  9.6*  HCT  --   --  36.1  --  35.1*  --   --  32.4*  PLT  --   --  339  --  379  --   --  395  HEPARINUNFRC 0.68  --   --    < > 0.29* 0.25* 0.37 0.27*  CREATININE 0.92  --   --   --  0.89  --   --  0.72   < > = values in this interval not displayed.    Estimated Creatinine Clearance: 54.7 mL/min (by C-G formula based on SCr of 0.72 mg/dL).  Assessment:  Anticoag: Heparin for carotid thrombus, CVA.  CBC stable Heparin level low at 0.27   Goal of  Therapy:  Heparin level 0.3-0.5 units/ml Monitor platelets by anticoagulation protocol: Yes   Plan:  Increase heparin to 950 units / hr Daily heparin level, CBC  Thank you Anette Guarneri, PharmD [phone] 07/27/2018 10:08 AM

## 2018-07-27 NOTE — Progress Notes (Signed)
Called to room secondary to right arm and leg twitching.  Upon entering room patient was having rhythmic right face, right arm, right leg twitching and was clearly not as coherent as usual per daughter.  This lasted for approximately 10 minutes.  A order was made for 1000 mg Keppra stat, and 1 mg of Ativan.  By the time the Ativan had arrived to the room patient had almost resolved completely and was alert and able to follow my commands, tell me what finger him holding up and where she was.  Thus Ativan was held.  Patient will still receive 1 g of Keppra.  Patient's Keppra has been increased to 1500 mg starting tonight and to be given twice daily afterwards.  Nurse has been again directed to call us if she notes any difference or seizure activity.  Continue seizure precautions.  We will hold off on EEG at this point as this was a clinical seizure and patient has resolved.  We will continue to follow.  Discussed with my attending.  Etta Quill PA-C [facility] [phone]  M-F  (9:00 am- 5:00 PM)  07/27/2018, 3:38 PM

## 2018-07-27 NOTE — Progress Notes (Signed)
Right before this RN begun report, previous RN was in room with patient as she was having a seizure. Neuro was called to bedside and this RN assisted in giving ativan to patient and reassessing her. MD asked that Depacon be hung as soon as possible and to monitor patient. Patient has since been resting and MD is aware. Will continue to monitor

## 2018-07-27 NOTE — Progress Notes (Signed)
Welcome TEAM 1 - Stepdown/ICU TEAM  [name]  PRF:[id number] DOB: 12/17/1950 DOA: 07/13/2018 PCP: [name], MD    Brief Narrative:  67 year old F w/ a history of prior CVAs with carotid artery disease, hypertension, hyperlipidemia, normal pressure hydrocephalus status post shunt who was admitted with seizures and a new left MCA territory stroke.  Treated with heparin and antiplatelets. Had recurrent seizures and transferred to ICU 9/29 for Versed and seizure suppression.  Intubated and ventilated 9/29.   Significant Events: 9/25 Admit  9/29 To ICU for burst suppression  10/6 extubated - post extubation stridor treated with racemic epi, heliox, BiPAP 10/7 Off BiPAP 10/8 TRH assumed care   Subjective: Patient had rhythmic jerking of right upper arm and right lower extremity overnight.  Neurology was called and gave Ativan and loading dose of valproic acid.  Patient is sent home drowsy this morning.  Oriented to self and place but not time.  Somewhat tachycardic.  She is on oxygen.  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 -Continue heparin now.  May transition to Eliquis if she doesn't need procedure for them. -Discontinue Foley.  Partial seizures originating from the left hemisphere with status epilepticus: Status epilepticus resolved but patient with intermittent twitching of right extremities.  EEG did not show seizure activity. Care as per Neurology -(continue Keppra, Vimpat, Neurontin and Klonopin)  Possible myoclonus right arm Neurology following  Acute respiratory failure due to AMS and inability to protect airway Resolved w/ pt now on minimal O2 support   NPH s/p shunt placement   HTN BP remains poorly  controlled - avoid overaggressive correction - gently adjust tx and follow trend   HLD Cont lopid  DM A1c 6.3 - cont SSI - CBG controlled at this time   Dysphagia  SLP following - cont cortrak feeding for now - to have MBS today   DVT prophylaxis: IV heparin  Code Status: FULL CODE Family Communication: No family member at bedside Disposition Plan: Remains in SDU.  High risk for deconditioning  Consultants:  PCCM Neurology  Antimicrobials:  None    Objective: Blood pressure (!) 156/87, pulse 88, temperature 99.4 F (37.4 C), temperature source Axillary, resp. rate (!) 22, height 5\' 2"  (1.575 m), weight 56.8 kg, SpO2 100 %.  Intake/Output Summary (Last 24 hours) at 07/27/2018 1551 Last data filed at 07/27/2018 0400 Gross per 24 hour  Intake -  Output 850 ml  Net -850 ml   Filed Weights   07/25/18 1400 07/26/18 1604 07/27/18 0356  Weight: 60.1 kg 56.8 kg 56.8 kg    Examination: GENERAL: On nasal cannula.  Appears drowsy.  Oriented to self and place but not time HEENT: PERRLA, MMM NECK: Supple LUNGS:  No IWOB, good air movement, CTAB HEART:  RRR with no M/R/G ABD:  Morbidly obese, soft, NT with active BS EXT:  No C/E/E NEURO: Drowsy but arises response to question.  Oriented to self and place but not time.  Cranial is grossly intact.  Notable weakness in right upper lower extremity PSYCH: Calm.  Drowsy.  CBC: Recent Labs  Lab 07/24/18 0347 07/25/18 0736 07/26/18 0353 07/27/18 0548  WBC 7.2 8.4 10.0 12.0*  NEUTROABS 4.4 6.7  --   --   HGB 8.8*  11.2* 10.8* 9.6*  HCT 28.8* 36.1 35.1* 32.4*  MCV 90.0 89.4 90.2 90.8  PLT 246 339 379 357   Basic Metabolic Panel: Recent Labs  Lab 07/24/18 0347  07/25/18 0453 07/26/18 0353 07/27/18 0548  NA 140   < > 141 142 139  K 3.6   < > 3.8 3.5 3.7  CL 106   < > 97* 101 102  CO2 26   < > 27 31 26   GLUCOSE 160*   < > 160* 266* 99  BUN 10   < > 15 19 15   CREATININE 0.72   < > 0.92 0.89 0.72  CALCIUM 8.9   < > 9.8  9.0 8.7*  MG 1.8  --  1.8 2.0  --   PHOS 3.2  --  5.9* 3.1  --    < > = values in this interval not displayed.   GFR: Estimated Creatinine Clearance: 54.7 mL/min (by C-G formula based on SCr of 0.72 mg/dL).  Liver Function Tests: Recent Labs  Lab 07/27/18 0548  AST 19  ALT 16  ALKPHOS 72  BILITOT 0.4  PROT 6.3*  ALBUMIN 2.8*   No results for input(s): LIPASE, AMYLASE in the last 168 hours. Recent Labs  Lab 07/27/18 0912  AMMONIA 34    HbA1C: Hgb A1c MFr Bld  Date/Time Value Ref Range Status  07/13/2018 11:53 PM 6.3 (H) 4.8 - 5.6 % Final    Comment:    (NOTE) Pre diabetes:          5.7%-6.4% Diabetes:              >6.4% Glycemic control for   <7.0% adults with diabetes   06/02/2017 07:41 AM 9.5 (H) 4.8 - 5.6 % Final    Comment:    (NOTE)         Prediabetes: 5.7 - 6.4         Diabetes: >6.4         Glycemic control for adults with diabetes: <7.0     CBG: Recent Labs  Lab 07/26/18 2023 07/26/18 2354 07/27/18 0407 07/27/18 0812 07/27/18 1128  GLUCAP 274* 185* 174* 131* 209*    No results found for this or any previous visit (from the past 240 hour(s)).   Scheduled Meds: . chlorhexidine  15 mL Mouth Rinse BID  . gabapentin  300 mg Oral TID  . gemfibrozil  600 mg Oral BID AC  . insulin aspart  0-9 Units Subcutaneous Q4H  . LORazepam      . LORazepam  1 mg Intravenous Once  . mouth rinse  15 mL Mouth Rinse q12n4p  . metoprolol tartrate  25 mg Oral BID   Continuous Infusions: . sodium chloride 10 mL/hr at 07/26/18 1250  . feeding supplement (JEVITY 1.2 CAL) 1,000 mL (07/26/18 1630)  . heparin 950 Units/hr (07/27/18 1133)  . lacosamide (VIMPAT) IV 200 mg (07/27/18 1125)  . levETIRAcetam 1,000 mg (07/27/18 1538)  . levETIRAcetam       LOS: 14 days   [name] T. [facility] Pager [phone]  If 7PM-7AM, please contact night-coverage [email or web address] Password [email or web address] 07/27/2018, 4:14 PM

## 2018-07-27 NOTE — Progress Notes (Signed)
Physical Therapy Treatment Patient Details Name: [name] MRN: [id number] DOB: 06/03/1951 Today's Date: 07/27/2018    History of Present Illness 67 year old woman, history of prior CVAs with carotid artery disease, hypertension, hyperlipidemia, normal pressure hydrocephalus status post shunt.  Admitted with seizures and a new left MCA territory stroke. . Had recurrent seizures and transferred to ICU 9/29 for Versed and seizure suppression.  Intubated and ventilated 9/29. Extubated 10/6.    PT Comments    Pt more lethargic on arrival.  Chart shows increased seizure activity and she is just finished EEG for more results. Pt required increased assistance to roll, achieve sitting and scoot to edge of bed.  Pt unable to stand today so performed squat pivot from bed to recliner chair.  Pt is slow and guarded during session.  Once sitting up reports mild dizziness and recliner reclined further to ease into transition of sitting completely upright.  Daughter remains at bedside and it very supportive.  Will continue PT with emphasis on progression of functional mobility.      Follow Up Recommendations  CIR     Equipment Recommendations  None recommended by PT    Recommendations for Other Services Rehab consult;OT consult     Precautions / Restrictions Precautions Precautions: Fall Precaution Comments: highly fearful of falling Restrictions Other Position/Activity Restrictions: dtr reports pt fell PTA and has a R ankle contusion, bruising noted    Mobility  Bed Mobility Overal bed mobility: Needs Assistance Bed Mobility: Rolling;Sidelying to Sit Rolling: Max assist;+2 for physical assistance Sidelying to sit: Max assist;+2 for physical assistance       General bed mobility comments: Pt required assistance to facilitate rolling to R side of bed and for LE advancement and trunk elevation.  Pt with decreased ability to scoot forward despite her best effort,  Pad moved from under  bottom after sitting up and unable to advacne to edge of bed.    Transfers Overall transfer level: Needs assistance Equipment used: None Transfers: Squat Pivot Transfers(To L drop arm chair.  )     Squat pivot transfers: Total assist;+2 physical assistance     General transfer comment: Pt with limited participation required total assistance to pivot from bed to recliner chair.  Once in recliner chair she required total assistance to scoot back into recliner.  Pt reports mild dizziness sitting up and reclined back on recliner to ease transition into upright position.    Ambulation/Gait Ambulation/Gait assistance: (NT she is not safe for progression of gait training at this time.  )               Stairs             Wheelchair Mobility    Modified Rankin (Stroke Patients Only) Modified Rankin (Stroke Patients Only) Pre-Morbid Rankin Score: Moderately severe disability Modified Rankin: Severe disability     Balance     Sitting balance-Leahy Scale: Poor Sitting balance - Comments: Pt when left with out support looses balance in multiple planes in sitting.       Standing balance-Leahy Scale: Zero Standing balance comment: Unable to stand upright during today's session.                              Cognition Arousal/Alertness: Lethargic Behavior During Therapy: Flat affect Overall Cognitive Status: Difficult to assess  Current Attention Level: Selective                  Exercises      General Comments        Pertinent Vitals/Pain Pain Assessment: Faces Faces Pain Scale: Hurts a little bit Pain Location: Generalized.   Pain Descriptors / Indicators: Grimacing Pain Intervention(s): Monitored during session;Repositioned    Home Living                      Prior Function            PT Goals (current goals can now be found in the care plan section) Acute Rehab PT Goals Patient Stated Goal:  didn't state Potential to Achieve Goals: Fair Progress towards PT goals: Not progressing toward goals - comment(regression)    Frequency    Min 4X/week      PT Plan Current plan remains appropriate    Co-evaluation PT/OT/SLP Co-Evaluation/Treatment: Yes Reason for Co-Treatment: Complexity of the patient's impairments (multi-system involvement);For patient/therapist safety;To address functional/ADL transfers;Necessary to address cognition/behavior during functional activity PT goals addressed during session: Mobility/safety with mobility OT goals addressed during session: ADL's and self-care      AM-PAC PT "6 Clicks" Daily Activity  Outcome Measure  Difficulty turning over in bed (including adjusting bedclothes, sheets and blankets)?: Unable Difficulty moving from lying on back to sitting on the side of the bed? : Unable Difficulty sitting down on and standing up from a chair with arms (e.g., wheelchair, bedside commode, etc,.)?: Unable Help needed moving to and from a bed to chair (including a wheelchair)?: Total Help needed walking in hospital room?: Total Help needed climbing 3-5 steps with a railing? : Total 6 Click Score: 6    End of Session Equipment Utilized During Treatment: Gait belt Activity Tolerance: Patient limited by lethargy;Patient limited by fatigue Patient left: in chair;with call bell/phone within reach;with family/visitor present Nurse Communication: Mobility status PT Visit Diagnosis: Other abnormalities of gait and mobility (R26.89);History of falling (Z91.81);Other symptoms and signs involving the nervous system (R29.898);Muscle weakness (generalized) (M62.81)     Time: 8657-8469 PT Time Calculation (min) (ACUTE ONLY): 29 min  Charges:  $Therapeutic Activity: 8-22 mins                     Governor Rooks, PTA Acute Rehabilitation Services Pager [phone] Office [phone]     Kariel Skillman [name] 07/27/2018, 12:23 PM

## 2018-07-27 NOTE — Progress Notes (Signed)
EEG complete - results pending 

## 2018-07-27 NOTE — Progress Notes (Signed)
Occupational Therapy Treatment Patient Details Name: [name] MRN: [id number] DOB: 08/15/1951 Today's Date: 07/27/2018    History of present illness 67 year old woman, history of prior CVAs with carotid artery disease, hypertension, hyperlipidemia, normal pressure hydrocephalus status post shunt.  Admitted with seizures and a new left MCA territory stroke. . Had recurrent seizures and transferred to ICU 9/29 for Versed and seizure suppression.  Intubated and ventilated 9/29. Extubated 10/6.   OT comments  Pt progressing towards acute OT goals. Pt lethargic this session, noted recent increase in seizure activity overnight. EEG results pending. Pt completed bed mobility and simulated toilet transfer (squat-pivot from EOB to drop-arm recliner) with +2 physical assist. Struggled with initiation of grooming task with setup, multimodal cues, and hand-over-hand provided. Daughter present throughout session. D/c plan remains appropriate.    Follow Up Recommendations  CIR;Supervision/Assistance - 24 hour    Equipment Recommendations  None recommended by OT    Recommendations for Other Services      Precautions / Restrictions Precautions Precautions: Fall Precaution Comments: highly fearful of falling Restrictions Weight Bearing Restrictions: No Other Position/Activity Restrictions: dtr reports pt fell PTA and has a R ankle contusion, bruising noted       Mobility Bed Mobility Overal bed mobility: Needs Assistance Bed Mobility: Rolling;Sidelying to Sit Rolling: Max assist;+2 for physical assistance Sidelying to sit: Max assist;+2 for physical assistance       General bed mobility comments: Pt required assistance to facilitate rolling to R side of bed and for LE advancement and trunk elevation.  Pt with decreased ability to scoot forward despite her best effort,  Pad moved from under bottom after sitting up and unable to advacne to edge of bed.    Transfers Overall transfer  level: Needs assistance Equipment used: 2 person hand held assist Transfers: Squat Pivot Transfers     Squat pivot transfers: Total assist;+2 physical assistance     General transfer comment: Pt with limited participation required total assistance to pivot from bed to recliner chair.  Once in recliner chair she required total assistance to scoot back into recliner.  Pt reports mild dizziness sitting up and reclined back on recliner to ease transition into upright position.      Balance Overall balance assessment: Needs assistance Sitting-balance support: Feet unsupported Sitting balance-Leahy Scale: Poor Sitting balance - Comments: Pt when left with out support looses balance in multiple planes in sitting.   Postural control: Left lateral lean Standing balance support: Bilateral upper extremity supported;During functional activity Standing balance-Leahy Scale: Zero Standing balance comment: Unable to stand upright during today's session.                             ADL either performed or assessed with clinical judgement   ADL Overall ADL's : Needs assistance/impaired Eating/Feeding: NPO Eating/Feeding Details (indicate cue type and reason): NPO Grooming: Maximal assistance;Sitting Grooming Details (indicate cue type and reason): cognition?                 Toilet Transfer: Moderate assistance;Maximal assistance;+2 for physical assistance;Squat-pivot;BSC Toilet Transfer Details (indicate cue type and reason): simulated with EOB> droparm recliner Toileting- Clothing Manipulation and Hygiene: Total assistance;Bed level         General ADL Comments: Pt completed bed mobility and simulated toilet transfer as detailed above. Attempted grooming task which pt struggled to initiate despite multimodal cues, hand-over-hand.     Vision   Additional Comments: eyes closed throughout  session. briefly opens on command   Perception     Praxis      Cognition  Arousal/Alertness: Lethargic Behavior During Therapy: Flat affect Overall Cognitive Status: Difficult to assess Area of Impairment: Attention;Memory;Following commands;Safety/judgement;Awareness;Problem solving                   Current Attention Level: Selective Memory: Decreased short-term memory Following Commands: Follows one step commands with increased time;Follows one step commands inconsistently Safety/Judgement: Decreased awareness of safety;Decreased awareness of deficits Awareness: Emergent Problem Solving: Slow processing;Difficulty sequencing;Requires verbal cues;Requires tactile cues;Decreased initiation General Comments: Pt with difficulty initiating any task with wet washcloth placed in hand, multimodal cues provided.        Exercises     Shoulder Instructions       General Comments      Pertinent Vitals/ Pain       Pain Assessment: Faces Faces Pain Scale: Hurts a little bit Pain Location: Generalized.   Pain Descriptors / Indicators: Grimacing Pain Intervention(s): Monitored during session;Repositioned  Home Living                                          Prior Functioning/Environment              Frequency  Min 2X/week        Progress Toward Goals  OT Goals(current goals can now be found in the care plan section)  Progress towards OT goals: Progressing toward goals  Acute Rehab OT Goals Patient Stated Goal: didn't state OT Goal Formulation: With family Time For Goal Achievement: 07/28/18 Potential to Achieve Goals: Fair ADL Goals Pt Will Perform Grooming: with set-up;sitting Pt Will Perform Upper Body Bathing: with min assist;sitting Pt Will Transfer to Toilet: with supervision;bedside commode;ambulating Additional ADL Goal #1: Pt will complete bed mobility with supervision as precursor for ADL participation.  Plan Discharge plan remains appropriate    Co-evaluation    PT/OT/SLP Co-Evaluation/Treatment:  Yes Reason for Co-Treatment: Complexity of the patient's impairments (multi-system involvement);Necessary to address cognition/behavior during functional activity;For patient/therapist safety;To address functional/ADL transfers PT goals addressed during session: Mobility/safety with mobility OT goals addressed during session: ADL's and self-care      AM-PAC PT "6 Clicks" Daily Activity     Outcome Measure   Help from another person eating meals?: Total Help from another person taking care of personal grooming?: Total Help from another person toileting, which includes using toliet, bedpan, or urinal?: Total Help from another person bathing (including washing, rinsing, drying)?: Total Help from another person to put on and taking off regular upper body clothing?: Total Help from another person to put on and taking off regular lower body clothing?: Total 6 Click Score: 6    End of Session Equipment Utilized During Treatment: Gait belt;Oxygen  OT Visit Diagnosis: Muscle weakness (generalized) (M62.81);Hemiplegia and hemiparesis Hemiplegia - Right/Left: Right Hemiplegia - caused by: Cerebral infarction   Activity Tolerance Patient limited by lethargy   Patient Left in chair;with call bell/phone within reach;with family/visitor present   Nurse Communication          Time: 8891-6945 OT Time Calculation (min): 35 min  Charges: OT General Charges $OT Visit: 1 Visit OT Treatments $Self Care/Home Management : 8-22 mins  Tyrone Schimke, OT Acute Rehabilitation Services Pager: [phone] Office: [phone]    Hortencia Pilar 07/27/2018, 12:38 PM

## 2018-07-27 NOTE — Progress Notes (Signed)
Pt's daughter asked that labs be collected just a little later after her mother has rested a bit because of the eventful night.

## 2018-07-27 NOTE — Procedures (Signed)
ELECTROENCEPHALOGRAM REPORT   Patient: [name]       Room #: 0W88Q EEG No. ID: [id number] Age: 67 y.o.        Sex: female Referring Physician: Cyndia Skeeters Report Date:  07/27/2018        Interpreting Physician: Alexis Goodell  History: [name] is an 67 y.o. female with intermittent RUE twitching  Medications:  Neurontin, Lopid, Insulin, Vimpat, Keppra, Lopressor, Heparin  Conditions of Recording:  This is a 21 channel routine scalp EEG performed with bipolar and monopolar montages arranged in accordance to the international 10/20 system of electrode placement. One channel was dedicated to EKG recording.  The patient is in the lethargic state.  Description:  The background activity is slow and poorly organized.  It consists of a low voltage mixture of theta and delta activity.  This activity is diffusely distributed and persistent throughout the recording.   There are no episodes of RUE twitching that are noted during the recording.  There is one episode of left foot tremor that occurs with no change in the background rhythm noted.  No epileptiform activity is noted.   Hyperventilation and intermittent photic stimulation were not performed.  IMPRESSION: This EEG is characterized by slowing which is consistent with normal drowse.  Can not rule out the possibility of slowing related to general cerebral disturbance such as a metabolic encephalopathy.  Clinical correlation recommended.  No epileptiform activity is noted.       Alexis Goodell, MD Neurology [phone] 07/27/2018, 3:27 PM

## 2018-07-27 NOTE — Progress Notes (Signed)
Neurology Progress Note   S:// Seen and examined.  Exhibited rhythmic jerking of right upper extremity and right lower extremity with increased frequency throughout the night.  Required multiple doses of benzos and a load of Depakote for that resolved. Concern for breakthrough seizure.   O:// Current vital signs: BP (!) 156/87 (BP Location: Right Arm)   Pulse 88   Temp 99.4 F (37.4 C) (Axillary)   Resp (!) 22   Ht 5\' 2"  (1.575 m)   Wt 56.8 kg   SpO2 100%   BMI 22.90 kg/m  Vital signs in last 24 hours: Temp:  [98 F (36.7 C)-99.4 F (37.4 C)] 99.4 F (37.4 C) (10/09 0737) Pulse Rate:  [86-93] 88 (10/09 0327) Resp:  [17-22] 22 (10/09 0737) BP: (120-176)/(77-92) 156/87 (10/09 0737) SpO2:  [99 %-100 %] 100 % (10/09 0737) Weight:  [56.8 kg] 56.8 kg (10/09 0356) GENERAL:Well-developed well-nourished, sleeping, easily arousable, in no distress HEENT: - Normocephalic and atraumatic, dry mm, no LN++, no Thyromegally LUNGS - Clear to auscultation bilaterally with no wheezes CV - S1S2 RRR, no m/r/g, equal pulses bilaterally. ABDOMEN - Soft, nontender, nondistended with normoactive BS Ext: warm, well perfused, intact peripheral pulses,no edema NEURO:  Mental Status:Sleepy, oriented to self after waking up.. Not oriented to time. Able to answer questions appropriately with yes and no. Unable to tell me the year of her birth or her age correctly Language: speech ismildly dysarthric and hypophonic.Poor attention concentration at this time. Unable to participate in testing naming and repetition. Able to comprehend simple commands consistently but unable to follow multistep commands. Cranial Nerves: PERRL . EOMI, visual fields full, no facial asymmetry Motor:Able to raise both arms up against gravity but is weaker on the right upper extremity 4/5 with right grip strength barely 2/5. Able to lift both legs against gravity with mild paresis of the right lower extremity-4/5. Tone:  is normal and bulk is normal Sensation- Intact to light touch bilaterally Coordination: FTN intact bilaterally Gait- deferred DTRs-brisk throughout the right side Medications  Current Facility-Administered Medications:  .  0.9 %  sodium chloride infusion, , Intravenous, Continuous, Cherene Altes, MD, Last Rate: 10 mL/hr at 07/26/18 1250 .  [DISCONTINUED] acetaminophen (TYLENOL) tablet 650 mg, 650 mg, Oral, Q4H PRN, 650 mg at 07/16/18 2056 **OR** acetaminophen (TYLENOL) solution 650 mg, 650 mg, Per Tube, Q4H PRN, 650 mg at 07/26/18 1907 **OR** acetaminophen (TYLENOL) suppository 650 mg, 650 mg, Rectal, Q4H PRN, Doutova, Anastassia, MD, 650 mg at 07/24/18 1225 .  chlorhexidine (PERIDEX) 0.12 % solution 15 mL, 15 mL, Mouth Rinse, BID, Agarwala, Ravi, MD, 15 mL at 07/26/18 2119 .  feeding supplement (JEVITY 1.2 CAL) liquid 1,000 mL, 1,000 mL, Per Tube, Continuous, Agarwala, Ravi, MD, Last Rate: 60 mL/hr at 07/26/18 1630, 1,000 mL at 07/26/18 1630 .  gabapentin (NEURONTIN) capsule 300 mg, 300 mg, Oral, TID, Minor, Grace Bushy, NP, 300 mg at 07/26/18 2119 .  gemfibrozil (LOPID) tablet 600 mg, 600 mg, Oral, BID AC, Emokpae, Courage, MD, 600 mg at 07/27/18 0829 .  heparin ADULT infusion 100 units/mL (25000 units/246mL sodium chloride 0.45%), 950 Units/hr, Intravenous, Continuous, Gonfa, Taye T, MD, Last Rate: 8.5 mL/hr at 07/26/18 2128, 850 Units/hr at 07/26/18 2128 .  insulin aspart (novoLOG) injection 0-9 Units, 0-9 Units, Subcutaneous, Q4H, Doutova, Anastassia, MD, 1 Units at 07/27/18 0829 .  labetalol (NORMODYNE,TRANDATE) injection 10 mg, 10 mg, Intravenous, Q2H PRN, Etheleen Nicks, MD, 10 mg at 07/26/18 1906 .  lacosamide (VIMPAT) 200 mg  in sodium chloride 0.9 % 25 mL IVPB, 200 mg, Intravenous, Q12H, Greta Doom, MD, Last Rate: 90 mL/hr at 07/26/18 2112, 200 mg at 07/26/18 2112 .  levETIRAcetam (KEPPRA) IVPB 1000 mg/100 mL premix, 1,000 mg, Intravenous, Q12H, Kerney Elbe, MD, Last  Rate: 400 mL/hr at 07/26/18 2308, 1,000 mg at 07/26/18 2308 .  MEDLINE mouth rinse, 15 mL, Mouth Rinse, q12n4p, Agarwala, Ravi, MD, 15 mL at 07/26/18 1635 .  metoprolol tartrate (LOPRESSOR) tablet 25 mg, 25 mg, Oral, BID, Minor, Grace Bushy, NP, 25 mg at 07/26/18 2119 .  ondansetron (ZOFRAN) injection 4 mg, 4 mg, Intravenous, Q6H PRN, Bodenheimer, Charles A, NP, 4 mg at 07/26/18 0017 .  valproate (DEPACON) 500 mg in dextrose 5 % 50 mL IVPB, 500 mg, Intravenous, Q12H, Aroor, Lanice Schwab, MD Labs CBC    Component Value Date/Time   WBC 12.0 (H) 07/27/2018 0548   RBC 3.57 (L) 07/27/2018 0548   HGB 9.6 (L) 07/27/2018 0548   HCT 32.4 (L) 07/27/2018 0548   PLT 395 07/27/2018 0548   MCV 90.8 07/27/2018 0548   MCH 26.9 07/27/2018 0548   MCHC 29.6 (L) 07/27/2018 0548   RDW 15.3 07/27/2018 0548   LYMPHSABS 1.2 07/25/2018 0736   MONOABS 0.4 07/25/2018 0736   EOSABS 0.0 07/25/2018 0736   BASOSABS 0.0 07/25/2018 0736   CMP     Component Value Date/Time   NA 139 07/27/2018 0548   NA 137 01/15/2016 1632   K 3.7 07/27/2018 0548   CL 102 07/27/2018 0548   CO2 26 07/27/2018 0548   GLUCOSE 99 07/27/2018 0548   BUN 15 07/27/2018 0548   BUN 14 01/15/2016 1632   CREATININE 0.72 07/27/2018 0548   CALCIUM 8.7 (L) 07/27/2018 0548   PROT 6.3 (L) 07/27/2018 0548   ALBUMIN 2.8 (L) 07/27/2018 0548   AST 19 07/27/2018 0548   ALT 16 07/27/2018 0548   ALKPHOS 72 07/27/2018 0548   BILITOT 0.4 07/27/2018 0548   GFRNONAA >60 07/27/2018 0548   GFRAA >60 07/27/2018 0548  Valproate level 32  Assessment:  67 year old with 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 She was extubated 2 days ago and continued to exhibit some twitching of the right upper extremity  likely subcortical myoclonus versus hyperreflexia. In addition to her Keppra, Vimpat and Neurontin she was also started on Klonopin yesterday but at night had increased frequency of rhythmic jerking of her right upper extremity and lower extremity that required a load of valproic acid as well as couple of doses of benzodiazepines to resolve.  Her valproic acid level is still subtherapeutic and her seizures are resolved.  Instead of adding a fourth agent, I would up the dose of Keppra.  See recommendation as below. Likely breakthrough seizure and might need more antiepileptics.  Recommendations: --I would increase the Keppra to 1500 mg twice daily --Routine EEG --Continue Vimpat 200 mg twice daily --Continue Neurontin 300 mg 3 times daily --Continue Klonopin 0.25 mg twice daily --Discontinue Depakote for now.  Can consider adding later. --Can change heparin to Eliquis once certain that she would not require any surgical procedures including PEG tube placement. maintain seizure precautions --We will follow with you.  -- Amie Portland, MD [facility] Pager: [phone] If 7pm to 7am,  please call on call as listed on AMION.

## 2018-07-28 DIAGNOSIS — G40219 Localization-related (focal) (partial) symptomatic epilepsy and epileptic syndromes with complex partial seizures, intractable, without status epilepticus: Secondary | ICD-10-CM

## 2018-07-28 LAB — GLUCOSE, CAPILLARY
GLUCOSE-CAPILLARY: 173 mg/dL — AB (ref 70–99)
GLUCOSE-CAPILLARY: 202 mg/dL — AB (ref 70–99)
GLUCOSE-CAPILLARY: 207 mg/dL — AB (ref 70–99)
Glucose-Capillary: 167 mg/dL — ABNORMAL HIGH (ref 70–99)
Glucose-Capillary: 189 mg/dL — ABNORMAL HIGH (ref 70–99)
Glucose-Capillary: 197 mg/dL — ABNORMAL HIGH (ref 70–99)

## 2018-07-28 LAB — HEPARIN LEVEL (UNFRACTIONATED): Heparin Unfractionated: 0.42 IU/mL (ref 0.30–0.70)

## 2018-07-28 LAB — CBC
HEMATOCRIT: 30.2 % — AB (ref 36.0–46.0)
Hemoglobin: 8.8 g/dL — ABNORMAL LOW (ref 12.0–15.0)
MCH: 26.3 pg (ref 26.0–34.0)
MCHC: 29.1 g/dL — ABNORMAL LOW (ref 30.0–36.0)
MCV: 90.1 fL (ref 80.0–100.0)
NRBC: 0 % (ref 0.0–0.2)
PLATELETS: 398 10*3/uL (ref 150–400)
RBC: 3.35 MIL/uL — AB (ref 3.87–5.11)
RDW: 15.5 % (ref 11.5–15.5)
WBC: 10.9 10*3/uL — ABNORMAL HIGH (ref 4.0–10.5)

## 2018-07-28 MED ORDER — INSULIN ASPART 100 UNIT/ML ~~LOC~~ SOLN
0.0000 [IU] | Freq: Three times a day (TID) | SUBCUTANEOUS | Status: DC
  Administered 2018-07-28: 3 [IU] via SUBCUTANEOUS
  Administered 2018-07-29 (×3): 5 [IU] via SUBCUTANEOUS
  Administered 2018-07-30: 3 [IU] via SUBCUTANEOUS
  Administered 2018-07-30: 8 [IU] via SUBCUTANEOUS
  Administered 2018-07-30: 3 [IU] via SUBCUTANEOUS
  Administered 2018-07-31 (×2): 8 [IU] via SUBCUTANEOUS
  Administered 2018-07-31: 2 [IU] via SUBCUTANEOUS

## 2018-07-28 MED ORDER — PRO-STAT SUGAR FREE PO LIQD
30.0000 mL | Freq: Every day | ORAL | Status: DC
  Administered 2018-07-28 – 2018-08-09 (×13): 30 mL
  Filled 2018-07-28 (×13): qty 30

## 2018-07-28 MED ORDER — INSULIN ASPART 100 UNIT/ML ~~LOC~~ SOLN
0.0000 [IU] | Freq: Every day | SUBCUTANEOUS | Status: DC
  Administered 2018-07-28: 2 [IU] via SUBCUTANEOUS

## 2018-07-28 MED ORDER — FREE WATER
100.0000 mL | Freq: Three times a day (TID) | Status: DC
  Administered 2018-07-28 – 2018-08-04 (×18): 100 mL

## 2018-07-28 NOTE — Progress Notes (Signed)
   Patient continued to have intermittent seizures.  Will delay any surgical intervention for further down the road.  Plan to follow-up as outpatient and 6 weeks or so to evaluate for recovery.  [name] C. Donzetta Matters, MD [facility] Office: [phone] Pager: [phone]

## 2018-07-28 NOTE — Progress Notes (Signed)
Patient ID: [name], female   DOB: 1951/06/18, 67 y.o.   MRN: [id number]  Thank you for consulting the Palliative Medicine Team at [facility] to meet your patient's and family's needs.   The reason that you asked [place] to see your patient is to Establish GOC and emotional support  We have scheduled your patient for a meeting: Tomorrow 07/29/18 at 10:00 with Mariana Kaufman  NP  Other family members that will be present: Husband and daughter will be present  Wadie Lessen NP  Palliative Medicine Team Team Phone # [phone] Pager [phone]  No charge

## 2018-07-28 NOTE — Progress Notes (Signed)
Physical Therapy Treatment Patient Details Name: [name] MRN: [id number] DOB: 1951/10/08 Today's Date: 07/28/2018    History of Present Illness 67 year old woman, history of prior CVAs with carotid artery disease, hypertension, hyperlipidemia, normal pressure hydrocephalus status post shunt.  Admitted with seizures and a new left MCA territory stroke. . Had recurrent seizures and transferred to ICU 9/29 for Versed and seizure suppression.  Intubated and ventilated 9/29. Extubated 10/6.    PT Comments    Pt performed sit to stand transfers and much more alert during session with clear speech.  Pt left in care of SLP.  Plan for standing trials next session and pre gait activities.  Continue to recommend CIR therapies to improve strength and function before returning home.      Follow Up Recommendations  CIR     Equipment Recommendations  None recommended by PT    Recommendations for Other Services Rehab consult;OT consult     Precautions / Restrictions Precautions Precautions: Fall Precaution Comments: highly fearful of falling Restrictions Other Position/Activity Restrictions: dtr reports pt fell PTA and has a R ankle contusion, bruising noted    Mobility  Bed Mobility Overal bed mobility: Needs Assistance Bed Mobility: Rolling;Sidelying to Sit Rolling: Min assist Sidelying to sit: Mod assist     Sit to sidelying: Min assist General bed mobility comments: Pt performed all mobility with decreased assistance.  Pt anxious to mobilize due to fear of falling but following commands much better today.  Pt required +2 assistance to boost to [facility] on return back to bed and placed in chair position.    Transfers Overall transfer level: Needs assistance Equipment used: Ambulation equipment used(sara stedy)   Sit to Stand: Mod assist;+2 safety/equipment         General transfer comment: Pt able to follow commands for hand placement on stedy frame.  Pt performed x 3  standing trials but remains flexed in upper trunk.  MOd assistance to boost into standing.    Ambulation/Gait Ambulation/Gait assistance: (NT)               Stairs             Wheelchair Mobility    Modified Rankin (Stroke Patients Only)       Balance Overall balance assessment: Needs assistance   Sitting balance-Leahy Scale: Poor Sitting balance - Comments: Pt when left with out support looses balance in multiple planes in sitting.       Standing balance-Leahy Scale: Poor Standing balance comment: Required assistance of LUE in standing.                              Cognition Arousal/Alertness: Awake/alert Behavior During Therapy: Anxious Overall Cognitive Status: Impaired/Different from baseline                                 General Comments: Pt perseverated on wanting to return to supine position.        Exercises      General Comments        Pertinent Vitals/Pain Pain Assessment: No/denies pain Faces Pain Scale: Hurts a little bit Pain Location: Generalized.   Pain Descriptors / Indicators: Grimacing Pain Intervention(s): Monitored during session;Repositioned    Home Living                      Prior Function  PT Goals (current goals can now be found in the care plan section) Acute Rehab PT Goals Patient Stated Goal: didn't state Potential to Achieve Goals: Fair Progress towards PT goals: Progressing toward goals    Frequency    Min 4X/week      PT Plan Current plan remains appropriate    Co-evaluation              AM-PAC PT "6 Clicks" Daily Activity  Outcome Measure  Difficulty turning over in bed (including adjusting bedclothes, sheets and blankets)?: Unable Difficulty moving from lying on back to sitting on the side of the bed? : Unable Difficulty sitting down on and standing up from a chair with arms (e.g., wheelchair, bedside commode, etc,.)?: Unable Help needed  moving to and from a bed to chair (including a wheelchair)?: Total Help needed walking in hospital room?: Total Help needed climbing 3-5 steps with a railing? : Total 6 Click Score: 6    End of Session Equipment Utilized During Treatment: Gait belt Activity Tolerance: Patient limited by lethargy;Patient limited by fatigue Patient left: in chair;with call bell/phone within reach;with family/visitor present Nurse Communication: Mobility status PT Visit Diagnosis: Other abnormalities of gait and mobility (R26.89);History of falling (Z91.81);Other symptoms and signs involving the nervous system (R29.898);Muscle weakness (generalized) (M62.81)     Time: 5726-2035 PT Time Calculation (min) (ACUTE ONLY): 26 min  Charges:  $Therapeutic Activity: 23-37 mins                     Governor Rooks, PTA Acute Rehabilitation Services Pager [phone] Office [phone]     Saide Lanuza [name] 07/28/2018, 5:11 PM

## 2018-07-28 NOTE — Progress Notes (Signed)
ANTICOAGULATION CONSULT NOTE - Follow Up Consult  Pharmacy Consult for Heparin  Indication: CVA and carotid thrombus  Allergies  Allergen Reactions  . Carbamazepine Hives, Shortness Of Breath and Other (See Comments)  . Penicillins Anaphylaxis, Other (See Comments) and Swelling    Mother, father and brother have history of anaphylaxis reaction to penicillin so pt does not take  My throat swells Familial history of anaphylaxis   . Statins Other (See Comments)    Severe muscle weakness, leg numbness, severe headaches, chest pain   . Tricyclic Antidepressants Other (See Comments)    IMPAIRED MEMORY  . Atorvastatin Other (See Comments)    Severe muscle weakness, leg numbness, severe headaches, chest pain   . Ezetimibe Other (See Comments)    Severe muscle weakness, leg numbness, severe headaches, chest pain   . Nortriptyline Other (See Comments)    IMPAIRED MEMORY  . Quinapril Hcl Other (See Comments)    Unknown reaction  . Rifampin Diarrhea  . Metoclopramide Nausea And Vomiting and Rash    Patient Measurements: Height: 5\' 2"  (157.5 cm) Weight: 124 lb 12.5 oz (56.6 kg) IBW/kg (Calculated) : 50.1 Heparin Dosing Weight:  60.1 kg  Vital Signs: Temp: 97.6 F (36.4 C) (10/10 0729) Temp Source: Oral (10/10 0729) BP: 145/70 (10/10 0729) Pulse Rate: 96 (10/10 0729)  Labs: Recent Labs    07/26/18 0353  07/26/18 1938 07/27/18 0548 07/28/18 0429  HGB 10.8*  --   --  9.6* 8.8*  HCT 35.1*  --   --  32.4* 30.2*  PLT 379  --   --  395 398  HEPARINUNFRC 0.29*   < > 0.37 0.27* 0.42  CREATININE 0.89  --   --  0.72  --    < > = values in this interval not displayed.    Estimated Creatinine Clearance: 54.7 mL/min (by C-G formula based on SCr of 0.72 mg/dL).  Assessment:  Anticoag: Heparin for carotid thrombus, CVA.  CBC stable Heparin level therapeutic   Goal of Therapy:  Heparin level 0.3-0.5 units/ml Monitor platelets by anticoagulation protocol: Yes   Plan:   Continue heparin at 950 units / hr Daily heparin level, CBC  Thank you Anette Guarneri, PharmD [phone] 07/28/2018 11:00 AM

## 2018-07-28 NOTE — Progress Notes (Signed)
Jerking of right  Upper extremities  Was noted couple of times with no active seizures.  Activities. Tolerating TF via cor tract. . Pt follows some command  No acute distress.

## 2018-07-28 NOTE — Progress Notes (Addendum)
Subjective: Currently laying in bed with nasogastric tube and appears extremely lethargic.  When asked where she is she does state that she is in the hospital but does not know the month or the year.  She does follow simple commands.  No further twitching on the right side.  Exam: Vitals:   07/28/18 0353 07/28/18 0729  BP: (!) 160/87 (!) 145/70  Pulse: (!) 101 96  Resp: 16 18  Temp: 97.8 F (36.6 C) 97.6 F (36.4 C)  SpO2: 94% 97%    Physical Exam   HEENT-  Normocephalic, no lesions, without obvious abnormality.  Normal external eye and conjunctiva.   Extremities- Warm, dry and intact Musculoskeletal-no joint tenderness, deformity or swelling Skin-warm and dry, no hyperpigmentation, vitiligo, or suspicious lesions   . Today's Vitals   07/27/18 2107 07/27/18 2343 07/28/18 0353 07/28/18 0729  BP: 136/70 (!) 146/82 (!) 160/87 (!) 145/70  Pulse: 99 91 (!) 101 96  Resp:  16 16 18   Temp:  98.8 F (37.1 C) 97.8 F (36.6 C) 97.6 F (36.4 C)  TempSrc:  Oral Oral Oral  SpO2:  97% 94% 97%  Weight:   56.6 kg   Height:      PainSc:       Body mass index is 22.82 kg/m. sign  .   Neuro:  Mental Status: Alert, lethargic, oriented to hospital, her birthday, but not the year and the month.  Speech hypophonic however I do not note any aphasia she does have dysarthria but I think this is secondary to dry mouth and the gastric tube.  She is able to follow simple commands such as counting fingers and raising her arm.  Cranial Nerves: II:  Visual fields grossly normal,  III,IV, VI: ptosis not present, extra-ocular motions intact bilaterally pupils equal, round, reactive to light and accommodation V,VII: smile symmetric, facial light touch sensation normal bilaterally VIII: hearing normal bilaterally IX,X: uvula rises midline XI: bilateral shoulder shrug XII: midline tongue extension Motor: Told to raise both arms up against gravity but is weak on the right upper extremity than the  left.  Able to lift both legs antigravity with mild paresis on the right. Sensory: Pinprick and light touch intact throughout, bilaterally Deep Tendon Reflexes: Throughout on the right side Plantars: Right: downgoing   Left: downgoing Cerebellar: normal finger-to-nose,     Medications:  Scheduled: . chlorhexidine  15 mL Mouth Rinse BID  . gabapentin  300 mg Oral TID  . gemfibrozil  600 mg Oral BID AC  . insulin aspart  0-9 Units Subcutaneous Q4H  . LORazepam  1 mg Intravenous Once  . mouth rinse  15 mL Mouth Rinse q12n4p  . metoprolol tartrate  25 mg Oral BID   Continuous: . sodium chloride 10 mL/hr at 07/26/18 1250  . feeding supplement (JEVITY 1.2 CAL) 1,000 mL (07/28/18 0801)  . heparin 950 Units/hr (07/28/18 0315)  . lacosamide (VIMPAT) IV 200 mg (07/27/18 2029)  . levETIRAcetam 1,500 mg (07/27/18 2121)   PRN:[DISCONTINUED] acetaminophen **OR** acetaminophen (TYLENOL) oral liquid 160 mg/5 mL **OR** acetaminophen, labetalol, ondansetron (ZOFRAN) IV  Pertinent Labs/Diagnostics: EEG: IMPRESSION: This EEG is characterized by slowing which is consistent with normal drowse.  Can not rule out the possibility of slowing related to general cerebral disturbance such as a metabolic encephalopathy.  Clinical correlation recommended.  No epileptiform activity is noted.       Etta Quill PA-C [facility] [phone]   Assessment: 67 year old female with a recent scattered embolic watershed  strokes in the left cerebral hemisphere with a left ICA 60% stenosis in left ICA thrombus protruding into the lumen.  Patient started developing frequent seizures in the right side of her body which required antiepileptics and Versed drip for cessation.  Patient was extubated 3 days ago and continued to have right upper extremity and lower extremity twitching.  Patient was placed on Keppra, Vimpat and Neurontin.  Devious night she had increased frequency of rhythmic jerking of the right  upper extremity which required a load of valproic acid.  Yesterday patient again had breakthrough seizures and required a bolus of Keppra.  Keppra was also increased to 1500 mg twice daily.  Currently she is on Neurontin 300 mg 3 times a day, Vimpat 200 mg every 12 hours, Keppra 1500 mg every 12 hours.  Currently she is having no seizures.   Recommendations: -Would continue current antiepileptic regimen Keppra 1500 BID Vimpat 200 BID Neurontin 300 TID Klonopin 0.25 BID --Consider Depakote if seizures recur clinically. EEG has been negative. - Contains heparin to Eliquis once certain that she would not require any surgical procedure including PEG tube replacement. --Have left a message for vascular surgeon Dr. Donzetta Matters to discuss the case in regards to timing of CEA.  07/28/2018, 10:44 AM  Attending Neurohospitalist Addendum Patient seen and examined with APP/Resident. Agree with the history and physical as documented above. Agree with the plan as documented, which I helped formulate. I have independently reviewed the chart, obtained history, review of systems and examined the patient.I have personally reviewed pertinent head/neck/spine imaging (CT/MRI). Please feel free to call with any questions. --- Amie Portland, MD [facility] Pager: [phone]  If 7pm to 7am, please call on call as listed on AMION.

## 2018-07-28 NOTE — Progress Notes (Signed)
[place] TEAM 1 - Stepdown/ICU TEAM  [name]  WLN:[id number] DOB: 1951-01-14 DOA: 07/13/2018 PCP: [name], MD    Brief Narrative:  67 year old F w/ a history of prior CVAs with carotid artery disease, hypertension, hyperlipidemia, normal pressure hydrocephalus status post shunt who was admitted with seizures and a new left MCA territory stroke.  Treated with heparin and antiplatelets. Had recurrent seizures and transferred to ICU 9/29 for Versed and seizure suppression.  Intubated and ventilated 9/29-10/6.   Significant Events: 9/25 Admit  9/29 To ICU for burst suppression  10/6 extubated - post extubation stridor treated with racemic epi, heliox, BiPAP 10/7 Off BiPAP 10/8 TRH assumed care   Subjective: Patient had rhythmic jerking of right upper arm and right lower extremity overnight.  Neurology was called and gave Ativan and loading dose of valproic acid.  Patient is sent home drowsy this morning.  Oriented to self and place but not time.  Somewhat tachycardic.  She is on oxygen.  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 -Continue heparin now.  May transition to Eliquis if she doesn't need procedure for them. -Optimal CBG control -Palliative consulted-will meet family 10/11  Partial seizures originating from the left hemisphere with status epilepticus: Status epilepticus resolved but patient with intermittent twitching of right extremities.  EEG did not show seizure activity.  Care as per Neurology -(continue Keppra, Vimpat, Neurontin and Klonopin)  Possible myoclonus right arm Neurology following  Acute respiratory failure due to AMS and inability to protect airway Resolved w/ pt now on minimal O2 support     Dysphagia  SLP following - cont cortrak feeding for now - to have MBS today   DM: CBGs elevated. A1c 6.3  -SSI thin to moderate -Consider adding basal if continues to be elevated  -Needs optimal CBG control in the setting of CVA.  NPH s/p shunt placement   HTN BP remains poorly controlled - avoid overaggressive correction - gently adjust tx and follow trend   HLD Cont lopid  DVT prophylaxis: IV heparin  Code Status: FULL CODE Family Communication: Daughter at bedside.  Okay with palliative consult. Disposition Plan: Remains in SDU.  High risk for deconditioning  Consultants:  PCCM Neurology  Antimicrobials:  None    Objective: Blood pressure (!) 173/95, pulse (!) 105, temperature 97.6 F (36.4 C), temperature source Oral, resp. rate 15, height 5\' 2"  (1.575 m), weight 56.6 kg, SpO2 98 %.  Intake/Output Summary (Last 24 hours) at 07/28/2018 1435 Last data filed at 07/28/2018 0600 Gross per 24 hour  Intake 1351.67 ml  Output -  Net 1351.67 ml   Filed Weights   07/26/18 1604 07/27/18 0356 07/28/18 0353  Weight: 56.8 kg 56.8 kg 56.6 kg    Examination: GENERAL: cortrak  HEENT: PERRLA, MMM NECK: Supple LUNGS:   On room air.  No IWOB, good air movement, CTAB HEART:  RRR with no murmurs. ABD:  Morbidly obese, soft, NT with active BS EXT:  No C/E/E NEURO: sleepy but follows commands and answers questions fairly.  Cranial nerves appear intact.  2/5 RUE.  4/5 elsewhere.  Biceps and patellar reflex 2+ bilaterally. PSYCH: Calm.   CBC: Recent Labs  Lab 07/24/18 0347 07/25/18 0736 07/26/18 0353 07/27/18 0548 07/28/18 0429  WBC  7.2 8.4 10.0 12.0* 10.9*  NEUTROABS 4.4 6.7  --   --   --   HGB 8.8* 11.2* 10.8* 9.6* 8.8*  HCT 28.8* 36.1 35.1* 32.4* 30.2*  MCV 90.0 89.4 90.2 90.8 90.1  PLT 246 339 379 395 858   Basic Metabolic Panel: Recent Labs  Lab 07/24/18 0347  07/25/18 0453 07/26/18 0353 07/27/18 0548  NA 140   < > 141 142 139  K 3.6   < > 3.8 3.5 3.7   CL 106   < > 97* 101 102  CO2 26   < > 27 31 26   GLUCOSE 160*   < > 160* 266* 99  BUN 10   < > 15 19 15   CREATININE 0.72   < > 0.92 0.89 0.72  CALCIUM 8.9   < > 9.8 9.0 8.7*  MG 1.8  --  1.8 2.0  --   PHOS 3.2  --  5.9* 3.1  --    < > = values in this interval not displayed.   GFR: Estimated Creatinine Clearance: 54.7 mL/min (by C-G formula based on SCr of 0.72 mg/dL).  Liver Function Tests: Recent Labs  Lab 07/27/18 0548  AST 19  ALT 16  ALKPHOS 72  BILITOT 0.4  PROT 6.3*  ALBUMIN 2.8*   No results for input(s): LIPASE, AMYLASE in the last 168 hours. Recent Labs  Lab 07/27/18 0912  AMMONIA 34    HbA1C: Hgb A1c MFr Bld  Date/Time Value Ref Range Status  07/13/2018 11:53 PM 6.3 (H) 4.8 - 5.6 % Final    Comment:    (NOTE) Pre diabetes:          5.7%-6.4% Diabetes:              >6.4% Glycemic control for   <7.0% adults with diabetes   06/02/2017 07:41 AM 9.5 (H) 4.8 - 5.6 % Final    Comment:    (NOTE)         Prediabetes: 5.7 - 6.4         Diabetes: >6.4         Glycemic control for adults with diabetes: <7.0     CBG: Recent Labs  Lab 07/27/18 2024 07/27/18 2344 07/28/18 0353 07/28/18 0750 07/28/18 1122  GLUCAP 170* 146* 189* 173* 202*    No results found for this or any previous visit (from the past 240 hour(s)).   Scheduled Meds: . chlorhexidine  15 mL Mouth Rinse BID  . gabapentin  300 mg Oral TID  . gemfibrozil  600 mg Oral BID AC  . insulin aspart  0-9 Units Subcutaneous Q4H  . LORazepam  1 mg Intravenous Once  . mouth rinse  15 mL Mouth Rinse q12n4p  . metoprolol tartrate  25 mg Oral BID   Continuous Infusions: . sodium chloride 10 mL/hr at 07/26/18 1250  . feeding supplement (JEVITY 1.2 CAL) 1,000 mL (07/28/18 0801)  . heparin 950 Units/hr (07/28/18 0315)  . lacosamide (VIMPAT) IV 200 mg (07/28/18 1202)  . levETIRAcetam 1,500 mg (07/28/18 1318)     LOS: 15 days   [name] T. [facility] Pager [phone]  If  7PM-7AM, please contact night-coverage [email or web address] Password [email or web address] 07/28/2018, 2:35 PM

## 2018-07-28 NOTE — Progress Notes (Signed)
Nutrition Follow-up  DOCUMENTATION CODES:   Not applicable  INTERVENTION:  Jevity 1.2 @ 60 ml/hr (1440 ml/day) via Cortrak tube with 30 ml Prostat once daily.  Provide free water flushes of 100 ml TID per tube.   Provides: 1828 kcal, 95 grams protein, and 1466 ml free water.   NUTRITION DIAGNOSIS:   Inadequate oral intake related to inability to eat as evidenced by NPO status; ongoing  GOAL:   Patient will meet greater than or equal to 90% of their needs; met  MONITOR:   TF tolerance, Labs, Skin, Weight trends, I & O's  REASON FOR ASSESSMENT:   Consult Enteral/tube feeding initiation and management  ASSESSMENT:   67 y.o. female with medical history significant of DM 2, CVA, anemia, GERD, HLD, HTN seizures disorder.Pseudotumor cerebri, chronic refractory headaches, debility, sp PEG and Trach now removed. MRI confirmed new CVA. Extubated 10/6. 10/7 failed swallow evaluation, Cortrak NGT placed.   Pt has been tolerating her tube feeding. Noted no free water flushes ordered. RD to order. IV fluids only infusing at 10 ml/hr. Pt with gradual weight loss. RD to modify tube feeding and add Prostat to aid in increased caloric and protein needs as well as to prevent further weight loss. Pt extremely lethargic per MD. Palliative care has been consulted to establish goals of care with family. Labs and medications reviewed.   Diet Order:   Diet Order            Diet NPO time specified  Diet effective now              EDUCATION NEEDS:   Not appropriate for education at this time  Skin:  Skin Assessment: Reviewed RN Assessment  Last BM:  10/7  Height:   Ht Readings from Last 1 Encounters:  07/13/18 5' 2"  (1.575 m)    Weight:   Wt Readings from Last 1 Encounters:  07/28/18 56.6 kg    Ideal Body Weight:  50 kg  BMI:  Body mass index is 22.82 kg/m.  Estimated Nutritional Needs:   Kcal:  1600-1800  Protein:  75-95 grams  Fluid:  > 1.6 L/day    Corrin Parker, MS, RD, LDN Pager # [phone] After hours/ weekend pager # [phone]

## 2018-07-28 NOTE — Progress Notes (Signed)
  Speech Language Pathology Treatment: Dysphagia;Cognitive-Linquistic  Patient Details Name: [name] MRN: [id number] DOB: 04-11-1951 Today's Date: 07/28/2018 Time: 2725-3664 SLP Time Calculation (min) (ACUTE ONLY): 16 min  Assessment / Plan / Recommendation Clinical Impression  Pt was seen for skilled ST targeting goals for dysphagia and dysarthria.  Pt was working with physical therapy upon therapist's arrival and was very anxious and fearful about standing in the [place] lift, requiring significant encouragement to participate.  Pt had wet, congested cough that wasn't productive except for a small amount of thin secretions.  PTA, pt's daughter, and RN all report that pt is the most alert today than she's been since being hospitalized.  Once pt was repositioned in bed and sitting upright, therapist facilitated the session with therapeutic trials of purees and nectar thick liquids via teaspoon.  PO intake was limited but pt demonstrated no overt s/s of aspiration with small amounts of purees.  She had delayed coughing with teaspoons of nectar thick liquids which seems consistent with report from [place]. Pt's voice was fairly consistently wet throughout today's therapy session but would clear intermittently following a cough and reflexive swallow.   Pt's speech was very dysarthric and difficult to understand with pt seeming to have little awareness of her decreased intelligibility.  As a result, she needed max assist to slow her rate of speech and increase her vocal intensity to achieve intelligibility.  Pt with complaints of dizziness at the end of today's therapy session.  RN made aware.  Pt was left in bed with bed alarm set and daughter at bedside.  Continue per current plan of care.    HPI HPI: Pt is a 67 y.o. female with medical history significant of DM 2, CVA with right sided deficits and dysphagia requiring PEG tube, anemia, GERD, HLD, HTN seizures disorder, chronic refractory headaches,  debility, sp PEG and Trach now removed. Admitted with decreased responsiveness. MRI showed acute and subacute infarcts in the left hemisphere in the left MCA distribution raising possibility of embolic source. Note that based on 05/2018 OP SLP therapy documentation, patient tolerating a regular diet, thin liquids. BSE completed 9/26 this admission recomending NPO pending MBS due to inconsistent s/s of aspiration. Pt declined MBS and was started on a trial of full liquids, but then developed seizure activity and was intubated 9/29. Extubated 10/6. MBS 10/8 recommending continue alternative means of nutrition but allowing snacks of purees to increase use of the hyolaryngeam musculature      SLP Plan  Continue with current plan of care       Recommendations  Diet recommendations: NPO Medication Administration: Via alternative means Compensations: Minimize environmental distractions;Slow rate;Small sips/bites Postural Changes and/or Swallow Maneuvers: Seated upright 90 degrees                Oral Care Recommendations: Oral care QID Follow up Recommendations: Inpatient Rehab SLP Visit Diagnosis: Cognitive communication deficit (R41.841);Dysphagia, pharyngeal phase (R13.13) Plan: Continue with current plan of care       GO                PageSelinda Orion 07/28/2018, 3:33 PM

## 2018-07-29 ENCOUNTER — Telehealth: Payer: Self-pay | Admitting: Vascular Surgery

## 2018-07-29 DIAGNOSIS — Z7189 Other specified counseling: Secondary | ICD-10-CM

## 2018-07-29 DIAGNOSIS — Z515 Encounter for palliative care: Secondary | ICD-10-CM

## 2018-07-29 LAB — BASIC METABOLIC PANEL
ANION GAP: 9 (ref 5–15)
BUN: 16 mg/dL (ref 8–23)
CO2: 28 mmol/L (ref 22–32)
CREATININE: 0.63 mg/dL (ref 0.44–1.00)
Calcium: 8.8 mg/dL — ABNORMAL LOW (ref 8.9–10.3)
Chloride: 104 mmol/L (ref 98–111)
GFR calc Af Amer: >60 mL/min (ref >60)
GFR calc non Af Amer: >60 mL/min (ref >60)
GLUCOSE: 152 mg/dL — AB (ref 70–99)
POTASSIUM: 4.1 mmol/L (ref 3.5–5.1)
Sodium: 141 mmol/L (ref 135–145)

## 2018-07-29 LAB — CBC
HCT: 30.1 % — ABNORMAL LOW (ref 36.0–46.0)
Hemoglobin: 9.2 g/dL — ABNORMAL LOW (ref 12.0–15.0)
MCH: 27.4 pg (ref 26.0–34.0)
MCHC: 30.6 g/dL (ref 30.0–36.0)
MCV: 89.6 fL (ref 80.0–100.0)
NRBC: 0 % (ref 0.0–0.2)
PLATELETS: 399 10*3/uL (ref 150–400)
RBC: 3.36 MIL/uL — AB (ref 3.87–5.11)
RDW: 15.4 % (ref 11.5–15.5)
WBC: 8.7 10*3/uL (ref 4.0–10.5)

## 2018-07-29 LAB — GLUCOSE, CAPILLARY
GLUCOSE-CAPILLARY: 218 mg/dL — AB (ref 70–99)
GLUCOSE-CAPILLARY: 239 mg/dL — AB (ref 70–99)
GLUCOSE-CAPILLARY: 164 mg/dL — AB (ref 70–99)
Glucose-Capillary: 180 mg/dL — ABNORMAL HIGH (ref 70–99)
Glucose-Capillary: 198 mg/dL — ABNORMAL HIGH (ref 70–99)
Glucose-Capillary: 245 mg/dL — ABNORMAL HIGH (ref 70–99)
Glucose-Capillary: 245 mg/dL — ABNORMAL HIGH (ref 70–99)

## 2018-07-29 LAB — HEPARIN LEVEL (UNFRACTIONATED): HEPARIN UNFRACTIONATED: 0.34 [IU]/mL (ref 0.30–0.70)

## 2018-07-29 MED ORDER — GEMFIBROZIL 600 MG PO TABS
600.0000 mg | ORAL_TABLET | Freq: Two times a day (BID) | ORAL | Status: DC
  Administered 2018-07-30 – 2018-08-14 (×24): 600 mg
  Filled 2018-07-29 (×32): qty 1

## 2018-07-29 MED ORDER — LEVETIRACETAM 100 MG/ML PO SOLN
1500.0000 mg | Freq: Two times a day (BID) | ORAL | Status: DC
  Administered 2018-07-29 – 2018-08-09 (×23): 1500 mg
  Filled 2018-07-29 (×24): qty 15

## 2018-07-29 MED ORDER — LACOSAMIDE 200 MG PO TABS
200.0000 mg | ORAL_TABLET | Freq: Two times a day (BID) | ORAL | Status: DC
  Administered 2018-07-29 – 2018-08-09 (×23): 200 mg
  Filled 2018-07-29 (×23): qty 1

## 2018-07-29 MED ORDER — INSULIN DETEMIR 100 UNIT/ML ~~LOC~~ SOLN
10.0000 [IU] | Freq: Every day | SUBCUTANEOUS | Status: DC
  Administered 2018-07-29 – 2018-07-30 (×2): 10 [IU] via SUBCUTANEOUS
  Filled 2018-07-29 (×3): qty 0.1

## 2018-07-29 MED ORDER — METOPROLOL TARTRATE 25 MG PO TABS
25.0000 mg | ORAL_TABLET | Freq: Two times a day (BID) | ORAL | Status: DC
  Administered 2018-07-29 – 2018-07-31 (×5): 25 mg
  Filled 2018-07-29 (×5): qty 1

## 2018-07-29 NOTE — Plan of Care (Signed)
  Problem: Coping: Goal: Ability to identify and develop effective coping behavior will improve Outcome: Progressing   

## 2018-07-29 NOTE — Progress Notes (Signed)
PT Cancellation Note  Patient Details Name: [name] MRN: [id number] DOB: August 25, 1951   Cancelled Treatment:    Reason Eval/Treat Not Completed: (P) Other (comment)(Daughter tearful at bedside and reports today is not a good day.  Pt is sleeping soundly and did not disturb.  Daughter reports patient had a bad night and pallative care has been called in, no note present from palliative will continue efforts.  )   Jarrad Mclees [name] 07/29/2018, 1:26 PM  Governor Rooks, PTA Acute Rehabilitation Services Pager [phone] Office [phone]

## 2018-07-29 NOTE — Consult Note (Signed)
Consultation Note Date: 07/29/2018   Patient Name: [name]  DOB: 1951/04/26  MRN: [id number]  Age / Sex: 67 y.o., female  PCP: [name], MD Referring Physician: Cherene Altes, MD  Reason for Consultation: Establishing goals of care  HPI/Patient Profile: 67 y.o. 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 During admission she experienced severe seizures requiring voluntary intubation and sedation for control. She has been extubated and is awake. Has ongoing dysphagia and continues to have R sided twitching. Much more alert yesterday and today than previously during admission. Participating some with PT and SLP. High risk for aspiration. Has coretrak placed. Palliative medicine consulted for [place] discussion.  Clinical Assessment and Goals of Care:  I have reviewed medical records including EPIC notes, labs and imaging, assessed the patient and then met at the bedside along with patient, two children and spouse  to discuss diagnosis prognosis, GOC, EOL wishes, disposition and options.  I introduced Palliative Medicine as specialized medical care for people living with serious illness. It focuses on providing relief from the symptoms and stress of a serious illness. The goal is to improve quality of life for both the patient and the family.  We discussed a brief life review of the patient. She is known to be "spunky" and have a bright sense of humor. The last year has been difficult but she has worked hard in rehab. Family reports they have made modifications to ensure she has been able to maintain a meaningful quality of life- for example, they took her to a lake that she enjoyed and carried her in her wheelchair and helped  her float in the lake.   As far as functional and nutritional status - they have noticed a decline in the last year. She has not been eating and drinking as much. She has lost a great deal of weight since her initial stroke.    We discussed their current illness and what it means in the larger context of their on-going co-morbidities.  Natural disease trajectory and expectations at EOL were discussed. Family and patient stressed a desire to limit suffering. Family does tend to push patient a little saying things like, "don't you want to see your grandson speak full sentences?" However, family is not unreasonable in their expectations for patient's recovery. Prior to admission patient was dependent for ADL's- required assistance with toileting, used wheelchair most of the time.   The difference between aggressive medical intervention and comfort care was explained.   Patient was "tired" and although she was oriented to person and place, she stated she had difficulty understanding "what was going on". I'm not confident in her ability to make decisions regarding her [place]. However, family deeply desired to hear her opinions. When asked directly about code status she stated she would not want to be resuscitated. Family all agreed that  DNR status was in patient's best interest. We discussed feeding tube- patient states she would not want another feeding tube- but after some discussion with family "pushing" a little- she says "ok".  When we went deeper into full aggressive care vs comfort option- family all expressed opinions that they would support patient in whatever she would choose- if she wanted to go home and be kept comfortable at home- they would support that. If she wanted to continue to CIR, PEG placement, rehospitalization- they would also support that. Patient wasn't able to engage deeply in this topic. Her primary response was "I don't know".    Questions and concerns were addressed.  Hard Choices  booklet left for review. The family was encouraged to call with questions or concerns.   Primary Decision Maker NEXT OF KIN- patient's spouse    SUMMARY OF RECOMMENDATIONS -DNR -Continue current scope of care -PMT will continue to follow with family for [place]    Code Status/Advance Care Planning:  DNR   Palliative Prophylaxis:   Aspiration  Prognosis:    Unable to determine  Discharge Planning: To Be Determined  Primary Diagnoses: Present on Admission: . CVA (cerebral vascular accident) ([place]) . Acute CVA (cerebrovascular accident) ([place]) . Benign essential HTN . Hypokalemia . Type II diabetes mellitus with complication (HCC)   I have reviewed the medical record, interviewed the patient and family, and examined the patient. The following aspects are pertinent.  Past Medical History:  Diagnosis Date  . Anemia    takes iron supplement  . Anesthesia complication    disorientation due to pseudotumor  . Diabetes mellitus    IDDM  . Family history of anesthesia complication 12 yrs ago   brother stopped breathing for a minute or two  . GERD (gastroesophageal reflux disease)   . Headache(784.0)    due to pseudotumor; daily headache  . Hypercholesteremia    unable to tolerate statins  . Hypertension    under control with meds., has been on med. x 5 yr.  . Peripheral neuropathy   . PONV (postoperative nausea and vomiting)   . 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 Synovitis of ankle 04/2013   left  . Wears dentures    full   Social History   Socioeconomic History  . Marital status: Married    Spouse name: Simona Huh  . Number of children: 2  . Years of education: 20  . Highest education level: Not on file  Occupational History  . Not on file  Social Needs  . Financial resource strain: Not on file  . Food insecurity:    Worry: Not on file    Inability: Not on file  . Transportation  needs:    Medical: Not on file    Non-medical: Not on file  Tobacco Use  . Smoking status: Never Smoker  . Smokeless tobacco: Never Used  Substance and Sexual Activity  . Alcohol use: Yes    Comment: occasional beer  . Drug use: No  . Sexual activity: Not on file  Lifestyle  . Physical activity:    Days per week: Not on file    Minutes per session: Not on file  . Stress: Not on file  Relationships  . Social connections:    Talks on phone: Not on file    Gets together: Not on file    Attends religious service: Not on  file    Active member of club or organization: Not on file    Attends meetings of clubs or organizations: Not on file    Relationship status: Not on file  Other Topics Concern  . Not on file  Social History Narrative   Lives at home with husband and daughter   Caffeine use: soda daily   Right handed   Family History  Problem Relation Age of Onset  . Heart disease Father   . Migraines Neg Hx    Scheduled Meds: . chlorhexidine  15 mL Mouth Rinse BID  . feeding supplement (PRO-STAT SUGAR FREE 64)  30 mL Per Tube Daily  . free water  100 mL Per Tube Q8H  . gabapentin  300 mg Oral TID  . gemfibrozil  600 mg Oral BID AC  . insulin aspart  0-15 Units Subcutaneous TID WC  . insulin aspart  0-5 Units Subcutaneous QHS  . lacosamide  200 mg Per Tube BID  . levETIRAcetam  1,500 mg Per Tube BID  . LORazepam  1 mg Intravenous Once  . mouth rinse  15 mL Mouth Rinse q12n4p  . metoprolol tartrate  25 mg Oral BID   Continuous Infusions: . sodium chloride 10 mL/hr at 07/28/18 2209  . feeding supplement (JEVITY 1.2 CAL) 1,000 mL (07/29/18 0335)  . heparin 950 Units/hr (07/29/18 1006)   PRN Meds:.[DISCONTINUED] acetaminophen **OR** acetaminophen (TYLENOL) oral liquid 160 mg/5 mL **OR** acetaminophen, labetalol, ondansetron (ZOFRAN) IV Medications Prior to Admission:  Prior to Admission medications   Medication Sig Start Date End Date Taking? Authorizing Provider    busPIRone (BUSPAR) 5 MG tablet Take 5 mg by mouth 3 (three) times daily. 03/12/18  Yes [provider]  CARTIA XT 180 MG 24 hr capsule Take 180 mg by mouth daily. 05/13/18  Yes [provider]  cloNIDine (CATAPRES) 0.1 MG tablet Take 0.1 mg by mouth 2 (two) times daily. 03/31/18  Yes [provider]  clopidogrel (PLAVIX) 75 MG tablet Take 1 tablet (75 mg total) by mouth daily with breakfast. 07/09/17  Yes Love, Ivan Anchors, PA-C  diclofenac sodium (VOLTAREN) 1 % GEL Apply 2 application topically 4 (four) times daily as needed (pain).    Yes [provider]  esomeprazole (NEXIUM) 40 MG capsule Take 40 mg by mouth daily as needed (acid reflux). Acid reflux   Yes [provider]  insulin glargine (LANTUS) 100 unit/mL SOPN Inject 0.2 mLs (20 Units total) into the skin at bedtime. Patient taking differently: Inject 22 Units into the skin at bedtime.  07/08/17  Yes Love, Ivan Anchors, PA-C  metFORMIN (GLUMETZA) 500 MG (MOD) 24 hr tablet Take 500 mg by mouth daily.    Yes [provider]  metoprolol succinate (TOPROL-XL) 100 MG 24 hr tablet Take 100 mg by mouth 2 (two) times daily.    Yes [provider]  PRESCRIPTION MEDICATION Take 20 mg by mouth See admin instructions. Domperidone 10 mg from [place]: Take 2 tablets (20 mg) by mouth three times daily with meals - for diabetic gastroparesis   Yes [provider]  prochlorperazine (COMPAZINE) 5 MG tablet Take 1 tablet (5 mg total) by mouth every 8 (eight) hours as needed for nausea or vomiting. Have been taking it with meals--wean as able Patient taking differently: Take 5 mg by mouth every 8 (eight) hours as needed for nausea or vomiting.  07/08/17  Yes Love, Ivan Anchors, PA-C  ranitidine (ZANTAC) 75 MG tablet Take 75-150 mg by  mouth daily.  03/07/18  Yes [provider]  sertraline (ZOLOFT) 50 MG tablet Take 1 tablet (50 mg total) by mouth daily. 05/18/18  Yes Melvenia Beam, MD  tiZANidine  (ZANAFLEX) 4 MG tablet Take 4 mg by mouth 2 (two) times daily as needed. 06/30/18  Yes [provider]  topiramate (TOPAMAX) 50 MG tablet Take 1 tablet (50 mg total) by mouth 2 (two) times daily. 07/08/17  Yes Love, Ivan Anchors, PA-C  traMADol (ULTRAM) 50 MG tablet Take 50 mg by mouth 3 (three) times daily as needed for pain. 07/05/18  Yes [provider]  VICTOZA 18 MG/3ML SOPN Inject 1.2 mg into the skin at bedtime.  05/02/18  Yes [provider]  ACCU-CHEK COMPACT PLUS test strip  01/18/18   [provider]  acetaZOLAMIDE (DIAMOX) 250 MG tablet Take 1 tablet (250 mg total) by mouth 3 (three) times daily. 07/16/17   Melvenia Beam, MD   Allergies  Allergen Reactions  . Carbamazepine Hives, Shortness Of Breath and Other (See Comments)  . Penicillins Anaphylaxis, Other (See Comments) and Swelling    Mother, father and brother have history of anaphylaxis reaction to penicillin so pt does not take  My throat swells Familial history of anaphylaxis   . Statins Other (See Comments)    Severe muscle weakness, leg numbness, severe headaches, chest pain   . Tricyclic Antidepressants Other (See Comments)    IMPAIRED MEMORY  . Atorvastatin Other (See Comments)    Severe muscle weakness, leg numbness, severe headaches, chest pain   . Ezetimibe Other (See Comments)    Severe muscle weakness, leg numbness, severe headaches, chest pain   . Nortriptyline Other (See Comments)    IMPAIRED MEMORY  . Quinapril Hcl Other (See Comments)    Unknown reaction  . Rifampin Diarrhea  . Metoclopramide Nausea And Vomiting and Rash   Review of Systems  Physical Exam  Constitutional:  frail  Cardiovascular: Normal rate.  Pulmonary/Chest:  Audible secretions  Neurological:  lethargic  Skin: There is pallor.  Psychiatric:  Flat affect  Nursing note and vitals reviewed.   Vital Signs: BP (!) 155/61   Pulse 97   Temp 98 F (36.7 C) (Oral)   Resp 20   Ht 5' 2"  (1.575 m)    Wt 58.9 kg   SpO2 97%   BMI 23.75 kg/m  Pain Scale: 0-10 POSS *See Group Information*: 1-Acceptable,Awake and alert Pain Score: 0-No pain   SpO2: SpO2: 97 % O2 Device:SpO2: 97 % O2 Flow Rate: .O2 Flow Rate (L/min): 4 L/min  IO: Intake/output summary: No intake or output data in the 24 hours ending 07/29/18 1256  LBM: Last BM Date: 07/29/18 Baseline Weight: Weight: 59 kg Most recent weight: Weight: 58.9 kg     Palliative Assessment/Data: PPS: 20%     Thank you for this consult. Palliative medicine will continue to follow and assist as needed.   Time In: 1000 Time Out: 1300 Time Total: 120 mins Prolonged services billed: yes Greater than 50%  of this time was spent counseling and coordinating care related to the above assessment and plan.  Signed by: Mariana Kaufman, AGNP-C Palliative Medicine    Please contact Palliative Medicine Team phone at [phone] for questions and concerns.  For individual provider: See Shea Evans

## 2018-07-29 NOTE — Progress Notes (Addendum)
Neurology Progress Note   S:// Seen and examined.  A couple brief jerking movements of the hand and tissues of the face yesterday but no alteration of awareness during these episodes. Family is concerned that patient has been more depressed, as she had some underlying depression even prior to this presentation with the stroke and seizure.   O:// Current vital signs: BP (!) 177/92 (BP Location: Right Arm)   Pulse 99   Temp 98.3 F (36.8 C) (Oral)   Resp (!) 21   Ht 5\' 2"  (1.575 m)   Wt 58.9 kg   SpO2 98%   BMI 23.75 kg/m  Vital signs in last 24 hours: Temp:  [97.6 F (36.4 C)-98.3 F (36.8 C)] 98.3 F (36.8 C) (10/11 0842) Pulse Rate:  [99-105] 99 (10/11 0842) Resp:  [15-21] 21 (10/11 0842) BP: (125-177)/(67-95) 177/92 (10/11 0842) SpO2:  [96 %-98 %] 98 % (10/11 0842) Weight:  [58.9 kg] 58.9 kg (10/11 0600) General: Awake alert in no apparent distress HEENT: Cephalic, atraumatic, NG tube in place. CVS: S1-S2 heard regular rate rhythm Respiration: Scattered rales all over Abdomen: Nondistended nontender Neurological exam Patient awake, alert, oriented to self, place but not to time. Her speech is mildly dysarthric Poor attention concentration Naming, comprehension and repetition are intact Pupils are equal round react light, extra ocular movements intact, unclear if she has a field cut but blinks to threat from both sides, smile symmetric, tongue midline. Motor exam: She is antigravity in both upper extremities with mild right upper extremity weakness.  She is on regarding both legs with minimal right leg weakness. Sensory exam: Intact Cerebellar: Normal finger-nose  Medications  Current Facility-Administered Medications:  .  0.9 %  sodium chloride infusion, , Intravenous, Continuous, Cherene Altes, MD, Last Rate: 10 mL/hr at 07/28/18 2209 .  [DISCONTINUED] acetaminophen (TYLENOL) tablet 650 mg, 650 mg, Oral, Q4H PRN, 650 mg at 07/16/18 2056 **OR** acetaminophen  (TYLENOL) solution 650 mg, 650 mg, Per Tube, Q4H PRN, 650 mg at 07/28/18 1821 **OR** acetaminophen (TYLENOL) suppository 650 mg, 650 mg, Rectal, Q4H PRN, Doutova, Anastassia, MD, 650 mg at 07/24/18 1225 .  chlorhexidine (PERIDEX) 0.12 % solution 15 mL, 15 mL, Mouth Rinse, BID, Agarwala, Ravi, MD, 15 mL at 07/29/18 0812 .  feeding supplement (JEVITY 1.2 CAL) liquid 1,000 mL, 1,000 mL, Per Tube, Continuous, Agarwala, Ravi, MD, Last Rate: 60 mL/hr at 07/29/18 0335, 1,000 mL at 07/29/18 0335 .  feeding supplement (PRO-STAT SUGAR FREE 64) liquid 30 mL, 30 mL, Per Tube, Daily, Cyndia Skeeters, Taye T, MD, 30 mL at 07/29/18 0812 .  free water 100 mL, 100 mL, Per Tube, Q8H, Gonfa, Taye T, MD, 100 mL at 07/29/18 0607 .  gabapentin (NEURONTIN) capsule 300 mg, 300 mg, Oral, TID, Minor, Grace Bushy, NP, 300 mg at 07/29/18 0813 .  gemfibrozil (LOPID) tablet 600 mg, 600 mg, Oral, BID AC, Emokpae, Courage, MD, 600 mg at 07/29/18 0813 .  heparin ADULT infusion 100 units/mL (25000 units/222mL sodium chloride 0.45%), 950 Units/hr, Intravenous, Continuous, Skeet Simmer, [facility], Last Rate: 9.5 mL/hr at 07/28/18 0315, 950 Units/hr at 07/28/18 0315 .  insulin aspart (novoLOG) injection 0-15 Units, 0-15 Units, Subcutaneous, TID WC, Mercy Riding, MD, 5 Units at 07/29/18 0654 .  insulin aspart (novoLOG) injection 0-5 Units, 0-5 Units, Subcutaneous, QHS, Wendee Beavers T, MD, 2 Units at 07/28/18 2238 .  labetalol (NORMODYNE,TRANDATE) injection 10 mg, 10 mg, Intravenous, Q2H PRN, Etheleen Nicks, MD, 10 mg at 07/26/18 1906 .  lacosamide (VIMPAT) 200 mg in sodium chloride 0.9 % 25 mL IVPB, 200 mg, Intravenous, Q12H, Greta Doom, MD, Last Rate: 90 mL/hr at 07/29/18 0820, 200 mg at 07/29/18 0820 .  levETIRAcetam (KEPPRA) IVPB 1500 mg/ 100 mL premix, 1,500 mg, Intravenous, Q12H, Amie Portland, MD, Last Rate: 400 mL/hr at 07/28/18 2216, 1,500 mg at 07/28/18 2216 .  LORazepam (ATIVAN) injection 1 mg, 1 mg, Intravenous, Once, Marliss Coots, PA-C .  MEDLINE mouth rinse, 15 mL, Mouth Rinse, q12n4p, Agarwala, Ravi, MD, 15 mL at 07/28/18 1623 .  metoprolol tartrate (LOPRESSOR) tablet 25 mg, 25 mg, Oral, BID, Minor, Grace Bushy, NP, 25 mg at 07/29/18 7048 .  ondansetron (ZOFRAN) injection 4 mg, 4 mg, Intravenous, Q6H PRN, Bodenheimer, Charles A, NP, 4 mg at 07/27/18 1806 Labs CBC    Component Value Date/Time   WBC 8.7 07/29/2018 0339   RBC 3.36 (L) 07/29/2018 0339   HGB 9.2 (L) 07/29/2018 0339   HCT 30.1 (L) 07/29/2018 0339   PLT 399 07/29/2018 0339   MCV 89.6 07/29/2018 0339   MCH 27.4 07/29/2018 0339   MCHC 30.6 07/29/2018 0339   RDW 15.4 07/29/2018 0339   LYMPHSABS 1.2 07/25/2018 0736   MONOABS 0.4 07/25/2018 0736   EOSABS 0.0 07/25/2018 0736   BASOSABS 0.0 07/25/2018 0736    CMP     Component Value Date/Time   NA 141 07/29/2018 0339   NA 137 01/15/2016 1632   K 4.1 07/29/2018 0339   CL 104 07/29/2018 0339   CO2 28 07/29/2018 0339   GLUCOSE 152 (H) 07/29/2018 0339   BUN 16 07/29/2018 0339   BUN 14 01/15/2016 1632   CREATININE 0.63 07/29/2018 0339   CALCIUM 8.8 (L) 07/29/2018 0339   PROT 6.3 (L) 07/27/2018 0548   ALBUMIN 2.8 (L) 07/27/2018 0548   AST 19 07/27/2018 0548   ALT 16 07/27/2018 0548   ALKPHOS 72 07/27/2018 0548   BILITOT 0.4 07/27/2018 0548   GFRNONAA >60 07/29/2018 0339   GFRAA >60 07/29/2018 0339    Assessment:  67 year old with this admission for 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 She has been on Keppra, Vimpat, Neurontin and Klonopin and has had off-and-on right arm jerking concerning for breakthrough seizures. Over the past 24 hours, she has had some intermittent right arm jerking but no episode with alteration of awareness. Overall, she is stable from a neurological  standpoint. Seen again by vascular surgery who planned an outpatient evaluation in 6 weeks or so for possible left CEA.  Impression: Stroke Seizure Toxic metabolic encephalopathy  Recommendations: I will continue the current antiepileptic regimen -Keppra 1500 twice daily -Vimpat 200 twice daily -Neurontin 300 3 times daily -Klonopin 0.25 twice daily Consider Depakote if clinical or EEG seizures recur.  EEG has been negative for seizures even with clinical seizure like picture indicating that this might have a deeper focus than what EEG can pick up. Continue her on heparin until the decision has been made for any surgical procedure like PEG tube placement.  Heparin should then be switched over to Eliquis for the left ICA thrombus. She should follow-up with vascular surgery in 6 weeks.  Neurology will be available as needed.  Please call with questions  -- Amie Portland, MD [facility] Pager: [phone] If 7pm  to 7am, please call on call as listed on AMION.   ADDENDUM Family had requested possibly psychiatry eval I will relay this request to the primary team. He has not demonstrated exhibited any suicidal or homicidal ideation  -- Amie Portland, MD [facility] Pager: [phone] If 7pm to 7am, please call on call as listed on AMION.

## 2018-07-29 NOTE — Progress Notes (Signed)
ANTICOAGULATION CONSULT NOTE - Follow Up Consult  Pharmacy Consult for Heparin  Indication: CVA and carotid thrombus  Allergies  Allergen Reactions  . Carbamazepine Hives, Shortness Of Breath and Other (See Comments)  . Penicillins Anaphylaxis, Other (See Comments) and Swelling    Mother, father and brother have history of anaphylaxis reaction to penicillin so pt does not take  My throat swells Familial history of anaphylaxis   . Statins Other (See Comments)    Severe muscle weakness, leg numbness, severe headaches, chest pain   . Tricyclic Antidepressants Other (See Comments)    IMPAIRED MEMORY  . Atorvastatin Other (See Comments)    Severe muscle weakness, leg numbness, severe headaches, chest pain   . Ezetimibe Other (See Comments)    Severe muscle weakness, leg numbness, severe headaches, chest pain   . Nortriptyline Other (See Comments)    IMPAIRED MEMORY  . Quinapril Hcl Other (See Comments)    Unknown reaction  . Rifampin Diarrhea  . Metoclopramide Nausea And Vomiting and Rash    Patient Measurements: Height: 5\' 2"  (157.5 cm) Weight: 129 lb 13.6 oz (58.9 kg) IBW/kg (Calculated) : 50.1 Heparin Dosing Weight:  60.1 kg  Vital Signs: Temp: 98.3 F (36.8 C) (10/11 0842) Temp Source: Oral (10/11 0842) BP: 177/92 (10/11 0842) Pulse Rate: 99 (10/11 0842)  Labs: Recent Labs    07/27/18 0548 07/28/18 0429 07/29/18 0339  HGB 9.6* 8.8* 9.2*  HCT 32.4* 30.2* 30.1*  PLT 395 398 399  HEPARINUNFRC 0.27* 0.42 0.34  CREATININE 0.72  --  0.63    Estimated Creatinine Clearance: 54.7 mL/min (by C-G formula based on SCr of 0.63 mg/dL).  Assessment:  Anticoag: Heparin for carotid thrombus, CVA.  CBC stable Heparin level therapeutic   Goal of Therapy:  Heparin level 0.3-0.5 units/ml Monitor platelets by anticoagulation protocol: Yes   Plan:  Continue heparin at 950 units / hr Daily heparin level, CBC  Thank you Anette Guarneri, PharmD [phone] 07/29/2018  10:28 AM

## 2018-07-29 NOTE — Progress Notes (Signed)
[name] TEAM 1 - Stepdown/ICU TEAM  [name]  NWG:[id number] DOB: Jun 18, 1951 DOA: 07/13/2018 PCP: [name], MD    Brief Narrative:  67 year old F w/ a history of prior CVAs with carotid artery disease, hypertension, hyperlipidemia, normal pressure hydrocephalus status post shunt who was admitted with seizures and a new left MCA territory stroke.  Treated with heparin and antiplatelets. Had recurrent seizures and transferred to ICU 9/29 for Versed and seizure suppression. Intubated and ventilated 9/29.  Significant Events: 9/25 Admit  9/29 To ICU for burst suppression  10/6 extubated - post extubation stridor treated with racemic epi, heliox, BiPAP 10/7 Off BiPAP 10/8 TRH assumed care   Subjective: Very flat affect. Denies any complaints. No sob, n/v, abdom pain.   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  Care as per Neurology - appears to be controlled at this time  Possible myoclonus right arm controlled at time of my visit today   Acute respiratory failure due to AMS and inability to protect airway Resolved w/ pt now on minimal O2 support   NPH s/p shunt placement   HTN Follow w/o change in tx for now   HLD Cont lopid  DM A1c 6.3 - CBG climbing - adjust tx and follow   Dysphagia  SLP following - cont cortrak feeding for now - if has not improved on SLP re-eval early next week will need   DVT prophylaxis: IV heparin  Code Status: DNR - NO CODE Family Communication: spoke w/ daughter at bedside  Disposition Plan: stable for med/surg bed - cont Cortrak feeds over weekend and reassess swallow early next week  Consultants:  PCCM Neurology  Antimicrobials:  None      Objective: Blood pressure (!) 155/61, pulse 97, temperature 98 F (36.7 C), temperature source Oral, resp. rate 20, height 5\' 2"  (1.575 m), weight 58.9 kg, SpO2 97 %. No intake or output data in the 24 hours ending 07/29/18 1511 Filed Weights   07/27/18 0356 07/28/18 0353 07/29/18 0600  Weight: 56.8 kg 56.6 kg 58.9 kg    Examination: General: NAD - very flat affect  Lungs: no focal crackles or wheezing  Cardiovascular: RRR - no M  Abdomen: NT/ND, soft, BS+ Extremities: trace edema B LE   CBC: Recent Labs  Lab 07/24/18 0347 07/25/18 0736  07/27/18 0548 07/28/18 0429 07/29/18 0339  WBC 7.2 8.4   < > 12.0* 10.9* 8.7  NEUTROABS 4.4 6.7  --   --   --   --   HGB 8.8* 11.2*   < > 9.6* 8.8* 9.2*  HCT 28.8* 36.1   < > 32.4* 30.2* 30.1*  MCV 90.0 89.4   < > 90.8 90.1 89.6  PLT 246 339   < > 395 398 399   < > = values in this interval not displayed.   Basic Metabolic Panel: Recent Labs  Lab 07/24/18 0347  07/25/18 0453 07/26/18 0353 07/27/18 0548 07/29/18 0339  NA 140   < > 141 142 139 141  K 3.6   < > 3.8 3.5 3.7 4.1  CL 106   < > 97* 101 102 104  CO2 26   < > 27 31 26  28  GLUCOSE 160*   < > 160* 266* 99 152*  BUN 10   < > 15 19 15 16   CREATININE 0.72   < > 0.92 0.89 0.72 0.63  CALCIUM 8.9   < > 9.8 9.0 8.7* 8.8*  MG 1.8  --  1.8 2.0  --   --   PHOS 3.2  --  5.9* 3.1  --   --    < > = values in this interval not displayed.   GFR: Estimated Creatinine Clearance: 54.7 mL/min (by C-G formula based on SCr of 0.63 mg/dL).  Liver Function Tests: Recent Labs  Lab 07/27/18 0548  AST 19  ALT 16  ALKPHOS 72  BILITOT 0.4  PROT 6.3*  ALBUMIN 2.8*    Recent Labs  Lab 07/27/18 0912  AMMONIA 34    HbA1C: Hgb A1c MFr Bld  Date/Time Value Ref Range Status  07/13/2018 11:53 PM 6.3 (H) 4.8 - 5.6 % Final    Comment:    (NOTE) Pre diabetes:          5.7%-6.4% Diabetes:              >6.4% Glycemic control for   <7.0% adults with diabetes   06/02/2017 07:41 AM  9.5 (H) 4.8 - 5.6 % Final    Comment:    (NOTE)         Prediabetes: 5.7 - 6.4         Diabetes: >6.4         Glycemic control for adults with diabetes: <7.0     CBG: Recent Labs  Lab 07/28/18 2000 07/28/18 2358 07/29/18 0644 07/29/18 0755 07/29/18 1211  GLUCAP 207* 167* 245* 218* 239*     Scheduled Meds: . chlorhexidine  15 mL Mouth Rinse BID  . feeding supplement (PRO-STAT SUGAR FREE 64)  30 mL Per Tube Daily  . free water  100 mL Per Tube Q8H  . gabapentin  300 mg Oral TID  . gemfibrozil  600 mg Oral BID AC  . insulin aspart  0-15 Units Subcutaneous TID WC  . insulin aspart  0-5 Units Subcutaneous QHS  . lacosamide  200 mg Per Tube BID  . levETIRAcetam  1,500 mg Per Tube BID  . LORazepam  1 mg Intravenous Once  . mouth rinse  15 mL Mouth Rinse q12n4p  . metoprolol tartrate  25 mg Oral BID   Continuous Infusions: . sodium chloride 10 mL/hr at 07/28/18 2209  . feeding supplement (JEVITY 1.2 CAL) 1,000 mL (07/29/18 0335)  . heparin 950 Units/hr (07/29/18 1006)     LOS: 5 days   Cherene Altes, MD [facility] Office  [phone] Pager - Text Page per Amion  If 7PM-7AM, please contact night-coverage per Amion 07/29/2018, 3:11 PM

## 2018-07-29 NOTE — Telephone Encounter (Signed)
sch appt lvm mld ltr 09/23/18 2pm Judie Grieve MD

## 2018-07-29 NOTE — Progress Notes (Signed)
Results for CHANIQUE, DUCA (MRN [id number]) as of 07/29/2018 12:20  Ref. Range 07/28/2018 20:00 07/28/2018 23:58 07/29/2018 06:44 07/29/2018 07:55 07/29/2018 12:11  Glucose-Capillary Latest Ref Range: 70 - 99 mg/dL 207 (H) 167 (H) 245 (H) 218 (H) 239 (H)   Inpatient Diabetes Program Recommendations  AACE/ADA: New Consensus Statement on Inpatient Glycemic Control (2015)  Target Ranges:  Prepandial:   less than 140 mg/dL      Peak postprandial:   less than 180 mg/dL (1-2 hours)      Critically ill patients:  140 - 180 mg/dL   Lab Results  Component Value Date   GLUCAP 239 (H) 07/29/2018   HGBA1C 6.3 (H) 07/13/2018    Review of Glycemic Control  Diabetes history: Type 2 Outpatient Diabetes medications: Lantus 22 units daily, Metformin, Victoza Current orders for Inpatient glycemic control: Novolog 0-15 units TID  & HS/  Inpatient Diabetes Program Recommendations:    Noted that blood sugars continue to be greater than 180 mg/dl. If blood sugars continue to be elevated, recommend starting Lantus 10 units daily (1/2 of home dose) and continue Novolog as ordered. Titrate dosage as needed.     Harvel Ricks RN BSN CDE Diabetes Coordinator Pager: [phone]  8am-5pm

## 2018-07-30 DIAGNOSIS — Z4659 Encounter for fitting and adjustment of other gastrointestinal appliance and device: Secondary | ICD-10-CM

## 2018-07-30 LAB — CBC
HEMATOCRIT: 32.6 % — AB (ref 36.0–46.0)
HEMOGLOBIN: 10 g/dL — AB (ref 12.0–15.0)
MCH: 27.5 pg (ref 26.0–34.0)
MCHC: 30.7 g/dL (ref 30.0–36.0)
MCV: 89.8 fL (ref 80.0–100.0)
NRBC: 0 % (ref 0.0–0.2)
Platelets: 437 10*3/uL — ABNORMAL HIGH (ref 150–400)
RBC: 3.63 MIL/uL — AB (ref 3.87–5.11)
RDW: 15.2 % (ref 11.5–15.5)
WBC: 9.7 10*3/uL (ref 4.0–10.5)

## 2018-07-30 LAB — GLUCOSE, CAPILLARY
GLUCOSE-CAPILLARY: 161 mg/dL — AB (ref 70–99)
GLUCOSE-CAPILLARY: 290 mg/dL — AB (ref 70–99)
Glucose-Capillary: 247 mg/dL — ABNORMAL HIGH (ref 70–99)
Glucose-Capillary: 300 mg/dL — ABNORMAL HIGH (ref 70–99)
Glucose-Capillary: 182 mg/dL — ABNORMAL HIGH (ref 70–99)
Glucose-Capillary: 189 mg/dL — ABNORMAL HIGH (ref 70–99)

## 2018-07-30 LAB — HEPARIN LEVEL (UNFRACTIONATED): Heparin Unfractionated: 0.4 IU/mL (ref 0.30–0.70)

## 2018-07-30 NOTE — Progress Notes (Signed)
Patient able to tolerate ice cream, ate 60 ml vanilla ice cream.  Patient continues to have congested cough with moderate secretion.  Mouthcare performed and able to suction secretions.

## 2018-07-30 NOTE — Progress Notes (Signed)
ANTICOAGULATION CONSULT NOTE - Follow Up Consult  Pharmacy Consult for heparin Indication:  CVA and carotid thrombus  Allergies  Allergen Reactions  . Carbamazepine Hives, Shortness Of Breath and Other (See Comments)  . Penicillins Anaphylaxis, Other (See Comments) and Swelling    Mother, father and brother have history of anaphylaxis reaction to penicillin so pt does not take  My throat swells Familial history of anaphylaxis   . Statins Other (See Comments)    Severe muscle weakness, leg numbness, severe headaches, chest pain   . Tricyclic Antidepressants Other (See Comments)    IMPAIRED MEMORY  . Atorvastatin Other (See Comments)    Severe muscle weakness, leg numbness, severe headaches, chest pain   . Ezetimibe Other (See Comments)    Severe muscle weakness, leg numbness, severe headaches, chest pain   . Nortriptyline Other (See Comments)    IMPAIRED MEMORY  . Quinapril Hcl Other (See Comments)    Unknown reaction  . Rifampin Diarrhea  . Metoclopramide Nausea And Vomiting and Rash    Patient Measurements: Height: 5\' 2"  (157.5 cm) Weight: 129 lb 13.6 oz (58.9 kg) IBW/kg (Calculated) : 50.1 Heparin Dosing Weight:59kg  Vital Signs: Temp: 99 F (37.2 C) (10/12 0340) Temp Source: Oral (10/12 0340) BP: 146/76 (10/12 0340) Pulse Rate: 95 (10/12 0340)  Labs: Recent Labs    07/28/18 0429 07/29/18 0339 07/30/18 0352  HGB 8.8* 9.2* 10.0*  HCT 30.2* 30.1* 32.6*  PLT 398 399 437*  HEPARINUNFRC 0.42 0.34 0.40  CREATININE  --  0.63  --     Estimated Creatinine Clearance: 54.7 mL/min (by C-G formula based on SCr of 0.63 mg/dL).   Medications:  Scheduled:  . chlorhexidine  15 mL Mouth Rinse BID  . feeding supplement (PRO-STAT SUGAR FREE 64)  30 mL Per Tube Daily  . free water  100 mL Per Tube Q8H  . gabapentin  300 mg Oral TID  . gemfibrozil  600 mg Per Tube BID AC  . insulin aspart  0-15 Units Subcutaneous TID WC  . insulin aspart  0-5 Units Subcutaneous QHS   . insulin detemir  10 Units Subcutaneous QHS  . lacosamide  200 mg Per Tube BID  . levETIRAcetam  1,500 mg Per Tube BID  . mouth rinse  15 mL Mouth Rinse q12n4p  . metoprolol tartrate  25 mg Per Tube BID    Assessment: 15 yof continues on IV heparin for carotid thrombus and CVS. Heparin level therapeutic this AM at 0.40, will continue current regimen. Hgb stable, platelets stable. No signs/symptoms of bleeding noted.   Goal of Therapy:  Heparin level 0.3 - 0.5 units/mL Monitor platelets by anticoagulation protocol: Yes   Plan:  Continue heparin at 950 units / hr Daily heparin level, CBC    Thank you for allowing pharmacy to be a part of this patient's care. Tamela Gammon, PharmD 07/30/2018 8:00 AM PGY-1 Pharmacy Resident Phone: [phone] Please check [email or web address] for unit-specific pharmacist phone numbers after 3:00 P.M.

## 2018-07-30 NOTE — Progress Notes (Signed)
Notified attending of congested cough and headache.

## 2018-07-30 NOTE — Progress Notes (Signed)
Daily Progress Note   Patient Name: [name]       Date: 07/30/2018 DOB: December 09, 1950  Age: 67 y.o. MRN#: [id number] Attending Physician: Donne Hazel, MD Primary Care Physician: [name], MD Admit Date: 07/13/2018  Reason for Consultation/Follow-up: Establishing goals of care  Subjective: Met with spouse and daughter this morning. Patient sleeping. They had further discussion of all options last night. Patient desires to continue with full aggressive care, including placement of feeding tube if this is needed. She noted to family that her [place] would be to try and get back to at least 50% of where she was prior to admission.  Discussed with family anticipatory care- including possible development of pneumonia despite feeding tube, further decline or failure to advance with rehab. Discussed options that would be available in all possible scenarios.   ROS  Length of Stay: 17  Current Medications: Scheduled Meds:  . chlorhexidine  15 mL Mouth Rinse BID  . feeding supplement (PRO-STAT SUGAR FREE 64)  30 mL Per Tube Daily  . free water  100 mL Per Tube Q8H  . gabapentin  300 mg Oral TID  . gemfibrozil  600 mg Per Tube BID AC  . insulin aspart  0-15 Units Subcutaneous TID WC  . insulin aspart  0-5 Units Subcutaneous QHS  . insulin detemir  10 Units Subcutaneous QHS  . lacosamide  200 mg Per Tube BID  . levETIRAcetam  1,500 mg Per Tube BID  . mouth rinse  15 mL Mouth Rinse q12n4p  . metoprolol tartrate  25 mg Per Tube BID    Continuous Infusions: . sodium chloride 10 mL/hr at 07/28/18 2209  . feeding supplement (JEVITY 1.2 CAL) 1,000 mL (07/30/18 0300)  . heparin 950 Units/hr (07/30/18 0100)    PRN Meds: [DISCONTINUED] acetaminophen **OR** acetaminophen (TYLENOL) oral  liquid 160 mg/5 mL **OR** [DISCONTINUED] acetaminophen, labetalol, ondansetron (ZOFRAN) IV  Physical Exam          Vital Signs: BP (!) 146/76 (BP Location: Left Arm)   Pulse 100   Temp 98.6 F (37 C) (Oral)   Resp 18   Ht _0  (1.575 m)   Wt 58.9 kg   SpO2 97%   BMI 23.75 kg/m  SpO2: SpO2: 97 % O2 Device: O2 Device: Room Air O2 Flow Rate:  O2 Flow Rate (L/min): 4 L/min  Intake/output summary:   Intake/Output Summary (Last 24 hours) at 07/30/2018 1036 Last data filed at 07/30/2018 0600 Gross per 24 hour  Intake 3203.09 ml  Output -  Net 3203.09 ml   LBM: Last BM Date: 07/29/18 Baseline Weight: Weight: 59 kg Most recent weight: Weight: 58.9 kg       Palliative Assessment/Data:      Patient Active Problem List   Diagnosis Date Noted  . Advanced care planning/counseling discussion   . Goals of care, counseling/discussion   . Status epilepticus ([place])   . SOB (shortness of breath)   . Adjustment disorder with depressed mood 11/02/2017  . Adjustment disorder with mixed anxiety and depressed mood   . Hemoptysis   . Hematemesis   . Intractable vomiting with nausea   . Acute lower UTI   . Urinary retention   . Diabetic gastroparesis ([place])   . Vascular headache   . Diabetes mellitus ([place])   . CVA (cerebral vascular accident) ([place]) 06/18/2017  . Weakness   . Cerebral infarction due to embolism of left carotid artery ([place])   . Right hemiparesis ([place])   . Encounter for intubation 06/16/2017  . Palliative care by specialist 06/16/2017  . Anxiety state   . Acute embolic stroke ([place])   . Benign essential HTN   . Diabetic peripheral neuropathy ([place])   . Pseudotumor cerebri   . Hypokalemia   . Seizure ([place])   . Leukocytosis   . Acute blood loss anemia   . Post-operative pain   . Encounter for feeding tube placement   . Respiratory failure ([place])   . Status post tracheostomy ([place])   . Acute respiratory failure with hypoxemia ([place])   . Stenosis of left carotid  artery   . Acute CVA (cerebrovascular accident) ([place]) 05/31/2017  . Type II diabetes mellitus with complication ([place]) 21/22/4825  . Hypertension 05/31/2017  . Hyponatremia 05/31/2017  . IIH (idiopathic intracranial hypertension) 01/18/2016  . Malfunction of ventriculo-peritoneal shunt ([place]) 01/18/2016  . 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 Worsening headaches 01/18/2016  . Increased abdominal girth 01/18/2016  . Abdominal fluid collection 01/18/2016  . Vision changes 01/18/2016  . Perceived hearing changes 01/18/2016  . Nausea without vomiting 01/18/2016  . Peripheral vision loss 01/18/2016  . Imbalance 01/18/2016  . Falls 01/18/2016  . Dysphagia, oropharyngeal 01/18/2016  . Subacute confusional state 01/18/2016  . Left breast mass 05/05/2011    Palliative Care Assessment & Plan   Patient Profile: 67 y.o. 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 During admission she experienced severe seizures requiring voluntary intubation and sedation for control. She has been extubated and is awake. Has ongoing dysphagia and continues to have R sided twitching. Much more alert yesterday and today than previously during admission. Participating some with PT and SLP. High risk for aspiration. Has coretrak placed. Palliative medicine consulted for [place] discussion.  Assessment/Recommendations/Plan   Continue full scope care  Family would like to attempt careful eating and SLP therapy to allow patient to eat for joy, but also continue tube feeding and PEG if needed to supplement nutrition for strength  Wish to pursue CIR placement with GOC to return patient home  PMT will shadow and intervene if needed  Goals of Care and Additional Recommendations:  Limitations on Scope of  Treatment: Full Scope Treatment  Code  Status:  DNR  Prognosis:   Unable to determine  Discharge Planning:  To Be Determined  Care plan was discussed with family  Thank you for allowing the Palliative Medicine Team to assist in the care of this patient.   Time In: 1000 Time Out: 1040 Total Time 40 mins Prolonged Time Billed no      Greater than 50%  of this time was spent counseling and coordinating care related to the above assessment and plan.  Mariana Kaufman, AGNP-C Palliative Medicine   Please contact Palliative Medicine Team phone at [phone] for questions and concerns.

## 2018-07-30 NOTE — Progress Notes (Signed)
Pt's daughter called that pt was having intermittent  twitching on the right arm, pt still was able to respond to calls, pt's daughter was concern, however pt is quiet in bed, NP [place] paged and notified, called back and said to continue to monitor, same related back to the daughter, will however continue to monitor. [place], Omar Orrego Efe

## 2018-07-30 NOTE — Progress Notes (Signed)
PROGRESS NOTE    [name]  ULA:[id number] DOB: 07-27-1951 DOA: 07/13/2018 PCP: [name], MD    Brief Narrative:  67 year old F w/ a history of prior CVAs with carotid artery disease, hypertension, hyperlipidemia, normal pressure hydrocephalus status post shunt who was admitted with seizures and a new left MCA territory stroke. Treated with heparin and antiplatelets. Had recurrent seizures and transferred to ICU 9/29 for Versed and seizure suppression. 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  -Continue care as per Neurology -Appears stable at this time  Possible myoclonus right arm -appears stable at this time  Acute respiratory failure due to AMS and inability to protect airway -Resolved -currently on minimal O2 support  NPH s/p shunt placement   HTN -BP stable at present -Continue to monitor  HLD -Cont lopid as tolerated -Appears stable at present  DM -A1c 6.3 - CBG climbing - adjust tx and follow   Dysphagia  -SLP following - cont cortrak feeding for now - if has not improved on SLP re-eval next wee, then would possibly require PEG -Cont to follow  DVT  prophylaxis: Heparin gtt Code Status: DNR Family Communication: Pt in room, family at bedside Disposition Plan: Possible CIR, pending  Consultants:   Neurology  Procedures:     Antimicrobials: Anti-infectives (From admission, onward)   Start     Dose/Rate Route Frequency Ordered Stop   07/19/18 0000  vancomycin (VANCOCIN) IVPB 1000 mg/200 mL premix  Status:  Discontinued     1,000 mg 200 mL/hr over 60 Minutes Intravenous To Surgery 07/18/18 0725 07/18/18 1148       Subjective: Without complaints  Objective: Vitals:   07/29/18 2340 07/30/18 0340 07/30/18 0800 07/30/18 1138  BP: (!) 159/79 (!) 146/76  (!) 154/78  Pulse: 93 95 100 90  Resp: (!) 21 18 18 18   Temp: 99 F (37.2 C) 99 F (37.2 C) 98.6 F (37 C) (!) 97.4 F (36.3 C)  TempSrc: Oral Oral Oral Oral  SpO2: 96% 97% 97% 98%  Weight:      Height:        Intake/Output Summary (Last 24 hours) at 07/30/2018 1316 Last data filed at 07/30/2018 1100 Gross per 24 hour  Intake 3443.09 ml  Output -  Net 3443.09 ml   Filed Weights   07/27/18 0356 07/28/18 0353 07/29/18 0600  Weight: 56.8 kg 56.6 kg 58.9 kg    Examination:  General exam: Appears calm and comfortable  Respiratory system: Clear to auscultation. Respiratory effort normal. Cardiovascular system: S1 & S2 heard, RRR Gastrointestinal system: Abdomen is nondistended, soft and nontender. No organomegaly or masses felt. Normal bowel sounds heard, NG tube in place Central nervous system: Alert and oriented. No  focal neurological deficits. Extremities: Symmetric 5 x 5 power. Skin: No rashes, lesions Psychiatry: Judgement and insight appear normal. Mood & affect appropriate.   Data Reviewed: I have personally reviewed following labs and imaging studies  CBC: Recent Labs  Lab 07/24/18 0347 07/25/18 0736 07/26/18 0353 07/27/18 0548 07/28/18 0429 07/29/18 0339 07/30/18 0352  WBC 7.2 8.4 10.0 12.0* 10.9* 8.7 9.7  NEUTROABS 4.4 6.7  --   --   --    --   --   HGB 8.8* 11.2* 10.8* 9.6* 8.8* 9.2* 10.0*  HCT 28.8* 36.1 35.1* 32.4* 30.2* 30.1* 32.6*  MCV 90.0 89.4 90.2 90.8 90.1 89.6 89.8  PLT 246 339 379 395 398 399 491*   Basic Metabolic Panel: Recent Labs  Lab 07/24/18 0347 07/24/18 2358 07/25/18 0453 07/26/18 0353 07/27/18 0548 07/29/18 0339  NA 140 143 141 142 139 141  K 3.6 3.2* 3.8 3.5 3.7 4.1  CL 106 98 97* 101 102 104  CO2 26 32 27 31 26 28   GLUCOSE 160* 111* 160* 266* 99 152*  BUN 10 11 15 19 15 16   CREATININE 0.72 0.82 0.92 0.89 0.72 0.63  CALCIUM 8.9 10.1 9.8 9.0 8.7* 8.8*  MG 1.8  --  1.8 2.0  --   --   PHOS 3.2  --  5.9* 3.1  --   --    GFR: Estimated Creatinine Clearance: 54.7 mL/min (by C-G formula based on SCr of 0.63 mg/dL). Liver Function Tests: Recent Labs  Lab 07/27/18 0548  AST 19  ALT 16  ALKPHOS 72  BILITOT 0.4  PROT 6.3*  ALBUMIN 2.8*   No results for input(s): LIPASE, AMYLASE in the last 168 hours. Recent Labs  Lab 07/27/18 0912  AMMONIA 34   Coagulation Profile: No results for input(s): INR, PROTIME in the last 168 hours. Cardiac Enzymes: No results for input(s): CKTOTAL, CKMB, CKMBINDEX, TROPONINI in the last 168 hours. BNP (last 3 results) No results for input(s): PROBNP in the last 8760 hours. HbA1C: No results for input(s): HGBA1C in the last 72 hours. CBG: Recent Labs  Lab 07/29/18 2250 07/29/18 2341 07/30/18 0341 07/30/18 0824 07/30/18 1136  GLUCAP 164* 180* 247* 300* 182*   Lipid Profile: No results for input(s): CHOL, HDL, LDLCALC, TRIG, CHOLHDL, LDLDIRECT in the last 72 hours. Thyroid Function Tests: No results for input(s): TSH, T4TOTAL, FREET4, T3FREE, THYROIDAB in the last 72 hours. Anemia Panel: No results for input(s): VITAMINB12, FOLATE, FERRITIN, TIBC, IRON, RETICCTPCT in the last 72 hours. Sepsis Labs: No results for input(s): PROCALCITON, LATICACIDVEN in the last 168 hours.  No results found for this or any previous visit (from the past 240 hour(s)).    Radiology Studies: No results found.  Scheduled Meds: . chlorhexidine  15 mL Mouth Rinse BID  . feeding supplement (PRO-STAT SUGAR FREE 64)  30 mL Per Tube Daily  . free water  100 mL Per Tube Q8H  . gabapentin  300 mg Oral TID  . gemfibrozil  600 mg Per Tube BID AC  . insulin aspart  0-15 Units Subcutaneous TID WC  . insulin aspart  0-5 Units Subcutaneous QHS  . insulin detemir  10 Units Subcutaneous QHS  . lacosamide  200 mg Per Tube BID  . levETIRAcetam  1,500 mg Per Tube BID  . mouth rinse  15 mL Mouth Rinse q12n4p  . metoprolol tartrate  25 mg Per Tube BID   Continuous Infusions: . sodium chloride 10 mL/hr at 07/28/18 2209  . feeding  supplement (JEVITY 1.2 CAL) 1,000 mL (07/30/18 0300)  . heparin 950 Units/hr (07/30/18 0100)     LOS: 17 days   Marylu Lund, MD [facility] Pager On Amion  If 7PM-7AM, please contact night-coverage 07/30/2018, 1:16 PM

## 2018-07-31 DIAGNOSIS — Z0189 Encounter for other specified special examinations: Secondary | ICD-10-CM

## 2018-07-31 LAB — GLUCOSE, CAPILLARY
GLUCOSE-CAPILLARY: 252 mg/dL — AB (ref 70–99)
GLUCOSE-CAPILLARY: 173 mg/dL — AB (ref 70–99)
Glucose-Capillary: 278 mg/dL — ABNORMAL HIGH (ref 70–99)
Glucose-Capillary: 274 mg/dL — ABNORMAL HIGH (ref 70–99)
Glucose-Capillary: 143 mg/dL — ABNORMAL HIGH (ref 70–99)

## 2018-07-31 LAB — CBC
HCT: 33.5 % — ABNORMAL LOW (ref 36.0–46.0)
Hemoglobin: 10.4 g/dL — ABNORMAL LOW (ref 12.0–15.0)
MCH: 27.4 pg (ref 26.0–34.0)
MCHC: 31 g/dL (ref 30.0–36.0)
MCV: 88.4 fL (ref 80.0–100.0)
PLATELETS: 465 10*3/uL — AB (ref 150–400)
RBC: 3.79 MIL/uL — ABNORMAL LOW (ref 3.87–5.11)
RDW: 15.2 % (ref 11.5–15.5)
WBC: 11.9 10*3/uL — AB (ref 4.0–10.5)
nRBC: 0 % (ref 0.0–0.2)

## 2018-07-31 LAB — HEPARIN LEVEL (UNFRACTIONATED): HEPARIN UNFRACTIONATED: 0.48 [IU]/mL (ref 0.30–0.70)

## 2018-07-31 MED ORDER — INSULIN DETEMIR 100 UNIT/ML ~~LOC~~ SOLN
15.0000 [IU] | Freq: Every day | SUBCUTANEOUS | Status: DC
  Administered 2018-07-31: 15 [IU] via SUBCUTANEOUS
  Filled 2018-07-31 (×2): qty 0.15

## 2018-07-31 MED ORDER — VALPROIC ACID 250 MG/5ML PO SOLN
250.0000 mg | Freq: Four times a day (QID) | ORAL | Status: DC
  Administered 2018-07-31 – 2018-08-01 (×2): 250 mg
  Filled 2018-07-31 (×4): qty 5

## 2018-07-31 MED ORDER — INSULIN ASPART 100 UNIT/ML ~~LOC~~ SOLN
0.0000 [IU] | SUBCUTANEOUS | Status: DC
  Administered 2018-07-31: 3 [IU] via SUBCUTANEOUS
  Administered 2018-08-01 (×2): 5 [IU] via SUBCUTANEOUS
  Administered 2018-08-01: 3 [IU] via SUBCUTANEOUS
  Administered 2018-08-01: 5 [IU] via SUBCUTANEOUS
  Administered 2018-08-01: 3 [IU] via SUBCUTANEOUS
  Administered 2018-08-01: 8 [IU] via SUBCUTANEOUS
  Administered 2018-08-02 (×2): 3 [IU] via SUBCUTANEOUS
  Administered 2018-08-02: 5 [IU] via SUBCUTANEOUS
  Administered 2018-08-02 – 2018-08-03 (×2): 3 [IU] via SUBCUTANEOUS
  Administered 2018-08-03: 5 [IU] via SUBCUTANEOUS
  Administered 2018-08-04 (×2): 3 [IU] via SUBCUTANEOUS
  Administered 2018-08-04: 2 [IU] via SUBCUTANEOUS
  Administered 2018-08-05: 3 [IU] via SUBCUTANEOUS
  Administered 2018-08-05: 2 [IU] via SUBCUTANEOUS
  Administered 2018-08-05 – 2018-08-06 (×2): 3 [IU] via SUBCUTANEOUS
  Administered 2018-08-06: 2 [IU] via SUBCUTANEOUS
  Administered 2018-08-06 – 2018-08-07 (×2): 3 [IU] via SUBCUTANEOUS
  Administered 2018-08-07 (×2): 2 [IU] via SUBCUTANEOUS
  Administered 2018-08-08: 3 [IU] via SUBCUTANEOUS
  Administered 2018-08-08: 2 [IU] via SUBCUTANEOUS
  Administered 2018-08-08 – 2018-08-09 (×3): 3 [IU] via SUBCUTANEOUS

## 2018-07-31 MED ORDER — LORAZEPAM 2 MG/ML IJ SOLN
1.0000 mg | INTRAMUSCULAR | Status: DC | PRN
  Administered 2018-08-11: 1 mg via INTRAVENOUS
  Filled 2018-07-31: qty 1

## 2018-07-31 MED ORDER — DIVALPROEX SODIUM 250 MG PO DR TAB
500.0000 mg | DELAYED_RELEASE_TABLET | Freq: Two times a day (BID) | ORAL | Status: DC
  Administered 2018-07-31: 500 mg via ORAL
  Filled 2018-07-31: qty 2

## 2018-07-31 MED ORDER — VALPROIC ACID 250 MG/5ML PO SOLN
333.0000 mg | Freq: Three times a day (TID) | ORAL | Status: DC
  Filled 2018-07-31 (×2): qty 10

## 2018-07-31 NOTE — Progress Notes (Signed)
ANTICOAGULATION CONSULT NOTE - Follow Up Consult  Pharmacy Consult for heparin Indication:  CVA and carotid thrombus  Allergies  Allergen Reactions  . Carbamazepine Hives, Shortness Of Breath and Other (See Comments)  . Penicillins Anaphylaxis, Other (See Comments) and Swelling    Mother, father and brother have history of anaphylaxis reaction to penicillin so pt does not take  My throat swells Familial history of anaphylaxis   . Statins Other (See Comments)    Severe muscle weakness, leg numbness, severe headaches, chest pain   . Tricyclic Antidepressants Other (See Comments)    IMPAIRED MEMORY  . Atorvastatin Other (See Comments)    Severe muscle weakness, leg numbness, severe headaches, chest pain   . Ezetimibe Other (See Comments)    Severe muscle weakness, leg numbness, severe headaches, chest pain   . Nortriptyline Other (See Comments)    IMPAIRED MEMORY  . Quinapril Hcl Other (See Comments)    Unknown reaction  . Rifampin Diarrhea  . Metoclopramide Nausea And Vomiting and Rash    Patient Measurements: Height: 5\' 2"  (157.5 cm) Weight: 128 lb 15.5 oz (58.5 kg) IBW/kg (Calculated) : 50.1 Heparin Dosing Weight:59kg  Vital Signs: Temp: 97.9 F (36.6 C) (10/13 0407) Temp Source: Oral (10/13 0407) BP: 150/69 (10/12 2348) Pulse Rate: 92 (10/12 2348)  Labs: Recent Labs    07/29/18 0339 07/30/18 0352 07/31/18 0536  HGB 9.2* 10.0* 10.4*  HCT 30.1* 32.6* 33.5*  PLT 399 437* 465*  HEPARINUNFRC 0.34 0.40 0.48  CREATININE 0.63  --   --     Estimated Creatinine Clearance: 54.7 mL/min (by C-G formula based on SCr of 0.63 mg/dL).   Medications:  Scheduled:  . chlorhexidine  15 mL Mouth Rinse BID  . feeding supplement (PRO-STAT SUGAR FREE 64)  30 mL Per Tube Daily  . free water  100 mL Per Tube Q8H  . gabapentin  300 mg Oral TID  . gemfibrozil  600 mg Per Tube BID AC  . insulin aspart  0-15 Units Subcutaneous TID WC  . insulin aspart  0-5 Units Subcutaneous  QHS  . insulin detemir  10 Units Subcutaneous QHS  . lacosamide  200 mg Per Tube BID  . levETIRAcetam  1,500 mg Per Tube BID  . mouth rinse  15 mL Mouth Rinse q12n4p  . metoprolol tartrate  25 mg Per Tube BID    Assessment: 78 yof continues on IV heparin for carotid thrombus and CVS. Heparin level therapeutic this AM at 0.48, will continue current regimen. Hgb stable at 10.4, platelets at 465.  No signs/symptoms of bleeding noted.  No line issues noted.   Goal of Therapy:  Heparin level 0.3 - 0.5 units/mL Monitor platelets by anticoagulation protocol: Yes   Plan:  Continue heparin at 950 units / hr Daily heparin level, CBC Follow-up eliquis transition plans   Thank you for allowing pharmacy to be a part of this patient's care.  Tamela Gammon, PharmD 07/31/2018 7:28 AM PGY-1 Pharmacy Resident Phone: [phone] Please check [email or web address] for unit-specific pharmacist phone numbers after 3:00 P.M.

## 2018-07-31 NOTE — Progress Notes (Signed)
Seen and examined after she had rt sided shaking. Likely seizure. Lasted 30 min. Now exam back to baseline with right hemiparesis and field cut.   RECS: Keppra 1500 BID Vimpat 200 BID Neurontin 300 TID Klonopin 0.25 TID Will add depakote 500 BID (discussed with family that loading dose might sedate her more and now she is DNR)  Please call neurology with questions.  -- Amie Portland, MD [facility] Pager: [phone] If 7pm to 7am, please call on call as listed on AMION.

## 2018-07-31 NOTE — Progress Notes (Signed)
PROGRESS NOTE    [name]  JAS:[id number] DOB: Aug 29, 1951 DOA: 07/13/2018 PCP: [name], MD    Brief Narrative:  67 year old F w/ a history of prior CVAs with carotid artery disease, hypertension, hyperlipidemia, normal pressure hydrocephalus status post shunt who was admitted with seizures and a new left MCA territory stroke. Treated with heparin and antiplatelets. Had recurrent seizures and transferred to ICU 9/29 for Versed and seizure suppression. 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 Glucose in the 200's -Pt is tolerating ice  cream yesterday -Will increase levemir to 15 units  Dysphagia  -SLP following - cont cortrak feeding for now - if has not improved on SLP re-eval next wee, then would possibly require PEG -Cont to follow. Tolerating ice cream  DVT prophylaxis: Heparin gtt Code Status: DNR Family Communication: Pt in room, family at bedside Disposition Plan: Possible CIR, pending  Consultants:   Neurology  Procedures:     Antimicrobials: Anti-infectives (From admission, onward)   Start     Dose/Rate Route Frequency Ordered Stop   07/19/18 0000  vancomycin (VANCOCIN) IVPB 1000 mg/200 mL premix  Status:  Discontinued     1,000 mg 200 mL/hr over 60 Minutes Intravenous To Surgery 07/18/18 0725 07/18/18 1148      Subjective: Without complaints. Family reports rhythmic shaking of R arm overnight  Objective: Vitals:   07/31/18 0000 07/31/18 0407 07/31/18 0826 07/31/18 1251  BP:   (!) 145/78 (!) 148/74  Pulse:   95 89  Resp:   18 16  Temp:  97.9 F (36.6 C) 97.6 F (36.4 C) (!) 97.5 F (36.4 C)  TempSrc:  Oral Oral Oral  SpO2: 96%  98% 100%  Weight:      Height:        Intake/Output Summary (Last 24 hours) at 07/31/2018 1454 Last data filed at 07/31/2018 1143 Gross per 24 hour  Intake 414 ml  Output -  Net 414 ml   Filed Weights   07/28/18 0353 07/29/18 0600 07/30/18 2100  Weight: 56.6 kg 58.9 kg 58.5 kg    Examination: General exam: Asleep, arousable in no acute distress Respiratory system: normal chest rise, clear, no audible  wheezing Cardiovascular system: regular rhythm, s1-s2 Gastrointestinal system: Nondistended, nontender, pos BS Central nervous system: No seizures, brief clonus noted at bedside Extremities: No cyanosis, no joint deformities Skin: No rashes, no pallor Psychiatry: difficult to assess given mentation  Data Reviewed: I have personally reviewed following labs and imaging studies  CBC: Recent Labs  Lab 07/25/18 0736  07/27/18 0548 07/28/18 0429  07/29/18 0339 07/30/18 0352 07/31/18 0536  WBC 8.4   < > 12.0* 10.9* 8.7 9.7 11.9*  NEUTROABS 6.7  --   --   --   --   --   --   HGB 11.2*   < > 9.6* 8.8* 9.2* 10.0* 10.4*  HCT 36.1   < > 32.4* 30.2* 30.1* 32.6* 33.5*  MCV 89.4   < > 90.8 90.1 89.6 89.8 88.4  PLT 339   < > 395 398 399 437* 465*   < > = values in this interval not displayed.   Basic Metabolic Panel: Recent Labs  Lab 07/24/18 2358 07/25/18 0453 07/26/18 0353 07/27/18 0548 07/29/18 0339  NA 143 141 142 139 141  K 3.2* 3.8 3.5 3.7 4.1  CL 98 97* 101 102 104  CO2 32 27 31 26 28   GLUCOSE 111* 160* 266* 99 152*  BUN 11 15 19 15 16   CREATININE 0.82 0.92 0.89 0.72 0.63  CALCIUM 10.1 9.8 9.0 8.7* 8.8*  MG  --  1.8 2.0  --   --   PHOS  --  5.9* 3.1  --   --    GFR: Estimated Creatinine Clearance: 54.7 mL/min (by C-G formula based on SCr of 0.63 mg/dL). Liver Function Tests: Recent Labs  Lab 07/27/18 0548  AST 19  ALT 16  ALKPHOS 72  BILITOT 0.4  PROT 6.3*  ALBUMIN 2.8*   No results for input(s): LIPASE, AMYLASE in the last 168 hours. Recent Labs  Lab 07/27/18 0912  AMMONIA 34   Coagulation Profile: No results for input(s): INR, PROTIME in the last 168 hours. Cardiac Enzymes: No results for input(s): CKTOTAL, CKMB, CKMBINDEX, TROPONINI in the last 168 hours. BNP (last 3 results) No results for input(s): PROBNP in the last 8760 hours. HbA1C: No results for input(s): HGBA1C in the last 72 hours. CBG: Recent Labs  Lab 07/30/18 1935 07/30/18 2346 07/31/18 0400 07/31/18 1006 07/31/18 1237  GLUCAP 161* 290* 252* 278* 274*   Lipid Profile: No results for input(s): CHOL, HDL, LDLCALC, TRIG, CHOLHDL, LDLDIRECT in the last 72 hours. Thyroid Function Tests: No results for input(s): TSH, T4TOTAL, FREET4, T3FREE, THYROIDAB in the last 72 hours. Anemia Panel: No results for input(s): VITAMINB12, FOLATE, FERRITIN, TIBC, IRON, RETICCTPCT in the last 72 hours. Sepsis Labs: No results for input(s):  PROCALCITON, LATICACIDVEN in the last 168 hours.  No results found for this or any previous visit (from the past 240 hour(s)).   Radiology Studies: No results found.  Scheduled Meds: . chlorhexidine  15 mL Mouth Rinse BID  . feeding supplement (PRO-STAT SUGAR FREE 64)  30 mL Per Tube Daily  . free water  100 mL Per Tube Q8H  . gabapentin  300 mg Oral TID  . gemfibrozil  600 mg Per Tube BID AC  . insulin aspart  0-15 Units Subcutaneous TID WC  . insulin aspart  0-5 Units Subcutaneous QHS  . insulin detemir  10 Units Subcutaneous QHS  . lacosamide  200 mg Per Tube BID  . levETIRAcetam  1,500 mg Per Tube BID  . mouth rinse  15 mL Mouth Rinse q12n4p  . metoprolol tartrate  25 mg Per Tube BID   Continuous Infusions: . sodium chloride 10 mL/hr at 07/28/18 2209  . feeding supplement (JEVITY 1.2 CAL) 1,000 mL (07/30/18 1647)  . heparin 950 Units/hr (07/30/18 1633)     LOS: 18 days   Marylu Lund, MD [facility] Pager On Amion  If 7PM-7AM, please contact night-coverage 07/31/2018, 2:54 PM

## 2018-07-31 NOTE — Progress Notes (Signed)
Pt sleeping quietly in bed, no jerking of arm noticed at this time, will continue to monitor. Obasogie-Asidi, Aleicia Kenagy Efe

## 2018-07-31 NOTE — Progress Notes (Signed)
Pt observed to have vomited after c/o of headache, noticed to be having jerky movement on the right arm, pt more lethargic with slurred speech,pt cleaned up and made comfortable in bed, iv Zofran 4mg  given, NP [place] (on call) paged and notified as pt's daughter at bedside is concern that pt's condition seems to always change during the night time, yet to get a call back, will however continue to monitor. Obasogie-Asidi, Jennife Zaucha Efe

## 2018-08-01 ENCOUNTER — Inpatient Hospital Stay (HOSPITAL_COMMUNITY): Payer: Medicare Other

## 2018-08-01 LAB — GLUCOSE, CAPILLARY
GLUCOSE-CAPILLARY: 182 mg/dL — AB (ref 70–99)
GLUCOSE-CAPILLARY: 206 mg/dL — AB (ref 70–99)
GLUCOSE-CAPILLARY: 246 mg/dL — AB (ref 70–99)
Glucose-Capillary: 230 mg/dL — ABNORMAL HIGH (ref 70–99)
Glucose-Capillary: 159 mg/dL — ABNORMAL HIGH (ref 70–99)
Glucose-Capillary: 260 mg/dL — ABNORMAL HIGH (ref 70–99)

## 2018-08-01 LAB — CBC
HCT: 34.9 % — ABNORMAL LOW (ref 36.0–46.0)
Hemoglobin: 10.8 g/dL — ABNORMAL LOW (ref 12.0–15.0)
MCH: 27.3 pg (ref 26.0–34.0)
MCHC: 30.9 g/dL (ref 30.0–36.0)
MCV: 88.1 fL (ref 80.0–100.0)
PLATELETS: 526 10*3/uL — AB (ref 150–400)
RBC: 3.96 MIL/uL (ref 3.87–5.11)
RDW: 15.1 % (ref 11.5–15.5)
WBC: 15.5 10*3/uL — ABNORMAL HIGH (ref 4.0–10.5)
nRBC: 0 % (ref 0.0–0.2)

## 2018-08-01 LAB — HEPARIN LEVEL (UNFRACTIONATED)
HEPARIN UNFRACTIONATED: 0.82 [IU]/mL — AB (ref 0.30–0.70)
HEPARIN UNFRACTIONATED: 0.31 [IU]/mL (ref 0.30–0.70)
Heparin Unfractionated: 0.75 IU/mL — ABNORMAL HIGH (ref 0.30–0.70)

## 2018-08-01 MED ORDER — INSULIN DETEMIR 100 UNIT/ML ~~LOC~~ SOLN
18.0000 [IU] | Freq: Every day | SUBCUTANEOUS | Status: DC
  Administered 2018-08-01 – 2018-08-06 (×6): 18 [IU] via SUBCUTANEOUS
  Filled 2018-08-01 (×6): qty 0.18

## 2018-08-01 MED ORDER — VALPROIC ACID 250 MG/5ML PO SOLN
500.0000 mg | Freq: Four times a day (QID) | ORAL | Status: DC
  Administered 2018-08-01 – 2018-08-09 (×35): 500 mg
  Filled 2018-08-01 (×29): qty 10
  Filled 2018-08-01: qty 5
  Filled 2018-08-01 (×9): qty 10

## 2018-08-01 MED ORDER — METOPROLOL TARTRATE 25 MG/10 ML ORAL SUSPENSION
25.0000 mg | Freq: Two times a day (BID) | ORAL | Status: DC
  Administered 2018-08-01 – 2018-08-10 (×19): 25 mg
  Filled 2018-08-01 (×21): qty 10

## 2018-08-01 MED ORDER — VALPROATE SODIUM 500 MG/5ML IV SOLN
10.0000 mg/kg | Freq: Once | INTRAVENOUS | Status: AC
  Administered 2018-08-01: 589 mg via INTRAVENOUS
  Filled 2018-08-01: qty 5.89

## 2018-08-01 NOTE — Progress Notes (Signed)
Inpatient Rehabilitation  Following for post acute rehab needs/tolerance as well as medical stability.  Note that patient was unable to work with SLP today and medical work up is on going.  Plan for [name] to return and follow up tomorrow.   Carmelia Roller., CCC/SLP Admission Coordinator  [place]  Cell [phone]

## 2018-08-01 NOTE — Progress Notes (Signed)
PROGRESS NOTE    [name]  TKP:[id number] DOB: 1951-05-11 DOA: 07/13/2018 PCP: [name], MD    Brief Narrative:  67 year old F w/ a history of prior CVAs with carotid artery disease, hypertension, hyperlipidemia, normal pressure hydrocephalus status post shunt who was admitted with seizures and a new left MCA territory stroke. Treated with heparin and antiplatelets. Had recurrent seizures and transferred to ICU 9/29 for Versed and seizure suppression. 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 -Discussed with Neurology who will continue to follow. EEG ordered with valproic acid increased to 500mg  QID with load of valproic acid  Possible myoclonus right arm -appears to be stable at this time  Acute respiratory failure due to AMS and inability to protect  airway -Resolved -presently on minimal O2 support  NPH s/p shunt placement   HTN -BP stable at present -Continue to monitor for now  HLD -Cont lopid as tolerated -Stable at this time  DM -A1c 6.3 - CBG reviewed. Glucose improved but still in the 200's -Will increase levemir to 18 units  Dysphagia  -SLP following - cont cortrak feeding for now - if has not improved on SLP re-eval next wee, then would possibly require PEG -Cont to follow. -SLP following, re-eval when more alert  DVT prophylaxis: Heparin gtt Code Status: DNR Family Communication: Pt in room, family at bedside Disposition Plan: Possible CIR, pending  Consultants:   Neurology  Procedures:     Antimicrobials: Anti-infectives (From admission, onward)   Start     Dose/Rate Route Frequency Ordered Stop   07/19/18 0000  vancomycin (VANCOCIN) IVPB 1000 mg/200 mL premix  Status:  Discontinued     1,000 mg 200 mL/hr over 60 Minutes Intravenous To Surgery 07/18/18 0725 07/18/18 1148      Subjective: Unable to assess given decreased level of alertness  Objective: Vitals:   07/31/18 2331 08/01/18 0400 08/01/18 0900 08/01/18 1241  BP: (!) 161/94 (!) 141/75 (!) 135/96 131/63  Pulse: 91 (!) 101 (!) 111 89  Resp: 18 18 18 18   Temp: 98.3 F (36.8 C) 97.9 F (36.6 C) 98.2 F (36.8 C) 98.5 F (36.9 C)  TempSrc: Axillary Oral Oral Axillary  SpO2: 95% 99% 100% 100%  Weight:      Height:        Intake/Output Summary (Last 24 hours) at 08/01/2018 1545 Last data filed at 07/31/2018 2232 Gross per 24 hour  Intake 364  ml  Output -  Net 364 ml   Filed Weights   07/29/18 0600 07/30/18 2100 07/31/18 2100  Weight: 58.9 kg 58.5 kg 58.9 kg    Examination: General exam: lethargic, in no acute distress Respiratory system: normal chest rise, clear, no audible wheezing Cardiovascular system: regular rhythm, s1-s2 Gastrointestinal system: Nondistended, nontender, pos BS Central nervous system: No active  seizures, no tremors Extremities: No cyanosis, no joint deformities Skin: No rashes, no pallor Psychiatry: Unable to assess given level of alertness  Data Reviewed: I have personally reviewed following labs and imaging studies  CBC: Recent Labs  Lab 07/28/18 0429 07/29/18 0339 07/30/18 0352 07/31/18 0536 08/01/18 0402  WBC 10.9* 8.7 9.7 11.9* 15.5*  HGB 8.8* 9.2* 10.0* 10.4* 10.8*  HCT 30.2* 30.1* 32.6* 33.5* 34.9*  MCV 90.1 89.6 89.8 88.4 88.1  PLT 398 399 437* 465* 263*   Basic Metabolic Panel: Recent Labs  Lab 07/26/18 0353 07/27/18 0548 07/29/18 0339  NA 142 139 141  K 3.5 3.7 4.1  CL 101 102 104  CO2 31 26 28   GLUCOSE 266* 99 152*  BUN 19 15 16   CREATININE 0.89 0.72 0.63  CALCIUM 9.0 8.7* 8.8*  MG 2.0  --   --   PHOS 3.1  --   --    GFR: Estimated Creatinine Clearance: 54.7 mL/min (by C-G formula based on SCr of 0.63 mg/dL). Liver Function Tests: Recent Labs  Lab 07/27/18 0548  AST 19  ALT 16  ALKPHOS 72  BILITOT 0.4  PROT 6.3*  ALBUMIN 2.8*   No results for input(s): LIPASE, AMYLASE in the last 168 hours. Recent Labs  Lab 07/27/18 0912  AMMONIA 34   Coagulation Profile: No results for input(s): INR, PROTIME in the last 168 hours. Cardiac Enzymes: No results for input(s): CKTOTAL, CKMB, CKMBINDEX, TROPONINI in the last 168 hours. BNP (last 3 results) No results for input(s): PROBNP in the last 8760 hours. HbA1C: No results for input(s): HGBA1C in the last 72 hours. CBG: Recent Labs  Lab 07/31/18 1942 07/31/18 2354 08/01/18 0324 08/01/18 0910 08/01/18 1239  GLUCAP 173* 206* 230* 159* 260*   Lipid Profile: No results for input(s): CHOL, HDL, LDLCALC, TRIG, CHOLHDL, LDLDIRECT in the last 72 hours. Thyroid Function Tests: No results for input(s): TSH, T4TOTAL, FREET4, T3FREE, THYROIDAB in the last 72 hours. Anemia Panel: No results for input(s): VITAMINB12, FOLATE, FERRITIN, TIBC, IRON, RETICCTPCT in the last 72 hours. Sepsis Labs: No  results for input(s): PROCALCITON, LATICACIDVEN in the last 168 hours.  No results found for this or any previous visit (from the past 240 hour(s)).   Radiology Studies: No results found.  Scheduled Meds: . chlorhexidine  15 mL Mouth Rinse BID  . feeding supplement (PRO-STAT SUGAR FREE 64)  30 mL Per Tube Daily  . free water  100 mL Per Tube Q8H  . gabapentin  300 mg Oral TID  . gemfibrozil  600 mg Per Tube BID AC  . insulin aspart  0-15 Units Subcutaneous Q4H  . insulin detemir  15 Units Subcutaneous QHS  . lacosamide  200 mg Per Tube BID  . levETIRAcetam  1,500 mg Per Tube BID  . mouth rinse  15 mL Mouth Rinse q12n4p  . metoprolol tartrate  25 mg Per Tube BID  . valproic acid  500 mg Per Tube QID   Continuous Infusions: . sodium chloride 10 mL/hr at 07/28/18 2209  . feeding supplement (JEVITY 1.2 CAL) 1,000 mL (08/01/18 1402)  . heparin  800 Units/hr (08/01/18 0546)     LOS: 39 days   Marylu Lund, MD [facility] Pager On Amion  If 7PM-7AM, please contact night-coverage 08/01/2018, 3:45 PM

## 2018-08-01 NOTE — Progress Notes (Signed)
ANTICOAGULATION CONSULT NOTE - Follow Up Consult  Pharmacy Consult for Heparin Indication: carotid thrombus, CVA  Allergies  Allergen Reactions  . Carbamazepine Hives, Shortness Of Breath and Other (See Comments)  . Penicillins Anaphylaxis, Other (See Comments) and Swelling    Mother, father and brother have history of anaphylaxis reaction to penicillin so pt does not take  My throat swells Familial history of anaphylaxis   . Statins Other (See Comments)    Severe muscle weakness, leg numbness, severe headaches, chest pain   . Tricyclic Antidepressants Other (See Comments)    IMPAIRED MEMORY  . Atorvastatin Other (See Comments)    Severe muscle weakness, leg numbness, severe headaches, chest pain   . Ezetimibe Other (See Comments)    Severe muscle weakness, leg numbness, severe headaches, chest pain   . Nortriptyline Other (See Comments)    IMPAIRED MEMORY  . Quinapril Hcl Other (See Comments)    Unknown reaction  . Rifampin Diarrhea  . Metoclopramide Nausea And Vomiting and Rash    Patient Measurements: Height: 5\' 2"  (157.5 cm) Weight: 129 lb 13.6 oz (58.9 kg) IBW/kg (Calculated) : 50.1 Heparin Dosing Weight:  58.9 kg  Vital Signs: Temp: 98.5 F (36.9 C) (10/14 1241) Temp Source: Axillary (10/14 1241) BP: 131/63 (10/14 1241) Pulse Rate: 89 (10/14 1241)  Labs: Recent Labs    07/30/18 0352 07/31/18 0536 08/01/18 0402 08/01/18 1414  HGB 10.0* 10.4* 10.8*  --   HCT 32.6* 33.5* 34.9*  --   PLT 437* 465* 526*  --   HEPARINUNFRC 0.40 0.48 0.82* 0.75*    Estimated Creatinine Clearance: 54.7 mL/min (by C-G formula based on SCr of 0.63 mg/dL).   Assessment: Anticoag: Heparin for carotid thrombus, CVA. HL 0.82>0.75 today. Hgb 10.8 trending up. Plts 526 trending up.  Goal of Therapy:  Heparin level 0.3-0.5 units/ml Monitor platelets by anticoagulation protocol: Yes   Plan:  Decrease IV heparin to 650 units/hr and recheck in 6 hrs. Goal 0.3-0.5  units/mL   [name] S. [name], PharmD, BCPS Clinical Staff Pharmacist [phone]  [name] 08/01/2018,3:08 PM

## 2018-08-01 NOTE — Progress Notes (Signed)
ANTICOAGULATION CONSULT NOTE - Follow Up Consult  Pharmacy Consult for Heparin  Indication:  CVA and carotid thrombus  Allergies  Allergen Reactions  . Carbamazepine Hives, Shortness Of Breath and Other (See Comments)  . Penicillins Anaphylaxis, Other (See Comments) and Swelling    Mother, father and brother have history of anaphylaxis reaction to penicillin so pt does not take  My throat swells Familial history of anaphylaxis   . Statins Other (See Comments)    Severe muscle weakness, leg numbness, severe headaches, chest pain   . Tricyclic Antidepressants Other (See Comments)    IMPAIRED MEMORY  . Atorvastatin Other (See Comments)    Severe muscle weakness, leg numbness, severe headaches, chest pain   . Ezetimibe Other (See Comments)    Severe muscle weakness, leg numbness, severe headaches, chest pain   . Nortriptyline Other (See Comments)    IMPAIRED MEMORY  . Quinapril Hcl Other (See Comments)    Unknown reaction  . Rifampin Diarrhea  . Metoclopramide Nausea And Vomiting and Rash    Patient Measurements: Height: 5\' 2"  (157.5 cm) Weight: 130 lb 1.1 oz (59 kg) IBW/kg (Calculated) : 50.1 Heparin Dosing Weight:59kg  Vital Signs: Temp: 97.9 F (36.6 C) (10/14 2347) Temp Source: Oral (10/14 2347) BP: 138/82 (10/14 2347) Pulse Rate: 95 (10/14 2347)  Labs: Recent Labs    07/30/18 0352 07/31/18 0536 08/01/18 0402 08/01/18 1414 08/01/18 2245  HGB 10.0* 10.4* 10.8*  --   --   HCT 32.6* 33.5* 34.9*  --   --   PLT 437* 465* 526*  --   --   HEPARINUNFRC 0.40 0.48 0.82* 0.75* 0.31    Estimated Creatinine Clearance: 54.7 mL/min (by C-G formula based on SCr of 0.63 mg/dL).   Medications:  Scheduled:  . chlorhexidine  15 mL Mouth Rinse BID  . feeding supplement (PRO-STAT SUGAR FREE 64)  30 mL Per Tube Daily  . free water  100 mL Per Tube Q8H  . gabapentin  300 mg Oral TID  . gemfibrozil  600 mg Per Tube BID AC  . insulin aspart  0-15 Units Subcutaneous Q4H  .  insulin detemir  18 Units Subcutaneous QHS  . lacosamide  200 mg Per Tube BID  . levETIRAcetam  1,500 mg Per Tube BID  . mouth rinse  15 mL Mouth Rinse q12n4p  . metoprolol tartrate  25 mg Per Tube BID  . valproic acid  500 mg Per Tube QID    Assessment: 68 yof continues on IV heparin for carotid thrombus and CVS. Hgb stable at 10.4, platelets at 465.  No signs/symptoms of bleeding noted.  No line issues noted.  10/14 PM update: heparin level therapeutic tonight at 0.31   Goal of Therapy:  Heparin level 0.3 - 0.5 units/mL Monitor platelets by anticoagulation protocol: Yes   Plan:  Cont heparin at 650 units/hr Confirmatory heparin level with AM labs Follow-up eliquis transition plans  Narda Bonds, PharmD, BCPS Clinical Pharmacist Phone: [phone]

## 2018-08-01 NOTE — Procedures (Signed)
ELECTROENCEPHALOGRAM REPORT   Patient: [name]       Room #: 9Q75F EEG No. ID: [id number] Age: 67 y.o.        Sex: female Referring Physician: Wyline Copas Report Date:  08/01/2018        Interpreting Physician: Alexis Goodell  History: SOKHA CRAKER is an 67 y.o. female with seizures and acute infarct  Medications:  Neurontin, Lopid, Insulin, Levemir, Vimpat, Keppra, Lopressor, Depakene  Conditions of Recording:  This is a 21 channel routine scalp EEG performed with bipolar and monopolar montages arranged in accordance to the international 10/20 system of electrode placement. One channel was dedicated to EKG recording.  The patient is in the awake and drowsy states.  Description:  The background activity is slow and poorly organized.  It consists of a low voltage polymorphic delta activity that is diffusely distributed and continuous.  The patient is stimulated during the recording with activation of the background rhythm.  After stimulation there is noted some theta acitivty intermixed with the aforementioned polymorphic delta activity.     Hyperventilation and intermittent photic stimulation were not performed.   IMPRESSION: This is an abnormal EEG secondary to general background slowing.  This finding may be seen with a diffuse disturbance that is etiologically nonspecific, but may include a metabolic encephalopathy, among other possibilities.      Alexis Goodell, MD Neurology [phone] 08/01/2018, 6:15 PM

## 2018-08-01 NOTE — Progress Notes (Signed)
SLP Cancellation Note  Patient Details Name: [name] MRN: [id number] DOB: 07/24/1951   Cancelled treatment:       Reason Eval/Treat Not Completed: Patient's level of consciousness. Pt too lethargic for Po trials or swallowing therapy. Discussed plan with family, Pt is sleepy due to seizures and new meds. They are hopeful her arousal will improve because they feel her swallowing has improved up until this new seizure. I expect slow progress that may warrant PEG tube, but will plan to return tomorrow am to reevaluate pt readiness for repeat MBS based on arousal.   Herbie Baltimore, MA CCC-SLP  Acute Rehabilitation Services Pager [phone] Office [phone]  Lynann Beaver 08/01/2018, 11:41 AM

## 2018-08-01 NOTE — Progress Notes (Signed)
Pt calm and sleeping in bed at this time, family at bedside. Obasogie-Asidi, Eryx Zane Efe

## 2018-08-01 NOTE — Progress Notes (Signed)
ANTICOAGULATION CONSULT NOTE - Follow Up Consult  Pharmacy Consult for Heparin  Indication:  CVA and carotid thrombus  Allergies  Allergen Reactions  . Carbamazepine Hives, Shortness Of Breath and Other (See Comments)  . Penicillins Anaphylaxis, Other (See Comments) and Swelling    Mother, father and brother have history of anaphylaxis reaction to penicillin so pt does not take  My throat swells Familial history of anaphylaxis   . Statins Other (See Comments)    Severe muscle weakness, leg numbness, severe headaches, chest pain   . Tricyclic Antidepressants Other (See Comments)    IMPAIRED MEMORY  . Atorvastatin Other (See Comments)    Severe muscle weakness, leg numbness, severe headaches, chest pain   . Ezetimibe Other (See Comments)    Severe muscle weakness, leg numbness, severe headaches, chest pain   . Nortriptyline Other (See Comments)    IMPAIRED MEMORY  . Quinapril Hcl Other (See Comments)    Unknown reaction  . Rifampin Diarrhea  . Metoclopramide Nausea And Vomiting and Rash    Patient Measurements: Height: 5\' 2"  (157.5 cm) Weight: 129 lb 13.6 oz (58.9 kg) IBW/kg (Calculated) : 50.1 Heparin Dosing Weight:59kg  Vital Signs: Temp: 97.9 F (36.6 C) (10/14 0400) Temp Source: Oral (10/14 0400) BP: 141/75 (10/14 0400) Pulse Rate: 101 (10/14 0400)  Labs: Recent Labs    07/30/18 0352 07/31/18 0536 08/01/18 0402  HGB 10.0* 10.4* 10.8*  HCT 32.6* 33.5* 34.9*  PLT 437* 465* 526*  HEPARINUNFRC 0.40 0.48 0.82*    Estimated Creatinine Clearance: 54.7 mL/min (by C-G formula based on SCr of 0.63 mg/dL).   Medications:  Scheduled:  . chlorhexidine  15 mL Mouth Rinse BID  . feeding supplement (PRO-STAT SUGAR FREE 64)  30 mL Per Tube Daily  . free water  100 mL Per Tube Q8H  . gabapentin  300 mg Oral TID  . gemfibrozil  600 mg Per Tube BID AC  . insulin aspart  0-15 Units Subcutaneous Q4H  . insulin detemir  15 Units Subcutaneous QHS  . lacosamide  200 mg  Per Tube BID  . levETIRAcetam  1,500 mg Per Tube BID  . mouth rinse  15 mL Mouth Rinse q12n4p  . metoprolol tartrate  25 mg Per Tube BID  . valproic acid  250 mg Per Tube QID    Assessment: 66 yof continues on IV heparin for carotid thrombus and CVS. Hgb stable at 10.4, platelets at 465.  No signs/symptoms of bleeding noted.  No line issues noted.  10/14 update: heparin level elevated this AM, no issues per RN, CBC stable   Goal of Therapy:  Heparin level 0.3 - 0.5 units/mL Monitor platelets by anticoagulation protocol: Yes   Plan:  Dec heparin to 800 units/hr 1400 HL Follow-up eliquis transition plans  Narda Bonds, PharmD, BCPS Clinical Pharmacist Phone: [phone]

## 2018-08-01 NOTE — Progress Notes (Signed)
Patient with recurrent seizure and some degree of decreased responsiveness since then. She has had recurrent seizures this stay with valproic acid added to her anticonvulsant regimen by Dr. Rory Percy yesterday.   Discussed case with Dr. Wyline Copas. Valproic acid scheduled dose being increased to 500 mg QID. Supplemental load of valproic acid 500 mg IV x 1 being administered. Will obtain spot EEG and assess.   Electronically signed: Dr. Kerney Elbe

## 2018-08-01 NOTE — Progress Notes (Signed)
EEG reviewed. Occasional right temporal spikes are noted with negative field at T4. No definite electrographic seizure seen.   Electronically signed: Dr. Kerney Elbe

## 2018-08-01 NOTE — Progress Notes (Signed)
EEG Completed; Results Pending  

## 2018-08-01 NOTE — Progress Notes (Signed)
Physical Therapy Treatment Patient Details Name: [name] MRN: [id number] DOB: [birth year] Today's Date: 08/01/2018    History of Present Illness 67 year old woman, history of prior CVAs with carotid artery disease, hypertension, hyperlipidemia, normal pressure hydrocephalus status post shunt.  Admitted with seizures and a new left MCA territory stroke. . Had recurrent seizures and transferred to ICU 9/29 for Versed and seizure suppression.  Intubated and ventilated 9/29. Extubated 10/6.    PT Comments    Patient seen for activity progression today. OF Note: per nursing and family patient s/p seizure activity last evening. At this time, patient tolerate EOB and use of Stedy for weight bearing activities but remains significantly limited by fatigue and lethargy this session. Current POC remains appropriate if patient is able to arouse and improve participation.   Follow Up Recommendations  CIR     Equipment Recommendations  None recommended by PT    Recommendations for Other Services Rehab consult;OT consult     Precautions / Restrictions Precautions Precautions: Fall Precaution Comments: highly fearful of falling Restrictions Weight Bearing Restrictions: No Other Position/Activity Restrictions: dtr reports pt fell PTA and has a R ankle contusion, bruising noted    Mobility  Bed Mobility Overal bed mobility: Needs Assistance Bed Mobility: Rolling;Sidelying to Sit Rolling: Mod assist Sidelying to sit: Max assist   Sit to supine: Max assist   General bed mobility comments: Patient required increased physical assist for all aspects of bed level mobility today. Max multi modal cues for initiation of movement. Physical assist used to elevate trunk to upright at EOB and chuck pad to rotate hips for positioning. Max assist to return to supine and reposition in bed  Transfers Overall transfer level: Needs assistance Equipment used: Ambulation equipment used(sara  stedy) Transfers: Sit to/from Stand Sit to Stand: Max assist;+2 safety/equipment         General transfer comment: patient required manual hand placement and positioning at EOB for use of sara stedy. Assisted patient with power up using gait belt and chuck pad. Patient unable to maintain RUE support. Tolerated standing in stedy ~8 minutes with neuromuscular facilitation for trunk control and ROM  Ambulation/Gait Ambulation/Gait assistance: (NT patient not safe to perform today)               Stairs             Wheelchair Mobility    Modified Rankin (Stroke Patients Only) Modified Rankin (Stroke Patients Only) Pre-Morbid Rankin Score: Moderately severe disability Modified Rankin: Severe disability     Balance Overall balance assessment: Needs assistance Sitting-balance support: Feet unsupported Sitting balance-Leahy Scale: Poor Sitting balance - Comments: Pt when left with out support looses balance in multiple planes in sitting.   Postural control: Left lateral lean Standing balance support: Bilateral upper extremity supported;During functional activity Standing balance-Leahy Scale: Poor Standing balance comment: Increased physical assist required to maintain weight bearing in stedy                            Cognition Arousal/Alertness: Awake/alert Behavior During Therapy: Anxious Overall Cognitive Status: Impaired/Different from baseline Area of Impairment: Attention;Memory;Following commands;Safety/judgement;Awareness;Problem solving                   Current Attention Level: Selective Memory: Decreased short-term memory Following Commands: Follows one step commands with increased time;Follows one step commands inconsistently Safety/Judgement: Decreased awareness of safety;Decreased awareness of deficits Awareness: Emergent Problem Solving: Slow processing;Difficulty sequencing;Requires verbal  cues;Requires tactile cues;Decreased  initiation General Comments: limited engagement in therapies      Exercises Other Exercises Other Exercises: PROM bil LEs, PROM/ AAROM cervical extension, manual scapular retraction and trunk extension in STEDY     General Comments        Pertinent Vitals/Pain Pain Assessment: Faces Faces Pain Scale: Hurts a little bit Pain Location: Generalized.   Pain Descriptors / Indicators: Grimacing Pain Intervention(s): Monitored during session    Home Living                      Prior Function            PT Goals (current goals can now be found in the care plan section) Acute Rehab PT Goals Patient Stated Goal: didn't state PT Goal Formulation: Patient unable to participate in goal setting Time For Goal Achievement: 08/07/18 Potential to Achieve Goals: Fair Progress towards PT goals: Not progressing toward goals - comment(limited by fatigue and lethargy)    Frequency    Min 4X/week      PT Plan Current plan remains appropriate    Co-evaluation              AM-PAC PT "6 Clicks" Daily Activity  Outcome Measure  Difficulty turning over in bed (including adjusting bedclothes, sheets and blankets)?: Unable Difficulty moving from lying on back to sitting on the side of the bed? : Unable Difficulty sitting down on and standing up from a chair with arms (e.g., wheelchair, bedside commode, etc,.)?: Unable Help needed moving to and from a bed to chair (including a wheelchair)?: Total Help needed walking in hospital room?: Total Help needed climbing 3-5 steps with a railing? : Total 6 Click Score: 6    End of Session Equipment Utilized During Treatment: Gait belt Activity Tolerance: Patient limited by lethargy;Patient limited by fatigue Patient left: in bed;with call bell/phone within reach;with bed alarm set;with family/visitor present(chair position in bed) Nurse Communication: Mobility status PT Visit Diagnosis: Other abnormalities of gait and mobility  (R26.89);History of falling (Z91.81);Other symptoms and signs involving the nervous system (R29.898);Muscle weakness (generalized) (M62.81)     Time: 5038-8828 PT Time Calculation (min) (ACUTE ONLY): 25 min  Charges:  $Therapeutic Activity: 23-37 mins                     Alben Deeds, PT DPT  Board Certified Neurologic Specialist [place] Pager [phone] Office [place] 08/01/2018, 1:54 PM

## 2018-08-02 ENCOUNTER — Inpatient Hospital Stay (HOSPITAL_COMMUNITY): Payer: Medicare Other

## 2018-08-02 DIAGNOSIS — T17908A Unspecified foreign body in respiratory tract, part unspecified causing other injury, initial encounter: Secondary | ICD-10-CM

## 2018-08-02 DIAGNOSIS — T17908D Unspecified foreign body in respiratory tract, part unspecified causing other injury, subsequent encounter: Secondary | ICD-10-CM

## 2018-08-02 LAB — GLUCOSE, CAPILLARY
GLUCOSE-CAPILLARY: 141 mg/dL — AB (ref 70–99)
GLUCOSE-CAPILLARY: 162 mg/dL — AB (ref 70–99)
GLUCOSE-CAPILLARY: 188 mg/dL — AB (ref 70–99)
GLUCOSE-CAPILLARY: 238 mg/dL — AB (ref 70–99)
Glucose-Capillary: 152 mg/dL — ABNORMAL HIGH (ref 70–99)

## 2018-08-02 LAB — VALPROIC ACID LEVEL: Valproic Acid Lvl: 63 ug/mL (ref 50.0–100.0)

## 2018-08-02 LAB — HEPARIN LEVEL (UNFRACTIONATED): HEPARIN UNFRACTIONATED: 0.3 [IU]/mL (ref 0.30–0.70)

## 2018-08-02 NOTE — Progress Notes (Signed)
SLP Cancellation Note  Patient Details Name: [name] MRN: [id number] DOB: 1951-06-29   Cancelled treatment:       Reason Eval/Treat Not Completed: Medical issues which prohibited therapy. Pt with vomiting and concern for aspiration this afternoon. Now lethargic per RN. MBS on hold for today - will f/u for completion as able.   [name] 08/02/2018, 2:31 PM  [name], M.A. [place] Acute Environmental education officer [phone] Office [phone]

## 2018-08-02 NOTE — Progress Notes (Signed)
Notified MD of daughter refusing lab draws, and wanting mom to have picc line.

## 2018-08-02 NOTE — Progress Notes (Signed)
ANTICOAGULATION CONSULT NOTE - Follow Up Consult  Pharmacy Consult for Heparin  Indication:  CVA and carotid thrombus  Allergies  Allergen Reactions  . Carbamazepine Hives, Shortness Of Breath and Other (See Comments)  . Penicillins Anaphylaxis, Other (See Comments) and Swelling    Mother, father and brother have history of anaphylaxis reaction to penicillin so pt does not take  My throat swells Familial history of anaphylaxis   . Statins Other (See Comments)    Severe muscle weakness, leg numbness, severe headaches, chest pain   . Tricyclic Antidepressants Other (See Comments)    IMPAIRED MEMORY  . Atorvastatin Other (See Comments)    Severe muscle weakness, leg numbness, severe headaches, chest pain   . Ezetimibe Other (See Comments)    Severe muscle weakness, leg numbness, severe headaches, chest pain   . Nortriptyline Other (See Comments)    IMPAIRED MEMORY  . Quinapril Hcl Other (See Comments)    Unknown reaction  . Rifampin Diarrhea  . Metoclopramide Nausea And Vomiting and Rash    Patient Measurements: Height: 5\' 2"  (157.5 cm) Weight: 130 lb 1.1 oz (59 kg) IBW/kg (Calculated) : 50.1 Heparin Dosing Weight:59kg  Vital Signs: Temp: 98.3 F (36.8 C) (10/15 0807) Temp Source: Oral (10/15 0343) BP: 130/68 (10/15 0807) Pulse Rate: 99 (10/15 0807)  Labs: Recent Labs    07/31/18 0536 08/01/18 0402 08/01/18 1414 08/01/18 2245 08/02/18 0658  HGB 10.4* 10.8*  --   --   --   HCT 33.5* 34.9*  --   --   --   PLT 465* 526*  --   --   --   HEPARINUNFRC 0.48 0.82* 0.75* 0.31 0.30    Estimated Creatinine Clearance: 54.7 mL/min (by C-G formula based on SCr of 0.63 mg/dL).   Medications:  Scheduled:  . chlorhexidine  15 mL Mouth Rinse BID  . feeding supplement (PRO-STAT SUGAR FREE 64)  30 mL Per Tube Daily  . free water  100 mL Per Tube Q8H  . gabapentin  300 mg Oral TID  . gemfibrozil  600 mg Per Tube BID AC  . insulin aspart  0-15 Units Subcutaneous Q4H  .  insulin detemir  18 Units Subcutaneous QHS  . lacosamide  200 mg Per Tube BID  . levETIRAcetam  1,500 mg Per Tube BID  . mouth rinse  15 mL Mouth Rinse q12n4p  . metoprolol tartrate  25 mg Per Tube BID  . valproic acid  500 mg Per Tube QID    Assessment: 16 yof continues on IV heparin for carotid thrombus and CVS.  CBC stable and no bleeding reported.  Heparin level low end of therapeutic at 0.30 on 650 units/hr.  Goal of Therapy:  Heparin level 0.3 - 0.5 units/mL Monitor platelets by anticoagulation protocol: Yes   Plan:  Increase heparin gtt to 700 units/hr F/u AM heparin level Daily CBC, heparin level, s/s bleeding  Bertis Ruddy, PharmD Clinical Pharmacist Please check AMION for all [place] numbers 08/02/2018 8:44 AM

## 2018-08-02 NOTE — Progress Notes (Signed)
Patient vomited, oral suction completed, zofran given, and notified MD of patient vomiting and lethargy, VS obtained, orders received to hold tube feeding, imaging ordered, and HOB @ 30 degrees or higher.  Placed patient on bedside monitor and increased level of care to stepdown, per MD order.

## 2018-08-02 NOTE — Progress Notes (Signed)
PROGRESS NOTE    [name]  XHB:[id number] DOB: [birth year] DOA: 07/13/2018 PCP: [name], MD    Brief Narrative:  67 year old F w/ a history of prior CVAs with carotid artery disease, hypertension, hyperlipidemia, normal pressure hydrocephalus status post shunt who was admitted with seizures and a new left MCA territory stroke. Treated with heparin and antiplatelets. Had recurrent seizures and transferred to ICU 9/29 for Versed and seizure suppression. 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 -Neurology following. EEG ordered with valproic acid increased to 500mg  QID yesterday. No recurrent seizures per family  Possible myoclonus right arm -appears to be stable at this time  Acute respiratory failure due to AMS and inability to protect airway -encephalopathic today,  see below -currently on minimal O2 support  NPH s/p shunt placement   HTN -BP stable at present -Continue to monitor closely  HLD -Cont lopid as tolerated -Remains stable at this time  DM -A1c 6.3 - CBG reviewed. Glucose improved but still in the 200's -continued on levemir at 18 units  Dysphagia  -SLP following - cont cortrak feeding for now - if has not improved on SLP re-eval next wee, then would possibly require PEG -Cont to follow. -More lethargic today, see below  Aspiration -New event -This AM, patient noted to have vomited while receiving tube feeds -Tube feed now on hold. -Ordered CXR and abd xray, reviewed, no evidence of new PNA or bowel obstruction, although chemical pneumonitis may not be apparent on CXR - Have increased status to stepdown level of status given worsened level of consciousness    DVT prophylaxis: Heparin gtt Code Status: DNR Family Communication: Pt in room, family at bedside Disposition Plan: Possible CIR, pending  Consultants:   Neurology  Procedures:     Antimicrobials: Anti-infectives (From admission, onward)   Start     Dose/Rate Route Frequency Ordered Stop   07/19/18 0000  vancomycin (VANCOCIN) IVPB 1000 mg/200 mL premix  Status:  Discontinued     1,000 mg 200 mL/hr over 60 Minutes Intravenous To Surgery 07/18/18 0725 07/18/18 1148      Subjective: Cannot obtain from patient given lethargy  Objective: Vitals:   08/01/18 2347 08/02/18 0343 08/02/18 0807 08/02/18 1110  BP: 138/82 (!) 125/53 130/68 (!) 175/111  Pulse: 95 98 99 91  Resp:   15 (!) 22  Temp: 97.9 F (36.6 C) 98.7 F (37.1 C) 98.3 F (36.8 C)  98 F (36.7 C)  TempSrc: Oral Oral  Axillary  SpO2: 100% 99% 97% 95%  Weight:      Height:       No intake or output data in the 24 hours ending 08/02/18 1508 Filed Weights   07/30/18 2100 07/31/18 2100 08/01/18 2100  Weight: 58.5 kg 58.9 kg 59 kg    Examination: General exam: Lethargic, emesis at  side of head, in no acute distress Respiratory system: normal chest rise, clear, no audible wheezing Cardiovascular system: regular rhythm, s1-s2 Gastrointestinal system: Nondistended, nontender, pos BS Central nervous system: No seizures, no tremors Extremities: No cyanosis, no joint deformities Skin: No rashes, no pallor Psychiatry: Cannot obtain from patient given level of alertness  Data Reviewed: I have personally reviewed following labs and imaging studies  CBC: Recent Labs  Lab 07/28/18 0429 07/29/18 0339 07/30/18 0352 07/31/18 0536 08/01/18 0402  WBC 10.9* 8.7 9.7 11.9* 15.5*  HGB 8.8* 9.2* 10.0* 10.4* 10.8*  HCT 30.2* 30.1* 32.6* 33.5* 34.9*  MCV 90.1 89.6 89.8 88.4 88.1  PLT 398 399 437* 465* 563*   Basic Metabolic Panel: Recent Labs  Lab 07/27/18 0548 07/29/18 0339  NA 139 141  K 3.7 4.1  CL 102 104  CO2 26 28  GLUCOSE 99 152*  BUN 15 16  CREATININE 0.72 0.63  CALCIUM 8.7* 8.8*   GFR: Estimated Creatinine Clearance: 54.7 mL/min (by C-G formula based on SCr of 0.63 mg/dL). Liver Function Tests: Recent Labs  Lab 07/27/18 0548  AST 19  ALT 16  ALKPHOS 72  BILITOT 0.4  PROT 6.3*  ALBUMIN 2.8*   No results for input(s): LIPASE, AMYLASE in the last 168 hours. Recent Labs  Lab 07/27/18 0912  AMMONIA 34   Coagulation Profile: No results for input(s): INR, PROTIME in the last 168 hours. Cardiac Enzymes: No results for input(s): CKTOTAL, CKMB, CKMBINDEX, TROPONINI in the last 168 hours. BNP (last 3 results) No results for input(s): PROBNP in the last 8760 hours. HbA1C: No results for input(s): HGBA1C in the last 72 hours. CBG: Recent Labs  Lab 08/01/18 1239 08/01/18 1623 08/01/18 2043 08/02/18 0053 08/02/18 0446  GLUCAP 260* 246* 182* 238* 152*   Lipid Profile: No results for input(s): CHOL, HDL, LDLCALC, TRIG, CHOLHDL, LDLDIRECT in the last 72 hours. Thyroid Function Tests: No results for input(s): TSH, T4TOTAL, FREET4, T3FREE,  THYROIDAB in the last 72 hours. Anemia Panel: No results for input(s): VITAMINB12, FOLATE, FERRITIN, TIBC, IRON, RETICCTPCT in the last 72 hours. Sepsis Labs: No results for input(s): PROCALCITON, LATICACIDVEN in the last 168 hours.  No results found for this or any previous visit (from the past 240 hour(s)).   Radiology Studies: Dg Chest Port 1 View  Result Date: 08/02/2018 CLINICAL DATA:  Vomiting.  Evaluate for aspiration. EXAM: PORTABLE CHEST 1 VIEW COMPARISON:  July 25, 2018 FINDINGS: There is opacity in the lateral left lung base which is mildly improved since July 25, 2018. A new feeding tube is identified, terminating below today's film. No other interval changes. IMPRESSION: Improving lateral left basilar infiltrate. Recommend follow-up to complete resolution. Insertion of a feeding tube, incompletely evaluated on this study. Electronically Signed   By: Dorise Bullion III M.D   On: 08/02/2018 12:02   Dg Abd Portable 1v  Result Date: 08/02/2018 CLINICAL DATA:  Vomiting. EXAM: PORTABLE ABDOMEN - 1 VIEW COMPARISON:  07/25/2018. FINDINGS: Surgical tubing noted over the right abdomen. Feeding tube noted with tip over distal stomach or proximal duodenum. No bowel  distention. No acute bony abnormality. IMPRESSION: Feeding tube noted with tip over distal stomach or proximal duodenum. Electronically Signed   By: [place]   On: 08/02/2018 12:04    Scheduled Meds: . chlorhexidine  15 mL Mouth Rinse BID  . feeding supplement (PRO-STAT SUGAR FREE 64)  30 mL Per Tube Daily  . free water  100 mL Per Tube Q8H  . gabapentin  300 mg Oral TID  . gemfibrozil  600 mg Per Tube BID AC  . insulin aspart  0-15 Units Subcutaneous Q4H  . insulin detemir  18 Units Subcutaneous QHS  . lacosamide  200 mg Per Tube BID  . levETIRAcetam  1,500 mg Per Tube BID  . mouth rinse  15 mL Mouth Rinse q12n4p  . metoprolol tartrate  25 mg Per Tube BID  . valproic acid  500 mg Per Tube QID   Continuous  Infusions: . sodium chloride 10 mL/hr at 07/28/18 2209  . feeding supplement (JEVITY 1.2 CAL) 1,000 mL (08/01/18 1402)  . heparin 700 Units/hr (08/02/18 0848)     LOS: 31 days   Marylu Lund, MD [facility] Pager On Amion  If 7PM-7AM, please contact night-coverage 08/02/2018, 3:08 PM

## 2018-08-02 NOTE — Progress Notes (Signed)
Daily Progress Note   Patient Name: [name]       Date: 08/02/2018 DOB: 22-Jun-1951  Age: 67 y.o. MRN#: [id number] Attending Physician: Donne Hazel, MD Primary Care Physician: [name], MD Admit Date: 07/13/2018  Reason for Consultation/Follow-up: Establishing goals of care  Subjective: [name] is not responsive but appears to be sleeping comfortably.   Length of Stay: 20  Current Medications: Scheduled Meds:  . chlorhexidine  15 mL Mouth Rinse BID  . feeding supplement (PRO-STAT SUGAR FREE 64)  30 mL Per Tube Daily  . free water  100 mL Per Tube Q8H  . gabapentin  300 mg Oral TID  . gemfibrozil  600 mg Per Tube BID AC  . insulin aspart  0-15 Units Subcutaneous Q4H  . insulin detemir  18 Units Subcutaneous QHS  . lacosamide  200 mg Per Tube BID  . levETIRAcetam  1,500 mg Per Tube BID  . mouth rinse  15 mL Mouth Rinse q12n4p  . metoprolol tartrate  25 mg Per Tube BID  . valproic acid  500 mg Per Tube QID    Continuous Infusions: . sodium chloride 10 mL/hr at 07/28/18 2209  . feeding supplement (JEVITY 1.2 CAL) 1,000 mL (08/01/18 1402)  . heparin 700 Units/hr (08/02/18 0848)    PRN Meds: [DISCONTINUED] acetaminophen **OR** acetaminophen (TYLENOL) oral liquid 160 mg/5 mL **OR** [DISCONTINUED] acetaminophen, labetalol, LORazepam, ondansetron (ZOFRAN) IV  Physical Exam  Constitutional: She appears well-developed.  HENT:  Head: Normocephalic and atraumatic.  Cardiovascular: Normal rate.  Pulmonary/Chest: Effort normal. No accessory muscle usage. No tachypnea. No respiratory distress.  Abdominal: Normal appearance.  Neurological: She is unresponsive.  Nursing note and vitals reviewed.           Vital Signs: BP (!) 175/111 (BP Location: Left Arm)    Pulse 91   Temp 98 F (36.7 C) (Axillary)   Resp (!) 22   Ht 5' 2" (1.575 m)   Wt 59 kg   SpO2 95%   BMI 23.79 kg/m  SpO2: SpO2: 95 % O2 Device: O2 Device: Room Air O2 Flow Rate: O2 Flow Rate (L/min): 4 L/min  Intake/output summary: No intake or output data in the 24 hours ending 08/02/18 1534 LBM: Last BM Date: 08/01/18 Baseline Weight: Weight: 59 kg Most recent weight: Weight:  59 kg       Palliative Assessment/Data: 10%      Patient Active Problem List   Diagnosis Date Noted  . Advanced care planning/counseling discussion   . Goals of care, counseling/discussion   . Status epilepticus ([place])   . SOB (shortness of breath)   . Adjustment disorder with depressed mood 11/02/2017  . Adjustment disorder with mixed anxiety and depressed mood   . Hemoptysis   . Hematemesis   . Intractable vomiting with nausea   . Acute lower UTI   . Urinary retention   . Diabetic gastroparesis ([place])   . Vascular headache   . Diabetes mellitus ([place])   . CVA (cerebral vascular accident) ([place]) 06/18/2017  . Weakness   . 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 Right hemiparesis ([place])   . Encounter for intubation 06/16/2017  . Palliative care by specialist 06/16/2017  . Anxiety state   . Acute embolic stroke ([place])   . Benign essential HTN   . Diabetic peripheral neuropathy ([place])   . Pseudotumor cerebri   . Hypokalemia   . Seizure ([place])   . Leukocytosis   . Acute blood loss anemia   . Post-operative pain   . Encounter for feeding tube placement   . Respiratory failure ([place])   . Status post tracheostomy ([place])   . Acute respiratory failure with hypoxemia ([place])   . Stenosis of left carotid artery   . Acute CVA (cerebrovascular accident) ([place]) 05/31/2017  . Type II diabetes mellitus with complication ([place]) 54/06/8118  . Hypertension 05/31/2017  . Hyponatremia 05/31/2017  . IIH (idiopathic intracranial hypertension) 01/18/2016  . Malfunction of  ventriculo-peritoneal shunt ([place]) 01/18/2016  . 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 Worsening headaches 01/18/2016  . Increased abdominal girth 01/18/2016  . Abdominal fluid collection 01/18/2016  . Vision changes 01/18/2016  . Perceived hearing changes 01/18/2016  . Nausea without vomiting 01/18/2016  . Peripheral vision loss 01/18/2016  . Imbalance 01/18/2016  . Falls 01/18/2016  . Dysphagia, oropharyngeal 01/18/2016  . Subacute confusional state 01/18/2016  . Left breast mass 05/05/2011    Palliative Care Assessment & Plan   HPI: 67 y.o. 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 During admission she experienced severe seizures requiring voluntary intubation and sedation for control. She has been extubated and is awake. Has ongoing dysphagia and continues to have R sided twitching. Much more alert yesterday and today than previously during admission. Participating some with PT and SLP. High risk for aspiration. Has coretrak placed. Palliative medicine consulted for [place] discussion. Has had some decline with recurrent seizures and now has vomited and has aspirated 08/02/18. [place] conversation continued 08/02/18.   Assessment: I met today at [name] bedside. She is unresponsive and does not awaken throughout my visit. Her daughter, [name], is at bedside and was sleeping before I entered. [name] shares that her mother was clear about DNR and that they respect this. She says her mother was initially adamant that she did NOT want feeding tube but "we talked her into it." [name] continues to be hopeful for feeding tube. I discussed with her my concerns that feeding tubes can cause more harm (the aspiration/vomit today) and unlikely to improve QOL. I asked if her mother was been more alert or mainly sleeping as tube  feedings  going and [name] admits her mother continues to sleep most of the time. However, she also adds that her mother has continued to have seizures and increasing seizure medication. I challenged [name] to continue monitoring her mother and that family should continue to discuss if our interventions are things we are doing FOR her mother or TO her mother. Emotional support provided. [name] is receptive to conversation and to palliative follow up although she is still hopeful for improvement at this time. Will continue to support.   Recommendations/Plan:  Challenged family to continue to consider wishes for aggressive care (specifically PEG tube).   Family requesting PICC line as this has been suggested by lab as they are struggling with successfully obtaining blood samples.   Palliative to continue to follow and support.   Code Status:  DNR  Prognosis:   Overall prognosis very poor with recurrent seizures, poor functional status, and now not tolerating tube feeding.   Discharge Planning:  To Be Determined  Care plan was discussed with Dr. Chiu  Thank you for allowing the Palliative Medicine Team to assist in the care of this patient.   Total Time 25 min Prolonged Time Billed  no       Greater than 50%  of this time was spent counseling and coordinating care related to the above assessment and plan.  Alicia Parker, NP Palliative Medicine Team Pager # [phone] (M-F 8a-5p) Team Phone # [phone] (Nights/Weekends)      

## 2018-08-02 NOTE — Progress Notes (Signed)
Inpatient Rehabilitation-Admissions Coordinator   Spoke with pt's family at the bedside to discuss post acute rehab options. Given pt's current state of lethargy and inability to tolerate therapy sessions, pt is not appropriate for CIR. Family aware that pt is unable to tolerate intensity of CIR program at this time and understands she will need SNF. Per discussion, AC will follow along at a distance and will monitor for therapy tolerance.   Please call if questions.   Jhonnie Garner, OTR/L  Rehab Admissions Coordinator  [phone] 08/02/2018 12:35 PM

## 2018-08-02 NOTE — Progress Notes (Signed)
  Speech Language Pathology Treatment: Dysphagia  Patient Details Name: [name] MRN: [id number] DOB: 09/26/1951 Today's Date: 08/02/2018 Time: 6967-8938 SLP Time Calculation (min) (ACUTE ONLY): 22 min  Assessment / Plan / Recommendation Clinical Impression  Pt had a weak, congested cough at baseline, which was productive of secretions given Min cues for more effortful volitional cough. She consumed bites of puree with intermittent coughing noted, although most recent MBS showed no airway compromise with purees. Pt and her family are interested in repeating MBS to see what progress may have been made. Although previously they had said that they were not interested in PEG, they said that they would now be agreeable to one if it looked like she needed a more prolonged recovery period. Will plan for MBS later this afternoon, as they believe that she is typically most alert at that time.    HPI HPI: Pt is a 67 y.o. female with medical history significant of DM 2, CVA with right sided deficits and dysphagia requiring PEG tube, anemia, GERD, HLD, HTN seizures disorder, chronic refractory headaches, debility, sp PEG and Trach now removed. Admitted with decreased responsiveness. MRI showed acute and subacute infarcts in the left hemisphere in the left MCA distribution raising possibility of embolic source. Note that based on 05/2018 OP SLP therapy documentation, patient tolerating a regular diet, thin liquids. BSE completed 9/26 this admission recomending NPO pending MBS due to inconsistent s/s of aspiration. Pt declined MBS and was started on a trial of full liquids, but then developed seizure activity and was intubated 9/29. Extubated 10/6. MBS 10/8 recommending continue alternative means of nutrition but allowing snacks of purees to increase use of the hyolaryngeam musculature      SLP Plan  MBS       Recommendations  Diet recommendations: Other(comment)(bites of purees from floor stock - not  full meal trays) Medication Administration: Via alternative means                Oral Care Recommendations: Oral care QID Follow up Recommendations: Inpatient Rehab SLP Visit Diagnosis: Dysphagia, pharyngeal phase (R13.13) Plan: MBS       GO                [name] 08/02/2018, 10:08 AM  [name], M.A. [place] Acute Environmental education officer [phone] Office [phone]

## 2018-08-03 ENCOUNTER — Inpatient Hospital Stay (HOSPITAL_COMMUNITY): Payer: Medicare Other

## 2018-08-03 DIAGNOSIS — I69391 Dysphagia following cerebral infarction: Secondary | ICD-10-CM

## 2018-08-03 LAB — GLUCOSE, CAPILLARY
GLUCOSE-CAPILLARY: 117 mg/dL — AB (ref 70–99)
Glucose-Capillary: 101 mg/dL — ABNORMAL HIGH (ref 70–99)
Glucose-Capillary: 90 mg/dL (ref 70–99)
Glucose-Capillary: 153 mg/dL — ABNORMAL HIGH (ref 70–99)

## 2018-08-03 LAB — CBC
HCT: 37 % (ref 36.0–46.0)
Hemoglobin: 11.3 g/dL — ABNORMAL LOW (ref 12.0–15.0)
MCH: 27 pg (ref 26.0–34.0)
MCHC: 30.5 g/dL (ref 30.0–36.0)
MCV: 88.3 fL (ref 80.0–100.0)
Platelets: 528 10*3/uL — ABNORMAL HIGH (ref 150–400)
RBC: 4.19 MIL/uL (ref 3.87–5.11)
RDW: 15.8 % — AB (ref 11.5–15.5)
WBC: 11.1 10*3/uL — AB (ref 4.0–10.5)
nRBC: 0 % (ref 0.0–0.2)

## 2018-08-03 LAB — BASIC METABOLIC PANEL
Anion gap: 15 (ref 5–15)
BUN: 29 mg/dL — AB (ref 8–23)
CALCIUM: 9.9 mg/dL (ref 8.9–10.3)
CO2: 26 mmol/L (ref 22–32)
CREATININE: 1.01 mg/dL — AB (ref 0.44–1.00)
Chloride: 95 mmol/L — ABNORMAL LOW (ref 98–111)
GFR calc Af Amer: >60 mL/min (ref >60)
GFR, EST NON AFRICAN AMERICAN: 57 mL/min — AB (ref >60)
Glucose, Bld: 112 mg/dL — ABNORMAL HIGH (ref 70–99)
Potassium: 5.4 mmol/L — ABNORMAL HIGH (ref 3.5–5.1)
SODIUM: 136 mmol/L (ref 135–145)

## 2018-08-03 LAB — HEPARIN LEVEL (UNFRACTIONATED): HEPARIN UNFRACTIONATED: 0.27 [IU]/mL — AB (ref 0.30–0.70)

## 2018-08-03 LAB — POTASSIUM: Potassium: 4.3 mmol/L (ref 3.5–5.1)

## 2018-08-03 MED ORDER — JEVITY 1.2 CAL PO LIQD: 1000.0000 mL | ORAL | Status: DC

## 2018-08-03 MED ORDER — JEVITY 1.2 CAL PO LIQD
1000.0000 mL | ORAL | Status: DC
  Filled 2018-08-03: qty 1000

## 2018-08-03 MED ORDER — CALCIUM GLUCONATE-NACL 1-0.675 GM/50ML-% IV SOLN
1.0000 g | Freq: Once | INTRAVENOUS | Status: AC
  Administered 2018-08-03: 1000 mg via INTRAVENOUS
  Filled 2018-08-03: qty 50

## 2018-08-03 MED ORDER — JEVITY 1.2 CAL PO LIQD
1000.0000 mL | ORAL | Status: DC
  Administered 2018-08-03 – 2018-08-07 (×4): 1000 mL
  Filled 2018-08-03 (×6): qty 1000

## 2018-08-03 NOTE — Progress Notes (Signed)
Notified registered dietician about stopping tube feeds, orders for clarification on restarting at a slower rate and increasing slowly to be entered per MD order.

## 2018-08-03 NOTE — Progress Notes (Addendum)
Occupational Therapy Treatment Patient Details Name: [name] MRN: [id number] DOB: [birth year] Today's Date: 08/03/2018    History of present illness 67 year old woman, history of prior CVAs with carotid artery disease, hypertension, hyperlipidemia, normal pressure hydrocephalus status post shunt.  Admitted with seizures and a new left MCA territory stroke. . Had recurrent seizures and transferred to ICU 9/29 for Versed and seizure suppression.  Intubated and ventilated 9/29. Extubated 10/6.   OT comments  Patient supine in bed and willing to participate in session.  Patient requires total assist for bed mobility, max to total assist to maintain sitting balance at EOB, increased assist when initating and attempting functional ROM/activities.  Continues to present with increased use of L side compared to R side.  Max assist required for hand over hand washing face. Limited session by fatigue and lethargy. DC plan updated to SNF, goals downgraded. Will continue to follow.     Follow Up Recommendations  SNF;Supervision/Assistance - 24 hour    Equipment Recommendations  None recommended by OT    Recommendations for Other Services      Precautions / Restrictions Precautions Precautions: Fall Restrictions Weight Bearing Restrictions: No       Mobility Bed Mobility Overal bed mobility: Needs Assistance Bed Mobility: Rolling;Sidelying to Sit;Sit to Sidelying Rolling: Total assist Sidelying to sit: Total assist     Sit to sidelying: Total assist;+2 for physical assistance General bed mobility comments: total assist required for bed mobility, pt with limited initation and particiaption in task  Transfers                 General transfer comment: attempted sit to stand from EOB using steady, total assist +2 with only partial squat acheived     Balance Overall balance assessment: Needs assistance Sitting-balance support: Feet unsupported;No upper extremity  supported Sitting balance-Leahy Scale: Poor Sitting balance - Comments: max assist to maintain static sitting balance EOB, increased assistance with any functional activity attempt  Postural control: Left lateral lean                                 ADL either performed or assessed with clinical judgement   ADL Overall ADL's : Needs assistance/impaired     Grooming: Maximal assistance;Sitting Grooming Details (indicate cue type and reason): hand over hand support to wash face with maximal assistance                      Toileting- Clothing Manipulation and Hygiene: Total assistance;Bed level;+2 for physical assistance Toileting - Clothing Manipulation Details (indicate cue type and reason): assist to complete peri care after incontient BM supine/rolling in bed      Functional mobility during ADLs: Maximal assistance;Total assistance(bed mobility ) General ADL Comments: limited participation due to lethargy      Vision   Additional Comments: difficult to assess, cueing to maintain eyes open throughout session    Perception     Praxis      Cognition Arousal/Alertness: Lethargic Behavior During Therapy: Flat affect Overall Cognitive Status: Impaired/Different from baseline Area of Impairment: Attention;Memory;Following commands;Safety/judgement;Awareness;Problem solving                   Current Attention Level: Sustained Memory: Decreased short-term memory Following Commands: Follows one step commands with increased time;Follows one step commands inconsistently Safety/Judgement: Decreased awareness of safety;Decreased awareness of deficits Awareness: Emergent Problem Solving: Slow processing;Difficulty sequencing;Requires verbal cues;Requires  tactile cues;Decreased initiation General Comments: limited engagement in therapies         Exercises     Shoulder Instructions       General Comments daughter present during session, VSS      Pertinent Vitals/ Pain       Pain Assessment: Faces Faces Pain Scale: Hurts little more Pain Location: Generalized.   Pain Descriptors / Indicators: Grimacing Pain Intervention(s): Limited activity within patient's tolerance;Repositioned;Monitored during session  Home Living                                          Prior Functioning/Environment              Frequency  Min 2X/week        Progress Toward Goals  OT Goals(current goals can now be found in the care plan section)  Progress towards OT goals: Goals drowngraded-see care plan  Acute Rehab OT Goals Patient Stated Goal: none stated  Time For Goal Achievement: 08/17/18 Potential to Achieve Goals: [place] Frequency remains appropriate;Discharge plan needs to be updated    Co-evaluation    PT/OT/SLP Co-Evaluation/Treatment: Yes Reason for Co-Treatment: Complexity of the patient's impairments (multi-system involvement);Necessary to address cognition/behavior during functional activity;To address functional/ADL transfers;For patient/therapist safety   OT goals addressed during session: ADL's and self-care;Strengthening/ROM      AM-PAC PT "6 Clicks" Daily Activity     Outcome Measure   Help from another person eating meals?: Total Help from another person taking care of personal grooming?: Total Help from another person toileting, which includes using toliet, bedpan, or urinal?: Total Help from another person bathing (including washing, rinsing, drying)?: Total Help from another person to put on and taking off regular upper body clothing?: Total Help from another person to put on and taking off regular lower body clothing?: Total 6 Click Score: 6    End of Session    OT Visit Diagnosis: Muscle weakness (generalized) (M62.81);Hemiplegia and hemiparesis Hemiplegia - Right/Left: Right Hemiplegia - caused by: Cerebral infarction   Activity Tolerance Patient limited by lethargy    Patient Left in bed;with call bell/phone within reach;with bed alarm set;with nursing/sitter in room;with family/visitor present   Nurse Communication Mobility status        Time: 1505-6979 OT Time Calculation (min): 34 min  Charges: OT General Charges $OT Visit: 1 Visit OT Treatments $Self Care/Home Management : 8-22 mins  Delight Stare, [place] Pager [phone] Office [phone]    Delight Stare 08/03/2018, 4:05 PM

## 2018-08-03 NOTE — Progress Notes (Signed)
Nutrition Follow-up  DOCUMENTATION CODES:   Not applicable  INTERVENTION:  Continue Jevity 1.2 @ 60 ml/hr (1440 ml/day) via Cortrak tube with 30 ml Prostat once daily.  Provide free water flushes of 100 ml TID per tube.   Provides: 1828 kcal, 95 grams protein, and 1466 ml free water.  NUTRITION DIAGNOSIS:   Inadequate oral intake related to inability to eat as evidenced by NPO status; ongoing  GOAL:   Patient will meet greater than or equal to 90% of their needs; met with TF  MONITOR:   TF tolerance, Labs, Skin, Weight trends, I & O's  REASON FOR ASSESSMENT:   Consult Enteral/tube feeding initiation and management  ASSESSMENT:   67 y.o. female with medical history significant of DM 2, CVA, anemia, GERD, HLD, HTN seizures disorder.Pseudotumor cerebri, chronic refractory headaches, debility, sp PEG and Trach now removed. MRI confirmed new CVA Extubated 10/6. 10/7 failed swallow evaluation, Cortrak NGT placed.  Pt underwent MBS today. Per SLP, oropharyngeal swallow unchanged. Pt with fluctuating mentation. Pt continues NPO status except for a few bites of puree/pudding thick liquids from unit stock when fully alert. Daughter at bedside reports likely need for pt PEG placement. Palliative care consult ongoing for pt goals of care. Noted pt with vomiting while on tube feedings yesterday at around noon, thus feeds were put on hold. Recommend restart of tube feeding via Cortrak NGT. RD to continue to monitor. Labs and medications reviewed.   Diet Order:   Diet Order            Diet NPO time specified  Diet effective now              EDUCATION NEEDS:   Not appropriate for education at this time  Skin:  Skin Assessment: Reviewed RN Assessment  Last BM:  10/15  Height:   Ht Readings from Last 1 Encounters:  07/13/18 5' 2"  (1.575 m)    Weight:   Wt Readings from Last 1 Encounters:  08/02/18 58 kg    Ideal Body Weight:  50 kg  BMI:  Body mass index is 23.39  kg/m.  Estimated Nutritional Needs:   Kcal:  1600-1800  Protein:  75-95 grams  Fluid:  > 1.6 L/day    Corrin Parker, MS, RD, LDN Pager # [phone] After hours/ weekend pager # [phone]

## 2018-08-03 NOTE — Progress Notes (Addendum)
Palliative:  I met again today at [name]'s bedside. Daughter continues at bedside. [name] continues to sleep and is unchanged from yesterday on my assessment. Per daughter she has moments of alertness and will converse basically with them at these times. She is restless and agitated at night and sleeps much during the day. I continue to discuss dysphagia and feeding options with daughter. We discussed that aspiration risk will remain with continued severe dysphagia s/p stroke. Even if diet approved and I cannot imagine that [name] would ever be able to eat/drink enough to meet her nutritional and hydration needs. Daughter completely agrees. We discussed feeding tube and risk of continued aspiration that we have already experienced. Daughter says they are still interested in pursuing PEG placement. I did explain that with continued episodes of aspiration that feeding may need to be stopped if this reoccurs. BUT if this occurs it seems to me that family would likely be able to accept that they tried everything. [name] is unable to participate in these discussions at my visits so far.   Exam: Sleeping. Opened eyes just briefly with congested cough but did not engage with me.   Plan: - Pursue PEG placement (see above conversation)  25 min  Vinie Sill, NP Palliative Medicine Team Pager # [phone] (M-F 8a-5p) Team Phone # [phone] (Nights/Weekends)

## 2018-08-03 NOTE — Progress Notes (Signed)
ANTICOAGULATION CONSULT NOTE - Follow Up Consult  Pharmacy Consult for Heparin  Indication:  CVA and carotid thrombus  Allergies  Allergen Reactions  . Carbamazepine Hives, Shortness Of Breath and Other (See Comments)  . Penicillins Anaphylaxis, Other (See Comments) and Swelling    Mother, father and brother have history of anaphylaxis reaction to penicillin so pt does not take  My throat swells Familial history of anaphylaxis   . Statins Other (See Comments)    Severe muscle weakness, leg numbness, severe headaches, chest pain   . Tricyclic Antidepressants Other (See Comments)    IMPAIRED MEMORY  . Atorvastatin Other (See Comments)    Severe muscle weakness, leg numbness, severe headaches, chest pain   . Ezetimibe Other (See Comments)    Severe muscle weakness, leg numbness, severe headaches, chest pain   . Nortriptyline Other (See Comments)    IMPAIRED MEMORY  . Quinapril Hcl Other (See Comments)    Unknown reaction  . Rifampin Diarrhea  . Metoclopramide Nausea And Vomiting and Rash    Patient Measurements: Height: 5\' 2"  (157.5 cm) Weight: 127 lb 13.9 oz (58 kg) IBW/kg (Calculated) : 50.1 Heparin Dosing Weight:59kg  Vital Signs: Temp: 98.6 F (37 C) (10/16 0759) Temp Source: Axillary (10/16 0759) BP: 136/74 (10/16 0759) Pulse Rate: 100 (10/16 0343)  Labs: Recent Labs    08/01/18 0402  08/01/18 2245 08/02/18 0658 08/03/18 0543  HGB 10.8*  --   --   --  11.3*  HCT 34.9*  --   --   --  37.0  PLT 526*  --   --   --  528*  HEPARINUNFRC 0.82*   < > 0.31 0.30 0.27*  CREATININE  --   --   --   --  1.01*   < > = values in this interval not displayed.    Estimated Creatinine Clearance: 43.3 mL/min (A) (by C-G formula based on SCr of 1.01 mg/dL (H)).   Medications:  Scheduled:  . chlorhexidine  15 mL Mouth Rinse BID  . feeding supplement (PRO-STAT SUGAR FREE 64)  30 mL Per Tube Daily  . free water  100 mL Per Tube Q8H  . gabapentin  300 mg Oral TID  .  gemfibrozil  600 mg Per Tube BID AC  . insulin aspart  0-15 Units Subcutaneous Q4H  . insulin detemir  18 Units Subcutaneous QHS  . lacosamide  200 mg Per Tube BID  . levETIRAcetam  1,500 mg Per Tube BID  . mouth rinse  15 mL Mouth Rinse q12n4p  . metoprolol tartrate  25 mg Per Tube BID  . valproic acid  500 mg Per Tube QID    Assessment: 37 yof continues on IV heparin for carotid thrombus and CVS.   Heparin level slightly subtherapeutic at 0.27 on 700 units/hr, CBC improved and no bleeding reported at this time.    Goal of Therapy:  Heparin level 0.3 - 0.5 units/mL Monitor platelets by anticoagulation protocol: Yes   Plan:  Increase heparin gtt to 800 units/hr F/u 6 hour heparin level to confirm Daily heparin level, CBC, s/s bleeding  Bertis Ruddy, PharmD Clinical Pharmacist Please check AMION for all [place] numbers 08/03/2018 8:41 AM

## 2018-08-03 NOTE — Progress Notes (Signed)
PROGRESS NOTE  [name] PPJ:[id number] DOB: 14-May-1951 DOA: 07/13/2018 PCP: Morgan Manes, MD  HPI/Recap of past 78 hours: 67 year old F w/ a history of prior CVAs with carotid artery disease, hypertension, hyperlipidemia, normal pressure hydrocephalus status post shunt who was admitted with seizures and a new left MCA territory stroke. Treated with heparin and antiplatelets. Had recurrent seizures and transferred to ICU 9/29 for Versed and seizure suppression. Intubated and ventilated 9/29.   08/03/2018: Patient seen and examined at her bedside.  She is alert and oriented x2.  Cord track in place and head of bed at an elevated angle.  Denies nausea.  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 Neurology consulted and following Anticoagulation as recommended by neurology On heparin drip due to possible procedures  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 [place] neurology Continue anti-seizures medications  Severe dysphagia status post cortrack placement/feeding Speech therapist following Will possibly require PEG tube  Suspected chemical pneumonitis from possible aspiration No sign of systemic involvement Monitor fever curve and WBC  Hyperkalemia Potassium 5.4 Repeat K If elevated treat with calcium gluconate, Kayexalate, insulin IV and D50  Chronic normocytic anemia Hemoglobin appears to be at baseline of 11 MCV 88 No sign of overt bleeding Repeat CBC in the morning     Code  Status: DNR  Family Communication: None at bedside  Disposition Plan: Home versus CIR possibly 1 to 2 days   Consultants:  Neurology  Procedures:  None  Antimicrobials:  None  DVT prophylaxis: Heparin drip   Objective: Vitals:   08/03/18 0343 08/03/18 0410 08/03/18 0759 08/03/18 1200  BP:  130/61 136/74 (!) 152/89  Pulse: 100   90  Resp: 18     Temp: 97.8 F (36.6 C)  98.6 F (37 C)   TempSrc: Axillary  Axillary   SpO2:  96% 95%   Weight:      Height:        Intake/Output Summary (Last 24 hours) at 08/03/2018 1533 Last data filed at 08/03/2018 1158 Gross per 24 hour  Intake 220 ml  Output -  Net 220 ml   Filed Weights   07/31/18 2100 08/01/18 2100 08/02/18 2100  Weight: 58.9 kg 59 kg 58 kg    Exam:  . General: 67 y.o. year-old female well developed well nourished in no acute distress.  Alert and oriented x2.  Cord track in place. . Cardiovascular: Regular rate and rhythm with no rubs or gallops.  No thyromegaly or JVD noted.   Marland Kitchen Respiratory: Clear to auscultation with no wheezes or rales. Good inspiratory effort. . Abdomen: Soft nontender nondistended with normal bowel sounds x4 quadrants. . Musculoskeletal: No lower extremity edema. 2/4 pulses in all 4 extremities. Marland Kitchen Psychiatry: Mood is appropriate for condition and setting   Data Reviewed: CBC: Recent Labs  Lab 07/29/18 0339 07/30/18 0352 07/31/18 0536 08/01/18 0402 08/03/18 0543  WBC 8.7 9.7 11.9* 15.5* 11.1*  HGB 9.2* 10.0* 10.4* 10.8* 11.3*  HCT 30.1* 32.6* 33.5* 34.9* 37.0  MCV 89.6 89.8 88.4  88.1 88.3  PLT 399 437* 465* 526* 128*   Basic Metabolic Panel: Recent Labs  Lab 07/29/18 0339 08/03/18 0543  NA 141 136  K 4.1 5.4*  CL 104 95*  CO2 28 26  GLUCOSE 152* 112*  BUN 16 29*  CREATININE 0.63 1.01*  CALCIUM 8.8* 9.9   GFR: Estimated Creatinine Clearance: 43.3 mL/min (A) (by C-G formula based on SCr of 1.01 mg/dL (H)). Liver Function Tests: No results for input(s): AST,  ALT, ALKPHOS, BILITOT, PROT, ALBUMIN in the last 168 hours. No results for input(s): LIPASE, AMYLASE in the last 168 hours. No results for input(s): AMMONIA in the last 168 hours. Coagulation Profile: No results for input(s): INR, PROTIME in the last 168 hours. Cardiac Enzymes: No results for input(s): CKTOTAL, CKMB, CKMBINDEX, TROPONINI in the last 168 hours. BNP (last 3 results) No results for input(s): PROBNP in the last 8760 hours. HbA1C: No results for input(s): HGBA1C in the last 72 hours. CBG: Recent Labs  Lab 08/02/18 1729 08/02/18 1947 08/03/18 0015 08/03/18 0349 08/03/18 0800  GLUCAP 141* 162* 117* 101* 90   Lipid Profile: No results for input(s): CHOL, HDL, LDLCALC, TRIG, CHOLHDL, LDLDIRECT in the last 72 hours. Thyroid Function Tests: No results for input(s): TSH, T4TOTAL, FREET4, T3FREE, THYROIDAB in the last 72 hours. Anemia Panel: No results for input(s): VITAMINB12, FOLATE, FERRITIN, TIBC, IRON, RETICCTPCT in the last 72 hours. Urine analysis:    Component Value Date/Time   COLORURINE YELLOW 06/20/2017 0642   APPEARANCEUR HAZY (A) 06/20/2017 0642   LABSPEC 1.010 06/20/2017 0642   PHURINE 6.0 06/20/2017 0642   GLUCOSEU 50 (A) 06/20/2017 0642   HGBUR LARGE (A) 06/20/2017 0642   BILIRUBINUR NEGATIVE 06/20/2017 0642   KETONESUR NEGATIVE 06/20/2017 0642   PROTEINUR NEGATIVE 06/20/2017 0642   NITRITE POSITIVE (A) 06/20/2017 0642   LEUKOCYTESUR LARGE (A) 06/20/2017 0642   Sepsis Labs: @LABRCNTIP (procalcitonin:4,lacticidven:4)  )No results found for this or any previous visit (from the past 240 hour(s)).    Studies: Dg Swallowing Func-speech Pathology  Result Date: 08/03/2018 Objective Swallowing Evaluation: Type of Study: MBS-Modified Barium Swallow Study  Patient Details Name: [name] MRN: [id number] Date of Birth: 22-Sep-1951 Today's Date: 08/03/2018 Time: SLP Start Time (ACUTE ONLY): 1323 -SLP Stop Time (ACUTE ONLY): 1345 SLP Time Calculation  (min) (ACUTE ONLY): 22 min Past Medical History: Past Medical History: Diagnosis Date . Anemia   takes iron supplement . Anesthesia complication   disorientation due to pseudotumor . Diabetes mellitus   IDDM . Family history of anesthesia complication 49 yrs ago  brother stopped breathing for a minute or two . GERD (gastroesophageal reflux disease)  . Headache(784.0)   due to pseudotumor; daily headache . Hypercholesteremia   unable to tolerate statins . Hypertension   under control with meds., has been on med. x 5 yr. . Peripheral neuropathy  . PONV (postoperative nausea and vomiting)  . 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 Seizures ([place])   due to shunt failure; last seizure 2007 . Synovitis of ankle 04/2013  left . Wears dentures   full Past Surgical History: Past Surgical History: Procedure Laterality Date . ANKLE ARTHROSCOPY Left 05/18/2013  Procedure: LEFT ANKLE ARTHROSCOPY WITH DEBRIDEMENT,  SUBTALAR OPEN DEBRIDEMENT ;  Surgeon: Wylene Simmer, MD;  Location: [facility];  Service: Orthopedics;  Laterality: Left; . APPENDECTOMY    as a child . BALLOON DILATION N/A 07/04/2013  Procedure: BALLOON DILATION;  Surgeon: Garlan Fair, MD;  Location: [facility];  Service: Endoscopy;  Laterality: N/A; . BLADDER SUSPENSION   . BREAST LUMPECTOMY W/ NEEDLE LOCALIZATION Left 05/22/2011 . CAROTID ANGIOGRAPHY Bilateral 06/03/2017  Procedure: Bilateral Carotid Angiography;  Surgeon: Conrad , MD;  Location: [facility];  Service: Cardiovascular;  Laterality: Bilateral; . COLONOSCOPY WITH PROPOFOL N/A 05/14/2014  Procedure: COLONOSCOPY WITH PROPOFOL;  Surgeon: Garlan Fair, MD;  Location: [facility];  Service: Endoscopy;  Laterality: N/A; . ESOPHAGEAL DILATION  06/23/2006; 08/05/2004 . ESOPHAGOGASTRODUODENOSCOPY N/A 07/04/2013  Procedure: ESOPHAGOGASTRODUODENOSCOPY (EGD);  Surgeon: Garlan Fair, MD;  Location: [facility];  Service: Endoscopy;  Laterality: N/A; .  ESOPHAGOGASTRODUODENOSCOPY (EGD) WITH PROPOFOL N/A 05/14/2014  Procedure: ESOPHAGOGASTRODUODENOSCOPY (EGD) WITH PROPOFOL;  Surgeon: Garlan Fair, MD;  Location: [facility];  Service: Endoscopy;  Laterality: N/A; . IR ANGIO VERTEBRAL SEL SUBCLAVIAN INNOMINATE UNI R MOD SED  06/03/2017 . IR GASTROSTOMY TUBE MOD SED  06/25/2017 . IR GASTROSTOMY TUBE REMOVAL  08/20/2017 . IR PERCUTANEOUS ART THROMBECTOMY/INFUSION INTRACRANIAL INC DIAG ANGIO  06/03/2017 . IR PERCUTANEOUS ART THROMBECTOMY/INFUSION INTRACRANIAL INC DIAG ANGIO  06/03/2017 . IR RADIOLOGIST EVAL & MGMT  09/07/2017 . LUMBAR PERITONEAL SHUNT    x 2 . OVARIAN CYST REMOVAL    age 37 . RADIOLOGY WITH ANESTHESIA N/A 06/03/2017  Procedure: RADIOLOGY WITH ANESTHESIA;  Surgeon: Luanne Bras, MD;  Location: [facility];  Service: Radiology;  Laterality: N/A; . SHUNT REVISION  2007 . TOTAL ABDOMINAL HYSTERECTOMY W/ BILATERAL SALPINGOOPHORECTOMY  1994  age 75s . WRIST SURGERY Left 2014   [name] HPI: Pt is a 67 y.o. female with medical history significant of DM 2, CVA with right sided deficits and dysphagia requiring PEG tube, anemia, GERD, HLD, HTN seizures disorder, chronic refractory headaches, debility, sp PEG and Trach now removed. Admitted with decreased responsiveness. MRI showed acute and subacute infarcts in the left hemisphere in the left MCA distribution raising possibility of embolic source. Note that based on 05/2018 OP SLP therapy documentation, patient tolerating a regular diet, thin liquids. BSE completed 9/26 this admission recomending NPO pending MBS due to inconsistent s/s of aspiration. Pt declined MBS and was started on a trial of full liquids, but then developed seizure activity and was intubated 9/29. Extubated 10/6. MBS 10/8 recommending continue alternative means of nutrition but allowing snacks of purees to increase use of the hyolaryngeam musculature  Subjective: drowsy Assessment / Plan / Recommendation CHL IP CLINICAL IMPRESSIONS 08/03/2018  Clinical Impression Pt's oropharyngeal swallow remains grossly unchanged. She continues to have audible secretions, needing Max cues to expectorate with assistance from SLP using [place]. All liquids tested are aspirated with difficulty clearing from the airway. Although she maintained better airway protection with purees, I do not think she has the endurance or the arousal to consume a full meal tray in a safe manner. I suspect that pt will need a more prolonged period of time before she will be able to sustain herself with a PO diet. Discussed this with her husband, who was present for the study. He admitted that he was disappointed, but acknowledged information presented. Would continue to keep her NPO except for a few bites of puree or pudding thick liquids (nothing thinner than these consistencies) when fully alert (pending additional conversations with palliative care). SLP Visit Diagnosis Dysphagia, oropharyngeal phase (R13.12) Attention and concentration deficit following -- Frontal lobe and executive function deficit following -- Impact on safety and function Moderate aspiration risk;Risk for inadequate nutrition/hydration   CHL IP TREATMENT RECOMMENDATION 08/03/2018 Treatment Recommendations Therapy as outlined  in treatment plan below   Prognosis 08/03/2018 Prognosis for Safe Diet Advancement Good Barriers to Reach Goals -- Barriers/Prognosis Comment -- CHL IP DIET RECOMMENDATION 08/03/2018 SLP Diet Recommendations Alternative means - long-term;Other (Comment) Liquid Administration via Spoon Medication Administration Via alternative means Compensations -- Postural Changes --   CHL IP OTHER RECOMMENDATIONS 08/03/2018 Recommended Consults -- Oral Care Recommendations Oral care QID Other Recommendations Have oral suction available   CHL IP FOLLOW UP RECOMMENDATIONS 08/03/2018 Follow up Recommendations Skilled Nursing facility   [facility] IP FREQUENCY AND DURATION 08/03/2018 Speech Therapy Frequency (ACUTE ONLY)  min 2x/week Treatment Duration 2 weeks      CHL IP ORAL PHASE 08/03/2018 Oral Phase Impaired Oral - Pudding Teaspoon -- Oral - Pudding Cup -- Oral - Honey Teaspoon Weak lingual manipulation;Lingual/palatal residue Oral - Honey Cup -- Oral - Nectar Teaspoon Weak lingual manipulation;Lingual/palatal residue Oral - Nectar Cup -- Oral - Nectar Straw -- Oral - Thin Teaspoon -- Oral - Thin Cup -- Oral - Thin Straw -- Oral - Puree Weak lingual manipulation;Lingual/palatal residue;Delayed oral transit Oral - Mech Soft -- Oral - Regular -- Oral - Multi-Consistency -- Oral - Pill -- Oral Phase - Comment --  CHL IP PHARYNGEAL PHASE 08/03/2018 Pharyngeal Phase Impaired Pharyngeal- Pudding Teaspoon -- Pharyngeal -- Pharyngeal- Pudding Cup -- Pharyngeal -- Pharyngeal- Honey Teaspoon Reduced epiglottic inversion;Reduced anterior laryngeal mobility;Reduced laryngeal elevation;Reduced airway/laryngeal closure;Penetration/Aspiration during swallow Pharyngeal Material enters airway, passes BELOW cords without attempt by patient to eject out (silent aspiration) Pharyngeal- Honey Cup -- Pharyngeal -- Pharyngeal- Nectar Teaspoon Reduced epiglottic inversion;Reduced anterior laryngeal mobility;Reduced laryngeal elevation;Reduced airway/laryngeal closure;Penetration/Aspiration during swallow Pharyngeal Material enters airway, passes BELOW cords without attempt by patient to eject out (silent aspiration) Pharyngeal- Nectar Cup -- Pharyngeal -- Pharyngeal- Nectar Straw -- Pharyngeal -- Pharyngeal- Thin Teaspoon -- Pharyngeal -- Pharyngeal- Thin Cup -- Pharyngeal -- Pharyngeal- Thin Straw -- Pharyngeal -- Pharyngeal- Puree Reduced epiglottic inversion;Reduced anterior laryngeal mobility;Reduced laryngeal elevation;Reduced airway/laryngeal closure;Penetration/Aspiration during swallow Pharyngeal Material enters airway, remains ABOVE vocal cords then ejected out Pharyngeal- Mechanical Soft -- Pharyngeal -- Pharyngeal- Regular -- Pharyngeal --  Pharyngeal- Multi-consistency -- Pharyngeal -- Pharyngeal- Pill -- Pharyngeal -- Pharyngeal Comment --  CHL IP CERVICAL ESOPHAGEAL PHASE 08/03/2018 Cervical Esophageal Phase WFL Pudding Teaspoon -- Pudding Cup -- Honey Teaspoon -- Honey Cup -- Nectar Teaspoon -- Nectar Cup -- Nectar Straw -- Thin Teaspoon -- Thin Cup -- Thin Straw -- Puree -- Mechanical Soft -- Regular -- Multi-consistency -- Pill -- Cervical Esophageal Comment -- [name] 08/03/2018, 2:14 PM [name], M.A. CCC-SLP Acute Rehabilitation Services Pager [phone] Office [phone]               Scheduled Meds: . chlorhexidine  15 mL Mouth Rinse BID  . feeding supplement (PRO-STAT SUGAR FREE 64)  30 mL Per Tube Daily  . free water  100 mL Per Tube Q8H  . gabapentin  300 mg Oral TID  . gemfibrozil  600 mg Per Tube BID AC  . insulin aspart  0-15 Units Subcutaneous Q4H  . insulin detemir  18 Units Subcutaneous QHS  . lacosamide  200 mg Per Tube BID  . levETIRAcetam  1,500 mg Per Tube BID  . mouth rinse  15 mL Mouth Rinse q12n4p  . metoprolol tartrate  25 mg Per Tube BID  . valproic acid  500 mg Per Tube QID    Continuous Infusions: . sodium chloride 10 mL/hr at 07/28/18 2209  . feeding supplement (JEVITY 1.2 CAL) Stopped (08/02/18  1149)  . heparin 800 Units/hr (08/03/18 1158)     LOS: 21 days     Kayleen Memos, MD [facility] Pager [phone]  If 7PM-7AM, please contact night-coverage [email or web address] Password [facility] 08/03/2018, 3:33 PM

## 2018-08-03 NOTE — Progress Notes (Signed)
Modified Barium Swallow Progress Note  Patient Details  Name: [name] MRN: [id number] Date of Birth: [birth year]  Today's Date: 08/03/2018  Modified Barium Swallow completed.  Full report located under Chart Review in the Imaging Section.  Brief recommendations include the following:  Clinical Impression  Pt's oropharyngeal swallow remains grossly unchanged. She continues to have audible secretions, needing Max cues to expectorate with assistance from SLP using [place]. All liquids tested are aspirated with difficulty clearing from the airway. Although she maintained better airway protection with purees, I do not think she has the endurance or the arousal to consume a full meal tray in a safe manner. I suspect that pt will need a more prolonged period of time before she will be able to sustain herself with a PO diet. Discussed this with her husband, who was present for the study. He admitted that he was disappointed, but acknowledged information presented. Would continue to keep her NPO except for a few bites of puree or pudding thick liquids (nothing thinner than these consistencies) when fully alert (pending additional conversations with palliative care).   Swallow Evaluation Recommendations       SLP Diet Recommendations: Alternative means - long-term;Other (Comment)(pt can have bites of puree/pudding thick liquids from floor )   Liquid Administration via: Spoon   Medication Administration: Via alternative means               Oral Care Recommendations: Oral care QID   Other Recommendations: Have oral suction available    [name] 08/03/2018,2:13 PM   [name], M.A. [place] Acute Environmental education officer [phone] Office [phone]

## 2018-08-03 NOTE — Progress Notes (Signed)
  Speech Language Pathology Treatment: Dysphagia  Patient Details Name: [name] MRN: [id number] DOB: [birth year] Today's Date: 08/03/2018 Time: [id number] SLP Time Calculation (min) (ACUTE ONLY): 10 min  Assessment / Plan / Recommendation Clinical Impression  Pt was seen for dysphagia f/u and to assess readiness to repeat MBS. She is drowsy but alert enough to take in POs with Max encouragement. SLP used yankauer to clear oral residue from purees but she did trigger a pharyngeal response. She has a congested baseline cough and needs Mod-Max cues to cough and expectorate secretions that are thick but small in volume. Unfortunately, given her fluctuating mentation, it may be difficult to schedule a time that works for her to complete a swallow study, but will attempt again later today. I do worry that even if there is small improvement in her swallow function, that as she waxes/wanes she will have fluctuating aspiration risk as well as likely limited PO intake to maintain hydration and nutrition. No family was present for education today.    HPI HPI: Pt is a 67 y.o. female with medical history significant of DM 2, CVA with right sided deficits and dysphagia requiring PEG tube, anemia, GERD, HLD, HTN seizures disorder, chronic refractory headaches, debility, sp PEG and Trach now removed. Admitted with decreased responsiveness. MRI showed acute and subacute infarcts in the left hemisphere in the left MCA distribution raising possibility of embolic source. Note that based on 05/2018 OP SLP therapy documentation, patient tolerating a regular diet, thin liquids. BSE completed 9/26 this admission recomending NPO pending MBS due to inconsistent s/s of aspiration. Pt declined MBS and was started on a trial of full liquids, but then developed seizure activity and was intubated 9/29. Extubated 10/6. MBS 10/8 recommending continue alternative means of nutrition but allowing snacks of purees to increase use of  the hyolaryngeam musculature      SLP Plan  MBS       Recommendations  Diet recommendations: NPO;Other(comment)(bites of puree only if fully alert and accepting) Medication Administration: Via alternative means                Oral Care Recommendations: Oral care QID Follow up Recommendations: Inpatient Rehab SLP Visit Diagnosis: Dysphagia, pharyngeal phase (R13.13) Plan: MBS       GO                [name] 08/03/2018, 11:20 AM

## 2018-08-03 NOTE — Progress Notes (Signed)
Physical Therapy Treatment Patient Details Name: [name] MRN: [id number] DOB: 1951/02/02 Today's Date: 08/03/2018    History of Present Illness 67 year old woman, history of prior CVAs with carotid artery disease, hypertension, hyperlipidemia, normal pressure hydrocephalus status post shunt.  Admitted with seizures and a new left MCA territory stroke. . Had recurrent seizures and transferred to ICU 9/29 for Versed and seizure suppression.  Intubated and ventilated 9/29. Extubated 10/6.    PT Comments    Limited treatment today due to pf's lethargy and fatigue.   Plan was to stand in the [place] working on upright stance, work on sitting balance and get to the chair.   Follow Up Recommendations  SNF;Supervision/Assistance - 24 hour     Equipment Recommendations  None recommended by PT    Recommendations for Other Services       Precautions / Restrictions Precautions Precautions: Fall Restrictions Weight Bearing Restrictions: No    Mobility  Bed Mobility Overal bed mobility: Needs Assistance Bed Mobility: Rolling;Sidelying to Sit;Sit to Sidelying Rolling: Mod assist Sidelying to sit: Max assist;+2 for physical assistance     Sit to sidelying: Mod assist;+2 for physical assistance General bed mobility comments: cues for sequencing, Truncal assist up via L UE.  Transfers Overall transfer level: Needs assistance Equipment used: Ambulation equipment used Transfers: Sit to/from Stand Sit to Stand: Total assist;+2 physical assistance         General transfer comment: attempted sit to stand from EOB using steady, total assist +2 with only partial squat acheived   Ambulation/Gait                 Stairs             Wheelchair Mobility    Modified Rankin (Stroke Patients Only) Modified Rankin (Stroke Patients Only) Pre-Morbid Rankin Score: Moderately severe disability Modified Rankin: Severe disability     Balance Overall balance assessment:  Needs assistance Sitting-balance support: Feet unsupported Sitting balance-Leahy Scale: Poor Sitting balance - Comments: loses balance posteriorly and to the left.  Pt unable to maintain upright sitting without assist Postural control: Left lateral lean                                  Cognition Arousal/Alertness: Awake/alert Behavior During Therapy: Flat affect;Anxious Overall Cognitive Status: Impaired/Different from baseline(not formally tested.) Area of Impairment: Attention;Memory;Following commands;Safety/judgement;Awareness;Problem solving                   Current Attention Level: Sustained Memory: Decreased short-term memory Following Commands: Follows one step commands with increased time;Follows one step commands inconsistently Safety/Judgement: Decreased awareness of safety;Decreased awareness of deficits Awareness: Emergent Problem Solving: Slow processing;Difficulty sequencing;Requires verbal cues;Requires tactile cues;Decreased initiation General Comments: (limited interaction today)      Exercises Other Exercises Other Exercises: PROM, AAROM to bil LE's and UE's    General Comments General comments (skin integrity, edema, etc.): Daughter present during the session.  Pt with congested cough.  Pt left sitting upright in bed.      Pertinent Vitals/Pain Pain Assessment: Faces Faces Pain Scale: Hurts little more Pain Location: Generalized.   Pain Descriptors / Indicators: Grimacing Pain Intervention(s): Limited activity within patient's tolerance;Repositioned;Monitored during session    Home Living                      Prior Function  PT Goals (current goals can now be found in the care plan section) Acute Rehab PT Goals Patient Stated Goal: none stated  PT Goal Formulation: Patient unable to participate in goal setting Time For Goal Achievement: 08/07/18 Potential to Achieve Goals: Fair Progress towards PT goals:  Not progressing toward goals - comment(too lethargic and weak)    Frequency    Min 3X/week      PT Plan Current plan remains appropriate    Co-evaluation PT/OT/SLP Co-Evaluation/Treatment: Yes Reason for Co-Treatment: Complexity of the patient's impairments (multi-system involvement) PT goals addressed during session: Mobility/safety with mobility OT goals addressed during session: Strengthening/ROM;ADL's and self-care      AM-PAC PT "6 Clicks" Daily Activity  Outcome Measure  Difficulty turning over in bed (including adjusting bedclothes, sheets and blankets)?: Unable Difficulty moving from lying on back to sitting on the side of the bed? : Unable Difficulty sitting down on and standing up from a chair with arms (e.g., wheelchair, bedside commode, etc,.)?: Unable Help needed moving to and from a bed to chair (including a wheelchair)?: Total Help needed walking in hospital room?: Total Help needed climbing 3-5 steps with a railing? : Total 6 Click Score: 6    End of Session   Activity Tolerance: Patient limited by lethargy;Patient limited by fatigue Patient left: in bed;with call bell/phone within reach;with bed alarm set;with family/visitor present Nurse Communication: Mobility status PT Visit Diagnosis: Other abnormalities of gait and mobility (R26.89);History of falling (Z91.81);Other symptoms and signs involving the nervous system (R29.898);Muscle weakness (generalized) (M62.81)     Time: 6301-6010 PT Time Calculation (min) (ACUTE ONLY): 34 min  Charges:  $Therapeutic Activity: 8-22 mins                     08/03/2018  Donnella Sham, PT Acute Rehabilitation Services [phone]  (pager) [phone]  (office)   Tessie Fass Kashton Mcartor 08/03/2018, 4:11 PM

## 2018-08-04 LAB — GLUCOSE, CAPILLARY
GLUCOSE-CAPILLARY: 107 mg/dL — AB (ref 70–99)
GLUCOSE-CAPILLARY: 111 mg/dL — AB (ref 70–99)
Glucose-Capillary: 109 mg/dL — ABNORMAL HIGH (ref 70–99)
Glucose-Capillary: 115 mg/dL — ABNORMAL HIGH (ref 70–99)
Glucose-Capillary: 146 mg/dL — ABNORMAL HIGH (ref 70–99)
Glucose-Capillary: 185 mg/dL — ABNORMAL HIGH (ref 70–99)
Glucose-Capillary: 195 mg/dL — ABNORMAL HIGH (ref 70–99)
Glucose-Capillary: 209 mg/dL — ABNORMAL HIGH (ref 70–99)

## 2018-08-04 LAB — BASIC METABOLIC PANEL
ANION GAP: 17 — AB (ref 5–15)
BUN: 30 mg/dL — ABNORMAL HIGH (ref 8–23)
CALCIUM: 9.7 mg/dL (ref 8.9–10.3)
CHLORIDE: 96 mmol/L — AB (ref 98–111)
CO2: 24 mmol/L (ref 22–32)
CREATININE: 1.17 mg/dL — AB (ref 0.44–1.00)
GFR calc non Af Amer: 47 mL/min — ABNORMAL LOW (ref >60)
GFR, EST AFRICAN AMERICAN: 55 mL/min — AB (ref >60)
Glucose, Bld: 136 mg/dL — ABNORMAL HIGH (ref 70–99)
Potassium: 4.6 mmol/L (ref 3.5–5.1)
Sodium: 137 mmol/L (ref 135–145)

## 2018-08-04 LAB — CBC
HEMATOCRIT: 35.9 % — AB (ref 36.0–46.0)
HEMOGLOBIN: 10.8 g/dL — AB (ref 12.0–15.0)
MCH: 26.9 pg (ref 26.0–34.0)
MCHC: 30.1 g/dL (ref 30.0–36.0)
MCV: 89.3 fL (ref 80.0–100.0)
NRBC: 0 % (ref 0.0–0.2)
Platelets: 470 10*3/uL — ABNORMAL HIGH (ref 150–400)
RBC: 4.02 MIL/uL (ref 3.87–5.11)
RDW: 15.6 % — ABNORMAL HIGH (ref 11.5–15.5)
WBC: 8.1 10*3/uL (ref 4.0–10.5)

## 2018-08-04 LAB — HEPARIN LEVEL (UNFRACTIONATED): HEPARIN UNFRACTIONATED: 0.53 [IU]/mL (ref 0.30–0.70)

## 2018-08-04 MED ORDER — RESOURCE THICKENUP CLEAR PO POWD
ORAL | Status: DC | PRN
  Filled 2018-08-04 (×2): qty 125

## 2018-08-04 MED ORDER — FREE WATER
400.0000 mL | Freq: Three times a day (TID) | Status: DC
  Administered 2018-08-04 – 2018-08-08 (×12): 400 mL

## 2018-08-04 NOTE — Progress Notes (Signed)
PROGRESS NOTE  [name] FVC:[id number] DOB: Dec 04, 1950 DOA: 07/13/2018 PCP: [name], MD  HPI/Recap of past 57 hours: 67 year old F w/ a history of prior CVAs with carotid artery disease, hypertension, hyperlipidemia, normal pressure hydrocephalus status post shunt who was admitted with seizures and a new left MCA territory stroke. Treated with heparin and antiplatelets. Had recurrent seizures and transferred to ICU 9/29 for Versed and seizure suppression. Intubated and ventilated 9/29.   08/03/2018: Patient seen and examined at her bedside.  She is alert and oriented x2.  Cord track in place and head of bed at an elevated angle.  Denies nausea.  08/04/2018: Patient seen and examined with daughter at bedside.  She is somnolent however easily arousable to voices.  Does not appear to be in any acute distress.  No acute events overnight.  She has no new complaint.  Interventional radiology consulted for possible PEG tube placement.  Currently on trickle feeds, will advance as tolerated.  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 Neurology consulted and following Anticoagulation as recommended by neurology On heparin drip due to possible PEG tube placement  AKI No history of prior CKD Baseline creatinine 0.7 Creatinine 1.17 with GFR of 47 and BUN of 30 Suspect prerenal from dehydration Increase free water flushes to 400 cc every 8 hours Monitor urine output  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  no reported recurrent seizures Continue anti-seizures medications Seizure precautions  Severe dysphagia status post cortrack placement/feeding Speech therapist following Interventional radiology consulted for possible PEG tube placement Currently on trickle feeds advance as tolerated  Suspected chemical pneumonitis from possible aspiration No sign of systemic involvement Monitor fever curve and WBC Deep suctioning by RT twice daily  Hyperkalemia, improving Potassium 4.6 from 5.4  Chronic normocytic anemia Hemoglobin appears to be at baseline MCV 88 No sign of overt bleeding Repeat CBC in the morning  Ambulatory dysfunction PT recommended SNF Social worker consulted for possible SNF placement Fall precaution Continue PT OT       Code Status: DNR  Family Communication: None at bedside  Disposition Plan: Home versus CIR possibly 1 to 2 days   Consultants:  Neurology  Procedures:  None  Antimicrobials:  None  DVT prophylaxis: Heparin drip   Objective: Vitals:   08/04/18 0431 08/04/18 0500 08/04/18 0931 08/04/18 1224  BP: 136/71  (!) 135/56 (!) 130/59  Pulse: 91  89 84  Resp: 18  18 18   Temp: 98.4 F (36.9 C)  97.6 F (36.4 C) (!) 97.5 F (36.4 C)  TempSrc: Axillary  Axillary Axillary  SpO2:   100% 97%  Weight:  58.8 kg    Height:        Intake/Output Summary (Last 24 hours) at 08/04/2018 1409 Last data filed at 08/04/2018 0300 Gross per 24 hour  Intake 725.88 ml  Output -  Net 725.88 ml   Filed Weights   08/01/18 2100 08/02/18 2100 08/04/18 0500  Weight: 59 kg 58 kg 58.8 kg    Exam:  .  General: 67 y.o. year-old female well-developed well-nourished in no acute distress.  Somnolent but easily arousable to voices.  Cord track in place. . Cardiovascular: Regular rate and rhythm with no rubs or gallops.  No JVD or thyromegaly noted..   . Respiratory: Clear to auscultation with no wheezes or rales. Good inspiratory  effort. . Abdomen: Soft nontender nondistended with normal bowel sounds x4 quadrants. . Musculoskeletal: No lower extremity edema. 2/4 pulses in all 4 extremities. Marland Kitchen Psychiatry: Mood is appropriate for condition and setting   Data Reviewed: CBC: Recent Labs  Lab 07/30/18 0352 07/31/18 0536 08/01/18 0402 08/03/18 0543 08/04/18 0322  WBC 9.7 11.9* 15.5* 11.1* 8.1  HGB 10.0* 10.4* 10.8* 11.3* 10.8*  HCT 32.6* 33.5* 34.9* 37.0 35.9*  MCV 89.8 88.4 88.1 88.3 89.3  PLT 437* 465* 526* 528* 425*   Basic Metabolic Panel: Recent Labs  Lab 07/29/18 0339 08/03/18 0543 08/03/18 2225 08/04/18 0322  NA 141 136  --  137  K 4.1 5.4* 4.3 4.6  CL 104 95*  --  96*  CO2 28 26  --  24  GLUCOSE 152* 112*  --  136*  BUN 16 29*  --  30*  CREATININE 0.63 1.01*  --  1.17*  CALCIUM 8.8* 9.9  --  9.7   GFR: Estimated Creatinine Clearance: 37.4 mL/min (A) (by C-G formula based on SCr of 1.17 mg/dL (H)). Liver Function Tests: No results for input(s): AST, ALT, ALKPHOS, BILITOT, PROT, ALBUMIN in the last 168 hours. No results for input(s): LIPASE, AMYLASE in the last 168 hours. No results for input(s): AMMONIA in the last 168 hours. Coagulation Profile: No results for input(s): INR, PROTIME in the last 168 hours. Cardiac Enzymes: No results for input(s): CKTOTAL, CKMB, CKMBINDEX, TROPONINI in the last 168 hours. BNP (last 3 results) No results for input(s): PROBNP in the last 8760 hours. HbA1C: No results for input(s): HGBA1C in the last 72 hours. CBG: Recent Labs  Lab 08/03/18 1957 08/04/18 0012 08/04/18 0423 08/04/18 0923 08/04/18 1144  GLUCAP 153* 185* 146* 111* 195*   Lipid Profile: No results for input(s): CHOL, HDL, LDLCALC, TRIG, CHOLHDL, LDLDIRECT in the last 72 hours. Thyroid Function Tests: No results for input(s): TSH, T4TOTAL, FREET4, T3FREE, THYROIDAB in the last 72 hours. Anemia Panel: No results for input(s): VITAMINB12, FOLATE, FERRITIN, TIBC, IRON, RETICCTPCT in the  last 72 hours. Urine analysis:    Component Value Date/Time   COLORURINE YELLOW 06/20/2017 0642   APPEARANCEUR HAZY (A) 06/20/2017 0642   LABSPEC 1.010 06/20/2017 0642   PHURINE 6.0 06/20/2017 0642   GLUCOSEU 50 (A) 06/20/2017 0642   HGBUR LARGE (A) 06/20/2017 0642   BILIRUBINUR NEGATIVE 06/20/2017 0642   KETONESUR NEGATIVE 06/20/2017 0642   PROTEINUR NEGATIVE 06/20/2017 0642   NITRITE POSITIVE (A) 06/20/2017 0642   LEUKOCYTESUR LARGE (A) 06/20/2017 0642   Sepsis Labs: @LABRCNTIP (procalcitonin:4,lacticidven:4)  )No results found for this or any previous visit (from the past 240 hour(s)).    Studies: No results found.  Scheduled Meds: . chlorhexidine  15 mL Mouth Rinse BID  . feeding supplement (JEVITY 1.2 CAL)  1,000 mL Per Tube Q24H  . feeding supplement (PRO-STAT SUGAR FREE 64)  30 mL Per Tube Daily  . free water  400 mL Per Tube Q8H  . gabapentin  300 mg Oral TID  . gemfibrozil  600 mg Per Tube BID AC  . insulin aspart  0-15 Units Subcutaneous Q4H  . insulin detemir  18 Units Subcutaneous QHS  .  lacosamide  200 mg Per Tube BID  . levETIRAcetam  1,500 mg Per Tube BID  . mouth rinse  15 mL Mouth Rinse q12n4p  . metoprolol tartrate  25 mg Per Tube BID  . valproic acid  500 mg Per Tube QID    Continuous Infusions: . sodium chloride 10 mL/hr at 07/28/18 2209  . heparin 750 Units/hr (08/04/18 1010)     LOS: 22 days     Kayleen Memos, MD [facility] Pager [phone]  If 7PM-7AM, please contact night-coverage [email or web address] Password [email or web address] 08/04/2018, 2:09 PM

## 2018-08-04 NOTE — Progress Notes (Signed)
ANTICOAGULATION CONSULT NOTE - Follow Up Consult  Pharmacy Consult for Heparin  Indication:  CVA and carotid thrombus  Allergies  Allergen Reactions  . Carbamazepine Hives, Shortness Of Breath and Other (See Comments)  . Penicillins Anaphylaxis, Other (See Comments) and Swelling    Mother, father and brother have history of anaphylaxis reaction to penicillin so pt does not take  My throat swells Familial history of anaphylaxis   . Statins Other (See Comments)    Severe muscle weakness, leg numbness, severe headaches, chest pain   . Tricyclic Antidepressants Other (See Comments)    IMPAIRED MEMORY  . Atorvastatin Other (See Comments)    Severe muscle weakness, leg numbness, severe headaches, chest pain   . Ezetimibe Other (See Comments)    Severe muscle weakness, leg numbness, severe headaches, chest pain   . Nortriptyline Other (See Comments)    IMPAIRED MEMORY  . Quinapril Hcl Other (See Comments)    Unknown reaction  . Rifampin Diarrhea  . Metoclopramide Nausea And Vomiting and Rash    Patient Measurements: Height: 5\' 2"  (157.5 cm) Weight: 129 lb 10.1 oz (58.8 kg) IBW/kg (Calculated) : 50.1 Heparin Dosing Weight:59kg  Vital Signs: Temp: 98.4 F (36.9 C) (10/17 0431) Temp Source: Axillary (10/17 0431) BP: 136/71 (10/17 0431) Pulse Rate: 91 (10/17 0431)  Labs: Recent Labs    08/02/18 0658 08/03/18 0543 08/04/18 0322  HGB  --  11.3* 10.8*  HCT  --  37.0 35.9*  PLT  --  528* 470*  HEPARINUNFRC 0.30 0.27* 0.53  CREATININE  --  1.01* 1.17*    Estimated Creatinine Clearance: 37.4 mL/min (A) (by C-G formula based on SCr of 1.17 mg/dL (H)).   Medications:  Scheduled:  . chlorhexidine  15 mL Mouth Rinse BID  . feeding supplement (JEVITY 1.2 CAL)  1,000 mL Per Tube Q24H  . feeding supplement (PRO-STAT SUGAR FREE 64)  30 mL Per Tube Daily  . free water  100 mL Per Tube Q8H  . gabapentin  300 mg Oral TID  . gemfibrozil  600 mg Per Tube BID AC  . insulin  aspart  0-15 Units Subcutaneous Q4H  . insulin detemir  18 Units Subcutaneous QHS  . lacosamide  200 mg Per Tube BID  . levETIRAcetam  1,500 mg Per Tube BID  . mouth rinse  15 mL Mouth Rinse q12n4p  . metoprolol tartrate  25 mg Per Tube BID  . valproic acid  500 mg Per Tube QID    Assessment: 64 yof continues on IV heparin for carotid thrombus and CVS.   Heparin level slightly supratherapeutic at 0.53 this AM, CBC stable and no bleeding noted.  Goal of Therapy:  Heparin level 0.3 - 0.5 units/mL Monitor platelets by anticoagulation protocol: Yes   Plan:  Decrease heparin to 750 units/hr F/u with AM labs Daily Heparin level, cbc ,s/s bleeding  Bertis Ruddy, PharmD Clinical Pharmacist Please check AMION for all [place] numbers 08/04/2018 8:40 AM

## 2018-08-04 NOTE — Progress Notes (Signed)
RT NOTE: RT NT suctioned patient per sat's and gurgling. Patient tolerated well. Sat's went up to 95% and HR 94. RT suctioned out white, clear, yellow secretions. RT will continue to monitor as needed.

## 2018-08-05 ENCOUNTER — Encounter (HOSPITAL_COMMUNITY): Payer: Self-pay | Admitting: Interventional Radiology

## 2018-08-05 ENCOUNTER — Inpatient Hospital Stay (HOSPITAL_COMMUNITY): Payer: Medicare Other

## 2018-08-05 HISTORY — PX: IR GASTROSTOMY TUBE MOD SED: IMG625

## 2018-08-05 LAB — BASIC METABOLIC PANEL
ANION GAP: 12 (ref 5–15)
BUN: 29 mg/dL — AB (ref 8–23)
CALCIUM: 9.5 mg/dL (ref 8.9–10.3)
CO2: 28 mmol/L (ref 22–32)
Chloride: 98 mmol/L (ref 98–111)
Creatinine, Ser: 0.97 mg/dL (ref 0.44–1.00)
GFR calc Af Amer: >60 mL/min (ref >60)
GFR, EST NON AFRICAN AMERICAN: 60 mL/min — AB (ref >60)
GLUCOSE: 121 mg/dL — AB (ref 70–99)
POTASSIUM: 3.4 mmol/L — AB (ref 3.5–5.1)
SODIUM: 138 mmol/L (ref 135–145)

## 2018-08-05 LAB — GLUCOSE, CAPILLARY
GLUCOSE-CAPILLARY: 235 mg/dL — AB (ref 70–99)
GLUCOSE-CAPILLARY: 105 mg/dL — AB (ref 70–99)
GLUCOSE-CAPILLARY: 129 mg/dL — AB (ref 70–99)
GLUCOSE-CAPILLARY: 151 mg/dL — AB (ref 70–99)
GLUCOSE-CAPILLARY: 94 mg/dL (ref 70–99)
Glucose-Capillary: 168 mg/dL — ABNORMAL HIGH (ref 70–99)
Glucose-Capillary: 49 mg/dL — ABNORMAL LOW (ref 70–99)

## 2018-08-05 LAB — CBC
HCT: 33.4 % — ABNORMAL LOW (ref 36.0–46.0)
Hemoglobin: 10.4 g/dL — ABNORMAL LOW (ref 12.0–15.0)
MCH: 27.7 pg (ref 26.0–34.0)
MCHC: 31.1 g/dL (ref 30.0–36.0)
MCV: 88.8 fL (ref 80.0–100.0)
NRBC: 0 % (ref 0.0–0.2)
PLATELETS: 579 10*3/uL — AB (ref 150–400)
RBC: 3.76 MIL/uL — ABNORMAL LOW (ref 3.87–5.11)
RDW: 15.7 % — AB (ref 11.5–15.5)
WBC: 9.5 10*3/uL (ref 4.0–10.5)

## 2018-08-05 LAB — HEPARIN LEVEL (UNFRACTIONATED): Heparin Unfractionated: 0.59 [IU]/mL (ref 0.30–0.70)

## 2018-08-05 LAB — PROTIME-INR
INR: 0.95
Prothrombin Time: 12.6 s (ref 11.4–15.2)

## 2018-08-05 MED ORDER — LIDOCAINE HCL 1 % IJ SOLN
INTRAMUSCULAR | Status: AC
  Filled 2018-08-05: qty 20

## 2018-08-05 MED ORDER — VANCOMYCIN HCL IN DEXTROSE 750-5 MG/150ML-% IV SOLN
INTRAVENOUS | Status: AC | PRN
  Administered 2018-08-05: 1000 mg via INTRAVENOUS

## 2018-08-05 MED ORDER — IOPAMIDOL (ISOVUE-300) INJECTION 61%
INTRAVENOUS | Status: AC
  Administered 2018-08-05: 20 mL
  Filled 2018-08-05: qty 50

## 2018-08-05 MED ORDER — FENTANYL CITRATE (PF) 100 MCG/2ML IJ SOLN
INTRAMUSCULAR | Status: AC | PRN
  Administered 2018-08-05: 50 ug via INTRAVENOUS

## 2018-08-05 MED ORDER — DEXTROSE-NACL 5-0.45 % IV SOLN
INTRAVENOUS | Status: DC
  Administered 2018-08-05 (×2): via INTRAVENOUS

## 2018-08-05 MED ORDER — DEXTROSE 50 % IV SOLN
INTRAVENOUS | Status: AC
  Administered 2018-08-05: 50 mL
  Filled 2018-08-05: qty 50

## 2018-08-05 MED ORDER — MIDAZOLAM HCL 2 MG/2ML IJ SOLN
INTRAMUSCULAR | Status: AC
  Filled 2018-08-05: qty 2

## 2018-08-05 MED ORDER — GLUCAGON HCL RDNA (DIAGNOSTIC) 1 MG IJ SOLR
INTRAMUSCULAR | Status: AC
  Filled 2018-08-05: qty 1

## 2018-08-05 MED ORDER — LIDOCAINE HCL (PF) 1 % IJ SOLN
INTRAMUSCULAR | Status: AC | PRN
  Administered 2018-08-05: 5 mL

## 2018-08-05 MED ORDER — GLUCAGON HCL RDNA (DIAGNOSTIC) 1 MG IJ SOLR
INTRAMUSCULAR | Status: AC | PRN
  Administered 2018-08-05: 1 mg via INTRAVENOUS

## 2018-08-05 MED ORDER — MIDAZOLAM HCL 2 MG/2ML IJ SOLN
INTRAMUSCULAR | Status: AC | PRN
  Administered 2018-08-05: 1 mg via INTRAVENOUS

## 2018-08-05 MED ORDER — VANCOMYCIN HCL IN DEXTROSE 1-5 GM/200ML-% IV SOLN
INTRAVENOUS | Status: AC
  Filled 2018-08-05: qty 200

## 2018-08-05 MED ORDER — FENTANYL CITRATE (PF) 100 MCG/2ML IJ SOLN
INTRAMUSCULAR | Status: AC
  Filled 2018-08-05: qty 2

## 2018-08-05 MED ORDER — APIXABAN 5 MG PO TABS
5.0000 mg | ORAL_TABLET | Freq: Two times a day (BID) | ORAL | Status: DC
  Administered 2018-08-05 – 2018-08-10 (×10): 5 mg via ORAL
  Filled 2018-08-05 (×12): qty 1

## 2018-08-05 MED ORDER — CEFAZOLIN SODIUM-DEXTROSE 2-4 GM/100ML-% IV SOLN
INTRAVENOUS | Status: AC
  Filled 2018-08-05: qty 100

## 2018-08-05 NOTE — Progress Notes (Signed)
PROGRESS NOTE  [name] ZOX:[id number] DOB: Dec 19, 1950 DOA: 07/13/2018 PCP: [name], MD  HPI/Recap of past 16 hours: 67 year old F w/ a history of prior CVAs with carotid artery disease, hypertension, hyperlipidemia, normal pressure hydrocephalus status post shunt who was admitted with seizures and a new left MCA territory stroke. Treated with heparin and antiplatelets. Had recurrent seizures and transferred to ICU 9/29 for Versed and seizure suppression. Intubated and ventilated 9/29.   08/03/2018: Patient seen and examined at her bedside.  She is alert and oriented x2.  Cord track in place and head of bed at an elevated angle.  Denies nausea.  08/04/2018: Patient seen and examined with daughter at bedside.  She is somnolent however easily arousable to voices.  Does not appear to be in any acute distress.  No acute events overnight.  She has no new complaint.  Interventional radiology consulted for possible PEG tube placement.  Currently on trickle feeds, will advance as tolerated.  08/05/18: seen with daughter at bedside. Somnolent but arousable to voices. No acute events overnight. Per daughter some confusion at night possibly from sundowning.  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 Neurology consulted and following Anticoagulation as recommended by neurology Switch back to eliquis post peg tube placement today  AKI, improving No history of prior CKD Baseline creatinine 0.7 Creatinine 1.17 with GFR  of 47 and BUN of 30 Suspect prerenal from dehydration Increase free water flushes to 400 cc every 8 hours Monitor urine output  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  no reported recurrent seizures Continue anti-seizures medications Seizure precautions  Severe dysphagia status post peg tube placement by IR on 08/05/18 Trickle feed, advance as tolerated C/w Free water flushes Nutritionist following Aspiration precautions Elevate head of bed during feeding at least 35 degree angle  Suspected chemical pneumonitis from possible aspiration No sign of systemic involvement Monitor fever curve and WBC Deep suctioning by RT twice daily  Hyperkalemia, improving Potassium 4.6 from 5.4  Chronic normocytic anemia Hemoglobin appears to be at baseline MCV 88 No sign of overt bleeding Repeat CBC in the morning  Ambulatory dysfunction PT recommended SNF Social worker consulted for possible SNF placement Fall precaution Continue PT OT       Code Status: DNR  Family Communication: None at bedside  Disposition Plan: Home versus CIR possibly 1 to 2 days   Consultants:  Neurology  IR  Procedures:  Peg tube placement 08/05/18  Antimicrobials:  None  DVT prophylaxis: eliquis    Objective: Vitals:   08/05/18 1315 08/05/18 1320 08/05/18 1325 08/05/18 1340  BP: (!) 153/73 (!) 171/75 140/62 (!) 150/61  Pulse: 87 89 84 84  Resp: 15 13 (!) 9 14  Temp:      TempSrc:      SpO2: 92% 100% 98% 100%  Weight:      Height:        Intake/Output Summary (Last 24 hours) at 08/05/2018 1558 Last data filed at 08/05/2018 0800 Gross per 24 hour  Intake 0 ml  Output -  Net 0 ml   Filed Weights   08/01/18 2100 08/02/18 2100 08/04/18 0500  Weight: 59 kg 58 kg 58.8 kg    Exam:  . General: 67 y.o. year-old female WD WN NAd somnolent but arousable to voices. . Cardiovascular: RRR no rubs or gallops no JVD or thyromegaly  . Respiratory: Clear to  auscultation with no wheezes or rales. Good inspiratory effort. . Abdomen: Soft nontender nondistended with normal bowel sounds x4 quadrants. . Musculoskeletal: No lower extremity edema. 2/4 pulses in all 4 extremities. Marland Kitchen Psychiatry: Mood is appropriate for condition and setting   Data Reviewed: CBC: Recent Labs  Lab 07/31/18 0536 08/01/18 0402 08/03/18 0543 08/04/18 0322 08/05/18 0353  WBC 11.9* 15.5* 11.1* 8.1 9.5  HGB 10.4* 10.8* 11.3* 10.8* 10.4*  HCT 33.5* 34.9* 37.0 35.9* 33.4*  MCV 88.4 88.1 88.3 89.3 88.8  PLT 465* 526* 528* 470* 161*   Basic Metabolic Panel: Recent Labs  Lab 08/03/18 0543 08/03/18 2225 08/04/18 0322 08/05/18 0705  NA 136  --  137 138  K 5.4* 4.3 4.6 3.4*  CL 95*  --  96* 98  CO2 26  --  24 28  GLUCOSE 112*  --  136* 121*  BUN 29*  --  30* 29*  CREATININE 1.01*  --  1.17* 0.97  CALCIUM 9.9  --  9.7 9.5   GFR: Estimated Creatinine Clearance: 45.1 mL/min (by C-G formula based on SCr of 0.97 mg/dL). Liver Function Tests: No results for input(s): AST, ALT, ALKPHOS, BILITOT, PROT, ALBUMIN in the last 168 hours. No results for input(s): LIPASE, AMYLASE in the last 168 hours. No results for input(s): AMMONIA in the last 168 hours. Coagulation Profile: Recent Labs  Lab 08/05/18 0353  INR 0.95   Cardiac Enzymes: No results for input(s): CKTOTAL, CKMB, CKMBINDEX, TROPONINI in the last 168 hours. BNP (last 3 results) No results for input(s): PROBNP in the last 8760 hours. HbA1C: No results for input(s): HGBA1C in the last 72 hours. CBG: Recent Labs  Lab 08/05/18 0031 08/05/18 0429 08/05/18 0512 08/05/18 0734 08/05/18 1136  GLUCAP 168* 49* 235* 105* 129*   Lipid Profile: No results for input(s): CHOL, HDL, LDLCALC, TRIG, CHOLHDL, LDLDIRECT in the last 72 hours. Thyroid Function Tests: No results for input(s): TSH, T4TOTAL, FREET4, T3FREE, THYROIDAB in the last 72 hours. Anemia Panel: No results for input(s): VITAMINB12, FOLATE,  FERRITIN, TIBC, IRON, RETICCTPCT in the last 72 hours. Urine analysis:    Component Value Date/Time   COLORURINE YELLOW 06/20/2017 0642   APPEARANCEUR HAZY (A) 06/20/2017 0642   LABSPEC 1.010 06/20/2017 0642   PHURINE 6.0 06/20/2017 0642   GLUCOSEU 50 (A) 06/20/2017 0642   HGBUR LARGE (A) 06/20/2017 0642   BILIRUBINUR NEGATIVE 06/20/2017 0642   KETONESUR NEGATIVE 06/20/2017 0642   PROTEINUR NEGATIVE 06/20/2017 0642   NITRITE POSITIVE (A) 06/20/2017 0642   LEUKOCYTESUR LARGE (A) 06/20/2017 0642   Sepsis Labs: @LABRCNTIP (procalcitonin:4,lacticidven:4)  )No results found for this or any previous visit (from the past 240 hour(s)).    Studies: Ir Gastrostomy Tube Mod Sed  Result Date: 08/05/2018 INDICATION: Stroke EXAM: PERC PLACEMENT GASTROSTOMY MEDICATIONS: Vancomycin 1 gm IV; Antibiotics were administered within 1 hour of the procedure. Glucagon 1 mg IV ANESTHESIA/SEDATION: Versed 1 mg IV; Fentanyl 50 mcg IV Moderate Sedation Time:  11 minutes The patient was continuously monitored during the procedure by the interventional radiology nurse under my direct supervision. CONTRAST:  None-administered into the gastric lumen. FLUOROSCOPY TIME:  Fluoroscopy  Time: 2 minutes 42 seconds (6 mGy). COMPLICATIONS: None immediate. PROCEDURE: The procedure, risks, benefits, and alternatives were explained to the patient. Questions regarding the procedure were encouraged and answered. The patient understands and consents to the procedure. The epigastrium was prepped with Betadine in a sterile fashion, and a sterile drape was applied covering the operative field. A sterile gown and sterile gloves were used for the procedure. A 5-French orogastric tube is placed under fluoroscopic guidance. Scout imaging of the abdomen confirms barium within the transverse colon. The stomach was distended with gas. Under fluoroscopic guidance, an 18 gauge needle was utilized to puncture the anterior wall of the body of the  stomach. An Amplatz wire was advanced through the needle passing a T fastener into the lumen of the stomach. The T fastener was secured for gastropexy. A 9-French sheath was inserted. A snare was advanced through the 9-French sheath. A Britta Mccreedy was advanced through the orogastric tube. It was snared then pulled out the oral cavity, pulling the snare, as well. The leading edge of the gastrostomy was attached to the snare. It was then pulled down the esophagus and out the percutaneous site. It was secured in place. Contrast was injected. The image demonstrates placement of a 20-French pull-through type gastrostomy tube into the body of the stomach. IMPRESSION: Successful 20 French pull-through gastrostomy. Electronically Signed   By: Marybelle Killings M.D.   On: 08/05/2018 15:01    Scheduled Meds: . apixaban  5 mg Oral BID  . chlorhexidine  15 mL Mouth Rinse BID  . feeding supplement (JEVITY 1.2 CAL)  1,000 mL Per Tube Q24H  . feeding supplement (PRO-STAT SUGAR FREE 64)  30 mL Per Tube Daily  . fentaNYL      . free water  400 mL Per Tube Q8H  . gabapentin  300 mg Oral TID  . gemfibrozil  600 mg Per Tube BID AC  . glucagon (human recombinant)      . insulin aspart  0-15 Units Subcutaneous Q4H  . insulin detemir  18 Units Subcutaneous QHS  . lacosamide  200 mg Per Tube BID  . levETIRAcetam  1,500 mg Per Tube BID  . lidocaine      . mouth rinse  15 mL Mouth Rinse q12n4p  . metoprolol tartrate  25 mg Per Tube BID  . midazolam      . valproic acid  500 mg Per Tube QID    Continuous Infusions: . sodium chloride 10 mL/hr at 07/28/18 2209  . dextrose 5 % and 0.45% NaCl 75 mL/hr at 08/05/18 0615  . vancomycin       LOS: 23 days     Kayleen Memos, MD [facility] Pager [phone]  If 7PM-7AM, please contact night-coverage [email or web address] Password [email or web address] 08/05/2018, 3:58 PM

## 2018-08-05 NOTE — Consult Note (Signed)
Chief Complaint: Patient was seen in consultation today for dysphagia  Referring Physician(s): Vinie Sill, NP   Supervising Physician: Marybelle Killings  Patient Status: [facility] - In-pt  History of Present Illness: [name] is a 67 y.o. female with past medical history of DM, GERD, HTN, pseudotumor cerebri with lumbar peritoneal shunt, and history of strokes dating back to 2018 who presented to [facility] ED with decreased responsiveness. MRI and CT showed new CVA. She was also found to have seizures/status epilepticus s/p burst suppression.  She continued with seizure-like activity and alerted mental status.  She has been followed by SLP for ongoing dysphagia.  Per most recent SLP evaluation, patient with ongoing aspiration and high risk.  Also, note she has had poor PO intake.  Patient and family have met with Palliative care team to discuss feeding tube and wish to proceed.  IR consulted for gastrostomy tube placement.  Her most recent CT Abdomen was in May 2017. She did have a previous gastrostomy tube placed in IR in 06/2017 which per daughter was ultimately not used for nutrition and was removed in 08/2017.  Imaging reviewed by Dr. Barbie Banner who approves patient for gastrostomy tube placement.   Past Medical History:  Diagnosis Date  . Anemia    takes iron supplement  . Anesthesia complication    disorientation due to pseudotumor  . Diabetes mellitus    IDDM  . Family history of anesthesia complication 91 yrs ago   brother stopped breathing for a minute or two  . GERD (gastroesophageal reflux disease)   . Headache(784.0)    due to pseudotumor; daily headache  . Hypercholesteremia    unable to tolerate statins  . Hypertension    under control with meds., has been on med. x 5 yr.  . Peripheral neuropathy   . PONV (postoperative nausea and vomiting)   . 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 Seizures ([place])    due to shunt failure; last seizure 2007  . Synovitis of  ankle 04/2013   left  . Wears dentures    full    Past Surgical History:  Procedure Laterality Date  . ANKLE ARTHROSCOPY Left 05/18/2013   Procedure: LEFT ANKLE ARTHROSCOPY WITH DEBRIDEMENT,  SUBTALAR OPEN DEBRIDEMENT ;  Surgeon: Wylene Simmer, MD;  Location: [place];  Service: Orthopedics;  Laterality: Left;  . APPENDECTOMY     as a child  . BALLOON DILATION N/A 07/04/2013   Procedure: BALLOON DILATION;  Surgeon: Garlan Fair, MD;  Location: [facility];  Service: Endoscopy;  Laterality: N/A;  . BLADDER SUSPENSION    . BREAST LUMPECTOMY W/ NEEDLE LOCALIZATION Left 05/22/2011  . CAROTID ANGIOGRAPHY Bilateral 06/03/2017   Procedure: Bilateral Carotid Angiography;  Surgeon: Conrad Negaunee, MD;  Location: [place] [facility];  Service: Cardiovascular;  Laterality: Bilateral;  . COLONOSCOPY WITH PROPOFOL N/A 05/14/2014   Procedure: COLONOSCOPY WITH PROPOFOL;  Surgeon: Garlan Fair, MD;  Location: [facility];  Service: Endoscopy;  Laterality: N/A;  . ESOPHAGEAL DILATION  06/23/2006; 08/05/2004  . ESOPHAGOGASTRODUODENOSCOPY N/A 07/04/2013   Procedure: ESOPHAGOGASTRODUODENOSCOPY (EGD);  Surgeon: Garlan Fair, MD;  Location: [facility];  Service: Endoscopy;  Laterality: N/A;  . ESOPHAGOGASTRODUODENOSCOPY (EGD) WITH PROPOFOL N/A 05/14/2014   Procedure: ESOPHAGOGASTRODUODENOSCOPY (EGD) WITH PROPOFOL;  Surgeon: Garlan Fair, MD;  Location: [facility];  Service: Endoscopy;  Laterality: N/A;  . IR ANGIO VERTEBRAL SEL SUBCLAVIAN INNOMINATE UNI R MOD SED  06/03/2017  . IR  GASTROSTOMY TUBE MOD SED  06/25/2017  . IR GASTROSTOMY TUBE REMOVAL  08/20/2017  . IR PERCUTANEOUS ART THROMBECTOMY/INFUSION INTRACRANIAL INC DIAG ANGIO  06/03/2017  . IR PERCUTANEOUS ART THROMBECTOMY/INFUSION INTRACRANIAL INC DIAG ANGIO  06/03/2017  . IR RADIOLOGIST EVAL & MGMT  09/07/2017  . LUMBAR PERITONEAL SHUNT     x 2  . OVARIAN CYST REMOVAL     age 26  . RADIOLOGY WITH ANESTHESIA N/A 06/03/2017    Procedure: RADIOLOGY WITH ANESTHESIA;  Surgeon: Luanne Bras, MD;  Location: [place];  Service: Radiology;  Laterality: N/A;  . SHUNT REVISION  2007  . TOTAL ABDOMINAL HYSTERECTOMY W/ BILATERAL SALPINGOOPHORECTOMY  1994   age 37s  . WRIST SURGERY Left 2014    [name]    Allergies: Carbamazepine; Penicillins; Statins; Tricyclic antidepressants; Atorvastatin; Ezetimibe; Nortriptyline; Quinapril hcl; Rifampin; and Metoclopramide  Medications: Prior to Admission medications   Medication Sig Start Date End Date Taking? Authorizing Provider  busPIRone (BUSPAR) 5 MG tablet Take 5 mg by mouth 3 (three) times daily. 03/12/18  Yes [provider]  CARTIA XT 180 MG 24 hr capsule Take 180 mg by mouth daily. 05/13/18  Yes [provider]  cloNIDine (CATAPRES) 0.1 MG tablet Take 0.1 mg by mouth 2 (two) times daily. 03/31/18  Yes [provider]  clopidogrel (PLAVIX) 75 MG tablet Take 1 tablet (75 mg total) by mouth daily with breakfast. 07/09/17  Yes Love, Ivan Anchors, PA-C  diclofenac sodium (VOLTAREN) 1 % GEL Apply 2 application topically 4 (four) times daily as needed (pain).    Yes [provider]  esomeprazole (NEXIUM) 40 MG capsule Take 40 mg by mouth daily as needed (acid reflux). Acid reflux   Yes [provider]  insulin glargine (LANTUS) 100 unit/mL SOPN Inject 0.2 mLs (20 Units total) into the skin at bedtime. Patient taking differently: Inject 22 Units into the skin at bedtime.  07/08/17  Yes Love, Ivan Anchors, PA-C  metFORMIN (GLUMETZA) 500 MG (MOD) 24 hr tablet Take 500 mg by mouth daily.    Yes [provider]  metoprolol succinate (TOPROL-XL) 100 MG 24 hr tablet Take 100 mg by mouth 2 (two) times daily.    Yes [provider]  PRESCRIPTION MEDICATION Take 20 mg by mouth See admin instructions. Domperidone 10 mg from [place]: Take 2 tablets (20 mg) by mouth three times daily with meals - for diabetic gastroparesis   Yes [provider]  prochlorperazine (COMPAZINE) 5 MG tablet Take 1 tablet (5 mg total) by mouth every 8 (eight) hours as needed for nausea or vomiting. Have been taking it with meals--wean as able Patient taking differently: Take 5 mg by mouth every 8 (eight) hours as needed for nausea or vomiting.  07/08/17  Yes Love, Ivan Anchors, PA-C  ranitidine (ZANTAC) 75 MG tablet Take 75-150 mg by mouth daily.  03/07/18  Yes [provider]  sertraline (ZOLOFT) 50 MG tablet Take 1 tablet (50 mg total) by mouth daily. 05/18/18  Yes Melvenia Beam, MD  tiZANidine (ZANAFLEX) 4 MG tablet Take 4 mg by mouth 2 (two) times daily as needed. 06/30/18  Yes [provider]  topiramate (TOPAMAX) 50 MG tablet Take 1 tablet (50 mg total) by mouth 2 (two) times daily. 07/08/17  Yes Love, Ivan Anchors, PA-C  traMADol (ULTRAM) 50 MG tablet Take 50 mg by mouth 3 (three) times daily as needed for pain. 07/05/18  Yes [provider]  VICTOZA 18 MG/3ML SOPN Inject 1.2  mg into the skin at bedtime.  05/02/18  Yes [provider]  ACCU-CHEK COMPACT PLUS test strip  01/18/18   [provider]  acetaZOLAMIDE (DIAMOX) 250 MG tablet Take 1 tablet (250 mg total) by mouth 3 (three) times daily. 07/16/17   Melvenia Beam, MD     Family History  Problem Relation Age of Onset  . Heart disease Father   . Migraines Neg Hx     Social History   Socioeconomic History  . Marital status: Married    Spouse name: Simona Huh  . Number of children: 2  . Years of education: 83  . Highest education level: Not on file  Occupational History  . Not on file  Social Needs  . Financial resource strain: Not on file  . Food insecurity:    Worry: Not on file    Inability: Not on file  . Transportation needs:    Medical: Not on file    Non-medical: Not on file  Tobacco Use  . Smoking status: Never Smoker  . Smokeless tobacco: Never Used  Substance and Sexual Activity  . Alcohol use: Yes    Comment: occasional beer    . Drug use: No  . Sexual activity: Not on file  Lifestyle  . Physical activity:    Days per week: Not on file    Minutes per session: Not on file  . Stress: Not on file  Relationships  . Social connections:    Talks on phone: Not on file    Gets together: Not on file    Attends religious service: Not on file    Active member of club or organization: Not on file    Attends meetings of clubs or organizations: Not on file    Relationship status: Not on file  Other Topics Concern  . Not on file  Social History Narrative   Lives at home with husband and daughter   Caffeine use: soda daily   Right handed    Review of Systems: A 12 point ROS discussed and pertinent positives are indicated in the HPI above.  All other systems are negative.  Review of Systems  Constitutional: Negative for fatigue and fever.  Respiratory: Negative for cough and shortness of breath.   Cardiovascular: Negative for chest pain.  Gastrointestinal: Negative for abdominal pain.  Musculoskeletal: Negative for back pain.  Psychiatric/Behavioral: Negative for behavioral problems and confusion.    Vital Signs: BP 131/81 (BP Location: Left Arm)   Pulse 79   Temp 98.5 F (36.9 C) (Axillary)   Resp 19   Ht 5' 2"  (1.575 m)   Wt 129 lb 10.1 oz (58.8 kg)   SpO2 93%   BMI 23.71 kg/m   Physical Exam  Constitutional: She is oriented to person, place, and time. She appears well-developed. No distress.  Cardiovascular: Normal rate and regular rhythm. Exam reveals no friction rub.  No murmur heard. Pulmonary/Chest: Effort normal and breath sounds normal. No respiratory distress.  Abdominal: Soft. She exhibits no distension. There is no tenderness.  Upper quadrant scar left of midline likely from previous gastrostomy tube placement.   Neurological: She is alert and oriented to person, place, and time.  Skin: Skin is warm and dry. She is not diaphoretic.  Psychiatric: She has a normal mood and affect. Her  behavior is normal. Judgment and thought content normal.  Nursing note and vitals reviewed.    MD Evaluation Airway: WNL Heart: WNL Abdomen: WNL Chest/ Lungs: WNL ASA  Classification: 3 Mallampati/Airway Score: Two   Imaging: Ct Angio Head W Or Wo Contrast  Result Date: 07/14/2018 CLINICAL DATA:  Recent infarcts. EXAM: CT ANGIOGRAPHY HEAD AND NECK TECHNIQUE: Multidetector CT imaging of the head and neck was performed using the standard protocol during bolus administration of intravenous contrast. Multiplanar CT image reconstructions and MIPs were obtained to evaluate the vascular anatomy. Carotid stenosis measurements (when applicable) are obtained utilizing NASCET criteria, using the distal internal carotid diameter as the denominator. CONTRAST:  41m ISOVUE-370 IOPAMIDOL (ISOVUE-370) INJECTION 76% COMPARISON:  Brain MRI 07/13/2018 CTA head 06/23/2017 CTA neck and head 06/03/2017 FINDINGS: CTA NECK FINDINGS AORTIC ARCH: There is mild calcific atherosclerosis of the aortic arch. There is no aneurysm, dissection or hemodynamically significant stenosis of the visualized ascending aorta and aortic arch. Conventional 3 vessel aortic branching pattern. The visualized proximal subclavian arteries are widely patent. RIGHT CAROTID SYSTEM: --Common carotid artery: Widely patent origin without common carotid artery dissection or aneurysm. --Internal carotid artery: No dissection, occlusion or aneurysm. Mild atherosclerotic calcification at the carotid bifurcation without hemodynamically significant stenosis. --External carotid artery: No acute abnormality. LEFT CAROTID SYSTEM: --Common carotid artery: Widely patent origin without common carotid artery dissection or aneurysm. --Internal carotid artery:There is eccentric fibrofatty plaque within the proximal internal carotid artery that causes a stenosis of approximately 60% by NASCET criteria. The distal internal carotid artery is widely patent. --External  carotid artery: No acute abnormality. VERTEBRAL ARTERIES: Left dominant configuration. Both origins are normal. No dissection, occlusion or flow-limiting stenosis to the vertebrobasilar confluence. SKELETON: There is no bony spinal canal stenosis. No lytic or blastic lesion. OTHER NECK: Normal pharynx, larynx and major salivary glands. No cervical lymphadenopathy. Unremarkable thyroid gland. UPPER CHEST: No pneumothorax or pleural effusion. No nodules or masses. CTA HEAD FINDINGS ANTERIOR CIRCULATION: --Intracranial internal carotid arteries: Atherosclerotic calcification of the internal carotid arteries at the skull base without hemodynamically significant stenosis. --Anterior cerebral arteries: Short segment high-grade stenosis versus occlusion of the proximal left A1 segment is unchanged. The anterior communicating artery is patent. Right A1 segment is normal. The A2 segments and distal branches are normal otherwise. The left MCA is normal. --Posterior communicating arteries: Present bilaterally. POSTERIOR CIRCULATION: --Basilar artery: Normal. --Posterior cerebral arteries: Normal. --Superior cerebellar arteries: Normal. --Inferior cerebellar arteries: Normal anterior and posterior inferior cerebellar arteries. VENOUS SINUSES: As permitted by contrast timing, patent. ANATOMIC VARIANTS: None DELAYED PHASE: No parenchymal contrast enhancement. Redemonstration of acute or subacute infarct in the left frontoparietal MCA territory. Review of the MIP images confirms the above findings. IMPRESSION: 1. No emergent large vessel occlusion. 2. Approximately 60% stenosis of the left internal carotid artery by NASCET measurements. The eccentric appearance of the low density plaque within the lumen is unchanged. 3. Severe stenosis of the proximal left A1 segment, slightly worsened. 4. Mild narrowing of the distal portion of the right MCA M1 segment. 5. Acute/subacute infarct of the left frontoparietal MCA territory. 6.   Aortic Atherosclerosis (ICD10-I70.0). Electronically Signed   By: KUlyses JarredM.D.   On: 07/14/2018 14:08   Dg Chest 2 View  Result Date: 07/13/2018 CLINICAL DATA:  Generalized weakness.  Hypertension. EXAM: CHEST - 2 VIEW COMPARISON:  June 23, 2017 FINDINGS: There is mild scarring in the left base with questionable small left pleural effusion. Lungs elsewhere clear. Heart is mildly enlarged with pulmonary vascularity normal. No adenopathy. There is aortic atherosclerosis. No evident bone lesions. IMPRESSION: Mild scarring left base with questionable small left pleural effusion. Lungs elsewhere clear. Mild cardiac  prominence. There is aortic atherosclerosis. Aortic Atherosclerosis (ICD10-I70.0). Electronically Signed   By: Lowella Grip III M.D.   On: 07/13/2018 13:13   Ct Head Wo Contrast  Result Date: 07/16/2018 CLINICAL DATA:  Follow up stroke. EXAM: CT HEAD WITHOUT CONTRAST TECHNIQUE: Contiguous axial images were obtained from the base of the skull through the vertex without intravenous contrast. COMPARISON:  MRI head July 13, 2018 FINDINGS: Moderately motion degraded examination. BRAIN: No intraparenchymal hemorrhage, mass effect nor midline shift. Involving acute on chronic LEFT frontoparietal infarcts. Small area RIGHT frontal parietal encephalomalacia. Old basal ganglia and cerebellar infarcts better demonstrated on prior MRI. No advanced parenchymal brain volume loss for age. No hydrocephalus. No abnormal extra-axial fluid collections. Basal cisterns are patent. VASCULAR: Mild calcific atherosclerosis of the carotid siphons. SKULL: No skull fracture. No significant scalp soft tissue swelling. SINUSES/ORBITS: Trace paranasal sinus mucosal thickening. Mastoid air cells are well aerated.The included ocular globes and orbital contents are non-suspicious. OTHER: Patient is edentulous. IMPRESSION: 1. Moderately motion degraded examination. 2. Acute on chronic LEFT MCA territory infarcts  without hemorrhagic conversion. 3. Old small RIGHT MCA territory infarcts. Old small supra and infratentorial infarcts better seen on prior MRI due to motion. Electronically Signed   By: Elon Alas M.D.   On: 07/16/2018 23:31   Ct Head Wo Contrast  Result Date: 07/13/2018 CLINICAL DATA:  Altered mental status EXAM: CT HEAD WITHOUT CONTRAST TECHNIQUE: Contiguous axial images were obtained from the base of the skull through the vertex without intravenous contrast. COMPARISON:  06/23/2017, 06/04/2017 FINDINGS: Brain: Brain atrophy noted. Chronic white matter microvascular changes. Progressive subcortical white matter scattered hypodensities throughout both cerebral hemispheres, worse in the left frontal lobe compatible with evolutionary changes of previous multifocal infarcts from 06/04/2017. No acute intracranial hemorrhage, mass lesion, definite new infarction, midline shift, herniation, hydrocephalus, or extra-axial fluid collection. No focal mass effect or edema. Cisterns are patent. Cerebellar atrophy as well. Vascular: Intracranial atherosclerosis.  No hyperdense vessel. Skull: Normal. Negative for fracture or focal lesion. Sinuses/Orbits: No acute finding. Other: None. IMPRESSION: Chronic brain atrophy and white matter microvascular ischemic changes. Evolutionary changes of multifocal subcortical infarcts bilaterally, largest in the high left frontal lobe when compared 06/04/2017 MRI. No acute intracranial hemorrhage. Electronically Signed   By: Jerilynn Mages.  Shick M.D.   On: 07/13/2018 13:07   Ct Angio Neck W Or Wo Contrast  Result Date: 07/14/2018 CLINICAL DATA:  Recent infarcts. EXAM: CT ANGIOGRAPHY HEAD AND NECK TECHNIQUE: Multidetector CT imaging of the head and neck was performed using the standard protocol during bolus administration of intravenous contrast. Multiplanar CT image reconstructions and MIPs were obtained to evaluate the vascular anatomy. Carotid stenosis measurements (when applicable)  are obtained utilizing NASCET criteria, using the distal internal carotid diameter as the denominator. CONTRAST:  52m ISOVUE-370 IOPAMIDOL (ISOVUE-370) INJECTION 76% COMPARISON:  Brain MRI 07/13/2018 CTA head 06/23/2017 CTA neck and head 06/03/2017 FINDINGS: CTA NECK FINDINGS AORTIC ARCH: There is mild calcific atherosclerosis of the aortic arch. There is no aneurysm, dissection or hemodynamically significant stenosis of the visualized ascending aorta and aortic arch. Conventional 3 vessel aortic branching pattern. The visualized proximal subclavian arteries are widely patent. RIGHT CAROTID SYSTEM: --Common carotid artery: Widely patent origin without common carotid artery dissection or aneurysm. --Internal carotid artery: No dissection, occlusion or aneurysm. Mild atherosclerotic calcification at the carotid bifurcation without hemodynamically significant stenosis. --External carotid artery: No acute abnormality. LEFT CAROTID SYSTEM: --Common carotid artery: Widely patent origin without common carotid artery dissection or aneurysm. --Internal  carotid artery:There is eccentric fibrofatty plaque within the proximal internal carotid artery that causes a stenosis of approximately 60% by NASCET criteria. The distal internal carotid artery is widely patent. --External carotid artery: No acute abnormality. VERTEBRAL ARTERIES: Left dominant configuration. Both origins are normal. No dissection, occlusion or flow-limiting stenosis to the vertebrobasilar confluence. SKELETON: There is no bony spinal canal stenosis. No lytic or blastic lesion. OTHER NECK: Normal pharynx, larynx and major salivary glands. No cervical lymphadenopathy. Unremarkable thyroid gland. UPPER CHEST: No pneumothorax or pleural effusion. No nodules or masses. CTA HEAD FINDINGS ANTERIOR CIRCULATION: --Intracranial internal carotid arteries: Atherosclerotic calcification of the internal carotid arteries at the skull base without hemodynamically  significant stenosis. --Anterior cerebral arteries: Short segment high-grade stenosis versus occlusion of the proximal left A1 segment is unchanged. The anterior communicating artery is patent. Right A1 segment is normal. The A2 segments and distal branches are normal otherwise. The left MCA is normal. --Posterior communicating arteries: Present bilaterally. POSTERIOR CIRCULATION: --Basilar artery: Normal. --Posterior cerebral arteries: Normal. --Superior cerebellar arteries: Normal. --Inferior cerebellar arteries: Normal anterior and posterior inferior cerebellar arteries. VENOUS SINUSES: As permitted by contrast timing, patent. ANATOMIC VARIANTS: None DELAYED PHASE: No parenchymal contrast enhancement. Redemonstration of acute or subacute infarct in the left frontoparietal MCA territory. Review of the MIP images confirms the above findings. IMPRESSION: 1. No emergent large vessel occlusion. 2. Approximately 60% stenosis of the left internal carotid artery by NASCET measurements. The eccentric appearance of the low density plaque within the lumen is unchanged. 3. Severe stenosis of the proximal left A1 segment, slightly worsened. 4. Mild narrowing of the distal portion of the right MCA M1 segment. 5. Acute/subacute infarct of the left frontoparietal MCA territory. 6.  Aortic Atherosclerosis (ICD10-I70.0). Electronically Signed   By: Ulyses Jarred M.D.   On: 07/14/2018 14:08   Mr Brain Wo Contrast  Result Date: 07/23/2018 CLINICAL DATA:  67 year old female with right upper extremity tremor. Acute on chronic left MCA infarcts at the end of last month. EXAM: MRI HEAD WITHOUT CONTRAST TECHNIQUE: Multiplanar, multiecho pulse sequences of the brain and surrounding structures were obtained without intravenous contrast. COMPARISON:  Head CT 07/16/2018 and earlier.  Brain MRI 07/13/2018. FINDINGS: Brain: New areas of cortical restricted diffusion in the posterior left MCA and MCA/PCA watershed territory since  07/13/2018. In particular, new involvement of the posterior left insula and inferior left parietal lobes (series 5, images 59 and 63). Expected evolution of the other scattered regional restricted diffusion since September. Combination of chronic encephalomalacia, developing encephalomalacia, and cytotoxic edema in the region. Petechial hemorrhage and hemosiderin has mildly progressed. No malignant hemorrhagic transformation. No significant mass effect. No contralateral right hemisphere or posterior fossa restricted diffusion. No deep gray matter involvement. But there is bilateral Patchy and confluent cerebral white matter T2 and FLAIR hyperintensity. There is a small area of chronic cortical encephalomalacia in the right inferior frontal gyrus. Hippocampal formations appear symmetric and normal. No midline shift, mass effect, evidence of mass lesion, ventriculomegaly, extra-axial collection. Cervicomedullary junction and pituitary are within normal limits. Vascular: Major intracranial vascular flow voids are stable. Skull and upper cervical spine: Negative. Normal bone marrow signal. Sinuses/Orbits: Stable and negative. Other: Mild new mastoid fluid bilaterally. Small volume retained secretions in the oropharynx on series 10, image 2. The patient is intubated. Negative nasopharynx. Otherwise stable visible internal auditory structures. Scalp and face soft tissues appear negative. IMPRESSION: 1. New patchy acute cortical infarcts in the posterior left MCA and left MCA/PCA watershed territory since  07/13/2018. Underlying subacute and chronic ischemia in the region. Petechial hemorrhage but no malignant hemorrhagic transformation or mass effect. 2. Stable brain parenchyma elsewhere. No other acute intracranial abnormality. 3. New trace mastoid effusions, probably related to intubation. Electronically Signed   By: Genevie Ann M.D.   On: 07/23/2018 12:07   Mr Brain Wo Contrast  Result Date: 07/13/2018 CLINICAL DATA:   Altered mental status, somnolent. History of stroke and RIGHT-sided weakness. History of seizures, pseudotumor cerebral with lumboperitoneal shunt, hypertension, hypercholesterolemia, diabetes. EXAM: MRI HEAD WITHOUT CONTRAST TECHNIQUE: Multiplanar, multiecho pulse sequences of the brain and surrounding structures were obtained without intravenous contrast. COMPARISON:  CT HEAD July 13, 2018 and MRI head June 04, 2017. FINDINGS: INTRACRANIAL CONTENTS: Multifocal reduced diffusion LEFT frontoparietal lobes with low ADC values. LEFT frontoparietal encephalomalacia with mild hemosiderin staining and chronic microhemorrhages. Old bilateral basal ganglia infarcts. Multifocal small areas RIGHT frontal encephalomalacia. Old small LEFT cerebellar infarct. Patchy supratentorial and pontine white matter FLAIR T2 hyperintensities. No midline shift, mass effect or masses. Mild parenchymal brain volume loss. No hydrocephalus. No abnormal extra-axial fluid collections. VASCULAR: Normal major intracranial vascular flow voids present at skull base. SKULL AND UPPER CERVICAL SPINE: No abnormal sellar expansion. No suspicious calvarial bone marrow signal. Craniocervical junction maintained. SINUSES/ORBITS: The mastoid air-cells and included paranasal sinuses are well-aerated.The included ocular globes and orbital contents are non-suspicious. Status post bilateral ocular lens implants. OTHER: Patient is edentulous. IMPRESSION: 1. Acute multifocal LEFT frontoparietal/MCA territory infarcts 2. Old LEFT greater than RIGHT MCA territory infarcts. Old basal ganglia and cerebellar infarcts. 3. Moderate chronic small vessel ischemic changes. 4. Mild parenchymal brain volume loss. 5. Acute findings discussed with and reconfirmed by Dr.ELLIOTT WENTZ on 07/13/2018 at 5:24 pm. Electronically Signed   By: Elon Alas M.D.   On: 07/13/2018 17:26   Dg Chest Port 1 View  Result Date: 08/02/2018 CLINICAL DATA:  Vomiting.  Evaluate for  aspiration. EXAM: PORTABLE CHEST 1 VIEW COMPARISON:  July 25, 2018 FINDINGS: There is opacity in the lateral left lung base which is mildly improved since July 25, 2018. A new feeding tube is identified, terminating below today's film. No other interval changes. IMPRESSION: Improving lateral left basilar infiltrate. Recommend follow-up to complete resolution. Insertion of a feeding tube, incompletely evaluated on this study. Electronically Signed   By: Dorise Bullion III M.D   On: 08/02/2018 12:02   Dg Chest Port 1 View  Result Date: 07/25/2018 CLINICAL DATA:  Respiratory failure. EXAM: PORTABLE CHEST 1 VIEW COMPARISON:  07/24/2018. FINDINGS: Trachea is midline. Heart size stable. Increasing left basilar volume loss with a left pleural effusion and elevation of the left hemidiaphragm. Right lung is clear. A small bore catheter projects over the lateral right upper quadrant. IMPRESSION: 1. Increasing left basilar atelectasis. Possibility of mucus plugging is considered. Follow-up to clearing is recommended as a centrally obstructing lesion cannot be definitively excluded. 2. Small left pleural effusion. Electronically Signed   By: Lorin Picket M.D.   On: 07/25/2018 07:24   Dg Chest Port 1 View  Result Date: 07/24/2018 CLINICAL DATA:  Abnormal breath sounds. EXAM: PORTABLE CHEST 1 VIEW COMPARISON:  Earlier same day. 07/23/2018, 07/22/2018 and 06/23/2017. FINDINGS: 1629 hours. Low lung volumes. The lungs are clear without focal pneumonia, edema, pneumothorax or pleural effusion. Similar appearance of retrocardiac streaky opacity suggesting atelectasis. Right hilar prominence is unchanged comparing back to 06/23/2017. The visualized bony structures of the thorax are intact. Telemetry leads overlie the chest. IMPRESSION: Stable exam. Retrocardiac left  base atelectasis. No findings to suggest pulmonary edema. Electronically Signed   By: Misty Stanley M.D.   On: 07/24/2018 16:59   Dg Chest Port 1  View  Result Date: 07/24/2018 CLINICAL DATA:  Respiratory failure EXAM: PORTABLE CHEST 1 VIEW COMPARISON:  07/23/2018 FINDINGS: Endotracheal tube and NG tube are unchanged. Heart is borderline in size. Minimal left base atelectasis. Right lung clear. No effusions or acute bony abnormality. IMPRESSION: Left base atelectasis. Electronically Signed   By: Rolm Baptise M.D.   On: 07/24/2018 07:19   Dg Chest Port 1 View  Result Date: 07/23/2018 CLINICAL DATA:  Acute respiratory failure with hypoxia. EXAM: PORTABLE CHEST 1 VIEW COMPARISON:  07/22/2018. FINDINGS: Endotracheal to tip 2 cm above the carina. Nasogastric tube tip and side hole in the stomach. Normal sized heart. Tortuous and partially calcified thoracic aorta. Mild left basilar atelectasis with improvement. Clear right lung. Lower thoracic spine degenerative changes. IMPRESSION: Mild left basilar atelectasis with improvement. Electronically Signed   By: Claudie Revering M.D.   On: 07/23/2018 07:44   Dg Chest Port 1 View  Result Date: 07/22/2018 CLINICAL DATA:  Respiratory failure EXAM: PORTABLE CHEST 1 VIEW COMPARISON:  07/21/2018 FINDINGS: Endotracheal tube retracted. Tip is now 2 cm from the carina. Stable NG tube. Heart is mildly enlarged. Normal vascularity. Bibasilar atelectasis unchanged. No pneumothorax. IMPRESSION: Endotracheal tube is 2.0 cm above the carina. Bibasilar atelectasis. Electronically Signed   By: Marybelle Killings M.D.   On: 07/22/2018 08:16   Dg Chest Port 1 View  Result Date: 07/21/2018 CLINICAL DATA:  Respiratory failure. EXAM: PORTABLE CHEST 1 VIEW COMPARISON:  07/19/2018. FINDINGS: Patient rotated to the right. Endotracheal tube tip noted just above the carina with its tip directed toward the right mainstem bronchus. Proximal repositioning of approximately 3-4 cm suggested. Bibasilar subsegmental atelectasis again noted. Left base pleural thickening again noted. No pneumothorax. Heart size normal. IMPRESSION: 1. Endotracheal tube  tip noted just above the carina with its tip directed toward the right mainstem bronchus. Proximal repositioning of approximately 3- 4 cm suggested. 2. Bibasilar subsegmental atelectasis. Left base pleural thickening consistent scarring again noted. Critical Value/emergent results were called by telephone at the time of interpretation on 07/21/2018 at 8:28 am to nurse [facility], who verbally acknowledged these results. Electronically Signed   By: Marcello Moores  Register   On: 07/21/2018 08:30   Dg Chest Port 1 View  Result Date: 07/19/2018 CLINICAL DATA:  Acute respiratory failure EXAM: PORTABLE CHEST 1 VIEW COMPARISON:  Portable chest x-ray of 07/18/2017 and 07/13/2017 FINDINGS: The tip of the endotracheal tube is approximately 1.7 cm above the carina. The lungs appear relatively well aerated. A small left pleural effusion cannot be excluded and there is some elevation left hemidiaphragm. Heart size is stable and NG tube extends into the stomach. IMPRESSION: 1. Tip of endotracheal tube 1.7 cm above the carina. 2. Mild volume loss at the left lung base with probable small left effusion. Electronically Signed   By: Ivar Drape M.D.   On: 07/19/2018 08:06   Dg Chest Port 1 View  Result Date: 07/18/2018 CLINICAL DATA:  Intubated EXAM: PORTABLE CHEST 1 VIEW COMPARISON:  07/17/2018 FINDINGS: Endotracheal tube is 2 cm above the carina. Small left pleural effusion or pleural thickening, stable. Heart is normal size. No confluent opacities or effusions. IMPRESSION: Suspect small left pleural effusion or pleural thickening, stable. Electronically Signed   By: Rolm Baptise M.D.   On: 07/18/2018 08:21   Dg Chest [facility] [address]  Result Date: 07/17/2018 CLINICAL DATA:  Intubation EXAM: PORTABLE CHEST 1 VIEW COMPARISON:  07/17/2018 FINDINGS: Endotracheal tube with the tip 2 cm above the carina. Nasogastric tube projecting over the stomach. Small left pleural effusion or thickening. No right pleural effusion. No focal consolidation.  No pneumothorax. Stable cardiomediastinal silhouette. No acute osseous abnormality. IMPRESSION: 1. Endotracheal tube with the tip 2 cm above the carina. 2. Nasogastric tube projecting over the stomach. 3. Small left pleural effusion or thickening. Electronically Signed   By: Kathreen Devoid   On: 07/17/2018 15:12   Dg Chest Port 1 View  Result Date: 07/17/2018 CLINICAL DATA:  Shortness of breath, seizure. EXAM: PORTABLE CHEST 1 VIEW COMPARISON:  Chest radiograph July 13, 2018 FINDINGS: Similar hazy density LEFT lung base with blunting of the costophrenic angle. RIGHT lung is clear. Cardiomediastinal silhouette is normal. No pneumothorax. Catheters RIGHT upper quadrant. IMPRESSION: LEFT lung base suspected scarring with small LEFT pleural effusion versus pleural thickening. Electronically Signed   By: Elon Alas M.D.   On: 07/17/2018 01:16   Dg Abd Portable 1v  Result Date: 08/02/2018 CLINICAL DATA:  Vomiting. EXAM: PORTABLE ABDOMEN - 1 VIEW COMPARISON:  07/25/2018. FINDINGS: Surgical tubing noted over the right abdomen. Feeding tube noted with tip over distal stomach or proximal duodenum. No bowel distention. No acute bony abnormality. IMPRESSION: Feeding tube noted with tip over distal stomach or proximal duodenum. Electronically Signed   By: Marcello Moores  Register   On: 08/02/2018 12:04   Dg Abd Portable 1v  Result Date: 07/25/2018 CLINICAL DATA:  Feeding tube placement. EXAM: PORTABLE ABDOMEN - 1 VIEW COMPARISON:  07/21/2018. FINDINGS: Feeding tube tip in the region of the duodenal bulb. Additional smaller caliber tubing overlying the abdomen. Normal bowel gas pattern. Lumbar and lower thoracic spine degenerative changes. Stable small left pleural effusion. IMPRESSION: 1. Feeding tube tip in the region of the duodenal bulb. 2. Stable small left pleural effusion. Electronically Signed   By: Claudie Revering M.D.   On: 07/25/2018 12:52   Dg Abd Portable 1v  Result Date: 07/21/2018 CLINICAL DATA:   Orogastric tube placement EXAM: PORTABLE ABDOMEN - 1 VIEW COMPARISON:  June 23, 2017. FINDINGS: Orogastric tube tip and side port are in the stomach. Visualized bowel gas pattern unremarkable. No bowel obstruction or free air evident. The endotracheal tube tip is 2.9 cm above the carina. Visualized lungs are clear except for small left pleural effusion. There is aortic atherosclerosis. IMPRESSION: Orogastric tube tip and side port in stomach. Visualized bowel gas pattern unremarkable. Small left pleural effusion. Endotracheal tube as described. There is aortic atherosclerosis. Aortic Atherosclerosis (ICD10-I70.0). Electronically Signed   By: Lowella Grip III M.D.   On: 07/21/2018 12:02   Dg Swallowing Func-speech Pathology  Result Date: 08/03/2018 Objective Swallowing Evaluation: Type of Study: MBS-Modified Barium Swallow Study  Patient Details Name: LEILYNN PILAT MRN: [id number] Date of Birth: 02/26/1951 Today's Date: 08/03/2018 Time: SLP Start Time (ACUTE ONLY): 1323 -SLP Stop Time (ACUTE ONLY): 1345 SLP Time Calculation (min) (ACUTE ONLY): 22 min Past Medical History: Past Medical History: Diagnosis Date . Anemia   takes iron supplement . Anesthesia complication   disorientation due to pseudotumor . Diabetes mellitus   IDDM . Family history of anesthesia complication 23 yrs ago  brother stopped breathing for a minute or two . GERD (gastroesophageal reflux disease)  . Headache(784.0)   due to pseudotumor; daily headache . Hypercholesteremia   unable to tolerate statins . Hypertension   under control with meds., has  been on med. x 5 yr. . Peripheral neuropathy  . PONV (postoperative nausea and vomiting)  . 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 Synovitis of ankle 04/2013  left . Wears dentures   full Past Surgical History: Past Surgical History: Procedure Laterality Date . ANKLE ARTHROSCOPY Left 05/18/2013  Procedure: LEFT ANKLE  ARTHROSCOPY WITH DEBRIDEMENT,  SUBTALAR OPEN DEBRIDEMENT ;  Surgeon: Wylene Simmer, MD;  Location: [place];  Service: Orthopedics;  Laterality: Left; . APPENDECTOMY    as a child . BALLOON DILATION N/A 07/04/2013  Procedure: BALLOON DILATION;  Surgeon: Garlan Fair, MD;  Location: [facility];  Service: Endoscopy;  Laterality: N/A; . BLADDER SUSPENSION   . BREAST LUMPECTOMY W/ NEEDLE LOCALIZATION Left 05/22/2011 . CAROTID ANGIOGRAPHY Bilateral 06/03/2017  Procedure: Bilateral Carotid Angiography;  Surgeon: Conrad Caspar, MD;  Location: [place] [facility];  Service: Cardiovascular;  Laterality: Bilateral; . COLONOSCOPY WITH PROPOFOL N/A 05/14/2014  Procedure: COLONOSCOPY WITH PROPOFOL;  Surgeon: Garlan Fair, MD;  Location: [facility];  Service: Endoscopy;  Laterality: N/A; . ESOPHAGEAL DILATION  06/23/2006; 08/05/2004 . ESOPHAGOGASTRODUODENOSCOPY N/A 07/04/2013  Procedure: ESOPHAGOGASTRODUODENOSCOPY (EGD);  Surgeon: Garlan Fair, MD;  Location: [facility];  Service: Endoscopy;  Laterality: N/A; . ESOPHAGOGASTRODUODENOSCOPY (EGD) WITH PROPOFOL N/A 05/14/2014  Procedure: ESOPHAGOGASTRODUODENOSCOPY (EGD) WITH PROPOFOL;  Surgeon: Garlan Fair, MD;  Location: [facility];  Service: Endoscopy;  Laterality: N/A; . IR ANGIO VERTEBRAL SEL SUBCLAVIAN INNOMINATE UNI R MOD SED  06/03/2017 . IR GASTROSTOMY TUBE MOD SED  06/25/2017 . IR GASTROSTOMY TUBE REMOVAL  08/20/2017 . IR PERCUTANEOUS ART THROMBECTOMY/INFUSION INTRACRANIAL INC DIAG ANGIO  06/03/2017 . IR PERCUTANEOUS ART THROMBECTOMY/INFUSION INTRACRANIAL INC DIAG ANGIO  06/03/2017 . IR RADIOLOGIST EVAL & MGMT  09/07/2017 . LUMBAR PERITONEAL SHUNT    x 2 . OVARIAN CYST REMOVAL    age 10 . RADIOLOGY WITH ANESTHESIA N/A 06/03/2017  Procedure: RADIOLOGY WITH ANESTHESIA;  Surgeon: Luanne Bras, MD;  Location: [place];  Service: Radiology;  Laterality: N/A; . SHUNT REVISION  2007 . TOTAL ABDOMINAL HYSTERECTOMY W/ BILATERAL SALPINGOOPHORECTOMY  1994  age  27s . WRIST SURGERY Left 2014   [name] HPI: Pt is a 67 y.o. female with medical history significant of DM 2, CVA with right sided deficits and dysphagia requiring PEG tube, anemia, GERD, HLD, HTN seizures disorder, chronic refractory headaches, debility, sp PEG and Trach now removed. Admitted with decreased responsiveness. MRI showed acute and subacute infarcts in the left hemisphere in the left MCA distribution raising possibility of embolic source. Note that based on 05/2018 OP SLP therapy documentation, patient tolerating a regular diet, thin liquids. BSE completed 9/26 this admission recomending NPO pending MBS due to inconsistent s/s of aspiration. Pt declined MBS and was started on a trial of full liquids, but then developed seizure activity and was intubated 9/29. Extubated 10/6. MBS 10/8 recommending continue alternative means of nutrition but allowing snacks of purees to increase use of the hyolaryngeam musculature  Subjective: drowsy Assessment / Plan / Recommendation CHL IP CLINICAL IMPRESSIONS 08/03/2018 Clinical Impression Pt's oropharyngeal swallow remains grossly unchanged. She continues to have audible secretions, needing Max cues to expectorate with assistance from SLP using [place]. All liquids tested are aspirated with difficulty clearing from the airway. Although she maintained better airway protection with purees, I do not think she has the endurance or the arousal to consume a full meal tray in a safe manner.  I suspect that pt will need a more prolonged period of time before she will be able to sustain herself with a PO diet. Discussed this with her husband, who was present for the study. He admitted that he was disappointed, but acknowledged information presented. Would continue to keep her NPO except for a few bites of puree or pudding thick liquids (nothing thinner than these consistencies) when fully alert (pending additional conversations with palliative care). SLP Visit Diagnosis  Dysphagia, oropharyngeal phase (R13.12) Attention and concentration deficit following -- Frontal lobe and executive function deficit following -- Impact on safety and function Moderate aspiration risk;Risk for inadequate nutrition/hydration   CHL IP TREATMENT RECOMMENDATION 08/03/2018 Treatment Recommendations Therapy as outlined in treatment plan below   Prognosis 08/03/2018 Prognosis for Safe Diet Advancement Good Barriers to Reach Goals -- Barriers/Prognosis Comment -- CHL IP DIET RECOMMENDATION 08/03/2018 SLP Diet Recommendations Alternative means - long-term;Other (Comment) Liquid Administration via Spoon Medication Administration Via alternative means Compensations -- Postural Changes --   CHL IP OTHER RECOMMENDATIONS 08/03/2018 Recommended Consults -- Oral Care Recommendations Oral care QID Other Recommendations Have oral suction available   CHL IP FOLLOW UP RECOMMENDATIONS 08/03/2018 Follow up Recommendations Skilled Nursing facility   [facility] IP FREQUENCY AND DURATION 08/03/2018 Speech Therapy Frequency (ACUTE ONLY) min 2x/week Treatment Duration 2 weeks      CHL IP ORAL PHASE 08/03/2018 Oral Phase Impaired Oral - Pudding Teaspoon -- Oral - Pudding Cup -- Oral - Honey Teaspoon Weak lingual manipulation;Lingual/palatal residue Oral - Honey Cup -- Oral - Nectar Teaspoon Weak lingual manipulation;Lingual/palatal residue Oral - Nectar Cup -- Oral - Nectar Straw -- Oral - Thin Teaspoon -- Oral - Thin Cup -- Oral - Thin Straw -- Oral - Puree Weak lingual manipulation;Lingual/palatal residue;Delayed oral transit Oral - Mech Soft -- Oral - Regular -- Oral - Multi-Consistency -- Oral - Pill -- Oral Phase - Comment --  CHL IP PHARYNGEAL PHASE 08/03/2018 Pharyngeal Phase Impaired Pharyngeal- Pudding Teaspoon -- Pharyngeal -- Pharyngeal- Pudding Cup -- Pharyngeal -- Pharyngeal- Honey Teaspoon Reduced epiglottic inversion;Reduced anterior laryngeal mobility;Reduced laryngeal elevation;Reduced airway/laryngeal  closure;Penetration/Aspiration during swallow Pharyngeal Material enters airway, passes BELOW cords without attempt by patient to eject out (silent aspiration) Pharyngeal- Honey Cup -- Pharyngeal -- Pharyngeal- Nectar Teaspoon Reduced epiglottic inversion;Reduced anterior laryngeal mobility;Reduced laryngeal elevation;Reduced airway/laryngeal closure;Penetration/Aspiration during swallow Pharyngeal Material enters airway, passes BELOW cords without attempt by patient to eject out (silent aspiration) Pharyngeal- Nectar Cup -- Pharyngeal -- Pharyngeal- Nectar Straw -- Pharyngeal -- Pharyngeal- Thin Teaspoon -- Pharyngeal -- Pharyngeal- Thin Cup -- Pharyngeal -- Pharyngeal- Thin Straw -- Pharyngeal -- Pharyngeal- Puree Reduced epiglottic inversion;Reduced anterior laryngeal mobility;Reduced laryngeal elevation;Reduced airway/laryngeal closure;Penetration/Aspiration during swallow Pharyngeal Material enters airway, remains ABOVE vocal cords then ejected out Pharyngeal- Mechanical Soft -- Pharyngeal -- Pharyngeal- Regular -- Pharyngeal -- Pharyngeal- Multi-consistency -- Pharyngeal -- Pharyngeal- Pill -- Pharyngeal -- Pharyngeal Comment --  CHL IP CERVICAL ESOPHAGEAL PHASE 08/03/2018 Cervical Esophageal Phase WFL Pudding Teaspoon -- Pudding Cup -- Honey Teaspoon -- Honey Cup -- Nectar Teaspoon -- Nectar Cup -- Nectar Straw -- Thin Teaspoon -- Thin Cup -- Thin Straw -- Puree -- Mechanical Soft -- Regular -- Multi-consistency -- Pill -- Cervical Esophageal Comment -- [name] 08/03/2018, 2:14 PM [name], M.A. CCC-SLP Acute Rehabilitation Services Pager [phone] Office [phone]              Dg Swallowing Func-speech Pathology  Result Date: 07/26/2018 Objective Swallowing Evaluation: Type of Study: MBS-Modified Barium Swallow Study  Patient Details  Name: [name] MRN: [id number] Date of Birth: [birth year] Today's Date: 07/26/2018 Time: SLP Start Time (ACUTE ONLY): 1153 -SLP Stop Time  (ACUTE ONLY): 1211 SLP Time Calculation (min) (ACUTE ONLY): 18 min Past Medical History: Past Medical History: Diagnosis Date . Anemia   takes iron supplement . Anesthesia complication   disorientation due to pseudotumor . Diabetes mellitus   IDDM . Family history of anesthesia complication 29 yrs ago  brother stopped breathing for a minute or two . GERD (gastroesophageal reflux disease)  . Headache(784.0)   due to pseudotumor; daily headache . Hypercholesteremia   unable to tolerate statins . Hypertension   under control with meds., has been on med. x 5 yr. . Peripheral neuropathy  . PONV (postoperative nausea and vomiting)  . 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 Synovitis of ankle 04/2013  left . Wears dentures   full Past Surgical History: Past Surgical History: Procedure Laterality Date . ANKLE ARTHROSCOPY Left 05/18/2013  Procedure: LEFT ANKLE ARTHROSCOPY WITH DEBRIDEMENT,  SUBTALAR OPEN DEBRIDEMENT ;  Surgeon: Wylene Simmer, MD;  Location: [place];  Service: Orthopedics;  Laterality: Left; . APPENDECTOMY    as a child . BALLOON DILATION N/A 07/04/2013  Procedure: BALLOON DILATION;  Surgeon: Garlan Fair, MD;  Location: [facility];  Service: Endoscopy;  Laterality: N/A; . BLADDER SUSPENSION   . BREAST LUMPECTOMY W/ NEEDLE LOCALIZATION Left 05/22/2011 . CAROTID ANGIOGRAPHY Bilateral 06/03/2017  Procedure: Bilateral Carotid Angiography;  Surgeon: Conrad [place], MD;  Location: [place] [facility];  Service: Cardiovascular;  Laterality: Bilateral; . COLONOSCOPY WITH PROPOFOL N/A 05/14/2014  Procedure: COLONOSCOPY WITH PROPOFOL;  Surgeon: Garlan Fair, MD;  Location: [facility];  Service: Endoscopy;  Laterality: N/A; . ESOPHAGEAL DILATION  06/23/2006; 08/05/2004 . ESOPHAGOGASTRODUODENOSCOPY N/A 07/04/2013  Procedure: ESOPHAGOGASTRODUODENOSCOPY (EGD);  Surgeon: Garlan Fair, MD;  Location: [facility];  Service: Endoscopy;   Laterality: N/A; . ESOPHAGOGASTRODUODENOSCOPY (EGD) WITH PROPOFOL N/A 05/14/2014  Procedure: ESOPHAGOGASTRODUODENOSCOPY (EGD) WITH PROPOFOL;  Surgeon: Garlan Fair, MD;  Location: [facility];  Service: Endoscopy;  Laterality: N/A; . IR ANGIO VERTEBRAL SEL SUBCLAVIAN INNOMINATE UNI R MOD SED  06/03/2017 . IR GASTROSTOMY TUBE MOD SED  06/25/2017 . IR GASTROSTOMY TUBE REMOVAL  08/20/2017 . IR PERCUTANEOUS ART THROMBECTOMY/INFUSION INTRACRANIAL INC DIAG ANGIO  06/03/2017 . IR PERCUTANEOUS ART THROMBECTOMY/INFUSION INTRACRANIAL INC DIAG ANGIO  06/03/2017 . IR RADIOLOGIST EVAL & MGMT  09/07/2017 . LUMBAR PERITONEAL SHUNT    x 2 . OVARIAN CYST REMOVAL    age 7 . RADIOLOGY WITH ANESTHESIA N/A 06/03/2017  Procedure: RADIOLOGY WITH ANESTHESIA;  Surgeon: Luanne Bras, MD;  Location: [place];  Service: Radiology;  Laterality: N/A; . SHUNT REVISION  2007 . TOTAL ABDOMINAL HYSTERECTOMY W/ BILATERAL SALPINGOOPHORECTOMY  1994  age 82s . WRIST SURGERY Left 2014   [name] HPI: Pt is a 67 y.o. female with medical history significant of DM 2, CVA with right sided deficits and dysphagia requiring PEG tube, anemia, GERD, HLD, HTN seizures disorder, chronic refractory headaches, debility, sp PEG and Trach now removed. Admitted with decreased responsiveness. MRI showed acute and subacute infarcts in the left hemisphere in the left MCA distribution raising possibility of embolic source. Note that based on 05/2018 OP SLP therapy documentation, patient tolerating a regular diet, thin liquids. BSE completed 9/26 this admission recomending NPO pending MBS due to inconsistent s/s of aspiration. Pt declined MBS and was started on  a trial of full liquids, but then developed seizure activity and was intubated 9/29. Extubated 10/6.  Subjective: drowsy Assessment / Plan / Recommendation CHL IP CLINICAL IMPRESSIONS 07/26/2018 Clinical Impression Pt has a mild oral and moderate pharyngeal dysphagia impacted largely by motor deficits/reduced  strength. Lingual manipulation is mildly weak, resulting in decreased cohesion and longer oral transit times especially with purees. Pt also has reduced hyolaryngeal movement, epiglottic inversion, and airway closure, allowing nectar thick and honey thick liquid boluses to enter the laryngeal vestibule even when given small amounts. Also question if they are mixing with secretions given audible wetness. She appears to have either a delayed cough response and/or is coughing on secretions in between boluses, but her cough is not productive enough to clear her airway well. The weight of pureed boluses helps to mildly increase epiglottic inversion, enough so that she protects her airway well across all boluses of this textures. Recommend continuing alternative means of nutrition but allowing snacks of purees from the floor stock in order to increase use of the hyolaryngeam musculature. Pt may also beenfit from RMT to assist swallow and cough. Will follow for readiness to start diet versus need for repeat testing as overall alertness and endurance begin to improve. SLP Visit Diagnosis Dysphagia, oropharyngeal phase (R13.12) Attention and concentration deficit following -- Frontal lobe and executive function deficit following -- Impact on safety and function Moderate aspiration risk   CHL IP TREATMENT RECOMMENDATION 07/26/2018 Treatment Recommendations Therapy as outlined in treatment plan below   Prognosis 07/26/2018 Prognosis for Safe Diet Advancement Good Barriers to Reach Goals -- Barriers/Prognosis Comment -- CHL IP DIET RECOMMENDATION 07/26/2018 SLP Diet Recommendations Alternative means - temporary;Other (Comment) Liquid Administration via -- Medication Administration Via alternative means Compensations -- Postural Changes --   CHL IP OTHER RECOMMENDATIONS 07/26/2018 Recommended Consults -- Oral Care Recommendations Oral care QID Other Recommendations --   CHL IP FOLLOW UP RECOMMENDATIONS 07/26/2018 Follow up  Recommendations Inpatient Rehab   CHL IP FREQUENCY AND DURATION 07/26/2018 Speech Therapy Frequency (ACUTE ONLY) min 2x/week Treatment Duration 2 weeks      CHL IP ORAL PHASE 07/26/2018 Oral Phase Impaired Oral - Pudding Teaspoon -- Oral - Pudding Cup -- Oral - Honey Teaspoon Weak lingual manipulation Oral - Honey Cup -- Oral - Nectar Teaspoon Weak lingual manipulation;Decreased bolus cohesion Oral - Nectar Cup -- Oral - Nectar Straw -- Oral - Thin Teaspoon -- Oral - Thin Cup -- Oral - Thin Straw -- Oral - Puree Weak lingual manipulation;Decreased bolus cohesion;Delayed oral transit Oral - Mech Soft -- Oral - Regular -- Oral - Multi-Consistency -- Oral - Pill -- Oral Phase - Comment --  CHL IP PHARYNGEAL PHASE 07/26/2018 Pharyngeal Phase Impaired Pharyngeal- Pudding Teaspoon -- Pharyngeal -- Pharyngeal- Pudding Cup -- Pharyngeal -- Pharyngeal- Honey Teaspoon Reduced epiglottic inversion;Reduced anterior laryngeal mobility;Reduced laryngeal elevation;Reduced airway/laryngeal closure;Penetration/Aspiration during swallow Pharyngeal Material enters airway, CONTACTS cords and not ejected out Pharyngeal- Honey Cup -- Pharyngeal -- Pharyngeal- Nectar Teaspoon Reduced epiglottic inversion;Reduced anterior laryngeal mobility;Reduced laryngeal elevation;Reduced airway/laryngeal closure;Penetration/Aspiration during swallow Pharyngeal Material enters airway, CONTACTS cords and not ejected out Pharyngeal- Nectar Cup -- Pharyngeal -- Pharyngeal- Nectar Straw -- Pharyngeal -- Pharyngeal- Thin Teaspoon -- Pharyngeal -- Pharyngeal- Thin Cup -- Pharyngeal -- Pharyngeal- Thin Straw -- Pharyngeal -- Pharyngeal- Puree Reduced epiglottic inversion;Reduced anterior laryngeal mobility;Reduced laryngeal elevation;Reduced airway/laryngeal closure Pharyngeal -- Pharyngeal- Mechanical Soft -- Pharyngeal -- Pharyngeal- Regular -- Pharyngeal -- Pharyngeal- Multi-consistency -- Pharyngeal -- Pharyngeal- Pill -- Pharyngeal -- Pharyngeal Comment --  CHL IP CERVICAL ESOPHAGEAL PHASE 07/26/2018 Cervical Esophageal Phase WFL Pudding Teaspoon -- Pudding Cup -- Honey Teaspoon -- Honey Cup -- Nectar Teaspoon -- Nectar Cup -- Nectar Straw -- Thin Teaspoon -- Thin Cup -- Thin Straw -- Puree -- Mechanical Soft -- Regular -- Multi-consistency -- Pill -- Cervical Esophageal Comment -- [name] 07/26/2018, 2:10 PM  [name], M.A. CCC-SLP Acute Rehabilitation Services Pager [phone] Office [phone]             [place] Ekg Site Rite  Result Date: 07/18/2018 If [facility] image not attached, placement could not be confirmed due to current cardiac rhythm.   Labs:  CBC: Recent Labs    08/01/18 0402 08/03/18 0543 08/04/18 0322 08/05/18 0353  WBC 15.5* 11.1* 8.1 9.5  HGB 10.8* 11.3* 10.8* 10.4*  HCT 34.9* 37.0 35.9* 33.4*  PLT 526* 528* 470* 579*    COAGS: Recent Labs    07/13/18 1109 07/18/18 0106 07/19/18 0517 08/05/18 0353  INR 1.00 1.83 1.41 0.95  APTT 22* 143*  --   --     BMP: Recent Labs    07/29/18 0339 08/03/18 0543 08/03/18 2225 08/04/18 0322 08/05/18 0705  NA 141 136  --  137 138  K 4.1 5.4* 4.3 4.6 3.4*  CL 104 95*  --  96* 98  CO2 28 26  --  24 28  GLUCOSE 152* 112*  --  136* 121*  BUN 16 29*  --  30* 29*  CALCIUM 8.8* 9.9  --  9.7 9.5  CREATININE 0.63 1.01*  --  1.17* 0.97  GFRNONAA >60 57*  --  47* 60*  GFRAA >60 >60  --  55* >60    LIVER FUNCTION TESTS: Recent Labs    07/13/18 1109 07/17/18 0637 07/18/18 0235 07/27/18 0548  BILITOT 0.4 0.7 0.5 0.4  AST 27 25 19 19   ALT 23 17 13 16   ALKPHOS 121 96 80 72  PROT 7.2 6.0* 5.1* 6.3*  ALBUMIN 4.0 3.1* 2.6* 2.8*    TUMOR MARKERS: No results for input(s): AFPTM, CEA, CA199, CHROMGRNA in the last 8760 hours.  Assessment and Plan: Dysphagia Patient admitted with new CVA and seizures.  Prolonged admission due to AMS and continued seizure-like activity.  After [place] discussion with family, plan made to proceed with gastrostomy tube  placement.  IR consulted for placement.  Recent imaging reviewed by Dr. Barbie Banner.  Patient also with previous gastrostomy tube placement in 2018 in IR.  Heparin drip held this AM.  TF held overnight.  Proceed with placement in IR as schedule allows.  Patient available at bedside for consent.  Also aware that of plans to place next week if unable to accommodate today.   Risks and benefits discussed with the patient including, but not limited to the need for a barium enema during the procedure, bleeding, infection, peritonitis, or damage to adjacent structures.  All of the patient's questions were answered, patient is agreeable to proceed. Consent signed and in chart.  Thank you for this interesting consult.  I greatly enjoyed meeting MAN BONNEAU and look forward to participating in their care.  A copy of this report was sent to the requesting provider on this date.  Electronically Signed: Docia Barrier, PA 08/05/2018, 9:49 AM   I spent a total of 40 Minutes    in face to face in clinical consultation, greater than 50% of which was counseling/coordinating care for dysphagia.

## 2018-08-05 NOTE — Progress Notes (Signed)
ANTICOAGULATION CONSULT NOTE - Follow Up Consult  Pharmacy Consult for Heparin > Eliquis Indication:  CVA and carotid thrombus  Allergies  Allergen Reactions  . Carbamazepine Hives, Shortness Of Breath and Other (See Comments)  . Penicillins Anaphylaxis, Other (See Comments) and Swelling    Mother, father and brother have history of anaphylaxis reaction to penicillin so pt does not take  My throat swells Familial history of anaphylaxis   . Statins Other (See Comments)    Severe muscle weakness, leg numbness, severe headaches, chest pain   . Tricyclic Antidepressants Other (See Comments)    IMPAIRED MEMORY  . Atorvastatin Other (See Comments)    Severe muscle weakness, leg numbness, severe headaches, chest pain   . Ezetimibe Other (See Comments)    Severe muscle weakness, leg numbness, severe headaches, chest pain   . Nortriptyline Other (See Comments)    IMPAIRED MEMORY  . Quinapril Hcl Other (See Comments)    Unknown reaction  . Rifampin Diarrhea  . Metoclopramide Nausea And Vomiting and Rash    Patient Measurements: Height: 5\' 2"  (157.5 cm) Weight: 129 lb 10.1 oz (58.8 kg) IBW/kg (Calculated) : 50.1 Heparin Dosing Weight:59kg  Vital Signs: Temp: 97.5 F (36.4 C) (10/18 1137) Temp Source: Oral (10/18 1137) BP: 150/61 (10/18 1340) Pulse Rate: 84 (10/18 1340)  Labs: Recent Labs    08/03/18 0543 08/04/18 0322 08/05/18 0353 08/05/18 0705  HGB 11.3* 10.8* 10.4*  --   HCT 37.0 35.9* 33.4*  --   PLT 528* 470* 579*  --   LABPROT  --   --  12.6  --   INR  --   --  0.95  --   HEPARINUNFRC 0.27* 0.53 0.59  --   CREATININE 1.01* 1.17*  --  0.97    Estimated Creatinine Clearance: 45.1 mL/min (by C-G formula based on SCr of 0.97 mg/dL).   Medications:  Scheduled:  . chlorhexidine  15 mL Mouth Rinse BID  . feeding supplement (JEVITY 1.2 CAL)  1,000 mL Per Tube Q24H  . feeding supplement (PRO-STAT SUGAR FREE 64)  30 mL Per Tube Daily  . fentaNYL      . free  water  400 mL Per Tube Q8H  . gabapentin  300 mg Oral TID  . gemfibrozil  600 mg Per Tube BID AC  . glucagon (human recombinant)      . insulin aspart  0-15 Units Subcutaneous Q4H  . insulin detemir  18 Units Subcutaneous QHS  . lacosamide  200 mg Per Tube BID  . levETIRAcetam  1,500 mg Per Tube BID  . lidocaine      . mouth rinse  15 mL Mouth Rinse q12n4p  . metoprolol tartrate  25 mg Per Tube BID  . midazolam      . 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  Pt now s/p PEG placement and no further plans for procedures, primary asked to transition to Eliquis.  Pt has been on IV heparin therapy since 9/26 with tight goals of 0.3-0.5, considering length of IV anticoagulation and lower heparin goals will start Eliquis without load.    Goal of Therapy:  Monitor platelets by anticoagulation protocol: Yes   Plan:  Initiate Eliquis 5mg  BID at 2100 Monitor renal function and s/s bleeding  Bertis Ruddy, PharmD Clinical Pharmacist Please check AMION for all [place] numbers 08/05/2018 3:46 PM

## 2018-08-05 NOTE — Care Management Note (Signed)
Case Management Note  Patient Details  Name: [name] MRN: [id number] Date of Birth: [birth year]  Subjective/Objective:                    Action/Plan: Plan is for PEG tube today and SNF rehab when medically ready. CM signing off.   Expected Discharge Date:                  Expected Discharge Plan:  Skilled Nursing Facility  In-House Referral:  Clinical Social Work  Discharge planning Services  CM Consult  Post Acute Care Choice:    Choice offered to:     DME Arranged:    DME Agency:     HH Arranged:    [place] Agency:     Status of Service:  In process, will continue to follow  If discussed at Long Length of Stay Meetings, dates discussed:    Additional Comments:  Pollie Friar, RN 08/05/2018, 11:11 AM

## 2018-08-05 NOTE — NC FL2 (Signed)
[place] MEDICAID FL2 LEVEL OF CARE SCREENING TOOL     IDENTIFICATION  Patient Name: [name] Birthdate: February 15, 1951 Sex: female Admission Date (Current Location): 07/13/2018  [facility] and [place] Number:  Herbalist and Address:  [facility], [place] [address], [place], [place] [address]      Provider Number: [id number]  Attending Physician Name and Address:  Kayleen Memos, DO  Relative Name and Phone Number:       Current Level of Care: Hospital Recommended Level of Care: [place] Prior Approval Number:    Date Approved/Denied:   PASRR Number: [id number]  Discharge Plan: SNF    Current Diagnoses: Patient Active Problem List   Diagnosis Date Noted  . Aspiration into airway   . Advanced care planning/counseling discussion   . Goals of care, counseling/discussion   . Status epilepticus ([place])   . SOB (shortness of breath)   . Adjustment disorder with depressed mood 11/02/2017  . Adjustment disorder with mixed anxiety and depressed mood   . Hemoptysis   . Hematemesis   . Intractable vomiting with nausea   . Acute lower UTI   . Urinary retention   . Diabetic gastroparesis ([place])   . Vascular headache   . Diabetes mellitus ([place])   . CVA (cerebral vascular accident) ([place]) 06/18/2017  . Weakness   . 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 Right hemiparesis ([place])   . Encounter for intubation 06/16/2017  . Palliative care by specialist 06/16/2017  . Anxiety state   . Acute embolic stroke ([place])   . Benign essential HTN   . Diabetic peripheral neuropathy ([place])   . Pseudotumor cerebri   . Hypokalemia   . Seizure ([place])   . Leukocytosis   . Acute blood loss anemia   . Post-operative pain   . Encounter for feeding tube placement   . Respiratory failure ([place])   . Status post tracheostomy ([place])   . Acute respiratory failure with hypoxemia ([place])   . Stenosis of left carotid artery    . Acute CVA (cerebrovascular accident) ([place]) 05/31/2017  . Type II diabetes mellitus with complication ([place]) 66/29/4765  . Hypertension 05/31/2017  . Hyponatremia 05/31/2017  . IIH (idiopathic intracranial hypertension) 01/18/2016  . 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 Worsening headaches 01/18/2016  . Increased abdominal girth 01/18/2016  . Abdominal fluid collection 01/18/2016  . Vision changes 01/18/2016  . Perceived hearing changes 01/18/2016  . Nausea without vomiting 01/18/2016  . Peripheral vision loss 01/18/2016  . Imbalance 01/18/2016  . Falls 01/18/2016  . Dysphagia, oropharyngeal 01/18/2016  . Subacute confusional state 01/18/2016  . Left breast mass 05/05/2011    Orientation RESPIRATION BLADDER Height & Weight     Self, Situation, Place  O2(see DC summary) Incontinent Weight: 129 lb 10.1 oz (58.8 kg) Height:  5\' 2"  (157.5 cm)  BEHAVIORAL SYMPTOMS/MOOD NEUROLOGICAL BOWEL NUTRITION STATUS    Convulsions/Seizures Incontinent Feeding tube  AMBULATORY STATUS COMMUNICATION OF NEEDS Skin   Extensive Assist Verbally Normal                       Personal Care Assistance Level of Assistance  Bathing, Feeding, Dressing Bathing Assistance: Maximum assistance Feeding assistance: Maximum assistance Dressing Assistance: Maximum assistance     Functional Limitations Info  Sight, Hearing, Speech Sight Info: Adequate Hearing Info: Adequate Speech Info: Impaired(slurred, dysarthric)  SPECIAL CARE FACTORS FREQUENCY  PT (By licensed PT), OT (By licensed OT), Speech therapy     PT Frequency: 5x/wk OT Frequency: 5x/wk     Speech Therapy Frequency: 5x/wk      Contractures Contractures Info: Not present    Additional Factors Info  Code Status, Allergies Code Status Info: DNR Allergies Info: Carbamazepine, Penicillins, Statins, Tricyclic Antidepressants, Atorvastatin, Ezetimibe, Nortriptyline, Quinapril  Hcl, Rifampin, Metoclopramide           Current Medications (08/05/2018):  This is the current hospital active medication list Current Facility-Administered Medications  Medication Dose Route Frequency Provider Last Rate Last Dose  . 0.9 %  sodium chloride infusion   Intravenous Continuous Cherene Altes, MD 10 mL/hr at 07/28/18 2209    . acetaminophen (TYLENOL) solution 650 mg  650 mg Per Tube Q4H PRN Toy Baker, MD   650 mg at 08/05/18 0857  . apixaban (ELIQUIS) tablet 5 mg  5 mg Oral BID Bertis Ruddy, [facility]      . chlorhexidine (PERIDEX) 0.12 % solution 15 mL  15 mL Mouth Rinse BID Kipp Brood, MD   15 mL at 08/05/18 0849  . dextrose 5 %-0.45 % sodium chloride infusion   Intravenous Continuous Kayleen Memos, DO 75 mL/hr at 08/05/18 0615    . feeding supplement (JEVITY 1.2 CAL) liquid 1,000 mL  1,000 mL Per Tube Q24H Irene Pap N, DO 30 mL/hr at 08/04/18 1712 1,000 mL at 08/04/18 1712  . feeding supplement (PRO-STAT SUGAR FREE 64) liquid 30 mL  30 mL Per Tube Daily Gonfa, Taye T, MD   30 mL at 08/05/18 1438  . fentaNYL (SUBLIMAZE) 100 MCG/2ML injection           . free water 400 mL  400 mL Per Tube Q8H Hall, Carole N, DO   400 mL at 08/05/18 1443  . gabapentin (NEURONTIN) capsule 300 mg  300 mg Oral TID Minor, Grace Bushy, NP   300 mg at 08/05/18 0850  . gemfibrozil (LOPID) tablet 600 mg  600 mg Per Tube BID AC Cherene Altes, MD   600 mg at 08/05/18 0849  . glucagon (human recombinant) (GLUCAGEN) 1 MG injection           . insulin aspart (novoLOG) injection 0-15 Units  0-15 Units Subcutaneous Q4H Donne Hazel, MD   2 Units at 08/05/18 1140  . insulin detemir (LEVEMIR) injection 18 Units  18 Units Subcutaneous QHS Donne Hazel, MD   18 Units at 08/04/18 2156  . labetalol (NORMODYNE,TRANDATE) injection 10 mg  10 mg Intravenous Q2H PRN Etheleen Nicks, MD   10 mg at 07/26/18 1906  . lacosamide (VIMPAT) tablet 200 mg  200 mg Per Tube BID Cherene Altes, MD   200  mg at 08/05/18 0850  . levETIRAcetam (KEPPRA) 100 MG/ML solution 1,500 mg  1,500 mg Per Tube BID Cherene Altes, MD   1,500 mg at 08/05/18 0849  . lidocaine (XYLOCAINE) 1 % (with pres) injection           . LORazepam (ATIVAN) injection 1 mg  1 mg Intravenous Q4H PRN Donne Hazel, MD      . MEDLINE mouth rinse  15 mL Mouth Rinse q12n4p Kipp Brood, MD   15 mL at 08/04/18 1630  . metoprolol tartrate (LOPRESSOR) 25 mg/10 mL oral suspension 25 mg  25 mg Per Tube BID Donne Hazel, MD   25 mg at 08/05/18 0849  . midazolam (  VERSED) 2 MG/2ML injection           . ondansetron (ZOFRAN) injection 4 mg  4 mg Intravenous Q6H PRN Bodenheimer, Charles A, NP   4 mg at 08/03/18 2211  . RESOURCE THICKENUP CLEAR   Oral PRN Kayleen Memos, DO      . valproic acid (DEPAKENE) solution 500 mg  500 mg Per Tube QID Kerney Elbe, MD   500 mg at 08/05/18 1438  . vancomycin (VANCOCIN) 1-5 GM/200ML-% IVPB              Discharge Medications: Please see discharge summary for a list of discharge medications.  Relevant Imaging Results:  Relevant Lab Results:   Additional Information SS#: [id number]  Geralynn Ochs, LCSW

## 2018-08-05 NOTE — Progress Notes (Signed)
Patient FSBG 46. 50% dextrose given, will reassess pt in 8minute.

## 2018-08-05 NOTE — Progress Notes (Signed)
Inpatient Diabetes Program Recommendations  AACE/ADA: New Consensus Statement on Inpatient Glycemic Control (2015)  Target Ranges:  Prepandial:   less than 140 mg/dL      Peak postprandial:   less than 180 mg/dL (1-2 hours)      Critically ill patients:  140 - 180 mg/dL   Lab Results  Component Value Date   GLUCAP 105 (H) 08/05/2018   HGBA1C 6.3 (H) 07/13/2018    Results for [name], [name] (MRN [id number]) as of 08/05/2018 09:49  Ref. Range 08/04/2018 19:42 08/05/2018 00:31 08/05/2018 04:29 08/05/2018 05:12 08/05/2018 07:34  Glucose-Capillary Latest Ref Range: 70 - 99 mg/dL 109 (H) 168 (H) 49 (L) 235 (H) 105 (H)    DM2  Current meds:  Levemir 18 units hs                           Novolog moderate scale (0-15 units) Q4  Tube feeding currently stopped. Note CBG low middle of night.   MD please consider the following inpatient diabetes adjustments:  Change Novolog Q4 to SENSITIVE scale (0-9 units)  Thank you.  -- Will follow during hospitalization.--  Jonna Clark RN, MSN Diabetes Coordinator Inpatient Glycemic Control Team Team Pager: [phone] (8am-5pm)

## 2018-08-05 NOTE — Progress Notes (Signed)
SLP Cancellation Note  Patient Details Name: SALVADOR BIGBEE MRN: [id number] DOB: October 06, 1951   Cancelled treatment:       Reason Eval/Treat Not Completed: Medical issues which prohibited therapy(pt npo for peg, will continue efforts)   Macario Golds 08/05/2018, 7:18 AM  Luanna Salk, MS [facility] SLP Acute Rehab Services Pager [phone] Office [phone]

## 2018-08-05 NOTE — Procedures (Signed)
20 Fr pull through G tube EBL 0 Comp 0 

## 2018-08-05 NOTE — Progress Notes (Signed)
FSBG rechecked again as 235

## 2018-08-05 NOTE — Progress Notes (Signed)
Feeding tube stopped, patient is NPO.

## 2018-08-06 LAB — BASIC METABOLIC PANEL
ANION GAP: 13 (ref 5–15)
BUN: 14 mg/dL (ref 8–23)
CALCIUM: 9.2 mg/dL (ref 8.9–10.3)
CO2: 21 mmol/L — ABNORMAL LOW (ref 22–32)
Chloride: 102 mmol/L (ref 98–111)
Creatinine, Ser: 0.87 mg/dL (ref 0.44–1.00)
GLUCOSE: 196 mg/dL — AB (ref 70–99)
Potassium: 4.9 mmol/L (ref 3.5–5.1)
Sodium: 136 mmol/L (ref 135–145)

## 2018-08-06 LAB — GLUCOSE, CAPILLARY
GLUCOSE-CAPILLARY: 100 mg/dL — AB (ref 70–99)
GLUCOSE-CAPILLARY: 198 mg/dL — AB (ref 70–99)
GLUCOSE-CAPILLARY: 123 mg/dL — AB (ref 70–99)
Glucose-Capillary: 111 mg/dL — ABNORMAL HIGH (ref 70–99)
Glucose-Capillary: 141 mg/dL — ABNORMAL HIGH (ref 70–99)
Glucose-Capillary: 90 mg/dL (ref 70–99)

## 2018-08-06 LAB — PHOSPHORUS: Phosphorus: 3.5 mg/dL (ref 2.5–4.6)

## 2018-08-06 LAB — MAGNESIUM: Magnesium: 1.9 mg/dL (ref 1.7–2.4)

## 2018-08-06 MED ORDER — SODIUM CHLORIDE 0.9 % IV SOLN
INTRAVENOUS | Status: DC
  Administered 2018-08-06 – 2018-08-08 (×4): via INTRAVENOUS

## 2018-08-06 MED ORDER — MAGNESIUM SULFATE 2 GM/50ML IV SOLN
2.0000 g | Freq: Once | INTRAVENOUS | Status: AC
  Administered 2018-08-06: 2 g via INTRAVENOUS
  Filled 2018-08-06: qty 50

## 2018-08-06 MED ORDER — GUAIFENESIN-DM 100-10 MG/5ML PO SYRP
15.0000 mL | ORAL_SOLUTION | Freq: Two times a day (BID) | ORAL | Status: DC
  Administered 2018-08-06 – 2018-08-14 (×14): 15 mL
  Filled 2018-08-06 (×14): qty 15

## 2018-08-06 MED ORDER — SODIUM CHLORIDE 3 % IN NEBU
4.0000 mL | INHALATION_SOLUTION | Freq: Three times a day (TID) | RESPIRATORY_TRACT | Status: DC
  Administered 2018-08-06: 4 mL via RESPIRATORY_TRACT
  Filled 2018-08-06 (×3): qty 4

## 2018-08-06 MED ORDER — IPRATROPIUM-ALBUTEROL 0.5-2.5 (3) MG/3ML IN SOLN
3.0000 mL | Freq: Three times a day (TID) | RESPIRATORY_TRACT | Status: DC
  Administered 2018-08-06 – 2018-08-10 (×10): 3 mL via RESPIRATORY_TRACT
  Filled 2018-08-06 (×11): qty 3

## 2018-08-06 MED ORDER — IPRATROPIUM-ALBUTEROL 0.5-2.5 (3) MG/3ML IN SOLN
3.0000 mL | Freq: Four times a day (QID) | RESPIRATORY_TRACT | Status: DC
  Administered 2018-08-06: 3 mL via RESPIRATORY_TRACT
  Filled 2018-08-06: qty 3

## 2018-08-06 MED ORDER — POTASSIUM CHLORIDE 20 MEQ/15ML (10%) PO SOLN
40.0000 meq | Freq: Every day | ORAL | Status: DC
  Administered 2018-08-06: 40 meq
  Filled 2018-08-06: qty 30

## 2018-08-06 NOTE — Progress Notes (Signed)
G-tube feeding started, Jevity 1.5cal

## 2018-08-06 NOTE — Progress Notes (Signed)
CBC clotted twice today per Lab. RN spoke to [employer] from lab. Dr. Nevada Crane aware.   Ave Filter, RN

## 2018-08-06 NOTE — Progress Notes (Signed)
PROGRESS NOTE  [name] ZOX:[id number] DOB: [birth year] DOA: 07/13/2018 PCP: [name], MD  HPI/Recap of past 27 hours: 67 year old F w/ a history of prior CVAs with carotid artery disease, hypertension, hyperlipidemia, normal pressure hydrocephalus status post shunt who was admitted with seizures and a new left MCA territory stroke. Treated with heparin and antiplatelets. Had recurrent seizures and transferred to ICU 9/29 for Versed and seizure suppression. Intubated and ventilated 9/29.   08/03/2018: Patient seen and examined at her bedside.  She is alert and oriented x2.  Cord track in place and head of bed at an elevated angle.  Denies nausea.  08/04/2018: Patient seen and examined with daughter at bedside.  She is somnolent however easily arousable to voices.  Does not appear to be in any acute distress.  No acute events overnight.  She has no new complaint.  Interventional radiology consulted for possible PEG tube placement.  Currently on trickle feeds, will advance as tolerated.  08/05/18: seen with daughter at bedside. Somnolent but arousable to voices. No acute events overnight. Per daughter some confusion at night possibly from sundowning.  08/06/2018: Patient examined with daughter at bedside.  Somnolent but more responsive today and mumbles answers.  She denies any pain or nausea.  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 Neurology following Anticoagulation as recommended by neurology Switch back  to eliquis post peg tube placement today  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 no reported recurrent seizures Continue anti-seizures medications On Keppra and Depakote Seizure precautions  Dyselectrolytemia BMP along with magnesium and phosphorus pending Replete electrolytes as indicated  Severe dysphagia status post peg tube placement by IR on 08/05/18 Trickle feed, advance as tolerated C/w Free water flushes Nutritionist following Aspiration precautions Elevate head of bed during feeding at least 35 degree angle  AKI, improving No history of prior CKD Baseline creatinine 0.7 Renal function continues to improve Creatinine 0.97 from 1.17 with GFR of 60 from 47 and BUN of 30 Suspect prerenal from dehydration Continue free water flushes to 400 cc every 8 hours Continue monitor urine output  Suspected chemical pneumonitis from possible aspiration No sign of systemic involvement Monitor fever curve and WBC Deep suctioning by RT twice daily Start duo nebs every 6 hours Start hypersaline nebs twice daily and Mucinex  Hyperkalemia, improving Potassium 4.6 from 5.4  Chronic normocytic anemia Hemoglobin appears to be at baseline MCV 88 No sign of overt bleeding Repeat CBC in the morning  Ambulatory dysfunction PT recommended SNF Social worker consulted for SNF placement Fall precaution Continue PT OT      Code Status: DNR  Family Communication: Daughter at bedside  Disposition Plan: Home versus CIR versus SNF possibly on Monday, 08/08/2018   Consultants:  Neurology  IR  Procedures:  Peg tube placement 08/05/18  Antimicrobials:  None  DVT prophylaxis: eliquis    Objective: Vitals:   08/05/18 1950 08/06/18 0004 08/06/18 0332 08/06/18 0800  BP: (!) 142/62 130/79 (!) 144/77 138/84  Pulse: 100 96 (!) 103 (!) 106  Resp: 20 20 20 18   Temp: 97.9 F (36.6 C) 97.8 F (36.6 C) 98.6 F (37 C) 98.1 F (36.7 C)  TempSrc: Axillary  Axillary Axillary Oral  SpO2: 95% 100% 100%   Weight:   57.4 kg   Height:        Intake/Output Summary (Last 24 hours) at 08/06/2018 1141 Last data filed at 08/05/2018 1250 Gross per 24 hour  Intake 0 ml  Output -  Net 0 ml   Filed Weights   08/02/18 2100 08/04/18 0500 08/06/18 0332  Weight: 58 kg 58.8 kg 57.4 kg    Exam:  . General: 67 y.o. year-old female frail in no acute distress.  Somnolent but easily arousable to voices. . Cardiovascular: Regular rate and rhythm with no rubs or gallops.  No JVD or thyromegaly noted. Marland Kitchen Respiratory: Mild rales at bases with mild wheezes. . Abdomen: Soft nontender nondistended with normal bowel sounds x4 quadrants. . Musculoskeletal: No lower extremity edema. 2/4 pulses in all 4 extremities. Marland Kitchen Psychiatry: Mood is appropriate for condition and setting   Data Reviewed: CBC: Recent Labs  Lab 07/31/18 0536 08/01/18 0402 08/03/18 0543 08/04/18 0322 08/05/18 0353  WBC 11.9* 15.5* 11.1* 8.1 9.5  HGB 10.4* 10.8* 11.3* 10.8* 10.4*  HCT 33.5* 34.9* 37.0 35.9* 33.4*  MCV 88.4 88.1 88.3 89.3 88.8  PLT 465* 526* 528* 470* 034*   Basic Metabolic Panel: Recent Labs  Lab 08/03/18 0543 08/03/18 2225 08/04/18 0322 08/05/18 0705  NA 136  --  137 138  K 5.4* 4.3 4.6 3.4*  CL 95*  --  96* 98  CO2 26  --  24 28  GLUCOSE 112*  --  136* 121*  BUN 29*  --  30* 29*  CREATININE 1.01*  --  1.17* 0.97  CALCIUM 9.9  --  9.7 9.5   GFR: Estimated Creatinine Clearance: 45.1 mL/min (by C-G formula based on SCr of 0.97 mg/dL). Liver Function Tests: No results for input(s): AST, ALT, ALKPHOS, BILITOT, PROT, ALBUMIN in the last 168 hours. No results for input(s): LIPASE, AMYLASE in the last 168 hours. No results for input(s): AMMONIA in the last 168 hours. Coagulation Profile: Recent Labs  Lab 08/05/18 0353  INR 0.95   Cardiac Enzymes: No results for input(s): CKTOTAL, CKMB, CKMBINDEX, TROPONINI in the last 168 hours. BNP (last 3 results) No  results for input(s): PROBNP in the last 8760 hours. HbA1C: No results for input(s): HGBA1C in the last 72 hours. CBG: Recent Labs  Lab 08/05/18 1627 08/05/18 1951 08/06/18 0002 08/06/18 0336 08/06/18 0756  GLUCAP 151* 94 141* 90 100*   Lipid Profile: No results for input(s): CHOL, HDL, LDLCALC, TRIG, CHOLHDL, LDLDIRECT in the last 72 hours. Thyroid Function Tests: No results for input(s): TSH, T4TOTAL, FREET4, T3FREE, THYROIDAB in the last 72 hours. Anemia Panel: No results for input(s): VITAMINB12, FOLATE, FERRITIN, TIBC, IRON, RETICCTPCT in the last 72 hours. Urine analysis:    Component Value Date/Time   COLORURINE YELLOW 06/20/2017 0642   APPEARANCEUR HAZY (A) 06/20/2017 0642   LABSPEC 1.010 06/20/2017 0642   PHURINE 6.0 06/20/2017 0642   GLUCOSEU 50 (A) 06/20/2017 0642   HGBUR LARGE (A) 06/20/2017 0642   BILIRUBINUR NEGATIVE 06/20/2017 0642   KETONESUR NEGATIVE 06/20/2017 0642   PROTEINUR NEGATIVE 06/20/2017 0642   NITRITE POSITIVE (A) 06/20/2017 0642   LEUKOCYTESUR LARGE (A) 06/20/2017 0642   Sepsis Labs: @LABRCNTIP (procalcitonin:4,lacticidven:4)  )No results found for this or any previous visit (from the past 240 hour(s)).  Studies: Ir Gastrostomy Tube Mod Sed  Result Date: 08/05/2018 INDICATION: Stroke EXAM: PERC PLACEMENT GASTROSTOMY MEDICATIONS: Vancomycin 1 gm IV; Antibiotics were administered within 1 hour of the procedure. Glucagon 1 mg IV ANESTHESIA/SEDATION: Versed 1 mg IV; Fentanyl 50 mcg IV Moderate Sedation Time:  11 minutes The patient was continuously monitored during the procedure by the interventional radiology nurse under my direct supervision. CONTRAST:  None-administered into the gastric lumen. FLUOROSCOPY TIME:  Fluoroscopy Time: 2 minutes 42 seconds (6 mGy). COMPLICATIONS: None immediate. PROCEDURE: The procedure, risks, benefits, and alternatives were explained to the patient. Questions regarding the procedure were encouraged and answered.  The patient understands and consents to the procedure. The epigastrium was prepped with Betadine in a sterile fashion, and a sterile drape was applied covering the operative field. A sterile gown and sterile gloves were used for the procedure. A 5-French orogastric tube is placed under fluoroscopic guidance. Scout imaging of the abdomen confirms barium within the transverse colon. The stomach was distended with gas. Under fluoroscopic guidance, an 18 gauge needle was utilized to puncture the anterior wall of the body of the stomach. An Amplatz wire was advanced through the needle passing a T fastener into the lumen of the stomach. The T fastener was secured for gastropexy. A 9-French sheath was inserted. A snare was advanced through the 9-French sheath. A Britta Mccreedy was advanced through the orogastric tube. It was snared then pulled out the oral cavity, pulling the snare, as well. The leading edge of the gastrostomy was attached to the snare. It was then pulled down the esophagus and out the percutaneous site. It was secured in place. Contrast was injected. The image demonstrates placement of a 20-French pull-through type gastrostomy tube into the body of the stomach. IMPRESSION: Successful 20 French pull-through gastrostomy. Electronically Signed   By: Marybelle Killings M.D.   On: 08/05/2018 15:01    Scheduled Meds: . apixaban  5 mg Oral BID  . chlorhexidine  15 mL Mouth Rinse BID  . feeding supplement (JEVITY 1.2 CAL)  1,000 mL Per Tube Q24H  . feeding supplement (PRO-STAT SUGAR FREE 64)  30 mL Per Tube Daily  . free water  400 mL Per Tube Q8H  . gabapentin  300 mg Oral TID  . gemfibrozil  600 mg Per Tube BID AC  . insulin aspart  0-15 Units Subcutaneous Q4H  . insulin detemir  18 Units Subcutaneous QHS  . lacosamide  200 mg Per Tube BID  . levETIRAcetam  1,500 mg Per Tube BID  . mouth rinse  15 mL Mouth Rinse q12n4p  . metoprolol tartrate  25 mg Per Tube BID  . valproic acid  500 mg Per Tube QID     Continuous Infusions: . sodium chloride 10 mL/hr at 07/28/18 2209     LOS: 24 days     Kayleen Memos, MD [facility] Pager [phone]  If 7PM-7AM, please contact night-coverage [email or web address] Password [email or web address] 08/06/2018, 11:41 AM

## 2018-08-07 LAB — GLUCOSE, CAPILLARY
GLUCOSE-CAPILLARY: 129 mg/dL — AB (ref 70–99)
GLUCOSE-CAPILLARY: 137 mg/dL — AB (ref 70–99)
GLUCOSE-CAPILLARY: 153 mg/dL — AB (ref 70–99)
GLUCOSE-CAPILLARY: 99 mg/dL (ref 70–99)
Glucose-Capillary: 62 mg/dL — ABNORMAL LOW (ref 70–99)
Glucose-Capillary: 149 mg/dL — ABNORMAL HIGH (ref 70–99)
Glucose-Capillary: 120 mg/dL — ABNORMAL HIGH (ref 70–99)

## 2018-08-07 LAB — CBC
HCT: 28.1 % — ABNORMAL LOW (ref 36.0–46.0)
Hemoglobin: 8.6 g/dL — ABNORMAL LOW (ref 12.0–15.0)
MCH: 27.5 pg (ref 26.0–34.0)
MCHC: 30.6 g/dL (ref 30.0–36.0)
MCV: 89.8 fL (ref 80.0–100.0)
Platelets: 356 10*3/uL (ref 150–400)
RBC: 3.13 MIL/uL — ABNORMAL LOW (ref 3.87–5.11)
RDW: 16.2 % — AB (ref 11.5–15.5)
WBC: 11 10*3/uL — AB (ref 4.0–10.5)
nRBC: 0 % (ref 0.0–0.2)

## 2018-08-07 MED ORDER — DEXTROSE 50 % IV SOLN
INTRAVENOUS | Status: AC
  Administered 2018-08-07: 25 mL
  Filled 2018-08-07: qty 50

## 2018-08-07 MED ORDER — INSULIN DETEMIR 100 UNIT/ML ~~LOC~~ SOLN
10.0000 [IU] | Freq: Every day | SUBCUTANEOUS | Status: DC
  Administered 2018-08-07 – 2018-08-08 (×2): 10 [IU] via SUBCUTANEOUS
  Filled 2018-08-07 (×2): qty 0.1

## 2018-08-07 MED ORDER — SODIUM CHLORIDE 3 % IN NEBU
4.0000 mL | INHALATION_SOLUTION | Freq: Three times a day (TID) | RESPIRATORY_TRACT | Status: DC
  Administered 2018-08-07 – 2018-08-10 (×8): 4 mL via RESPIRATORY_TRACT
  Filled 2018-08-07 (×10): qty 4

## 2018-08-07 MED ORDER — GABAPENTIN 250 MG/5ML PO SOLN
300.0000 mg | Freq: Three times a day (TID) | ORAL | Status: DC
  Administered 2018-08-07 – 2018-08-08 (×4): 300 mg via ORAL
  Filled 2018-08-07 (×6): qty 6

## 2018-08-07 NOTE — Progress Notes (Signed)
Hypoglycemic Event  CBG: 62  Treatment: D50 IV 25 mL  Symptoms: None  Follow-up CBG: [id number] CBG Result:149  Possible Reasons for Event: Unknown  Comments/MD notified:n/a    [name]

## 2018-08-07 NOTE — Progress Notes (Signed)
PROGRESS NOTE  [name]:[id number] DOB: April 13, 1951 DOA: 07/13/2018 PCP: [name], MD  HPI/Recap of past 48 hours: 67 year old F w/ a history of prior CVAs with carotid artery disease, hypertension, hyperlipidemia, normal pressure hydrocephalus status post shunt who was admitted with seizures and a new left MCA territory stroke. Treated with heparin and antiplatelets. Had recurrent seizures and transferred to ICU 9/29 for Versed and seizure suppression. Intubated and ventilated 9/29.   08/03/2018: Patient seen and examined at her bedside.  She is alert and oriented x2.  Cord track in place and head of bed at an elevated angle.  Denies nausea.  08/04/2018: Patient seen and examined with daughter at bedside.  She is somnolent however easily arousable to voices.  Does not appear to be in any acute distress.  No acute events overnight.  She has no new complaint.  Interventional radiology consulted for possible PEG tube placement.  Currently on trickle feeds, will advance as tolerated.  08/05/18: seen with daughter at bedside. Somnolent but arousable to voices. No acute events overnight. Per daughter some confusion at night possibly from sundowning.  08/06/2018: Patient examined with daughter at bedside.  Somnolent but more responsive today and mumbles answers.  She denies any pain or nausea.  08/07/2018: Patient seen and examined with her daughter at bedside.  Hypoglycemic early this morning with blood sugar in the 60s.  Decreased Lantus to 10 units daily and will continue to monitor blood sugar.  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 Neurology following Anticoagulation as recommended by neurology Continue Eliquis post peg tube placement today  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 no reported recurrent seizures Continue anti-seizures medications On Keppra, Vimpat, Depakote, and gabapentin Will ask neurology to reassess antiepileptic drugs due to persistent somnolence Seizure precautions  Type 2 diabetes complicated by hypoglycemia Blood sugar early this morning 62 Decrease insulin from 18 units to 10 unit daily Continue insulin sliding scale Continue to closely monitor blood sugar  Last A1c 6.3 from 07/13/2018  Persistent somnolence Suspect secondary to side effect from antiepileptic drugs We will ask neurology to reevaluate her medications  Chronic normocytic anemia Hemoglobin dropped this morning from 10.4-8.6 No overt sign of bleeding Could be dilutional Repeat CBC in the morning  Dyselectrolytemia BMP along with magnesium and phosphorus pending Replete electrolytes as indicated  Severe dysphagia status post peg tube placement by IR on 08/05/18 Trickle feed, advance as tolerated C/w Free water flushes Nutritionist following Aspiration precautions Elevate head of bed during feeding at least 35 degree angle  AKI, improving No history of prior CKD Baseline creatinine 0.7 Renal function continues to improve Creatinine 0.97 from 1.17 with GFR of 60 from 47 and BUN of 30 Suspect prerenal from dehydration Continue free water flushes to 400 cc every 8 hours Continue monitor urine output  Suspected chemical pneumonitis from possible aspiration No sign of systemic involvement Monitor fever curve and WBC Deep suctioning by RT twice daily Start duo nebs every 6 hours Start hypersaline nebs twice daily and  Mucinex  Hyperkalemia, improving Potassium 4.6  from 5.4  Ambulatory dysfunction PT recommended SNF Social worker consulted for SNF placement Fall precaution Continue PT OT      Code Status: DNR  Family Communication: Daughter at bedside  Disposition Plan: SNF possibly tomorrow Monday, 08/08/2018  Consultants:  Neurology  IR  Procedures:  Peg tube placement 08/05/18  Antimicrobials:  None  DVT prophylaxis: eliquis    Objective: Vitals:   08/07/18 0410 08/07/18 0500 08/07/18 0735 08/07/18 0800  BP: (!) 123/59   (!) 106/48  Pulse:    100  Resp: 18   18  Temp: 98.3 F (36.8 C)   98 F (36.7 C)  TempSrc: Axillary   Oral  SpO2: 97%  98% 99%  Weight:  59.3 kg    Height:        Intake/Output Summary (Last 24 hours) at 08/07/2018 1133 Last data filed at 08/07/2018 0800 Gross per 24 hour  Intake 2017.22 ml  Output -  Net 2017.22 ml   Filed Weights   08/04/18 0500 08/06/18 0332 08/07/18 0500  Weight: 58.8 kg 57.4 kg 59.3 kg    Exam:  . General: 67 y.o. year-old female frail in no acute distress.  Somnolent but easily arousable to voices.   . Cardiovascular: Regular rate and rhythm with no rubs or gallops.  No JVD or thyromegaly noted. Marland Kitchen Respiratory: Mild rales at bases with no wheezes.  Poor inspiratory effort. . Abdomen: Soft nontender nondistended with normal bowel sounds x4 quadrants. . Musculoskeletal: No lower extremity edema. 2/4 pulses in all 4 extremities. Marland Kitchen Psychiatry: Mood is appropriate for condition and setting   Data Reviewed: CBC: Recent Labs  Lab 08/01/18 0402 08/03/18 0543 08/04/18 0322 08/05/18 0353 08/07/18 0447  WBC 15.5* 11.1* 8.1 9.5 11.0*  HGB 10.8* 11.3* 10.8* 10.4* 8.6*  HCT 34.9* 37.0 35.9* 33.4* 28.1*  MCV 88.1 88.3 89.3 88.8 89.8  PLT 526* 528* 470* 579* 509   Basic Metabolic Panel: Recent Labs  Lab 08/03/18 0543 08/03/18 2225 08/04/18 0322 08/05/18 0705 08/06/18 1120  NA 136  --  137 138 136  K 5.4* 4.3 4.6 3.4* 4.9  CL 95*  --  96* 98 102  CO2 26   --  24 28 21*  GLUCOSE 112*  --  136* 121* 196*  BUN 29*  --  30* 29* 14  CREATININE 1.01*  --  1.17* 0.97 0.87  CALCIUM 9.9  --  9.7 9.5 9.2  MG  --   --   --   --  1.9  PHOS  --   --   --   --  3.5   GFR: Estimated Creatinine Clearance: 50.3 mL/min (by C-G formula based on SCr of 0.87 mg/dL). Liver Function Tests: No results for input(s): AST, ALT, ALKPHOS, BILITOT, PROT, ALBUMIN in the last 168 hours. No results for input(s): LIPASE, AMYLASE in the last 168 hours. No results for input(s): AMMONIA in the last 168 hours. Coagulation Profile: Recent Labs  Lab 08/05/18 0353  INR 0.95   Cardiac Enzymes: No results for input(s): CKTOTAL, CKMB, CKMBINDEX, TROPONINI in the last 168 hours. BNP (last 3 results) No results for input(s): PROBNP in the last 8760 hours. HbA1C: No results for input(s): HGBA1C in the last 72 hours. CBG: Recent Labs  Lab 08/06/18 2008 08/07/18 0013 08/07/18 0427 08/07/18 0454 08/07/18 0815  GLUCAP 111* 153* 62* 149* 99   Lipid Profile: No results for input(s): CHOL, HDL, LDLCALC, TRIG, CHOLHDL, LDLDIRECT in  the last 72 hours. Thyroid Function Tests: No results for input(s): TSH, T4TOTAL, FREET4, T3FREE, THYROIDAB in the last 72 hours. Anemia Panel: No results for input(s): VITAMINB12, FOLATE, FERRITIN, TIBC, IRON, RETICCTPCT in the last 72 hours. Urine analysis:    Component Value Date/Time   COLORURINE YELLOW 06/20/2017 0642   APPEARANCEUR HAZY (A) 06/20/2017 0642   LABSPEC 1.010 06/20/2017 0642   PHURINE 6.0 06/20/2017 0642   GLUCOSEU 50 (A) 06/20/2017 0642   HGBUR LARGE (A) 06/20/2017 0642   BILIRUBINUR NEGATIVE 06/20/2017 0642   KETONESUR NEGATIVE 06/20/2017 0642   PROTEINUR NEGATIVE 06/20/2017 0642   NITRITE POSITIVE (A) 06/20/2017 0642   LEUKOCYTESUR LARGE (A) 06/20/2017 0642   Sepsis Labs: @LABRCNTIP (procalcitonin:4,lacticidven:4)  )No results found for this or any previous visit (from the past 240 hour(s)).    Studies: No  results found.  Scheduled Meds: . apixaban  5 mg Oral BID  . chlorhexidine  15 mL Mouth Rinse BID  . feeding supplement (JEVITY 1.2 CAL)  1,000 mL Per Tube Q24H  . feeding supplement (PRO-STAT SUGAR FREE 64)  30 mL Per Tube Daily  . free water  400 mL Per Tube Q8H  . gabapentin  300 mg Oral TID  . gemfibrozil  600 mg Per Tube BID AC  . guaiFENesin-dextromethorphan  15 mL Per Tube BID  . insulin aspart  0-15 Units Subcutaneous Q4H  . insulin detemir  10 Units Subcutaneous QHS  . ipratropium-albuterol  3 mL Nebulization TID  . lacosamide  200 mg Per Tube BID  . levETIRAcetam  1,500 mg Per Tube BID  . mouth rinse  15 mL Mouth Rinse q12n4p  . metoprolol tartrate  25 mg Per Tube BID  . sodium chloride HYPERTONIC  4 mL Nebulization TID  . valproic acid  500 mg Per Tube QID    Continuous Infusions: . sodium chloride 10 mL/hr at 07/28/18 2209  . sodium chloride 75 mL/hr at 08/07/18 0448     LOS: 25 days     Kayleen Memos, MD [facility] Pager [phone]  If 7PM-7AM, please contact night-coverage [email or web address] Password [email or web address] 08/07/2018, 11:33 AM

## 2018-08-08 ENCOUNTER — Inpatient Hospital Stay (HOSPITAL_COMMUNITY): Payer: Medicare Other

## 2018-08-08 LAB — LACTIC ACID, PLASMA: LACTIC ACID, VENOUS: 1.5 mmol/L (ref 0.5–1.9)

## 2018-08-08 LAB — GLUCOSE, CAPILLARY
GLUCOSE-CAPILLARY: 167 mg/dL — AB (ref 70–99)
Glucose-Capillary: 117 mg/dL — ABNORMAL HIGH (ref 70–99)
Glucose-Capillary: 117 mg/dL — ABNORMAL HIGH (ref 70–99)
Glucose-Capillary: 121 mg/dL — ABNORMAL HIGH (ref 70–99)
Glucose-Capillary: 156 mg/dL — ABNORMAL HIGH (ref 70–99)
Glucose-Capillary: 178 mg/dL — ABNORMAL HIGH (ref 70–99)

## 2018-08-08 LAB — BASIC METABOLIC PANEL
ANION GAP: 7 (ref 5–15)
BUN: 12 mg/dL (ref 8–23)
CALCIUM: 8.5 mg/dL — AB (ref 8.9–10.3)
CHLORIDE: 106 mmol/L (ref 98–111)
CO2: 25 mmol/L (ref 22–32)
CREATININE: 0.74 mg/dL (ref 0.44–1.00)
GFR calc Af Amer: >60 mL/min (ref >60)
GFR calc non Af Amer: >60 mL/min (ref >60)
GLUCOSE: 138 mg/dL — AB (ref 70–99)
Potassium: 4.4 mmol/L (ref 3.5–5.1)
SODIUM: 138 mmol/L (ref 135–145)

## 2018-08-08 LAB — CBC
HEMATOCRIT: 29.6 % — AB (ref 36.0–46.0)
HEMOGLOBIN: 9.2 g/dL — AB (ref 12.0–15.0)
MCH: 28.1 pg (ref 26.0–34.0)
MCHC: 31.1 g/dL (ref 30.0–36.0)
MCV: 90.5 fL (ref 80.0–100.0)
NRBC: 0 % (ref 0.0–0.2)
Platelets: 322 10*3/uL (ref 150–400)
RBC: 3.27 MIL/uL — ABNORMAL LOW (ref 3.87–5.11)
RDW: 16.1 % — ABNORMAL HIGH (ref 11.5–15.5)
WBC: 12.2 10*3/uL — AB (ref 4.0–10.5)

## 2018-08-08 LAB — PROCALCITONIN: Procalcitonin: 0.12 ng/mL

## 2018-08-08 LAB — AMMONIA: AMMONIA: 54 umol/L — AB (ref 9–35)

## 2018-08-08 LAB — VALPROIC ACID LEVEL: VALPROIC ACID LVL: 69 ug/mL (ref 50.0–100.0)

## 2018-08-08 MED ORDER — JEVITY 1.5 CAL/FIBER PO LIQD
285.0000 mL | Freq: Four times a day (QID) | ORAL | Status: DC
  Administered 2018-08-08: 285 mL
  Administered 2018-08-08: 50 mL
  Administered 2018-08-09: 175 mL
  Administered 2018-08-09: 200 mL
  Filled 2018-08-08 (×14): qty 1000

## 2018-08-08 MED ORDER — FREE WATER
400.0000 mL | Freq: Three times a day (TID) | Status: DC
  Administered 2018-08-09 (×2): 400 mL

## 2018-08-08 MED ORDER — JEVITY 1.5 CAL/FIBER PO LIQD
280.0000 mL | Freq: Four times a day (QID) | ORAL | Status: DC
  Filled 2018-08-08: qty 1000

## 2018-08-08 NOTE — Progress Notes (Signed)
PROGRESS NOTE  [name] WCH:[id number] DOB: 02-26-1951 DOA: 07/13/2018 PCP: [name], MD  HPI/Recap of past 57 hours: 67 year old F w/ a history of prior CVAs with carotid artery disease, hypertension, hyperlipidemia, normal pressure hydrocephalus status post shunt who was admitted with seizures and a new left MCA territory stroke. Treated with heparin and antiplatelets. Had recurrent seizures and transferred to ICU 9/29 for Versed and seizure suppression. Intubated and ventilated 9/29.   08/03/2018: Patient seen and examined at her bedside.  She is alert and oriented x2.  Cord track in place and head of bed at an elevated angle.  Denies nausea.  08/04/2018: Patient seen and examined with daughter at bedside.  She is somnolent however easily arousable to voices.  Does not appear to be in any acute distress.  No acute events overnight.  She has no new complaint.  Interventional radiology consulted for possible PEG tube placement.  Currently on trickle feeds, will advance as tolerated.  08/05/18: seen with daughter at bedside. Somnolent but arousable to voices. No acute events overnight. Per daughter some confusion at night possibly from sundowning.  08/06/2018: Patient examined with daughter at bedside.  Somnolent but more responsive today and mumbles answers.  She denies any pain or nausea.  08/07/2018: Patient seen and examined with her daughter at bedside.  Hypoglycemic early this morning with blood sugar in the 60s.  Decreased Lantus to 10 units daily and will continue to monitor blood sugar.  08/08/18: seen and examined at bedside. Somnolent but arousable to voices. No recurrent seizures. Neurology re-consulted to reassess anti seizures medications.   Assessment/Plan: Active Problems:   Acute CVA (cerebrovascular accident) ([name])   Type II diabetes mellitus with complication ([name])   Benign essential HTN   Hypokalemia   Encounter for intubation   CVA (cerebral vascular  accident) ([name])   Diabetes mellitus ([name])   Status epilepticus ([name])   SOB (shortness of breath)   Advanced care planning/counseling discussion   Goals of care, counseling/discussion   Aspiration into airway  Left hemispheric acute CVA secondary to left ICA stenosis and left ICA thrombus Neurology following Anticoagulation as recommended by neurology Continue Eliquis post peg tube placement today  Acute metabolic encephalopathy unclear etiology possibly 2/2 to SE from anti seizure meds vs hyperammonemia Per daughter at her baseline she is more alert Neurology re-consulted to reassess anti seizures medications Reorient as indicated Dc gabapentin as recommended by neuro  Hyperammonemia possibly from valproic acid Valproic Level checked by neurology Ammonia 54 from 34 Continue to monitor mental status  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 no reported recurrent seizures Continue anti-seizures medications On Keppra, Vimpat, Depakote, dced gabapentin neurology to reassess antiepileptic drugs due to persistent somnolence Seizure precautions  Type 2 diabetes complicated by hypoglycemia Continue lantus 10 unit daily Continue insulin sliding scale Continue to closely monitor blood sugar  Last A1c 6.3 from 07/13/2018  Persistent somnolence Suspect secondary to side effect from antiepileptic drugs We will ask neurology to reevaluate her medications  Chronic normocytic anemia Hemoglobin dropped this morning from 10.4-8.6 No overt sign of bleeding Could be dilutional Repeat CBC in the morning  Dyselectrolytemia BMP along with magnesium and phosphorus pending Replete electrolytes as indicated  Severe dysphagia status post peg tube placement by IR on 08/05/18 Trickle feed, advance as tolerated C/w Free water flushes Nutritionist following Aspiration precautions Elevate head of bed during feeding at least 35 degree angle  AKI, improving No history of  prior CKD Baseline creatinine  0.7 Renal function continues to improve Creatinine 0.97 from 1.17 with GFR of 60 from 47 and BUN of 30 Suspect prerenal from dehydration Continue free water flushes to 400 cc every 8 hours Continue monitor urine output  Suspected chemical pneumonitis from possible aspiration No sign of systemic involvement Monitor fever curve and WBC Deep suctioning by RT twice daily Start duo nebs every 6 hours Start hypersaline nebs twice daily and Mucinex  Hyperkalemia, improving Potassium 4.6 from 5.4  Ambulatory dysfunction PT recommended SNF Social worker consulted for SNF placement Fall precaution Continue PT OT      Code Status: DNR  Family Communication: Daughter at bedside  Disposition Plan: SNF possibly tomorrow tues, 08/09/2018  Consultants:  Neurology  IR  Procedures:  Peg tube placement 08/05/18  Antimicrobials:  None  DVT prophylaxis: eliquis    Objective: Vitals:   08/08/18 0739 08/08/18 0745 08/08/18 1020 08/08/18 1238  BP: 110/76   (!) 143/87  Pulse: 87   82  Resp: 18     Temp: 98.6 F (37 C)   98.4 F (36.9 C)  TempSrc: Axillary   Axillary  SpO2: 99% 100%  100%  Weight:   60.7 kg   Height:        Intake/Output Summary (Last 24 hours) at 08/08/2018 1527 Last data filed at 08/08/2018 3299 Gross per 24 hour  Intake 1108.87 ml  Output -  Net 1108.87 ml   Filed Weights   08/06/18 0332 08/07/18 0500 08/08/18 1020  Weight: 57.4 kg 59.3 kg 60.7 kg    Exam:  . General: 67 y.o. year-old female frail NAD somnolent arouses to voices.   . Cardiovascular: RRR no rubs or gallops no JVD no thyromegaly . Respiratory: Mild rales at bases with no wheezes.  Poor inspiratory effort. . Abdomen: Soft nontender nondistended with normal bowel sounds x4 quadrants. . Musculoskeletal: No lower extremity edema. 2/4 pulses in all 4 extremities. Marland Kitchen Psychiatry: Mood is appropriate for condition and setting   Data  Reviewed: CBC: Recent Labs  Lab 08/03/18 0543 08/04/18 0322 08/05/18 0353 08/07/18 0447 08/08/18 0404  WBC 11.1* 8.1 9.5 11.0* 12.2*  HGB 11.3* 10.8* 10.4* 8.6* 9.2*  HCT 37.0 35.9* 33.4* 28.1* 29.6*  MCV 88.3 89.3 88.8 89.8 90.5  PLT 528* 470* 579* 356 242   Basic Metabolic Panel: Recent Labs  Lab 08/03/18 0543 08/03/18 2225 08/04/18 0322 08/05/18 0705 08/06/18 1120 08/08/18 0404  NA 136  --  137 138 136 138  K 5.4* 4.3 4.6 3.4* 4.9 4.4  CL 95*  --  96* 98 102 106  CO2 26  --  24 28 21* 25  GLUCOSE 112*  --  136* 121* 196* 138*  BUN 29*  --  30* 29* 14 12  CREATININE 1.01*  --  1.17* 0.97 0.87 0.74  CALCIUM 9.9  --  9.7 9.5 9.2 8.5*  MG  --   --   --   --  1.9  --   PHOS  --   --   --   --  3.5  --    GFR: Estimated Creatinine Clearance: 59.3 mL/min (by C-G formula based on SCr of 0.74 mg/dL). Liver Function Tests: No results for input(s): AST, ALT, ALKPHOS, BILITOT, PROT, ALBUMIN in the last 168 hours. No results for input(s): LIPASE, AMYLASE in the last 168 hours. Recent Labs  Lab 08/08/18 1226  AMMONIA 54*   Coagulation Profile: Recent Labs  Lab 08/05/18 0353  INR 0.95   Cardiac  Enzymes: No results for input(s): CKTOTAL, CKMB, CKMBINDEX, TROPONINI in the last 168 hours. BNP (last 3 results) No results for input(s): PROBNP in the last 8760 hours. HbA1C: No results for input(s): HGBA1C in the last 72 hours. CBG: Recent Labs  Lab 08/07/18 2021 08/08/18 0007 08/08/18 0408 08/08/18 0748 08/08/18 1131  GLUCAP 137* 156* 117* 121* 178*   Lipid Profile: No results for input(s): CHOL, HDL, LDLCALC, TRIG, CHOLHDL, LDLDIRECT in the last 72 hours. Thyroid Function Tests: No results for input(s): TSH, T4TOTAL, FREET4, T3FREE, THYROIDAB in the last 72 hours. Anemia Panel: No results for input(s): VITAMINB12, FOLATE, FERRITIN, TIBC, IRON, RETICCTPCT in the last 72 hours. Urine analysis:    Component Value Date/Time   COLORURINE YELLOW 06/20/2017 0642    APPEARANCEUR HAZY (A) 06/20/2017 0642   LABSPEC 1.010 06/20/2017 0642   PHURINE 6.0 06/20/2017 0642   GLUCOSEU 50 (A) 06/20/2017 0642   HGBUR LARGE (A) 06/20/2017 0642   BILIRUBINUR NEGATIVE 06/20/2017 0642   KETONESUR NEGATIVE 06/20/2017 0642   PROTEINUR NEGATIVE 06/20/2017 0642   NITRITE POSITIVE (A) 06/20/2017 0642   LEUKOCYTESUR LARGE (A) 06/20/2017 0642   Sepsis Labs: @LABRCNTIP (procalcitonin:4,lacticidven:4)  )No results found for this or any previous visit (from the past 240 hour(s)).    Studies: No results found.  Scheduled Meds: . apixaban  5 mg Oral BID  . chlorhexidine  15 mL Mouth Rinse BID  . feeding supplement (JEVITY 1.5 CAL/FIBER)  285 mL Per Tube QID  . feeding supplement (PRO-STAT SUGAR FREE 64)  30 mL Per Tube Daily  . free water  400 mL Per Tube Q8H  . gemfibrozil  600 mg Per Tube BID AC  . guaiFENesin-dextromethorphan  15 mL Per Tube BID  . insulin aspart  0-15 Units Subcutaneous Q4H  . insulin detemir  10 Units Subcutaneous QHS  . ipratropium-albuterol  3 mL Nebulization TID  . lacosamide  200 mg Per Tube BID  . levETIRAcetam  1,500 mg Per Tube BID  . mouth rinse  15 mL Mouth Rinse q12n4p  . metoprolol tartrate  25 mg Per Tube BID  . sodium chloride HYPERTONIC  4 mL Nebulization TID  . valproic acid  500 mg Per Tube QID    Continuous Infusions: . sodium chloride 10 mL/hr at 07/28/18 2209  . sodium chloride 75 mL/hr at 08/08/18 1222     LOS: 26 days     Kayleen Memos, MD [facility] Pager [phone]  If 7PM-7AM, please contact night-coverage [email or web address] Password [email or web address] 08/08/2018, 3:26 PM

## 2018-08-08 NOTE — Procedures (Signed)
ELECTROENCEPHALOGRAM REPORT   Patient: [name]       Room #: 5T97F EEG No. ID: [id number] Age: 67 y.o.        Sex: female Referring Physician: [place] Crane Report Date:  08/08/2018        Interpreting Physician: Alexis Goodell  History: MAYDELL KNOEBEL is an 67 y.o. female with recent SE, now lethargic  Medications:  Eliquis, Depakote, Neurontin, Lopid, Insulin, Vimpat, Keppra, Lopressor  Conditions of Recording:  This is a 21 channel routine scalp EEG performed with bipolar and monopolar montages arranged in accordance to the international 10/20 system of electrode placement. One channel was dedicated to EKG recording.  The patient is in the lethargic state.  Description:  The background activity is slow and poorly organized.  It consists of a low voltage polymorphic delta activity that is diffusely distributed and continuous.  The patient is stimulated during the recording with no change in the background rhythm noted.  No epileptiform activity is noted.       Hyperventilation and intermittent photic stimulation were not performed.   IMPRESSION: This is an abnormal EEG secondary to general background slowing.  This finding may be seen with a diffuse disturbance that is etiologically nonspecific, but may include a metabolic encephalopathy, among other possibilities.  Clinical correlation recommended.  No epileptiform activity is noted.     Alexis Goodell, MD Neurology [phone] 08/08/2018, 2:53 PM

## 2018-08-08 NOTE — Progress Notes (Signed)
Physical Therapy Treatment Patient Details Name: [name] MRN: [id number] DOB: 05/10/1951 Today's Date: 08/08/2018    History of Present Illness 67 year old woman, history of prior CVAs with carotid artery disease, hypertension, hyperlipidemia, normal pressure hydrocephalus status post shunt.  Admitted with seizures and a new left MCA territory stroke. . Had recurrent seizures and transferred to ICU 9/29 for Versed and seizure suppression.  Intubated and ventilated 9/29. Extubated 10/6.    PT Comments    Pt remains lethargic and only communicating with grunting. Pt kept eyes closed through out session.  Sat edge of bed x 15 min and attempted ADLs and UE stretching in seated position.  Pt unable to keep her head upright and required assistance to maintain cervical extension during sitting.  Plan for SNF remains appropriate.  Pt continues to decline functional based on level of alertness.  May decrease frequency after next visit if she remains to decline.    Follow Up Recommendations  SNF;Supervision/Assistance - 24 hour     Equipment Recommendations  None recommended by PT    Recommendations for Other Services Rehab consult;OT consult     Precautions / Restrictions Precautions Precautions: Fall Restrictions Weight Bearing Restrictions: No    Mobility  Bed Mobility Overal bed mobility: Needs Assistance Bed Mobility: Rolling;Sidelying to Sit;Sit to Sidelying Rolling: Max assist Sidelying to sit: Max assist;+2 for physical assistance   Sit to supine: Max assist;Total assist;+2 for physical assistance   General bed mobility comments: increased assist due to pt lethargy, requires assist for LEs over EOB and to support trunk upright; assist to guide trunk and LEs when returning to supine in bed  Transfers                 General transfer comment: not attempted today due to pt lethargy  Ambulation/Gait                 Stairs              Wheelchair Mobility    Modified Rankin (Stroke Patients Only)       Balance Overall balance assessment: Needs assistance Sitting-balance support: Feet unsupported Sitting balance-Leahy Scale: Poor Sitting balance - Comments: requires mod-maxA for static sitting balance; requires physical assist to maintain cervical extension                                     Cognition Arousal/Alertness: Lethargic Behavior During Therapy: Flat affect Overall Cognitive Status: Difficult to assess                                 General Comments: pt continues to demonstrate limited interaction with therapies; maintained eyes closed throughout this session(limited interaction to assess.  )      Exercises General Exercises - Upper Extremity Shoulder Flexion: PROM;5 reps;Seated;Both(within available range) Shoulder Extension: PROM;5 reps;Both;Seated(within available range) Shoulder ABduction: PROM;5 reps;Seated;Limitations Shoulder Abduction Limitations: within available range Shoulder ADduction: PROM;5 reps;Both;Seated Elbow Flexion: PROM;5 reps;Seated Elbow Extension: PROM;5 reps;Both;Seated    General Comments        Pertinent Vitals/Pain Pain Assessment: Faces Faces Pain Scale: Hurts even more Pain Location: generalized, though mostly while performing PROM to UEs  Pain Descriptors / Indicators: Grimacing;Moaning Pain Intervention(s): Monitored during session;Repositioned;Limited activity within patient's tolerance    Home Living  Prior Function            PT Goals (current goals can now be found in the care plan section) Acute Rehab PT Goals Patient Stated Goal: none stated  PT Goal Formulation: Patient unable to participate in goal setting Potential to Achieve Goals: Fair Progress towards PT goals: Not progressing toward goals - comment(appears to be overmedicated)    Frequency    Min 3X/week      PT  Plan Current plan remains appropriate    Co-evaluation PT/OT/SLP Co-Evaluation/Treatment: Yes Reason for Co-Treatment: Complexity of the patient's impairments (multi-system involvement);For patient/therapist safety PT goals addressed during session: Mobility/safety with mobility OT goals addressed during session: ADL's and self-care      AM-PAC PT "6 Clicks" Daily Activity  Outcome Measure  Difficulty turning over in bed (including adjusting bedclothes, sheets and blankets)?: Unable Difficulty moving from lying on back to sitting on the side of the bed? : Unable Difficulty sitting down on and standing up from a chair with arms (e.g., wheelchair, bedside commode, etc,.)?: Unable Help needed moving to and from a bed to chair (including a wheelchair)?: Total Help needed walking in hospital room?: Total Help needed climbing 3-5 steps with a railing? : Total 6 Click Score: 6    End of Session Equipment Utilized During Treatment: Gait belt Activity Tolerance: Patient limited by lethargy;Patient limited by fatigue Patient left: in bed;with call bell/phone within reach;with bed alarm set;with family/visitor present Nurse Communication: Mobility status PT Visit Diagnosis: Other abnormalities of gait and mobility (R26.89);History of falling (Z91.81);Other symptoms and signs involving the nervous system (R29.898);Muscle weakness (generalized) (M62.81)     Time: 1660-6301 PT Time Calculation (min) (ACUTE ONLY): 29 min  Charges:  $Therapeutic Activity: 8-22 mins                     Governor Rooks, PTA Acute Rehabilitation Services Pager [phone] Office [phone]     Migel Hannis [name] 08/08/2018, 5:36 PM

## 2018-08-08 NOTE — Progress Notes (Signed)
Routine EEG completed, results pending. 

## 2018-08-08 NOTE — Progress Notes (Addendum)
Subjective: Patient is extremely sleepy.  History was obtained from the daughter at bedside.  Was recently seen by neurology for continued seizures.  She is currently on Keppra, Vimpat, Depakote and Neurontin.  Per daughter she has not had any further seizures for 8 days however since treatment she has been unable to wake up and required a PEG tube for feeding.  Neurology was called back to evaluate for extreme drowsiness and if this could be secondary to medications.  Exam: Vitals:   08/08/18 0739 08/08/18 0745  BP: 110/76   Pulse: 87   Resp: 18   Temp: 98.6 F (37 C)   SpO2: 99% 100%    Physical Exam   HEENT-  Normocephalic, no lesions, without obvious abnormality.  Normal external eye and conjunctiva.   Extremities- Warm, dry and intact Musculoskeletal-no joint tenderness, deformity or swelling Skin-warm and dry, no hyperpigmentation, vitiligo, or suspicious lesions    Neuro:  Mental Status: Extremely somnolent.  Does wake up to voice but follows no commands and does not reply. Cranial Nerves: II: No blink to threat III,IV, VI: Eyes remain shot however when eyelids are opened she does not have a disconjugate gaze or forced deviation of eyes.  Intact bilaterally pupils equal, round, reactive to light and accommodation V,VII: Face symmetric, facial light touch sensation normal bilaterally VIII: hearing normal bilaterally  Motor: Left arm does show a tremor with increased tone.  Right arm has increased tone.  Withdraws from pain bilaterally in the legs, equally. Plantars: downgoing bilaterally  Gait: unable to stand up     Medications:  Scheduled: . apixaban  5 mg Oral BID  . chlorhexidine  15 mL Mouth Rinse BID  . feeding supplement (JEVITY 1.2 CAL)  1,000 mL Per Tube Q24H  . feeding supplement (PRO-STAT SUGAR FREE 64)  30 mL Per Tube Daily  . free water  400 mL Per Tube Q8H  . gabapentin  300 mg Oral Q8H  . gemfibrozil  600 mg Per Tube BID AC  .  guaiFENesin-dextromethorphan  15 mL Per Tube BID  . insulin aspart  0-15 Units Subcutaneous Q4H  . insulin detemir  10 Units Subcutaneous QHS  . ipratropium-albuterol  3 mL Nebulization TID  . lacosamide  200 mg Per Tube BID  . levETIRAcetam  1,500 mg Per Tube BID  . mouth rinse  15 mL Mouth Rinse q12n4p  . metoprolol tartrate  25 mg Per Tube BID  . sodium chloride HYPERTONIC  4 mL Nebulization TID  . valproic acid  500 mg Per Tube QID    Pertinent Labs/Diagnostics:  EEG on 08/01/2018 did show occasional right temporal spikes with negative feel that T4.  No definite electrographic seizures were seen  No results found.   Etta Quill PA-C [facility] [phone]    NEUROHOSPITALIST ADDENDUM Performed a face to face diagnostic evaluation.   I have reviewed the contents of history and physical exam as documented by PA/ARNP/Resident and agree with above documentation.  I have discussed and formulated the above plan as documented. Edits to the note have been made as needed.       Assessment: 67 year old female with embolic watershed strokes who developed partial status epilepticus while in the hospital.  She had been on Keppra, Vimpat, Neurontin and Depakote.  Neurology was reconsulted as patient has remained extremely lethargic over the last week.   Per daughter no further seizure activity was noted.  Last EEG showed no electrographic seizures.    Recommendations: -Check valproic  acid level - repeat EEG - Consider stopping Neurontin   -We will continue to follow    08/08/2018, 12:07 PM  Karena Addison Aroor MD [facility] [phone]   If 7pm to 7am, please call on call as listed on AMION.

## 2018-08-08 NOTE — Progress Notes (Signed)
Zero residula before am meds

## 2018-08-08 NOTE — Progress Notes (Signed)
SLP Cancellation Note  Patient Details Name: [name] MRN: [id number] DOB: 01-09-1951   Cancelled treatment:       Reason Eval/Treat Not Completed: Patient at procedure or test/unavailable. Pt leaving the unit at this time for EEG. Will continue efforts.   Jayli Fogleman B. [facility], CCC-SLP Speech Language Pathologist [phone]  Shonna Chock 08/08/2018, 1:23 PM

## 2018-08-08 NOTE — Progress Notes (Signed)
Occupational Therapy Treatment Patient Details Name: [name] MRN: [id number] DOB: April 30, 1951 Today's Date: 08/08/2018    History of present illness 67 year old woman, history of prior CVAs with carotid artery disease, hypertension, hyperlipidemia, normal pressure hydrocephalus status post shunt.  Admitted with seizures and a new left MCA territory stroke. . Had recurrent seizures and transferred to ICU 9/29 for Versed and seizure suppression.  Intubated and ventilated 9/29. Extubated 10/6.   OT comments  Pt with increased lethargy this session and minimally engaging with therapists. Pt sat EOB >10 min during session with assist; requiring max-totalA for hand over hand to perform simple grooming ADLs while seated. Performed gentle stretching/PROM to UEs as pt with increased muscle tightness noted. Feel POC remains appropriate at this time. Will continue to follow acutely to progress pt towards established OT goals.   Follow Up Recommendations  SNF;Supervision/Assistance - 24 hour    Equipment Recommendations  None recommended by OT          Precautions / Restrictions Precautions Precautions: Fall Restrictions Weight Bearing Restrictions: No       Mobility Bed Mobility Overal bed mobility: Needs Assistance Bed Mobility: Rolling;Sidelying to Sit;Sit to Sidelying Rolling: Max assist Sidelying to sit: Max assist;+2 for physical assistance   Sit to supine: Max assist;Total assist;+2 for physical assistance   General bed mobility comments: increased assist due to pt lethargy, requires assist for LEs over EOB and to support trunk upright; assist to guide trunk and LEs when returning to supine in bed  Transfers                 General transfer comment: not attempted today due to pt lethargy    Balance Overall balance assessment: Needs assistance Sitting-balance support: Feet unsupported Sitting balance-Leahy Scale: Poor Sitting balance - Comments: requires  mod-maxA for static sitting balance; requires physical assist to maintain cervical extension                                    ADL either performed or assessed with clinical judgement   ADL Overall ADL's : Needs assistance/impaired Eating/Feeding: NPO     Grooming Details (indicate cue type and reason): hand over hand support to wash face with maximal-total assistance while sitting EOB; washed/combed hair after returning to supine with totalA                               General ADL Comments: pt sat EOB >10 min today with assist; maintained eyes closed throughout; completed passive stretching to UEs and attempted simple grooming ADLs                       Cognition Arousal/Alertness: Lethargic Behavior During Therapy: Flat affect Overall Cognitive Status: Impaired/Different from baseline                                 General Comments: pt continues to demonstrate limited interaction with therapies; maintained eyes closed throughout this session(limited interaction today)        Exercises Exercises: General Upper Extremity General Exercises - Upper Extremity Shoulder Flexion: PROM;5 reps;Seated;Both(within available range) Shoulder Extension: PROM;5 reps;Both;Seated(within available range) Shoulder ABduction: PROM;5 reps;Seated;Limitations Shoulder Abduction Limitations: within available range Shoulder ADduction: PROM;5 reps;Both;Seated Elbow Flexion: PROM;5 reps;Seated Elbow Extension: PROM;5 reps;Both;Seated  Shoulder Instructions       General Comments      Pertinent Vitals/ Pain       Pain Assessment: Faces Faces Pain Scale: Hurts even more Pain Location: generalized, though mostly while performing PROM to UEs  Pain Descriptors / Indicators: Grimacing;Moaning Pain Intervention(s): Monitored during session;Limited activity within patient's tolerance;Repositioned  Home Living                                           Prior Functioning/Environment              Frequency  Min 2X/week        Progress Toward Goals  OT Goals(current goals can now be found in the care plan section)  Progress towards OT goals: OT to reassess next treatment  Acute Rehab OT Goals Patient Stated Goal: none stated  OT Goal Formulation: With family Time For Goal Achievement: 08/17/18 Potential to Achieve Goals: Fair ADL Goals Pt Will Perform Grooming: with min assist;sitting Pt Will Perform Upper Body Bathing: with mod assist;sitting Pt Will Transfer to Toilet: with mod assist;bedside commode;squat pivot transfer Additional ADL Goal #1: Pt will complete bed mobility with min assist as precursor for ADL participation.  Plan Discharge plan remains appropriate    Co-evaluation    PT/OT/SLP Co-Evaluation/Treatment: Yes Reason for Co-Treatment: Complexity of the patient's impairments (multi-system involvement);For patient/therapist safety;To address functional/ADL transfers   OT goals addressed during session: Strengthening/ROM      AM-PAC PT "6 Clicks" Daily Activity     Outcome Measure   Help from another person eating meals?: Total Help from another person taking care of personal grooming?: Total Help from another person toileting, which includes using toliet, bedpan, or urinal?: Total Help from another person bathing (including washing, rinsing, drying)?: Total Help from another person to put on and taking off regular upper body clothing?: Total Help from another person to put on and taking off regular lower body clothing?: Total 6 Click Score: 6    End of Session    OT Visit Diagnosis: Muscle weakness (generalized) (M62.81);Hemiplegia and hemiparesis Hemiplegia - Right/Left: Right Hemiplegia - caused by: Cerebral infarction   Activity Tolerance Patient limited by lethargy   Patient Left in bed;with call bell/phone within reach;with bed alarm set;with family/visitor present    Nurse Communication Mobility status        Time: 3536-1443 OT Time Calculation (min): 29 min  Charges: OT General Charges $OT Visit: 1 Visit OT Treatments $Self Care/Home Management : 8-22 mins  [name], OT Supplemental Rehabilitation Services Pager [phone] Office [phone]    [name] 08/08/2018, 2:32 PM

## 2018-08-08 NOTE — Progress Notes (Signed)
Nutrition Follow-up  DOCUMENTATION CODES:   Not applicable  INTERVENTION:  Stop currently continuous tube feeding for anticipation of transitioning to bolus feedings.   At 1800, initiate a bolus tube feed at volume of 50 ml using new formula of Jevity 1.5 formula via PEG and increase by 100 ml every 6 hours to goal volume of 285 ml given QID.  Provide 30 ml Prostat (or equivalent) once daily per tube.   Continue free water flushes of 400 ml every 8 hours per tube (MD to adjust as appropriate).  Tube feeding regimen to provide 1810 kcal, 88 grams of protein, 2066 ml water.   RD to continue to monitor.   NUTRITION DIAGNOSIS:   Inadequate oral intake related to inability to eat as evidenced by NPO status; ongoing  GOAL:   Patient will meet greater than or equal to 90% of their needs; progressing  MONITOR:   TF tolerance, Labs, Skin, Weight trends, I & O's  REASON FOR ASSESSMENT:   Consult Enteral/tube feeding initiation and management  ASSESSMENT:   67 y.o. female with medical history significant of DM 2, CVA, anemia, GERD, HLD, HTN seizures disorder.Pseudotumor cerebri, chronic refractory headaches, debility, sp PEG and Trach now removed. MRI confirmed new CVA Extubated 10/6. 10/7 failed swallow evaluation. 10/18 PEG placed.   Tube feeding currently infusing at 30 ml/hr which is providing only 964 kcal (57% of kcal needs) and 55 grams of protein (73% of protein needs). RD consulted to reassess nutrition needs. Family desires transition of continuous tube feeds to bolus feeds. RD to modify tube feeding orders. Pt continues on NPO status. Family report pt able to po  Bites of puree/pudding thick liquids when pt fully alert, however pt remains lethargic and somnolent.   Labs and medications reviewed. Ammonia elevated at 54.  Diet Order:   Diet Order            Diet NPO time specified Except for: Ice Chips  Diet effective now              EDUCATION NEEDS:   Not  appropriate for education at this time  Skin:  Skin Assessment: Reviewed RN Assessment  Last BM:  10/20  Height:   Ht Readings from Last 1 Encounters:  07/13/18 5\' 2"  (1.575 m)    Weight:   Wt Readings from Last 1 Encounters:  08/08/18 60.7 kg    Ideal Body Weight:  50 kg  BMI:  Body mass index is 24.48 kg/m.  Estimated Nutritional Needs:   Kcal:  1600-1800  Protein:  75-95 grams  Fluid:  > 1.6 L/day    Corrin Parker, MS, RD, LDN Pager # [phone] After hours/ weekend pager # [phone]

## 2018-08-09 LAB — GLUCOSE, CAPILLARY
GLUCOSE-CAPILLARY: 136 mg/dL — AB (ref 70–99)
GLUCOSE-CAPILLARY: 158 mg/dL — AB (ref 70–99)
GLUCOSE-CAPILLARY: 77 mg/dL (ref 70–99)
Glucose-Capillary: 110 mg/dL — ABNORMAL HIGH (ref 70–99)
Glucose-Capillary: 115 mg/dL — ABNORMAL HIGH (ref 70–99)
Glucose-Capillary: 134 mg/dL — ABNORMAL HIGH (ref 70–99)
Glucose-Capillary: 180 mg/dL — ABNORMAL HIGH (ref 70–99)
Glucose-Capillary: 274 mg/dL — ABNORMAL HIGH (ref 70–99)
Glucose-Capillary: 62 mg/dL — ABNORMAL LOW (ref 70–99)

## 2018-08-09 MED ORDER — INSULIN ASPART 100 UNIT/ML ~~LOC~~ SOLN
0.0000 [IU] | Freq: Three times a day (TID) | SUBCUTANEOUS | Status: DC
  Administered 2018-08-09: 5 [IU] via SUBCUTANEOUS
  Administered 2018-08-11: 1 [IU] via SUBCUTANEOUS

## 2018-08-09 MED ORDER — PROMETHAZINE HCL 25 MG/ML IJ SOLN
12.5000 mg | Freq: Once | INTRAMUSCULAR | Status: AC
  Administered 2018-08-09: 12.5 mg via INTRAVENOUS
  Filled 2018-08-09: qty 1

## 2018-08-09 MED ORDER — FREE WATER
400.0000 mL | Freq: Four times a day (QID) | Status: DC
  Administered 2018-08-09 – 2018-08-10 (×3): 400 mL

## 2018-08-09 MED ORDER — LACTULOSE 10 GM/15ML PO SOLN
30.0000 g | Freq: Two times a day (BID) | ORAL | Status: DC
  Administered 2018-08-09 – 2018-08-13 (×5): 30 g
  Filled 2018-08-09 (×6): qty 60

## 2018-08-09 MED ORDER — DEXTROSE 50 % IV SOLN
INTRAVENOUS | Status: AC
  Administered 2018-08-09: 50 mL
  Filled 2018-08-09: qty 50

## 2018-08-09 MED ORDER — INSULIN ASPART 100 UNIT/ML ~~LOC~~ SOLN: 0.0000 [IU] | Freq: Every day | SUBCUTANEOUS | Status: DC

## 2018-08-09 MED ORDER — INSULIN DETEMIR 100 UNIT/ML ~~LOC~~ SOLN
8.0000 [IU] | Freq: Every day | SUBCUTANEOUS | Status: DC
  Administered 2018-08-09 – 2018-08-12 (×3): 8 [IU] via SUBCUTANEOUS
  Filled 2018-08-09 (×6): qty 0.08

## 2018-08-09 NOTE — Progress Notes (Signed)
SLP Cancellation Note  Patient Details Name: [name] MRN: [id number] DOB: 1951-09-15   Cancelled treatment:       Reason Eval/Treat Not Completed: Medical issues which prohibited therapy. RN requested that SLP hold PO trials today as pt has been spitting up her TFs. Will f/u for readiness.   [name] 08/09/2018, 2:31 PM  [name], M.A. [place] Acute Environmental education officer [phone] Office [phone]

## 2018-08-09 NOTE — Progress Notes (Signed)
CSW following for discharge. Patient has insurance authorization to admit to SNF when medically stable.   CSW to follow.  Laveda Abbe, [place] Clinical Social Worker [phone]

## 2018-08-09 NOTE — Progress Notes (Signed)
FSBG reassess again as 158, will continue to monitor.

## 2018-08-09 NOTE — Progress Notes (Signed)
Pt had another vomiting epipsode. MD [place] Crane in room while episode occurred. MD advised to hold feedings for the remainder of the night. MAR updated and Night shift RN will be updated.

## 2018-08-09 NOTE — Discharge Instructions (Signed)

## 2018-08-09 NOTE — Progress Notes (Signed)
Attempted to give 1000 Jevity feeding of 266ml. During feeding, pt began to spit up what seemed to be feedings. Stopped feedings at 164ml. MD notified

## 2018-08-09 NOTE — Progress Notes (Addendum)
Subjective: Per daughter she is much more awake today however she still is very drowsy and slow to respond.  No further seizures noted.  EEG has been done and is negative.  I have discussed taking her off Neurontin with the daughter and she is okay with that.  Exam: Vitals:   08/09/18 0409 08/09/18 0827  BP: 136/60 137/66  Pulse: 79 (!) 109  Resp: 18 20  Temp: 98.7 F (37.1 C) 98.3 F (36.8 C)  SpO2: 95%     Physical Exam   HEENT-  Normocephalic, no lesions, without obvious abnormality.  Normal external eye and conjunctiva.  r Extremities- Warm, dry and intact Musculoskeletal-no joint tenderness, deformity or swelling Skin-warm and dry, no hyperpigmentation, vitiligo, or suspicious lesions    Neuro:  Mental Status: More awake today.  Able to answer questions however she is unaware of where she is, month, year.  She attempts to follow commands however when asked to show me her thumb she shows me her whole hand and is confused. Cranial Nerves: II: Initially started counting fingers, patient was correct initially however afterwards she was clearly guessing however she did blink to threat III,IV, VI: ptosis present bilaterally, extra-ocular motions intact bilaterally pupils equal, round, reactive to light and accommodation V,VII: Left facial droop, facial light touch sensation normal bilaterally VIII: hearing normal bilaterally  Motor: No significant tremor noted today however she does still have increased tone bilateral upper extremities.  Again withdraws to pain bilaterally equally. Sensory: Pinprick and light touch intact throughout, bilaterally Plantars: Right: downgoing   Left: downgoing     Medications:  Scheduled: . apixaban  5 mg Oral BID  . chlorhexidine  15 mL Mouth Rinse BID  . feeding supplement (JEVITY 1.5 CAL/FIBER)  285 mL Per Tube QID  . feeding supplement (PRO-STAT SUGAR FREE 64)  30 mL Per Tube Daily  . free water  400 mL Per Tube Q8H  . gemfibrozil  600  mg Per Tube BID AC  . guaiFENesin-dextromethorphan  15 mL Per Tube BID  . insulin aspart  0-15 Units Subcutaneous Q4H  . insulin detemir  8 Units Subcutaneous QHS  . ipratropium-albuterol  3 mL Nebulization TID  . lacosamide  200 mg Per Tube BID  . lactulose  30 g Per Tube BID  . levETIRAcetam  1,500 mg Per Tube BID  . mouth rinse  15 mL Mouth Rinse q12n4p  . metoprolol tartrate  25 mg Per Tube BID  . sodium chloride HYPERTONIC  4 mL Nebulization TID  . valproic acid  500 mg Per Tube QID    Pertinent Labs/Diagnostics: Depakote level 69 Ammonia level is 54  EEG: Showed diffuse disturbance in background slowing which is nonspecific and often seen in metabolic encephalopathy.   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  She has been on Keppra, Vimpat, Neurontin and Depakote.  Neurology was reconsulted as the patient has remained extremely lethargic over the last few weeks.  Recent EEG that was done yesterday did not show any epileptiform activity or spikes.  It did show slowing which is common with metabolic encephalopathy.  Neurontin was DC'd yesterday.  Patient appears to be more alert today.  Patient may become more alert over the next few days as the Neurontin clears her system.    Recommendations: -Continue Vimpat, Keppra at current dose - Continue Depakote at current dose, repeat Ammonia in 2 days  08/09/2018, 9:12 AM   NEUROHOSPITALIST ADDENDUM Performed a face to face diagnostic evaluation.   I have reviewed the contents of history and physical exam as documented by PA/ARNP/Resident and agree with above documentation.  I have discussed and formulated the above plan as documented. Edits to the note have been made as needed.  Patient appears more alert after discontinuing Neurontin.  We will continue Depakote despite mildly  elevated ammonia very effective in the past in controlling her seizures.  Patient was started on lactulose, however would watch for diarrhea.  I do not think the ammonia is significantly elevated and just needs to be trended.  Would recommend repeating ammonia level  in 2 days.    We will continue to follow the patient peripherally.  Spoke to the patient's daughter and stated that we would be happy to stop by if she had any questions/concerns.  I expect the patient will make slow improvements over time and eventually seizure when vacations can be further reduced gradually by outpatient neurologist.  Outpatient neurology follow up on discharge.     Karena Addison Bora Bost MD [facility] [phone]   If 7pm to 7am, please call on call as listed on AMION.

## 2018-08-09 NOTE — Progress Notes (Signed)
Inpatient Diabetes Program Recommendations  AACE/ADA: New Consensus Statement on Inpatient Glycemic Control (2019)  Target Ranges:  Prepandial:   less than 140 mg/dL      Peak postprandial:   less than 180 mg/dL (1-2 hours)      Critically ill patients:  140 - 180 mg/dL   Results for EARLA, CHARLIE (MRN [id number]) as of 08/09/2018 08:54  Ref. Range 08/08/2018 07:48 08/08/2018 11:31 08/08/2018 17:20 08/08/2018 20:11 08/09/2018 00:02 08/09/2018 04:14 08/09/2018 05:19 08/09/2018 07:55  Glucose-Capillary Latest Ref Range: 70 - 99 mg/dL 121 (H) 178 (H) 117 (H) 167 (H) 180 (H) 62 (L) 158 (H) 77   Review of Glycemic Control  Current orders for Inpatient glycemic control: Lantus 8 units QHS, Novolog 0-15 units Q4H  Inpatient Diabetes Program Recommendations:  Insulin - Basal: Noted Lantus decreased from 10 to 8 units QHS today. Correction (SSI): Please decrease Novolog correction to sensitive scale (0-9 units) Q4H.  Thanks, Barnie Alderman, RN, MSN, CDE Diabetes Coordinator Inpatient Diabetes Program [phone] (Team Pager from 8am to 5pm)

## 2018-08-09 NOTE — Progress Notes (Signed)
FSBG was 64, 50% dextrose given. Will reassess in 15 minutes.

## 2018-08-09 NOTE — Progress Notes (Signed)
Pt has had 3 episodes of vomiting. Vomit appears brown, and clumpy. Pt was provided with mouth care and suctioned.; (GI) residuals charted (see flowsheet)  MD [place] Crane notified.

## 2018-08-09 NOTE — Progress Notes (Signed)
PROGRESS NOTE  [name] ZTI:[id number] DOB: 27-Apr-1951 DOA: 07/13/2018 PCP: Morgan Manes, MD  HPI/Recap of past 3 hours: 67 year old F w/ a history of prior CVAs with carotid artery disease, hypertension, hyperlipidemia, normal pressure hydrocephalus status post shunt who was admitted with seizures and a new left MCA territory stroke.  Left ICA thrombus.Treated with heparin drip initially due to planned procedures and antiplatelets.   Had recurrent seizures and transferred to ICU 9/29 for Versed and seizure suppression. Intubated and ventilated 9/29.  After extubation, had persistent dysphagia status post cord track placement then later PEG tube placement by interventional radiology on 08/05/2018.  Hospital course complicated by lethargy.  Initially on Vimpat, Keppra, Depakote, and gabapentin.  Gabapentin stopped on 08/08/2018 as recommended by neurology due to persistent somnolence.  Repeated EEG done on 08/08/2018 with no recurrence of seizure.  Elevated ammonia level.  Started on lactulose.  No noted valproic acid toxicity.  08/09/2018: Patient seen and examined with her daughter at her bedside.  No acute events overnight.  This morning she is less somnolent and easily arousable to voices.  Denies any pain.   Assessment/Plan: Active Problems:   Acute CVA (cerebrovascular accident) ([place])   Type II diabetes mellitus with complication ([place])   Benign essential HTN   Hypokalemia   Encounter for intubation   CVA (cerebral vascular accident) ([place])   Diabetes mellitus ([place])   Status epilepticus ([place])   SOB (shortness of breath)   Advanced care planning/counseling discussion   Goals of care, counseling/discussion   Aspiration into airway  Left hemispheric acute CVA secondary to left ICA stenosis and left ICA thrombus Neurology following Anticoagulation as recommended by neurology Continue Eliquis   Acute metabolic encephalopathy unclear etiology possibly 2/2 to SE from anti  seizure meds vs hyperammonemia Per daughter at her baseline she is more alert Neurology re-consulted to reassess anti seizures medications on 08/08/2018 Reorient as indicated Stopped gabapentin as recommended by neuro  Hyperammonemia possibly from valproic acid Valproic Level -no toxicity Ammonia 54 from 34 Start lactulose Continue to monitor mental status  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 no reported recurrent seizures on last EEG done on 08/08/2018 Continue anti-seizures medications On Keppra, Vimpat, Depakote, dced gabapentin Seizure precautions  Type 2 diabetes complicated by hypoglycemia Once again decrease dose of Lantus to 8 units from 10 units from 18 units previously Start sensitive insulin sliding scale  Last A1c 6.3 from 07/13/2018 Avoid hypoglycemia Continue blood sugar check  Chronic normocytic anemia Repeat CBC in the morning No sign of overt bleeding  Resolved Dyselectrolytemia post repletion Obtain BMP in the morning  Severe dysphagia status post peg tube placement by IR on 08/05/18 Continue bolus feed Continue free water flushes  Continue to keep the head of the bed elevated during feedings at least 30 degree angle Aspiration precautions  AKI, resolving No history of prior CKD Baseline creatinine 0.7 Renal function continues to improve Creatinine 0.97 from 1.17 with GFR of 60 from 47 and BUN of 30 Suspect prerenal from dehydration Monitor urine output  Suspected chemical pneumonitis from possible aspiration No sign of systemic involvement Monitor fever curve and WBC Deep suctioning by RT twice daily Continue duo nebs every 6 hours Continue hypersaline nebs Continue Mucinex twice daily  Hyperkalemia, resolved  Ambulatory dysfunction PT recommended SNF Social worker consulted for SNF placement Fall precaution Continue PT OT      Code Status: DNR  Family Communication: Daughter at bedside  Disposition Plan:  SNF possibly  tomorrow Wednesday, 08/10/2018 when bed is available.  Consultants:  Neurology  IR  Procedures:  Peg tube placement 08/05/18  Antimicrobials:  None  DVT prophylaxis: eliquis    Objective: Vitals:   08/08/18 2357 08/09/18 0409 08/09/18 0827 08/09/18 0933  BP: 137/70 136/60 137/66   Pulse: (!) 102 79 (!) 109   Resp: 18 18 20    Temp: 98.7 F (37.1 C) 98.7 F (37.1 C) 98.3 F (36.8 C)   TempSrc: Axillary Axillary Axillary   SpO2: 95% 95%  100%  Weight:      Height:        Intake/Output Summary (Last 24 hours) at 08/09/2018 1421 Last data filed at 08/08/2018 1800 Gross per 24 hour  Intake 410 ml  Output -  Net 410 ml   Filed Weights   08/06/18 0332 08/07/18 0500 08/08/18 1020  Weight: 57.4 kg 59.3 kg 60.7 kg    Exam:  . General: 67 y.o. year-old female frail in no acute distress.  Somnolent but easily arousable to voices.   . Cardiovascular: Regular rate and rhythm with no rubs or gallops.  No JVD or thyromegaly noted. Marland Kitchen Respiratory: Mild rales at bases with no wheezes.  Poor respiratory effort. . Abdomen: Soft nontender nondistended with normal bowel sounds x4 quadrants. . Musculoskeletal: No lower extremity edema. 2/4 pulses in all 4 extremities. Marland Kitchen Psychiatry: Mood is appropriate for condition and setting   Data Reviewed: CBC: Recent Labs  Lab 08/03/18 0543 08/04/18 0322 08/05/18 0353 08/07/18 0447 08/08/18 0404  WBC 11.1* 8.1 9.5 11.0* 12.2*  HGB 11.3* 10.8* 10.4* 8.6* 9.2*  HCT 37.0 35.9* 33.4* 28.1* 29.6*  MCV 88.3 89.3 88.8 89.8 90.5  PLT 528* 470* 579* 356 096   Basic Metabolic Panel: Recent Labs  Lab 08/03/18 0543 08/03/18 2225 08/04/18 0322 08/05/18 0705 08/06/18 1120 08/08/18 0404  NA 136  --  137 138 136 138  K 5.4* 4.3 4.6 3.4* 4.9 4.4  CL 95*  --  96* 98 102 106  CO2 26  --  24 28 21* 25  GLUCOSE 112*  --  136* 121* 196* 138*  BUN 29*  --  30* 29* 14 12  CREATININE 1.01*  --  1.17* 0.97 0.87 0.74  CALCIUM  9.9  --  9.7 9.5 9.2 8.5*  MG  --   --   --   --  1.9  --   PHOS  --   --   --   --  3.5  --    GFR: Estimated Creatinine Clearance: 59.3 mL/min (by C-G formula based on SCr of 0.74 mg/dL). Liver Function Tests: No results for input(s): AST, ALT, ALKPHOS, BILITOT, PROT, ALBUMIN in the last 168 hours. No results for input(s): LIPASE, AMYLASE in the last 168 hours. Recent Labs  Lab 08/08/18 1226  AMMONIA 54*   Coagulation Profile: Recent Labs  Lab 08/05/18 0353  INR 0.95   Cardiac Enzymes: No results for input(s): CKTOTAL, CKMB, CKMBINDEX, TROPONINI in the last 168 hours. BNP (last 3 results) No results for input(s): PROBNP in the last 8760 hours. HbA1C: No results for input(s): HGBA1C in the last 72 hours. CBG: Recent Labs  Lab 08/08/18 2011 08/09/18 0002 08/09/18 0414 08/09/18 0519 08/09/18 0755  GLUCAP 167* 180* 62* 158* 77   Lipid Profile: No results for input(s): CHOL, HDL, LDLCALC, TRIG, CHOLHDL, LDLDIRECT in the last 72 hours. Thyroid Function Tests: No results for input(s): TSH, T4TOTAL, FREET4, T3FREE, THYROIDAB in the last 72 hours. Anemia  Panel: No results for input(s): VITAMINB12, FOLATE, FERRITIN, TIBC, IRON, RETICCTPCT in the last 72 hours. Urine analysis:    Component Value Date/Time   COLORURINE YELLOW 06/20/2017 0642   APPEARANCEUR HAZY (A) 06/20/2017 0642   LABSPEC 1.010 06/20/2017 0642   PHURINE 6.0 06/20/2017 0642   GLUCOSEU 50 (A) 06/20/2017 0642   HGBUR LARGE (A) 06/20/2017 0642   BILIRUBINUR NEGATIVE 06/20/2017 0642   KETONESUR NEGATIVE 06/20/2017 0642   PROTEINUR NEGATIVE 06/20/2017 0642   NITRITE POSITIVE (A) 06/20/2017 0642   LEUKOCYTESUR LARGE (A) 06/20/2017 0642   Sepsis Labs: @LABRCNTIP (procalcitonin:4,lacticidven:4)  )No results found for this or any previous visit (from the past 240 hour(s)).    Studies: No results found.  Scheduled Meds: . apixaban  5 mg Oral BID  . chlorhexidine  15 mL Mouth Rinse BID  . feeding  supplement (JEVITY 1.5 CAL/FIBER)  285 mL Per Tube QID  . feeding supplement (PRO-STAT SUGAR FREE 64)  30 mL Per Tube Daily  . free water  400 mL Per Tube Q8H  . gemfibrozil  600 mg Per Tube BID AC  . guaiFENesin-dextromethorphan  15 mL Per Tube BID  . insulin aspart  0-15 Units Subcutaneous Q4H  . insulin detemir  8 Units Subcutaneous QHS  . ipratropium-albuterol  3 mL Nebulization TID  . lacosamide  200 mg Per Tube BID  . lactulose  30 g Per Tube BID  . levETIRAcetam  1,500 mg Per Tube BID  . mouth rinse  15 mL Mouth Rinse q12n4p  . metoprolol tartrate  25 mg Per Tube BID  . sodium chloride HYPERTONIC  4 mL Nebulization TID  . valproic acid  500 mg Per Tube QID    Continuous Infusions: . sodium chloride 10 mL/hr at 07/28/18 2209     LOS: 27 days     Kayleen Memos, MD [facility] Pager [phone]  If 7PM-7AM, please contact night-coverage [email or web address] Password [email or web address] 08/09/2018, 2:21 PM

## 2018-08-10 ENCOUNTER — Inpatient Hospital Stay (HOSPITAL_COMMUNITY): Payer: Medicare Other

## 2018-08-10 LAB — GLUCOSE, CAPILLARY
GLUCOSE-CAPILLARY: 141 mg/dL — AB (ref 70–99)
GLUCOSE-CAPILLARY: 73 mg/dL (ref 70–99)
Glucose-Capillary: 106 mg/dL — ABNORMAL HIGH (ref 70–99)
Glucose-Capillary: 74 mg/dL (ref 70–99)
Glucose-Capillary: 120 mg/dL — ABNORMAL HIGH (ref 70–99)
Glucose-Capillary: 93 mg/dL (ref 70–99)

## 2018-08-10 LAB — CBC
HCT: 27.9 % — ABNORMAL LOW (ref 36.0–46.0)
Hemoglobin: 8.6 g/dL — ABNORMAL LOW (ref 12.0–15.0)
MCH: 28.3 pg (ref 26.0–34.0)
MCHC: 30.8 g/dL (ref 30.0–36.0)
MCV: 91.8 fL (ref 80.0–100.0)
NRBC: 0 % (ref 0.0–0.2)
PLATELETS: 270 10*3/uL (ref 150–400)
RBC: 3.04 MIL/uL — ABNORMAL LOW (ref 3.87–5.11)
RDW: 15.7 % — AB (ref 11.5–15.5)
WBC: 13.3 10*3/uL — AB (ref 4.0–10.5)

## 2018-08-10 LAB — BASIC METABOLIC PANEL
ANION GAP: 9 (ref 5–15)
BUN: 9 mg/dL (ref 8–23)
CALCIUM: 8.7 mg/dL — AB (ref 8.9–10.3)
CO2: 24 mmol/L (ref 22–32)
Chloride: 106 mmol/L (ref 98–111)
Creatinine, Ser: 0.72 mg/dL (ref 0.44–1.00)
GFR calc Af Amer: >60 mL/min (ref >60)
Glucose, Bld: 107 mg/dL — ABNORMAL HIGH (ref 70–99)
Potassium: 3.4 mmol/L — ABNORMAL LOW (ref 3.5–5.1)
Sodium: 139 mmol/L (ref 135–145)

## 2018-08-10 MED ORDER — SODIUM CHLORIDE 3 % IN NEBU
4.0000 mL | INHALATION_SOLUTION | Freq: Two times a day (BID) | RESPIRATORY_TRACT | Status: DC
  Administered 2018-08-10 – 2018-08-13 (×7): 4 mL via RESPIRATORY_TRACT
  Filled 2018-08-10 (×8): qty 4

## 2018-08-10 MED ORDER — LEVETIRACETAM IN NACL 1500 MG/100ML IV SOLN
1500.0000 mg | Freq: Two times a day (BID) | INTRAVENOUS | Status: DC
  Administered 2018-08-10 – 2018-08-14 (×9): 1500 mg via INTRAVENOUS
  Filled 2018-08-10 (×9): qty 100

## 2018-08-10 MED ORDER — POTASSIUM CHLORIDE 10 MEQ/100ML IV SOLN
10.0000 meq | INTRAVENOUS | Status: AC
  Administered 2018-08-10 (×2): 10 meq via INTRAVENOUS
  Filled 2018-08-10 (×2): qty 100

## 2018-08-10 MED ORDER — FREE WATER
100.0000 mL | Freq: Four times a day (QID) | Status: DC
  Administered 2018-08-10 – 2018-08-13 (×3): 100 mL
  Administered 2018-08-13: 10 mL
  Administered 2018-08-13 – 2018-08-14 (×3): 100 mL

## 2018-08-10 MED ORDER — VALPROATE SODIUM 500 MG/5ML IV SOLN
500.0000 mg | Freq: Four times a day (QID) | INTRAVENOUS | Status: DC
  Administered 2018-08-10 – 2018-08-14 (×15): 500 mg via INTRAVENOUS
  Filled 2018-08-10 (×19): qty 5

## 2018-08-10 MED ORDER — IPRATROPIUM-ALBUTEROL 0.5-2.5 (3) MG/3ML IN SOLN
3.0000 mL | Freq: Two times a day (BID) | RESPIRATORY_TRACT | Status: DC
  Administered 2018-08-10 – 2018-08-13 (×7): 3 mL via RESPIRATORY_TRACT
  Filled 2018-08-10 (×9): qty 3

## 2018-08-10 MED ORDER — SODIUM CHLORIDE 0.9 % IV SOLN
200.0000 mg | Freq: Two times a day (BID) | INTRAVENOUS | Status: DC
  Administered 2018-08-10 – 2018-08-14 (×9): 200 mg via INTRAVENOUS
  Filled 2018-08-10 (×10): qty 20

## 2018-08-10 NOTE — Progress Notes (Signed)
PROGRESS NOTE    CHANDRA ASHER  ZLD:[id number] DOB: May 01, 1951 DOA: 07/13/2018 PCP: [name], MD  Brief Narrative:    67 year old F w/ a history of prior CVAs with carotid artery disease, hypertension, hyperlipidemia, normal pressure hydrocephalus status post shunt who was admitted with seizures and a new left MCA territory stroke.  Left ICA thrombus.Treated with heparin drip initially due to planned procedures and antiplatelets.   Had recurrent seizures and transferred to ICU 9/29 for Versed and seizure suppression. Intubated and ventilated 9/29.  After extubation, had persistent dysphagia status post cord track placement then later PEG tube placement by interventional radiology on 08/05/2018.  Hospital course complicated by lethargy.  Initially on Vimpat, Keppra, Depakote, and gabapentin.  Gabapentin stopped on 08/08/2018 as recommended by neurology due to persistent somnolence.  Repeated EEG done on 08/08/2018 with no recurrence of seizure.  Elevated ammonia level.  Started on lactulose.  No noted valproic acid toxicity.   08/10/2018 Patient seen and examined with her daughter at bedside patient has been vomiting for the last 24 hours with any type of PEG tube feeds, free water infusion or medications at this time.    Assessment & Plan:   Active Problems:   Acute CVA (cerebrovascular accident) ([place])   Type II diabetes mellitus with complication ([place])   Benign essential HTN   Hypokalemia   Encounter for intubation   CVA (cerebral vascular accident) ([place])   Diabetes mellitus ([place])   Status epilepticus ([place])   SOB (shortness of breath)   Advanced care planning/counseling discussion   Goals of care, counseling/discussion   Aspiration into airway   Left hemispheric acute CVA secondary to left ICA stenosis in ICA thrombus Continue Eliquis.    Acute metabolic encephalopathy secondary to seizures versus hyper ammonemia. Repeat ammonia level in the morning.   Neurology reconsulted and recommended seizure medications with IV Keppra, valproate and Vimpat.  Stop gabapentin as per recommendations by neurology.   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  Seizure precautions.  Type 2 diabetes CBG (last 3)  Recent Labs    08/10/18 0805 08/10/18 1219 08/10/18 1641  GLUCAP 73 74 120*    last A1c 6.3.  Try to avoid hypoglycemia.    Anemia of chronic disease No signs of overt bleeding Transfuse to keep hemoglobin greater than 7.     Severe dysphagia status post PEG tube placement by IR on October 18. Currently tube feeds on hold due to persistent vomiting in the last 24 hours.    Persistent nausea and vomiting Abdominal x-ray showed adynamic ileus versus gastroenteritis probably triggered by lactulose. Replete electrolytes and start with free water boluses and probably start tube feeds in the morning.    Acute kidney injury secondary to dehydration  resolving with IV hydration.   Chemical pneumonitis from possible aspiration Continue with Mucinex. Patient remains afebrile at this time.      DVT prophylaxis: Eliquis Code Status: DNR Family Communication: Discussed of treatment and care and plan with the daughter at bedside Disposition Plan: SNF on discharge  Consultants:   Neurology  IR  Procedures: PEG tube placement by IR on October 18.  Antimicrobials: None   Subjective: Pt had vomiting over the last 24 hours as per RN and daughter at bedside.  Currently tube feeds are on hold.  Objective: Vitals:   08/10/18 0038 08/10/18 0419 08/10/18 0722 08/10/18 0800  BP:  136/71 136/71 136/70  Pulse:  90 92 94  Resp:  16  16 20  Temp: 100.3 F (37.9 C) 97.8 F (36.6 C)  98.5 F (36.9 C)  TempSrc: Axillary Axillary  Axillary  SpO2:    97%  Weight:  60.5 kg    Height:        Intake/Output Summary (Last 24 hours) at 08/10/2018 1108 Last data filed at 08/10/2018 0600 Gross per  24 hour  Intake 450 ml  Output 28 ml  Net 422 ml   Filed Weights   08/07/18 0500 08/08/18 1020 08/10/18 0419  Weight: 59.3 kg 60.7 kg 60.5 kg    Examination:  General exam: Appears calm and comfortable  Respiratory system: Clear to auscultation. Respiratory effort normal. Cardiovascular system: S1 & S2 heard, RRR. No JVD No pedal edema. Gastrointestinal system: Abdomen is midly distended, bowel sounds okay. Non tender, PEG in place Central nervous system: sleeping comfortably., mumbling. Confused.  Extremities: no pedal edema, cyanosis or clubbing.  Skin: No rashes, lesions or ulcers Psychiatry: no agitation overnight.     Data Reviewed: I have personally reviewed following labs and imaging studies  CBC: Recent Labs  Lab 08/04/18 0322 08/05/18 0353 08/07/18 0447 08/08/18 0404 08/10/18 0535  WBC 8.1 9.5 11.0* 12.2* 13.3*  HGB 10.8* 10.4* 8.6* 9.2* 8.6*  HCT 35.9* 33.4* 28.1* 29.6* 27.9*  MCV 89.3 88.8 89.8 90.5 91.8  PLT 470* 579* 356 322 419   Basic Metabolic Panel: Recent Labs  Lab 08/04/18 0322 08/05/18 0705 08/06/18 1120 08/08/18 0404 08/10/18 0535  NA 137 138 136 138 139  K 4.6 3.4* 4.9 4.4 3.4*  CL 96* 98 102 106 106  CO2 24 28 21* 25 24  GLUCOSE 136* 121* 196* 138* 107*  BUN 30* 29* 14 12 9   CREATININE 1.17* 0.97 0.87 0.74 0.72  CALCIUM 9.7 9.5 9.2 8.5* 8.7*  MG  --   --  1.9  --   --   PHOS  --   --  3.5  --   --    GFR: Estimated Creatinine Clearance: 59.3 mL/min (by C-G formula based on SCr of 0.72 mg/dL). Liver Function Tests: No results for input(s): AST, ALT, ALKPHOS, BILITOT, PROT, ALBUMIN in the last 168 hours. No results for input(s): LIPASE, AMYLASE in the last 168 hours. Recent Labs  Lab 08/08/18 1226  AMMONIA 54*   Coagulation Profile: Recent Labs  Lab 08/05/18 0353  INR 0.95   Cardiac Enzymes: No results for input(s): CKTOTAL, CKMB, CKMBINDEX, TROPONINI in the last 168 hours. BNP (last 3 results) No results for input(s):  PROBNP in the last 8760 hours. HbA1C: No results for input(s): HGBA1C in the last 72 hours. CBG: Recent Labs  Lab 08/09/18 2204 08/09/18 2358 08/10/18 0114 08/10/18 0427 08/10/18 0805  GLUCAP 115* 136* 141* 106* 73   Lipid Profile: No results for input(s): CHOL, HDL, LDLCALC, TRIG, CHOLHDL, LDLDIRECT in the last 72 hours. Thyroid Function Tests: No results for input(s): TSH, T4TOTAL, FREET4, T3FREE, THYROIDAB in the last 72 hours. Anemia Panel: No results for input(s): VITAMINB12, FOLATE, FERRITIN, TIBC, IRON, RETICCTPCT in the last 72 hours. Sepsis Labs: Recent Labs  Lab 08/08/18 0404  PROCALCITON 0.12  LATICACIDVEN 1.5    No results found for this or any previous visit (from the past 240 hour(s)).       Radiology Studies: No results found.      Scheduled Meds: . apixaban  5 mg Oral BID  . chlorhexidine  15 mL Mouth Rinse BID  . feeding supplement (JEVITY 1.5 CAL/FIBER)  285 mL  Per Tube QID  . feeding supplement (PRO-STAT SUGAR FREE 64)  30 mL Per Tube Daily  . free water  400 mL Per Tube Q6H  . gemfibrozil  600 mg Per Tube BID AC  . guaiFENesin-dextromethorphan  15 mL Per Tube BID  . insulin aspart  0-5 Units Subcutaneous QHS  . insulin aspart  0-9 Units Subcutaneous TID WC  . insulin detemir  8 Units Subcutaneous QHS  . ipratropium-albuterol  3 mL Nebulization TID  . lacosamide  200 mg Per Tube BID  . lactulose  30 g Per Tube BID  . levETIRAcetam  1,500 mg Per Tube BID  . mouth rinse  15 mL Mouth Rinse q12n4p  . metoprolol tartrate  25 mg Per Tube BID  . sodium chloride HYPERTONIC  4 mL Nebulization TID  . valproic acid  500 mg Per Tube QID   Continuous Infusions: . sodium chloride 10 mL/hr at 07/28/18 2209     LOS: 28 days    Time spent: 35 minutes    Hosie Poisson, MD [facility] Pager [phone] If 7PM-7AM, please contact night-coverage [email or web address] Password [email or web address] 08/10/2018, 11:08 AM

## 2018-08-10 NOTE — Progress Notes (Signed)
Nutrition Follow-up  DOCUMENTATION CODES:   Not applicable  INTERVENTION:  Continue to hold tube feeding.  Once able to restart nutrition once ileus improves, Recommend transitioning back to continuous tube feeds. Jevity 1.5 formula via PEG at 20 ml/hr and advance by 10 ml every 4-6 hours or as tolerated to goal rate of 50 ml/hr.   Discontinue Prostat.   Continue free water 400 ml every 6 hours per tube (MD to adjust as appropriate)   Tube feeding to provide 1800 kcal, 77 grams of protein, 2512 ml water.   NUTRITION DIAGNOSIS:   Inadequate oral intake related to inability to eat as evidenced by NPO status; ongoing  GOAL:   Patient will meet greater than or equal to 90% of their needs; not met  MONITOR:   TF tolerance, Labs, Skin, Weight trends, I & O's  REASON FOR ASSESSMENT:   Consult Enteral/tube feeding initiation and management  ASSESSMENT:   67 y.o. female with medical history significant of DM 2, CVA, anemia, GERD, HLD, HTN seizures disorder.Pseudotumor cerebri, chronic refractory headaches, debility, sp PEG and Trach now removed. MRI confirmed new CVA 10/18 PEG placed.   Pt with vomiting with bolus tube feeds despite slow rate bolus infusion x 1 hr via pump per RN. Pt underwent abdominal radiographs this AM which revealed mild ileus. Tube feedings have been put on hold per MD orders. Once able to restart nutrition once ileus improves, recommend continuous tube feeds to monitor tolerance. Labs and medications reviewed.   Diet Order:   Diet Order            Diet NPO time specified Except for: Ice Chips  Diet effective now              EDUCATION NEEDS:   Not appropriate for education at this time  Skin:  Skin Assessment: Reviewed RN Assessment  Last BM:  10/23  Height:   Ht Readings from Last 1 Encounters:  07/13/18 _0  (1.575 m)    Weight:   Wt Readings from Last 1 Encounters:  08/10/18 60.5 kg    Ideal Body Weight:  50 kg  BMI:  Body  mass index is 24.4 kg/m.  Estimated Nutritional Needs:   Kcal:  1600-1800  Protein:  75-95 grams  Fluid:  > 1.6 L/day    Corrin Parker, MS, RD, LDN Pager # [phone] After hours/ weekend pager # [phone]

## 2018-08-10 NOTE — Progress Notes (Signed)
Pt vomited x 1 this shift medical personal on call notified  New order for phenergan ordered  Administered with good result. Pt also had watery stool r/t lacto luce given [place] and made comfortable with reposition.

## 2018-08-10 NOTE — Progress Notes (Signed)
SLP Cancellation Note  Patient Details Name: [name] MRN: [id number] DOB: 1951/04/14   Cancelled treatment:       Reason Eval/Treat Not Completed: Medical issues which prohibited therapy. Pt continues to spit up TFs; ST will continue to f/u for readiness.  Azavion Bouillon H. Roddie Mc, CCC-SLP Speech Language Pathologist    Wende Bushy 08/10/2018, 3:13 PM

## 2018-08-11 ENCOUNTER — Inpatient Hospital Stay (HOSPITAL_COMMUNITY): Payer: Medicare Other

## 2018-08-11 DIAGNOSIS — R627 Adult failure to thrive: Secondary | ICD-10-CM

## 2018-08-11 DIAGNOSIS — R1310 Dysphagia, unspecified: Secondary | ICD-10-CM

## 2018-08-11 DIAGNOSIS — Z7189 Other specified counseling: Secondary | ICD-10-CM

## 2018-08-11 LAB — CBC
HCT: 28.7 % — ABNORMAL LOW (ref 36.0–46.0)
HEMOGLOBIN: 8.7 g/dL — AB (ref 12.0–15.0)
MCH: 27.5 pg (ref 26.0–34.0)
MCHC: 30.3 g/dL (ref 30.0–36.0)
MCV: 90.8 fL (ref 80.0–100.0)
PLATELETS: 298 10*3/uL (ref 150–400)
RBC: 3.16 MIL/uL — AB (ref 3.87–5.11)
RDW: 15.1 % (ref 11.5–15.5)
WBC: 13.6 10*3/uL — AB (ref 4.0–10.5)
nRBC: 0 % (ref 0.0–0.2)

## 2018-08-11 LAB — GLUCOSE, CAPILLARY
GLUCOSE-CAPILLARY: 167 mg/dL — AB (ref 70–99)
GLUCOSE-CAPILLARY: 129 mg/dL — AB (ref 70–99)
Glucose-Capillary: 138 mg/dL — ABNORMAL HIGH (ref 70–99)
Glucose-Capillary: 120 mg/dL — ABNORMAL HIGH (ref 70–99)
Glucose-Capillary: 91 mg/dL (ref 70–99)
Glucose-Capillary: 113 mg/dL — ABNORMAL HIGH (ref 70–99)

## 2018-08-11 LAB — COMPREHENSIVE METABOLIC PANEL
ALBUMIN: 2.3 g/dL — AB (ref 3.5–5.0)
ALT: 11 U/L (ref 0–44)
ANION GAP: 11 (ref 5–15)
AST: 17 U/L (ref 15–41)
Alkaline Phosphatase: 91 U/L (ref 38–126)
BUN: 7 mg/dL — ABNORMAL LOW (ref 8–23)
CHLORIDE: 105 mmol/L (ref 98–111)
CO2: 24 mmol/L (ref 22–32)
Calcium: 9.1 mg/dL (ref 8.9–10.3)
Creatinine, Ser: 0.76 mg/dL (ref 0.44–1.00)
GFR calc non Af Amer: >60 mL/min (ref >60)
GLUCOSE: 113 mg/dL — AB (ref 70–99)
Potassium: 3.4 mmol/L — ABNORMAL LOW (ref 3.5–5.1)
Sodium: 140 mmol/L (ref 135–145)
Total Bilirubin: 0.4 mg/dL (ref 0.3–1.2)
Total Protein: 6.3 g/dL — ABNORMAL LOW (ref 6.5–8.1)

## 2018-08-11 LAB — VALPROIC ACID LEVEL: VALPROIC ACID LVL: 81 ug/mL (ref 50.0–100.0)

## 2018-08-11 LAB — LIPASE, BLOOD: Lipase: 24 U/L (ref 11–51)

## 2018-08-11 LAB — MAGNESIUM: MAGNESIUM: 1.7 mg/dL (ref 1.7–2.4)

## 2018-08-11 MED ORDER — FAMOTIDINE IN NACL 20-0.9 MG/50ML-% IV SOLN
20.0000 mg | INTRAVENOUS | Status: DC
  Administered 2018-08-11 – 2018-08-14 (×4): 20 mg via INTRAVENOUS
  Filled 2018-08-11 (×4): qty 50

## 2018-08-11 MED ORDER — METOPROLOL TARTRATE 5 MG/5ML IV SOLN
5.0000 mg | Freq: Four times a day (QID) | INTRAVENOUS | Status: DC
  Administered 2018-08-11 – 2018-08-14 (×11): 5 mg via INTRAVENOUS
  Filled 2018-08-11 (×11): qty 5

## 2018-08-11 MED ORDER — POTASSIUM CHLORIDE 10 MEQ/100ML IV SOLN
10.0000 meq | INTRAVENOUS | Status: AC
  Administered 2018-08-11 (×2): 10 meq via INTRAVENOUS
  Filled 2018-08-11 (×2): qty 100

## 2018-08-11 MED ORDER — ENOXAPARIN SODIUM 60 MG/0.6ML ~~LOC~~ SOLN
60.0000 mg | Freq: Two times a day (BID) | SUBCUTANEOUS | Status: DC
  Administered 2018-08-11 – 2018-08-13 (×5): 60 mg via SUBCUTANEOUS
  Filled 2018-08-11 (×5): qty 0.6

## 2018-08-11 NOTE — Progress Notes (Signed)
PT Cancellation Note  Patient Details Name: [name] MRN: [id number] DOB: 09/26/1951   Cancelled Treatment:    Reason Eval/Treat Not Completed: (P) Medical issues which prohibited therapy(Pt currently sedated and not appropriate for therapy this pm.  Will f/u per POC pending presentation. )   Cephus Tupy [name] 08/11/2018, 1:38 PM  Governor Rooks, PTA Acute Rehabilitation Services Pager [phone] Office [phone]

## 2018-08-11 NOTE — Progress Notes (Signed)
OT Cancellation Note  Patient Details Name: [name] MRN: [id number] DOB: 03-21-1951   Cancelled Treatment:    Reason Eval/Treat Not Completed: Medical issues which prohibited therapy. Pt is continuing to feel ill and is vomiting. OT will attempt again when pt is able to participate.  [name] P 08/11/2018, 10:32 AM

## 2018-08-11 NOTE — Progress Notes (Signed)
Patient ID: [name], female   DOB: [birth year], 67 y.o.   MRN: [id number]  This NP visited patient at the bedside as a follow up for palliative medicine needs and emotional support at request of attending.  Meet with daughter/Rebecca and we reviewed the course of patient's hospitalization as noted below.  67 year old F w/ a history of prior CVAs with carotid artery disease, hypertension, hyperlipidemia, normal pressure hydrocephalus status post shunt who was admitted with seizures and a new left MCA territory stroke.Left ICA thrombus.Treated with heparin drip initially due to planned proceduresand antiplatelets.   Had recurrent seizures and transferred to ICU 9/29 for Versed and seizure suppression. Intubated and ventilated 9/29.After extubation, had persistent dysphagia status post cord track placement then later PEG tube placement by interventional radiology on 08/05/2018.  Hospital course complicated by lethargy. Initially on Vimpat, Keppra, Depakote, and gabapentin. Gabapentin stopped on 08/08/2018 as recommended by neurology due to persistent somnolence. Repeated EEG done on 08/08/2018 with no recurrence of seizure. Elevated ammonia level. Started on lactulose. No noted valproic acid toxicity.  Created space and opportunity for [name] to explore her thoughts and feeling regarding the current situation, she is tearful a she acknowledges that "things are not going well".  She wishes her mother would begin to show more signs of improvement.  We discussed human mortality and limitations of medical interventions when a body begins to fail to thrive and the importance of consideration of quality of life.  Family in a place/mindset to continue to treat the treatable and hope for improvement.    Discussed  importance of continued conversation with family and the medical providers regarding overall plan of care and treatment options,  ensuring decisions are within the context of  the patient's best interest and her  values and GOCs.  Questions and concerns addressed     [name] is open to visit from spiritual care. Placed call to [facility] the Palliative chaplain, she will f/u in the morning with patient and family for spiritual support.   Total time spent on the unit was 35 minutes  Greater than 50% of the time was spent in counseling and coordination of care  Wadie Lessen NP  Palliative Medicine Team Team Phone # [phone] Pager [phone]

## 2018-08-11 NOTE — Progress Notes (Signed)
PT had an episode of vomiting, Moderate amount, with brown tint. MD Karleen Hampshire page notified. MD advised to continue to keep PT NPO and not push anything through tube.

## 2018-08-11 NOTE — Progress Notes (Signed)
MD notified about pt condition, advised to administered all IV meds and hold all other meds including water and feeding.

## 2018-08-11 NOTE — Progress Notes (Signed)
Pt is moaning and crying that she had a headache. MD Karleen Hampshire page notified. MD advised to try giving Tylenol through PEG, and see if pt can tolerate. Pt has not vomited. Will advise oncoming RN to monitor pt for any vomiting after administration of Tylenol

## 2018-08-11 NOTE — Progress Notes (Signed)
ANTICOAGULATION CONSULT NOTE - Initial Consult  Pharmacy Consult for lovenox Indication: carotid thrombus/CVA  Allergies  Allergen Reactions  . Carbamazepine Hives, Shortness Of Breath and Other (See Comments)  . Penicillins Anaphylaxis, Other (See Comments) and Swelling    Mother, father and brother have history of anaphylaxis reaction to penicillin so pt does not take  My throat swells Familial history of anaphylaxis   . Statins Other (See Comments)    Severe muscle weakness, leg numbness, severe headaches, chest pain   . Tricyclic Antidepressants Other (See Comments)    IMPAIRED MEMORY  . Atorvastatin Other (See Comments)    Severe muscle weakness, leg numbness, severe headaches, chest pain   . Ezetimibe Other (See Comments)    Severe muscle weakness, leg numbness, severe headaches, chest pain   . Nortriptyline Other (See Comments)    IMPAIRED MEMORY  . Quinapril Hcl Other (See Comments)    Unknown reaction  . Rifampin Diarrhea  . Metoclopramide Nausea And Vomiting and Rash    Patient Measurements: Height: 5\' 2"  (157.5 cm) Weight: 133 lb 6.1 oz (60.5 kg) IBW/kg (Calculated) : 50.1 Heparin Dosing Weight:   Vital Signs: Temp: 98.7 F (37.1 C) (10/24 1638) Temp Source: Axillary (10/24 1638) BP: 138/70 (10/24 1638) Pulse Rate: 102 (10/24 1638)  Labs: Recent Labs    08/10/18 0535 08/11/18 1120  HGB 8.6* 8.7*  HCT 27.9* 28.7*  PLT 270 298  CREATININE 0.72 0.76    Estimated Creatinine Clearance: 59.3 mL/min (by C-G formula based on SCr of 0.76 mg/dL).   Medical History: Past Medical History:  Diagnosis Date  . Anemia    takes iron supplement  . Anesthesia complication    disorientation due to pseudotumor  . Diabetes mellitus    IDDM  . Family history of anesthesia complication 50 yrs ago   brother stopped breathing for a minute or two  . GERD (gastroesophageal reflux disease)   . Headache(784.0)    due to pseudotumor; daily headache  .  Hypercholesteremia    unable to tolerate statins  . Hypertension    under control with meds., has been on med. x 5 yr.  . Peripheral neuropathy   . PONV (postoperative nausea and vomiting)   . 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 Synovitis of ankle 04/2013   left  . Wears dentures    full    Medications:    Assessment: Pt has been transition from IV heparin to apixaban for her carotid thrombus and CVA. She has had issue with the PEG now. Lovenox has been ordered bridging. We will clarify with MD to see if IV heparin is more feasible due to her risk.   Goal of Therapy:  Anti-Xa level 0.6-1 units/ml 4hrs after LMWH dose given Monitor platelets by anticoagulation protocol: Yes   Plan:  Dc apixaban Lovenox 60mg  SQ BID F/u to see if heparin is more feasible  Onnie Boer, PharmD, [place], AAHIVP, CPP Infectious Disease Pharmacist Pager: [phone] 08/11/2018 10:57 PM

## 2018-08-11 NOTE — Progress Notes (Signed)
SLP Cancellation Note  Patient Details Name: [name] MRN: [id number] DOB: May 03, 1951   Cancelled treatment:       Reason Eval/Treat Not Completed: Medical issues which prohibited therapy. Chart review indicates continued vomiting. Will continue efforts.  Celia B. Quentin Ore [facility], CCC-SLP Speech Language Pathologist [phone]  Shonna Chock 08/11/2018, 9:47 AM

## 2018-08-11 NOTE — Progress Notes (Signed)
Patient vomited large amount  Of mucousy liquid  Once in this shift hour after administered schedule 100 ml water through the peg tube including some liquid meds.Marland Kitchen

## 2018-08-11 NOTE — Progress Notes (Signed)
PROGRESS NOTE    [name]  PRF:[id number] DOB: 12/14/1950 DOA: 07/13/2018 PCP: [name], MD  Brief Narrative:    67 year old F w/ a history of prior CVAs with carotid artery disease, hypertension, hyperlipidemia, normal pressure hydrocephalus status post shunt who was admitted with seizures and a new left MCA territory stroke.  Left ICA thrombus.Treated with heparin drip initially due to planned procedures and antiplatelets.   Had recurrent seizures and transferred to ICU 9/29 for Versed and seizure suppression. Intubated and ventilated 9/29.  After extubation, had persistent dysphagia status post cord track placement then later PEG tube placement by interventional radiology on 08/05/2018.  Hospital course complicated by lethargy.  Initially on Vimpat, Keppra, Depakote, and gabapentin.  Gabapentin stopped on 08/08/2018 as recommended by neurology due to persistent somnolence.  Repeated EEG done on 08/08/2018 with no recurrence of seizure.  Elevated ammonia level.  Started on lactulose.  No noted valproic acid toxicity.   08/10/2018 Pt seen and examined with her daughter at bedside. Pt had vomited last night and this am with water boluses via PEG tube.  She remains lethargic, not responding to verbal cues and moaning.     Assessment & Plan:   Active Problems:   Acute CVA (cerebrovascular accident) ([place])   Type II diabetes mellitus with complication ([place])   Benign essential HTN   Hypokalemia   Encounter for intubation   CVA (cerebral vascular accident) ([place])   Diabetes mellitus ([place])   Status epilepticus ([place])   SOB (shortness of breath)   Advanced care planning/counseling discussion   Goals of care, counseling/discussion   Aspiration into airway   Left hemispheric acute CVA secondary to left ICA stenosis in ICA thrombus Pt unable to tolerate anything via PEG tube at this time. cchanged oral eliquis to lovenox for now.  Plan to resume Eliquis when able to  tolerate PEG feeds.    Acute metabolic encephalopathy secondary to seizures versus hyper ammonemia.  Neurology reconsulted and recommended seizure medications with Keppra, valproate and Vimpat, changed to IV as she is unable to tolerate anything by PEG .  Stop gabapentin as per recommendations by neurology. Patient continues to be lethargic, and not responsive to verbal cues.    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  Seizure precautions.  Type 2 diabetes CBG (last 3)  Recent Labs    08/11/18 0419 08/11/18 0814 08/11/18 1122  GLUCAP 138* 120* 91    last A1c 6.3.  Try to avoid hypoglycemia.    Anemia of chronic disease No signs of overt bleeding Transfuse to keep hemoglobin greater than 7. Hemoglobin stable around 8.      Severe dysphagia status post PEG tube placement by IR on October 18. Currently tube feeds on hold due to persistent vomiting in the last 48 hours.   Persistent nausea and vomiting Abdominal x-ray showed adynamic ileus versus gastroenteritis probably triggered by lactulose. Replete electrolytes.  CT abd and pelvis ordered for further evaluation.  IR reconsulted to see if PEG tube position needs to be changed.     Acute kidney injury secondary to dehydration  resolving with IV hydration.   Chemical pneumonitis from possible aspiration Continue with Mucinex. Patient remains afebrile at this time. CXR showed improved left lung opacity.        DVT prophylaxis: Lovenox.  Code Status: DNR Family Communication: Discussed of treatment, care and plan with the daughter at bedside Disposition Plan: SNF on discharge  Consultants:   Neurology  IR  Procedures: PEG tube placement by IR on October 18.   Antimicrobials: None   Subjective: Persistent vomiting yesterday night and this am with meds and just plain water boluses.  No fever. Pt moaning on exam.   Objective: Vitals:   08/10/18 2104 08/11/18  0415 08/11/18 0730 08/11/18 0815  BP:  (!) 141/63 (!) 141/63   Pulse:  94 (!) 103   Resp:   18 15  Temp:  98.4 F (36.9 C)  98.5 F (36.9 C)  TempSrc:  Axillary  Axillary  SpO2: 97% 97% 100%   Weight:      Height:       No intake or output data in the 24 hours ending 08/11/18 1205 Filed Weights   08/07/18 0500 08/08/18 1020 08/10/18 0419  Weight: 59.3 kg 60.7 kg 60.5 kg    Examination:  General exam: lethargic, sickly appearing, moaning on exam.  Respiratory system:  Coarse breath sounds , diminished at bases,no wheezing heard.  Cardiovascular system: S1 & S2 heard, RRR. No JVD No pedal edema. Gastrointestinal system: Abdomen is midly distended, PEG site appears good, tender oon exam as pt moans on palpation.  Central nervous system: lethargic, no coherent, able to move all extremities.  Extremities: no pedal edema, cyanosis or clubbing.  Skin: No rashes, lesions or ulcers Psychiatry: no agitation overnight.     Data Reviewed: I have personally reviewed following labs and imaging studies  CBC: Recent Labs  Lab 08/05/18 0353 08/07/18 0447 08/08/18 0404 08/10/18 0535 08/11/18 1120  WBC 9.5 11.0* 12.2* 13.3* 13.6*  HGB 10.4* 8.6* 9.2* 8.6* 8.7*  HCT 33.4* 28.1* 29.6* 27.9* 28.7*  MCV 88.8 89.8 90.5 91.8 90.8  PLT 579* 356 322 270 983   Basic Metabolic Panel: Recent Labs  Lab 08/05/18 0705 08/06/18 1120 08/08/18 0404 08/10/18 0535  NA 138 136 138 139  K 3.4* 4.9 4.4 3.4*  CL 98 102 106 106  CO2 28 21* 25 24  GLUCOSE 121* 196* 138* 107*  BUN 29* 14 12 9   CREATININE 0.97 0.87 0.74 0.72  CALCIUM 9.5 9.2 8.5* 8.7*  MG  --  1.9  --   --   PHOS  --  3.5  --   --    GFR: Estimated Creatinine Clearance: 59.3 mL/min (by C-G formula based on SCr of 0.72 mg/dL). Liver Function Tests: No results for input(s): AST, ALT, ALKPHOS, BILITOT, PROT, ALBUMIN in the last 168 hours. No results for input(s): LIPASE, AMYLASE in the last 168 hours. Recent Labs  Lab  08/08/18 1226  AMMONIA 54*   Coagulation Profile: Recent Labs  Lab 08/05/18 0353  INR 0.95   Cardiac Enzymes: No results for input(s): CKTOTAL, CKMB, CKMBINDEX, TROPONINI in the last 168 hours. BNP (last 3 results) No results for input(s): PROBNP in the last 8760 hours. HbA1C: No results for input(s): HGBA1C in the last 72 hours. CBG: Recent Labs  Lab 08/10/18 2025 08/11/18 0108 08/11/18 0419 08/11/18 0814 08/11/18 1122  GLUCAP 93 167* 138* 120* 91   Lipid Profile: No results for input(s): CHOL, HDL, LDLCALC, TRIG, CHOLHDL, LDLDIRECT in the last 72 hours. Thyroid Function Tests: No results for input(s): TSH, T4TOTAL, FREET4, T3FREE, THYROIDAB in the last 72 hours. Anemia Panel: No results for input(s): VITAMINB12, FOLATE, FERRITIN, TIBC, IRON, RETICCTPCT in the last 72 hours. Sepsis Labs: Recent Labs  Lab 08/08/18 0404  PROCALCITON 0.12  LATICACIDVEN 1.5    No results found for this or any previous visit (from the  past 240 hour(s)).       Radiology Studies: Dg Abd Acute W/chest  Result Date: 08/10/2018 CLINICAL DATA:  Nausea, vomiting and diarrhea. EXAM: DG ABDOMEN ACUTE W/ 1V CHEST COMPARISON:  Chest and abdomen radiographs, 08/02/2018. FINDINGS: There is no bowel dilation to suggest obstruction. Scattered air-fluid levels are noted on the decubitus view. This suggests a mild adynamic ileus. There is no free air. Gastrostomy tube projects in the left quadrant in the expected location of the stomach. No evidence renal or ureteral stones. Two catheters project over the right abdomen stable prior exam. Cardiac silhouette is normal in size. No mediastinal masses. Minor left lung base opacity, improved compared to the prior chest radiographs. Residual opacity is likely atelectasis. Lungs otherwise clear. No acute skeletal abnormality. IMPRESSION: 1. No evidence of bowel obstruction or free air. 2. Scattered bowel air-fluid levels on the decubitus view suggests a mild  adynamic ileus or gastroenteritis. 3. Improved left lung base opacity. Residual opacity is likely atelectasis. Electronically Signed   By: [name] M.D.   On: 08/10/2018 12:43        Scheduled Meds: . chlorhexidine  15 mL Mouth Rinse BID  . enoxaparin (LOVENOX) injection  60 mg Subcutaneous Q12H  . feeding supplement (JEVITY 1.5 CAL/FIBER)  285 mL Per Tube QID  . free water  100 mL Per Tube Q6H  . gemfibrozil  600 mg Per Tube BID AC  . guaiFENesin-dextromethorphan  15 mL Per Tube BID  . insulin aspart  0-5 Units Subcutaneous QHS  . insulin aspart  0-9 Units Subcutaneous TID WC  . insulin detemir  8 Units Subcutaneous QHS  . ipratropium-albuterol  3 mL Nebulization BID  . lactulose  30 g Per Tube BID  . mouth rinse  15 mL Mouth Rinse q12n4p  . metoprolol tartrate  5 mg Intravenous Q6H  . sodium chloride HYPERTONIC  4 mL Nebulization BID   Continuous Infusions: . sodium chloride 75 mL/hr at 08/11/18 1033  . famotidine (PEPCID) IV    . lacosamide (VIMPAT) IV 200 mg (08/11/18 1038)  . levETIRAcetam 1,500 mg (08/11/18 1035)  . valproate sodium 500 mg (08/11/18 0536)     LOS: 29 days    Time spent: 35 minutes    Morgan Poisson, MD [facility] Pager [phone] If 7PM-7AM, please contact night-coverage [email or web address] Password [email or web address] 08/11/2018, 12:05 PM

## 2018-08-11 NOTE — Progress Notes (Signed)
Patient with g-tube placed 10/18 in IR now with ~48 hours of vomiting with feeds, medications and free water.  Per CT scan today g-tube looks to be in correct position, discussed with Dr. Pascal Lux who offered injection to further assess position/evaluate for gastric emptying. Discussed with Dr. Karleen Hampshire who requests injection in IR.  Will attempt to bring patient down for injection today.   Please call IR with questions or concerns.  Candiss Norse, PA-C

## 2018-08-12 ENCOUNTER — Inpatient Hospital Stay (HOSPITAL_COMMUNITY): Payer: Medicare Other

## 2018-08-12 ENCOUNTER — Encounter (HOSPITAL_COMMUNITY): Payer: Self-pay | Admitting: Interventional Radiology

## 2018-08-12 DIAGNOSIS — Z515 Encounter for palliative care: Secondary | ICD-10-CM

## 2018-08-12 DIAGNOSIS — R569 Unspecified convulsions: Secondary | ICD-10-CM

## 2018-08-12 DIAGNOSIS — R627 Adult failure to thrive: Secondary | ICD-10-CM

## 2018-08-12 HISTORY — PX: IR CM INJ ANY COLONIC TUBE W/FLUORO: IMG2336

## 2018-08-12 LAB — BASIC METABOLIC PANEL
Anion gap: 13 (ref 5–15)
BUN: <5 mg/dL — ABNORMAL LOW (ref 8–23)
CHLORIDE: 102 mmol/L (ref 98–111)
CO2: 22 mmol/L (ref 22–32)
CREATININE: 0.66 mg/dL (ref 0.44–1.00)
Calcium: 8.8 mg/dL — ABNORMAL LOW (ref 8.9–10.3)
GFR calc Af Amer: >60 mL/min (ref >60)
GFR calc non Af Amer: >60 mL/min (ref >60)
GLUCOSE: 106 mg/dL — AB (ref 70–99)
POTASSIUM: 3.2 mmol/L — AB (ref 3.5–5.1)
SODIUM: 137 mmol/L (ref 135–145)

## 2018-08-12 LAB — VALPROIC ACID LEVEL: Valproic Acid Lvl: 74 ug/mL (ref 50.0–100.0)

## 2018-08-12 LAB — GLUCOSE, CAPILLARY
GLUCOSE-CAPILLARY: 100 mg/dL — AB (ref 70–99)
Glucose-Capillary: 125 mg/dL — ABNORMAL HIGH (ref 70–99)
Glucose-Capillary: 128 mg/dL — ABNORMAL HIGH (ref 70–99)
Glucose-Capillary: 78 mg/dL (ref 70–99)

## 2018-08-12 LAB — AMMONIA: Ammonia: 20 umol/L (ref 9–35)

## 2018-08-12 MED ORDER — ACETAMINOPHEN 650 MG RE SUPP: 325.0000 mg | RECTAL | Status: DC | PRN

## 2018-08-12 MED ORDER — GLYCOPYRROLATE 1 MG PO TABS
2.0000 mg | ORAL_TABLET | Freq: Two times a day (BID) | ORAL | Status: DC
  Administered 2018-08-12 – 2018-08-14 (×4): 2 mg
  Filled 2018-08-12 (×7): qty 2

## 2018-08-12 MED ORDER — IOPAMIDOL (ISOVUE-300) INJECTION 61%
INTRAVENOUS | Status: AC
  Administered 2018-08-12: 50 mL
  Filled 2018-08-12: qty 50

## 2018-08-12 MED ORDER — PROMETHAZINE HCL 25 MG/ML IJ SOLN
12.5000 mg | Freq: Four times a day (QID) | INTRAMUSCULAR | Status: DC | PRN
  Administered 2018-08-12 – 2018-08-14 (×3): 12.5 mg via INTRAVENOUS
  Filled 2018-08-12 (×3): qty 1

## 2018-08-12 MED ORDER — LORAZEPAM 2 MG/ML PO CONC
2.0000 mg | ORAL | Status: DC | PRN
  Administered 2018-08-14: 2 mg via ORAL
  Filled 2018-08-12 (×2): qty 1

## 2018-08-12 MED ORDER — SODIUM CHLORIDE 0.9 % IV SOLN
2.0000 g | INTRAVENOUS | Status: DC
  Administered 2018-08-12 – 2018-08-13 (×2): 2 g via INTRAVENOUS
  Filled 2018-08-12 (×4): qty 20

## 2018-08-12 NOTE — Progress Notes (Signed)
RN called RT to room due to possible aspiration.  Pt resting comfortably on 5L [place] SPO2 97%.  NAD.  RT confirmed RN was calling MD.

## 2018-08-12 NOTE — Progress Notes (Signed)
Patient returned from IR and vomited twice, in transit and back in her room. Dr. Karleen Hampshire and Dr. Lorraine Lax notified of possible aspiration and seizure with O2 sats 86, her eyes rolling back in her head, and unresponsive, 4L [place] placed. Chest xray, ct head, and labs ordered.   14:15 Dr. Karleen Hampshire notified of loss in IV access IV team attempting sticks.

## 2018-08-12 NOTE — Procedures (Signed)
Date of recording10/25/2019  Referring physician Rory Percy  Reason for the study Altered mental status/seizure  Technical Digital EEG recording using 10-20 international electrode system  Description of the recording Posterior dominant rhythm is4-6Hz  symmetrical  EEG comprises generalized delta and theta slowing with reactivity Pulse artifact Epileptiform features were not seen during this recording  Impression The EEG isabnormal and findings are consistent with mild to moderate generalized cerbral dysfunction, epileptiform features were not seen during this recording

## 2018-08-12 NOTE — Progress Notes (Signed)
PROGRESS NOTE    [name]  ZOX:[id number] DOB: November 30, 1950 DOA: 07/13/2018 PCP: [name], MD  Brief Narrative:    67 year old F w/ a history of prior CVAs with carotid artery disease, hypertension, hyperlipidemia, normal pressure hydrocephalus status post shunt who was admitted with seizures and a new left MCA territory stroke.  Left ICA thrombus.Treated with heparin drip initially due to planned procedures and antiplatelets.   Had recurrent seizures and transferred to ICU 9/29 for Versed and seizure suppression. Intubated and ventilated 9/29.  After extubation, had persistent dysphagia status post cord track placement then later PEG tube placement by interventional radiology on 08/05/2018.  Hospital course complicated by lethargy.  Initially on Vimpat, Keppra, Depakote, and gabapentin.  Gabapentin stopped on 08/08/2018 as recommended by neurology due to persistent somnolence.  Repeated EEG done on 08/08/2018 with no recurrence of seizure.  Elevated ammonia level.  Started on lactulose.  No noted valproic acid toxicity.   08/10/2018 Pt seen and examined with her daughter at bedside. No vomiting last night or this am, as per the daughter pt had been very restless all night.  She is coughing up yellowish green sputum on my exam.     Assessment & Plan:   Active Problems:   Dysphagia   Acute CVA (cerebrovascular accident) ([place])   Type II diabetes mellitus with complication ([place])   Benign essential HTN   Hypokalemia   Encounter for intubation   CVA (cerebral vascular accident) ([place])   Diabetes mellitus ([place])   Status epilepticus ([place])   SOB (shortness of breath)   Advanced care planning/counseling discussion   Goals of care, counseling/discussion   Aspiration into airway   Adult failure to thrive   Left hemispheric acute CVA secondary to left ICA stenosis in ICA thrombus Plan to resume Eliquis when able to tolerate PEG feeds.    Acute metabolic encephalopathy  secondary to seizures versus hyper ammonemia.  Neurology reconsulted and recommended seizure medications with Keppra, valproate and Vimpat, changed to IV as she is unable to tolerate anything by PEG .  Stop gabapentin as per recommendations by neurology. Pt continues to be lethargic,but she has a strong cough reflex and coughing up yellowish green sputum.    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  Seizure precautions.  Type 2 diabetes CBG (last 3)  Recent Labs    08/12/18 0006 08/12/18 0420 08/12/18 0743  GLUCAP 125* 128* 100*    last A1c 6.3.  Try to avoid hypoglycemia.    Anemia of chronic disease No signs of overt bleeding Transfuse to keep hemoglobin greater than 7. Hemoglobin stable around 8.      Severe dysphagia status post PEG tube placement by IR on October 18. Currently tube feeds on hold  Due to vomiting.  No vomiting seen last night or this am, restart water boluses today and if able to tolerate, can start slow tube feeds with 60ml/hr.    Persistent nausea and vomiting Abdominal x-ray showed adynamic ileus versus gastroenteritis probably triggered by lactulose. Replete electrolytes.  CT abd and pelvis ordered for further evaluation, did not show any obstruction. Mild enlargement of the left rectus abdominus muscle just inferior to the gastrostomy tube favored to reflect small postprocedural hematoma related to recent placement. Gastrostomy tube appears in appropriate position. IR reconsulted to see if PEG tube position needs to be changed.     Acute kidney injury secondary to dehydration  resolving with IV hydration.   Chemical pneumonitis from  possible aspiration Continue with Mucinex. Patient remains afebrile at this time.but she is coughing up yellowish green sputum, would put her empirically on IV rocephin for possible aspiration pneumonia and monitor.    Hypokalemia: replaced.  Mag wnl.  Repeat this am.      DVT prophylaxis: Lovenox.  Code Status: DNR Family Communication: Discussed of treatment, care and plan with the daughter at bedside Disposition Plan: SNF on discharge  Consultants:   Neurology  IR  Procedures: PEG tube placement by IR on October 18. CT abd and pelvis.    Antimicrobials: None   Subjective: No vomiting last night, very restless as per the daughter at bedside, requesting ativan before going to bed.  She remains afebrile.  Objective: Vitals:   08/11/18 1638 08/11/18 1931 08/12/18 0851 08/12/18 1035  BP: 138/70   (!) 157/86  Pulse: (!) 102     Resp: 20     Temp: 98.7 F (37.1 C)     TempSrc: Axillary     SpO2: 98% 97% 98% 100%  Weight:      Height:        Intake/Output Summary (Last 24 hours) at 08/12/2018 1125 Last data filed at 08/11/2018 1726 Gross per 24 hour  Intake 1035.02 ml  Output -  Net 1035.02 ml   Filed Weights   08/07/18 0500 08/08/18 1020 08/10/18 0419  Weight: 59.3 kg 60.7 kg 60.5 kg    Examination:  General exam: lethargic, sickly appearing, coughing . Not in distress.  Respiratory system:  Coarse breath sounds bilateral. No wheezing heard. Air entry fair.  Cardiovascular system: S1 & S2 heard, RRR. No JVD No pedal edema. Gastrointestinal system: Abdomen is soft PEG in place, no distention, bowel sounds okay.  Central nervous system: lethargic, not coherent, able to move all extremities.  Extremities: no pedal edema, cyanosis or clubbing.  Skin: No rashes, lesions or ulcers Psychiatry: no agitation overnight.     Data Reviewed: I have personally reviewed following labs and imaging studies  CBC: Recent Labs  Lab 08/07/18 0447 08/08/18 0404 08/10/18 0535 08/11/18 1120  WBC 11.0* 12.2* 13.3* 13.6*  HGB 8.6* 9.2* 8.6* 8.7*  HCT 28.1* 29.6* 27.9* 28.7*  MCV 89.8 90.5 91.8 90.8  PLT 356 322 270 672   Basic Metabolic Panel: Recent Labs  Lab 08/06/18 1120 08/08/18 0404 08/10/18 0535 08/11/18 1120  NA 136  138 139 140  K 4.9 4.4 3.4* 3.4*  CL 102 106 106 105  CO2 21* 25 24 24   GLUCOSE 196* 138* 107* 113*  BUN 14 12 9  7*  CREATININE 0.87 0.74 0.72 0.76  CALCIUM 9.2 8.5* 8.7* 9.1  MG 1.9  --   --  1.7  PHOS 3.5  --   --   --    GFR: Estimated Creatinine Clearance: 59.3 mL/min (by C-G formula based on SCr of 0.76 mg/dL). Liver Function Tests: Recent Labs  Lab 08/11/18 1120  AST 17  ALT 11  ALKPHOS 91  BILITOT 0.4  PROT 6.3*  ALBUMIN 2.3*   Recent Labs  Lab 08/11/18 1120  LIPASE 24   Recent Labs  Lab 08/08/18 1226  AMMONIA 54*   Coagulation Profile: No results for input(s): INR, PROTIME in the last 168 hours. Cardiac Enzymes: No results for input(s): CKTOTAL, CKMB, CKMBINDEX, TROPONINI in the last 168 hours. BNP (last 3 results) No results for input(s): PROBNP in the last 8760 hours. HbA1C: No results for input(s): HGBA1C in the last 72 hours. CBG: Recent Labs  Lab 08/11/18 1637 08/11/18 2015 08/12/18 0006 08/12/18 0420 08/12/18 0743  GLUCAP 113* 129* 125* 128* 100*   Lipid Profile: No results for input(s): CHOL, HDL, LDLCALC, TRIG, CHOLHDL, LDLDIRECT in the last 72 hours. Thyroid Function Tests: No results for input(s): TSH, T4TOTAL, FREET4, T3FREE, THYROIDAB in the last 72 hours. Anemia Panel: No results for input(s): VITAMINB12, FOLATE, FERRITIN, TIBC, IRON, RETICCTPCT in the last 72 hours. Sepsis Labs: Recent Labs  Lab 08/08/18 0404  PROCALCITON 0.12  LATICACIDVEN 1.5    No results found for this or any previous visit (from the past 240 hour(s)).       Radiology Studies: Ct Abdomen Pelvis Wo Contrast  Result Date: 08/11/2018 CLINICAL DATA:  Persistent nausea and vomiting. EXAM: CT ABDOMEN AND PELVIS WITHOUT CONTRAST TECHNIQUE: Multidetector CT imaging of the abdomen and pelvis was performed following the standard protocol without IV contrast. COMPARISON:  CT abdomen pelvis dated Feb 26, 2016. FINDINGS: Lower chest: Left lower lobe atelectasis.  Hepatobiliary: No focal liver abnormality is seen. No gallstones, gallbladder wall thickening, or biliary dilatation. Pancreas: Unremarkable. No pancreatic ductal dilatation or surrounding inflammatory changes. Spleen: Normal in size without focal abnormality. Adrenals/Urinary Tract: Adrenal glands are unremarkable. Tiny bilateral punctate renal calculi. No hydronephrosis. Bladder is unremarkable. Stomach/Bowel: Small hiatal hernia. Gastrostomy tube in place. No bowel wall thickening, distention, or surrounding inflammatory changes. Moderate colonic diverticulosis. Small herniated portion of the anti mesenteric transverse colon at the umbilicus. The appendix is not visualized consistent with history of prior appendectomy. Vascular/Lymphatic: Aortic atherosclerosis. No enlarged abdominal or pelvic lymph nodes. Reproductive: Status post hysterectomy. No adnexal masses. Other: No free fluid or pneumoperitoneum. Unchanged lumbar thecal shunt to the abdominal cavity. Musculoskeletal: Mild enlargement of the left rectus abdominus muscle just inferior to the gastrostomy tube with overlying superficial soft tissue stranding. No acute or significant osseous findings. IMPRESSION: 1.  No acute intra-abdominal process.  No bowel obstruction. 2. Mild enlargement of the left rectus abdominus muscle just inferior to the gastrostomy tube favored to reflect small postprocedural hematoma related to recent placement. Gastrostomy tube appears in appropriate position. 3. Small umbilical hernia containing a portion of the anti mesenteric transverse colon. 4. Bilateral nonobstructive punctate nephrolithiasis. 5.  Aortic atherosclerosis (ICD10-I70.0). Electronically Signed   By: Titus Dubin M.D.   On: 08/11/2018 12:34        Scheduled Meds: . chlorhexidine  15 mL Mouth Rinse BID  . enoxaparin (LOVENOX) injection  60 mg Subcutaneous Q12H  . feeding supplement (JEVITY 1.5 CAL/FIBER)  285 mL Per Tube QID  . free water  100 mL  Per Tube Q6H  . gemfibrozil  600 mg Per Tube BID AC  . guaiFENesin-dextromethorphan  15 mL Per Tube BID  . insulin aspart  0-5 Units Subcutaneous QHS  . insulin aspart  0-9 Units Subcutaneous TID WC  . insulin detemir  8 Units Subcutaneous QHS  . iopamidol      . ipratropium-albuterol  3 mL Nebulization BID  . lactulose  30 g Per Tube BID  . mouth rinse  15 mL Mouth Rinse q12n4p  . metoprolol tartrate  5 mg Intravenous Q6H  . sodium chloride HYPERTONIC  4 mL Nebulization BID   Continuous Infusions: . sodium chloride 75 mL/hr at 08/11/18 1033  . cefTRIAXone (ROCEPHIN)  IV    . famotidine (PEPCID) IV 20 mg (08/11/18 1239)  . lacosamide (VIMPAT) IV 200 mg (08/12/18 1033)  . levETIRAcetam 1,500 mg (08/11/18 2132)  . valproate sodium 500 mg (08/12/18 0544)  LOS: 30 days    Time spent: 35 minutes    Hosie Poisson, MD [facility] Pager [phone] If 7PM-7AM, please contact night-coverage [email or web address] Password [email or web address] 08/12/2018, 11:25 AM

## 2018-08-12 NOTE — Care Management Note (Signed)
Case Management Note  Patient Details  Name: [name] MRN: [id number] Date of Birth: 29-Dec-1950  Subjective/Objective:                    Action/Plan: Plan is for SNF when patient is medically ready. CM following.  Expected Discharge Date:                  Expected Discharge Plan:  Skilled Nursing Facility  In-House Referral:  Clinical Social Work  Discharge planning Services  CM Consult  Post Acute Care Choice:    Choice offered to:     DME Arranged:    DME Agency:     HH Arranged:    [place] Agency:     Status of Service:  In process, will continue to follow  If discussed at Long Length of Stay Meetings, dates discussed:    Additional Comments:  Pollie Friar, RN 08/12/2018, 11:32 AM

## 2018-08-12 NOTE — Progress Notes (Signed)
Notified DR. Akula of patients spike in temp and continual emesis. Orders for tylenol per tube and phenergan given.

## 2018-08-12 NOTE — Progress Notes (Addendum)
CSW following for discharge plan. Patient still not medically stable at this time for transfer to SNF. CSW alerted Admissions at [facility], they will continue to follow for possible SNF placement, if patient is medically ready. They will be unable to take the patient until Monday.  Per chart review, noting continued discussions with palliative, and that family may be agreeable to hospice. CSW to continue to follow for family decision on goals of care.  Laveda Abbe, [place] Clinical Social Worker [phone]

## 2018-08-12 NOTE — Progress Notes (Signed)
PT Cancellation Note  Patient Details Name: [name] MRN: [id number] DOB: Jan 03, 1951   Cancelled Treatment:    Reason Eval/Treat Not Completed: (P) Patient's level of consciousness;Other (comment);Medical issues which prohibited therapy(Pt continues to actively vomit, unresponsive to vocal commands.  Will defer PT at this time.  )   Cristela Blue 08/12/2018, 11:59 AM  Governor Rooks, PTA Acute Rehabilitation Services Pager [phone] Office [phone]

## 2018-08-12 NOTE — Procedures (Signed)
Pre procedural Dx: Dysphagia Post procedural Dx: Same  Fluoroscopic guided repositioning of disc retention gastrostomy tube with retention disc now located within the more central aspect of the gastric lumen.   Successful bedside expression peri-gastrostomy track abscess/hematoma.  EBL: None  Complications: None immediate.  Ronny Bacon, MD Pager #: [phone]

## 2018-08-12 NOTE — Progress Notes (Signed)
   08/12/18 0957  Clinical Encounter Type  Visited With Patient and family together  Visit Type Initial  Referral From Palliative care team  Consult/Referral To Chaplain  Spiritual Encounters  Spiritual Needs Prayer;Emotional  The chaplain followed up on spiritual care consult from PMT.  The chaplain was welcomed into the room by the Pt. Daughter-Rebecca. The Pt. was sleeping at time of visit. [name] shared her story of recovery from knee surgery while balancing the physical and emotional challenges of being present with her mom.  The Pt.'s daughter began talking about significant events that pointed to her mother's movement and the daughter's acceptance of her mother's declining health and the Pt.'s decision to be DNR.  During the same visit the daughter shared her father's search for a suitable rehab facility for the Pt.  The Pt.daughter remained teary eyed during the time with the chaplain.  The Pt. daughter accepted the invitation from the chaplain to continue a spiritual care relationship. The daughter and chaplain prayed together before leaving the Pt. Room.

## 2018-08-12 NOTE — Progress Notes (Addendum)
Daily Progress Note   Patient Name: [name]       Date: 08/12/2018 DOB: 1951-02-07  Age: 67 y.o. MRN#: [id number] Attending Physician: Hosie Poisson, MD Primary Care Physician: [name], MD Admit Date: 07/13/2018  Reason for Consultation/Follow-up: Establishing goals of care, Non pain symptom management and Psychosocial/spiritual support  Subjective: Spoke with family at bedside.  Patient has not been responsive to them for 3 days.  Husband reports patient's eyes are just rolled up in her head.  Dtr mentions that she has been suctioning patient frequently and she is concerned she has aspirated.  We talked about where the patient is in the "bigger picture" of things.  Both dtr and husband were tearful.  They do not want her to suffer, but they do not want to let her go.  We discussed a shift to comfort measures and consideration of hospice house.  Husband and daughter seemed agreeable to shifting to comfort but wanted to wait for test results - we reviewed (1) IR results from this morning - peri-gastrostomy abscess was expressed; (2) my novice read of the chest xray did not see anything new and different in comparison to 10/15; (3) EEG is being performed now.  Family asked to wait for EEG result before making final decision for comfort.    Patient lost IV access and was having difficulty replacing IV.  IV team has been called.  In the mean time I spoke with Dr. Karleen Hampshire (Attending) who gave permission to use PEG for sips and meds.    There is concern that vomiting may be from a central source.  Assessment: Not responsive to family x 3 days.  Long illness.  Recurrent seizures.  Now unable to control secretions and is aspirating.   Patient Profile/HPI:  67 y.o. female  with past  medical history of DM2, previous CVAs (s/p trach and PEG since removed), anemia, HTN, seizures, psuedotumor cerebri, GERD  admitted on 07/13/2018 with change in mental status MRI and CT confirm new CVA. During admission she experienced severe seizures requiring voluntary intubation and sedation for control. She has been extubated and is awake. Has ongoing dysphagia and continues to have R sided twitching. Much more alert yesterday and today than previously during admission. Participating some with PT and SLP. High risk for aspiration. Has coretrak placed.  Palliative medicine consulted for [place] discussion.   Length of Stay: 30  Current Medications: Scheduled Meds:  . chlorhexidine  15 mL Mouth Rinse BID  . enoxaparin (LOVENOX) injection  60 mg Subcutaneous Q12H  . feeding supplement (JEVITY 1.5 CAL/FIBER)  285 mL Per Tube QID  . free water  100 mL Per Tube Q6H  . gemfibrozil  600 mg Per Tube BID AC  . guaiFENesin-dextromethorphan  15 mL Per Tube BID  . insulin aspart  0-5 Units Subcutaneous QHS  . insulin aspart  0-9 Units Subcutaneous TID WC  . insulin detemir  8 Units Subcutaneous QHS  . ipratropium-albuterol  3 mL Nebulization BID  . lactulose  30 g Per Tube BID  . mouth rinse  15 mL Mouth Rinse q12n4p  . metoprolol tartrate  5 mg Intravenous Q6H  . sodium chloride HYPERTONIC  4 mL Nebulization BID    Continuous Infusions: . sodium chloride 75 mL/hr at 08/11/18 1033  . cefTRIAXone (ROCEPHIN)  IV    . famotidine (PEPCID) IV 20 mg (08/12/18 1230)  . lacosamide (VIMPAT) IV 200 mg (08/12/18 1033)  . levETIRAcetam 1,500 mg (08/12/18 1200)  . valproate sodium 500 mg (08/12/18 0544)    PRN Meds: [DISCONTINUED] acetaminophen **OR** acetaminophen (TYLENOL) oral liquid 160 mg/5 mL **OR** [DISCONTINUED] acetaminophen, labetalol, LORazepam, ondansetron (ZOFRAN) IV, RESOURCE THICKENUP CLEAR  Physical Exam        Elderly ill appearing female,  Lying in bed, eyes closed, head up, neck stiff,   Non-responsive. cv rrr resp lungs sound wet.  Throat sounds are wet Abdomen bandage over PEG side is pink with serous fluid, bruised, erythematous area noted under PEG bumper Husband and daughter both at bedside and tearful.  Vital Signs: BP (!) 100/54 (BP Location: Right Arm)   Pulse 95   Temp 98.2 F (36.8 C) (Axillary)   Resp 15   Ht 5\' 2"  (1.575 m)   Wt 60.5 kg   SpO2 99%   BMI 24.40 kg/m  SpO2: SpO2: 99 % O2 Device: O2 Device: Nasal Cannula O2 Flow Rate: O2 Flow Rate (L/min): 4 L/min  Intake/output summary:   Intake/Output Summary (Last 24 hours) at 08/12/2018 1451 Last data filed at 08/11/2018 1726 Gross per 24 hour  Intake 1035.02 ml  Output -  Net 1035.02 ml   LBM: Last BM Date: 08/10/18 Baseline Weight: Weight: 59 kg Most recent weight: Weight: 60.5 kg       Palliative Assessment/Data: 10%      Patient Active Problem List   Diagnosis Date Noted  . Adult failure to thrive   . Aspiration into airway   . Advanced care planning/counseling discussion   . Goals of care, counseling/discussion   . Status epilepticus ([place])   . SOB (shortness of breath)   . Adjustment disorder with depressed mood 11/02/2017  . Adjustment disorder with mixed anxiety and depressed mood   . Hemoptysis   . Hematemesis   . Intractable vomiting with nausea   . Acute lower UTI   . Urinary retention   . Diabetic gastroparesis ([place])   . Vascular headache   . Diabetes mellitus ([place])   . CVA (cerebral vascular accident) ([place]) 06/18/2017  . Weakness   . Cerebral infarction due to embolism of left carotid artery ([place])   . Right hemiparesis ([place])   . Encounter for intubation 06/16/2017  . Palliative care by specialist 06/16/2017  . Anxiety state   . Acute embolic stroke ([place])   . Benign essential HTN   .  Diabetic peripheral neuropathy ([place])   . Pseudotumor cerebri   . Hypokalemia   . Seizure ([place])   . Leukocytosis   . Acute blood loss anemia   . Post-operative pain   .  Encounter for feeding tube placement   . Respiratory failure ([place])   . Status post tracheostomy ([place])   . Acute respiratory failure with hypoxemia ([place])   . Stenosis of left carotid artery   . Acute CVA (cerebrovascular accident) ([place]) 05/31/2017  . Type II diabetes mellitus with complication ([place]) 00/86/7619  . Hypertension 05/31/2017  . Hyponatremia 05/31/2017  . IIH (idiopathic intracranial hypertension) 01/18/2016  . Malfunction of ventriculo-peritoneal shunt ([place]) 01/18/2016  . 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 Worsening headaches 01/18/2016  . Increased abdominal girth 01/18/2016  . Abdominal fluid collection 01/18/2016  . Vision changes 01/18/2016  . Perceived hearing changes 01/18/2016  . Nausea without vomiting 01/18/2016  . Peripheral vision loss 01/18/2016  . Imbalance 01/18/2016  . Falls 01/18/2016  . Dysphagia 01/18/2016  . Subacute confusional state 01/18/2016  . Left breast mass 05/05/2011    Palliative Care Plan    Recommendations/Plan:  Trial meds and water thru PEG.  If no vomiting or increased residuals gradually advance use.  Added ativan solution via PEG if patient has no IV access and seizes.  Will start robinul per PEG as I'm concerned IV access will not be available.  Avoid central line  PMT will follow up tomorrow to check status and consider shifting to comfort.  Goals of Care and Additional Recommendations:  Limitations on Scope of Treatment: Full Scope Treatment  Code Status:  DNR  Prognosis:   < 2 weeks - seizing on seizure medications.  Essentially non-responsive, aspirating.  Discharge Planning:  To Be [place] vs Hospital Death.  Family still in process of making decision to shift to comfort.  Care plan was discussed with family, attending, RN  Thank you for allowing the Palliative Medicine Team to assist in the care of this patient.  Total time spent:  60 min.     Greater than 50%  of this time was spent  counseling and coordinating care related to the above assessment and plan.  Florentina Jenny, PA-C Palliative Medicine  Please contact Palliative MedicineTeam phone at [phone] for questions and concerns between 7 am - 7 pm.   Please see AMION for individual provider pager numbers.

## 2018-08-12 NOTE — Progress Notes (Signed)
Reason for consult:   Subjective: Patient has been lethargic last 2-3 days with multiple episodes of viomiting, Tube feeds held for 2 days. Underwent PEG tube track abscess expression today.    ROS:  Unable to obtain due to poor mental status  Examination  Vital signs in last 24 hours: Temp:  [98.2 F (36.8 C)-101.4 F (38.6 C)] 101.4 F (38.6 C) (10/25 1636) Pulse Rate:  [95] 95 (10/25 1228) Resp:  [15] 15 (10/25 1228) BP: (100-157)/(54-86) 134/70 (10/25 1435) SpO2:  [96 %-100 %] 96 % (10/25 1435)  General: lying in bed, appears uncomfortable CVS: pulse-normal rate and rhythm RS: breathing comfortably Extremities: normal   Neuro: MS: Lethargic, nonverbal and not following commands CN: pupils 70mm bilaterally, reactive, no forced gaze deviation, no nystagmus  Motor: Withdraws in all 4 extremities  Basic Metabolic Panel: Recent Labs  Lab 08/06/18 1120 08/08/18 0404 08/10/18 0535 08/11/18 1120 08/12/18 1204  NA 136 138 139 140 137  K 4.9 4.4 3.4* 3.4* 3.2*  CL 102 106 106 105 102  CO2 21* 25 24 24 22   GLUCOSE 196* 138* 107* 113* 106*  BUN 14 12 9  7* <5*  CREATININE 0.87 0.74 0.72 0.76 0.66  CALCIUM 9.2 8.5* 8.7* 9.1 8.8*  MG 1.9  --   --  1.7  --   PHOS 3.5  --   --   --   --     CBC: Recent Labs  Lab 08/07/18 0447 08/08/18 0404 08/10/18 0535 08/11/18 1120  WBC 11.0* 12.2* 13.3* 13.6*  HGB 8.6* 9.2* 8.6* 8.7*  HCT 28.1* 29.6* 27.9* 28.7*  MCV 89.8 90.5 91.8 90.8  PLT 356 322 270 298     Coagulation Studies: No results for input(s): LABPROT, INR in the last 72 hours.  Imaging Reviewed:     ASSESSMENT AND PLAN  45 Y F with embolic watershed stroke with partial status epilepticus. Has been lethargic over past week - improved slightly after stopping Neurontin. Now has been vomiting x 3 days.   Repeat CT head negative for acute abnormality Repeat EEG official report pending, obscured my muscle artifact Ammonia level pending, last level was  54 VPA level yesterday was 81.   Plan F/U Ammonia level -level is 20. Continue to treat infection, metabolic disturbances   Karena Addison Farin Buhman [facility] Pager Number [phone] For questions after 7pm please refer to AMION to reach the Neurologist on call

## 2018-08-13 DIAGNOSIS — R1115 Cyclical vomiting syndrome unrelated to migraine: Secondary | ICD-10-CM

## 2018-08-13 LAB — GLUCOSE, CAPILLARY
GLUCOSE-CAPILLARY: 54 mg/dL — AB (ref 70–99)
GLUCOSE-CAPILLARY: 90 mg/dL (ref 70–99)
GLUCOSE-CAPILLARY: 91 mg/dL (ref 70–99)
Glucose-Capillary: 87 mg/dL (ref 70–99)

## 2018-08-13 MED ORDER — DEXTROSE-NACL 5-0.9 % IV SOLN
INTRAVENOUS | Status: DC
  Administered 2018-08-13: 18:00:00 via INTRAVENOUS

## 2018-08-13 MED ORDER — DEXTROSE 50 % IV SOLN
INTRAVENOUS | Status: AC
  Administered 2018-08-13: 50 mL
  Filled 2018-08-13: qty 50

## 2018-08-13 MED ORDER — APIXABAN 5 MG PO TABS
5.0000 mg | ORAL_TABLET | Freq: Two times a day (BID) | ORAL | Status: DC
  Administered 2018-08-13 – 2018-08-14 (×2): 5 mg
  Filled 2018-08-13 (×2): qty 1

## 2018-08-13 NOTE — Progress Notes (Signed)
   08/13/18 1500  Clinical Encounter Type  Visited With Patient and family together  Visit Type Initial;Critical Care;Other (Comment);Psychological support;Spiritual support;Social support (EOL)  Referral From Family  Consult/Referral To Faith community  Spiritual Encounters  Spiritual Needs Ritual;Emotional  Stress Factors  Patient Stress Factors Health changes  Family Stress Factors Major life changes   Pg from and called bk RN Angela at 11:01am, let know unable to come immediately.  Family req for "last rites."  Per RN, need not emergent.    Visited with pt's hus and dau, [name], at bedside at 2:53p.  Clarified that pt was [name] but did not have a parish.  Supportive listening about pt's medical journey, husband's courtship w/ wife.  Daughter said she had been here practically 24/7 since pt admitted nearly a month ago.  Daughter and husband noted that other family members had been up to say goodbyes, including son and his children.  Made calls to [facility] and spoke w/ Father Bill at 3:35p, he will do his best to come, has mass starting at 4:30pm.  Called RN Levada Dy to update and she will convey to family.  Myra Gianotti resident, [phone]

## 2018-08-13 NOTE — Progress Notes (Signed)
PROGRESS NOTE    [name]  VEL:[id number] DOB: [birth year] DOA: 07/13/2018 PCP: [name], MD  Brief Narrative:    67 year old F w/ a history of prior CVAs with carotid artery disease, hypertension, hyperlipidemia, normal pressure hydrocephalus status post shunt who was admitted with seizures and a new left MCA territory stroke.  Left ICA thrombus.Treated with heparin drip initially due to planned procedures and antiplatelets.   Had recurrent seizures and transferred to ICU 9/29 for Versed and seizure suppression. Intubated and ventilated 9/29.  After extubation, had persistent dysphagia status post cord track placement then later PEG tube placement by interventional radiology on 08/05/2018.  Hospital course complicated by lethargy.  Initially on Vimpat, Keppra, Depakote, and gabapentin.  Gabapentin stopped on 08/08/2018 as recommended by neurology due to persistent somnolence.  Repeated EEG done on 08/08/2018 with no recurrence of seizure.  Elevated ammonia level.  Started on lactulose.  No noted valproic acid toxicity. On 10/25 she underwent Successful bedside expression peri-gastrostomy track abscess/hematoma. On 10/26 and seen and examined with daughter at bedside , patient is a little bit alert this morning and does not seem to be vomiting or in distress today.  We will start with water boluses through the PEG tube today.    Assessment & Plan:   Active Problems:   Dysphagia   Acute CVA (cerebrovascular accident) ([place])   Type II diabetes mellitus with complication ([place])   Benign essential HTN   Hypokalemia   Encounter for intubation   CVA (cerebral vascular accident) ([place])   Diabetes mellitus ([place])   Status epilepticus ([place])   SOB (shortness of breath)   Advanced care planning/counseling discussion   Goals of care, counseling/discussion   Aspiration into airway   Adult failure to thrive   Palliative care encounter   Left hemispheric acute CVA secondary to  left ICA stenosis in ICA thrombus Plan to resume Eliquis when able to tolerate PEG feeds.  Patient is able to tolerate water boluses through the PEG tube at this time.   Acute metabolic encephalopathy secondary to seizures versus hyper ammonemia.  Neurology reconsulted and recommended seizure medications with Keppra, valproate and Vimpat, changed to IV as she is unable to tolerate anything by PEG .  Stop gabapentin as per recommendations by neurology. Is a little bit more alert today and is not moaning.     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  Seizure precautions.  Repeat EEG shows moderate cerebral dysfunction but no epileptiform activity.  Ammonia level is 20 , TSH is within normal limits , vitamin B12 within normal limits.  Type 2 diabetes CBG (last 3)  Recent Labs    08/12/18 0743 08/12/18 1635 08/13/18 0810  GLUCAP 100* 78 87    last A1c 6.3.  Try to avoid hypoglycemia.  Continue with sliding scale insulin    Anemia of chronic disease No signs of overt bleeding Transfuse to keep hemoglobin greater than 7. Hemoglobin stable around 8.    Severe dysphagia status post PEG tube placement by IR on October 18. Currently tube feeds on hold  Due to vomiting.  No vomiting seen last night or this am, restart water boluses today and if able to tolerate we will start tube feeds from tomorrow at 10 mL/h.  Persistent nausea and vomiting appears to have resolved Abdominal x-ray showed adynamic ileus versus gastroenteritis.  It was followed by CT abd and pelvis ordered for further evaluation, did not show any obstruction. Mild enlargement  of the left rectus abdominus muscle just inferior to the gastrostomy tube favored to reflect small postprocedural hematoma related to recent placement. Gastrostomy tube appears in appropriate position. IR reconsulted for repositioning of the disc and underwent successful bedside expression of  peri-gastrostomy tract abscess/hematoma.  She was also started on IV Rocephin.    Acute kidney injury secondary to dehydration  Resolved with IV hydration.    pneumonitis from possible aspiration Patient remains afebrile at this time.but she is coughing up yellowish green sputum, would put her empirically on IV rocephin for possible aspiration pneumonia and monitor.  Chest x-ray does not show any overt pneumonia.   Hypokalemia: replaced.  Mag wnl.  Repeat this am.     DVT prophylaxis: Lovenox.  Code Status: DNR Family Communication: Discussed of treatment, care and plan with the daughter at bedside Disposition Plan: SNF on discharge  Consultants:   Neurology  IR  Procedures: PEG tube placement by IR on October 18. CT abd and pelvis.    Antimicrobials: Rocephin   Subjective: No vomiting last night or this morning.  She is little bit more alert and comfortable this morning. Objective: Vitals:   08/13/18 0359 08/13/18 0743 08/13/18 0744 08/13/18 0815  BP: 109/80     Pulse: 92     Resp: (!) 100   (!) 81  Temp: 98.2 F (36.8 C)   98 F (36.7 C)  TempSrc: Oral   Axillary  SpO2:  100% 100%   Weight: 59.8 kg     Height:        Intake/Output Summary (Last 24 hours) at 08/13/2018 1229 Last data filed at 08/12/2018 1800 Gross per 24 hour  Intake 150 ml  Output -  Net 150 ml   Filed Weights   08/08/18 1020 08/10/18 0419 08/13/18 0359  Weight: 60.7 kg 60.5 kg 59.8 kg    Examination:  General exam: Alert opening eyes not in any kind of distress. Respiratory system: Diminished air entry at bases, no wheezing or rhonchi. Cardiovascular system: S1 & S2 heard, RRR. No JVD No pedal edema. Gastrointestinal system: Abdomen is soft, nondistended, nontender, PEG in place without any pus or serous drainage. Central nervous system: Alert, opening eyes on verbal cues able to move her extremities. Extremities: no pedal edema, cyanosis or clubbing.  Skin: No rashes,  lesions or ulcers Psychiatry: no agitation overnight.     Data Reviewed: I have personally reviewed following labs and imaging studies  CBC: Recent Labs  Lab 08/07/18 0447 08/08/18 0404 08/10/18 0535 08/11/18 1120  WBC 11.0* 12.2* 13.3* 13.6*  HGB 8.6* 9.2* 8.6* 8.7*  HCT 28.1* 29.6* 27.9* 28.7*  MCV 89.8 90.5 91.8 90.8  PLT 356 322 270 259   Basic Metabolic Panel: Recent Labs  Lab 08/08/18 0404 08/10/18 0535 08/11/18 1120 08/12/18 1204  NA 138 139 140 137  K 4.4 3.4* 3.4* 3.2*  CL 106 106 105 102  CO2 25 24 24 22   GLUCOSE 138* 107* 113* 106*  BUN 12 9 7* <5*  CREATININE 0.74 0.72 0.76 0.66  CALCIUM 8.5* 8.7* 9.1 8.8*  MG  --   --  1.7  --    GFR: Estimated Creatinine Clearance: 54.7 mL/min (by C-G formula based on SCr of 0.66 mg/dL). Liver Function Tests: Recent Labs  Lab 08/11/18 1120  AST 17  ALT 11  ALKPHOS 91  BILITOT 0.4  PROT 6.3*  ALBUMIN 2.3*   Recent Labs  Lab 08/11/18 1120  LIPASE 24   Recent Labs  Lab 08/08/18 1226 08/12/18 1914  AMMONIA 54* 20   Coagulation Profile: No results for input(s): INR, PROTIME in the last 168 hours. Cardiac Enzymes: No results for input(s): CKTOTAL, CKMB, CKMBINDEX, TROPONINI in the last 168 hours. BNP (last 3 results) No results for input(s): PROBNP in the last 8760 hours. HbA1C: No results for input(s): HGBA1C in the last 72 hours. CBG: Recent Labs  Lab 08/12/18 0006 08/12/18 0420 08/12/18 0743 08/12/18 1635 08/13/18 0810  GLUCAP 125* 128* 100* 78 87   Lipid Profile: No results for input(s): CHOL, HDL, LDLCALC, TRIG, CHOLHDL, LDLDIRECT in the last 72 hours. Thyroid Function Tests: No results for input(s): TSH, T4TOTAL, FREET4, T3FREE, THYROIDAB in the last 72 hours. Anemia Panel: No results for input(s): VITAMINB12, FOLATE, FERRITIN, TIBC, IRON, RETICCTPCT in the last 72 hours. Sepsis Labs: Recent Labs  Lab 08/08/18 0404  PROCALCITON 0.12  LATICACIDVEN 1.5    No results found for  this or any previous visit (from the past 240 hour(s)).       Radiology Studies: Ct Head Wo Contrast  Result Date: 08/12/2018 CLINICAL DATA:  Encephalopathy. Seizures. Hypertension. Vomiting with increased lethargy and headache. EXAM: CT HEAD WITHOUT CONTRAST TECHNIQUE: Contiguous axial images were obtained from the base of the skull through the vertex without intravenous contrast. COMPARISON:  Brain MRI 07/23/2018 FINDINGS: Brain: Confluent hypodensities involving cortex and white matter in the left parietal and posterior left frontal lobes correspond to infarcts of varying age as shown on prior MRI (now subacute and chronic). Small chronic right frontal and parietal infarcts are again noted. No definite acute infarct, intracranial hemorrhage, mass, midline shift, or extra-axial fluid collection is identified. There is a background of mild cerebral atrophy and moderate chronic small vessel ischemic disease. Chronic lacunar infarcts are noted in the basal ganglia bilaterally. There is a partially empty sella. Vascular: Calcified atherosclerosis at the skull base. No hyperdense vessel. Skull: No fracture or focal osseous lesion. Sinuses/Orbits: Paranasal sinuses and mastoid air cells are clear. Bilateral cataract extraction. Other: None. IMPRESSION: 1. No evidence of acute intracranial abnormality. 2. Subacute and chronic ischemia as above. Electronically Signed   By: Logan Bores M.D.   On: 08/12/2018 14:12   Ir Cm Inj Any Colonic Tube W/fluoro  Result Date: 08/12/2018 INDICATION: Nausea and vomiting associated with recent gastrostomy tube feedings. Pull through gastrostomy was placed on 08/05/2018 (Dr.  Barbie Banner). Patient presents now for fluoroscopic guided injection and evaluation. EXAM: FLUOROSCOPIC GUIDED REPLACEMENT OF GASTROSTOMY TUBE COMPARISON:  CT abdomen and pelvis-08/11/2018; pull-through gastrostomy tube placement - 08/05/2018 MEDICATIONS: None. CONTRAST:  50 cc Isovue-300 - administered into  the gastric lumen FLUOROSCOPY TIME:  48 seconds (10 mGy) COMPLICATIONS: None immediate. PROCEDURE: Patient was placed supine on the fluoroscopy table. Visual expect shin of the gastrostomy tube demonstrates the external bumper was very tight against the skin surface. Additionally, there was confirmed this eroded the exit site of the gastrostomy tube which was apparently tender to palpation (patient is largely nonverbal). Contrast injection demonstrates appropriate positioning of the gastrostomy tube with brisk opacification of the gastric lumen. The external disc was loosened within the subsequent brisk expression of a large amount of purulent foul-smelling material. As much foul smelling fluid as possible was attempted to be expressed from the gastrostomy track with manual compression. Next, the gastrostomy tube was further advanced into the more central aspect of the gastric lumen and the external disc was secured at the skin just peripheral to the skin entrance site (at the approximately 8  cm mark of the external portion of the gastrostomy tube) with an interrupted 0 silk suture. Again, small amount of contrast was injected confirming appropriate positioning and without evidence of leak. Several spot fluoroscopic images were obtained in various obliquities to confirm appropriate intraluminal positioning. A dressing was placed. The patient tolerated the procedure well without immediate postprocedural complication. IMPRESSION: 1. Fluoroscopic guided repositioning of disc retention gastrostomy tube with retention disc now located within the more central aspect of the gastric lumen. 2. Successful bedside expression peri-gastrostomy track abscess/hematoma. Electronically Signed   By: Sandi Mariscal M.D.   On: 08/12/2018 13:16   Dg Chest Port 1 View  Result Date: 08/12/2018 CLINICAL DATA:  67 y/o  F; possible aspiration. EXAM: PORTABLE CHEST 1 VIEW COMPARISON:  08/10/2018 chest radiograph. FINDINGS: Stable cardiac  silhouette given projection and technique. Streaky opacities at the lung bases may reflect bronchitic changes and/or aspiration. No pleural effusion or pneumothorax. Surgical drain projects over the right upper quadrant. No acute osseous abnormality is evident. IMPRESSION: Streaky opacities at the lung bases may reflect bronchitic changes and/or aspiration. Electronically Signed   By: Kristine Garbe M.D.   On: 08/12/2018 14:59        Scheduled Meds: . chlorhexidine  15 mL Mouth Rinse BID  . enoxaparin (LOVENOX) injection  60 mg Subcutaneous Q12H  . feeding supplement (JEVITY 1.5 CAL/FIBER)  285 mL Per Tube QID  . free water  100 mL Per Tube Q6H  . gemfibrozil  600 mg Per Tube BID AC  . glycopyrrolate  2 mg Per Tube BID  . guaiFENesin-dextromethorphan  15 mL Per Tube BID  . insulin aspart  0-5 Units Subcutaneous QHS  . insulin aspart  0-9 Units Subcutaneous TID WC  . insulin detemir  8 Units Subcutaneous QHS  . ipratropium-albuterol  3 mL Nebulization BID  . lactulose  30 g Per Tube BID  . mouth rinse  15 mL Mouth Rinse q12n4p  . metoprolol tartrate  5 mg Intravenous Q6H  . sodium chloride HYPERTONIC  4 mL Nebulization BID   Continuous Infusions: . sodium chloride 75 mL/hr at 08/11/18 1033  . cefTRIAXone (ROCEPHIN)  IV 2 g (08/12/18 1724)  . famotidine (PEPCID) IV 20 mg (08/13/18 1049)  . lacosamide (VIMPAT) IV 200 mg (08/13/18 1017)  . levETIRAcetam 1,500 mg (08/13/18 1122)  . valproate sodium 500 mg (08/13/18 1214)     LOS: 31 days    Time spent: 35 minutes    Hosie Poisson, MD [facility] Pager [phone] If 7PM-7AM, please contact night-coverage [email or web address] Password [facility] 08/13/2018, 12:29 PM

## 2018-08-13 NOTE — Progress Notes (Addendum)
Hypoglycemic Event  CBG: 57 at 1130  Treatment: D50 IV 50 mL  Symptoms: None  Follow-up CBG: Time:1200 CBG Result:130  Possible Reasons for Event: Inadequate meal intake  Comments/MD notified:    Etta Quill

## 2018-08-13 NOTE — Progress Notes (Addendum)
   Subjective: No significant improvement compared to yesterday.  No longer having recurrent vomiting.  After giving sternal rub patient will awaken state her name, goes back to sleep.  She has been started on antibiotics.  Tolerating water through tube feeds. Palliative consult yesterday.  ROS:  Unable to obtain due to poor mental status  Examination  Vital signs in last 24 hours: Temp:  [97.6 F (36.4 C)-101.4 F (38.6 C)] 98 F (36.7 C) (10/26 0815) Pulse Rate:  [92-109] 92 (10/26 0359) Resp:  [14-100] 81 (10/26 0815) BP: (109-147)/(70-82) 109/80 (10/26 0359) SpO2:  [96 %-100 %] 100 % (10/26 0744) FiO2 (%):  [28 %] 28 % (10/26 0744) Weight:  [59.8 kg] 59.8 kg (10/26 0359)  General: lying in bed, appears in mild discomfort CVS: pulse-normal rate and rhythm RS: breathing comfortably Extremities: normal   Neuro: Patient is lethargic, but arousable to sternal rub.  Stated her name and daughter's name.  Speech is very slurred. No forced gaze deviation noted.  No tremors,/jerking of right upper extremity.  Basic Metabolic Panel: Recent Labs  Lab 08/08/18 0404 08/10/18 0535 08/11/18 1120 08/12/18 1204  NA 138 139 140 137  K 4.4 3.4* 3.4* 3.2*  CL 106 106 105 102  CO2 25 24 24 22   GLUCOSE 138* 107* 113* 106*  BUN 12 9 7* <5*  CREATININE 0.74 0.72 0.76 0.66  CALCIUM 8.5* 8.7* 9.1 8.8*  MG  --   --  1.7  --     CBC: Recent Labs  Lab 08/07/18 0447 08/08/18 0404 08/10/18 0535 08/11/18 1120  WBC 11.0* 12.2* 13.3* 13.6*  HGB 8.6* 9.2* 8.6* 8.7*  HCT 28.1* 29.6* 27.9* 28.7*  MCV 89.8 90.5 91.8 90.8  PLT 356 322 270 298     Coagulation Studies: No results for input(s): LABPROT, INR in the last 72 hours.     ASSESSMENT AND PLAN  Partial status epilepticus-resolved Left MCA watershed infarcts Persistent encephalopathy -likely multifactorial including PEG site infection, possible aspiration, multiple medications.    Ammonia level checked yesterday was  normal at 20. Valproic acid level normal 74. CT head showed no acute changes.  Routine EEG showed persistent encephalopathy, no epileptiform discharges were seen.  Plan: Currently on Keppra 1.5 g twice daily and Vimpat 200 mg twice daily.  Also on Depakote 500 mg 3 times daily.  Recently taken off Neurontin.  I do not think her persistent encephalopathy can be explained by her antiepileptic drugs alone, as patient was more alert last week with the same medications.  I do not think she is in nonconvulsive status epilepticus.  Her EEG was also negative for epileptiform activity and clinically patient not having any seizures. Most likely explanation for her altered mental status it is underlying infection, possibly from PEG site.  Also has been off tube feeds.  Would continue current AEDs for now to prevent seizures, however if family considering palliative route, would then taper medications first before going comfort care route.    [name] [facility] Pager Number [phone] For questions after 7pm please refer to AMION to reach the Neurologist on call

## 2018-08-13 NOTE — Progress Notes (Signed)
ANTICOAGULATION CONSULT NOTE - Follow Up Consult  Pharmacy Consult for Apixaban Indication: carotid thrombus and CVA  Allergies  Allergen Reactions  . Carbamazepine Hives, Shortness Of Breath and Other (See Comments)  . Penicillins Anaphylaxis, Other (See Comments) and Swelling    Mother, father and brother have history of anaphylaxis reaction to penicillin so pt does not take  My throat swells Familial history of anaphylaxis   . Statins Other (See Comments)    Severe muscle weakness, leg numbness, severe headaches, chest pain   . Tricyclic Antidepressants Other (See Comments)    IMPAIRED MEMORY  . Atorvastatin Other (See Comments)    Severe muscle weakness, leg numbness, severe headaches, chest pain   . Ezetimibe Other (See Comments)    Severe muscle weakness, leg numbness, severe headaches, chest pain   . Nortriptyline Other (See Comments)    IMPAIRED MEMORY  . Quinapril Hcl Other (See Comments)    Unknown reaction  . Rifampin Diarrhea  . Metoclopramide Nausea And Vomiting and Rash    Patient Measurements: Height: 5\' 2"  (157.5 cm) Weight: 131 lb 13.4 oz (59.8 kg) IBW/kg (Calculated) : 50.1  Vital Signs: Temp: 98 F (36.7 C) (10/26 0815) Temp Source: Axillary (10/26 0815) BP: 109/80 (10/26 0359) Pulse Rate: 92 (10/26 0359)  Labs: Recent Labs    08/11/18 1120 08/12/18 1204  HGB 8.7*  --   HCT 28.7*  --   PLT 298  --   CREATININE 0.76 0.66    Estimated Creatinine Clearance: 54.7 mL/min (by C-G formula based on SCr of 0.66 mg/dL).   Medications:  Lovenox 60mg  given at 10am   Assessment: 67 year old female who is restarting anticoagulation with apixaban for carotid thrombus and CVA. She has been on Lovenox since 10/24 to facilitate PEG placement. Her last dose of Lovenox was at 10am, so we can restart her Eliquis at 2200.  Goal of Therapy:   Monitor platelets by anticoagulation protocol: Yes   Plan:  Eliquis 5mg  via tube BID  Legrand Como,  Pharm.D., BCPS, BCIDP Clinical Pharmacist Phone: [phone] Please check AMION for all [place] numbers 08/13/2018, 12:48 PM

## 2018-08-13 NOTE — Progress Notes (Signed)
Hypoglycemic Event  CBG: 57  Treatment: D50 IV 50 mL  Symptoms: None  Follow-up CBG: Time:1805 CBG Result:148  Possible Reasons for Event: Inadequate meal intake  Comments/MD notified:Dr. Karleen Hampshire notified, ivf changed to d5 ns    [name]

## 2018-08-14 DIAGNOSIS — Z66 Do not resuscitate: Secondary | ICD-10-CM

## 2018-08-14 LAB — COMPREHENSIVE METABOLIC PANEL
ALK PHOS: 68 U/L (ref 38–126)
ALT: 9 U/L (ref 0–44)
AST: 17 U/L (ref 15–41)
Albumin: 1.9 g/dL — ABNORMAL LOW (ref 3.5–5.0)
Anion gap: 13 (ref 5–15)
BILIRUBIN TOTAL: 0.4 mg/dL (ref 0.3–1.2)
CALCIUM: 8.4 mg/dL — AB (ref 8.9–10.3)
CO2: 21 mmol/L — ABNORMAL LOW (ref 22–32)
CREATININE: 0.66 mg/dL (ref 0.44–1.00)
Chloride: 106 mmol/L (ref 98–111)
GFR calc Af Amer: >60 mL/min (ref >60)
Glucose, Bld: 109 mg/dL — ABNORMAL HIGH (ref 70–99)
Potassium: 2.5 mmol/L — CL (ref 3.5–5.1)
Sodium: 140 mmol/L (ref 135–145)
Total Protein: 5.5 g/dL — ABNORMAL LOW (ref 6.5–8.1)

## 2018-08-14 LAB — GLUCOSE, CAPILLARY
GLUCOSE-CAPILLARY: 138 mg/dL — AB (ref 70–99)
GLUCOSE-CAPILLARY: 111 mg/dL — AB (ref 70–99)
GLUCOSE-CAPILLARY: 99 mg/dL (ref 70–99)
Glucose-Capillary: 118 mg/dL — ABNORMAL HIGH (ref 70–99)
Glucose-Capillary: 146 mg/dL — ABNORMAL HIGH (ref 70–99)
Glucose-Capillary: 148 mg/dL — ABNORMAL HIGH (ref 70–99)
Glucose-Capillary: 54 mg/dL — ABNORMAL LOW (ref 70–99)
Glucose-Capillary: 87 mg/dL (ref 70–99)

## 2018-08-14 LAB — CBC
HEMATOCRIT: 25.8 % — AB (ref 36.0–46.0)
Hemoglobin: 7.9 g/dL — ABNORMAL LOW (ref 12.0–15.0)
MCH: 27.8 pg (ref 26.0–34.0)
MCHC: 30.6 g/dL (ref 30.0–36.0)
MCV: 90.8 fL (ref 80.0–100.0)
Platelets: 297 10*3/uL (ref 150–400)
RBC: 2.84 MIL/uL — ABNORMAL LOW (ref 3.87–5.11)
RDW: 14.9 % (ref 11.5–15.5)
WBC: 9.5 10*3/uL (ref 4.0–10.5)
nRBC: 0.3 % — ABNORMAL HIGH (ref 0.0–0.2)

## 2018-08-14 LAB — MAGNESIUM: Magnesium: 1.4 mg/dL — ABNORMAL LOW (ref 1.7–2.4)

## 2018-08-14 LAB — PHOSPHORUS: Phosphorus: 2.3 mg/dL — ABNORMAL LOW (ref 2.5–4.6)

## 2018-08-14 MED ORDER — POTASSIUM CHLORIDE 20 MEQ/15ML (10%) PO SOLN
40.0000 meq | Freq: Two times a day (BID) | ORAL | Status: DC
  Administered 2018-08-14: 40 meq
  Filled 2018-08-14: qty 30

## 2018-08-14 MED ORDER — POTASSIUM CHLORIDE CRYS ER 20 MEQ PO TBCR
40.0000 meq | EXTENDED_RELEASE_TABLET | Freq: Two times a day (BID) | ORAL | Status: DC
  Filled 2018-08-14: qty 2

## 2018-08-14 MED ORDER — SODIUM CHLORIDE 0.9 % IV SOLN
150.0000 mg | Freq: Two times a day (BID) | INTRAVENOUS | Status: DC
  Administered 2018-08-14 – 2018-08-15 (×2): 150 mg via INTRAVENOUS
  Filled 2018-08-14 (×3): qty 15

## 2018-08-14 MED ORDER — JEVITY 1.5 CAL/FIBER PO LIQD
1000.0000 mL | ORAL | Status: AC
  Administered 2018-08-14: 1000 mL
  Filled 2018-08-14: qty 1000

## 2018-08-14 MED ORDER — MAGNESIUM SULFATE 4 GM/100ML IV SOLN
4.0000 g | Freq: Once | INTRAVENOUS | Status: DC
  Filled 2018-08-14: qty 100

## 2018-08-14 MED ORDER — MORPHINE SULFATE (PF) 2 MG/ML IV SOLN
1.0000 mg | INTRAVENOUS | Status: DC | PRN
  Administered 2018-08-14 (×2): 2 mg via INTRAVENOUS
  Administered 2018-08-15: 1 mg via INTRAVENOUS
  Administered 2018-08-15 – 2018-08-16 (×4): 2 mg via INTRAVENOUS
  Filled 2018-08-14 (×7): qty 1

## 2018-08-14 MED ORDER — LEVETIRACETAM IN NACL 1000 MG/100ML IV SOLN
1000.0000 mg | Freq: Two times a day (BID) | INTRAVENOUS | Status: DC
  Administered 2018-08-14 – 2018-08-15 (×2): 1000 mg via INTRAVENOUS
  Filled 2018-08-14 (×3): qty 100

## 2018-08-14 MED ORDER — POTASSIUM CHLORIDE 10 MEQ/100ML IV SOLN
10.0000 meq | INTRAVENOUS | Status: DC
  Administered 2018-08-14 (×2): 10 meq via INTRAVENOUS
  Filled 2018-08-14 (×4): qty 100

## 2018-08-14 MED ORDER — VALPROATE SODIUM 500 MG/5ML IV SOLN
500.0000 mg | Freq: Three times a day (TID) | INTRAVENOUS | Status: DC
  Administered 2018-08-14 – 2018-08-15 (×3): 500 mg via INTRAVENOUS
  Filled 2018-08-14 (×5): qty 5

## 2018-08-14 MED ORDER — MORPHINE SULFATE (CONCENTRATE) 10 MG/0.5ML PO SOLN
5.0000 mg | ORAL | Status: DC | PRN
  Administered 2018-08-14: 5 mg
  Filled 2018-08-14: qty 0.5

## 2018-08-14 NOTE — Progress Notes (Signed)
PROGRESS NOTE    [name]  URK:[id number] DOB: January 17, 1951 DOA: 07/13/2018 PCP: [name], MD  Brief Narrative:    67 year old F w/ a history of prior CVAs with carotid artery disease, hypertension, hyperlipidemia, normal pressure hydrocephalus status post shunt who was admitted with seizures and a new left MCA territory stroke.  Left ICA thrombus.Treated with heparin drip initially due to planned procedures and antiplatelets.   Had recurrent seizures and transferred to ICU 9/29 for Versed and seizure suppression. Intubated and ventilated 9/29.  After extubation, had persistent dysphagia status post cord track placement then later PEG tube placement by interventional radiology on 08/05/2018.  Hospital course complicated by lethargy.  Initially on Vimpat, Keppra, Depakote, and gabapentin.  Gabapentin stopped on 08/08/2018 as recommended by neurology due to persistent somnolence.  Repeated EEG done on 08/08/2018 with no recurrence of seizure.  Elevated ammonia level.  Started on lactulose.  No noted valproic acid toxicity. On 10/25 she underwent Successful bedside expression peri-gastrostomy track abscess/hematoma. 10/27 palliative care discussed with family and transitioned to full comfort care.     Assessment & Plan:   Active Problems:   Dysphagia   Acute CVA (cerebrovascular accident) ([facility])   Type II diabetes mellitus with complication ([place])   Benign essential HTN   Hypokalemia   Encounter for intubation   CVA (cerebral vascular accident) ([facility])   Diabetes mellitus ([place])   Status epilepticus ([place])   SOB (shortness of breath)   Advanced care planning/counseling discussion   Goals of care, counseling/discussion   Aspiration into airway   Adult failure to thrive   Palliative care encounter   Left hemispheric acute CVA secondary to left ICA stenosis in ICA thrombus Patient unable to tolerate any PEG tube feeds with multiple attempts with water boluses. Family  discussed with palliative and transition her to full comfort measures.   Acute metabolic encephalopathy secondary to seizures versus hyper ammonemia.  Neurology reconsulted and recommended seizure medications with Keppra, valproate and Vimpat, changed to IV as she is unable to tolerate anything by PEG .  Stop gabapentin as per recommendations by neurology. As family decided to transition to comfort measures we will start tapering the dose of antiepileptic drugs before finally taking them off.   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  Seizure precautions.  Repeat EEG shows moderate cerebral dysfunction but no epileptiform activity.  Ammonia level is 20 , TSH is within normal limits , vitamin B12 within normal limits.  Type 2 diabetes CBG (last 3)  Recent Labs    08/13/18 2349 08/14/18 0833 08/14/18 1223  GLUCAP 90 118* 99    last A1c 6.3.  Currently on comfort measures please stop CBG readings.    Anemia of chronic disease No signs of overt bleeding Hemoglobin stable around 8.    Severe dysphagia status post PEG tube placement by IR on October 18. Currently tube feeds on hold  Due to persistent vomiting.   Persistent nausea and vomiting appears to have resolved Abdominal x-ray showed adynamic ileus versus gastroenteritis.  It was followed by CT abd and pelvis ordered for further evaluation, did not show any obstruction. Mild enlargement of the left rectus abdominus muscle just inferior to the gastrostomy tube favored to reflect small postprocedural hematoma related to recent placement. Gastrostomy tube appears in appropriate position. IR reconsulted for repositioning of the disc and underwent successful bedside expression of peri-gastrostomy tract abscess/hematoma.  She was also started on IV Rocephin.,  Discontinue IV  Rocephin as she is being transitioned to comfort measures.    Acute kidney injury secondary to dehydration    Resolved with IV hydration.    pneumonitis from possible aspiration  Chest x-ray does not show any overt pneumonia.   Hypokalemia: replaced.     DVT prophylaxis: Lovenox.  Code Status: DNR Family Communication: Discussed of treatment, care and plan with the daughter at bedside Disposition Plan: To be discussed with the family  Consultants:   Neurology  IR  Palliative care  Procedures: PEG tube placement by IR on October 18. CT abd and pelvis.    Antimicrobials: Rocephin   Subjective: Persistent vomiting but she is not in any kind of distress at this time. Objective: Vitals:   08/14/18 0412 08/14/18 0415 08/14/18 0835 08/14/18 1226  BP: 138/70  (!) 160/68 (!) 151/67  Pulse: 91  88 93  Resp: 18  15 15   Temp: 97.9 F (36.6 C)  97.7 F (36.5 C) 98.1 F (36.7 C)  TempSrc: Oral  Oral   SpO2: 100%  100% 100%  Weight:  61.5 kg    Height:        Intake/Output Summary (Last 24 hours) at 08/14/2018 1542 Last data filed at 08/14/2018 1136 Gross per 24 hour  Intake 1561.97 ml  Output -  Net 1561.97 ml   Filed Weights   08/10/18 0419 08/13/18 0359 08/14/18 0415  Weight: 60.5 kg 59.8 kg 61.5 kg    Examination:  General exam: Alert opening eyes to verbal cues, not in any kind of distress Respiratory system: Diminished air entry at bases, coarse breath sounds, no wheezing or rhonchi Cardiovascular system: S1 & S2 heard, RRR. No JVD No pedal edema. Gastrointestinal system: Abdomen is soft, nondistended, nontender, PEG in place without any pus or serous drainage. Central nervous system: Alert, opening eyes on calling her name. Extremities: Cyanosis or clubbing Skin: No rashes, lesions or ulcers Psychiatry: no agitation overnight.     Data Reviewed: I have personally reviewed following labs and imaging studies  CBC: Recent Labs  Lab 08/08/18 0404 08/10/18 0535 08/11/18 1120 08/14/18 0556  WBC 12.2* 13.3* 13.6* 9.5  HGB 9.2* 8.6* 8.7* 7.9*  HCT 29.6*  27.9* 28.7* 25.8*  MCV 90.5 91.8 90.8 90.8  PLT 322 270 298 300   Basic Metabolic Panel: Recent Labs  Lab 08/08/18 0404 08/10/18 0535 08/11/18 1120 08/12/18 1204 08/14/18 0556  NA 138 139 140 137 140  K 4.4 3.4* 3.4* 3.2* 2.5*  CL 106 106 105 102 106  CO2 25 24 24 22  21*  GLUCOSE 138* 107* 113* 106* 109*  BUN 12 9 7* <5* <5*  CREATININE 0.74 0.72 0.76 0.66 0.66  CALCIUM 8.5* 8.7* 9.1 8.8* 8.4*  MG  --   --  1.7  --  1.4*  PHOS  --   --   --   --  2.3*   GFR: Estimated Creatinine Clearance: 59.7 mL/min (by C-G formula based on SCr of 0.66 mg/dL). Liver Function Tests: Recent Labs  Lab 08/11/18 1120 08/14/18 0556  AST 17 17  ALT 11 9  ALKPHOS 91 68  BILITOT 0.4 0.4  PROT 6.3* 5.5*  ALBUMIN 2.3* 1.9*   Recent Labs  Lab 08/11/18 1120  LIPASE 24   Recent Labs  Lab 08/08/18 1226 08/12/18 1914  AMMONIA 54* 20   Coagulation Profile: No results for input(s): INR, PROTIME in the last 168 hours. Cardiac Enzymes: No results for input(s): CKTOTAL, CKMB, CKMBINDEX, TROPONINI in the last  168 hours. BNP (last 3 results) No results for input(s): PROBNP in the last 8760 hours. HbA1C: No results for input(s): HGBA1C in the last 72 hours. CBG: Recent Labs  Lab 08/13/18 1737 08/13/18 2012 08/13/18 2349 08/14/18 0833 08/14/18 1223  GLUCAP 54* 91 90 118* 99   Lipid Profile: No results for input(s): CHOL, HDL, LDLCALC, TRIG, CHOLHDL, LDLDIRECT in the last 72 hours. Thyroid Function Tests: No results for input(s): TSH, T4TOTAL, FREET4, T3FREE, THYROIDAB in the last 72 hours. Anemia Panel: No results for input(s): VITAMINB12, FOLATE, FERRITIN, TIBC, IRON, RETICCTPCT in the last 72 hours. Sepsis Labs: Recent Labs  Lab 08/08/18 0404  PROCALCITON 0.12  LATICACIDVEN 1.5    No results found for this or any previous visit (from the past 240 hour(s)).       Radiology Studies: No results found.      Scheduled Meds: . chlorhexidine  15 mL Mouth Rinse BID  .  glycopyrrolate  2 mg Per Tube BID  . guaiFENesin-dextromethorphan  15 mL Per Tube BID  . ipratropium-albuterol  3 mL Nebulization BID  . lactulose  30 g Per Tube BID   Continuous Infusions: . lacosamide (VIMPAT) IV    . levETIRAcetam    . valproate sodium       LOS: 32 days    Time spent: 31 minutes    Hosie Poisson, MD [facility] Pager [phone] If 7PM-7AM, please contact night-coverage [email or web address] Password [facility] 08/14/2018, 3:42 PM

## 2018-08-14 NOTE — Progress Notes (Addendum)
Reason for consult:   Subjective: More alert this morning when compared to yesterday.  Has increased secretions in mouth requiring suctioning   ROS: negative except above  Examination  Vital signs in last 24 hours: Temp:  [97.7 F (36.5 C)-98.5 F (36.9 C)] 98.5 F (36.9 C) (10/27 2038) Pulse Rate:  [88-93] 91 (10/27 2038) Resp:  [12-30] 12 (10/27 2038) BP: (128-161)/(62-92) 128/64 (10/27 2038) SpO2:  [96 %-100 %] 99 % (10/27 2038) Weight:  [61.5 kg] 61.5 kg (10/27 0415)  General: lying in bed CVS: pulse-normal rate and rhythm RS: breathing comfortably Extremities: normal   Neuro: Patient slightly more alert following commands.No neglect, gaze preference.  Dysarthric but will state her name Raises both arms, has mild tremors of the right arm while raising it against gravity.   Basic Metabolic Panel: Recent Labs  Lab 08/08/18 0404 08/10/18 0535 08/11/18 1120 08/12/18 1204 08/14/18 0556  NA 138 139 140 137 140  K 4.4 3.4* 3.4* 3.2* 2.5*  CL 106 106 105 102 106  CO2 25 24 24 22  21*  GLUCOSE 138* 107* 113* 106* 109*  BUN 12 9 7* <5* <5*  CREATININE 0.74 0.72 0.76 0.66 0.66  CALCIUM 8.5* 8.7* 9.1 8.8* 8.4*  MG  --   --  1.7  --  1.4*  PHOS  --   --   --   --  2.3*    CBC: Recent Labs  Lab 08/08/18 0404 08/10/18 0535 08/11/18 1120 08/14/18 0556  WBC 12.2* 13.3* 13.6* 9.5  HGB 9.2* 8.6* 8.7* 7.9*  HCT 29.6* 27.9* 28.7* 25.8*  MCV 90.5 91.8 90.8 90.8  PLT 322 270 298 297     Coagulation Studies: No results for input(s): LABPROT, INR in the last 72 hours.      ASSESSMENT AND PLAN  Partial status epilepticus-resolved Left MCA watershed infarcts Persistent encephalopathy -likely multifactorial including PEG site infection, possible aspiration, multiple medications.    No change in plan from yesterday Continue current AEDs  Kinzlee Selvy [facility] Pager Number [phone] For questions after 7pm please refer to AMION to reach the  Neurologist on call

## 2018-08-15 MED ORDER — LORAZEPAM 2 MG/ML IJ SOLN: 1.0000 mg | Freq: Once | INTRAMUSCULAR | Status: DC

## 2018-08-15 MED ORDER — SODIUM CHLORIDE 0.9 % IV SOLN
100.0000 mg | Freq: Two times a day (BID) | INTRAVENOUS | Status: DC
  Administered 2018-08-16 (×2): 100 mg via INTRAVENOUS
  Filled 2018-08-15 (×3): qty 10

## 2018-08-15 MED ORDER — BIOTENE DRY MOUTH MT LIQD: 15.0000 mL | OROMUCOSAL | Status: DC | PRN

## 2018-08-15 MED ORDER — GLYCOPYRROLATE 0.2 MG/ML IJ SOLN
0.4000 mg | INTRAMUSCULAR | Status: DC | PRN
  Administered 2018-08-15: 0.4 mg via INTRAVENOUS
  Filled 2018-08-15: qty 2

## 2018-08-15 MED ORDER — VALPROATE SODIUM 500 MG/5ML IV SOLN
500.0000 mg | Freq: Two times a day (BID) | INTRAVENOUS | Status: DC
  Administered 2018-08-16 (×2): 500 mg via INTRAVENOUS
  Filled 2018-08-15 (×3): qty 5

## 2018-08-15 MED ORDER — SODIUM CHLORIDE 0.9 % IV SOLN
750.0000 mg | Freq: Two times a day (BID) | INTRAVENOUS | Status: DC
  Administered 2018-08-15 – 2018-08-16 (×2): 750 mg via INTRAVENOUS
  Filled 2018-08-15 (×3): qty 7.5

## 2018-08-15 MED ORDER — GLYCOPYRROLATE 0.2 MG/ML IJ SOLN
0.4000 mg | INTRAMUSCULAR | Status: DC | PRN
  Administered 2018-08-15 – 2018-08-16 (×2): 0.4 mg via INTRAVENOUS
  Filled 2018-08-15 (×3): qty 2

## 2018-08-15 MED ORDER — LORAZEPAM 2 MG/ML IJ SOLN
1.0000 mg | INTRAMUSCULAR | Status: DC | PRN
  Administered 2018-08-15 – 2018-08-16 (×2): 2 mg via INTRAVENOUS
  Filled 2018-08-15 (×3): qty 1

## 2018-08-15 MED ORDER — POLYVINYL ALCOHOL 1.4 % OP SOLN
1.0000 [drp] | Freq: Four times a day (QID) | OPHTHALMIC | Status: DC | PRN
  Administered 2018-08-15: 1 [drp] via OPHTHALMIC
  Filled 2018-08-15: qty 15

## 2018-08-15 MED ORDER — SCOPOLAMINE 1 MG/3DAYS TD PT72
1.0000 | MEDICATED_PATCH | TRANSDERMAL | Status: DC
  Administered 2018-08-15: 1.5 mg via TRANSDERMAL
  Filled 2018-08-15: qty 1

## 2018-08-15 NOTE — Progress Notes (Signed)
Spoke with pharmacy regarding possible order for scopolamine patch to assist pt in managing oral secretions.  Pt has hx of seizures, noted seizure warning on medication, spoke with pharmacy who stated that the medication should not cause any issues at appropriate dose. Order given for patch by palliative care NP. Arlis Porta

## 2018-08-15 NOTE — Progress Notes (Signed)
Nutrition Brief Note  Chart reviewed. Pt transitioned to comfort care. Tube feeds have been discontinued. No further nutrition interventions warranted at this time. Please re-consult as needed.   Corrin Parker, MS, RD, LDN Pager # [phone] After hours/ weekend pager # [phone]

## 2018-08-15 NOTE — Progress Notes (Signed)
   08/15/18 1112  Clinical Encounter Type  Visited With Patient and family together  Visit Type Follow-up  Referral From Palliative care team  Consult/Referral To Chaplain  The chaplain followed up with spiritual care for the Pt. daughter-Rebecca.  The Pt. was resting comfortably at time of visit.  The chaplain listened to the Pt. daughter as she recapped the weekend's events and found comfort in the family decisions for comfort care.  The Pt. daughter shared a possible transition to 6N for the Pt. comfort and opportunities for the family to be more present.  The Pt. daughter thanked the chaplain for stopping by and just letting her talk. The chaplain will continue to provide spiritual care as needed.

## 2018-08-15 NOTE — Progress Notes (Signed)
Noted patient's change in status to comfort only care.  On my exam today, she is poorly responsive, opens eyes minimally but does not follow commands.  Per daughter, she has been complaining of headache, could use analgesics as needed.  I would continue antiepileptics as long as she is able to tolerate them, could use Ativan sublingual if she continues to have seizures.  She is being transferred to an inpatient hospice facility.  Please call if neurology can be of any further assistance.   Roland Rack, MD [facility] [phone]  If 7pm- 7am, please page neurology on call as listed in [place].

## 2018-08-15 NOTE — Progress Notes (Signed)
CSW acknowledging change of discharge plan to residential hospice. CSW sent referral to [place]. Admissions representative met with family, and will follow for when a bed becomes available.  CSW to follow.  Laveda Abbe, [place] Clinical Social Worker [phone]

## 2018-08-15 NOTE — Progress Notes (Signed)
[place]: [employer] Referral received from [place] at [facility] for [facility]   Met with pts daughter [name] and Gypsy Decant. Confirmed there interest in placement at our [facility] [place]. We unfortunately do not have a bed to offer at this time we are full. I will reach out upon first available bed to help with d/c plan to the hospice home. [place] [phone] Hospital Liaison

## 2018-08-15 NOTE — Progress Notes (Signed)
Pt having intermittent episodes of agitation and anxiety. PRN ativan was given per tube, however, this caused two episodes of vomiting. Notified provider. Arlis Porta

## 2018-08-15 NOTE — Progress Notes (Signed)
PROGRESS NOTE    [name]  ZHG:[id number] DOB: [birth year] DOA: 07/13/2018 PCP: [name], MD  Brief Narrative:    67 year old F w/ a history of prior CVAs with carotid artery disease, hypertension, hyperlipidemia, normal pressure hydrocephalus status post shunt who was admitted with seizures and a new left MCA territory stroke.  Left ICA thrombus.Treated with heparin drip initially due to planned procedures and antiplatelets.   Had recurrent seizures and transferred to ICU 9/29 for Versed and seizure suppression. Intubated and ventilated 9/29.  After extubation, had persistent dysphagia status post cord track placement then later PEG tube placement by interventional radiology on 08/05/2018.  Hospital course complicated by lethargy.  Initially on Vimpat, Keppra, Depakote, and gabapentin.  Gabapentin stopped on 08/08/2018 as recommended by neurology due to persistent somnolence.  Repeated EEG done on 08/08/2018 with no recurrence of seizure.  Elevated ammonia level.  Started on lactulose.  No noted valproic acid toxicity. On 10/25 she underwent Successful bedside expression peri-gastrostomy track abscess/hematoma. 10/27 palliative care discussed with family and transitioned to full comfort care.     Assessment & Plan:   Active Problems:   Dysphagia   Acute CVA (cerebrovascular accident) ([place])   Type II diabetes mellitus with complication ([place])   Benign essential HTN   Hypokalemia   Encounter for intubation   CVA (cerebral vascular accident) ([place])   Diabetes mellitus ([place])   Status epilepticus ([place])   SOB (shortness of breath)   Advanced care planning/counseling discussion   Goals of care, counseling/discussion   Aspiration into airway   Adult failure to thrive   Palliative care encounter   Left hemispheric acute CVA secondary to left ICA stenosis in ICA thrombus Patient unable to tolerate any PEG tube feeds with multiple attempts with water boluses. Family  discussed with palliative and transition her to full comfort measures. PT appears comfortable.    Acute metabolic encephalopathy secondary to seizures versus hyper ammonemia.  Neurology reconsulted and recommended seizure medications with Keppra, valproate and Vimpat, changed to IV as she is unable to tolerate anything by PEG .  Stop gabapentin as per recommendations by neurology. As family decided to transition to comfort measures we will start tapering the dose of antiepileptic drugs before finally taking them off.    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  Seizure precautions.  Repeat EEG shows moderate cerebral dysfunction but no epileptiform activity.  Ammonia level is 20 , TSH is within normal limits , vitamin B12 within normal limits.  Type 2 diabetes CBG (last 3)  Recent Labs    08/14/18 0410 08/14/18 0833 08/14/18 1223  GLUCAP 87 118* 99    last A1c 6.3.  Currently on comfort measures please stop CBG readings.    Anemia of chronic disease No signs of overt bleeding Hemoglobin stable around 8.    Severe dysphagia status post PEG tube placement by IR on October 18. Currently tube feeds on hold  Due to persistent vomiting.   Persistent nausea and vomiting appears to have resolved Abdominal x-ray showed adynamic ileus versus gastroenteritis.  It was followed by CT abd and pelvis ordered for further evaluation, did not show any obstruction. Mild enlargement of the left rectus abdominus muscle just inferior to the gastrostomy tube favored to reflect small postprocedural hematoma related to recent placement. Gastrostomy tube appears in appropriate position. IR reconsulted for repositioning of the disc and underwent successful bedside expression of peri-gastrostomy tract abscess/hematoma.  She was also started on  IV Rocephin.,  Discontinue IV Rocephin as she is being transitioned to comfort measures.  She was given meds via  PEG ths am and she started vomiting. Discussed with RN not to give anything via PEG at this time.     Acute kidney injury secondary to dehydration  Resolved with IV hydration.    pneumonitis from possible aspiration  Chest x-ray does not show any overt pneumonia.   Hypokalemia: replaced.     DVT prophylaxis: Lovenox.  Code Status: DNR Family Communication: Discussed with daughter at bedside.  Disposition Plan: full comfort measures. Discussions to be initiated for residential hospice.   Consultants:   Neurology  IR  Palliative care  Procedures: PEG tube placement by IR on October 18. CT abd and pelvis.    Antimicrobials: Rocephin   Subjective: Vomiting with meds given via peg.  Objective: Vitals:   08/14/18 0835 08/14/18 1226 08/14/18 1552 08/14/18 2038  BP: (!) 160/68 (!) 151/67 (!) 161/92 128/64  Pulse: 88 93 90 91  Resp: 15 15 (!) 30 12  Temp: 97.7 F (36.5 C) 98.1 F (36.7 C)  98.5 F (36.9 C)  TempSrc: Oral   Oral  SpO2: 100% 100% 96% 99%  Weight:      Height:        Intake/Output Summary (Last 24 hours) at 08/15/2018 1125 Last data filed at 08/15/2018 0304 Gross per 24 hour  Intake 322.77 ml  Output -  Net 322.77 ml   Filed Weights   08/10/18 0419 08/13/18 0359 08/14/18 0415  Weight: 60.5 kg 59.8 kg 61.5 kg    Examination:  General exam: unresponsive, but not in distress.  Respiratory system: Diminished air entry at bases, no wheezing or rhonchi.  Cardiovascular system: S1 & S2 heard, RRR. No JVD No pedal edema. Gastrointestinal system: Abdomen is soft, bowel sounds good.  Central nervous system: unresponsive.  Extremities:  No Cyanosis or clubbing Skin: No rashes, lesions or ulcers Psychiatry: no agitation overnight.     Data Reviewed: I have personally reviewed following labs and imaging studies  CBC: Recent Labs  Lab 08/10/18 0535 08/11/18 1120 08/14/18 0556  WBC 13.3* 13.6* 9.5  HGB 8.6* 8.7* 7.9*  HCT 27.9* 28.7*  25.8*  MCV 91.8 90.8 90.8  PLT 270 298 536   Basic Metabolic Panel: Recent Labs  Lab 08/10/18 0535 08/11/18 1120 08/12/18 1204 08/14/18 0556  NA 139 140 137 140  K 3.4* 3.4* 3.2* 2.5*  CL 106 105 102 106  CO2 24 24 22  21*  GLUCOSE 107* 113* 106* 109*  BUN 9 7* <5* <5*  CREATININE 0.72 0.76 0.66 0.66  CALCIUM 8.7* 9.1 8.8* 8.4*  MG  --  1.7  --  1.4*  PHOS  --   --   --  2.3*   GFR: Estimated Creatinine Clearance: 59.7 mL/min (by C-G formula based on SCr of 0.66 mg/dL). Liver Function Tests: Recent Labs  Lab 08/11/18 1120 08/14/18 0556  AST 17 17  ALT 11 9  ALKPHOS 91 68  BILITOT 0.4 0.4  PROT 6.3* 5.5*  ALBUMIN 2.3* 1.9*   Recent Labs  Lab 08/11/18 1120  LIPASE 24   Recent Labs  Lab 08/08/18 1226 08/12/18 1914  AMMONIA 54* 20   Coagulation Profile: No results for input(s): INR, PROTIME in the last 168 hours. Cardiac Enzymes: No results for input(s): CKTOTAL, CKMB, CKMBINDEX, TROPONINI in the last 168 hours. BNP (last 3 results) No results for input(s): PROBNP in the last 8760 hours. HbA1C:  No results for input(s): HGBA1C in the last 72 hours. CBG: Recent Labs  Lab 08/13/18 2012 08/13/18 2349 08/14/18 0410 08/14/18 0833 08/14/18 1223  GLUCAP 91 90 87 118* 99   Lipid Profile: No results for input(s): CHOL, HDL, LDLCALC, TRIG, CHOLHDL, LDLDIRECT in the last 72 hours. Thyroid Function Tests: No results for input(s): TSH, T4TOTAL, FREET4, T3FREE, THYROIDAB in the last 72 hours. Anemia Panel: No results for input(s): VITAMINB12, FOLATE, FERRITIN, TIBC, IRON, RETICCTPCT in the last 72 hours. Sepsis Labs: No results for input(s): PROCALCITON, LATICACIDVEN in the last 168 hours.  No results found for this or any previous visit (from the past 240 hour(s)).       Radiology Studies: No results found.      Scheduled Meds: . LORazepam  1 mg Intravenous Once  . scopolamine  1 patch Transdermal Q72H   Continuous Infusions: . lacosamide  (VIMPAT) IV 150 mg (08/15/18 0942)  . levETIRAcetam 1,000 mg (08/15/18 1050)  . valproate sodium 500 mg (08/15/18 0506)     LOS: 33 days    Time spent: 25 minutes    Hosie Poisson, MD [facility] Pager [phone] If 7PM-7AM, please contact night-coverage [email or web address] Password [email or web address] 08/15/2018, 11:25 AM

## 2018-08-15 NOTE — Progress Notes (Signed)
Daily Progress Note   Patient Name: [name]       Date: 08/15/2018 DOB: 07/05/1951  Age: 67 y.o. MRN#: [id number] Attending Physician: Hosie Poisson, MD Primary Care Physician: [name], MD Admit Date: 07/13/2018  Reason for Consultation/Follow-up: Establishing goals of care  Subjective: Patient lying in be resting. Easily awaken by voice of stimuli. Denies pain or discomfort. Able to mumble responses and follows commands. Patient able to verify name. Tube feeding restarted today and she is currently tolerating.   Family is at bedside (daughter, sister, and husband). Detailed conversation with family regarding patient's current illness and co-morbidities. Daughter and husband tearful stating patient having tremors in arms and they are afraid she may be having silent seizures or indications that seizures may restart. Support given. Daughter also expresses that her mom is more awake today making it hard for family to make medical decisions. Husband agrees with daughter and states now that she is more awake it gives them the false hope that maybe she is showing signs of improvement and could possibly recover knowing that their expectations are not realistic. Allowed family time to express their emotions and feelings. I educated family that patient could be demonstrating a "rally" where patients tend to show signs of alertness only to eventually go back to health decline and lethargy state. Family verbalized understanding and appreciation. Daughter stated prior to my arrival patient woke up moaning and complained of some generalized pain. Patient also expressed to family she was "tired" and "ready to go" Family was questioning patients intentions based on those phrases. Advised family patient  could potentially be given them her wishes in regards to her care. Daughter stated she felt that is what she meant but wanted clarification before making any drastic decisions.   Family expressed difficulty making decision regarding discontinuing aggressive measures as they do not want to make the wrong mistake. I provided support and comfort. Discussed with family no decision at this point is a wrong decision as both would be supportive to patient. I advised husband and daughter to be a voice for the patient, if she was able to express openly her feelings of her current situation and how she would like to proceed with care what would she say to them. Husband was tearful and voiced patient would want to be allowed  to be kept comfortable and not continue with current interventions. He reports she has always been about quality of life and he reflects on a previous hospitalization where patient received CPR and when she woke up she expressed her dissatisfaction at that time with allowing her to go through CPR and not allowing her to pass away. Daughter tearful and verbalized agreement. I explained to family when considering what patient would want to know that their decisions are being the patient's voice and not withdrawing care without knowing it is what she want. Family appreciative.    Husband inquired about comfort measures and what that would look like. I discussed in details what comfort care would look like and educated family on care. Family verbalized awareness that patient would not continue to receive forms of aggressive interventions such as tube feeding, IV fluids, medications not focused on comfort, radiology exams, frequent fingersticks or labwork. All care would be focused on comfort and managing symptoms such as pain, nausea, vomiting, agitation/anxiety, and/or shortness of breath etc. Family verbalized understanding.   At this time daughter verbalizes her brother is still hopeful for  improvement and feels that she has recovered before and will again. Daughter is tearful and states he is not coping well and remains somewhat unrealistic compared to her and her father who have provided the bulk of care over the past few months. Support given. Husband also stated they are Catholics and their priest came to visit last night and patient was given last rites in preparation of death.   At this time patient's family voiced wishes to continue to treat the treatable, see how patient tolerates tube feedings today, and follow back up tomorrow unless there is a change. Support given.   I educated family if patient was to awake again and express similar thoughts such as she is "tired" and or "ready to go" to ask her what she meant. Family verbalized understanding.   During my assessment patient did awake and when asked about pain she denied, however patient mumbled "stop medications please" Family at the bedside and became tearful. Family expressed wishes to contact her son to visit and have a further discussion amongst themselves. Support provided.   Addendum 1418-Received paged that family requesting to have a follow-up conversation now that son is present. Returned to bedside and had detailed follow-up discussion with patient's husband, daughter, sister, and son. Family tearful and states patient woke up and smiled at family. Husband stated wife asked him why he was crying, he replied because he didn't like seeing the love of his life and bestfriend in this condition. Husband verbalized she smiled and said she was going to miss him. Daughter stated patient stated again "I am tired" and "I am ready to go". Son tearful and stated when they asked her what she meant she replied to them "you know what I mean". Daughter stated she asked her where was she ready to go and patient replied to them "[facility]". Family tearful. Support and comfort provided. Daughter expressed her mother smiled and told them that  she loved them and it would be ok. Family now expresses that they are at peace knowing her wishes and validating their feelings towards end-of-life care. At this time family is requesting all medical interventions not comfort focused be discontinued. They request full comfort measures for end of life transition. During conversation bedside RN made myself and family aware that patient is now vomiting and they had turned tube feedings off. Family tearful and voiced  this also confirms that end-of-life is a appropriate decision, knowing she is not tolerating at the minimum rate of 61ml/hr. Bedside RN advise to give phenergan suppository and began shifting patient to full comfort. Family verbalized agreement. Husband inquiring about potential transfer to [facility] or [facility]. Advised at this time patient would remain in hospital for symptom management. If she became stable medical team can consider transferring her to facility. Family verbalized understanding and appreciation of support. Dr. Karleen Hampshire aware of plans to shift to comfort and is also at bedside. She verbalized she would place comfort care orders.   Length of Stay: 33  Current Medications: Scheduled Meds:  . glycopyrrolate  2 mg Per Tube BID  . guaiFENesin-dextromethorphan  15 mL Per Tube BID  . lactulose  30 g Per Tube BID    Continuous Infusions: . lacosamide (VIMPAT) IV 150 mg (08/14/18 2211)  . levETIRAcetam 1,000 mg (08/14/18 2123)  . valproate sodium 500 mg (08/14/18 2256)    PRN Meds: [DISCONTINUED] acetaminophen **OR** acetaminophen (TYLENOL) oral liquid 160 mg/5 mL **OR** [DISCONTINUED] acetaminophen, acetaminophen, antiseptic oral rinse, LORazepam, LORazepam, morphine injection, morphine CONCENTRATE, ondansetron (ZOFRAN) IV, polyvinyl alcohol, promethazine  Physical Exam  Constitutional: Vital signs are normal. She is cooperative. She appears ill.  Thin, frail, chronically ill appearing  Cardiovascular: Normal rate,  regular rhythm, normal heart sounds and normal pulses.  Pulmonary/Chest: Effort normal. She has decreased breath sounds.  Abdominal: Soft. Normal appearance and bowel sounds are normal.  PEG tube in place   Neurological: She is alert.  Alert to name, unable to further assess  Skin: Skin is warm, dry and intact. Bruising noted.  Psychiatric:  Dysarthria   Nursing note and vitals reviewed.           Vital Signs: BP 128/64 (BP Location: Left Arm)   Pulse 91   Temp 98.5 F (36.9 C) (Oral)   Resp 12   Ht 5\' 2"  (1.575 m)   Wt 61.5 kg   SpO2 99%   BMI 24.80 kg/m  SpO2: SpO2: 99 % O2 Device: O2 Device: Room Air O2 Flow Rate: O2 Flow Rate (L/min): 2 L/min  Intake/output summary:   Intake/Output Summary (Last 24 hours) at 08/15/2018 0027 Last data filed at 08/14/2018 1136 Gross per 24 hour  Intake 899.89 ml  Output -  Net 899.89 ml   LBM: Last BM Date: 08/14/18 Baseline Weight: Weight: 59 kg Most recent weight: Weight: 61.5 kg      Palliative Assessment/Data:PPS 10%    Patient Active Problem List   Diagnosis Date Noted  . Adult failure to thrive   . Palliative care encounter   . Aspiration into airway   . Advanced care planning/counseling discussion   . Goals of care, counseling/discussion   . Status epilepticus ([place])   . SOB (shortness of breath)   . Adjustment disorder with depressed mood 11/02/2017  . Adjustment disorder with mixed anxiety and depressed mood   . Hemoptysis   . Hematemesis   . Intractable vomiting with nausea   . Acute lower UTI   . Urinary retention   . Diabetic gastroparesis ([place])   . Vascular headache   . Diabetes mellitus ([place])   . CVA (cerebral vascular accident) ([place]) 06/18/2017  . Weakness   . 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 Right hemiparesis ([place])   . Encounter for intubation 06/16/2017  . Palliative care by specialist 06/16/2017  . Anxiety state   .  Acute embolic stroke ([place])   . Benign  essential HTN   . Diabetic peripheral neuropathy ([place])   . Pseudotumor cerebri   . Hypokalemia   . Seizure ([place])   . Leukocytosis   . Acute blood loss anemia   . Post-operative pain   . Encounter for feeding tube placement   . Respiratory failure ([place])   . Status post tracheostomy ([place])   . Acute respiratory failure with hypoxemia ([place])   . Stenosis of left carotid artery   . Acute CVA (cerebrovascular accident) ([place]) 05/31/2017  . Type II diabetes mellitus with complication ([place]) 19/50/9326  . Hypertension 05/31/2017  . Hyponatremia 05/31/2017  . IIH (idiopathic intracranial hypertension) 01/18/2016  . 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 Worsening headaches 01/18/2016  . Increased abdominal girth 01/18/2016  . Abdominal fluid collection 01/18/2016  . Vision changes 01/18/2016  . Perceived hearing changes 01/18/2016  . Nausea without vomiting 01/18/2016  . Peripheral vision loss 01/18/2016  . Imbalance 01/18/2016  . Falls 01/18/2016  . Dysphagia 01/18/2016  . Subacute confusional state 01/18/2016  . Left breast mass 05/05/2011    Palliative Care Assessment & Plan   Patient Profile: 67 y.o.femalewith past medical history of DM2, previous CVAs (s/p trach and PEG since removed), anemia, HTN, seizures, psuedotumor cerebri, GERDadmitted on 9/25/2019with change in mental status MRI and CT confirm new CVA.During admission she experienced severe seizures requiring voluntary intubation and sedation for control. She has been extubated and is awake. Has ongoing dysphagia and continues to have R sided twitching. Much more alert yesterday and today than previously during admission. Participating some with PT and SLP. High risk for aspiration. Has coretrak placed. Palliative medicine consulted for [place] discussion.  Recommendations/Plan:  DNR/DNI  Continue to treat the treatable without escalation of care. Family discussing  wishes in regards to comfort care versus continuing with current care. Having difficulty given patient seems more alert today. Daughter expressed they are also watchfully waiting to see how patient does with tube feedings today, to help guide their decisions.   Patient more alert today and family expressed that patient told them she was "tired" and "ready to go". Family confused on what patient meant by statements and hopeful she will awaken and express again with details.   Daughter and husband leaning towards comfort and hopes that son will support. Not prepared to make decisions until all 3 are onboard and at peace with any decision.   PMT will continue to support.   Goals of Care and Additional Recommendations:  Limitations on Scope of Treatment: Full Scope Treatment  Code Status:    Code Status Orders  (From admission, onward)         Start     Ordered   08/15/18 0010  Do not attempt resuscitation (DNR)  Continuous    Question Answer Comment  In the event of cardiac or respiratory ARREST Do not call a "code blue"   In the event of cardiac or respiratory ARREST Do not perform Intubation, CPR, defibrillation or ACLS   In the event of cardiac or respiratory ARREST Use medication by any route, position, wound care, and other measures to relive pain and suffering. May use oxygen, suction and manual treatment of airway obstruction as needed for comfort.      08/15/18 0014        Code Status History    Date Active Date Inactive Code Status Order ID Comments User Context  07/29/2018 1202 08/15/2018 0014 DNR [id number]  Earlie Counts, NP Inpatient   07/13/2018 2315 07/29/2018 1202 Full Code [id number]  Toy Baker, MD Inpatient   06/18/2017 1805 07/08/2017 1426 Full Code [id number]  Flora Lipps Inpatient   06/18/2017 1805 06/18/2017 1805 Full Code [id number]  Flora Lipps Inpatient   06/03/2017 1347 06/18/2017 1749 Full Code [id number]  Luanne Bras, MD Inpatient    05/31/2017 2216 06/03/2017 1347 Full Code [id number]  Jani Gravel, MD Inpatient    Advance Directive Documentation     Most Recent Value  Type of Advance Directive  Healthcare Power of Attorney, Living will  Pre-existing out of facility DNR order (yellow form or pink MOST form)  -  "MOST" Form in Place?  -      Prognosis:  POOR-Days to weeks   Discharge Planning:  To Be Determined Family aware to anticipate hospital death versus transfer to residential hospice.   Care plan was discussed with patient's family and bedside RN.   Thank you for allowing the Palliative Medicine Team to assist in the care of this patient.   Time In: 0945  1420 Time Out: 2549  8264 Total Time 75 min.   70 min.  Prolonged Time Billed  YES      Greater than 50%  of this time was spent counseling and coordinating care related to the above assessment and plan.  Alda Lea, AGPCNP-BC Palliative Medicine Team  Phone: [phone] Fax: [phone] Pager: [phone] Amion: Bjorn Pippin   Please contact Palliative Medicine Team phone at [phone] for questions and concerns.

## 2018-08-15 NOTE — Progress Notes (Signed)
Palliative Medicine RN Note: Symptom check. Daughter at bedside. PAINAD 0.  Patient has acetaminophen and morphine ordered via PEG, and family reports she is still vomiting. Spoke with Dr Domingo Cocking; pt has existing orders for IV morphine and PR acetaminophen, so the per peg orders were stopped.   Discussed at length w/daughter s/s decline and non-verbal signs of distress; she verbalized understanding of both. We further discussed plan for Mrs Mealy. If hospice bed is not available, it would be great if we could find a bed for her on [address]. However, from a palliative standpoint, she is ready to move to hospice as soon as a bed is available. We discussed that we are waiting on a bed at [facility]. Her daughter reports that [facility] is the only facility the patient's husband and partner of 50 years can really get to, and they are unwilling to go to a different facility. PMT supports this, as they have been together 50 years, and Mr Prospero is not coping well with the loss of his wife.  Plan for PMT to follow up tomorrow for continued symptoms management. Provided our contact information to the family.  Marjie Skiff Garner Dullea, RN, BSN, [facility] Palliative Medicine Team 08/15/2018 2:09 PM Office [phone]

## 2018-08-16 MED ORDER — GLYCOPYRROLATE 0.2 MG/ML IJ SOLN: 0.4000 mg | INTRAMUSCULAR | Status: AC | PRN

## 2018-08-16 MED ORDER — MORPHINE SULFATE (PF) 2 MG/ML IV SOLN: 1.0000 mg | INTRAVENOUS | 0 refills | Status: AC | PRN

## 2018-08-16 MED ORDER — SCOPOLAMINE 1 MG/3DAYS TD PT72: 1.0000 | MEDICATED_PATCH | TRANSDERMAL | 12 refills | Status: AC

## 2018-08-16 MED ORDER — SODIUM CHLORIDE 0.9 % IV SOLN: 100.0000 mg | Freq: Two times a day (BID) | INTRAVENOUS | Status: AC

## 2018-08-16 MED ORDER — LORAZEPAM 2 MG/ML IJ SOLN: 1.0000 mg | INTRAMUSCULAR | 0 refills | Status: AC | PRN

## 2018-08-16 MED ORDER — VALPROATE SODIUM 500 MG/5ML IV SOLN: 500.0000 mg | Freq: Two times a day (BID) | INTRAVENOUS | Status: AC

## 2018-08-16 MED ORDER — SODIUM CHLORIDE 0.9 % IV SOLN: 750.0000 mg | Freq: Two times a day (BID) | INTRAVENOUS | Status: AC

## 2018-08-16 NOTE — Progress Notes (Signed)
[facility]. Family at bedside. IV left in per hospice request.

## 2018-08-16 NOTE — Progress Notes (Signed)
Gave report to [facility] RN, Caryl Asp.

## 2018-08-16 NOTE — Progress Notes (Signed)
[facility]: [employer] Met with family they are in agreement to hospice philosophy and care at our [facility]. Spoke to MD and she agrees pt will be appropriate for GIP level of care. Family has signed consents and pt can transfer when MD feels she is ready. [employer] [phone] Hospital Liaison

## 2018-08-16 NOTE — Progress Notes (Signed)
Discharge to: [facility] in [facility] Anticipated discharge date: 08/16/18 Family notified: Yes, at bedside Transportation by: [facility]  Transport set for 1:00 PM.  CSW signing off.  Laveda Abbe LCSW [phone]

## 2018-08-16 NOTE — Discharge Summary (Signed)
Physician Discharge Summary  [name] OAC:[id number] DOB: [birth year] DOA: 07/13/2018  PCP: [name], MD  Admit date: 07/13/2018 Discharge date: 08/16/2018  Admitted From: Home Disposition: Residential Hospice  Recommendations for Outpatient Follow-up:  Follow up with hospice.   Discharge Condition:Hospice  CODE STATUS: Comfort Care Diet recommendation: comfort measures  Brief/Interim Summary: 67 year old F w/ a history of prior CVAs with carotid artery disease, hypertension, hyperlipidemia, normal pressure hydrocephalus status post shunt who was admitted with seizures and a new left MCA territory stroke.Left ICA thrombus.Treated with heparin drip initially due to planned proceduresand antiplatelets.   Had recurrent seizures and transferred to ICU 9/29 for Versed and seizure suppression. Intubated and ventilated 9/29.After extubation, had persistent dysphagia status post cord track placement then later PEG tube placement by interventional radiology on 08/05/2018.  Hospital course complicated by lethargy. Initially on Vimpat, Keppra, Depakote, and gabapentin. Gabapentin stopped on 08/08/2018 as recommended by neurology due to persistent somnolence. Repeated EEG done on 08/08/2018 with no recurrence of seizure. Elevated ammonia level. Started on lactulose. No noted valproic acid toxicity. On 10/25 she underwent Successful bedside expression peri-gastrostomy track abscess/hematoma. 10/27 palliative care discussed with family and transitioned to full comfort care.   Discharge Diagnoses:  Active Problems:   Dysphagia   Acute CVA (cerebrovascular accident) ([place])   Type II diabetes mellitus with complication ([place])   Benign essential HTN   Hypokalemia   Encounter for intubation   CVA (cerebral vascular accident) ([place])   Diabetes mellitus ([place])   Status epilepticus ([place])   SOB (shortness of breath)   Advanced care planning/counseling discussion   Goals of care,  counseling/discussion   Aspiration into airway   Adult failure to thrive   Palliative care encounter   Left hemispheric acute CVA secondary to left ICA stenosis in ICA thrombus Patient unable to tolerate any PEG tube feeds with multiple attempts with water boluses. Family discussed with palliative and transition her to full comfort measures. PT appears comfortable.    Acute metabolic encephalopathy secondary to seizures versus hyper ammonemia.  Neurology reconsulted and recommended seizure medications with Keppra, valproate and Vimpat, changed to IV as she is unable to tolerate anything by PEG .  Stop gabapentin as per recommendations by neurology. As family decided to transition to comfort measures we will start tapering the dose of antiepileptic drugs before finally taking them off.    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  Seizure precautions.  Repeat EEG shows moderate cerebral dysfunction but no epileptiform activity.  Ammonia level is 20 , TSH is within normal limits , vitamin B12 within normal limits.  Type 2 diabetes  last A1c 6.3.  Currently on comfort measures please stop CBG readings.    Anemia of chronic disease No signs of overt bleeding Hemoglobin stable around 8  Severe dysphagia status post PEG tube placement by IR on October 18. Currently tube feeds on hold  Due to persistent vomiting.   Persistent nausea and vomiting appears to have resolved Abdominal x-ray showed adynamic ileus versus gastroenteritis.  It was followed by CT abd and pelvis ordered for further evaluation, did not show any obstruction. Mild enlargement of the left rectus abdominus muscle just inferior to the gastrostomy tube favored to reflect small postprocedural hematoma related to recent placement. Gastrostomy tube appears in appropriate position. IR reconsulted for repositioning of the disc and underwent successful bedside  expression of peri-gastrostomy tract abscess/hematoma.  She was also started on IV Rocephin.,  Discontinue IV  Rocephin as she is being transitioned to comfort measures.  She was given meds via PEG ths am and she started vomiting. Discussed with RN not to give anything via PEG at this time.     Acute kidney injury secondary to dehydration  Resolved with IV hydration.    pneumonitis from possible aspiration  Chest x-ray does not show any overt pneumonia.   Hypokalemia: replaced.    Discharge Instructions  Discharge Instructions    Ambulatory referral to Neurology   Complete by:  As directed    Follow up with Dr. Jaynee Eagles at [facility] in 4 weeks. New stroke, too complicated for RN to follow. Thanks.   Discharge instructions   Complete by:  As directed    Please follow up with hospice as recommended.     Allergies as of 08/16/2018      Reactions   Carbamazepine Hives, Shortness Of Breath, Other (See Comments)   Penicillins Anaphylaxis, Other (See Comments), Swelling   Mother, father and brother have history of anaphylaxis reaction to penicillin so pt does not take My throat swells Familial history of anaphylaxis   Statins Other (See Comments)   Severe muscle weakness, leg numbness, severe headaches, chest pain   Tricyclic Antidepressants Other (See Comments)   IMPAIRED MEMORY   Atorvastatin Other (See Comments)   Severe muscle weakness, leg numbness, severe headaches, chest pain   Ezetimibe Other (See Comments)   Severe muscle weakness, leg numbness, severe headaches, chest pain   Nortriptyline Other (See Comments)   IMPAIRED MEMORY   Quinapril Hcl Other (See Comments)   Unknown reaction   Rifampin Diarrhea   Metoclopramide Nausea And Vomiting, Rash      Medication List    STOP taking these medications   ACCU-CHEK COMPACT PLUS test strip Generic drug:  glucose blood   acetaZOLAMIDE 250 MG tablet Commonly known as:  DIAMOX   busPIRone 5 MG tablet Commonly known  as:  BUSPAR   CARTIA XT 180 MG 24 hr capsule Generic drug:  diltiazem   cloNIDine 0.1 MG tablet Commonly known as:  CATAPRES   clopidogrel 75 MG tablet Commonly known as:  PLAVIX   diclofenac sodium 1 % Gel Commonly known as:  VOLTAREN   esomeprazole 40 MG capsule Commonly known as:  NEXIUM   insulin glargine 100 unit/mL Sopn Commonly known as:  LANTUS   metFORMIN 500 MG (MOD) 24 hr tablet Commonly known as:  GLUMETZA   metoprolol succinate 100 MG 24 hr tablet Commonly known as:  TOPROL-XL   PRESCRIPTION MEDICATION   prochlorperazine 5 MG tablet Commonly known as:  COMPAZINE   ranitidine 75 MG tablet Commonly known as:  ZANTAC   sertraline 50 MG tablet Commonly known as:  ZOLOFT   tiZANidine 4 MG tablet Commonly known as:  ZANAFLEX   topiramate 50 MG tablet Commonly known as:  TOPAMAX   traMADol 50 MG tablet Commonly known as:  ULTRAM   VICTOZA 18 MG/3ML Sopn Generic drug:  liraglutide     TAKE these medications   glycopyrrolate 0.2 MG/ML injection Commonly known as:  ROBINUL Inject 2 mLs (0.4 mg total) into the vein every 2 (two) hours as needed (excessive secretions).   lacosamide 100 mg in sodium chloride 0.9 % 25 mL Inject 100 mg into the vein every 12 (twelve) hours.   levETIRAcetam 750 mg in sodium chloride 0.9 % 100 mL Inject 750 mg into the vein every 12 (twelve) hours.   LORazepam 2 MG/ML injection Commonly known as:  ATIVAN Inject 0.5-1 mLs (1-2 mg total) into the vein every 2 (two) hours as needed for anxiety or seizure.   morphine 2 MG/ML injection Inject 0.5-1 mLs (1-2 mg total) into the vein every 2 (two) hours as needed.   scopolamine 1 MG/3DAYS Commonly known as:  TRANSDERM-SCOP Place 1 patch (1.5 mg total) onto the skin every 3 (three) days. Start taking on:  08/18/2018   valproate 500 mg in dextrose 5 % 50 mL Inject 500 mg into the vein every 12 (twelve) hours.      Follow-up Information    Melvenia Beam, MD. Schedule  an appointment as soon as possible for a visit in 4 week(s).   Specialty:  Neurology Contact information: [address] [place] [address] [phone]          Allergies  Allergen Reactions  . Carbamazepine Hives, Shortness Of Breath and Other (See Comments)  . Penicillins Anaphylaxis, Other (See Comments) and Swelling    Mother, father and brother have history of anaphylaxis reaction to penicillin so pt does not take  My throat swells Familial history of anaphylaxis   . Statins Other (See Comments)    Severe muscle weakness, leg numbness, severe headaches, chest pain   . Tricyclic Antidepressants Other (See Comments)    IMPAIRED MEMORY  . Atorvastatin Other (See Comments)    Severe muscle weakness, leg numbness, severe headaches, chest pain   . Ezetimibe Other (See Comments)    Severe muscle weakness, leg numbness, severe headaches, chest pain   . Nortriptyline Other (See Comments)    IMPAIRED MEMORY  . Quinapril Hcl Other (See Comments)    Unknown reaction  . Rifampin Diarrhea  . Metoclopramide Nausea And Vomiting and Rash    Consultations:  Palliative care  Neurology.    Procedures/Studies: Ct Abdomen Pelvis Wo Contrast  Result Date: 08/11/2018 CLINICAL DATA:  Persistent nausea and vomiting. EXAM: CT ABDOMEN AND PELVIS WITHOUT CONTRAST TECHNIQUE: Multidetector CT imaging of the abdomen and pelvis was performed following the standard protocol without IV contrast. COMPARISON:  CT abdomen pelvis dated Feb 26, 2016. FINDINGS: Lower chest: Left lower lobe atelectasis. Hepatobiliary: No focal liver abnormality is seen. No gallstones, gallbladder wall thickening, or biliary dilatation. Pancreas: Unremarkable. No pancreatic ductal dilatation or surrounding inflammatory changes. Spleen: Normal in size without focal abnormality. Adrenals/Urinary Tract: Adrenal glands are unremarkable. Tiny bilateral punctate renal calculi. No hydronephrosis. Bladder is  unremarkable. Stomach/Bowel: Small hiatal hernia. Gastrostomy tube in place. No bowel wall thickening, distention, or surrounding inflammatory changes. Moderate colonic diverticulosis. Small herniated portion of the anti mesenteric transverse colon at the umbilicus. The appendix is not visualized consistent with history of prior appendectomy. Vascular/Lymphatic: Aortic atherosclerosis. No enlarged abdominal or pelvic lymph nodes. Reproductive: Status post hysterectomy. No adnexal masses. Other: No free fluid or pneumoperitoneum. Unchanged lumbar thecal shunt to the abdominal cavity. Musculoskeletal: Mild enlargement of the left rectus abdominus muscle just inferior to the gastrostomy tube with overlying superficial soft tissue stranding. No acute or significant osseous findings. IMPRESSION: 1.  No acute intra-abdominal process.  No bowel obstruction. 2. Mild enlargement of the left rectus abdominus muscle just inferior to the gastrostomy tube favored to reflect small postprocedural hematoma related to recent placement. Gastrostomy tube appears in appropriate position. 3. Small umbilical hernia containing a portion of the anti mesenteric transverse colon. 4. Bilateral nonobstructive punctate nephrolithiasis. 5.  Aortic atherosclerosis (ICD10-I70.0). Electronically Signed   By: Titus Dubin M.D.   On: 08/11/2018 12:34  Ct Head Wo Contrast  Result Date: 08/12/2018 CLINICAL DATA:  Encephalopathy. Seizures. Hypertension. Vomiting with increased lethargy and headache. EXAM: CT HEAD WITHOUT CONTRAST TECHNIQUE: Contiguous axial images were obtained from the base of the skull through the vertex without intravenous contrast. COMPARISON:  Brain MRI 07/23/2018 FINDINGS: Brain: Confluent hypodensities involving cortex and white matter in the left parietal and posterior left frontal lobes correspond to infarcts of varying age as shown on prior MRI (now subacute and chronic). Small chronic right frontal and parietal  infarcts are again noted. No definite acute infarct, intracranial hemorrhage, mass, midline shift, or extra-axial fluid collection is identified. There is a background of mild cerebral atrophy and moderate chronic small vessel ischemic disease. Chronic lacunar infarcts are noted in the basal ganglia bilaterally. There is a partially empty sella. Vascular: Calcified atherosclerosis at the skull base. No hyperdense vessel. Skull: No fracture or focal osseous lesion. Sinuses/Orbits: Paranasal sinuses and mastoid air cells are clear. Bilateral cataract extraction. Other: None. IMPRESSION: 1. No evidence of acute intracranial abnormality. 2. Subacute and chronic ischemia as above. Electronically Signed   By: Logan Bores M.D.   On: 08/12/2018 14:12   Mr Brain Wo Contrast  Result Date: 07/23/2018 CLINICAL DATA:  67 year old female with right upper extremity tremor. Acute on chronic left MCA infarcts at the end of last month. EXAM: MRI HEAD WITHOUT CONTRAST TECHNIQUE: Multiplanar, multiecho pulse sequences of the brain and surrounding structures were obtained without intravenous contrast. COMPARISON:  Head CT 07/16/2018 and earlier.  Brain MRI 07/13/2018. FINDINGS: Brain: New areas of cortical restricted diffusion in the posterior left MCA and MCA/PCA watershed territory since 07/13/2018. In particular, new involvement of the posterior left insula and inferior left parietal lobes (series 5, images 59 and 63). Expected evolution of the other scattered regional restricted diffusion since September. Combination of chronic encephalomalacia, developing encephalomalacia, and cytotoxic edema in the region. Petechial hemorrhage and hemosiderin has mildly progressed. No malignant hemorrhagic transformation. No significant mass effect. No contralateral right hemisphere or posterior fossa restricted diffusion. No deep gray matter involvement. But there is bilateral Patchy and confluent cerebral white matter T2 and FLAIR  hyperintensity. There is a small area of chronic cortical encephalomalacia in the right inferior frontal gyrus. Hippocampal formations appear symmetric and normal. No midline shift, mass effect, evidence of mass lesion, ventriculomegaly, extra-axial collection. Cervicomedullary junction and pituitary are within normal limits. Vascular: Major intracranial vascular flow voids are stable. Skull and upper cervical spine: Negative. Normal bone marrow signal. Sinuses/Orbits: Stable and negative. Other: Mild new mastoid fluid bilaterally. Small volume retained secretions in the oropharynx on series 10, image 2. The patient is intubated. Negative nasopharynx. Otherwise stable visible internal auditory structures. Scalp and face soft tissues appear negative. IMPRESSION: 1. New patchy acute cortical infarcts in the posterior left MCA and left MCA/PCA watershed territory since 07/13/2018. Underlying subacute and chronic ischemia in the region. Petechial hemorrhage but no malignant hemorrhagic transformation or mass effect. 2. Stable brain parenchyma elsewhere. No other acute intracranial abnormality. 3. New trace mastoid effusions, probably related to intubation. Electronically Signed   By: Genevie Ann M.D.   On: 07/23/2018 12:07   Ir Gastrostomy Tube Mod Sed  Result Date: 08/05/2018 INDICATION: Stroke EXAM: PERC PLACEMENT GASTROSTOMY MEDICATIONS: Vancomycin 1 gm IV; Antibiotics were administered within 1 hour of the procedure. Glucagon 1 mg IV ANESTHESIA/SEDATION: Versed 1 mg IV; Fentanyl 50 mcg IV Moderate Sedation Time:  11 minutes The patient was continuously monitored during the procedure by the interventional radiology  nurse under my direct supervision. CONTRAST:  None-administered into the gastric lumen. FLUOROSCOPY TIME:  Fluoroscopy Time: 2 minutes 42 seconds (6 mGy). COMPLICATIONS: None immediate. PROCEDURE: The procedure, risks, benefits, and alternatives were explained to the patient. Questions regarding the  procedure were encouraged and answered. The patient understands and consents to the procedure. The epigastrium was prepped with Betadine in a sterile fashion, and a sterile drape was applied covering the operative field. A sterile gown and sterile gloves were used for the procedure. A 5-French orogastric tube is placed under fluoroscopic guidance. Scout imaging of the abdomen confirms barium within the transverse colon. The stomach was distended with gas. Under fluoroscopic guidance, an 18 gauge needle was utilized to puncture the anterior wall of the body of the stomach. An Amplatz wire was advanced through the needle passing a T fastener into the lumen of the stomach. The T fastener was secured for gastropexy. A 9-French sheath was inserted. A snare was advanced through the 9-French sheath. A Britta Mccreedy was advanced through the orogastric tube. It was snared then pulled out the oral cavity, pulling the snare, as well. The leading edge of the gastrostomy was attached to the snare. It was then pulled down the esophagus and out the percutaneous site. It was secured in place. Contrast was injected. The image demonstrates placement of a 20-French pull-through type gastrostomy tube into the body of the stomach. IMPRESSION: Successful 20 French pull-through gastrostomy. Electronically Signed   By: Marybelle Killings M.D.   On: 08/05/2018 15:01   Ir Cm Inj Any Colonic Tube W/fluoro  Result Date: 08/12/2018 INDICATION: Nausea and vomiting associated with recent gastrostomy tube feedings. Pull through gastrostomy was placed on 08/05/2018 (Dr.  Barbie Banner). Patient presents now for fluoroscopic guided injection and evaluation. EXAM: FLUOROSCOPIC GUIDED REPLACEMENT OF GASTROSTOMY TUBE COMPARISON:  CT abdomen and pelvis-08/11/2018; pull-through gastrostomy tube placement - 08/05/2018 MEDICATIONS: None. CONTRAST:  50 cc Isovue-300 - administered into the gastric lumen FLUOROSCOPY TIME:  48 seconds (10 mGy) COMPLICATIONS: None immediate.  PROCEDURE: Patient was placed supine on the fluoroscopy table. Visual expect shin of the gastrostomy tube demonstrates the external bumper was very tight against the skin surface. Additionally, there was confirmed this eroded the exit site of the gastrostomy tube which was apparently tender to palpation (patient is largely nonverbal). Contrast injection demonstrates appropriate positioning of the gastrostomy tube with brisk opacification of the gastric lumen. The external disc was loosened within the subsequent brisk expression of a large amount of purulent foul-smelling material. As much foul smelling fluid as possible was attempted to be expressed from the gastrostomy track with manual compression. Next, the gastrostomy tube was further advanced into the more central aspect of the gastric lumen and the external disc was secured at the skin just peripheral to the skin entrance site (at the approximately 8 cm mark of the external portion of the gastrostomy tube) with an interrupted 0 silk suture. Again, small amount of contrast was injected confirming appropriate positioning and without evidence of leak. Several spot fluoroscopic images were obtained in various obliquities to confirm appropriate intraluminal positioning. A dressing was placed. The patient tolerated the procedure well without immediate postprocedural complication. IMPRESSION: 1. Fluoroscopic guided repositioning of disc retention gastrostomy tube with retention disc now located within the more central aspect of the gastric lumen. 2. Successful bedside expression peri-gastrostomy track abscess/hematoma. Electronically Signed   By: Sandi Mariscal M.D.   On: 08/12/2018 13:16   Dg Chest Port 1 View  Result Date: 08/12/2018 CLINICAL  DATA:  67 y/o  F; possible aspiration. EXAM: PORTABLE CHEST 1 VIEW COMPARISON:  08/10/2018 chest radiograph. FINDINGS: Stable cardiac silhouette given projection and technique. Streaky opacities at the lung bases may reflect  bronchitic changes and/or aspiration. No pleural effusion or pneumothorax. Surgical drain projects over the right upper quadrant. No acute osseous abnormality is evident. IMPRESSION: Streaky opacities at the lung bases may reflect bronchitic changes and/or aspiration. Electronically Signed   By: Kristine Garbe M.D.   On: 08/12/2018 14:59   Dg Chest Port 1 View  Result Date: 08/02/2018 CLINICAL DATA:  Vomiting.  Evaluate for aspiration. EXAM: PORTABLE CHEST 1 VIEW COMPARISON:  July 25, 2018 FINDINGS: There is opacity in the lateral left lung base which is mildly improved since July 25, 2018. A new feeding tube is identified, terminating below today's film. No other interval changes. IMPRESSION: Improving lateral left basilar infiltrate. Recommend follow-up to complete resolution. Insertion of a feeding tube, incompletely evaluated on this study. Electronically Signed   By: Dorise Bullion III M.D   On: 08/02/2018 12:02   Dg Chest Port 1 View  Result Date: 07/25/2018 CLINICAL DATA:  Respiratory failure. EXAM: PORTABLE CHEST 1 VIEW COMPARISON:  07/24/2018. FINDINGS: Trachea is midline. Heart size stable. Increasing left basilar volume loss with a left pleural effusion and elevation of the left hemidiaphragm. Right lung is clear. A small bore catheter projects over the lateral right upper quadrant. IMPRESSION: 1. Increasing left basilar atelectasis. Possibility of mucus plugging is considered. Follow-up to clearing is recommended as a centrally obstructing lesion cannot be definitively excluded. 2. Small left pleural effusion. Electronically Signed   By: Lorin Picket M.D.   On: 07/25/2018 07:24   Dg Chest Port 1 View  Result Date: 07/24/2018 CLINICAL DATA:  Abnormal breath sounds. EXAM: PORTABLE CHEST 1 VIEW COMPARISON:  Earlier same day. 07/23/2018, 07/22/2018 and 06/23/2017. FINDINGS: 1629 hours. Low lung volumes. The lungs are clear without focal pneumonia, edema, pneumothorax or pleural  effusion. Similar appearance of retrocardiac streaky opacity suggesting atelectasis. Right hilar prominence is unchanged comparing back to 06/23/2017. The visualized bony structures of the thorax are intact. Telemetry leads overlie the chest. IMPRESSION: Stable exam. Retrocardiac left base atelectasis. No findings to suggest pulmonary edema. Electronically Signed   By: Misty Stanley M.D.   On: 07/24/2018 16:59   Dg Chest Port 1 View  Result Date: 07/24/2018 CLINICAL DATA:  Respiratory failure EXAM: PORTABLE CHEST 1 VIEW COMPARISON:  07/23/2018 FINDINGS: Endotracheal tube and NG tube are unchanged. Heart is borderline in size. Minimal left base atelectasis. Right lung clear. No effusions or acute bony abnormality. IMPRESSION: Left base atelectasis. Electronically Signed   By: Rolm Baptise M.D.   On: 07/24/2018 07:19   Dg Chest Port 1 View  Result Date: 07/23/2018 CLINICAL DATA:  Acute respiratory failure with hypoxia. EXAM: PORTABLE CHEST 1 VIEW COMPARISON:  07/22/2018. FINDINGS: Endotracheal to tip 2 cm above the carina. Nasogastric tube tip and side hole in the stomach. Normal sized heart. Tortuous and partially calcified thoracic aorta. Mild left basilar atelectasis with improvement. Clear right lung. Lower thoracic spine degenerative changes. IMPRESSION: Mild left basilar atelectasis with improvement. Electronically Signed   By: Claudie Revering M.D.   On: 07/23/2018 07:44   Dg Chest Port 1 View  Result Date: 07/22/2018 CLINICAL DATA:  Respiratory failure EXAM: PORTABLE CHEST 1 VIEW COMPARISON:  07/21/2018 FINDINGS: Endotracheal tube retracted. Tip is now 2 cm from the carina. Stable NG tube. Heart is mildly enlarged. Normal vascularity. Bibasilar  atelectasis unchanged. No pneumothorax. IMPRESSION: Endotracheal tube is 2.0 cm above the carina. Bibasilar atelectasis. Electronically Signed   By: Marybelle Killings M.D.   On: 07/22/2018 08:16   Dg Chest Port 1 View  Result Date: 07/21/2018 CLINICAL DATA:   Respiratory failure. EXAM: PORTABLE CHEST 1 VIEW COMPARISON:  07/19/2018. FINDINGS: Patient rotated to the right. Endotracheal tube tip noted just above the carina with its tip directed toward the right mainstem bronchus. Proximal repositioning of approximately 3-4 cm suggested. Bibasilar subsegmental atelectasis again noted. Left base pleural thickening again noted. No pneumothorax. Heart size normal. IMPRESSION: 1. Endotracheal tube tip noted just above the carina with its tip directed toward the right mainstem bronchus. Proximal repositioning of approximately 3- 4 cm suggested. 2. Bibasilar subsegmental atelectasis. Left base pleural thickening consistent scarring again noted. Critical Value/emergent results were called by telephone at the time of interpretation on 07/21/2018 at 8:28 am to nurse [facility], who verbally acknowledged these results. Electronically Signed   By: Marcello Moores  Register   On: 07/21/2018 08:30   Dg Chest Port 1 View  Result Date: 07/19/2018 CLINICAL DATA:  Acute respiratory failure EXAM: PORTABLE CHEST 1 VIEW COMPARISON:  Portable chest x-ray of 07/18/2017 and 07/13/2017 FINDINGS: The tip of the endotracheal tube is approximately 1.7 cm above the carina. The lungs appear relatively well aerated. A small left pleural effusion cannot be excluded and there is some elevation left hemidiaphragm. Heart size is stable and NG tube extends into the stomach. IMPRESSION: 1. Tip of endotracheal tube 1.7 cm above the carina. 2. Mild volume loss at the left lung base with probable small left effusion. Electronically Signed   By: Ivar Drape M.D.   On: 07/19/2018 08:06   Dg Chest Port 1 View  Result Date: 07/18/2018 CLINICAL DATA:  Intubated EXAM: PORTABLE CHEST 1 VIEW COMPARISON:  07/17/2018 FINDINGS: Endotracheal tube is 2 cm above the carina. Small left pleural effusion or pleural thickening, stable. Heart is normal size. No confluent opacities or effusions. IMPRESSION: Suspect small left pleural  effusion or pleural thickening, stable. Electronically Signed   By: Rolm Baptise M.D.   On: 07/18/2018 08:21   Dg Chest Port 1 View  Result Date: 07/17/2018 CLINICAL DATA:  Intubation EXAM: PORTABLE CHEST 1 VIEW COMPARISON:  07/17/2018 FINDINGS: Endotracheal tube with the tip 2 cm above the carina. Nasogastric tube projecting over the stomach. Small left pleural effusion or thickening. No right pleural effusion. No focal consolidation. No pneumothorax. Stable cardiomediastinal silhouette. No acute osseous abnormality. IMPRESSION: 1. Endotracheal tube with the tip 2 cm above the carina. 2. Nasogastric tube projecting over the stomach. 3. Small left pleural effusion or thickening. Electronically Signed   By: Kathreen Devoid   On: 07/17/2018 15:12   Dg Abd Acute W/chest  Result Date: 08/10/2018 CLINICAL DATA:  Nausea, vomiting and diarrhea. EXAM: DG ABDOMEN ACUTE W/ 1V CHEST COMPARISON:  Chest and abdomen radiographs, 08/02/2018. FINDINGS: There is no bowel dilation to suggest obstruction. Scattered air-fluid levels are noted on the decubitus view. This suggests a mild adynamic ileus. There is no free air. Gastrostomy tube projects in the left quadrant in the expected location of the stomach. No evidence renal or ureteral stones. Two catheters project over the right abdomen stable prior exam. Cardiac silhouette is normal in size. No mediastinal masses. Minor left lung base opacity, improved compared to the prior chest radiographs. Residual opacity is likely atelectasis. Lungs otherwise clear. No acute skeletal abnormality. IMPRESSION: 1. No evidence of bowel obstruction or free  air. 2. Scattered bowel air-fluid levels on the decubitus view suggests a mild adynamic ileus or gastroenteritis. 3. Improved left lung base opacity. Residual opacity is likely atelectasis. Electronically Signed   By: [name] M.D.   On: 08/10/2018 12:43   Dg Abd Portable 1v  Result Date: 08/02/2018 CLINICAL DATA:  Vomiting.  EXAM: PORTABLE ABDOMEN - 1 VIEW COMPARISON:  07/25/2018. FINDINGS: Surgical tubing noted over the right abdomen. Feeding tube noted with tip over distal stomach or proximal duodenum. No bowel distention. No acute bony abnormality. IMPRESSION: Feeding tube noted with tip over distal stomach or proximal duodenum. Electronically Signed   By: Marcello Moores  Register   On: 08/02/2018 12:04   Dg Abd Portable 1v  Result Date: 07/25/2018 CLINICAL DATA:  Feeding tube placement. EXAM: PORTABLE ABDOMEN - 1 VIEW COMPARISON:  07/21/2018. FINDINGS: Feeding tube tip in the region of the duodenal bulb. Additional smaller caliber tubing overlying the abdomen. Normal bowel gas pattern. Lumbar and lower thoracic spine degenerative changes. Stable small left pleural effusion. IMPRESSION: 1. Feeding tube tip in the region of the duodenal bulb. 2. Stable small left pleural effusion. Electronically Signed   By: Claudie Revering M.D.   On: 07/25/2018 12:52   Dg Abd Portable 1v  Result Date: 07/21/2018 CLINICAL DATA:  Orogastric tube placement EXAM: PORTABLE ABDOMEN - 1 VIEW COMPARISON:  June 23, 2017. FINDINGS: Orogastric tube tip and side port are in the stomach. Visualized bowel gas pattern unremarkable. No bowel obstruction or free air evident. The endotracheal tube tip is 2.9 cm above the carina. Visualized lungs are clear except for small left pleural effusion. There is aortic atherosclerosis. IMPRESSION: Orogastric tube tip and side port in stomach. Visualized bowel gas pattern unremarkable. Small left pleural effusion. Endotracheal tube as described. There is aortic atherosclerosis. Aortic Atherosclerosis (ICD10-I70.0). Electronically Signed   By: Lowella Grip III M.D.   On: 07/21/2018 12:02   Dg Swallowing Func-speech Pathology  Result Date: 08/03/2018 Objective Swallowing Evaluation: Type of Study: MBS-Modified Barium Swallow Study  Patient Details Name: MERIA CRILLY MRN: [id number] Date of Birth: 1950-11-06 Today's  Date: 08/03/2018 Time: SLP Start Time (ACUTE ONLY): 1323 -SLP Stop Time (ACUTE ONLY): 1345 SLP Time Calculation (min) (ACUTE ONLY): 22 min Past Medical History: Past Medical History: Diagnosis Date . Anemia   takes iron supplement . Anesthesia complication   disorientation due to pseudotumor . Diabetes mellitus   IDDM . Family history of anesthesia complication 57 yrs ago  brother stopped breathing for a minute or two . GERD (gastroesophageal reflux disease)  . Headache(784.0)   due to pseudotumor; daily headache . Hypercholesteremia   unable to tolerate statins . Hypertension   under control with meds., has been on med. x 5 yr. . Peripheral neuropathy  . PONV (postoperative nausea and vomiting)  . 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 Synovitis of ankle 04/2013  left . Wears dentures   full Past Surgical History: Past Surgical History: Procedure Laterality Date . ANKLE ARTHROSCOPY Left 05/18/2013  Procedure: LEFT ANKLE ARTHROSCOPY WITH DEBRIDEMENT,  SUBTALAR OPEN DEBRIDEMENT ;  Surgeon: Wylene Simmer, MD;  Location: [place];  Service: Orthopedics;  Laterality: Left; . APPENDECTOMY    as a child . BALLOON DILATION N/A 07/04/2013  Procedure: BALLOON DILATION;  Surgeon: Garlan Fair, MD;  Location: [facility];  Service: Endoscopy;  Laterality: N/A; . BLADDER SUSPENSION   . BREAST LUMPECTOMY W/  NEEDLE LOCALIZATION Left 05/22/2011 . CAROTID ANGIOGRAPHY Bilateral 06/03/2017  Procedure: Bilateral Carotid Angiography;  Surgeon: Conrad [place], MD;  Location: [facility];  Service: Cardiovascular;  Laterality: Bilateral; . COLONOSCOPY WITH PROPOFOL N/A 05/14/2014  Procedure: COLONOSCOPY WITH PROPOFOL;  Surgeon: Garlan Fair, MD;  Location: [facility];  Service: Endoscopy;  Laterality: N/A; . ESOPHAGEAL DILATION  06/23/2006; 08/05/2004 . ESOPHAGOGASTRODUODENOSCOPY N/A 07/04/2013  Procedure: ESOPHAGOGASTRODUODENOSCOPY (EGD);   Surgeon: Garlan Fair, MD;  Location: [facility];  Service: Endoscopy;  Laterality: N/A; . ESOPHAGOGASTRODUODENOSCOPY (EGD) WITH PROPOFOL N/A 05/14/2014  Procedure: ESOPHAGOGASTRODUODENOSCOPY (EGD) WITH PROPOFOL;  Surgeon: Garlan Fair, MD;  Location: [facility];  Service: Endoscopy;  Laterality: N/A; . IR ANGIO VERTEBRAL SEL SUBCLAVIAN INNOMINATE UNI R MOD SED  06/03/2017 . IR GASTROSTOMY TUBE MOD SED  06/25/2017 . IR GASTROSTOMY TUBE REMOVAL  08/20/2017 . IR PERCUTANEOUS ART THROMBECTOMY/INFUSION INTRACRANIAL INC DIAG ANGIO  06/03/2017 . IR PERCUTANEOUS ART THROMBECTOMY/INFUSION INTRACRANIAL INC DIAG ANGIO  06/03/2017 . IR RADIOLOGIST EVAL & MGMT  09/07/2017 . LUMBAR PERITONEAL SHUNT    x 2 . OVARIAN CYST REMOVAL    age 47 . RADIOLOGY WITH ANESTHESIA N/A 06/03/2017  Procedure: RADIOLOGY WITH ANESTHESIA;  Surgeon: Luanne Bras, MD;  Location: [facility];  Service: Radiology;  Laterality: N/A; . SHUNT REVISION  2007 . TOTAL ABDOMINAL HYSTERECTOMY W/ BILATERAL SALPINGOOPHORECTOMY  1994  age 21s . WRIST SURGERY Left 2014   [name] HPI: Pt is a 67 y.o. female with medical history significant of DM 2, CVA with right sided deficits and dysphagia requiring PEG tube, anemia, GERD, HLD, HTN seizures disorder, chronic refractory headaches, debility, sp PEG and Trach now removed. Admitted with decreased responsiveness. MRI showed acute and subacute infarcts in the left hemisphere in the left MCA distribution raising possibility of embolic source. Note that based on 05/2018 OP SLP therapy documentation, patient tolerating a regular diet, thin liquids. BSE completed 9/26 this admission recomending NPO pending MBS due to inconsistent s/s of aspiration. Pt declined MBS and was started on a trial of full liquids, but then developed seizure activity and was intubated 9/29. Extubated 10/6. MBS 10/8 recommending continue alternative means of nutrition but allowing snacks of purees to increase use of the hyolaryngeam musculature   Subjective: drowsy Assessment / Plan / Recommendation CHL IP CLINICAL IMPRESSIONS 08/03/2018 Clinical Impression Pt's oropharyngeal swallow remains grossly unchanged. She continues to have audible secretions, needing Max cues to expectorate with assistance from SLP using [place]. All liquids tested are aspirated with difficulty clearing from the airway. Although she maintained better airway protection with purees, I do not think she has the endurance or the arousal to consume a full meal tray in a safe manner. I suspect that pt will need a more prolonged period of time before she will be able to sustain herself with a PO diet. Discussed this with her husband, who was present for the study. He admitted that he was disappointed, but acknowledged information presented. Would continue to keep her NPO except for a few bites of puree or pudding thick liquids (nothing thinner than these consistencies) when fully alert (pending additional conversations with palliative care). SLP Visit Diagnosis Dysphagia, oropharyngeal phase (R13.12) Attention and concentration deficit following -- Frontal lobe and executive function deficit following -- Impact on safety and function Moderate aspiration risk;Risk for inadequate nutrition/hydration   CHL IP TREATMENT RECOMMENDATION 08/03/2018 Treatment Recommendations Therapy as outlined in treatment plan below   Prognosis 08/03/2018 Prognosis for Safe Diet Advancement Good Barriers to  Reach Goals -- Barriers/Prognosis Comment -- CHL IP DIET RECOMMENDATION 08/03/2018 SLP Diet Recommendations Alternative means - long-term;Other (Comment) Liquid Administration via Spoon Medication Administration Via alternative means Compensations -- Postural Changes --   CHL IP OTHER RECOMMENDATIONS 08/03/2018 Recommended Consults -- Oral Care Recommendations Oral care QID Other Recommendations Have oral suction available   CHL IP FOLLOW UP RECOMMENDATIONS 08/03/2018 Follow up Recommendations Skilled  Nursing facility   [facility] IP FREQUENCY AND DURATION 08/03/2018 Speech Therapy Frequency (ACUTE ONLY) min 2x/week Treatment Duration 2 weeks      CHL IP ORAL PHASE 08/03/2018 Oral Phase Impaired Oral - Pudding Teaspoon -- Oral - Pudding Cup -- Oral - Honey Teaspoon Weak lingual manipulation;Lingual/palatal residue Oral - Honey Cup -- Oral - Nectar Teaspoon Weak lingual manipulation;Lingual/palatal residue Oral - Nectar Cup -- Oral - Nectar Straw -- Oral - Thin Teaspoon -- Oral - Thin Cup -- Oral - Thin Straw -- Oral - Puree Weak lingual manipulation;Lingual/palatal residue;Delayed oral transit Oral - Mech Soft -- Oral - Regular -- Oral - Multi-Consistency -- Oral - Pill -- Oral Phase - Comment --  CHL IP PHARYNGEAL PHASE 08/03/2018 Pharyngeal Phase Impaired Pharyngeal- Pudding Teaspoon -- Pharyngeal -- Pharyngeal- Pudding Cup -- Pharyngeal -- Pharyngeal- Honey Teaspoon Reduced epiglottic inversion;Reduced anterior laryngeal mobility;Reduced laryngeal elevation;Reduced airway/laryngeal closure;Penetration/Aspiration during swallow Pharyngeal Material enters airway, passes BELOW cords without attempt by patient to eject out (silent aspiration) Pharyngeal- Honey Cup -- Pharyngeal -- Pharyngeal- Nectar Teaspoon Reduced epiglottic inversion;Reduced anterior laryngeal mobility;Reduced laryngeal elevation;Reduced airway/laryngeal closure;Penetration/Aspiration during swallow Pharyngeal Material enters airway, passes BELOW cords without attempt by patient to eject out (silent aspiration) Pharyngeal- Nectar Cup -- Pharyngeal -- Pharyngeal- Nectar Straw -- Pharyngeal -- Pharyngeal- Thin Teaspoon -- Pharyngeal -- Pharyngeal- Thin Cup -- Pharyngeal -- Pharyngeal- Thin Straw -- Pharyngeal -- Pharyngeal- Puree Reduced epiglottic inversion;Reduced anterior laryngeal mobility;Reduced laryngeal elevation;Reduced airway/laryngeal closure;Penetration/Aspiration during swallow Pharyngeal Material enters airway, remains ABOVE vocal cords  then ejected out Pharyngeal- Mechanical Soft -- Pharyngeal -- Pharyngeal- Regular -- Pharyngeal -- Pharyngeal- Multi-consistency -- Pharyngeal -- Pharyngeal- Pill -- Pharyngeal -- Pharyngeal Comment --  CHL IP CERVICAL ESOPHAGEAL PHASE 08/03/2018 Cervical Esophageal Phase WFL Pudding Teaspoon -- Pudding Cup -- Honey Teaspoon -- Honey Cup -- Nectar Teaspoon -- Nectar Cup -- Nectar Straw -- Thin Teaspoon -- Thin Cup -- Thin Straw -- Puree -- Mechanical Soft -- Regular -- Multi-consistency -- Pill -- Cervical Esophageal Comment -- [name] 08/03/2018, 2:14 PM [name], M.A. CCC-SLP Acute Rehabilitation Services Pager [phone] Office [phone]              Dg Swallowing Func-speech Pathology  Result Date: 07/26/2018 Objective Swallowing Evaluation: Type of Study: MBS-Modified Barium Swallow Study  Patient Details Name: DELISHA PEADEN MRN: [id number] Date of Birth: 1950/12/24 Today's Date: 07/26/2018 Time: SLP Start Time (ACUTE ONLY): 1153 -SLP Stop Time (ACUTE ONLY): 1211 SLP Time Calculation (min) (ACUTE ONLY): 18 min Past Medical History: Past Medical History: Diagnosis Date . Anemia   takes iron supplement . Anesthesia complication   disorientation due to pseudotumor . Diabetes mellitus   IDDM . Family history of anesthesia complication 12 yrs ago  brother stopped breathing for a minute or two . GERD (gastroesophageal reflux disease)  . Headache(784.0)   due to pseudotumor; daily headache . Hypercholesteremia   unable to tolerate statins . Hypertension   under control with meds., has been on med. x 5 yr. . Peripheral neuropathy  . PONV (postoperative nausea and vomiting)  . Pseudotumor cerebri  has lumbar peritoneal shunt . 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 Synovitis of ankle 04/2013  left . Wears dentures   full Past Surgical History: Past Surgical History: Procedure Laterality Date . ANKLE ARTHROSCOPY Left 05/18/2013  Procedure: LEFT ANKLE ARTHROSCOPY WITH  DEBRIDEMENT,  SUBTALAR OPEN DEBRIDEMENT ;  Surgeon: Wylene Simmer, MD;  Location: [place];  Service: Orthopedics;  Laterality: Left; . APPENDECTOMY    as a child . BALLOON DILATION N/A 07/04/2013  Procedure: BALLOON DILATION;  Surgeon: Garlan Fair, MD;  Location: [facility];  Service: Endoscopy;  Laterality: N/A; . BLADDER SUSPENSION   . BREAST LUMPECTOMY W/ NEEDLE LOCALIZATION Left 05/22/2011 . CAROTID ANGIOGRAPHY Bilateral 06/03/2017  Procedure: Bilateral Carotid Angiography;  Surgeon: Conrad [place], MD;  Location: [facility];  Service: Cardiovascular;  Laterality: Bilateral; . COLONOSCOPY WITH PROPOFOL N/A 05/14/2014  Procedure: COLONOSCOPY WITH PROPOFOL;  Surgeon: Garlan Fair, MD;  Location: [facility];  Service: Endoscopy;  Laterality: N/A; . ESOPHAGEAL DILATION  06/23/2006; 08/05/2004 . ESOPHAGOGASTRODUODENOSCOPY N/A 07/04/2013  Procedure: ESOPHAGOGASTRODUODENOSCOPY (EGD);  Surgeon: Garlan Fair, MD;  Location: [facility];  Service: Endoscopy;  Laterality: N/A; . ESOPHAGOGASTRODUODENOSCOPY (EGD) WITH PROPOFOL N/A 05/14/2014  Procedure: ESOPHAGOGASTRODUODENOSCOPY (EGD) WITH PROPOFOL;  Surgeon: Garlan Fair, MD;  Location[facility];  Service: Endoscopy;  Laterality: N/A; . IR ANGIO VERTEBRAL SEL SUBCLAVIAN INNOMINATE UNI R MOD SED  06/03/2017 . IR GASTROSTOMY TUBE MOD SED  06/25/2017 . IR GASTROSTOMY TUBE REMOVAL  08/20/2017 . IR PERCUTANEOUS ART THROMBECTOMY/INFUSION INTRACRANIAL INC DIAG ANGIO  06/03/2017 . IR PERCUTANEOUS ART THROMBECTOMY/INFUSION INTRACRANIAL INC DIAG ANGIO  06/03/2017 . IR RADIOLOGIST EVAL & MGMT  09/07/2017 . LUMBAR PERITONEAL SHUNT    x 2 . OVARIAN CYST REMOVAL    age 78 . RADIOLOGY WITH ANESTHESIA N/A 06/03/2017  Procedure: RADIOLOGY WITH ANESTHESIA;  Surgeon: Luanne Bras, MD;  Location: [place];  Service: Radiology;  Laterality: N/A; . SHUNT REVISION  2007 . TOTAL ABDOMINAL HYSTERECTOMY W/ BILATERAL SALPINGOOPHORECTOMY  1994  age 88s . WRIST  SURGERY Left 2014   [name] HPI: Pt is a 67 y.o. female with medical history significant of DM 2, CVA with right sided deficits and dysphagia requiring PEG tube, anemia, GERD, HLD, HTN seizures disorder, chronic refractory headaches, debility, sp PEG and Trach now removed. Admitted with decreased responsiveness. MRI showed acute and subacute infarcts in the left hemisphere in the left MCA distribution raising possibility of embolic source. Note that based on 05/2018 OP SLP therapy documentation, patient tolerating a regular diet, thin liquids. BSE completed 9/26 this admission recomending NPO pending MBS due to inconsistent s/s of aspiration. Pt declined MBS and was started on a trial of full liquids, but then developed seizure activity and was intubated 9/29. Extubated 10/6.  Subjective: drowsy Assessment / Plan / Recommendation CHL IP CLINICAL IMPRESSIONS 07/26/2018 Clinical Impression Pt has a mild oral and moderate pharyngeal dysphagia impacted largely by motor deficits/reduced strength. Lingual manipulation is mildly weak, resulting in decreased cohesion and longer oral transit times especially with purees. Pt also has reduced hyolaryngeal movement, epiglottic inversion, and airway closure, allowing nectar thick and honey thick liquid boluses to enter the laryngeal vestibule even when given small amounts. Also question if they are mixing with secretions given audible wetness. She appears to have either a delayed cough response and/or is coughing on secretions in between boluses, but her cough is not productive enough to clear her airway well. The weight of pureed  boluses helps to mildly increase epiglottic inversion, enough so that she protects her airway well across all boluses of this textures. Recommend continuing alternative means of nutrition but allowing snacks of purees from the floor stock in order to increase use of the hyolaryngeam musculature. Pt may also beenfit from RMT to assist swallow and cough.  Will follow for readiness to start diet versus need for repeat testing as overall alertness and endurance begin to improve. SLP Visit Diagnosis Dysphagia, oropharyngeal phase (R13.12) Attention and concentration deficit following -- Frontal lobe and executive function deficit following -- Impact on safety and function Moderate aspiration risk   CHL IP TREATMENT RECOMMENDATION 07/26/2018 Treatment Recommendations Therapy as outlined in treatment plan below   Prognosis 07/26/2018 Prognosis for Safe Diet Advancement Good Barriers to Reach Goals -- Barriers/Prognosis Comment -- CHL IP DIET RECOMMENDATION 07/26/2018 SLP Diet Recommendations Alternative means - temporary;Other (Comment) Liquid Administration via -- Medication Administration Via alternative means Compensations -- Postural Changes --   CHL IP OTHER RECOMMENDATIONS 07/26/2018 Recommended Consults -- Oral Care Recommendations Oral care QID Other Recommendations --   CHL IP FOLLOW UP RECOMMENDATIONS 07/26/2018 Follow up Recommendations Inpatient Rehab   CHL IP FREQUENCY AND DURATION 07/26/2018 Speech Therapy Frequency (ACUTE ONLY) min 2x/week Treatment Duration 2 weeks      CHL IP ORAL PHASE 07/26/2018 Oral Phase Impaired Oral - Pudding Teaspoon -- Oral - Pudding Cup -- Oral - Honey Teaspoon Weak lingual manipulation Oral - Honey Cup -- Oral - Nectar Teaspoon Weak lingual manipulation;Decreased bolus cohesion Oral - Nectar Cup -- Oral - Nectar Straw -- Oral - Thin Teaspoon -- Oral - Thin Cup -- Oral - Thin Straw -- Oral - Puree Weak lingual manipulation;Decreased bolus cohesion;Delayed oral transit Oral - Mech Soft -- Oral - Regular -- Oral - Multi-Consistency -- Oral - Pill -- Oral Phase - Comment --  CHL IP PHARYNGEAL PHASE 07/26/2018 Pharyngeal Phase Impaired Pharyngeal- Pudding Teaspoon -- Pharyngeal -- Pharyngeal- Pudding Cup -- Pharyngeal -- Pharyngeal- Honey Teaspoon Reduced epiglottic inversion;Reduced anterior laryngeal mobility;Reduced laryngeal  elevation;Reduced airway/laryngeal closure;Penetration/Aspiration during swallow Pharyngeal Material enters airway, CONTACTS cords and not ejected out Pharyngeal- Honey Cup -- Pharyngeal -- Pharyngeal- Nectar Teaspoon Reduced epiglottic inversion;Reduced anterior laryngeal mobility;Reduced laryngeal elevation;Reduced airway/laryngeal closure;Penetration/Aspiration during swallow Pharyngeal Material enters airway, CONTACTS cords and not ejected out Pharyngeal- Nectar Cup -- Pharyngeal -- Pharyngeal- Nectar Straw -- Pharyngeal -- Pharyngeal- Thin Teaspoon -- Pharyngeal -- Pharyngeal- Thin Cup -- Pharyngeal -- Pharyngeal- Thin Straw -- Pharyngeal -- Pharyngeal- Puree Reduced epiglottic inversion;Reduced anterior laryngeal mobility;Reduced laryngeal elevation;Reduced airway/laryngeal closure Pharyngeal -- Pharyngeal- Mechanical Soft -- Pharyngeal -- Pharyngeal- Regular -- Pharyngeal -- Pharyngeal- Multi-consistency -- Pharyngeal -- Pharyngeal- Pill -- Pharyngeal -- Pharyngeal Comment --  CHL IP CERVICAL ESOPHAGEAL PHASE 07/26/2018 Cervical Esophageal Phase WFL Pudding Teaspoon -- Pudding Cup -- Honey Teaspoon -- Honey Cup -- Nectar Teaspoon -- Nectar Cup -- Nectar Straw -- Thin Teaspoon -- Thin Cup -- Thin Straw -- Puree -- Mechanical Soft -- Regular -- Multi-consistency -- Pill -- Cervical Esophageal Comment -- [name] 07/26/2018, 2:10 PM  [name], M.A. CCC-SLP Acute Rehabilitation Services Pager [phone] Office [phone]             [place] Ekg Site Rite  Result Date: 07/18/2018 If [facility] image not attached, placement could not be confirmed due to current cardiac rhythm.   (Echo, Carotid, EGD, Colonoscopy, ERCP)    Subjective: Non responsive.   Discharge Exam: Vitals:   08/15/18 1233 08/15/18 2001  BP:  122/68 122/63  Pulse: 92 87  Resp: 18 17  Temp: 97.8 F (36.6 C) 98 F (36.7 C)  SpO2: 94% 93%   Vitals:   08/14/18 1552 08/14/18 2038 08/15/18 1233 08/15/18 2001  BP:  (!) 161/92 128/64 122/68 122/63  Pulse: 90 91 92 87  Resp: (!) 30 12 18 17   Temp:  98.5 F (36.9 C) 97.8 F (36.6 C) 98 F (36.7 C)  TempSrc:  Oral Oral Oral  SpO2: 96% 99% 94% 93%  Weight:      Height:        General: non responsive.  CVS s1s2,  Lungs diminished at bases.  abd soft     The results of significant diagnostics from this hospitalization (including imaging, microbiology, ancillary and laboratory) are listed below for reference.     Microbiology: No results found for this or any previous visit (from the past 240 hour(s)).   Labs: BNP (last 3 results) No results for input(s): BNP in the last 8760 hours. Basic Metabolic Panel: Recent Labs  Lab 08/10/18 0535 08/11/18 1120 08/12/18 1204 08/14/18 0556  NA 139 140 137 140  K 3.4* 3.4* 3.2* 2.5*  CL 106 105 102 106  CO2 24 24 22  21*  GLUCOSE 107* 113* 106* 109*  BUN 9 7* <5* <5*  CREATININE 0.72 0.76 0.66 0.66  CALCIUM 8.7* 9.1 8.8* 8.4*  MG  --  1.7  --  1.4*  PHOS  --   --   --  2.3*   Liver Function Tests: Recent Labs  Lab 08/11/18 1120 08/14/18 0556  AST 17 17  ALT 11 9  ALKPHOS 91 68  BILITOT 0.4 0.4  PROT 6.3* 5.5*  ALBUMIN 2.3* 1.9*   Recent Labs  Lab 08/11/18 1120  LIPASE 24   Recent Labs  Lab 08/12/18 1914  AMMONIA 20   CBC: Recent Labs  Lab 08/10/18 0535 08/11/18 1120 08/14/18 0556  WBC 13.3* 13.6* 9.5  HGB 8.6* 8.7* 7.9*  HCT 27.9* 28.7* 25.8*  MCV 91.8 90.8 90.8  PLT 270 298 297   Cardiac Enzymes: No results for input(s): CKTOTAL, CKMB, CKMBINDEX, TROPONINI in the last 168 hours. BNP: Invalid input(s): POCBNP CBG: Recent Labs  Lab 08/13/18 2012 08/13/18 2349 08/14/18 0410 08/14/18 0833 08/14/18 1223  GLUCAP 91 90 87 118* 99   D-Dimer No results for input(s): DDIMER in the last 72 hours. Hgb A1c No results for input(s): HGBA1C in the last 72 hours. Lipid Profile No results for input(s): CHOL, HDL, LDLCALC, TRIG, CHOLHDL, LDLDIRECT in the last 72  hours. Thyroid function studies No results for input(s): TSH, T4TOTAL, T3FREE, THYROIDAB in the last 72 hours.  Invalid input(s): FREET3 Anemia work up No results for input(s): VITAMINB12, FOLATE, FERRITIN, TIBC, IRON, RETICCTPCT in the last 72 hours. Urinalysis    Component Value Date/Time   COLORURINE YELLOW 06/20/2017 0642   APPEARANCEUR HAZY (A) 06/20/2017 0642   LABSPEC 1.010 06/20/2017 0642   PHURINE 6.0 06/20/2017 0642   GLUCOSEU 50 (A) 06/20/2017 0642   HGBUR LARGE (A) 06/20/2017 0642   BILIRUBINUR NEGATIVE 06/20/2017 0642   KETONESUR NEGATIVE 06/20/2017 0642   PROTEINUR NEGATIVE 06/20/2017 0642   NITRITE POSITIVE (A) 06/20/2017 0642   LEUKOCYTESUR LARGE (A) 06/20/2017 0642   Sepsis Labs Invalid input(s): PROCALCITONIN,  WBC,  LACTICIDVEN Microbiology No results found for this or any previous visit (from the past 240 hour(s)).   Time coordinating discharge: 32 minutes  SIGNED:   Hosie Poisson, MD  [facility]  08/16/2018, 11:11 AM Pager   If 7PM-7AM, please contact night-coverage [email or web address] Password [email or web address]

## 2018-09-07 ENCOUNTER — Telehealth: Payer: Self-pay | Admitting: Neurology

## 2018-09-07 NOTE — Telephone Encounter (Signed)
[name] called to inform the office the pt has pass away as of 11/2.

## 2018-09-18 DEATH — deceased

## 2018-09-23 ENCOUNTER — Encounter: Payer: Medicare Other | Admitting: Vascular Surgery

## 2018-11-18 ENCOUNTER — Ambulatory Visit: Payer: Medicare Other | Admitting: Adult Health

## 2020-07-18 IMAGING — MR MR HEAD W/O CM
10 of 11 series · 42 of 48 positions shown · non-contrast
Comparison: CT HEAD July 13, 2018 and MRI head June 04, 2017.

CLINICAL DATA: Altered mental status, somnolent. History of stroke
and RIGHT-sided weakness. History of seizures, pseudotumor cerebral
with lumboperitoneal shunt, hypertension, hypercholesterolemia,
diabetes.

EXAM:
MRI HEAD WITHOUT CONTRAST
TECHNIQUE: Multiplanar, multiecho pulse sequences of the brain and surrounding
structures were obtained without intravenous contrast.

[Series 5: DWI · axial · 4.0mm · 0.88mm/px · z∈[-63,+69]mm · 6 of 70 slices shown (1 of 4)]
[im 1/70]
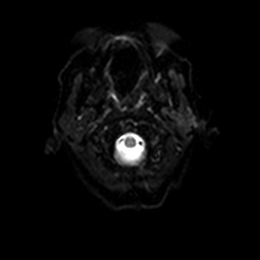
[im 14/70]
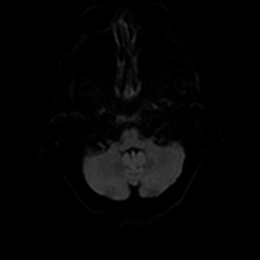
[im 28/70]
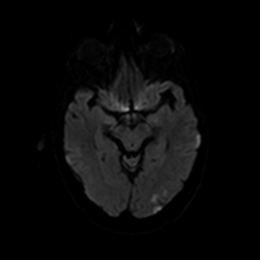
[im 42/70]
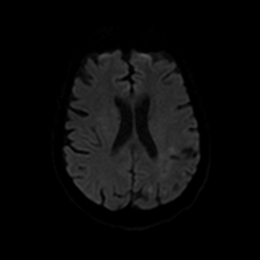
[im 56/70]
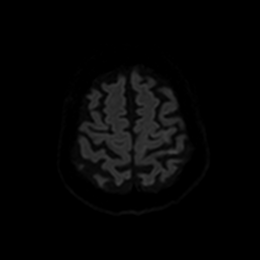
[im 70/70]
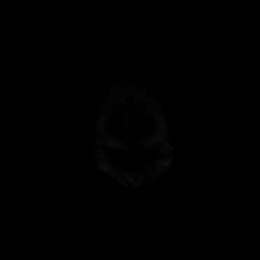

[Series 6: DWI · axial · 4.0mm · 0.88mm/px · z∈[-63,+69]mm · 4 of 35 slices shown (2 of 4)]
[im 1/35]
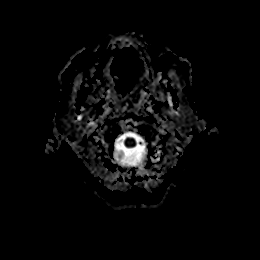
[im 12/35]
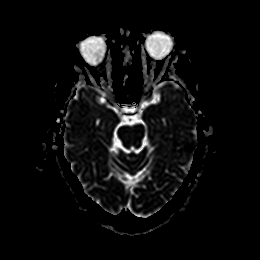
[im 23/35]
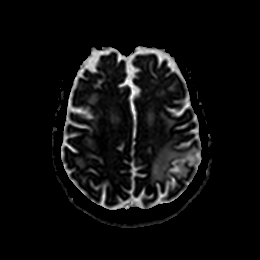
[im 35/35]
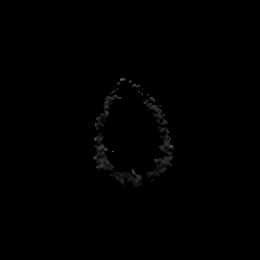

[Series 7: DWI · coronal · 4.0mm · 0.88mm/px · 7 of 64 slices shown (3 of 4)]
[im 1/64]
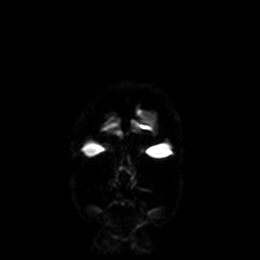
[im 11/64]
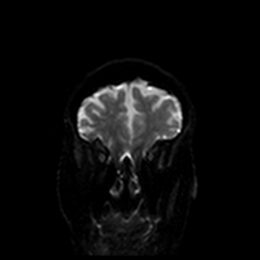
[im 22/64]
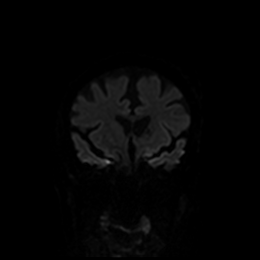
[im 32/64]
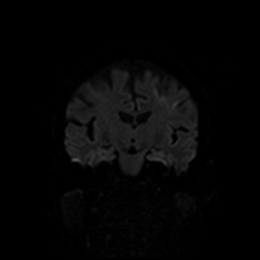
[im 43/64]
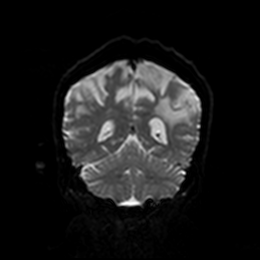
[im 53/64]
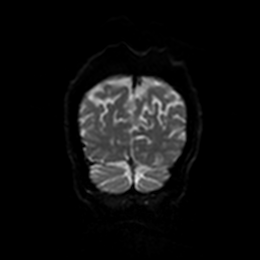
[im 64/64]
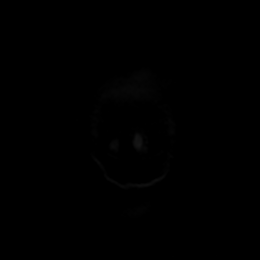

[Series 8: DWI · coronal · 4.0mm · 0.88mm/px · 3 of 32 slices shown (4 of 4)]
[im 1/32]
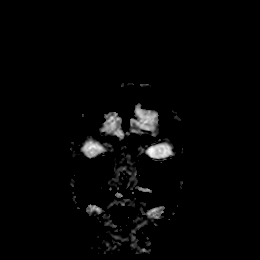
[im 16/32]
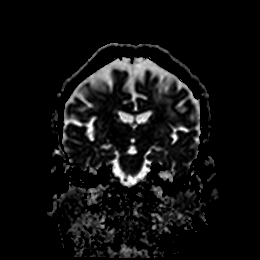
[im 32/32]
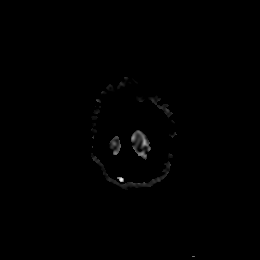

[Series 9: T1 · sagittal · 5.0mm · 0.75mm/px · 2 of 23 slices shown]
[im 1/23]
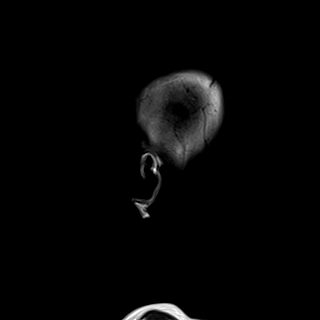
[im 23/23]
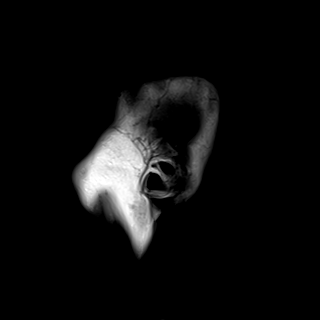

[Series 10: T2 · axial · 5.0mm · 0.72mm/px · z∈[-67,+72]mm · 3 of 25 slices shown (1 of 2)]
[im 1/25]
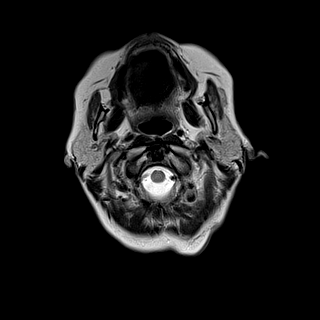
[im 13/25]
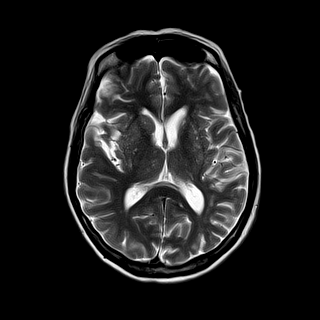
[im 25/25]
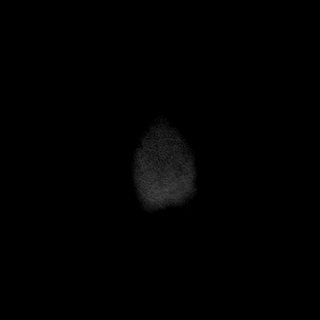

[Series 11: FLAIR · axial · 5.0mm · 0.45mm/px · z∈[-68,+72]mm · 3 of 25 slices shown]
[im 1/25]
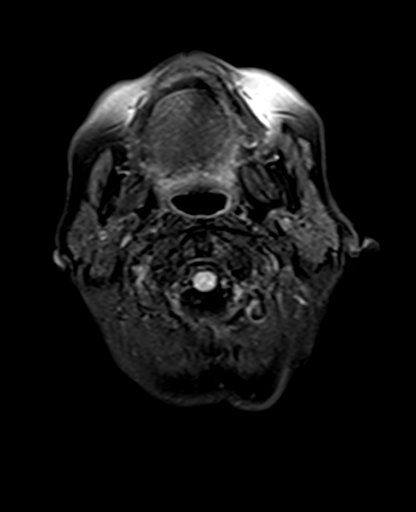
[im 13/25]
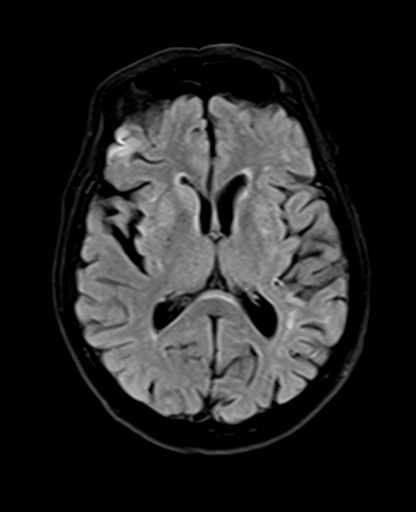
[im 25/25]
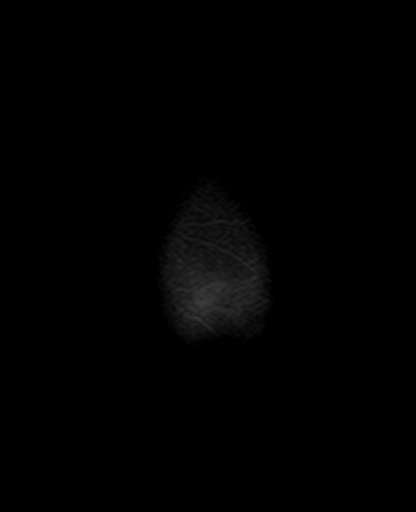

[Series 12: swi_images · axial · 3.0mm · 0.90mm/px · z∈[-84,+64]mm · 6 of 52 slices shown]
[im 1/52]
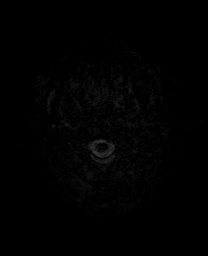
[im 11/52]
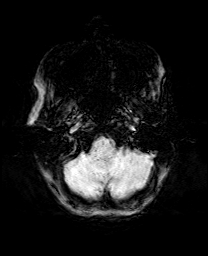
[im 21/52]
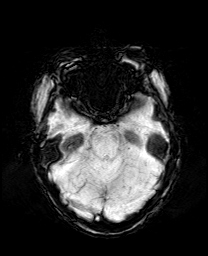
[im 31/52]
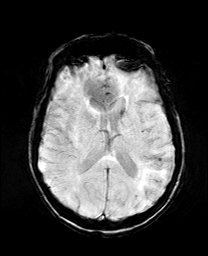
[im 41/52]
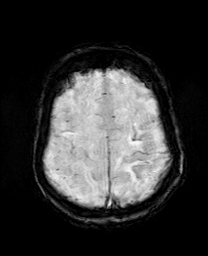
[im 52/52]
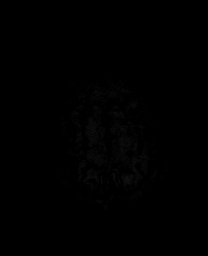

[Series 13: mip_images(sw) · axial · 24.0mm · 0.90mm/px · z∈[-74,+54]mm · 5 of 45 slices shown]
[im 1/45]
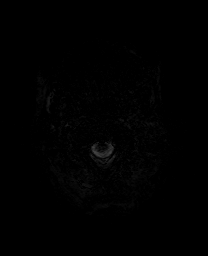
[im 12/45]
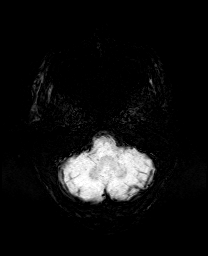
[im 23/45]
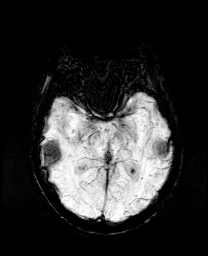
[im 34/45]
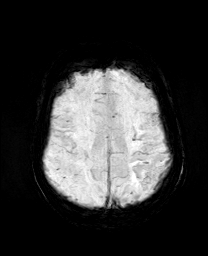
[im 45/45]
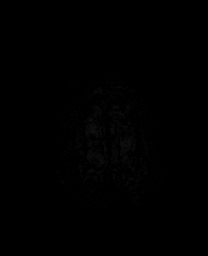

[Series 15: T2 · coronal · 5.0mm · 0.34mm/px · 3 of 29 slices shown (2 of 2)]
[im 1/29]
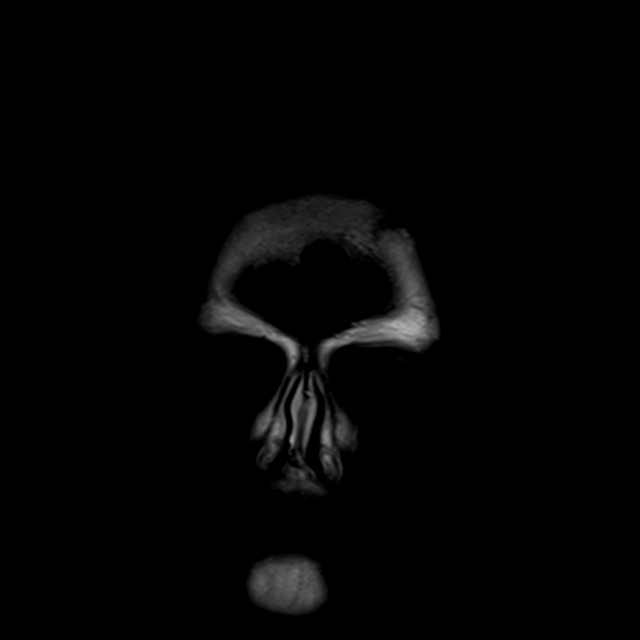
[im 15/29]
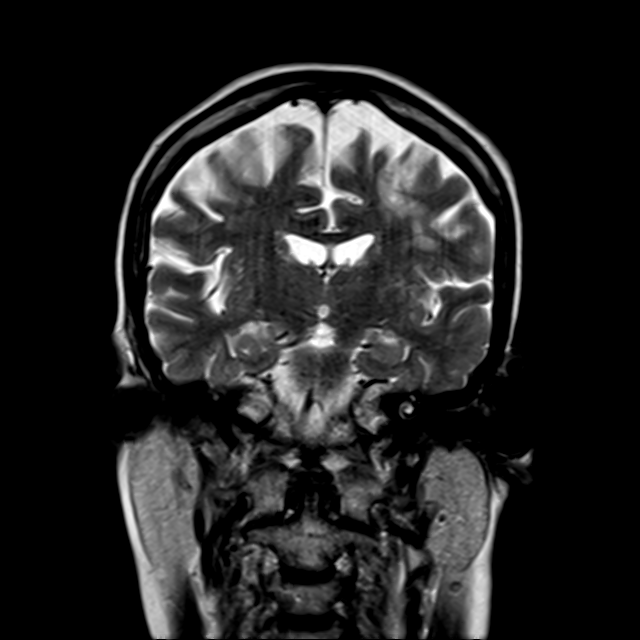
[im 29/29]
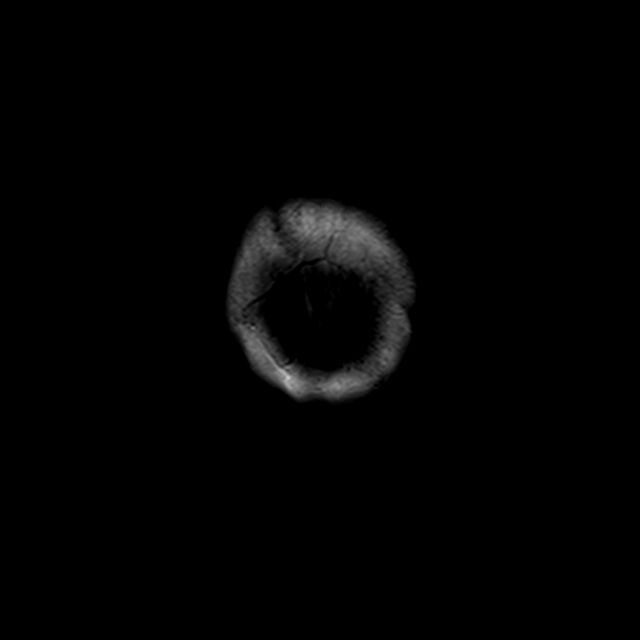

[42 of 48 positions shown; findings below may reference images not displayed]

FINDINGS: INTRACRANIAL CONTENTS: Multifocal reduced diffusion LEFT
frontoparietal lobes with low ADC values. LEFT frontoparietal
encephalomalacia with mild hemosiderin staining and chronic
microhemorrhages. Old bilateral basal ganglia infarcts. Multifocal
small areas RIGHT frontal encephalomalacia. Old small LEFT
cerebellar infarct. Patchy supratentorial and pontine white matter
FLAIR T2 hyperintensities. No midline shift, mass effect or masses.
Mild parenchymal brain volume loss. No hydrocephalus. No abnormal
extra-axial fluid collections.

VASCULAR: Normal major intracranial vascular flow voids present at
skull base.

SKULL AND UPPER CERVICAL SPINE: No abnormal sellar expansion. No
suspicious calvarial bone marrow signal. Craniocervical junction
maintained.

SINUSES/ORBITS: The mastoid air-cells and included paranasal sinuses
are well-aerated.The included ocular globes and orbital contents are
non-suspicious. Status post bilateral ocular lens implants.

OTHER: Patient is edentulous.
IMPRESSION: 1. Acute multifocal LEFT frontoparietal/MCA territory infarcts
2. Old LEFT greater than RIGHT MCA territory infarcts. Old basal
ganglia and cerebellar infarcts.
3. Moderate chronic small vessel ischemic changes.
4. Mild parenchymal brain volume loss.
5. Acute findings discussed with and reconfirmed by Dr.DUCKENS DUENAS
on 07/13/2018 at [DATE].
